# Patient Record
Sex: Male | Born: 1935 | Race: White | Hispanic: No | Marital: Married | Smoking: Former smoker
Health system: Southern US, Community
[De-identification: ages and names within clinical notes are randomized; demographics above are authoritative.]

## PROBLEM LIST (undated history)

## (undated) DIAGNOSIS — R252 Cramp and spasm: Secondary | ICD-10-CM

## (undated) DIAGNOSIS — R600 Localized edema: Secondary | ICD-10-CM

## (undated) DIAGNOSIS — R197 Diarrhea, unspecified: Secondary | ICD-10-CM

## (undated) DIAGNOSIS — N400 Enlarged prostate without lower urinary tract symptoms: Secondary | ICD-10-CM

## (undated) DIAGNOSIS — R42 Dizziness and giddiness: Secondary | ICD-10-CM

## (undated) DIAGNOSIS — Z87440 Personal history of urinary (tract) infections: Secondary | ICD-10-CM

## (undated) DIAGNOSIS — I4819 Other persistent atrial fibrillation: Secondary | ICD-10-CM

## (undated) DIAGNOSIS — M1990 Unspecified osteoarthritis, unspecified site: Secondary | ICD-10-CM

## (undated) DIAGNOSIS — I10 Essential (primary) hypertension: Secondary | ICD-10-CM

## (undated) DIAGNOSIS — D649 Anemia, unspecified: Secondary | ICD-10-CM

## (undated) DIAGNOSIS — M545 Low back pain, unspecified: Secondary | ICD-10-CM

## (undated) DIAGNOSIS — K922 Gastrointestinal hemorrhage, unspecified: Secondary | ICD-10-CM

## (undated) DIAGNOSIS — J189 Pneumonia, unspecified organism: Secondary | ICD-10-CM

## (undated) DIAGNOSIS — R3911 Hesitancy of micturition: Secondary | ICD-10-CM

## (undated) DIAGNOSIS — I499 Cardiac arrhythmia, unspecified: Secondary | ICD-10-CM

## (undated) DIAGNOSIS — C61 Malignant neoplasm of prostate: Secondary | ICD-10-CM

## (undated) DIAGNOSIS — R079 Chest pain, unspecified: Secondary | ICD-10-CM

## (undated) DIAGNOSIS — Z9289 Personal history of other medical treatment: Secondary | ICD-10-CM

## (undated) DIAGNOSIS — G629 Polyneuropathy, unspecified: Secondary | ICD-10-CM

## (undated) DIAGNOSIS — IMO0001 Reserved for inherently not codable concepts without codable children: Secondary | ICD-10-CM

## (undated) DIAGNOSIS — G4733 Obstructive sleep apnea (adult) (pediatric): Secondary | ICD-10-CM

## (undated) DIAGNOSIS — Z87442 Personal history of urinary calculi: Secondary | ICD-10-CM

## (undated) DIAGNOSIS — K219 Gastro-esophageal reflux disease without esophagitis: Secondary | ICD-10-CM

## (undated) DIAGNOSIS — I639 Cerebral infarction, unspecified: Secondary | ICD-10-CM

## (undated) HISTORY — PX: COLONOSCOPY: SHX174

## (undated) HISTORY — DX: Other persistent atrial fibrillation: I48.19

## (undated) HISTORY — DX: Benign prostatic hyperplasia without lower urinary tract symptoms: N40.0

## (undated) HISTORY — DX: Dizziness and giddiness: R42

## (undated) HISTORY — PX: VAGOTOMY AND PYLOROPLASTY: SHX831

## (undated) HISTORY — DX: Cerebral infarction, unspecified: I63.9

## (undated) HISTORY — DX: Anemia, unspecified: D64.9

## (undated) HISTORY — DX: Obstructive sleep apnea (adult) (pediatric): G47.33

## (undated) HISTORY — DX: Gastrointestinal hemorrhage, unspecified: K92.2

## (undated) HISTORY — DX: Low back pain, unspecified: M54.50

## (undated) HISTORY — DX: Chest pain, unspecified: R07.9

## (undated) HISTORY — PX: INGUINAL HERNIA REPAIR: SHX194

## (undated) HISTORY — DX: Essential (primary) hypertension: I10

## (undated) HISTORY — DX: Diarrhea, unspecified: R19.7

## (undated) HISTORY — DX: Low back pain: M54.5

## (undated) HISTORY — PX: CHOLECYSTECTOMY: SHX55

## (undated) HISTORY — PX: OTHER SURGICAL HISTORY: SHX169

---

## 1983-08-22 DIAGNOSIS — K922 Gastrointestinal hemorrhage, unspecified: Secondary | ICD-10-CM

## 1983-08-22 HISTORY — DX: Gastrointestinal hemorrhage, unspecified: K92.2

## 1989-08-21 HISTORY — PX: ROTATOR CUFF REPAIR: SHX139

## 2000-09-04 ENCOUNTER — Encounter: Admission: RE | Admit: 2000-09-04 | Discharge: 2000-09-04 | Payer: Self-pay | Admitting: Orthopaedic Surgery

## 2000-12-24 ENCOUNTER — Ambulatory Visit (HOSPITAL_COMMUNITY): Admission: RE | Admit: 2000-12-24 | Discharge: 2000-12-24 | Payer: Self-pay | Admitting: Pulmonary Disease

## 2002-02-03 ENCOUNTER — Encounter (HOSPITAL_COMMUNITY): Admission: RE | Admit: 2002-02-03 | Discharge: 2002-03-05 | Payer: Self-pay | Admitting: Pulmonary Disease

## 2002-03-07 ENCOUNTER — Ambulatory Visit (HOSPITAL_COMMUNITY): Admission: RE | Admit: 2002-03-07 | Discharge: 2002-03-07 | Payer: Self-pay | Admitting: Pulmonary Disease

## 2002-10-03 ENCOUNTER — Ambulatory Visit (HOSPITAL_COMMUNITY): Admission: RE | Admit: 2002-10-03 | Discharge: 2002-10-03 | Payer: Self-pay | Admitting: Pulmonary Disease

## 2003-02-12 ENCOUNTER — Encounter (HOSPITAL_COMMUNITY): Admission: RE | Admit: 2003-02-12 | Discharge: 2003-03-14 | Payer: Self-pay | Admitting: Pulmonary Disease

## 2003-03-06 ENCOUNTER — Ambulatory Visit (HOSPITAL_COMMUNITY): Admission: RE | Admit: 2003-03-06 | Discharge: 2003-03-06 | Payer: Self-pay | Admitting: *Deleted

## 2003-03-18 ENCOUNTER — Ambulatory Visit (HOSPITAL_COMMUNITY): Admission: RE | Admit: 2003-03-18 | Discharge: 2003-03-18 | Payer: Self-pay | Admitting: *Deleted

## 2003-07-21 ENCOUNTER — Emergency Department (HOSPITAL_COMMUNITY): Admission: AD | Admit: 2003-07-21 | Discharge: 2003-07-21 | Payer: Self-pay | Admitting: Family Medicine

## 2004-05-05 ENCOUNTER — Emergency Department (HOSPITAL_COMMUNITY): Admission: EM | Admit: 2004-05-05 | Discharge: 2004-05-05 | Payer: Self-pay | Admitting: Emergency Medicine

## 2004-09-16 ENCOUNTER — Emergency Department (HOSPITAL_COMMUNITY): Admission: EM | Admit: 2004-09-16 | Discharge: 2004-09-16 | Payer: Self-pay | Admitting: Emergency Medicine

## 2007-02-03 ENCOUNTER — Emergency Department (HOSPITAL_COMMUNITY): Admission: EM | Admit: 2007-02-03 | Discharge: 2007-02-03 | Payer: Self-pay | Admitting: Emergency Medicine

## 2007-09-01 ENCOUNTER — Emergency Department (HOSPITAL_COMMUNITY): Admission: EM | Admit: 2007-09-01 | Discharge: 2007-09-01 | Payer: Self-pay | Admitting: Emergency Medicine

## 2007-09-04 ENCOUNTER — Emergency Department (HOSPITAL_COMMUNITY): Admission: EM | Admit: 2007-09-04 | Discharge: 2007-09-04 | Payer: Self-pay | Admitting: Emergency Medicine

## 2009-05-15 ENCOUNTER — Ambulatory Visit: Payer: Self-pay | Admitting: Cardiology

## 2009-05-15 ENCOUNTER — Inpatient Hospital Stay (HOSPITAL_COMMUNITY): Admission: EM | Admit: 2009-05-15 | Discharge: 2009-05-19 | Payer: Self-pay | Admitting: Emergency Medicine

## 2009-06-10 ENCOUNTER — Ambulatory Visit: Payer: Self-pay | Admitting: Gastroenterology

## 2009-06-10 DIAGNOSIS — R197 Diarrhea, unspecified: Secondary | ICD-10-CM

## 2009-06-10 DIAGNOSIS — Z8719 Personal history of other diseases of the digestive system: Secondary | ICD-10-CM | POA: Insufficient documentation

## 2009-06-10 DIAGNOSIS — Z8711 Personal history of peptic ulcer disease: Secondary | ICD-10-CM | POA: Insufficient documentation

## 2009-06-10 DIAGNOSIS — K921 Melena: Secondary | ICD-10-CM | POA: Insufficient documentation

## 2009-06-10 DIAGNOSIS — D649 Anemia, unspecified: Secondary | ICD-10-CM | POA: Insufficient documentation

## 2009-06-10 DIAGNOSIS — I10 Essential (primary) hypertension: Secondary | ICD-10-CM

## 2009-06-10 DIAGNOSIS — Z87898 Personal history of other specified conditions: Secondary | ICD-10-CM

## 2009-06-10 DIAGNOSIS — M545 Low back pain: Secondary | ICD-10-CM

## 2009-06-11 ENCOUNTER — Encounter: Payer: Self-pay | Admitting: Gastroenterology

## 2009-06-17 ENCOUNTER — Encounter: Payer: Self-pay | Admitting: Gastroenterology

## 2009-06-17 ENCOUNTER — Telehealth: Payer: Self-pay | Admitting: Gastroenterology

## 2009-06-17 ENCOUNTER — Ambulatory Visit (HOSPITAL_COMMUNITY): Admission: RE | Admit: 2009-06-17 | Discharge: 2009-06-17 | Payer: Self-pay | Admitting: Gastroenterology

## 2009-06-17 ENCOUNTER — Ambulatory Visit: Payer: Self-pay | Admitting: Gastroenterology

## 2009-06-17 HISTORY — PX: ESOPHAGOGASTRODUODENOSCOPY: SHX1529

## 2009-06-17 HISTORY — PX: COLONOSCOPY WITH ESOPHAGOGASTRODUODENOSCOPY (EGD): SHX5779

## 2009-06-22 ENCOUNTER — Encounter: Payer: Self-pay | Admitting: Gastroenterology

## 2010-06-16 ENCOUNTER — Encounter: Payer: Self-pay | Admitting: Orthopedic Surgery

## 2010-06-16 ENCOUNTER — Ambulatory Visit (HOSPITAL_COMMUNITY)
Admission: RE | Admit: 2010-06-16 | Discharge: 2010-06-16 | Payer: Self-pay | Source: Home / Self Care | Admitting: Orthopaedic Surgery

## 2010-08-30 ENCOUNTER — Encounter: Payer: Self-pay | Admitting: Orthopedic Surgery

## 2010-08-31 ENCOUNTER — Ambulatory Visit
Admission: RE | Admit: 2010-08-31 | Discharge: 2010-08-31 | Payer: Self-pay | Source: Home / Self Care | Attending: Orthopedic Surgery | Admitting: Orthopedic Surgery

## 2010-08-31 DIAGNOSIS — M75120 Complete rotator cuff tear or rupture of unspecified shoulder, not specified as traumatic: Secondary | ICD-10-CM | POA: Insufficient documentation

## 2010-09-06 ENCOUNTER — Encounter (HOSPITAL_COMMUNITY)
Admission: RE | Admit: 2010-09-06 | Discharge: 2010-09-20 | Payer: Self-pay | Source: Home / Self Care | Attending: Orthopedic Surgery | Admitting: Orthopedic Surgery

## 2010-09-22 NOTE — Letter (Signed)
Summary: History form  History form   Imported By: Ruffin Pyo 09/07/2010 09:38:19  _____________________________________________________________________  External Attachment:    Type:   Image     Comment:   External Document

## 2010-09-22 NOTE — Assessment & Plan Note (Signed)
Summary: RT SHOULDER PAIN/S/P FALL 1 M/NEEDS XR/[facility] [employer] [facility]/CAF   Vital Signs:  Patient profile:   77 year old male Height:      72 inches Weight:      260 pounds Pulse rate:   80 / minute Resp:     18 per minute  Vitals Entered By: Arther Abbott MD (August 31, 2010 3:41 PM)  Visit Type:  new patient Referring Provider:  ap [facility] Primary Provider:  Dr. Susann Givens  CC:  right shoulder pain.  History of Present Illness: I saw Bernard Ramirez in the office today for an initial visit.  He is a 77 years old man with the complaint of:  right shoulder pain.  Fell 06/09/10.  No xrays.  Has had Rotator cuff repair on this shoulder before [facility], recovered fully. Also had left shoulder surgery.  MRI right shoulder taken [facility] 06/16/10 for review.  Meds: Vicodin 5 for pain.  He reinjured himself in October and was seen by a local orthopedist, who got an MRI of his shoulder, and it showed that he had a subscapularis tear with retraction, as well as a supraspinatus tear with supraspinatus atrophy.  He complains it is hard for him to shift his tears, although is able to drive his 18 wheeler, and it's hard for him to get as close he can deal with that.     Allergies: 1)  ! Asa 2)  ! 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 Quit 2004. 25PKYR hx Alcohol Use - no Illicit Drug Use - no 2 coffees per day 10th grade ed Patient does not get regular exercise.   Review of Systems Constitutional:  Denies weight loss, weight gain, fever, chills, and fatigue. Cardiovascular:  Denies chest pain, palpitations, fainting, and murmurs. Respiratory:  Denies short of breath, wheezing, couch, tightness, pain on inspiration, and snoring . Gastrointestinal:  Denies heartburn, nausea, vomiting, diarrhea, constipation, and blood in your stools. Genitourinary:  Denies frequency, urgency, difficulty  urinating, painful urination, flank pain, and bleeding in urine. Neurologic:  Denies numbness, tingling, unsteady gait, dizziness, tremors, and seizure. Musculoskeletal:  Denies joint pain, swelling, instability, stiffness, redness, heat, and muscle pain. Endocrine:  Denies excessive thirst, exessive urination, and heat or cold intolerance. Psychiatric:  Denies nervousness, depression, anxiety, and hallucinations. Skin:  Denies changes in the skin, poor healing, rash, itching, and redness. HEENT:  Denies blurred or double vision, eye pain, redness, and watering. Immunology:  Denies seasonal allergies, sinus problems, and allergic to bee stings. Hemoatologic:  Denies easy bleeding and brusing.  Physical Exam  Skin:  intact without lesions or rashes Cervical Nodes:  no significant adenopathy Psych:  alert and cooperative; normal mood and affect; normal attention span and concentration   Shoulder/Elbow Exam  General:    Well-developed, well-nourished, normal body habitus; no deformities, normal grooming.    Skin:    Intact, no scars, lesions, rashes, cafe au lait spots or bruising.    Inspection:    Inspection is normal.    Palpation:    his primary tenderness is over the anterior lateral deltoid and anterior shoulder joint  Vascular:    Radial, ulnar, brachial, and axillary pulses 2+ and symmetric; capillary refill less than 2 seconds; no evidence of ischemia, clubbing, or cyanosis.    Sensory:    Gross sensation intact in the upper extremities.    Motor:  he has weakness in internal rotation, as well as forward elevation on the RIGHT side  Reflexes:    Normal reflexes in the upper extremities.    Shoulder Exam:    Right:    Stability:  stable    Inspection/Palpation:  active range of motion equals flexion of 110  External rotation 45    Left:    Inspection:  Normal    Palpation:  Normal    Stability:  stable   Impression & Recommendations:  Problem # 1:   ROTATOR CUFF TEAR (ICD-727.61) Assessment New  The MRI was done at [facility] and it was reviewed with the report  he does have atrophy of the supraspinatus. I think he has had that tear for quite a while. The subscapularis tear seems to be more subacute and may have occurred in October.  However, he is functioning at this time, and I have advised him to go to therapy because the tear is now old and he does not have a good chance of getting a good functional shoulder with surgery,.  We'll try therapy 1st and if he does well we can stop at that point.  they are agreeable to this plan  Orders: Physical Therapy Referral (PT) New Patient Level III [id number]) Shoulder x-ray,  minimum 2 views [id number]) RIGHT shoulder Separate and Identifiable X-Ray report      Normal glenohumeral joint space. Large subacromial spur.  Impression large acromial spur with normal glenohumeral joint. No bone abnormalities and no acute process seen.  Patient Instructions: 1)  Subscapularous tear and RCT needs PT  2)  early transition to home program 3)  Come back in 6 weeks   Orders Added: 1)  Physical Therapy Referral [PT] 2)  New Patient Level III [id number] 3)  Shoulder x-ray,  minimum 2 views [id number]

## 2010-09-28 ENCOUNTER — Encounter: Payer: Self-pay | Admitting: Orthopedic Surgery

## 2010-10-06 NOTE — Miscellaneous (Signed)
Summary: OT clinical evaluation  OT clinical evaluation   Imported By: Ruffin Pyo 09/28/2010 15:06:47  _____________________________________________________________________  External Attachment:    Type:   Image     Comment:   External Document

## 2010-10-12 ENCOUNTER — Ambulatory Visit: Payer: Self-pay | Admitting: Orthopedic Surgery

## 2010-10-27 ENCOUNTER — Encounter: Payer: Self-pay | Admitting: Orthopedic Surgery

## 2010-11-08 NOTE — Miscellaneous (Signed)
Summary: OT now show  OT now show   Imported By: Ruffin Pyo 11/02/2010 08:00:34  _____________________________________________________________________  External Attachment:    Type:   Image     Comment:   External Document

## 2010-11-24 LAB — TSH: TSH: 2.995 u[IU]/mL (ref 0.350–4.500)

## 2010-11-24 LAB — BASIC METABOLIC PANEL
Calcium: 9 mg/dL (ref 8.4–10.5)
Creatinine, Ser: 0.84 mg/dL (ref 0.4–1.5)
GFR calc Af Amer: >60 mL/min (ref >60)
Sodium: 136 mEq/L (ref 135–145)

## 2010-11-25 LAB — CBC
HCT: 31.5 % — ABNORMAL LOW (ref 39.0–52.0)
Hemoglobin: 10.2 g/dL — ABNORMAL LOW (ref 13.0–17.0)
MCHC: 32.3 g/dL (ref 30.0–36.0)
MCV: 77 fL — ABNORMAL LOW (ref 78.0–100.0)
RBC: 4.67 MIL/uL (ref 4.22–5.81)
RDW: 16.4 % — ABNORMAL HIGH (ref 11.5–15.5)
WBC: 8.1 10*3/uL (ref 4.0–10.5)

## 2010-11-25 LAB — LIPID PANEL
Cholesterol: 179 mg/dL (ref 0–200)
LDL Cholesterol: 107 mg/dL — ABNORMAL HIGH (ref 0–99)

## 2010-11-25 LAB — COMPREHENSIVE METABOLIC PANEL
ALT: 17 U/L (ref 0–53)
AST: 23 U/L (ref 0–37)
Alkaline Phosphatase: 91 U/L (ref 39–117)
CO2: 27 mEq/L (ref 19–32)
Chloride: 104 mEq/L (ref 96–112)
GFR calc Af Amer: >60 mL/min (ref >60)
GFR calc non Af Amer: >60 mL/min (ref >60)
Potassium: 3.8 mEq/L (ref 3.5–5.1)
Sodium: 139 mEq/L (ref 135–145)
Total Bilirubin: 0.5 mg/dL (ref 0.3–1.2)

## 2010-11-25 LAB — HEPATIC FUNCTION PANEL
ALT: 14 U/L (ref 0–53)
AST: 18 U/L (ref 0–37)
Albumin: 3 g/dL — ABNORMAL LOW (ref 3.5–5.2)
Bilirubin, Direct: <0.1 mg/dL (ref 0.0–0.3)
Bilirubin, Direct: <0.1 mg/dL (ref 0.0–0.3)
Indirect Bilirubin: 0.4 mg/dL (ref 0.3–0.9)
Total Bilirubin: 0.4 mg/dL (ref 0.3–1.2)
Total Bilirubin: 0.5 mg/dL (ref 0.3–1.2)

## 2010-11-25 LAB — DIFFERENTIAL
Basophils Absolute: 0 10*3/uL (ref 0.0–0.1)
Basophils Relative: 0 % (ref 0–1)
Eosinophils Absolute: 0.1 10*3/uL (ref 0.0–0.7)
Eosinophils Relative: 2 % (ref 0–5)
Eosinophils Relative: 3 % (ref 0–5)
Lymphs Abs: 1.3 10*3/uL (ref 0.7–4.0)
Monocytes Absolute: 0.7 10*3/uL (ref 0.1–1.0)

## 2010-11-25 LAB — POCT CARDIAC MARKERS: Troponin i, poc: <0.05 ng/mL (ref 0.00–0.09)

## 2010-11-25 LAB — CARDIAC PANEL(CRET KIN+CKTOT+MB+TROPI)
CK, MB: 1.2 ng/mL (ref 0.3–4.0)
Troponin I: 0.02 ng/mL (ref 0.00–0.06)

## 2011-01-06 NOTE — Cardiovascular Report (Signed)
NAME:  Bernard Ramirez, Bernard Ramirez NO.:  [id number]   MEDICAL RECORD NO.:  [id number]                   PATIENT TYPE:  OIB   LOCATION:  2899                                 FACILITY:  [facility]   PHYSICIAN:  Scarlett Presto, M.D.                DATE OF BIRTH:  1930-11-15   DATE OF PROCEDURE:  03/18/2003  DATE OF DISCHARGE:                              CARDIAC CATHETERIZATION   PRIMARY CARE Tieisha Darden:  Jasper Loser. Luan Pulling, M.D. in [place], [place]   HISTORY OF PRESENT ILLNESS:  Mr. Salvia is a 77 year old gentleman with  significant cardiac risk factors of hypertension and hyperlipidemia, distant  tobacco history who presents to see me after having an outpatient perfusion  study which showed evidence of inferior wall scar, and he was referred for  elective heart catheterization via the radial approach.   PROCEDURE(S) PERFORMED:  1. Left heart catheterization.  2. Selective coronary angiography.  3. Left ventriculography, all via the right radial approach.   DETAILS OF PROCEDURE:  Following obtaining informed consent, the patient was  brought to the cardiac catheterization laboratory in the fasting state.  There he was prepped and draped in the usual sterile manner, and the right  wrist was anesthetized using 1% lidocaine without epinephrine.  The right  radial artery was cannulated using modified Seldinger technique with a 6-  French 10 cm sheath and left heart catheterization was performed using a 6-  French Judkins left #4, a 6-French Judkins right #4 and a pigtail catheter.  Left ventriculography was performed in the 30 degree RAO view.  At the  conclusion of the procedure, the catheters were removed, the sheath was  removed.  The Radstat device was placed on the wrist and distal perfusion  was assessed using pulse oximetry.  There were no complications.   RESULTS:  Aortic pressure:  161/93 with a mean arterial pressure of 121  mmHg.  Left ventricular pressure:   161/16 with an end-diastolic pressure of 28  mmHg.   SELECTIVE CORONARY ANGIOGRAPHY:  LEFT MAIN CORONARY ARTERY:  The left main  coronary artery is a moderate to large caliber vessel which is  angiographically normal.  CIRCUMFLEX CORONARY ARTERY:  The circumflex coronary artery is a large  dominant vessel which has five obtuse marginals, a posterior descending  coronary artery and a posterior lateral branch all of which are moderate  caliber and are angiographically free of disease.  LEFT ANTERIOR DESCENDING CORONARY ARTERY:  The left anterior descending  coronary artery is a large transapical vessel with four diagonal branches,  two of which are moderate in caliber and two of which are small.  The LAD is  completely free of disease.  RIGHT CORONARY ARTERY:  The right coronary artery is a small, nondominant  artery with a 25% stenosis in its proximal portion.  LEFT VENTRICULOGRAM:  The left ventriculogram reveals normal left  ventricular function with an estimated ejection  fraction of 60%.  No wall  motion abnormalities are noted.  No mitral regurgitation.    ASSESSMENT:  This is a false-positive perfusion study with evidence of  normal coronary arteries.  He has hypertension and this needs to be  aggressively treated.   PLAN:  Our plan is for medical therapy for his hypertension and risk factor  modification.                                               Scarlett Presto, M.D.    JH/MEDQ  D:  03/18/2003  T:  03/18/2003  Job:  [id number]

## 2011-01-06 NOTE — Procedures (Signed)
   NAME:  BOURNE, DRABIK NO.:  [id number]   MEDICAL RECORD NO.:  [id number]                   PATIENT TYPE:  PREC   LOCATION:                                       FACILITY:  [facility]   PHYSICIAN:  Edward L. Luan Pulling, M.D.             DATE OF BIRTH:  [birth year]   DATE OF PROCEDURE:  02/12/2003  DATE OF DISCHARGE:  10/03/2002                                    STRESS TEST   PROCEDURE:  Stress test.   INDICATIONS FOR PROCEDURE:  Bernard Ramirez has been having shortness of breath  and chest pain.  He is undergoing graded exercise testing to rule out  ischemic cardiac disease. There are no contraindications to graded exercise  testing.   DESCRIPTION OF PROCEDURE:  Bernard Ramirez exercised for 5 minutes and 20 seconds  on the Bruce Protocol reaching, and sustaining, 7.0 METS.  He stopped  exercise because of shortness of breath and fatigue.  He had no chest pain  during exercise.   Cardiolite was injected per protocol; and he exercised for 1 minute after  Cardiolite injection.  Blood pressure response to exercise was normal.  He  had a moderately elevated blood pressure at rest.  There were no  electrocardiographic changes diagnostic for inducible ischemia.   IMPRESSION:  1. Poor exercise tolerance.  2. Normal blood pressure response to exercise.  3. No chest pain with exercise. Exercise terminated due to dyspnea.  4. No evidence of a digital ischemia.  5. Cardiolite images are pending.                                               Edward L. Luan Pulling, M.D.    ELH/MEDQ  D:  02/12/2003  T:  02/13/2003  Job:  [id number]

## 2011-05-01 ENCOUNTER — Other Ambulatory Visit (HOSPITAL_COMMUNITY): Payer: Self-pay | Admitting: Urology

## 2011-05-01 DIAGNOSIS — N400 Enlarged prostate without lower urinary tract symptoms: Secondary | ICD-10-CM

## 2011-05-03 ENCOUNTER — Ambulatory Visit (HOSPITAL_COMMUNITY)
Admission: RE | Admit: 2011-05-03 | Discharge: 2011-05-03 | Disposition: A | Discharge status: Home / Self Care | Payer: Medicare Other | Source: Ambulatory Visit | Attending: Urology | Admitting: Urology

## 2011-05-03 DIAGNOSIS — R310 Gross hematuria: Secondary | ICD-10-CM | POA: Insufficient documentation

## 2011-05-03 DIAGNOSIS — K4090 Unilateral inguinal hernia, without obstruction or gangrene, not specified as recurrent: Secondary | ICD-10-CM | POA: Insufficient documentation

## 2011-05-03 DIAGNOSIS — N400 Enlarged prostate without lower urinary tract symptoms: Secondary | ICD-10-CM | POA: Insufficient documentation

## 2011-05-03 DIAGNOSIS — K5730 Diverticulosis of large intestine without perforation or abscess without bleeding: Secondary | ICD-10-CM | POA: Insufficient documentation

## 2011-05-03 DIAGNOSIS — N200 Calculus of kidney: Secondary | ICD-10-CM | POA: Insufficient documentation

## 2011-05-10 LAB — CULTURE, ROUTINE-ABSCESS

## 2011-06-07 LAB — URINALYSIS, ROUTINE W REFLEX MICROSCOPIC
Bilirubin Urine: NEGATIVE
Glucose, UA: NEGATIVE
Ketones, ur: NEGATIVE
Leukocytes, UA: NEGATIVE
Nitrite: NEGATIVE
Protein, ur: NEGATIVE

## 2011-08-22 DIAGNOSIS — I4819 Other persistent atrial fibrillation: Secondary | ICD-10-CM

## 2011-08-22 HISTORY — DX: Other persistent atrial fibrillation: I48.19

## 2011-12-04 ENCOUNTER — Other Ambulatory Visit (HOSPITAL_COMMUNITY): Payer: Self-pay | Admitting: Urology

## 2011-12-04 DIAGNOSIS — R310 Gross hematuria: Secondary | ICD-10-CM

## 2011-12-05 ENCOUNTER — Ambulatory Visit (HOSPITAL_COMMUNITY)
Admission: RE | Admit: 2011-12-05 | Discharge: 2011-12-05 | Disposition: A | Discharge status: Home / Self Care | Payer: Medicare Other | Source: Ambulatory Visit | Attending: Urology | Admitting: Urology

## 2011-12-05 DIAGNOSIS — N200 Calculus of kidney: Secondary | ICD-10-CM | POA: Insufficient documentation

## 2011-12-05 DIAGNOSIS — R319 Hematuria, unspecified: Secondary | ICD-10-CM | POA: Insufficient documentation

## 2011-12-05 DIAGNOSIS — N400 Enlarged prostate without lower urinary tract symptoms: Secondary | ICD-10-CM | POA: Insufficient documentation

## 2011-12-05 DIAGNOSIS — R1032 Left lower quadrant pain: Secondary | ICD-10-CM | POA: Insufficient documentation

## 2011-12-05 DIAGNOSIS — R310 Gross hematuria: Secondary | ICD-10-CM

## 2011-12-05 DIAGNOSIS — K5730 Diverticulosis of large intestine without perforation or abscess without bleeding: Secondary | ICD-10-CM | POA: Insufficient documentation

## 2012-06-24 ENCOUNTER — Emergency Department (HOSPITAL_COMMUNITY): Payer: Medicare Other

## 2012-06-24 ENCOUNTER — Emergency Department (HOSPITAL_COMMUNITY)
Admission: EM | Admit: 2012-06-24 | Discharge: 2012-06-24 | Disposition: A | Discharge status: Home / Self Care | Payer: Medicare Other | Attending: Emergency Medicine | Admitting: Emergency Medicine

## 2012-06-24 ENCOUNTER — Encounter (HOSPITAL_COMMUNITY): Payer: Self-pay | Admitting: Emergency Medicine

## 2012-06-24 DIAGNOSIS — R112 Nausea with vomiting, unspecified: Secondary | ICD-10-CM | POA: Insufficient documentation

## 2012-06-24 DIAGNOSIS — Z79899 Other long term (current) drug therapy: Secondary | ICD-10-CM | POA: Insufficient documentation

## 2012-06-24 DIAGNOSIS — R42 Dizziness and giddiness: Secondary | ICD-10-CM | POA: Insufficient documentation

## 2012-06-24 DIAGNOSIS — R5381 Other malaise: Secondary | ICD-10-CM | POA: Insufficient documentation

## 2012-06-24 LAB — CBC WITH DIFFERENTIAL/PLATELET
Basophils Absolute: 0 10*3/uL (ref 0.0–0.1)
Eosinophils Relative: 0 % (ref 0–5)
HCT: 38.9 % — ABNORMAL LOW (ref 39.0–52.0)
Hemoglobin: 12 g/dL — ABNORMAL LOW (ref 13.0–17.0)
Lymphocytes Relative: 9 % — ABNORMAL LOW (ref 12–46)
Lymphs Abs: 0.8 10*3/uL (ref 0.7–4.0)
MCV: 79.4 fL (ref 78.0–100.0)
Monocytes Absolute: 0.4 10*3/uL (ref 0.1–1.0)
Neutro Abs: 7.9 10*3/uL — ABNORMAL HIGH (ref 1.7–7.7)
RBC: 4.9 MIL/uL (ref 4.22–5.81)
WBC: 9.1 10*3/uL (ref 4.0–10.5)

## 2012-06-24 LAB — COMPREHENSIVE METABOLIC PANEL
ALT: 12 U/L (ref 0–53)
AST: 21 U/L (ref 0–37)
CO2: 27 mEq/L (ref 19–32)
Calcium: 9.9 mg/dL (ref 8.4–10.5)
Chloride: 101 mEq/L (ref 96–112)
Creatinine, Ser: 0.75 mg/dL (ref 0.50–1.35)
GFR calc Af Amer: >90 mL/min (ref >90)
GFR calc non Af Amer: 85 mL/min — ABNORMAL LOW (ref >90)
Glucose, Bld: 137 mg/dL — ABNORMAL HIGH (ref 70–99)
Total Bilirubin: 0.6 mg/dL (ref 0.3–1.2)

## 2012-06-24 MED ORDER — ONDANSETRON 4 MG PO TBDP: 4.0000 mg | ORAL_TABLET | Freq: Three times a day (TID) | ORAL | Status: DC | PRN

## 2012-06-24 MED ORDER — ONDANSETRON HCL 4 MG/2ML IJ SOLN
4.0000 mg | Freq: Once | INTRAMUSCULAR | Status: AC
  Administered 2012-06-24: 4 mg via INTRAVENOUS
  Filled 2012-06-24: qty 2

## 2012-06-24 MED ORDER — MECLIZINE HCL 12.5 MG PO TABS
25.0000 mg | ORAL_TABLET | Freq: Once | ORAL | Status: AC
  Administered 2012-06-24: 25 mg via ORAL
  Filled 2012-06-24: qty 2

## 2012-06-24 MED ORDER — MECLIZINE HCL 25 MG PO TABS: ORAL_TABLET | ORAL | Status: DC

## 2012-06-24 NOTE — ED Provider Notes (Signed)
History   This chart was scribed for Bernard Diego, MD by Marin Comment . The patient was seen in room APA07/APA07. Patient's care was started at 1227.   CSN: [id number]  Arrival date & time 06/24/12  1049   First MD Initiated Contact with Patient 06/24/12 1227      Chief Complaint  Patient presents with  . Dizziness  . Nausea  . Emesis    HPI Comments: Bernard Ramirez is a 78 y.o. male who presents to the Emergency Department complaining of spinning sensation dizziness with associated generalized weakness, nausea and vomiting. He states his vertigo is worse with positional changes and lasts a few seconds at a time. Wife reports he vomited green bile. He denies any recent illness. She states she checked his BP at home which was 170/105. She states he takes Hydrocodone for pain and lasix. He denies any h/o HTN or DM.  Patient is a 78 y.o. male presenting with weakness. The history is provided by the patient. No language interpreter was used.  Weakness The primary symptoms include dizziness, nausea and vomiting. Primary symptoms do not include headaches or seizures. The symptoms began 2 to 6 hours ago. The symptoms are worsening. The neurological symptoms are diffuse. Context: upon waking.  He describes the dizziness as a sensation of spinning. Dizziness also occurs with nausea, vomiting and weakness.  Additional symptoms include weakness. Additional symptoms do not include hallucinations.    History reviewed. No pertinent past medical history.  Past Surgical History  Procedure Date  . Shoulder surgery   . Cholecystectomy   . Stomach surgery     No family history on file.  History  Substance Use Topics  . Smoking status: Never Smoker   . Smokeless tobacco: Not on file  . Alcohol Use: Yes     Comment: rare      Review of Systems  Constitutional: Negative for fatigue.  HENT: Negative for congestion, sinus pressure and ear discharge.   Eyes: Negative for  discharge.  Respiratory: Negative for cough.   Cardiovascular: Negative for chest pain.  Gastrointestinal: Positive for nausea and vomiting. Negative for abdominal pain and diarrhea.  Genitourinary: Negative for frequency and hematuria.  Musculoskeletal: Negative for back pain.  Skin: Negative for rash.  Neurological: Positive for dizziness and weakness. Negative for seizures and headaches.  Hematological: Negative.   Psychiatric/Behavioral: Negative for hallucinations.  All other systems reviewed and are negative.    Allergies  Contrast media and Aspirin  Home Medications   Current Outpatient Rx  Name  Route  Sig  Dispense  Refill  . FUROSEMIDE 40 MG PO TABS   Oral   Take 40 mg by mouth daily as needed. Swelling.         Marland Kitchen GABAPENTIN 300 MG PO CAPS   Oral   Take 300 mg by mouth daily.         Marland Kitchen HYDROCODONE-ACETAMINOPHEN 5-325 MG PO TABS   Oral   Take 1 tablet by mouth every 6 (six) hours as needed. Pain.           BP 152/85  Pulse 62  Resp 13  Ht 6' (1.829 m)  Wt 265 lb (120.203 kg)  BMI 35.94 kg/m2  SpO2 95%  Physical Exam  Nursing note and vitals reviewed. Constitutional: He is oriented to person, place, and time. He appears well-developed.  HENT:  Head: Normocephalic and atraumatic.  Eyes: Conjunctivae normal and EOM are normal. No scleral icterus.  Neck:  Neck supple. No thyromegaly present.  Cardiovascular: Normal rate, regular rhythm and normal heart sounds.  Exam reveals no gallop and no friction rub.   No murmur heard. Pulmonary/Chest: Effort normal and breath sounds normal. No stridor. No respiratory distress. He has no wheezes. He has no rales. He exhibits no tenderness.  Abdominal: Soft. He exhibits no distension. There is no tenderness. There is no rebound.  Musculoskeletal: Normal range of motion. He exhibits edema. He exhibits no tenderness.       1+ edema in ankles bilaterally.   Lymphadenopathy:    He has no cervical adenopathy.    Neurological: He is oriented to person, place, and time. Coordination normal.  Skin: No rash noted. No erythema.  Psychiatric: He has a normal mood and affect. His behavior is normal.    ED Course  Procedures (including critical care time)  DIAGNOSTIC STUDIES: Oxygen Saturation is 92% on room air, adequate by my interpretation.    COORDINATION OF CARE:  12:40-Discussed planned course of treatment with the patient including Antivert, Zofran, chest x-ray, CT of head and blood work, who is agreeable at this time.   12:45-Medication Orders: Ondansetron (Zofran) injection 4 mg-once; Meclizine (Antivert) tablet 25 mg-once  Labs Reviewed  CBC WITH DIFFERENTIAL - Abnormal; Notable for the following:    Hemoglobin 12.0 (*)     HCT 38.9 (*)     MCH 24.5 (*)     RDW 16.5 (*)     Neutrophils Relative 86 (*)     Neutro Abs 7.9 (*)     Lymphocytes Relative 9 (*)     All other components within normal limits  COMPREHENSIVE METABOLIC PANEL - Abnormal; Notable for the following:    Glucose, Bld 137 (*)     GFR calc non Af Amer 85 (*)     All other components within normal limits  TROPONIN I   Ct Head Wo Contrast  06/24/2012  *RADIOLOGY REPORT*  Clinical Data: Dizziness.  Nausea and vomiting.  CT HEAD WITHOUT CONTRAST  Technique:  Contiguous axial images were obtained from the base of the skull through the vertex without contrast.  Comparison: None.  Findings: The brain shows mild generalized age related atrophy. There is an old right posterior frontal cortical and subcortical infarction.  There are a few old small vessel white matter infarctions.  No sign of acute infarction, mass lesion, hemorrhage, hydrocephalus or extra-axial collection.  Sinuses are clear.  No calvarial abnormality.  IMPRESSION: No identifiably acute finding.  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  Chronic small vessel change of the hemispheric white matter.   Original Report Authenticated By: Nelson Chimes, M.D.    Dg Chest  Portable 1 View  06/24/2012  *RADIOLOGY REPORT*  Clinical Data: Dizziness, nausea, vomiting.  PORTABLE CHEST - 1 VIEW  Comparison: 05/15/2009  Findings: Stable mild elevation of the right hemidiaphragm.  Mild cardiomegaly.  Bibasilar atelectasis.  No effusions.  No acute bony abnormality.  Mild vascular congestion.  IMPRESSION: Cardiomegaly, vascular congestion.  Bibasilar atelectasis.   Original Report Authenticated By: Rolm Baptise, M.D.      No diagnosis found.    MDM    The chart was scribed for me under my direct supervision.  I personally performed the history, physical, and medical decision making and all procedures in the evaluation of this patient.Bernard Diego, MD 06/24/12 [phone]

## 2012-06-24 NOTE — ED Notes (Signed)
Pt c/o waking up with dizziness/n/v

## 2012-06-24 NOTE — ED Notes (Addendum)
Patient c/o "room spinning" when he got up around 9 a.m. Today.  Nausea/vomitting associated.  Vertigo is intermittent, worse w/positional changes and lasts a few seconds at a time. Disequilibrium present at home.  Slight nystagmus L gaze.  EOM causes nausea/vomiting as well has turning head side-to-side.  Patient is in Afib w/slow response, but he does not recollect ever being told he had an arrythmia.  He does report hx of having palpitations on occasion.  No c/o CP, tightness or SOB.  Wife took BP at home which was elevated 170's/90's.  3+ edema bilat. LEs

## 2012-06-24 NOTE — ED Notes (Signed)
Patient with no complaints at this time. Respirations even and unlabored. Skin warm/dry. Discharge instructions reviewed with patient at this time. Patient given opportunity to voice concerns/ask questions. IV removed per policy and band-aid applied to site. Patient discharged at this time and left Emergency Department with steady gait.  

## 2012-06-26 ENCOUNTER — Ambulatory Visit (INDEPENDENT_AMBULATORY_CARE_PROVIDER_SITE_OTHER): Payer: Medicare Other | Admitting: Cardiology

## 2012-06-26 ENCOUNTER — Ambulatory Visit (HOSPITAL_COMMUNITY)
Admission: RE | Admit: 2012-06-26 | Discharge: 2012-06-26 | Disposition: A | Discharge status: Home / Self Care | Payer: Medicare Other | Source: Ambulatory Visit | Attending: Pulmonary Disease | Admitting: Pulmonary Disease

## 2012-06-26 ENCOUNTER — Encounter: Payer: Self-pay | Admitting: Cardiology

## 2012-06-26 VITALS — BP 130/80 | HR 61 | Ht 72.0 in | Wt 267.1 lb

## 2012-06-26 DIAGNOSIS — R197 Diarrhea, unspecified: Secondary | ICD-10-CM

## 2012-06-26 DIAGNOSIS — K921 Melena: Secondary | ICD-10-CM

## 2012-06-26 DIAGNOSIS — Z87898 Personal history of other specified conditions: Secondary | ICD-10-CM

## 2012-06-26 DIAGNOSIS — I4819 Other persistent atrial fibrillation: Secondary | ICD-10-CM | POA: Insufficient documentation

## 2012-06-26 DIAGNOSIS — I517 Cardiomegaly: Secondary | ICD-10-CM

## 2012-06-26 DIAGNOSIS — R079 Chest pain, unspecified: Secondary | ICD-10-CM | POA: Insufficient documentation

## 2012-06-26 DIAGNOSIS — I10 Essential (primary) hypertension: Secondary | ICD-10-CM | POA: Insufficient documentation

## 2012-06-26 DIAGNOSIS — Z7901 Long term (current) use of anticoagulants: Secondary | ICD-10-CM

## 2012-06-26 DIAGNOSIS — I4821 Permanent atrial fibrillation: Secondary | ICD-10-CM | POA: Insufficient documentation

## 2012-06-26 DIAGNOSIS — R42 Dizziness and giddiness: Secondary | ICD-10-CM | POA: Insufficient documentation

## 2012-06-26 DIAGNOSIS — Z8719 Personal history of other diseases of the digestive system: Secondary | ICD-10-CM

## 2012-06-26 DIAGNOSIS — I4891 Unspecified atrial fibrillation: Secondary | ICD-10-CM

## 2012-06-26 DIAGNOSIS — Z8711 Personal history of peptic ulcer disease: Secondary | ICD-10-CM

## 2012-06-26 DIAGNOSIS — D649 Anemia, unspecified: Secondary | ICD-10-CM

## 2012-06-26 MED ORDER — RIVAROXABAN 20 MG PO TABS: 20.0000 mg | ORAL_TABLET | Freq: Every day | ORAL | Status: DC

## 2012-06-26 NOTE — Progress Notes (Signed)
*  PRELIMINARY RESULTS* Echocardiogram 2D Echocardiogram has been performed.  Tera Partridge 06/26/2012, 12:01 PM

## 2012-06-26 NOTE — Progress Notes (Deleted)
Name: Bernard Ramirez    DOB: 08-15-1931  Age: 78 y.o.  MR#: [id number]       PCP:  Alonza Bogus, MD      Insurance: @PAYORNAME @   CC:    Chief Complaint  Patient presents with  . Office Visit    Afib.     VS BP 130/80  Pulse 61  Ht 6' (1.829 m)  Wt 267 lb 1.3 oz (121.147 kg)  BMI 36.22 kg/m2  SpO2 95%  Weights Current Weight  06/26/12 267 lb 1.3 oz (121.147 kg)  06/24/12 265 lb (120.203 kg)  08/31/10 260 lb (117.935 kg)    Blood Pressure  BP Readings from Last 3 Encounters:  06/26/12 130/80  06/24/12 152/85  06/10/09 118/72     Admit date:  (Not on file) Last encounter with RMR:  Visit date not found   Allergy Allergies  Allergen Reactions  . Contrast Media (Iodinated Diagnostic Agents)     Pt states he was given "dye" 20 yrs ago, anaphylaxis - was told to never have it again   . Aspirin     Current Outpatient Prescriptions  Medication Sig Dispense Refill  . furosemide (LASIX) 40 MG tablet Take 40 mg by mouth daily as needed. Swelling.      . gabapentin (NEURONTIN) 300 MG capsule Take 300 mg by mouth daily.      Marland Kitchen HYDROcodone-acetaminophen (NORCO/VICODIN) 5-325 MG per tablet Take 1 tablet by mouth every 6 (six) hours as needed. Pain.      . meclizine (ANTIVERT) 25 MG tablet Take one every 6 hours for dizziness  28 tablet  0  . ondansetron (ZOFRAN ODT) 4 MG disintegrating tablet Take 1 tablet (4 mg total) by mouth every 8 (eight) hours as needed for nausea.  20 tablet  0    Discontinued Meds:   There are no discontinued medications.  Patient Active Problem List  Diagnosis  . ANEMIA  . HYPERTENSION  . HEMOCCULT POSITIVE STOOL  . BACK PAIN, LUMBAR, CHRONIC  . DIARRHEA, CHRONIC  . PEPTIC ULCER, ACUTE, HEMORRHAGE, HX OF  . DIVERTICULITIS, HX OF  . BENIGN PROSTATIC HYPERTROPHY, HX OF  . Chest pain  . Vertigo    LABS Admission on 06/24/2012, Discharged on 06/24/2012  Component Date Value  . WBC 06/24/2012 9.1   . RBC 06/24/2012 4.90   . Hemoglobin  06/24/2012 12.0*  . HCT 06/24/2012 38.9*  . MCV 06/24/2012 79.4   . [facility] 06/24/2012 24.5*  . MCHC 06/24/2012 30.8   . RDW 06/24/2012 16.5*  . Platelets 06/24/2012 204   . Neutrophils Relative 06/24/2012 86*  . Neutro Abs 06/24/2012 7.9*  . Lymphocytes Relative 06/24/2012 9*  . Lymphs Abs 06/24/2012 0.8   . Monocytes Relative 06/24/2012 5   . Monocytes Absolute 06/24/2012 0.4   . Eosinophils Relative 06/24/2012 0   . Eosinophils Absolute 06/24/2012 0.0   . Basophils Relative 06/24/2012 0   . Basophils Absolute 06/24/2012 0.0   . Sodium 06/24/2012 138   . Potassium 06/24/2012 4.5   . Chloride 06/24/2012 101   . CO2 06/24/2012 27   . Glucose, Bld 06/24/2012 137*  . BUN 06/24/2012 11   . Creatinine, Ser 06/24/2012 0.75   . Calcium 06/24/2012 9.9   . Total Protein 06/24/2012 8.1   . Albumin 06/24/2012 3.9   . AST 06/24/2012 21   . ALT 06/24/2012 12   . Alkaline Phosphatase 06/24/2012 88   . Total Bilirubin 06/24/2012 0.6   . GFR  calc non Af Amer 06/24/2012 85*  . GFR calc Af Amer 06/24/2012 >90   . Troponin I 06/24/2012 <0.30      Results for this Opt Visit:     Results for orders placed during the hospital encounter of 06/24/12  CBC WITH DIFFERENTIAL      Component Value Range   WBC 9.1  4.0 - 10.5 K/uL   RBC 4.90  4.22 - 5.81 MIL/uL   Hemoglobin 12.0 (*) 13.0 - 17.0 g/dL   HCT 38.9 (*) 39.0 - 52.0 %   MCV 79.4  78.0 - 100.0 fL   MCH 24.5 (*) 26.0 - 34.0 pg   MCHC 30.8  30.0 - 36.0 g/dL   RDW 16.5 (*) 11.5 - 15.5 %   Platelets 204  150 - 400 K/uL   Neutrophils Relative 86 (*) 43 - 77 %   Neutro Abs 7.9 (*) 1.7 - 7.7 K/uL   Lymphocytes Relative 9 (*) 12 - 46 %   Lymphs Abs 0.8  0.7 - 4.0 K/uL   Monocytes Relative 5  3 - 12 %   Monocytes Absolute 0.4  0.1 - 1.0 K/uL   Eosinophils Relative 0  0 - 5 %   Eosinophils Absolute 0.0  0.0 - 0.7 K/uL   Basophils Relative 0  0 - 1 %   Basophils Absolute 0.0  0.0 - 0.1 K/uL  COMPREHENSIVE METABOLIC PANEL      Component  Value Range   Sodium 138  135 - 145 mEq/L   Potassium 4.5  3.5 - 5.1 mEq/L   Chloride 101  96 - 112 mEq/L   CO2 27  19 - 32 mEq/L   Glucose, Bld 137 (*) 70 - 99 mg/dL   BUN 11  6 - 23 mg/dL   Creatinine, Ser 0.75  0.50 - 1.35 mg/dL   Calcium 9.9  8.4 - 10.5 mg/dL   Total Protein 8.1  6.0 - 8.3 g/dL   Albumin 3.9  3.5 - 5.2 g/dL   AST 21  0 - 37 U/L   ALT 12  0 - 53 U/L   Alkaline Phosphatase 88  39 - 117 U/L   Total Bilirubin 0.6  0.3 - 1.2 mg/dL   GFR calc non Af Amer 85 (*) >90 mL/min   GFR calc Af Amer >90  >90 mL/min  TROPONIN I      Component Value Range   Troponin I <0.30  <0.30 ng/mL    EKG Orders placed during the hospital encounter of 06/24/12  . ED EKG  . ED EKG  . EKG 12-LEAD  . EKG 12-LEAD  . EKG     Prior Assessment and Plan Problem List as of 06/26/2012            Cardiology Problems   HYPERTENSION     Other   ANEMIA   HEMOCCULT POSITIVE STOOL   BACK PAIN, LUMBAR, CHRONIC   DIARRHEA, CHRONIC   PEPTIC ULCER, ACUTE, HEMORRHAGE, HX OF   DIVERTICULITIS, HX OF   BENIGN PROSTATIC HYPERTROPHY, HX OF   Chest pain   Vertigo       Imaging: Ct Head Wo Contrast  06/24/2012  *RADIOLOGY REPORT*  Clinical Data: Dizziness.  Nausea and vomiting.  CT HEAD WITHOUT CONTRAST  Technique:  Contiguous axial images were obtained from the base of the skull through the vertex without contrast.  Comparison: None.  Findings: The brain shows mild generalized age related atrophy. There is an old right posterior frontal  cortical and subcortical infarction.  There are a few old small vessel white matter infarctions.  No sign of acute infarction, mass lesion, hemorrhage, hydrocephalus or extra-axial collection.  Sinuses are clear.  No calvarial abnormality.  IMPRESSION: No identifiably acute finding.  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  Chronic small vessel change of the hemispheric white matter.   Original Report Authenticated By: Nelson Chimes, M.D.    Dg Chest Portable 1  View  06/24/2012  *RADIOLOGY REPORT*  Clinical Data: Dizziness, nausea, vomiting.  PORTABLE CHEST - 1 VIEW  Comparison: 05/15/2009  Findings: Stable mild elevation of the right hemidiaphragm.  Mild cardiomegaly.  Bibasilar atelectasis.  No effusions.  No acute bony abnormality.  Mild vascular congestion.  IMPRESSION: Cardiomegaly, vascular congestion.  Bibasilar atelectasis.   Original Report Authenticated By: Rolm Baptise, M.D.      [facility] Calculation: Score not calculated. Missing: Total Cholesterol

## 2012-06-26 NOTE — Progress Notes (Signed)
Patient ID: Bernard Ramirez, male   DOB: [birth year], 78 y.o.   MRN: [id number]  HPI: Pleasant gentleman referred at the kind request of Bernard Ramirez for newly diagnosed atrial fibrillation.  Bernard Ramirez recently developed vertigo for which he was evaluated in the emergency department.  CT Scan of the brain reportedly showed a small remote CVA, and EKG identified the presence of atrial fibrillation with a controlled ventricular response.  Patient has been treated with meclizine 4 times per day with improvement.  Patient only vaguely recalls an evaluation by Bernard Ramirez In 2004 for chest discomfort and dyspnea.  Stress testing revealed a persistent inferior defect, but cardiac catheterization was entirely normal.  Current Outpatient Prescriptions on File Prior to Visit  Medication Sig Dispense Refill  . furosemide (LASIX) 40 MG tablet Take 40 mg by mouth daily as needed. Swelling.      . gabapentin (NEURONTIN) 300 MG capsule Take 300 mg by mouth daily.      Marland Kitchen HYDROcodone-acetaminophen (NORCO/VICODIN) 5-325 MG per tablet Take 1 tablet by mouth every 6 (six) hours as needed. Pain.      . meclizine (ANTIVERT) 25 MG tablet Take one every 6 hours for dizziness  28 tablet  0  . ondansetron (ZOFRAN ODT) 4 MG disintegrating tablet Take 1 tablet (4 mg total) by mouth every 8 (eight) hours as needed for nausea.  20 tablet  0  . Rivaroxaban (XARELTO) 20 MG TABS Take 1 tablet (20 mg total) by mouth daily.  30 tablet  6   Allergies  Allergen Reactions  . Contrast Media (Iodinated Diagnostic Agents)     Pt states he was given "dye" 20 yrs ago, anaphylaxis - was told to never have it again   . Aspirin      Past Medical History  Diagnosis Date  . Chest pain     2004; with dyspnea, stress nuclear-normal EF + questionable inferior wall ischemia, normal coronary angiography;  2010-negative stress nuclear  . Vertigo     ED evaluation-06/2012  . Upper GI bleed     peptic duodenal ulcer  . Anemia     04/2012:  H&H-10.2/31.5, MCV-77  . Diarrhea     h/o Hemoccult-positive stool    Past Surgical History  Procedure Date  . Rotator cuff repair     Bilateral  . Cholecystectomy   . Vagotomy and pyloroplasty 1970s    Details of procedure uncertain; [id number]  . Transurethral resection of prostate   . Inguinal hernia repair     Left    History reviewed. No pertinent family history.   History   Social History  . Marital Status: Married    Spouse Name: N/A    Number of Children: N/A  . Years of Education: N/A   Occupational History  . Retired    Social History Main Topics  . Smoking status: Current Every Day Smoker -- 48 years    Types: Cigars  . Smokeless tobacco: Not on file  . Alcohol Use: Yes     Comment: rare  . Drug Use: No  . Sexually Active: Not on file   Other Topics Concern  . Not on file   Social History Narrative  . No narrative on file    ROS: Reports limited energy and easy fatigability; intermittent pedal edema; dyspnea with moderate exertion; occasional constipation; arthritic discomfort in the lower extremities; multiple pigmented skin lesions; cold intolerance; history of anemia; seasonal allergies.  Furosemide used most days to treat pedal edema.  All other systems reviewed and are negative.  PHYSICAL EXAM: BP 130/80  Pulse 61  Ht 6' (1.829 m)  Wt 121.147 kg (267 lb 1.3 oz)  BMI 36.22 kg/m2  SpO2 95%  General-Well-developed; no acute distress Body Habitus-proportionate weight and height HEENT-[place]/AT; PERRL; EOM intact; conjunctiva and lids nl; bilateral arcus Neck-No JVD; no carotid bruits Endocrine-No thyromegaly Lungs-Clear lung fields; resonant percussion; normal I-to-E ratio Cardiovascular- normal PMI; normal S1 and S2; irregular rhythm Abdomen-BS normal; soft and non-tender without masses or organomegaly Musculoskeletal-No deformities, cyanosis or clubbing Neurologic-Nl cranial nerves; symmetric strength and tone Skin- Warm, no significant  lesions Extremities-Nl distal pulses; 1-2+ ankle edema  EKG: Tracing performed 06/24/12 obtained and reviewed-atrial fibrillation with controlled ventricular response and a ventricular rate of 65 bpm; occasional PVC; low voltage; delayed R-wave progression-possible anterior MI; nonspecific T-wave abnormality.  Comparison with prior tracing performed 05/16/2009: Atrial fibrillation has replaced sinus bradycardia; R-wave progression is now markedly delayed; QRS voltage decreased.  ASSESSMENT AND PLAN:  Jacqulyn Ducking, MD 06/26/2012 3:23 PM

## 2012-06-26 NOTE — Assessment & Plan Note (Signed)
ED evaluation-06/2012; CT of head-mild generalized atrophy, prior right posterior frontal cortical and subcortical infarction; scattered small lacunar infarctions affecting white matter.  Treated with meclizine with improvement.

## 2012-06-26 NOTE — Assessment & Plan Note (Addendum)
Asymptomatic; diagnosed in 05/2012; heart rate controlled in the absence of the AV nodal blocking agents-likely sick sinus syndrome; TSH-3.0 in 2010; Echocardiogram is pending. Patient has atrial fibrillation of uncertain duration, likely the result of sick sinus syndrome.  He has no symptoms or physical findings to suggest the presence of ischemic heart disease.  If echocardiogram is normal, no additional testing will likely be necessary.  Patient apparently has had borderline hypertension and has advanced age.  Risks and benefits of anticoagulation were discussed with patient and his wife.  They elected to proceed-rivaroxaban will be utilized if cost is not prohibitive.  Prior CVA, which was not appreciated clinically, identified on CT scan.  Significance of cerebral infarction that was not identified clinically is not clear based upon clinical research that has been reported, but certainly increases concern regarding the risk of future thromboembolic events.  MRI could be performed to more accurately define the extent of and distribution of prior neurologic events, but this would not change my therapeutic approach.

## 2012-06-26 NOTE — Patient Instructions (Addendum)
Your physician recommends that you schedule a follow-up appointment in: 1 - 3 months with provider 2 - 1 week with Lattie Haw in the anticoagulation clinic for [place] [facility]  Your physician recommends that you return for lab work in: 1 week  Stool Cards x 3 and return to office asap  Your physician has recommended you make the following change in your medication:  1 - START Xarelto 20 mg daily

## 2012-06-26 NOTE — Assessment & Plan Note (Signed)
Questionable diagnosis.  Blood pressure has been normal in the absence of any significant antihypertensive therapy.

## 2012-06-27 ENCOUNTER — Other Ambulatory Visit: Payer: Self-pay | Admitting: Cardiology

## 2012-06-27 ENCOUNTER — Encounter: Payer: Self-pay | Admitting: Cardiology

## 2012-06-27 MED ORDER — RIVAROXABAN 20 MG PO TABS: 20.0000 mg | ORAL_TABLET | Freq: Every day | ORAL | Status: DC

## 2012-06-28 ENCOUNTER — Other Ambulatory Visit (HOSPITAL_COMMUNITY): Payer: Medicare Other

## 2012-07-04 ENCOUNTER — Encounter: Payer: Self-pay | Admitting: *Deleted

## 2012-07-11 ENCOUNTER — Encounter: Payer: Self-pay | Admitting: Cardiology

## 2012-07-11 DIAGNOSIS — N182 Chronic kidney disease, stage 2 (mild): Secondary | ICD-10-CM | POA: Insufficient documentation

## 2012-07-11 DIAGNOSIS — R7301 Impaired fasting glucose: Secondary | ICD-10-CM | POA: Insufficient documentation

## 2012-07-11 DIAGNOSIS — R739 Hyperglycemia, unspecified: Secondary | ICD-10-CM | POA: Insufficient documentation

## 2012-07-22 ENCOUNTER — Ambulatory Visit: Payer: Medicare Other | Admitting: Cardiology

## 2012-10-07 ENCOUNTER — Encounter (HOSPITAL_COMMUNITY): Payer: Self-pay | Admitting: *Deleted

## 2012-10-08 ENCOUNTER — Ambulatory Visit (INDEPENDENT_AMBULATORY_CARE_PROVIDER_SITE_OTHER): Payer: Self-pay | Admitting: Urgent Care

## 2012-10-08 ENCOUNTER — Encounter: Payer: Self-pay | Admitting: Urgent Care

## 2012-10-08 VITALS — BP 138/90 | Temp 97.0°F | Ht 72.0 in | Wt 270.6 lb

## 2012-10-08 DIAGNOSIS — Z8711 Personal history of peptic ulcer disease: Secondary | ICD-10-CM

## 2012-10-08 DIAGNOSIS — R197 Diarrhea, unspecified: Secondary | ICD-10-CM

## 2012-10-08 DIAGNOSIS — D649 Anemia, unspecified: Secondary | ICD-10-CM

## 2012-10-08 LAB — VITAMIN B12: Vitamin B-12: 248 pg/mL (ref 211–911)

## 2012-10-08 LAB — FERRITIN: Ferritin: 9 ng/mL — ABNORMAL LOW (ref 22–322)

## 2012-10-08 NOTE — Progress Notes (Signed)
Referring Provider: Alonza Bogus, MD Primary Care Physician:  Bernard Bogus, MD Primary Gastroenterologist:  Dr. Barney Ramirez  Chief Complaint  Patient presents with  . Anemia    HPI:  Bernard Ramirez is a 79 y.o. male here as a referral from Bernard Ramirez for anemia.  Bernard Ramirez notes he feels fine.  His anemia workup started by Bernard Ramirez back in October of 2010. At that time he had an attempted colonoscopy which was incomplete do to a tortuous colon and vagal bradycardia. He did have multiple large tubular adenomas and tubulovillous adenomas removed from his colon at that time. 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  Bernard Ramirez had sent him to [facility] for complete colonoscopy with overtube and fluoroscopy, however patient admits he never followed through with this consult.  He has had no further workup of his anemia since that time. Occasionally he has chills.   Denies heartburn, indigestion, nausea, vomiting, dysphagia, odynophagia or anorexia.   Denies constipation, diarrhea, rectal bleeding, melena or weight loss.  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  He has not had a recent B12 level .  He has been on iron for past 1 month.  He takes occasional Hydrocodone for back pain PRN.  Denies OTC NSAIDS.    08/29/12 labs: Hemoglobin 12.5, hematocrit 39.6 MCV 76.2 Iron 55, UIBC 464, TIBC 519, percent saturation 11  Past Medical History  Diagnosis Date  . Chest pain     2004; with dyspnea, stress nuclear-normal EF + questionable inferior wall ischemia, normal coronary angiography;  2010-negative stress nuclear  . Vertigo     ED evaluation-06/2012  . Upper GI bleed 1985    1985-peptic ulcer disease; initial hemigastrectomy; subsequent Billroth II  . Anemia     04/2012: H&H-10.2/31.5, MCV-77; 06/2012:12/38.9  . Diarrhea     h/o Hemoccult-positive stool   . Low back pain   . Hypertension   . Benign prostatic hypertrophy     s/p transurethral resection of the prostate  . Atrial fibrillation 2013    Asymptomatic; diagnosed in 05/2012  . CVA (cerebral infarction)     Past Surgical History  Procedure Laterality Date  . Rotator cuff repair      Bilateral  . Cholecystectomy    . Vagotomy and pyloroplasty  1970s    Details of procedure uncertain; [id number]  . Transurethral resection of prostate    . Inguinal hernia repair      Left  . Billroth ii    . Colonoscopy  Approximately 2000  . Colonoscopy with esophagogastroduodenoscopy (egd)  06-17-2009    [id number] tubular adenomas and 2 tubulovillous adenomas, incomplete, 3 clips placed  . Esophagogastroduodenoscopy  06/17/2009    SLF: normal/chronic gastritis (non-h pylori)    Current Outpatient Prescriptions  Medication Sig Dispense Refill  . furosemide (LASIX) 40 MG tablet Take 40 mg by mouth daily as needed. Swelling.      . gabapentin (NEURONTIN) 300 MG capsule Take 300 mg by mouth daily.      Marland Kitchen HYDROcodone-acetaminophen (NORCO/VICODIN) 5-325 MG per tablet Take 1 tablet by mouth every 6 (six) hours as needed. Pain.      . iron polysaccharides (NIFEREX) 150 MG capsule Take 150 mg by mouth daily.      . ondansetron (ZOFRAN ODT) 4 MG disintegrating tablet Take 1 tablet (4 mg total) by mouth every  8 (eight) hours as needed for nausea.  20 tablet  0   No current facility-administered medications for this visit.    Allergies as of 10/08/2012 - Review Complete 10/08/2012  Allergen Reaction Noted  . Contrast media (iodinated diagnostic agents)  12/05/2011  . Aspirin      Family History:There is no known family history of colorectal carcinoma , liver disease, or inflammatory bowel disease.  Problem Relation Age of Onset  . Diabetes Mellitus II Mother   . Lung disease Father     History   Social History  . Marital Status: Married    Spouse Name: N/A    Number of Children: 36  .  Years of Education: N/A   Occupational History  . Retired [employer]   .     Social History Main Topics  . Smoking status: Former Smoker -- 40 years    Types: Cigars    Quit date: 08/21/1988  . Smokeless tobacco: Not on file     Comment: Quit many years ago; few cigars per day; didn't inhale  . Alcohol Use: 0.5 oz/week    1 drink(s) per week     Comment: rare  . Drug Use: No  . Sexually Active: Not on file   Other Topics Concern  . Not on file   Social History Narrative   Lives w/ wife    Review of Systems: Gen: Denies any fever, sweats, anorexia, fatigue, weakness, malaise, weight loss, and sleep disorder CV: Denies chest pain, angina, palpitations, syncope, orthopnea, PND, peripheral edema, and claudication. Resp: Denies dyspnea at rest, dyspnea with exercise, cough, sputum, wheezing, coughing up blood, and pleurisy. GI: Denies vomiting blood, jaundice, and fecal incontinence.    GU : Denies urinary burning, blood in urine, urinary frequency, urinary hesitancy, nocturnal urination, and urinary incontinence. MS: Denies joint pain, limitation of movement, and swelling, stiffness, low back pain, extremity pain. Denies muscle weakness, cramps, atrophy.  Derm: Denies rash, itching, dry skin, hives, moles, warts, or unhealing ulcers.  Psych: Denies depression, anxiety, memory loss, suicidal ideation, hallucinations, paranoia, and confusion. Heme: Denies bruising, bleeding, and enlarged lymph nodes. Neuro:  Denies any headaches, dizziness, paresthesias. Endo:  Denies any problems with DM, thyroid, adrenal function.  Physical Exam: BP 138/90  Temp(Src) 97 F (36.1 C) (Oral)  Ht 6' (1.829 m)  Wt 270 lb 9.6 oz (122.743 kg)  BMI 36.69 kg/m2 No LMP for male patient. General:   Alert,  Well-developed,obese, pleasant and cooperative in NAD.  Accompanied by his lovely wife. Head:  Normocephalic and atraumatic. Eyes:  Sclera clear, no icterus.   Conjunctiva pink. Ears:   Normal auditory acuity. Nose:  No deformity, discharge, or lesions. Mouth:  No deformity or lesions,oropharynx pink & moist. Neck:  Supple; no masses or thyromegaly. Lungs:  Clear throughout to auscultation.   No wheezes, crackles, or rhonchi. No acute distress. Heart:  Regular rate and rhythm; no murmurs, clicks, rubs,  or gallops. Abdomen:  Protuberant. Normal bowel sounds.  No bruits.  Soft, non-tender and non-distended without masses, hepatosplenomegaly or hernias noted.  No guarding or rebound tenderness.   Rectal:  No external or internal lesions visualized or palpated. Small amount of light brown stool was obtained from the vault and was Hemoccult negative. Msk:  Symmetrical without gross deformities. Normal posture. Pulses:  Normal pulses noted. Extremities:  No edema. Neurologic:  Alert and oriented x4;  grossly normal neurologically. Skin:  Intact without significant lesions or rashes. Lymph Nodes:  No significant  cervical adenopathy. Psych:  Alert and cooperative. Normal mood and affect.

## 2012-10-08 NOTE — Patient Instructions (Addendum)
I will discuss your upcoming colonoscopy with Dr Oneida Alar Please get your labs as soon as possible.  We will call you with results.

## 2012-10-09 ENCOUNTER — Encounter: Payer: Self-pay | Admitting: Urgent Care

## 2012-10-09 NOTE — Progress Notes (Signed)
Faxed to PCP

## 2012-10-09 NOTE — Assessment & Plan Note (Addendum)
Bernard Ramirez is a pleasant 79 y.o. male with history of chronic microcytic anemia.  No gross or occult GI blood loss.  Denies any GI complaints. 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 Exam in 2010 was incomplete due to to bradycardia with vagal response and tortuous colon, however he did not followup with repeat colonoscopy at tertiary care center as directed by Dr. Oneida Alar.  I will discuss further with Dr. Oneida Alar as to whether colonoscopy would be attempted again she or her versus tertiary care center. If nothing is found on colonoscopy, would consider EGD at the same time.  Continue iron daily Check ferritin and B12 Further recommendations to follow

## 2012-10-10 LAB — CBC
%SAT: 11
HCT: 40 %
Hemoglobin: 12.5 g/dL — AB (ref 13.5–17.5)
TIBC: 519
platelet count: 209

## 2012-10-10 NOTE — Progress Notes (Signed)
LMOM for return call LVM for return call I discussed anemia work-up with Dr Oneida Alar She recommends pt be referred to [facility] for colonoscopy with fluoro & overtube. Darius Bump, please arrange Thanks

## 2012-10-10 NOTE — Progress Notes (Signed)
Referral has been faxed to [facility] And they will contact the patient with date & time

## 2012-10-11 NOTE — Progress Notes (Signed)
Results faxed to PCP 

## 2012-10-11 NOTE — Progress Notes (Signed)
Quick Note:  Results given to pt. Continue iron Advised recommendations for colonoscopy at [facility]. Agrees w/plan. Referral has been sent. [id number] L, MD ______

## 2012-10-19 HISTORY — PX: COLONOSCOPY: SHX174

## 2012-11-01 ENCOUNTER — Encounter: Payer: Self-pay | Admitting: Gastroenterology

## 2012-11-08 DIAGNOSIS — I4891 Unspecified atrial fibrillation: Secondary | ICD-10-CM | POA: Insufficient documentation

## 2012-11-08 DIAGNOSIS — Z87891 Personal history of nicotine dependence: Secondary | ICD-10-CM | POA: Insufficient documentation

## 2012-11-08 DIAGNOSIS — IMO0001 Reserved for inherently not codable concepts without codable children: Secondary | ICD-10-CM | POA: Insufficient documentation

## 2012-12-21 ENCOUNTER — Encounter (HOSPITAL_COMMUNITY): Payer: Self-pay | Admitting: *Deleted

## 2012-12-21 ENCOUNTER — Emergency Department (HOSPITAL_COMMUNITY)
Admission: EM | Admit: 2012-12-21 | Discharge: 2012-12-21 | Disposition: A | Discharge status: Home / Self Care | Payer: Medicare Other | Attending: Urology | Admitting: Urology

## 2012-12-21 DIAGNOSIS — Z9079 Acquired absence of other genital organ(s): Secondary | ICD-10-CM | POA: Insufficient documentation

## 2012-12-21 DIAGNOSIS — Z87891 Personal history of nicotine dependence: Secondary | ICD-10-CM | POA: Insufficient documentation

## 2012-12-21 DIAGNOSIS — N320 Bladder-neck obstruction: Secondary | ICD-10-CM

## 2012-12-21 DIAGNOSIS — M549 Dorsalgia, unspecified: Secondary | ICD-10-CM | POA: Insufficient documentation

## 2012-12-21 DIAGNOSIS — Z79899 Other long term (current) drug therapy: Secondary | ICD-10-CM | POA: Insufficient documentation

## 2012-12-21 DIAGNOSIS — R300 Dysuria: Secondary | ICD-10-CM | POA: Insufficient documentation

## 2012-12-21 DIAGNOSIS — R34 Anuria and oliguria: Secondary | ICD-10-CM | POA: Insufficient documentation

## 2012-12-21 DIAGNOSIS — Z87448 Personal history of other diseases of urinary system: Secondary | ICD-10-CM | POA: Insufficient documentation

## 2012-12-21 DIAGNOSIS — Z8719 Personal history of other diseases of the digestive system: Secondary | ICD-10-CM | POA: Insufficient documentation

## 2012-12-21 DIAGNOSIS — Z8673 Personal history of transient ischemic attack (TIA), and cerebral infarction without residual deficits: Secondary | ICD-10-CM | POA: Insufficient documentation

## 2012-12-21 DIAGNOSIS — R319 Hematuria, unspecified: Secondary | ICD-10-CM | POA: Insufficient documentation

## 2012-12-21 DIAGNOSIS — Z8679 Personal history of other diseases of the circulatory system: Secondary | ICD-10-CM | POA: Insufficient documentation

## 2012-12-21 DIAGNOSIS — Z862 Personal history of diseases of the blood and blood-forming organs and certain disorders involving the immune mechanism: Secondary | ICD-10-CM | POA: Insufficient documentation

## 2012-12-21 DIAGNOSIS — I10 Essential (primary) hypertension: Secondary | ICD-10-CM | POA: Insufficient documentation

## 2012-12-21 LAB — URINALYSIS, MICROSCOPIC ONLY
Bilirubin Urine: NEGATIVE
Nitrite: POSITIVE — AB
Protein, ur: 100 mg/dL — AB
Specific Gravity, Urine: 1.025 (ref 1.005–1.030)
Urobilinogen, UA: 2 mg/dL — ABNORMAL HIGH (ref 0.0–1.0)

## 2012-12-21 MED ORDER — LIDOCAINE HCL 2 % EX GEL
CUTANEOUS | Status: AC
  Administered 2012-12-21: 15:00:00
  Filled 2012-12-21: qty 10

## 2012-12-21 NOTE — ED Notes (Signed)
Patient with no complaints at this time. Respirations even and unlabored. Skin warm/dry. Discharge instructions reviewed with patient at this time. Patient given opportunity to voice concerns/ask questions. Patient discharged at this time and left Emergency Department via wheelchair. 

## 2012-12-21 NOTE — ED Notes (Signed)
Assisted MD with foley placement. Placement unsuccessful x 3 by MD. Patient tolerated attempted insertions fairly well. Patient with noted bleeding from urethra. Patient peri care provided after attempts. Gown and bed pad changed. Awaiting urology consult at this time.

## 2012-12-21 NOTE — Consult Note (Signed)
Note [id number]

## 2012-12-21 NOTE — ED Provider Notes (Signed)
History  This chart was scribed for Richarda Blade, MD by Jenne Campus, ED Scribe. This patient was seen in room [facility] and the patient's care was started at 12:37 PM.  CSN: [id number]  Arrival date & time 12/21/12  1209   First MD Initiated Contact with Patient 12/21/12 1237      Chief Complaint  Patient presents with  . Urinary Retention  . Back Pain     The history is provided by the patient. No language interpreter was used.    HPI Comments: Bernard Ramirez is a 79 y.o. male with a h/o  prostate resection in the 1990s who presents to the Emergency Department complaining of 2 to 3 weeks of intermittent hematuria described as bright red blood clots with associated decreased urine, the most recent episode occuring last night. He states that he normally urinates 2 to 3 times during the night but did not urinate at al last night and reports that his urine stream was slower than normal this morning. He reports that he has followed up with Dr. Michela Pitcher for the symptoms and was advised that he would need a torp. He was told to call his office when the symptoms start up, but he states that the symptoms mostly occur in the middle of the night and resolve by morning. He denies fever, emesis, dysuria, abdominal pain and CP as associated symptoms. He has a h/o anemia which is treated with iron, HTN and A. Fib treated with anticoagulants. Pt is an occasional alcohol user and a former smoker.  Past Medical History  Diagnosis Date  . Chest pain     2004; with dyspnea, stress nuclear-normal EF + questionable inferior wall ischemia, normal coronary angiography;  2010-negative stress nuclear  . Vertigo     ED evaluation-06/2012  . Upper GI bleed 1985    1985-peptic ulcer disease; initial hemigastrectomy; subsequent Billroth II  . Anemia     04/2012: H&H-10.2/31.5, MCV-77; 06/2012:12/38.9  . Diarrhea     h/o Hemoccult-positive stool  . Low back pain   . Hypertension   . Benign prostatic  hypertrophy     s/p transurethral resection of the prostate  . Atrial fibrillation 2013    Asymptomatic; diagnosed in 05/2012  . CVA (cerebral infarction)     Past Surgical History  Procedure Laterality Date  . Rotator cuff repair      Bilateral  . Cholecystectomy    . Vagotomy and pyloroplasty  1970s    Details of procedure uncertain; [id number]  . Transurethral resection of prostate    . Inguinal hernia repair      Left  . Billroth ii    . Colonoscopy  Approximately 2000  . Colonoscopy with esophagogastroduodenoscopy (egd)  06-17-2009    [id number] tubular adenomas and 2 tubulovillous adenomas, incomplete, 3 clips placed  . Esophagogastroduodenoscopy  06/17/2009    SLF: normal/chronic gastritis (non-h pylori)    Family History  Problem Relation Age of Onset  . Diabetes Mellitus II Mother   . Lung disease Father     History  Substance Use Topics  . Smoking status: Former Smoker -- 40 years    Types: Cigars    Quit date: 08/21/1988  . Smokeless tobacco: Not on file     Comment: Quit many years ago; few cigars per day; didn't inhale  . Alcohol Use: 0.5 oz/week    1 drink(s) per week     Comment: rare      Review of Systems  Gastrointestinal:  Negative for nausea, vomiting, abdominal pain and diarrhea.  Genitourinary: Positive for hematuria, decreased urine volume and difficulty urinating. Negative for dysuria.  All other systems reviewed and are negative.    Allergies  Contrast media and Aspirin  Home Medications   Current Outpatient Rx  Name  Route  Sig  Dispense  Refill  . apixaban (ELIQUIS) 5 MG TABS tablet   Oral   Take 5 mg by mouth 2 (two) times daily.         . furosemide (LASIX) 40 MG tablet   Oral   Take 40 mg by mouth daily as needed (for swelling ln legs).          . gabapentin (NEURONTIN) 300 MG capsule   Oral   Take 300 mg by mouth at bedtime.          Marland Kitchen HYDROcodone-acetaminophen (NORCO/VICODIN) 5-325 MG per tablet   Oral   Take 1  tablet by mouth every 6 (six) hours as needed. Pain.         Marland Kitchen oxymetazoline (AFRIN) 0.05 % nasal spray   Nasal   Place 2 sprays into the nose at bedtime.           Triage Vitals: BP 173/98  Pulse 58  Temp(Src) 97 F (36.1 C) (Oral)  Resp 20  Ht 6' (1.829 m)  Wt 265 lb (120.203 kg)  BMI 35.93 kg/m2  SpO2 97%  Physical Exam  Nursing note and vitals reviewed. Constitutional: He is oriented to person, place, and time. He appears well-developed and well-nourished. No distress.  HENT:  Head: Normocephalic and atraumatic.  Eyes: EOM are normal.  Neck: Neck supple. No tracheal deviation present.  Cardiovascular: Normal rate.   Pulmonary/Chest: Effort normal. No respiratory distress.  Musculoskeletal: Normal range of motion.  Neurological: He is alert and oriented to person, place, and time.  Skin: Skin is warm and dry.  Psychiatric: He has a normal mood and affect. His behavior is normal.    ED Course  Procedures (including critical care time)  Multiple attempts at bladder catheterization by nursing failed.  Procedure: I attempted to catheterize with a 16 and 18 French coud catheter, and a 22-gauge irrigating catheter. None of my attempts were successful.  DIAGNOSTIC STUDIES: Oxygen Saturation is 97% on room air, normal by my interpretation.    COORDINATION OF CARE: 1:01 PM-Discussed treatment plan which includes foley catheter with pt at bedside and pt agreed to plan.   Consultation- 15:30- Urology paged to catheterize patient- Discussed with Dr Michela Pitcher  Labs Reviewed  URINALYSIS, MICROSCOPIC ONLY - Abnormal; Notable for the following:    Color, Urine RED (*)    APPearance CLOUDY (*)    Hgb urine dipstick LARGE (*)    Ketones, ur TRACE (*)    Protein, ur 100 (*)    Urobilinogen, UA 2.0 (*)    Nitrite POSITIVE (*)    Leukocytes, UA TRACE (*)    Bacteria, UA FEW (*)    All other components within normal limits  URINE CULTURE   No results found.   1.  Hematuria   2. Bladder outlet obstruction       MDM  Hematuria with bladder outlet obstruction. This is likely a combination of prosthetic hypertrophy and blood causing obstruction.   Plan: Urology Evaluation in the ED    I personally performed the services described in this documentation, which was scribed in my presence. The recorded information has been reviewed and is accurate.  Richarda Blade, MD 12/21/12 [phone]

## 2012-12-21 NOTE — ED Notes (Signed)
Patient catheter drainage system changed over to leg bag. Patient instructed on how to change and manage the leg bag system. Patient also provided large drainage bag for night time use, instructions on how to change out the system and infection prevention provided.

## 2012-12-21 NOTE — ED Notes (Signed)
Attempted Foley irrigation.90 mls inserted in 30 mls increments. Approximately 5 mls returned. Patient c/o increased need to urinate. Marya Amsler, RN in to attempt further irrigation.

## 2012-12-21 NOTE — ED Notes (Signed)
States he started having problems urinating last night, states that he is having bright red blood with clots in his urine.

## 2012-12-21 NOTE — ED Notes (Signed)
Patient also complains of lower back pain, states it may or may not be related.  States he has had lower back pain for years.

## 2012-12-22 ENCOUNTER — Encounter (HOSPITAL_COMMUNITY): Payer: Self-pay

## 2012-12-22 ENCOUNTER — Emergency Department (HOSPITAL_COMMUNITY)
Admission: EM | Admit: 2012-12-22 | Discharge: 2012-12-22 | Disposition: A | Discharge status: Home / Self Care | Payer: Medicare Other | Attending: Emergency Medicine | Admitting: Emergency Medicine

## 2012-12-22 ENCOUNTER — Emergency Department (HOSPITAL_COMMUNITY)
Admission: EM | Admit: 2012-12-22 | Discharge: 2012-12-22 | Disposition: A | Discharge status: Home / Self Care | Payer: Medicare Other | Source: Home / Self Care | Attending: Emergency Medicine | Admitting: Emergency Medicine

## 2012-12-22 DIAGNOSIS — Z8673 Personal history of transient ischemic attack (TIA), and cerebral infarction without residual deficits: Secondary | ICD-10-CM | POA: Insufficient documentation

## 2012-12-22 DIAGNOSIS — I4891 Unspecified atrial fibrillation: Secondary | ICD-10-CM | POA: Insufficient documentation

## 2012-12-22 DIAGNOSIS — Z8719 Personal history of other diseases of the digestive system: Secondary | ICD-10-CM | POA: Insufficient documentation

## 2012-12-22 DIAGNOSIS — T83091A Other mechanical complication of indwelling urethral catheter, initial encounter: Secondary | ICD-10-CM | POA: Insufficient documentation

## 2012-12-22 DIAGNOSIS — Z87448 Personal history of other diseases of urinary system: Secondary | ICD-10-CM | POA: Insufficient documentation

## 2012-12-22 DIAGNOSIS — Z79899 Other long term (current) drug therapy: Secondary | ICD-10-CM | POA: Insufficient documentation

## 2012-12-22 DIAGNOSIS — I10 Essential (primary) hypertension: Secondary | ICD-10-CM | POA: Insufficient documentation

## 2012-12-22 DIAGNOSIS — T839XXD Unspecified complication of genitourinary prosthetic device, implant and graft, subsequent encounter: Secondary | ICD-10-CM

## 2012-12-22 DIAGNOSIS — Z9889 Other specified postprocedural states: Secondary | ICD-10-CM | POA: Insufficient documentation

## 2012-12-22 DIAGNOSIS — Z8679 Personal history of other diseases of the circulatory system: Secondary | ICD-10-CM | POA: Insufficient documentation

## 2012-12-22 DIAGNOSIS — R339 Retention of urine, unspecified: Secondary | ICD-10-CM

## 2012-12-22 DIAGNOSIS — Z8669 Personal history of other diseases of the nervous system and sense organs: Secondary | ICD-10-CM | POA: Insufficient documentation

## 2012-12-22 DIAGNOSIS — Z862 Personal history of diseases of the blood and blood-forming organs and certain disorders involving the immune mechanism: Secondary | ICD-10-CM | POA: Insufficient documentation

## 2012-12-22 DIAGNOSIS — Z87891 Personal history of nicotine dependence: Secondary | ICD-10-CM | POA: Insufficient documentation

## 2012-12-22 DIAGNOSIS — R141 Gas pain: Secondary | ICD-10-CM | POA: Insufficient documentation

## 2012-12-22 DIAGNOSIS — R142 Eructation: Secondary | ICD-10-CM | POA: Insufficient documentation

## 2012-12-22 DIAGNOSIS — Y838 Other surgical procedures as the cause of abnormal reaction of the patient, or of later complication, without mention of misadventure at the time of the procedure: Secondary | ICD-10-CM | POA: Insufficient documentation

## 2012-12-22 LAB — URINE CULTURE
Colony Count: NO GROWTH
Culture: NO GROWTH

## 2012-12-22 LAB — BASIC METABOLIC PANEL
BUN: 8 mg/dL (ref 6–23)
CO2: 26 mEq/L (ref 19–32)
Chloride: 98 mEq/L (ref 96–112)
GFR calc Af Amer: >90 mL/min (ref >90)
Potassium: 3.7 mEq/L (ref 3.5–5.1)

## 2012-12-22 LAB — URINALYSIS, ROUTINE W REFLEX MICROSCOPIC
Ketones, ur: NEGATIVE mg/dL
Nitrite: NEGATIVE
Protein, ur: 100 mg/dL — AB
Specific Gravity, Urine: 1.025 (ref 1.005–1.030)
Urobilinogen, UA: 1 mg/dL (ref 0.0–1.0)

## 2012-12-22 LAB — URINE MICROSCOPIC-ADD ON

## 2012-12-22 LAB — PROTIME-INR: Prothrombin Time: 13.9 seconds (ref 11.6–15.2)

## 2012-12-22 NOTE — ED Notes (Signed)
18 french foley catheter irrigated with 30 ml NS, no resistance noted upon flushing said catheter.  30 ml fluid drained freely without difficulty.  Fluid drained freely. Small amount blood noted in tubing. Pt denies discomfort at this time.

## 2012-12-22 NOTE — ED Notes (Signed)
Pt was seen in the ed yesterday and had a foley cath placed. Cath stopped draining around 5am today.

## 2012-12-22 NOTE — ED Notes (Signed)
Pt stood up and was dressing to prepare for discharge when a large amount of urine was noted leaking from around the 18 french catheter. Approximately 100 mls of blood tinged urine with clots noted in leg bag. EDP notified.

## 2012-12-22 NOTE — ED Provider Notes (Signed)
History     CSN: [id number]  Arrival date & time 12/22/12  1312   First MD Initiated Contact with Patient 12/22/12 1328      Chief Complaint  Patient presents with  . Urinary Retention    (Consider location/radiation/quality/duration/timing/severity/associated sxs/prior treatment) HPI Comments: 79 year old male history of prior prostate surgery, presented yesterday for urinary retention and had Foley catheter placed by urologist. He states that over the last 12 hours she has had difficulty with urination stating blood clots obstructing urinary flow. He has had increased lower abdominal distention since that time. No fevers, no chills, no coughing or shortness of breath and no lower back pain. Nothing seems to make this better or worse  The history is provided by the patient, the spouse and medical records.    Past Medical History  Diagnosis Date  . Chest pain     2004; with dyspnea, stress nuclear-normal EF + questionable inferior wall ischemia, normal coronary angiography;  2010-negative stress nuclear  . Vertigo     ED evaluation-06/2012  . Upper GI bleed 1985    1985-peptic ulcer disease; initial hemigastrectomy; subsequent Billroth II  . Anemia     04/2012: H&H-10.2/31.5, MCV-77; 06/2012:12/38.9  . Diarrhea     h/o Hemoccult-positive stool  . Low back pain   . Hypertension   . Benign prostatic hypertrophy     s/p transurethral resection of the prostate  . Atrial fibrillation 2013    Asymptomatic; diagnosed in 05/2012  . CVA (cerebral infarction)     Past Surgical History  Procedure Laterality Date  . Rotator cuff repair      Bilateral  . Cholecystectomy    . Vagotomy and pyloroplasty  1970s    Details of procedure uncertain; [id number]  . Transurethral resection of prostate    . Inguinal hernia repair      Left  . Billroth ii    . Colonoscopy  Approximately 2000  . Colonoscopy with esophagogastroduodenoscopy (egd)  06-17-2009    [id number] tubular adenomas and 2  tubulovillous adenomas, incomplete, 3 clips placed  . Esophagogastroduodenoscopy  06/17/2009    SLF: normal/chronic gastritis (non-h pylori)    Family History  Problem Relation Age of Onset  . Diabetes Mellitus II Mother   . Lung disease Father     History  Substance Use Topics  . Smoking status: Former Smoker -- 40 years    Types: Cigars    Quit date: 08/21/1988  . Smokeless tobacco: Not on file     Comment: Quit many years ago; few cigars per day; didn't inhale  . Alcohol Use: 0.5 oz/week    1 drink(s) per week     Comment: rare      Review of Systems  All other systems reviewed and are negative.    Allergies  Contrast media and Aspirin  Home Medications   Current Outpatient Rx  Name  Route  Sig  Dispense  Refill  . apixaban (ELIQUIS) 5 MG TABS tablet   Oral   Take 5 mg by mouth 2 (two) times daily.         . furosemide (LASIX) 40 MG tablet   Oral   Take 40 mg by mouth daily as needed (for swelling ln legs).          . gabapentin (NEURONTIN) 300 MG capsule   Oral   Take 300 mg by mouth at bedtime.          Marland Kitchen HYDROcodone-acetaminophen (NORCO/VICODIN) 5-325 MG per tablet  Oral   Take 1 tablet by mouth every 6 (six) hours as needed. Pain.         Marland Kitchen oxymetazoline (AFRIN) 0.05 % nasal spray   Nasal   Place 2 sprays into the nose at bedtime.           BP 163/91  Pulse 64  Temp(Src) 97.9 F (36.6 C) (Oral)  Resp 18  Ht 6\' 1"  (1.854 m)  Wt 265 lb (120.203 kg)  BMI 34.97 kg/m2  SpO2 96%  Physical Exam  Nursing note and vitals reviewed. Constitutional: He appears well-developed and well-nourished. No distress.  HENT:  Head: Normocephalic and atraumatic.  Mouth/Throat: Oropharynx is clear and moist. No oropharyngeal exudate.  Eyes: Conjunctivae and EOM are normal. Pupils are equal, round, and reactive to light. Right eye exhibits no discharge. Left eye exhibits no discharge. No scleral icterus.  Neck: Normal range of motion. Neck supple.  No JVD present. No thyromegaly present.  Cardiovascular: Normal rate, regular rhythm, normal heart sounds and intact distal pulses.  Exam reveals no gallop and no friction rub.   No murmur heard. Pulmonary/Chest: Effort normal and breath sounds normal. No respiratory distress. He has no wheezes. He has no rales.  Abdominal: Soft. Bowel sounds are normal. He exhibits no distension and no mass. There is no tenderness.  Genitourinary:  Normal-appearing circumcised penis, Foley catheter in place, no drainage from the Foley catheter, leg bag is empty at this time.  Musculoskeletal: Normal range of motion. He exhibits no edema and no tenderness.  Lymphadenopathy:    He has no cervical adenopathy.  Neurological: He is alert. Coordination normal.  Skin: Skin is warm and dry. No rash noted. No erythema.  Psychiatric: He has a normal mood and affect. His behavior is normal.    ED Course  Procedures (including critical care time)  Labs Reviewed - No data to display No results found.   1. Urinary retention       MDM  Overall the patient is nontoxic, his Foley catheter has been irrigated by nursing and now is flowing well. Consult note from urologist yesterday May no recommendations about antibiotic therapy. He is going to see the urologist within the next 20 hours in his office. Will hold on that at this time, Foley catheter now functional, patient appears stable for discharge.        Johnna Acosta, MD 12/22/12 1435

## 2012-12-22 NOTE — ED Notes (Signed)
Pt was just d/c from the ed, got home and is leaking urine from around foley

## 2012-12-22 NOTE — ED Notes (Signed)
Pt refused to have old catheter removed and new one placed. Pt feels the current catheter is working and would like to wait to see if it will drain before" drastic measures are taken". EDP notified.

## 2012-12-22 NOTE — ED Provider Notes (Signed)
History     CSN: [id number]  Arrival date & time 12/22/12  1557   First MD Initiated Contact with Patient 12/22/12 1651      Chief Complaint  Patient presents with  . Hematuria    (Consider location/radiation/quality/duration/timing/severity/associated sxs/prior treatment) HPI Comments: Patient presents with leaking around his Foley catheter. He was discharged 2 hours ago. This is his third ED visit in the past 2 days for urinary problems. He had a Foley catheter placed yesterday by Dr. Michela Pitcher. He was irrigated on his last visit as it was not draining. He went home and noticed drainage around his penis. Bag does not seem to be filling. No fevers, chills, chest pain, shortness of breath, nausea or vomiting. Some increasing lower abdominal distention.  The history is provided by the patient.    Past Medical History  Diagnosis Date  . Chest pain     2004; with dyspnea, stress nuclear-normal EF + questionable inferior wall ischemia, normal coronary angiography;  2010-negative stress nuclear  . Vertigo     ED evaluation-06/2012  . Upper GI bleed 1985    1985-peptic ulcer disease; initial hemigastrectomy; subsequent Billroth II  . Anemia     04/2012: H&H-10.2/31.5, MCV-77; 06/2012:12/38.9  . Diarrhea     h/o Hemoccult-positive stool  . Low back pain   . Hypertension   . Benign prostatic hypertrophy     s/p transurethral resection of the prostate  . Atrial fibrillation 2013    Asymptomatic; diagnosed in 05/2012  . CVA (cerebral infarction)     Past Surgical History  Procedure Laterality Date  . Rotator cuff repair      Bilateral  . Cholecystectomy    . Vagotomy and pyloroplasty  1970s    Details of procedure uncertain; [id number]  . Transurethral resection of prostate    . Inguinal hernia repair      Left  . Billroth ii    . Colonoscopy  Approximately 2000  . Colonoscopy with esophagogastroduodenoscopy (egd)  06-17-2009    [id number] tubular adenomas and 2 tubulovillous  adenomas, incomplete, 3 clips placed  . Esophagogastroduodenoscopy  06/17/2009    SLF: normal/chronic gastritis (non-h pylori)    Family History  Problem Relation Age of Onset  . Diabetes Mellitus II Mother   . Lung disease Father     History  Substance Use Topics  . Smoking status: Former Smoker -- 40 years    Types: Cigars    Quit date: 08/21/1988  . Smokeless tobacco: Not on file     Comment: Quit many years ago; few cigars per day; didn't inhale  . Alcohol Use: 0.5 oz/week    1 drink(s) per week     Comment: rare      Review of Systems  Gastrointestinal: Negative for nausea, vomiting and abdominal pain.  Genitourinary: Positive for hematuria and difficulty urinating.  A complete 10 system review of systems was obtained and all systems are negative except as noted in the HPI and PMH.    Allergies  Contrast media and Aspirin  Home Medications   Current Outpatient Rx  Name  Route  Sig  Dispense  Refill  . apixaban (ELIQUIS) 5 MG TABS tablet   Oral   Take 5 mg by mouth 2 (two) times daily.         . furosemide (LASIX) 40 MG tablet   Oral   Take 40 mg by mouth daily as needed (for swelling ln legs).          Marland Kitchen  gabapentin (NEURONTIN) 300 MG capsule   Oral   Take 300 mg by mouth at bedtime.          Marland Kitchen HYDROcodone-acetaminophen (NORCO/VICODIN) 5-325 MG per tablet   Oral   Take 1 tablet by mouth every 6 (six) hours as needed. Pain.         Marland Kitchen oxymetazoline (AFRIN) 0.05 % nasal spray   Nasal   Place 2 sprays into the nose at bedtime.           BP 160/90  Pulse 94  Temp(Src) 97.9 F (36.6 C) (Oral)  Resp 20  Ht 6' (1.829 m)  Wt 265 lb (120.203 kg)  BMI 35.93 kg/m2  SpO2 100%  Physical Exam  Constitutional: He is oriented to person, place, and time. He appears well-developed and well-nourished. No distress.  HENT:  Head: Normocephalic and atraumatic.  Mouth/Throat: Oropharynx is clear and moist. No oropharyngeal exudate.  Eyes: Conjunctivae  and EOM are normal. Pupils are equal, round, and reactive to light.  Neck: Normal range of motion. Neck supple.  Cardiovascular: Normal rate and normal heart sounds.   No murmur heard. irregular rhythm  Pulmonary/Chest: Effort normal and breath sounds normal.  Abdominal: Soft. There is no tenderness. There is no rebound and no guarding.  Genitourinary:  Foley catheter in place with scant bloody urine surrounding meatus. Right tinged urine in drainage bag.  Musculoskeletal: Normal range of motion. He exhibits no edema and no tenderness.  Neurological: He is alert and oriented to person, place, and time. No cranial nerve deficit. He exhibits normal muscle tone. Coordination normal.  Skin: Skin is warm.    ED Course  Procedures (including critical care time)  Labs Reviewed  URINALYSIS, ROUTINE W REFLEX MICROSCOPIC - Abnormal; Notable for the following:    APPearance CLOUDY (*)    Hgb urine dipstick LARGE (*)    Bilirubin Urine SMALL (*)    Protein, ur 100 (*)    Leukocytes, UA SMALL (*)    All other components within normal limits  URINE MICROSCOPIC-ADD ON - Abnormal; Notable for the following:    Bacteria, UA FEW (*)    All other components within normal limits  URINE CULTURE   No results found.   1. Foley catheter problem, subsequent encounter       MDM  Nontoxic-appearing patient with Foley catheter malfunction. On eliquis for atrial fibrillation.  Foley catheter was flushed by nursing staff without difficulty. No blood or clots noted, slight pink tinge.  Discussed with Dr. Michela Pitcher. He recommends replacing catheter completely with new 18 French coud catheter or 22 French catheter. Patient refuses to have catheter replaced. There was much difficulty with insertion yesterday. Urine culture from yesterday negative. He has an appointment with Dr. Michela Pitcher in 13 hours. He states he feels fine and feels no bladder pressure abdominal pain. He'll be discharged to followup with Dr.  Michela Pitcher.      Ezequiel Essex, MD 12/22/12 2111

## 2012-12-22 NOTE — ED Notes (Signed)
Irrigated foley gently and bloody urine returned.  Connected foley back to leg bag and urine draining.  Emptied 126ml from leg bag.  Urine continuing to drain.  Notified pt's primary RN and Dr. Sabra Heck.  Pt says feels much more comfortable.

## 2012-12-22 NOTE — ED Notes (Signed)
Discharge instructions given and reviewed with patient.  Patient verbalized understanding to follow up with Dr. Michela Pitcher at [place] tomorrow.  Patient discharged home in good condition.

## 2012-12-22 NOTE — ED Notes (Signed)
Pt tolerated a 20 gauge catheter placed fairly well. Bleeding noted from around catheter noted again. Blood tinged urine noted draining in leg bag. EDP notified, Urologist on call also notified.

## 2012-12-22 NOTE — Consult Note (Signed)
NAME:  Bernard Ramirez, [place] NO.:  [id number]  MEDICAL RECORD NO.:  [id number]  LOCATION:  [facility]                         FACILITY:  [facility]  PHYSICIAN:  Marissa Nestle, M.D.DATE OF BIRTH:  13-Nov-1930  DATE OF CONSULTATION: DATE OF DISCHARGE:  12/21/2012                                CONSULTATION   CHIEF COMPLAINT:  Gross total painless hematuria and urinary retention.  HISTORY OF PRESENT ILLNESS:  A 79 year old gentleman came to the emergency room with history of having gross hematuria, unable to void. They could not get a Foley catheter in and so I came in to put a Foley catheter.  The patient is well known to me.  Several years ago, he had a TUR prostate done for BPH.  Recently, I cystoscoped them and he has a regrowth of the adenoma, which I told him he will need another TUR prostate.  He was waiting to do that and then they found him that he is in atrial fibrillation, put him on blood thinner, and now he started having gross hematuria.  He has symptoms of prostatism, so I put a coude tip Foley catheter, we irrigated the bladder did not get any blood clots out.  PAST MEDICAL HISTORY: 1. History of having atrial fibrillation on blood thinner. 2. History of having BPH with regrowth of the adenoma requiring     another TUR prostate, which he is waiting.  ALLERGIES:  HE IS ALLERGIC TO ASPIRIN AND IODINE.  PERSONAL HISTORY:  Does not smoke or drink.  REVIEW OF SYSTEMS:  Unremarkable.  PHYSICAL EXAMINATION:  GENERAL:  Moderately built male, not in acute distress, fully conscious, alert, oriented. VITAL SIGNS:  Temperature is 97, pulse 65 per minute, blood pressure is 157/97. ABDOMEN:  Soft, flat.  Liver, spleen, kidneys not palpable.  No CVA tenderness. GU:  External genitalia is uncircumcised meatus adequate.  Testicles are normal. RECTAL:  Deferred. EXTREMITIES:  Normal.  IMPRESSION:  Acute urinary retention.  I put a #18 coude tipped Foley catheter  without any difficulty.  He started to drain bloody urine and about 500 mL urine came out, and then I irrigated his bladder.  I did not get any blood clots out.  PLAN:  My plan is that I am leaving the catheter in and sending him home with the instruction that if he start to have any bleeding and gets worse, and if his catheter stops draining come back to the emergency room or call me.  I am going to see him on Monday in the office at 10 o'clock or I asked him to call the office that morning to come around 10 o'clock.  He probably needed TUR prostate, but he is on blood thinner, I will have to talk to his family physician to see we can stop that and figure out how to handle that situation.  FINAL DIAGNOSES: 1. Gross hematuria. 2. Benign prostatic hyperplasia.  DISCHARGE CONDITION:  Satisfactory.  DISCHARGE MEDICATIONS:  I asked him to continue his medications what he is taking and I will see him on Monday.     Marissa Nestle, M.D.     MIJ/MEDQ  D:  12/21/2012  T:  12/22/2012  Job:  [id number]

## 2012-12-24 ENCOUNTER — Encounter (HOSPITAL_COMMUNITY)
Admission: RE | Admit: 2012-12-24 | Discharge: 2012-12-24 | Disposition: A | Discharge status: Home / Self Care | Payer: Medicare Other | Source: Ambulatory Visit | Attending: Urology | Admitting: Urology

## 2012-12-24 ENCOUNTER — Encounter (HOSPITAL_COMMUNITY): Payer: Self-pay

## 2012-12-24 ENCOUNTER — Encounter (HOSPITAL_COMMUNITY): Payer: Self-pay | Admitting: Pharmacy Technician

## 2012-12-24 LAB — URINE CULTURE

## 2012-12-24 LAB — CBC
HCT: 37.6 % — ABNORMAL LOW (ref 39.0–52.0)
Hemoglobin: 12 g/dL — ABNORMAL LOW (ref 13.0–17.0)
MCHC: 31.9 g/dL (ref 30.0–36.0)
WBC: 8.8 10*3/uL (ref 4.0–10.5)

## 2012-12-24 LAB — SURGICAL PCR SCREEN: Staphylococcus aureus: POSITIVE — AB

## 2012-12-24 NOTE — Patient Instructions (Addendum)
[name] BUREK  12/24/2012   Your procedure is scheduled on:  Friday, 12/27/12  Report to [facility] at [place] AM.  Call this number if you have problems the morning of surgery: [phone]   Remember:   Do not eat food or drink liquids after midnight.   Take these medicines the morning of surgery with A SIP OF WATER: gabapentin (neurontin)   Do not wear jewelry, make-up or nail polish.  Do not wear lotions, powders, or perfumes. You may wear deodorant.  Do not shave 48 hours prior to surgery. Men may shave face and neck.  Do not bring valuables to the hospital.  Contacts, dentures or bridgework may not be worn into surgery.  Leave suitcase in the car. After surgery it may be brought to your room.  For patients admitted to the hospital, checkout time is 11:00 AM the day of  discharge.   Patients discharged the day of surgery will not be allowed to drive  home.  Name and phone number of your driver: family  Special Instructions: Shower using CHG 2 nights before surgery and the night before surgery.  If you shower the day of surgery use CHG.  Use special wash - you have one bottle of CHG for all showers.  You should use approximately 1/3 of the bottle for each shower.   Please read over the following fact sheets that you were given: Pain Booklet, MRSA Information, Surgical Site Infection Prevention, Anesthesia Post-op Instructions and Care and Recovery After Surgery Transurethral Resection of the Prostate Care After Refer to this sheet in the next few weeks. These discharge instructions provide you with general information on caring for yourself after you leave the hospital. Your caregiver may also give you specific instructions. Your treatment has been planned according to the most current medical practices available, but unavoidable complications sometimes occur. If you have any problems or questions after discharge, please call your caregiver. HOME CARE INSTRUCTIONS  Medications  You will  receive medicine for pain management. As your level of discomfort decreases, adjustments in your pain medicines may be made.  Since it is important that you do deep breathing and coughing exercises and increase your activity, it may be helpful to take pain medicines prior to these activities. Take all medicines as directed.  You may be given a medicine (antibiotic) to kill germs following surgery. Finish all medicines. Let your caregiver know if you have any side effects or problems from the medicine. Hygiene  You can take a shower after surgery.  You should not take a bath while you still have the urethral catheter. Activity  You will be encouraged to get out of bed as much as possible and increase your activity level as tolerated.  Spend the first week in and around your home. For 10 days, avoid the following:  Straining.  Running.  Strenuous work.  Walks longer than a few blocks.  Riding for extended periods.  Sexual relations.  Do not lift heavy objects (more than 5 pounds/2.25 kilograms) for at least 1 month. When lifting, use your arms instead of your abdominal muscles.  You will be encouraged to walk as tolerated. Do not exert yourself. Increase your activity level slowly. Remember that it is important to keep moving after an operation of any type. This cuts down on the possibility of developing blood clots.  Your caregiver will tell you when you can resume driving and light housework. Discuss this at your first office visit after discharge. Diet  No special diet is ordered after a TURP. However, if you are on a special diet for another medical problem, it should be continued.  Normal fluid intake is usually recommended.  Avoid alcohol and caffeinated drinks for 2 weeks. They irritate the bladder. Decaffeinated drinks are okay.  Avoid spicy foods. Bladder Function  For the first 10 days, empty the bladder whenever you feel a definite desire. Do not try to hold the  urine for long periods of time.  Urinating once or twice a night even after you are healed is not uncommon.  You may see some recurrence of blood in the urine after discharge from the hospital. If this occurs, force fluids again as you did in the hospital and reduce your activity. Bowel Function  You may experience some constipation after surgery. This can be minimized by increasing fluids and fiber in your diet. Drink enough water and fluids to keep your urine clear or pale yellow.  A stool softener may be prescribed for use at home. Do not strain to move your bowels.  If you are requiring increased pain medicine, it is important that you take stool softeners to prevent constipation. This will help to promote proper healing by reducing the need to strain to move your bowels. Sexual Activity  Semen movement in the opposite direction and into the bladder (retrograde ejaculation) may occur. Since the semen passes into the bladder, cloudy urine can occur the first time you urinate after intercourse. Or, you may not have an ejaculation during erection. Ask your caregiver when you can resume sexual activity. Retrograde ejaculation and reduced semen discharge should not reduce one's pleasure of intercourse. Postoperative Visit  Arrange the date and time of your after surgery visit with your caregiver. Return to Work  After your recovery is complete, you will be able to return to work and resume all activities. Your caregiver will inform you when you can return to work. Foley Catheter Care A soft, flexible tube (Foley catheter) has been placed in your bladder to drain urine and fluid. Follow these instructions: Taking Care of the Catheter  Keep the area where the catheter leaves your body clean.  Attach the catheter to the leg so there is no tension on the catheter.  Keep the drainage bag below the level of the bladder, but keep it OFF the floor.  Do not take long soaking baths. Your  caregiver will give instructions about showering.  Wash your hands before touching ANYTHING related to the catheter or bag.  Using mild soap and warm water on a washcloth:  Clean the area closest to the catheter insertion site using a circular motion around the catheter.  Clean the catheter itself by wiping AWAY from the insertion site for several inches down the tube.  NEVER wipe upward as this could sweep bacteria up into the urethra (tube in your body that normally drains the bladder) and cause infection.  Place a small amount of sterile lubricant at the tip of the penis where the catheter is entering. Taking Care of the Drainage Bags  Two drainage bags will be taken home: a large overnight drainage bag, and a smaller leg bag which fits underneath clothing.  It is okay to wear the overnight bag at any time, but NEVER wear the smaller leg bag at night.  Keep the drainage bag well below the level of your bladder. This prevents backflow of urine into the bladder and allows the urine to drain freely.  Anchor the tubing  to your leg to prevent pulling or tension on the catheter. Use tape or a leg strap provided by the hospital.  Empty the drainage bag when it is 1/2 to 3/4 full. Wash your hands before and after touching the bag.  Periodically check the tubing for kinks to make sure there is no pressure on the tubing which could restrict the flow of urine. Changing the Drainage Bags  Cleanse both ends of the clean bag with alcohol before changing.  Pinch off the rubber catheter to avoid urine spillage during the disconnection.  Disconnect the dirty bag and connect the clean one.  Empty the dirty bag carefully to avoid a urine spill.  Attach the new bag to the leg with tape or a leg strap. Cleaning the Drainage Bags  Whenever a drainage bag is disconnected, it must be cleaned quickly so it is ready for the next use.  Wash the bag in warm, soapy water.  Rinse the bag thoroughly  with warm water.  Soak the bag for 30 minutes in a solution of white vinegar and water (1 cup vinegar to 1 quart warm water).  Rinse with warm water. SEEK MEDICAL CARE IF:   You have chills or night sweats.  You are leaking around your catheter or have problems with your catheter. It is not uncommon to have sporadic leakage around your catheter as a result of bladder spasms. If the leakage stops, there is not much need for concern. If you are uncertain, call your caregiver.  You develop side effects that you think are coming from your medicines. SEEK IMMEDIATE MEDICAL CARE IF:   You are suddenly unable to urinate. Check to see if there are any kinks in the drainage tubing that may cause this. If you cannot find any kinks, call your caregiver immediately. This is an emergency.  You develop shortness of breath or chest pains.  Bleeding persists or clots develop in your urine.  You have a fever.  You develop pain in your back or over your lower belly (abdomen).  You develop pain or swelling in your legs.  Any problems you are having get worse rather than better. MAKE SURE YOU:   Understand these instructions.  Will watch your condition.  Will get help right away if you are not doing well or get worse. Document Released: 08/07/2005 Document Revised: 10/30/2011 Document Reviewed: 09/15/2011 [facility] Patient Information 2013 [place]. Transurethral Resection of the Prostate Transurethral Resection of the Prostate (TURP) is often treatment for non-cancerous (benign) prostatic hyperplasia (BPH) or prostate cancer. BPH commonly begins in middle aged men and can cause symptoms at any age thereafter. The complications that stem from these problems, such as recurrent infection and bladder control and emptying problems, are often helped by this procedure. Both conditions usually cause the prostate to increase in size. TURP is a major surgery that removes part of the prostate gland.  The goal is to remove enough prostate to allow for unobstructed flow of urine. TREATMENT This surgery (TURP procedure) is done using an instrument like a narrow telescope to look into your bladder. This is then used to remove enlarged pieces of your prostate, one piece at a time. This removes the blockage and makes it easier for you to urinate. This is called a transurethral resection of the prostate. A lesser procedure is sometimes done. In this procedure, small cuts are made in the prostate. This lessens the prostates pressure on the urethra. This is called a transurethral incision (cut by  the surgeon) of the prostate (TUIP). You will probably be comfortable soon after the operation, but it will take 10 to 12 weeks, or longer, for your prostate to heal after you leave the hospital.  LET YOUR CAREGIVER KNOW ABOUT:  Allergies.  Medications taken including herbs, eye drops, over the counter medications, and creams.  Use of steroids (by mouth or creams).  Previous problems with anesthetics or Novocaine.  History of blood clots (thrombophlebitis).  History of bleeding or blood problems.  Previous surgery.  Previous prostate infections.  Other health problems. RISKS AND COMPLICATIONS  Rare injury to the bowel (intestine).  Rare injury to the bladder or adjacent blood vessels.  Intestinal/bowel obstruction.  Scarring called "stricture" that cause later problems with the flow of urine.  Bleeding and the need for blood transfusion (more common in those with prostate cancer and those who have previously received radiation therapy).  Inability to control your urine (incontinence). This is more common in those with prostate cancer and those who have received radiation therapy.  Injury to one of the ureters (tubes that drain the kidneys into the bladder) and/or urethra (the tube that drains the bladder).  Injury to the capsule that holds the prostate. This can lead to leakage of fluid and  urine into the belly (abdomen).  The operation can possibly lead to impotence. This is the inability to get an erection. Treatments are available for these types of problems.  Rare over absorption of the fluids used during the operation. This can then cause low blood levels of sodium, brain swelling, strain on the heart, and fluid accumulation in the lungs. This is more common in long procedures.  Infection.  Blood clots in the legs.  As with any major surgery, there is always the rare chance of a complicating stroke, heart attack, or other complications that will be discussed with you by the surgeon and anesthesiologist. [place] may be asked to temporarily adjust your diet. If so, your caregiver will give you specific recommendations.  If you are on blood thinners, stop taking them before the operation, or as your caregiver advises.  You should have nothing to eat or drink after midnight prior to your surgery or as suggested by your caregiver. You may have a sip of water to take medications not stopped for the procedure PROCEDURE  This operation is performed after you have been given a medication to help you sleep (anesthetic), or with a spinal block. The spinal block keeps you awake but numb from the waist down.  No cut or incision is needed. During the operation, your surgeon passes a viewing and cutting instrument (resectoscope) through the penis into the prostate gland. The instrument contains an electric cutting edge. From inside the prostate, this cutter is used to remove part of the prostate.  HOME CARE INSTRUCTIONS  For your own protection, observe the following precautions for 10 days after your operation.  You may go home with a catheter. Take care of it as directed. You will receive instruction on catheter care.  After catheter removal, empty the bladder whenever you feel a definite desire. Do not try to hold the urine for long periods of time.  For 10 days,  avoid all lifting, straining, running, strenuous work, walks longer than a couple blocks, riding in a car for extended periods, and sexual relations.  Take 2 tablespoons of heavy mineral oil or Metamucil night and morning for 3 or 4 days. After that, gradually reduce  the dose to one or two teaspoons twice daily. Stop it after the stools have been normal for a week. If you become constipated, do not strain to move your bowels. You may use an enema. Notify your caregiver about problems.  Even after complete healing, you may continue to urinate once or twice during the night.  In addition to your usual medications, you may be given an antibiotic to take for 10-14 days. Notify your caregiver if you have any side effects or problems with the medication.  Avoid alcohol and caffeinated drinks for 2 weeks, as they are irritating to the bladder. Decaffeinated drinks are fine.  Eat a regular diet, avoiding spicy foods for 2 weeks.  You may continue non-strenuous activities. It is always important to keep active after an operation. This lessens the chance of developing blood clots.  You may see some recurrence of blood in the urine after discharge from the hospital. Even a small amount of blood colors the urine very red. If this occurs, force fluids again as you did in the hospital. This is generally not a concern. SEEK MEDICAL CARE IF:   You have chills or night sweats.  You are leaking around your catheter or have problems with your catheter.  You develop side effects that you think are coming from your medications. SEEK IMMEDIATE MEDICAL CARE IF:   You are suddenly unable to urinate. This is an emergency.  You develop shortness of breath or chest pains.  Bleeding persists or clots develop.  You have a fever.  You develop pain in your back or over your lower belly (abdomen).  You develop pain or swelling in your legs.  You develop swelling in your abdomen or have a sudden weight  gain.  The problems get worse rather than better. Document Released: 08/07/2005 Document Revised: 10/30/2011 Document Reviewed: 01/08/2012 [facility] Patient Information 2013 [place].

## 2012-12-27 ENCOUNTER — Ambulatory Visit (HOSPITAL_COMMUNITY): Payer: Medicare Other | Admitting: Anesthesiology

## 2012-12-27 ENCOUNTER — Encounter (HOSPITAL_COMMUNITY): Payer: Self-pay | Admitting: Anesthesiology

## 2012-12-27 ENCOUNTER — Encounter (HOSPITAL_COMMUNITY): Payer: Self-pay | Admitting: *Deleted

## 2012-12-27 ENCOUNTER — Encounter (HOSPITAL_COMMUNITY)
Admission: RE | Disposition: A | Discharge status: Home / Self Care | Payer: Self-pay | Source: Ambulatory Visit | Attending: Urology

## 2012-12-27 ENCOUNTER — Observation Stay (HOSPITAL_COMMUNITY)
Admission: RE | Admit: 2012-12-27 | Discharge: 2012-12-28 | Disposition: A | Discharge status: Home / Self Care | Payer: Medicare Other | Source: Ambulatory Visit | Attending: Urology | Admitting: Urology

## 2012-12-27 DIAGNOSIS — R197 Diarrhea, unspecified: Secondary | ICD-10-CM

## 2012-12-27 DIAGNOSIS — N182 Chronic kidney disease, stage 2 (mild): Secondary | ICD-10-CM

## 2012-12-27 DIAGNOSIS — Z01812 Encounter for preprocedural laboratory examination: Secondary | ICD-10-CM | POA: Insufficient documentation

## 2012-12-27 DIAGNOSIS — Z8711 Personal history of peptic ulcer disease: Secondary | ICD-10-CM

## 2012-12-27 DIAGNOSIS — I4891 Unspecified atrial fibrillation: Secondary | ICD-10-CM

## 2012-12-27 DIAGNOSIS — D649 Anemia, unspecified: Secondary | ICD-10-CM

## 2012-12-27 DIAGNOSIS — R310 Gross hematuria: Secondary | ICD-10-CM | POA: Insufficient documentation

## 2012-12-27 DIAGNOSIS — I10 Essential (primary) hypertension: Secondary | ICD-10-CM | POA: Insufficient documentation

## 2012-12-27 DIAGNOSIS — R42 Dizziness and giddiness: Secondary | ICD-10-CM

## 2012-12-27 DIAGNOSIS — R079 Chest pain, unspecified: Secondary | ICD-10-CM

## 2012-12-27 DIAGNOSIS — N401 Enlarged prostate with lower urinary tract symptoms: Principal | ICD-10-CM | POA: Insufficient documentation

## 2012-12-27 DIAGNOSIS — R7301 Impaired fasting glucose: Secondary | ICD-10-CM

## 2012-12-27 DIAGNOSIS — Z8719 Personal history of other diseases of the digestive system: Secondary | ICD-10-CM

## 2012-12-27 HISTORY — PX: TRANSURETHRAL RESECTION OF PROSTATE: SHX73

## 2012-12-27 SURGERY — TURP (TRANSURETHRAL RESECTION OF PROSTATE)
Anesthesia: Spinal | Wound class: Clean Contaminated

## 2012-12-27 MED ORDER — EPHEDRINE SULFATE 50 MG/ML IJ SOLN
INTRAMUSCULAR | Status: AC
  Filled 2012-12-27: qty 1

## 2012-12-27 MED ORDER — MIDAZOLAM HCL 5 MG/5ML IJ SOLN
INTRAMUSCULAR | Status: DC | PRN
  Administered 2012-12-27: 2 mg via INTRAVENOUS

## 2012-12-27 MED ORDER — MIDAZOLAM HCL 2 MG/2ML IJ SOLN
INTRAMUSCULAR | Status: AC
  Filled 2012-12-27: qty 2

## 2012-12-27 MED ORDER — GLYCINE 1.5 % IR SOLN
Status: DC | PRN
  Administered 2012-12-27 (×4): 3000 mL

## 2012-12-27 MED ORDER — PROPOFOL 10 MG/ML IV EMUL
INTRAVENOUS | Status: AC
  Filled 2012-12-27: qty 20

## 2012-12-27 MED ORDER — STERILE WATER FOR IRRIGATION IR SOLN
Status: DC | PRN
  Administered 2012-12-27: 1000 mL

## 2012-12-27 MED ORDER — PROPOFOL INFUSION 10 MG/ML OPTIME
INTRAVENOUS | Status: DC | PRN
  Administered 2012-12-27: 45 ug/kg/min via INTRAVENOUS

## 2012-12-27 MED ORDER — CHLORHEXIDINE GLUCONATE CLOTH 2 % EX PADS: 6.0000 | MEDICATED_PAD | Freq: Every day | CUTANEOUS | Status: DC

## 2012-12-27 MED ORDER — ONDANSETRON HCL 4 MG/2ML IJ SOLN
INTRAMUSCULAR | Status: AC
  Filled 2012-12-27: qty 2

## 2012-12-27 MED ORDER — FENTANYL CITRATE 0.05 MG/ML IJ SOLN
INTRAMUSCULAR | Status: AC
  Filled 2012-12-27: qty 2

## 2012-12-27 MED ORDER — SODIUM CHLORIDE 0.9 % IR SOLN
Status: DC | PRN
  Administered 2012-12-27: 3000 mL

## 2012-12-27 MED ORDER — BUPIVACAINE IN DEXTROSE 0.75-8.25 % IT SOLN
INTRATHECAL | Status: AC
  Filled 2012-12-27: qty 2

## 2012-12-27 MED ORDER — BUPIVACAINE IN DEXTROSE 0.75-8.25 % IT SOLN
INTRATHECAL | Status: DC | PRN
  Administered 2012-12-27: 15 mg via INTRATHECAL

## 2012-12-27 MED ORDER — ONDANSETRON HCL 4 MG/2ML IJ SOLN: 4.0000 mg | Freq: Once | INTRAMUSCULAR | Status: DC | PRN

## 2012-12-27 MED ORDER — HYDROMORPHONE HCL PF 1 MG/ML IJ SOLN
0.5000 mg | INTRAMUSCULAR | Status: DC | PRN
  Administered 2012-12-27: 0.5 mg via INTRAVENOUS
  Filled 2012-12-27 (×2): qty 1

## 2012-12-27 MED ORDER — GLYCINE 1.5 % IR SOLN
Status: DC | PRN
  Administered 2012-12-27 (×2): 3000 mL

## 2012-12-27 MED ORDER — MIDAZOLAM HCL 2 MG/2ML IJ SOLN
1.0000 mg | INTRAMUSCULAR | Status: DC | PRN
Start: 2012-12-27 — End: 2012-12-27
  Administered 2012-12-27: 2 mg via INTRAVENOUS

## 2012-12-27 MED ORDER — FUROSEMIDE 40 MG PO TABS: 40.0000 mg | ORAL_TABLET | Freq: Every day | ORAL | Status: DC | PRN

## 2012-12-27 MED ORDER — CIPROFLOXACIN IN D5W 200 MG/100ML IV SOLN
INTRAVENOUS | Status: AC
  Filled 2012-12-27: qty 100

## 2012-12-27 MED ORDER — MUPIROCIN 2 % EX OINT
1.0000 "application " | TOPICAL_OINTMENT | Freq: Two times a day (BID) | CUTANEOUS | Status: DC
  Administered 2012-12-27: 1 via NASAL
  Filled 2012-12-27: qty 22

## 2012-12-27 MED ORDER — HYDROMORPHONE HCL PF 1 MG/ML IJ SOLN
0.5000 mg | INTRAMUSCULAR | Status: DC | PRN
  Administered 2012-12-27: 0.5 mg via INTRAVENOUS
  Filled 2012-12-27: qty 1

## 2012-12-27 MED ORDER — FENTANYL CITRATE 0.05 MG/ML IJ SOLN: 25.0000 ug | INTRAMUSCULAR | Status: DC | PRN

## 2012-12-27 MED ORDER — FENTANYL CITRATE 0.05 MG/ML IJ SOLN
INTRAMUSCULAR | Status: DC | PRN
  Administered 2012-12-27: 25 ug via INTRATHECAL

## 2012-12-27 MED ORDER — CIPROFLOXACIN IN D5W 400 MG/200ML IV SOLN
INTRAVENOUS | Status: DC | PRN
  Administered 2012-12-27: 200 mg via INTRAVENOUS

## 2012-12-27 MED ORDER — OXYMETAZOLINE HCL 0.05 % NA SOLN
2.0000 | Freq: Every day | NASAL | Status: DC
  Administered 2012-12-27: 2 via NASAL
  Filled 2012-12-27: qty 15

## 2012-12-27 MED ORDER — LACTATED RINGERS IV SOLN
INTRAVENOUS | Status: DC
  Administered 2012-12-27 (×2): via INTRAVENOUS

## 2012-12-27 MED ORDER — GABAPENTIN 300 MG PO CAPS
300.0000 mg | ORAL_CAPSULE | Freq: Every day | ORAL | Status: DC
  Administered 2012-12-27: 300 mg via ORAL
  Filled 2012-12-27: qty 1

## 2012-12-27 SURGICAL SUPPLY — 39 items
BAG DECANTER FOR FLEXI CONT (MISCELLANEOUS) ×2 IMPLANT
BAG DRAIN URO TABLE W/ADPT NS (DRAPE) ×2 IMPLANT
BAG DRN 8 ADPR NS SKTRN CSTL (DRAPE) ×1
BAG DRN URN TUBE DRIP CHMBR (OSTOMY) ×1
BAG URINE DRAIN TURP 4L (OSTOMY) ×2 IMPLANT
CABLE HI FREQUENCY MONOPOLAR (ELECTROSURGICAL) ×2 IMPLANT
CATH FOLEY 3WAY 30CC 22F (CATHETERS) ×2 IMPLANT
CLOTH BEACON ORANGE TIMEOUT ST (SAFETY) ×2 IMPLANT
CONNECTOR 5 IN 1 STRAIGHT STRL (MISCELLANEOUS) ×2 IMPLANT
DRAPE STERI URO 23X35 APER SZ5 (DRAPE) ×2 IMPLANT
DRAPE WARM FLUID 44X44 (DRAPE) ×2 IMPLANT
ELECT CUT LOOP C-MAX 27FR .012 (CUTTING LOOP) ×2
ELECT REM PT RETURN 9FT ADLT (ELECTROSURGICAL) ×2
ELECTRODE CUT LP CMX 27FR .012 (CUTTING LOOP) IMPLANT
ELECTRODE REM PT RTRN 9FT ADLT (ELECTROSURGICAL) ×1 IMPLANT
FLOOR PAD 36X40 (MISCELLANEOUS) ×2
FORMALIN 10 PREFIL 480ML (MISCELLANEOUS) ×2 IMPLANT
GLOVE BIO SURGEON STRL SZ7 (GLOVE) ×2 IMPLANT
GLOVE BIOGEL PI IND STRL 6.5 (GLOVE) IMPLANT
GLOVE BIOGEL PI IND STRL 7.0 (GLOVE) IMPLANT
GLOVE BIOGEL PI INDICATOR 6.5 (GLOVE) ×1
GLOVE BIOGEL PI INDICATOR 7.0 (GLOVE) ×1
GLOVE EXAM NITRILE MD LF STRL (GLOVE) ×1 IMPLANT
GLOVE SS BIOGEL STRL SZ 6.5 (GLOVE) IMPLANT
GLOVE SUPERSENSE BIOGEL SZ 6.5 (GLOVE) ×1
GLYCINE 1.5% IRRIG UROMATIC (IV SOLUTION) ×10 IMPLANT
GOWN STRL REIN XL XLG (GOWN DISPOSABLE) ×3 IMPLANT
IV NS IRRIG 3000ML ARTHROMATIC (IV SOLUTION) ×2 IMPLANT
KIT ROOM TURNOVER AP CYSTO (KITS) ×2 IMPLANT
MANIFOLD NEPTUNE II (INSTRUMENTS) ×2 IMPLANT
PACK CYSTO (CUSTOM PROCEDURE TRAY) ×2 IMPLANT
PAD ARMBOARD 7.5X6 YLW CONV (MISCELLANEOUS) ×2 IMPLANT
PAD FLOOR 36X40 (MISCELLANEOUS) ×1 IMPLANT
SET IRRIGATING DISP (SET/KITS/TRAYS/PACK) ×2 IMPLANT
SYR 30ML LL (SYRINGE) ×2 IMPLANT
TOWEL OR 17X26 4PK STRL BLUE (TOWEL DISPOSABLE) ×2 IMPLANT
WATER STERILE IRR 1000ML POUR (IV SOLUTION) ×2 IMPLANT
XPEEDA 550 SIDEFIRING FIBER (MISCELLANEOUS) IMPLANT
YANKAUER SUCT BULB TIP 10FT TU (MISCELLANEOUS) ×2 IMPLANT

## 2012-12-27 NOTE — Anesthesia Preprocedure Evaluation (Signed)
Anesthesia Evaluation  Patient identified by MRN, date of birth, ID band Patient awake    Reviewed: Allergy & Precautions, H&P , NPO status , Patient's Chart, lab work & pertinent test results  Airway Mallampati: I TM Distance: >3 FB Neck ROM: Full    Dental  (+) Upper Dentures and Lower Dentures   Pulmonary neg pulmonary ROS,    Pulmonary exam normal       Cardiovascular hypertension, Pt. on medications + dysrhythmias Atrial Fibrillation Rhythm:Irregular Rate:Normal     Neuro/Psych negative neurological ROS     GI/Hepatic Neg liver ROS, PUD,   Endo/Other  negative endocrine ROS  Renal/GU Renal InsufficiencyRenal disease     Musculoskeletal   Abdominal Normal abdominal exam  (+)   Peds  Hematology negative hematology ROS (+)   Anesthesia Other Findings   Reproductive/Obstetrics                           Anesthesia Physical Anesthesia Plan  ASA: III  Anesthesia Plan: Spinal   Post-op Pain Management:    Induction: Intravenous  Airway Management Planned: Nasal Cannula  Additional Equipment:   Intra-op Plan:   Post-operative Plan:   Informed Consent: I have reviewed the patients History and Physical, chart, labs and discussed the procedure including the risks, benefits and alternatives for the proposed anesthesia with the patient or authorized representative who has indicated his/her understanding and acceptance.     Plan Discussed with: CRNA  Anesthesia Plan Comments:         Anesthesia Quick Evaluation

## 2012-12-27 NOTE — Progress Notes (Signed)
UR chart review completed.  

## 2012-12-27 NOTE — Anesthesia Procedure Notes (Signed)
Spinal  Patient location during procedure: OR Start time: 12/27/2012 8:18 AM Staffing CRNA/Resident: Kenslee Achorn J Performed by: resident/CRNA  Preanesthetic Checklist Completed: patient identified, site marked, surgical consent, pre-op evaluation, timeout performed, IV checked, risks and benefits discussed and monitors and equipment checked Spinal Block Patient position: right lateral decubitus Prep: ChloraPrep Patient monitoring: blood pressure, continuous pulse ox, cardiac monitor and heart rate Approach: midline Location: L2-3 Injection technique: single-shot Needle Needle type: Spinocan  Needle gauge: 22 G Assessment Sensory level: T8 Additional Notes CSF slow and clear; 5 inch spinal needle used       [id number]       07/2013

## 2012-12-27 NOTE — H&P (Signed)
NAME:  Bernard Ramirez, Bernard Ramirez NO.:  [id number]  MEDICAL RECORD NO.:  [id number]  LOCATION:                                 FACILITY:  PHYSICIAN:  Marissa Nestle, M.D.DATE OF BIRTH:  05-15-1931  DATE OF ADMISSION:  12/27/2012 DATE OF DISCHARGE:  LH                             HISTORY & PHYSICAL   Bernard Ramirez is an 79 year old gentleman who is having several episodes of gross total painless hematuria last time a few months ago when he had hematuria he had a workup done.  He was found to be bleeding from the prostatic urethra, although I have done TUR prostate several years ago, but he has a regrowth of the adenoma and he was bleeding from that. Now, he had a 2nd episode of hematuria and he was seen in the emergency room by me recently.  Foley catheter was inserted.  He was sent home with a Foley catheter.  Gross hematuria has subsided.  He also told me that since last time he went to see his family physician, he was found to have atrial fibrillation, so they put him on anticoagulation which he was taking and he started to have bleeding.  I have send him to his family physician, who has discontinued his anticoagulation and I have scheduled him to have a TUR prostate and he has a regrowth of the adenoma so he will undergo TUR prostate.  He is familiar with the procedure sedimentation complications.  I have told him there is no guarantee it will work but we need to remove that regrowth of the adenoma.  I had told him last time that the need of a TUR prostate for regrowth of this adenoma but he was waiting until he had this hematuria, so I am going to go ahead and schedule him to do this tomorrow as outpatient.  He will stay overnight in the hospital.  Next day if everything okay, he will go home.  I will send him home with the catheter.  His PSA was also slightly elevated when he is 79 years old, and I will just wait for the path report when it comes back.  PERSONAL  HISTORY:  He does not smoke or drink.  REVIEW OF SYSTEMS:  Unremarkable.  I have told him when I cystoscoped him because I did not cystoscoped him at this time, I do not know if he has any other pathology in the bladder.  If he has another pathology in the bladder, then I will have to cancel the procedure and take care of the problem at a later date.  On examination, well-nourished, well- developed male, not in acute distress fully conscious, alert, oriented, agitated.  He does suffer from erectile dysfunction.  PHYSICAL EXAMINATION:  VITAL SIGNS:  His blood pressure is 140/80, temperature is normal. CENTRAL NERVOUS SYSTEM:  No gross neurological deficit. HEAD, NECK, EYE, ENT:  Negative. CHEST:  Symmetrical.  Normal breath sounds. HEART:  Regular sinus rhythm.  No murmur. ABDOMEN:  Soft, flat.  Liver, spleen, kidneys are not palpable.  No CVA tenderness. GU:  External genitalia is uncircumcised.  Meatus adequate.  Testicles are normal. RECTAL:  No rectal mass.  Prostate 2+ smooth and firm.  IMPRESSION:  Benign prostatic hypertrophy with regrowth of the adenoma. He has atrial fibrillation for which he is taking anticoagulation which have discontinued as per advice from Dr. Luan Pulling his family physician.  PLAN:  TUR prostate under anesthesia as outpatient.     Marissa Nestle, M.D.     MIJ/MEDQ  D:  12/26/2012  T:  12/27/2012  Job:  [id number]  cc:   Luan Pulling

## 2012-12-27 NOTE — Transfer of Care (Signed)
Immediate Anesthesia Transfer of Care Note  Patient: Bernard Ramirez  Procedure(s) Performed: Procedure(s): TRANSURETHRAL RESECTION OF THE PROSTATE (TURP) (N/A)  Patient Location: PACU  Anesthesia Type:Spinal  Level of Consciousness: awake and patient cooperative  Airway & Oxygen Therapy: Patient Spontanous Breathing and Patient connected to nasal cannula oxygen  Post-op Assessment: Report given to PACU RN and Post -op Vital signs reviewed and stable  Post vital signs: Reviewed and stable  Complications: No apparent anesthesia complications

## 2012-12-27 NOTE — Progress Notes (Signed)
No change in H&P on reexamination. 

## 2012-12-27 NOTE — Brief Op Note (Signed)
12/27/2012  9:15 AM  PATIENT:  Bernard Ramirez  79 y.o. male  PRE-OPERATIVE DIAGNOSIS:  Gross Hematuria, Benign prostatic hyperplasia  POST-OPERATIVE DIAGNOSIS:  Gross Hematuria, Benign prostatic hyperplasia  PROCEDURE:  Procedure(s): TRANSURETHRAL RESECTION OF THE PROSTATE (TURP) (N/A)  SURGEON:  Surgeon(s) and Role:    * Marissa Nestle, MD - Primary  PHYSICIAN ASSISTANT:   ASSISTANTS: none   ANESTHESIA:   spinal  EBL:  Total I/O In: 1000 [I.V.:1000] Out: 100 [Urine:100]  BLOOD ADMINISTERED:none  DRAINS: Urinary Catheter (Suprapubic)   LOCAL MEDICATIONS USED:  NONE  SPECIMEN:  Source of Specimen:  prostate chips  DISPOSITION OF SPECIMEN:  PATHOLOGY  COUNTS:  YES  TOURNIQUET:  * No tourniquets in log *  DICTATION: .Other Dictation: Dictation Number dictation [id number]  PLAN OF CARE: Admit for overnight observation  PATIENT DISPOSITION:  PACU - hemodynamically stable.   Delay start of Pharmacological VTE agent (>24hrs) due to surgical blood loss or risk of bleeding:

## 2012-12-27 NOTE — Anesthesia Postprocedure Evaluation (Signed)
  Anesthesia Post-op Note  Patient: Bernard Ramirez  Procedure(s) Performed: Procedure(s): TRANSURETHRAL RESECTION OF THE PROSTATE (TURP) (N/A)  Patient Location: PACU  Anesthesia Type:Spinal  Level of Consciousness: awake, alert , oriented and patient cooperative  Airway and Oxygen Therapy: Patient Spontanous Breathing  Post-op Pain: 2 /10, mild  Post-op Assessment: Post-op Vital signs reviewed, Patient's Cardiovascular Status Stable, Respiratory Function Stable, Patent Airway, No signs of Nausea or vomiting and Pain level controlled  Post-op Vital Signs: Reviewed and stable  Complications: No apparent anesthesia complications

## 2012-12-28 LAB — CBC
MCH: 25.4 pg — ABNORMAL LOW (ref 26.0–34.0)
MCV: 79 fL (ref 78.0–100.0)
Platelets: 184 10*3/uL (ref 150–400)
RDW: 15.1 % (ref 11.5–15.5)
WBC: 7.2 10*3/uL (ref 4.0–10.5)

## 2012-12-28 LAB — BASIC METABOLIC PANEL
Calcium: 9.3 mg/dL (ref 8.4–10.5)
Chloride: 97 mEq/L (ref 96–112)
Creatinine, Ser: 1.04 mg/dL (ref 0.50–1.35)
GFR calc Af Amer: 77 mL/min — ABNORMAL LOW (ref >90)
GFR calc non Af Amer: 66 mL/min — ABNORMAL LOW (ref >90)

## 2012-12-28 NOTE — Op Note (Signed)
NAME:  Bernard Ramirez, Bernard Ramirez NO.:  [id number]  MEDICAL RECORD NO.:  [id number]  LOCATION:  A227                          FACILITY:  [facility]  PHYSICIAN:  Marissa Nestle, M.D.DATE OF BIRTH:  August 10, 1931  DATE OF PROCEDURE: DATE OF DISCHARGE:                              OPERATIVE REPORT   PREOPERATIVE DIAGNOSIS:  Recurrent prostatic adenoma and recurrent gross hematuria.  POSTOPERATIVE DIAGNOSIS:  Recurrent prostatic adenoma and recurrent gross hematuria plus hemorrhaging cystitis.  PROCEDURE:  TUR prostate.  ANESTHESIA:  Spinal.  DESCRIPTION OF PROCEDURE:  Patient was given spinal anesthesia in lithotomy position.  After usual prep and drape, a #28 Iglesias resectoscope was introduced into the bladder which was inspected.  There was no evidence of any tumor, stone, foreign body, but there is hemorrhagic cystitis.  Prostatic urethra shows partial bladder neck obstruction with regrowth of the adenoma and it is showing some areas of bleeding in the prostatic urethra.  There is residual tissue in the lateral lobes, so resectoscope was pulled back at the level of the verumontanum.  Right lobe was resected first, then the left lobe and most of the obstructing tissue was removed.  There was hardly any residual tissue left there.  Verumontanum and the sphincter was intact. Chips were evacuated.  Bleeders were coagulated.  Resectoscope was removed.  The 22 three-way Foley catheter left in for drainage.  CBI started which was clear.  Patient left the operating room in satisfactory condition.     Marissa Nestle, M.D.     MIJ/MEDQ  D:  12/27/2012  T:  12/28/2012  Job:  [id number]

## 2012-12-28 NOTE — Progress Notes (Signed)
Patient discharged home with wife.  Instructed on foley care and given leg bag and overnight bag.  Patient has had catheter at home prior to admission and is knowledgeable about care.  Instructed not to take any blood thinners.  Follow up with Dr. Michela Pitcher in place for Monday at 10 in office.  Urine clear with pink tint prior to discharge - no clots noted.  Patient instructed to drink plenty of fluids and to call MD for any profuse bleeding, pain, or fever.  Patient has no questions at this time - stable to discharge home.

## 2012-12-28 NOTE — Progress Notes (Signed)
Afebrile feels fine cbi clear. Will dc home with foley catheter.will see in office Monday 10am to dc foley.may resume home meds except blood thinner.

## 2012-12-28 NOTE — Anesthesia Postprocedure Evaluation (Signed)
  Anesthesia Post-op Note  Patient: Bernard Ramirez  Procedure(s) Performed: Procedure(s): TRANSURETHRAL RESECTION OF THE PROSTATE (TURP) (N/A)  Patient Location: room 227  Anesthesia Type:Spinal  Level of Consciousness: awake, alert , oriented and patient cooperative  Airway and Oxygen Therapy: Patient Spontanous Breathing and Patient connected to nasal cannula oxygen  Post-op Pain: 2 /10, mild  Post-op Assessment: Post-op Vital signs reviewed, Patient's Cardiovascular Status Stable, Respiratory Function Stable, Patent Airway, No signs of Nausea or vomiting, Adequate PO intake and Pain level controlled  Post-op Vital Signs: Reviewed and stable  Complications: No apparent anesthesia complications

## 2012-12-30 NOTE — Addendum Note (Signed)
Addendum created 12/30/12 1332 by Vista Deck, CRNA   Modules edited: Charges VN

## 2012-12-31 ENCOUNTER — Encounter (HOSPITAL_COMMUNITY): Payer: Self-pay | Admitting: Urology

## 2013-01-10 NOTE — Discharge Summary (Signed)
Note [id number]

## 2013-01-10 NOTE — Discharge Summary (Signed)
Bernard Ramirez, Bernard Ramirez NO.:  [id number]  MEDICAL RECORD NO.:  [id number]  LOCATION:  A227                          FACILITY:  [facility]  PHYSICIAN:  Marissa Nestle, M.D.DATE OF BIRTH:  [birth year]  DATE OF ADMISSION:  12/27/2012 DATE OF DISCHARGE:  05/10/2014LH                              DISCHARGE SUMMARY   CHIEF COMPLAINT:  Regrowth of the adenoma.  This is a 78 year old gentleman who has had several episodes of gross total painless hematuria last time, which was 3 months ago.  He had similar problem and at that time cystoscopy was done.  It showed that he has recurrent adenoma causing bladder neck obstruction.  He is not my symptomatic, but keeps having bleeding, so I decided to go ahead and resect the remaining part of the adenoma.  I have told this to the patient and his wife.  They understand.  He underwent routine preadmission workup which is normal.  He was taken to the operating room, where TUR prostate was done.  It should be also mentioned recently he was diagnosed with atrial fibrillation, and he has been placed on anticoagulation, which was preoperatively discontinued.  His INR appears to be satisfactory.  So as I said, he was taken to the operating room, a TUR prostate was done.  Postoperative course was benign.  Neck day his CVA is clear.  He is up and about, eating regular diet, and his urine is clear.  So I am going to discharge him home.  We will follow him in the office.  He is going home with Foley catheter, and I have told him that he can wait and see his family physician to start his anticoagulation. He cannot start his anticoagulation until the bleeding has completely subsided.  So, I will take the catheter out in couple of days.  FINAL DISCHARGE DIAGNOSIS:  Benign prostatic hypertrophy.  His final pathology report is still pending.     Marissa Nestle, M.D.     MIJ/MEDQ  D:  01/10/2013  T:  01/10/2013  Job:  [id number]

## 2013-01-23 NOTE — Progress Notes (Signed)
REVIEWED.  

## 2013-05-12 ENCOUNTER — Encounter: Payer: Self-pay | Admitting: *Deleted

## 2013-07-04 ENCOUNTER — Other Ambulatory Visit (HOSPITAL_COMMUNITY): Payer: Self-pay | Admitting: Pulmonary Disease

## 2013-07-04 DIAGNOSIS — R52 Pain, unspecified: Secondary | ICD-10-CM

## 2013-07-09 ENCOUNTER — Ambulatory Visit (HOSPITAL_COMMUNITY)
Admission: RE | Admit: 2013-07-09 | Discharge: 2013-07-09 | Disposition: A | Discharge status: Home / Self Care | Payer: Medicare Other | Source: Ambulatory Visit | Attending: Pulmonary Disease | Admitting: Pulmonary Disease

## 2013-07-09 DIAGNOSIS — M79609 Pain in unspecified limb: Secondary | ICD-10-CM | POA: Insufficient documentation

## 2013-07-09 DIAGNOSIS — M51379 Other intervertebral disc degeneration, lumbosacral region without mention of lumbar back pain or lower extremity pain: Secondary | ICD-10-CM | POA: Insufficient documentation

## 2013-07-09 DIAGNOSIS — M5380 Other specified dorsopathies, site unspecified: Secondary | ICD-10-CM | POA: Insufficient documentation

## 2013-07-09 DIAGNOSIS — R52 Pain, unspecified: Secondary | ICD-10-CM

## 2013-07-09 DIAGNOSIS — M5137 Other intervertebral disc degeneration, lumbosacral region: Secondary | ICD-10-CM | POA: Insufficient documentation

## 2013-07-09 DIAGNOSIS — M545 Low back pain, unspecified: Secondary | ICD-10-CM | POA: Insufficient documentation

## 2013-07-09 DIAGNOSIS — M5126 Other intervertebral disc displacement, lumbar region: Secondary | ICD-10-CM | POA: Insufficient documentation

## 2014-01-05 ENCOUNTER — Ambulatory Visit (HOSPITAL_COMMUNITY)
Admission: RE | Admit: 2014-01-05 | Discharge: 2014-01-05 | Disposition: A | Discharge status: Home / Self Care | Payer: Medicare Other | Source: Ambulatory Visit | Attending: Pulmonary Disease | Admitting: Pulmonary Disease

## 2014-01-05 ENCOUNTER — Other Ambulatory Visit (HOSPITAL_COMMUNITY): Payer: Self-pay | Admitting: Pulmonary Disease

## 2014-01-05 DIAGNOSIS — M25473 Effusion, unspecified ankle: Secondary | ICD-10-CM

## 2014-01-05 DIAGNOSIS — R0989 Other specified symptoms and signs involving the circulatory and respiratory systems: Principal | ICD-10-CM | POA: Insufficient documentation

## 2014-01-05 DIAGNOSIS — I059 Rheumatic mitral valve disease, unspecified: Secondary | ICD-10-CM

## 2014-01-05 DIAGNOSIS — I4891 Unspecified atrial fibrillation: Secondary | ICD-10-CM | POA: Insufficient documentation

## 2014-01-05 DIAGNOSIS — I10 Essential (primary) hypertension: Secondary | ICD-10-CM | POA: Insufficient documentation

## 2014-01-05 DIAGNOSIS — R0609 Other forms of dyspnea: Secondary | ICD-10-CM | POA: Insufficient documentation

## 2014-01-05 NOTE — Progress Notes (Signed)
[facility]  2D Echocardiogram completed.  [place], RDCS 01/05/2014 2:45 PM

## 2014-10-12 DIAGNOSIS — R3915 Urgency of urination: Secondary | ICD-10-CM | POA: Insufficient documentation

## 2014-10-12 DIAGNOSIS — N35011 Post-traumatic bulbous urethral stricture: Secondary | ICD-10-CM | POA: Insufficient documentation

## 2014-10-12 DIAGNOSIS — R350 Frequency of micturition: Secondary | ICD-10-CM | POA: Insufficient documentation

## 2014-11-06 DIAGNOSIS — N401 Enlarged prostate with lower urinary tract symptoms: Secondary | ICD-10-CM | POA: Insufficient documentation

## 2014-11-06 DIAGNOSIS — N529 Male erectile dysfunction, unspecified: Secondary | ICD-10-CM | POA: Insufficient documentation

## 2014-11-06 DIAGNOSIS — R39198 Other difficulties with micturition: Secondary | ICD-10-CM | POA: Insufficient documentation

## 2015-01-08 ENCOUNTER — Other Ambulatory Visit: Payer: Self-pay | Admitting: Physician Assistant

## 2015-03-09 ENCOUNTER — Other Ambulatory Visit: Payer: Self-pay | Admitting: Physician Assistant

## 2015-03-18 ENCOUNTER — Other Ambulatory Visit: Payer: Self-pay | Admitting: Physician Assistant

## 2015-04-16 ENCOUNTER — Ambulatory Visit (INDEPENDENT_AMBULATORY_CARE_PROVIDER_SITE_OTHER): Payer: PPO | Admitting: Urology

## 2015-04-16 DIAGNOSIS — N401 Enlarged prostate with lower urinary tract symptoms: Secondary | ICD-10-CM | POA: Diagnosis not present

## 2015-04-16 DIAGNOSIS — N5201 Erectile dysfunction due to arterial insufficiency: Secondary | ICD-10-CM | POA: Diagnosis not present

## 2015-04-16 DIAGNOSIS — R3912 Poor urinary stream: Secondary | ICD-10-CM | POA: Diagnosis not present

## 2015-05-03 ENCOUNTER — Other Ambulatory Visit (HOSPITAL_COMMUNITY): Payer: Self-pay | Admitting: Pulmonary Disease

## 2015-05-03 ENCOUNTER — Other Ambulatory Visit (HOSPITAL_COMMUNITY)
Admission: RE | Admit: 2015-05-03 | Discharge: 2015-05-03 | Disposition: A | Discharge status: Home / Self Care | Payer: PPO | Source: Ambulatory Visit | Attending: Pulmonary Disease | Admitting: Pulmonary Disease

## 2015-05-03 DIAGNOSIS — K279 Peptic ulcer, site unspecified, unspecified as acute or chronic, without hemorrhage or perforation: Secondary | ICD-10-CM

## 2015-05-03 DIAGNOSIS — K921 Melena: Secondary | ICD-10-CM | POA: Insufficient documentation

## 2015-05-03 DIAGNOSIS — I482 Chronic atrial fibrillation: Secondary | ICD-10-CM | POA: Diagnosis present

## 2015-05-03 DIAGNOSIS — I10 Essential (primary) hypertension: Secondary | ICD-10-CM | POA: Insufficient documentation

## 2015-05-03 DIAGNOSIS — R531 Weakness: Secondary | ICD-10-CM | POA: Insufficient documentation

## 2015-05-03 LAB — CBC WITH DIFFERENTIAL/PLATELET
BASOS PCT: 0 % (ref 0–1)
Basophils Absolute: 0 10*3/uL (ref 0.0–0.1)
EOS ABS: 0.1 10*3/uL (ref 0.0–0.7)
Eosinophils Relative: 1 % (ref 0–5)
HCT: 36.2 % — ABNORMAL LOW (ref 39.0–52.0)
Hemoglobin: 11.3 g/dL — ABNORMAL LOW (ref 13.0–17.0)
LYMPHS ABS: 1.3 10*3/uL (ref 0.7–4.0)
Lymphocytes Relative: 16 % (ref 12–46)
MCH: 25.1 pg — AB (ref 26.0–34.0)
MCHC: 31.2 g/dL (ref 30.0–36.0)
MCV: 80.3 fL (ref 78.0–100.0)
MONO ABS: 0.5 10*3/uL (ref 0.1–1.0)
MONOS PCT: 6 % (ref 3–12)
Neutro Abs: 6.3 10*3/uL (ref 1.7–7.7)
Neutrophils Relative %: 77 % (ref 43–77)
Platelets: 242 10*3/uL (ref 150–400)
RBC: 4.51 MIL/uL (ref 4.22–5.81)
RDW: 15.4 % (ref 11.5–15.5)
WBC: 8.3 10*3/uL (ref 4.0–10.5)

## 2015-05-03 LAB — PROTIME-INR
INR: 1.19 (ref 0.00–1.49)
PROTHROMBIN TIME: 15.3 s — AB (ref 11.6–15.2)

## 2015-05-06 ENCOUNTER — Ambulatory Visit (HOSPITAL_COMMUNITY)
Admission: RE | Admit: 2015-05-06 | Discharge: 2015-05-06 | Disposition: A | Discharge status: Home / Self Care | Payer: PPO | Source: Ambulatory Visit | Attending: Pulmonary Disease | Admitting: Pulmonary Disease

## 2015-05-06 ENCOUNTER — Other Ambulatory Visit (HOSPITAL_COMMUNITY): Payer: Self-pay | Admitting: Pulmonary Disease

## 2015-05-06 DIAGNOSIS — K921 Melena: Secondary | ICD-10-CM | POA: Diagnosis not present

## 2015-05-06 DIAGNOSIS — K279 Peptic ulcer, site unspecified, unspecified as acute or chronic, without hemorrhage or perforation: Secondary | ICD-10-CM | POA: Insufficient documentation

## 2015-05-10 ENCOUNTER — Encounter: Payer: Self-pay | Admitting: Gastroenterology

## 2015-05-11 ENCOUNTER — Other Ambulatory Visit: Payer: Self-pay

## 2015-05-11 ENCOUNTER — Encounter: Payer: Self-pay | Admitting: Gastroenterology

## 2015-05-11 ENCOUNTER — Ambulatory Visit (HOSPITAL_COMMUNITY)
Admission: AD | Admit: 2015-05-11 | Discharge: 2015-05-11 | Disposition: A | Discharge status: Home / Self Care | Payer: PPO | Source: Ambulatory Visit | Attending: Gastroenterology | Admitting: Gastroenterology

## 2015-05-11 ENCOUNTER — Ambulatory Visit (INDEPENDENT_AMBULATORY_CARE_PROVIDER_SITE_OTHER): Payer: PPO | Admitting: Gastroenterology

## 2015-05-11 ENCOUNTER — Encounter (HOSPITAL_COMMUNITY)
Admission: AD | Disposition: A | Discharge status: Home / Self Care | Payer: Self-pay | Source: Ambulatory Visit | Attending: Gastroenterology

## 2015-05-11 VITALS — BP 135/80 | HR 75 | Temp 97.9°F | Ht 72.0 in | Wt 261.8 lb

## 2015-05-11 DIAGNOSIS — K2960 Other gastritis without bleeding: Secondary | ICD-10-CM | POA: Insufficient documentation

## 2015-05-11 DIAGNOSIS — R9389 Abnormal findings on diagnostic imaging of other specified body structures: Secondary | ICD-10-CM

## 2015-05-11 DIAGNOSIS — K2970 Gastritis, unspecified, without bleeding: Secondary | ICD-10-CM

## 2015-05-11 DIAGNOSIS — D649 Anemia, unspecified: Secondary | ICD-10-CM | POA: Diagnosis not present

## 2015-05-11 DIAGNOSIS — R935 Abnormal findings on diagnostic imaging of other abdominal regions, including retroperitoneum: Secondary | ICD-10-CM | POA: Diagnosis not present

## 2015-05-11 DIAGNOSIS — K625 Hemorrhage of anus and rectum: Secondary | ICD-10-CM | POA: Insufficient documentation

## 2015-05-11 DIAGNOSIS — Z79899 Other long term (current) drug therapy: Secondary | ICD-10-CM | POA: Insufficient documentation

## 2015-05-11 DIAGNOSIS — K6389 Other specified diseases of intestine: Secondary | ICD-10-CM | POA: Insufficient documentation

## 2015-05-11 DIAGNOSIS — Z87891 Personal history of nicotine dependence: Secondary | ICD-10-CM | POA: Diagnosis not present

## 2015-05-11 DIAGNOSIS — I10 Essential (primary) hypertension: Secondary | ICD-10-CM | POA: Diagnosis not present

## 2015-05-11 DIAGNOSIS — K921 Melena: Secondary | ICD-10-CM | POA: Insufficient documentation

## 2015-05-11 HISTORY — PX: ESOPHAGOGASTRODUODENOSCOPY: SHX5428

## 2015-05-11 LAB — CBC
HEMATOCRIT: 27.6 % — AB (ref 39.0–52.0)
HEMOGLOBIN: 8.5 g/dL — AB (ref 13.0–17.0)
MCH: 24.8 pg — AB (ref 26.0–34.0)
MCHC: 30.8 g/dL (ref 30.0–36.0)
MCV: 80.5 fL (ref 78.0–100.0)
Platelets: 222 10*3/uL (ref 150–400)
RBC: 3.43 MIL/uL — ABNORMAL LOW (ref 4.22–5.81)
RDW: 15.4 % (ref 11.5–15.5)
WBC: 5.7 10*3/uL (ref 4.0–10.5)

## 2015-05-11 LAB — FERRITIN: Ferritin: 6 ng/mL — ABNORMAL LOW (ref 24–336)

## 2015-05-11 SURGERY — EGD (ESOPHAGOGASTRODUODENOSCOPY)
Anesthesia: Moderate Sedation

## 2015-05-11 MED ORDER — STERILE WATER FOR IRRIGATION IR SOLN
Status: DC | PRN
  Administered 2015-05-11: 15:00:00

## 2015-05-11 MED ORDER — MEPERIDINE HCL 100 MG/ML IJ SOLN
INTRAMUSCULAR | Status: DC | PRN
  Administered 2015-05-11: 50 mg via INTRAVENOUS
  Administered 2015-05-11: 25 mg via INTRAVENOUS

## 2015-05-11 MED ORDER — MIDAZOLAM HCL 5 MG/5ML IJ SOLN
INTRAMUSCULAR | Status: DC | PRN
  Administered 2015-05-11 (×2): 2 mg via INTRAVENOUS

## 2015-05-11 MED ORDER — LIDOCAINE VISCOUS 2 % MT SOLN
OROMUCOSAL | Status: AC
  Filled 2015-05-11: qty 15

## 2015-05-11 MED ORDER — MIDAZOLAM HCL 5 MG/5ML IJ SOLN
INTRAMUSCULAR | Status: AC
  Filled 2015-05-11: qty 10

## 2015-05-11 MED ORDER — MEPERIDINE HCL 100 MG/ML IJ SOLN
INTRAMUSCULAR | Status: AC
  Filled 2015-05-11: qty 2

## 2015-05-11 MED ORDER — SODIUM CHLORIDE 0.9 % IV SOLN
INTRAVENOUS | Status: DC
Start: 2015-05-11 — End: 2015-05-11
  Administered 2015-05-11: 1000 mL via INTRAVENOUS

## 2015-05-11 MED ORDER — ATROPINE SULFATE 1 MG/ML IJ SOLN
INTRAMUSCULAR | Status: AC
  Filled 2015-05-11: qty 1

## 2015-05-11 MED ORDER — LIDOCAINE VISCOUS 2 % MT SOLN
OROMUCOSAL | Status: DC | PRN
  Administered 2015-05-11: 4 mL via OROMUCOSAL

## 2015-05-11 MED ORDER — ATROPINE SULFATE 1 MG/ML IJ SOLN
INTRAMUSCULAR | Status: DC | PRN
  Administered 2015-05-11: .5 mg via INTRAVENOUS

## 2015-05-11 NOTE — H&P (Signed)
Primary Care Physician:  Alonza Bogus, MD Primary Gastroenterologist:  Dr. Oneida Alar  Pre-Procedure History & Physical: HPI:  Bernard Ramirez is a 81 y.o. male here for MELENA/ABNORMAL UGI AT ANASTOMOSIS.  Past Medical History  Diagnosis Date  . Chest pain     2004; with dyspnea, stress nuclear-normal EF + questionable inferior wall ischemia, normal coronary angiography;  2010-negative stress nuclear  . Vertigo     ED evaluation-06/2012  . Upper GI bleed 1985    1985-peptic ulcer disease; initial hemigastrectomy; subsequent Billroth II  . Anemia     04/2012: H&H-10.2/31.5, MCV-77; 06/2012:12/38.9  . Diarrhea     h/o Hemoccult-positive stool  . Low back pain   . Hypertension   . Benign prostatic hypertrophy     s/p transurethral resection of the prostate  . Atrial fibrillation 2013    Asymptomatic; diagnosed in 05/2012  . CVA (cerebral infarction)   . BPH (benign prostatic hypertrophy)     Past Surgical History  Procedure Laterality Date  . Rotator cuff repair  1991    Bilateral  . Cholecystectomy    . Vagotomy and pyloroplasty  1970s    Details of procedure uncertain; [id number]  . Transurethral resection of prostate    . Inguinal hernia repair      Left  . Billroth ii    . Colonoscopy  Approximately 2000  . Colonoscopy with esophagogastroduodenoscopy (egd)  06-17-2009    [id number] tubular adenomas and 2 tubulovillous adenomas, incomplete, 3 clips placed  . Esophagogastroduodenoscopy  06/17/2009    SLF: normal/chronic gastritis (non-h pylori)  . Transurethral resection of prostate N/A 12/27/2012    Procedure: TRANSURETHRAL RESECTION OF THE PROSTATE (TURP);  Surgeon: Marissa Nestle, MD;  Location: [facility];  Service: Urology;  Laterality: N/A;  . Colonoscopy  10/2012    Colonoscopy March 2014 at [facility], difficulty exam requiring fluoroscopy and overtube. Patient developed severe bradycardia again during his procedure like he did [facility]. Multiple polyps removed  which were tubular adenomas. Previously tattooed sites at 20 and 30 cm were free of any polypoid change. Left-sided diverticulosis noted.    Prior to Admission medications   Medication Sig Start Date End Date Taking? Authorizing Provider  furosemide (LASIX) 40 MG tablet Take 40 mg by mouth daily as needed (for swelling ln legs).    Yes Historical Provider, MD  gabapentin (NEURONTIN) 300 MG capsule Take 300 mg by mouth at bedtime.    Yes Historical Provider, MD  HYDROcodone-acetaminophen (NORCO) 7.5-325 MG per tablet Take 1 tablet by mouth every 6 (six) hours as needed for moderate pain.   Yes Historical Provider, MD  lisinopril (PRINIVIL,ZESTRIL) 20 MG tablet Take 20 mg by mouth daily.   Yes Historical Provider, MD  pantoprazole (PROTONIX) 40 MG tablet Take 40 mg by mouth daily.   Yes Historical Provider, MD    Allergies as of 05/11/2015 - Review Complete 05/11/2015  Allergen Reaction Noted  . Contrast media [iodinated diagnostic agents]  12/05/2011  . Aspirin Other (See Comments)     Family History  Problem Relation Age of Onset  . Diabetes Mellitus II Mother   . Lung disease Father     Social History   Social History  . Marital Status: Married    Spouse Name: N/A  . Number of Children: 5  . Years of Education: N/A   Occupational History  . Retired [employer]   .     Social History Main Topics  . Smoking status: Former  Smoker -- 40 years    Types: Cigars    Quit date: 08/21/1988  . Smokeless tobacco: Not on file     Comment: Quit many years ago; few cigars per day; didn't inhale  . Alcohol Use: 0.5 oz/week    1 drink(s) per week     Comment: rare  . Drug Use: No  . Sexual Activity: Not on file   Other Topics Concern  . Not on file   Social History Narrative   Lives w/ wife    Review of Systems: See HPI, otherwise negative ROS   Physical Exam: BP 132/79 mmHg  Pulse 75  Temp(Src) 98.8 F (37.1 C) (Oral)  Resp 18  Ht 6' (1.829 m)  Wt 261 lb  (118.389 kg)  BMI 35.39 kg/m2  SpO2 99% General:   Alert,  pleasant and cooperative in NAD Head:  Normocephalic and atraumatic. Neck:  Supple; Lungs:  Clear throughout to auscultation.    Heart:  Regular rate and rhythm. Abdomen:  Soft, nontender and nondistended. Normal bowel sounds, without guarding, and without rebound.   Neurologic:  Alert and  oriented x4;  grossly normal neurologically.  Impression/Plan:     MELENA/ABNORMAL UGI AT ANASTOMOSIS  PLAN: 1. EGD TODAY

## 2015-05-11 NOTE — Progress Notes (Signed)
Primary Care Physician:  Alonza Bogus, MD  Primary Gastroenterologist:  Barney Drain, MD   Chief Complaint  Patient presents with  . Rectal Bleeding    HPI:  Bernard Ramirez is a 81 y.o. male here for further evaluation of GI bleeding. Patient reports that this began about 10 days ago. He was seen by Dr. Luan Pulling on 05/03/2015. Reports black stool mixed with blood and blood clots. Digital rectal exam by Dr. Luan Pulling showed dark red blood, questionable internal hemorrhoid. Hemoglobin 11.3 at that time. Complains of weakness but no chest pain or SOB.   Upper GI series on 05/06/2015 showed Billroth II partial gastrectomy with thickening of the rugal folds at the distal stomach associated with thickening and narrowing at the gastrojejunostomy anastomosis question due to anastomotic ulcer or gastritis. Mass not entirely excluded.  Patient reports at least 20 episodes of bleeding. Brought a picture in today which showed dark red blood and dark stool. Patient also notes bright red blood dripping from his rectum at times. Last episode of bleeding yesterday. Denies abdominal pain. No heartburn, dysphagia. Complains of feeling weak and just not feeling well which began right before the bleeding.  History of A. fib diagnosed in 2013, patient denies being on blood thinner at this time.  Denies NSAIDs or aspirin. Just recently started on pantoprazole last week. Denies PPI within the months preceding.    Colonoscopy March 2014 at [facility], difficulty exam requiring fluoroscopy and overtube. Patient developed severe bradycardia again during his procedure like he did [facility]. Multiple polyps removed which were tubular adenomas. Previously tattooed sites at 20 and 30 cm were free of any polypoid change. Left-sided diverticulosis noted.   Current Outpatient Prescriptions  Medication Sig Dispense Refill  . furosemide (LASIX) 40 MG tablet Take 40 mg by mouth daily as needed (for swelling ln legs).      . gabapentin (NEURONTIN) 300 MG capsule Take 300 mg by mouth at bedtime.     Marland Kitchen HYDROcodone-acetaminophen (NORCO) 7.5-325 MG per tablet Take 1 tablet by mouth every 6 (six) hours as needed for moderate pain.    Marland Kitchen lisinopril (PRINIVIL,ZESTRIL) 20 MG tablet Take 20 mg by mouth daily.    . pantoprazole (PROTONIX) 40 MG tablet Take 40 mg by mouth daily.     No current facility-administered medications for this visit.    Allergies as of 05/11/2015 - Review Complete 05/11/2015  Allergen Reaction Noted  . Contrast media [iodinated diagnostic agents]  12/05/2011  . Aspirin Other (See Comments)     Past Medical History  Diagnosis Date  . Chest pain     2004; with dyspnea, stress nuclear-normal EF + questionable inferior wall ischemia, normal coronary angiography;  2010-negative stress nuclear  . Vertigo     ED evaluation-06/2012  . Upper GI bleed 1985    1985-peptic ulcer disease; initial hemigastrectomy; subsequent Billroth II  . Anemia     04/2012: H&H-10.2/31.5, MCV-77; 06/2012:12/38.9  . Diarrhea     h/o Hemoccult-positive stool  . Low back pain   . Hypertension   . Benign prostatic hypertrophy     s/p transurethral resection of the prostate  . Atrial fibrillation 2013    Asymptomatic; diagnosed in 05/2012  . CVA (cerebral infarction)   . BPH (benign prostatic hypertrophy)     Past Surgical History  Procedure Laterality Date  . Rotator cuff repair  1991    Bilateral  . Cholecystectomy    . Vagotomy and pyloroplasty  1970s  Details of procedure uncertain; [id number]  . Transurethral resection of prostate    . Inguinal hernia repair      Left  . Billroth ii    . Colonoscopy  Approximately 2000  . Colonoscopy with esophagogastroduodenoscopy (egd)  06-17-2009    [id number] tubular adenomas and 2 tubulovillous adenomas, incomplete, 3 clips placed  . Esophagogastroduodenoscopy  06/17/2009    SLF: normal/chronic gastritis (non-h pylori)  . Transurethral resection of prostate  N/A 12/27/2012    Procedure: TRANSURETHRAL RESECTION OF THE PROSTATE (TURP);  Surgeon: Marissa Nestle, MD;  Location: [facility];  Service: Urology;  Laterality: N/A;  . Colonoscopy  10/2012    Colonoscopy March 2014 at [facility], difficulty exam requiring fluoroscopy and overtube. Patient developed severe bradycardia again during his procedure like he did [facility]. Multiple polyps removed which were tubular adenomas. Previously tattooed sites at 20 and 30 cm were free of any polypoid change. Left-sided diverticulosis noted.    Family History  Problem Relation Age of Onset  . Diabetes Mellitus II Mother   . Lung disease Father     Social History   Social History  . Marital Status: Married    Spouse Name: N/A  . Number of Children: 5  . Years of Education: N/A   Occupational History  . Retired [employer]   .     Social History Main Topics  . Smoking status: Former Smoker -- 40 years    Types: Cigars    Quit date: 08/21/1988  . Smokeless tobacco: Not on file     Comment: Quit many years ago; few cigars per day; didn't inhale  . Alcohol Use: 0.5 oz/week    1 drink(s) per week     Comment: rare  . Drug Use: No  . Sexual Activity: Not on file   Other Topics Concern  . Not on file   Social History Narrative   Lives w/ wife      ROS:  General: Negative for anorexia, weight loss, fever, chills, fatigue. + weakness. Eyes: Negative for vision changes.  ENT: Negative for hoarseness, difficulty swallowing , nasal congestion. CV: Negative for chest pain, angina, palpitations, dyspnea on exertion, peripheral edema.  Respiratory: Negative for dyspnea at rest, dyspnea on exertion, cough, sputum, wheezing.  GI: See history of present illness. GU:  Negative for dysuria, hematuria, urinary incontinence, urinary frequency, nocturnal urination.  MS: Negative for joint pain, low back pain.  Derm: Negative for rash or itching.  Neuro: Negative for weakness, abnormal sensation,  seizure, frequent headaches, memory loss, confusion.  Psych: Negative for anxiety, depression, suicidal ideation, hallucinations.  Endo: Negative for unusual weight change.  Heme: Negative for bruising or bleeding. Allergy: Negative for rash or hives.    Physical Examination:  BP 135/80 mmHg  Pulse 75  Temp(Src) 97.9 F (36.6 C) (Oral)  Ht 6' (1.829 m)  Wt 261 lb 12.8 oz (118.752 kg)  BMI 35.50 kg/m2   General: Well-nourished, well-developed in no acute distress. Accompanied by wife.  Head: Normocephalic, atraumatic.   Eyes: Conjunctiva pink, no icterus. Mouth: Oropharyngeal mucosa moist and pink , no lesions erythema or exudate. Neck: Supple without thyromegaly, masses, or lymphadenopathy.  Lungs: Clear to auscultation bilaterally.  Heart: Regular rate and rhythm, no murmurs rubs or gallops.  Abdomen: Bowel sounds are normal, nontender, nondistended, no hepatosplenomegaly or masses, no abdominal bruits or    hernia , no rebound or guarding.   Rectal: done by PCP see HPI. Extremities: No lower extremity  edema. No clubbing or deformities.  Neuro: Alert and oriented x 4 , grossly normal neurologically.  Skin: Warm and dry, no rash or jaundice.   Psych: Alert and cooperative, normal mood and affect.  Labs: Lab Results  Component Value Date   WBC 8.3 05/03/2015   HGB 11.3* 05/03/2015   HCT 36.2* 05/03/2015   MCV 80.3 05/03/2015   PLT 242 05/03/2015   Lab Results  Component Value Date   INR 1.19 05/03/2015   INR 1.08 12/22/2012     Imaging Studies: Dg Ugi  W/kub  05/06/2015   CLINICAL DATA:  Peptic ulcer disease, prior gastric surgery, recent episodes of bright red blood per rectum and black stools  EXAM: UPPER GI SERIES WITH KUB  TECHNIQUE: After obtaining a scout radiograph a routine upper GI series was performed using thin and high density barium.  FLUOROSCOPY TIME:  Radiation Exposure Index (as provided by the fluoroscopic device): Not provided  If the device does not  provide the exposure index:  Fluoroscopy Time (in minutes and seconds):  3 minutes 18 seconds  Number of Acquired Images: Single overhead image, multiple screen captures during fluoroscopy  COMPARISON:  CT abdomen and pelvis 12/05/2011  FINDINGS: Normal bowel gas pattern on scout image.  Normal esophageal distention without mass or stricture.  Diffuse esophageal dysmotility noted.  No persistent intraluminal filling defects.  Status post Billroth II partial gastrectomy.  Residual small gastric lumen distends without gross evidence of mass or hiatal hernia.  Gastrojejunostomy anastomosis patent with bowel wall thickening at the anastomosis and thickening of adjacent rugal folds which could represent anastomotic edema/inflammation or anastomotic ulcer.  No discrete contrast extravasation identified or extraluminal collection to confirm ulcer.  Reflux of contrast into afferent loop with dilatation of the proximal jejunal loop at the anastomosis.  Mucosal folds of the afferent and efferent loops appear normal.  No downstream jejunal dilatation or wall thickening.  Bones demineralized.  IMPRESSION: Post Billroth II partial gastrectomy with thickening of rugal folds at the distal stomach associated with thickening and narrowing at the gastrojejunostomy anastomosis question due to anastomotic ulcer or gastritis ; mass not entirely excluded however and endoscopic correlation recommended.  No evidence of obstruction.  Findings called to Dr. Luan Pulling on 05/06/2015 at 1150 hours.   Electronically Signed   By: Lavonia Dana M.D.   On: 05/06/2015 11:52

## 2015-05-11 NOTE — Patient Instructions (Signed)
1. Upper endoscopy today. 2. Continue pantoprazole.

## 2015-05-11 NOTE — Progress Notes (Signed)
REVIEWED. EGD TODAY.

## 2015-05-11 NOTE — Progress Notes (Signed)
CC'ED TO PCP 

## 2015-05-11 NOTE — Assessment & Plan Note (Signed)
81 year old gentleman with history of remote Billroth II partial gastrectomy for history of peptic ulcer disease who presents for urgent office visit due to reported melena and rectal bleeding. Patient's last EGD was in 2010 as outlined under past surgical history. Recent upper GI series with questionable anastomotic ulcer, mass cannot be excluded with some narrowing at the gastrojejunostomy anastomosis. Recent hemoglobin 11.3 at onset of symptoms.  Discussed with Dr. Oneida Alar. Plan for EGD this afternoon. Patient reports last solid food at 6 PM last night. Half a cup of coffee with his medication for breakfast.  I have discussed the risks, alternatives, benefits with regards to but not limited to the risk of reaction to medication, bleeding, infection, perforation and the patient is agreeable to proceed. Written consent to be obtained.  Obtain CBC at time of endoscopy.   Continue pantoprazole 40 mg daily which was recently started.

## 2015-05-11 NOTE — Discharge Instructions (Addendum)
You have mild gastritis. YOUR BLOOD COUNT DROPPED DUE TO A FRIABLE ANASTOMOSIS WHICH MEANS IT BLEEDS EASILY. I biopsied your stomach AND ANASTOMOSIS. I CHECKED YOUR IRON STORES.   AVOID TRIGGERS FOR GASTRITIS. SEE INFO BELOW.  FOLLOW A LOW FAT DIET. SEE INFO BELOW.  DR. Deshane Cotroneo WILL ARRANGE FOR IV IRON THIS WEEK.  YOUR BIOPSY RESULTS WILL BE AVAILABLE IN MY CHART AFTER SEP 22 AND MY OFFICE WILL CONTACT YOU IN 10-14 DAYS WITH YOUR RESULTS.   YOU MAY NEED A COLONOSCOPY.  FOLLOW UP IN 4 MOS.   UPPER ENDOSCOPY AFTER CARE Read the instructions outlined below and refer to this sheet in the next week. These discharge instructions provide you with general information on caring for yourself after you leave the hospital. While your treatment has been planned according to the most current medical practices available, unavoidable complications occasionally occur. If you have any problems or questions after discharge, call DR. Coron Rossano, [phone].  ACTIVITY  You may resume your regular activity, but move at a slower pace for the next 24 hours.   Take frequent rest periods for the next 24 hours.   Walking will help get rid of the air and reduce the bloated feeling in your belly (abdomen).   No driving for 24 hours (because of the medicine (anesthesia) used during the test).   You may shower.   Do not sign any important legal documents or operate any machinery for 24 hours (because of the anesthesia used during the test).    NUTRITION  Drink plenty of fluids.   You may resume your normal diet as instructed by your doctor.   Begin with a light meal and progress to your normal diet. Heavy or fried foods are harder to digest and may make you feel sick to your stomach (nauseated).   Avoid alcoholic beverages for 24 hours or as instructed.    MEDICATIONS  You may resume your normal medications.   WHAT YOU CAN EXPECT TODAY  Some feelings of bloating in the abdomen.   Passage of more  gas than usual.    IF YOU HAD A BIOPSY TAKEN DURING THE UPPER ENDOSCOPY:  Eat a soft diet IF YOU HAVE NAUSEA, BLOATING, ABDOMINAL PAIN, OR VOMITING.    FINDING OUT THE RESULTS OF YOUR TEST Not all test results are available during your visit. DR. Oneida Alar WILL CALL YOU WITHIN 14 DAYS OF YOUR PROCEDUE WITH YOUR RESULTS. Do not assume everything is normal if you have not heard from DR. Heyward Douthit, CALL HER OFFICE AT [phone].  SEEK IMMEDIATE MEDICAL ATTENTION AND CALL THE OFFICE: [phone] IF:  You have more than a spotting of blood in your stool.   Your belly is swollen (abdominal distention).   You are nauseated or vomiting.   You have a temperature over 101F.   You have abdominal pain or discomfort that is severe or gets worse throughout the day.   Gastritis  Gastritis is an inflammation (the body's way of reacting to injury and/or infection) of the stomach. It is often caused by viral or bacterial (germ) infections. It can also be caused BY ASPIRIN, BC/GOODY POWDER'S, (IBUPROFEN) MOTRIN, OR ALEVE (NAPROXEN), chemicals (including alcohol), SPICY FOODS, and medications. This illness may be associated with generalized malaise (feeling tired, not well), UPPER ABDOMINAL STOMACH cramps, and fever. One common bacterial cause of gastritis is an organism known as H. Pylori. This can be treated with antibiotics.    Low-Fat Diet  BREADS, CEREALS, PASTA, RICE, DRIED PEAS, AND  BEANS These products are high in carbohydrates and most are low in fat. Therefore, they can be increased in the diet as substitutes for fatty foods. They too, however, contain calories and should not be eaten in excess. Cereals can be eaten for snacks as well as for breakfast.  Include foods that contain fiber (fruits, vegetables, whole grains, and legumes). Research shows that fiber may lower blood cholesterol levels, especially the water-soluble fiber found in fruits, vegetables, oat products, and legumes.  FRUITS  AND VEGETABLES It is good to eat fruits and vegetables. Besides being sources of fiber, both are rich in vitamins and some minerals. They help you get the daily allowances of these nutrients. Fruits and vegetables can be used for snacks and desserts.  MEATS Limit lean meat, chicken, [place], and fish to no more than 6 ounces per day.  Beef, Pork, and Lamb Use lean cuts of beef, pork, and lamb. Lean cuts include:  Extra-lean ground beef.  Arm roast.  Sirloin tip.  Center-cut ham.  Round steak.  Loin chops.  Rump roast.  Tenderloin.  Trim all fat off the outside of meats before cooking. It is not necessary to severely decrease the intake of red meat, but lean choices should be made. Lean meat is rich in protein and contains a highly absorbable form of iron. Premenopausal women, in particular, should avoid reducing lean red meat because this could increase the risk for low red blood cells (iron-deficiency anemia).  Chicken and [place] These are good sources of protein. The fat of poultry can be reduced by removing the skin and underlying fat layers before cooking. Chicken and [place] can be substituted for lean red meat in the diet. Poultry should not be fried or covered with high-fat sauces.  Fish and Shellfish Fish is a good source of protein. Shellfish contain cholesterol, but they usually are low in saturated fatty acids. The preparation of fish is important. Like chicken and [place], they should not be fried or covered with high-fat sauces.  EGGS Egg whites contain no fat or cholesterol. They can be eaten often. Try 1 to 2 egg whites instead of whole eggs in recipes or use egg substitutes that do not contain yolk.  MILK AND DAIRY PRODUCTS Use skim or 1% milk instead of 2% or whole milk. Decrease whole milk, natural, and processed cheeses. Use nonfat or low-fat (2%) cottage cheese or low-fat cheeses made from vegetable oils. Choose nonfat or low-fat (1 to 2%) yogurt. Experiment with  evaporated skim milk in recipes that call for heavy cream. Substitute low-fat yogurt or low-fat cottage cheese for sour cream in dips and salad dressings. Have at least 2 servings of low-fat dairy products, such as 2 glasses of skim (or 1%) milk each day to help get your daily calcium intake.  FATS AND OILS Reduce the total intake of fats, especially saturated fat. Butterfat, lard, and beef fats are high in saturated fat and cholesterol. These should be avoided as much as possible. Vegetable fats do not contain cholesterol, but certain vegetable fats, such as coconut oil, palm oil, and palm kernel oil are very high in saturated fats. These should be limited. These fats are often used in bakery goods, processed foods, popcorn, oils, and nondairy creamers. Vegetable shortenings and some peanut butters contain hydrogenated oils, which are also saturated fats. Read the labels on these foods and check for saturated vegetable oils.  Unsaturated vegetable oils and fats do not raise blood cholesterol. However, they should be limited because  they are fats and are high in calories. Total fat should still be limited to 30% of your daily caloric intake. Desirable liquid vegetable oils are corn oil, cottonseed oil, olive oil, canola oil, safflower oil, soybean oil, and sunflower oil. Peanut oil is not as good, but small amounts are acceptable. Buy a heart-healthy tub margarine that has no partially hydrogenated oils in the ingredients. Mayonnaise and salad dressings often are made from unsaturated fats, but they should also be limited because of their high calorie and fat content. Seeds, nuts, peanut butter, olives, and avocados are high in fat, but the fat is mainly the unsaturated type. These foods should be limited mainly to avoid excess calories and fat.  OTHER EATING TIPS Snacks  Most sweets should be limited as snacks. They tend to be rich in calories and fats, and their caloric content outweighs their  nutritional value. Some good choices in snacks are graham crackers, melba toast, soda crackers, bagels (no egg), English muffins, fruits, and vegetables. These snacks are preferable to snack crackers, [place] fries, and chips. Popcorn should be air-popped or cooked in small amounts of liquid vegetable oil.  Desserts Eat fruit, low-fat yogurt, and fruit ices instead of pastries, cake, and cookies. Sherbet, angel food cake, gelatin dessert, frozen low-fat yogurt, or other frozen products that do not contain saturated fat (pure fruit juice bars, frozen ice pops) are also acceptable.   COOKING METHODS Choose those methods that use little or no fat. They include: Poaching.  Braising.  Steaming.  Grilling.  Baking.  Stir-frying.  Broiling.  Microwaving.  Foods can be cooked in a nonstick pan without added fat, or use a nonfat cooking spray in regular cookware. Limit fried foods and avoid frying in saturated fat. Add moisture to lean meats by using water, broth, cooking wines, and other nonfat or low-fat sauces along with the cooking methods mentioned above. Soups and stews should be chilled after cooking. The fat that forms on top after a few hours in the refrigerator should be skimmed off. When preparing meals, avoid using excess salt. Salt can contribute to raising blood pressure in some people.  EATING AWAY FROM HOME Order entres, potatoes, and vegetables without sauces or butter. When meat exceeds the size of a deck of cards (3 to 4 ounces), the rest can be taken home for another meal. Choose vegetable or fruit salads and ask for low-calorie salad dressings to be served on the side. Use dressings sparingly. Limit high-fat toppings, such as bacon, crumbled eggs, cheese, sunflower seeds, and olives. Ask for heart-healthy tub margarine instead of butter.

## 2015-05-12 NOTE — Op Note (Signed)
[facility] [address] [place], [address]   ENDOSCOPY PROCEDURE REPORT  PATIENT: Bernard Ramirez, Bernard Ramirez  MR#: [id number] BIRTHDATE: Apr 30, 1931 , 58  yrs. old GENDER: male  ENDOSCOPIST: Danie Binder, MD REFERRED [id number] Hawkins, M.D.  PROCEDURE DATE: 05/11/2015 PROCEDURE:   EGD w/ biopsy  INDICATIONS:MELENA-DROP IN Hb FROM 11.3-8.5. MEDICATIONS: Demerol 75 mg IV and Versed 4 mg IV TOPICAL ANESTHETIC:   Viscous Xylocaine ASA CLASS:  DESCRIPTION OF PROCEDURE:     Physical exam was performed.  Informed consent was obtained from the patient after explaining the benefits, risks, and alternatives to the procedure.  The patient was connected to the monitor and placed in the left lateral position.  Continuous oxygen was provided by nasal cannula and IV medicine administered through an indwelling cannula.  After administration of sedation, the patients esophagus was intubated and the [id number]  endoscope was advanced under direct visualization to the second portion of the duodenum.  The scope was removed slowly by carefully examining the color, texture, anatomy, and integrity of the mucosa on the way out.  The patient was recovered in endoscopy and discharged home in satisfactory condition.  Estimated blood loss is zero unless otherwise noted in this procedure report.    ESOPHAGUS: The mucosa of the esophagus appeared normal.   STOMACH: Moderate erosive gastritis (inflammation) was found in the gastric remnant.  Multiple biopsies were performed using cold forceps. ANASTOMOSIS FRIABLE AND BLED EASILY WHEN THE SCOPE PASSED.  COLD FORCEPS BIOPSIES OBTAINED.  NORMAL AFFERENT AND EFFERENT LOOPS.  COMPLICATIONS: There were no immediate complications.  ENDOSCOPIC IMPRESSION: 1.   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   MODERATE Erosive  gastritis  RECOMMENDATIONS: FOLLOW A LOW FAT DIET. ARRANGE FOR IV IRON THIS WEEK. AWAIT BIOPSY RESULTS. CONSIDER A COLONOSCOPY IF NO SOURCE FOR ANEMIA IDENTIFIED. SMALL BOWEL WOULD BE HARD TO EVALUATE DUE TO [place] II ANATOMY. FOLLOW UP IN 4 MOS.  REPEAT EXAM: ______________________________ eSignedDanie Binder, MD 2015-06-10 2:22 PM  CPT CODES: ICD CODES:  The ICD and CPT codes recommended by this software are interpretations from the data that the clinical staff has captured with the software.  The verification of the translation of this report to the ICD and CPT codes and modifiers is the sole responsibility of the health care institution and practicing physician where this report was generated.  [employer] will not be held responsible for the validity of the ICD and CPT codes included on this report.  AMA assumes no liability for data contained or not contained herein. CPT is a Designer, television/film set of the [employer].

## 2015-05-13 ENCOUNTER — Telehealth: Payer: Self-pay | Admitting: Gastroenterology

## 2015-05-13 DIAGNOSIS — K921 Melena: Secondary | ICD-10-CM

## 2015-05-13 MED ORDER — FERUMOXYTOL INJECTION 510 MG/17 ML: 510.0000 mg | Freq: Once | INTRAVENOUS | Status: DC

## 2015-05-13 NOTE — Telephone Encounter (Signed)
Pt is set up for 9:00 am in the morning. Orders have been faxed to short stay. He is aware

## 2015-05-13 NOTE — Telephone Encounter (Signed)
PLEASE CALL PT.  HIS IRON STORES ARE LOW. HE NEED FERAHEME 510 MG IV THIS WEEK.

## 2015-05-14 ENCOUNTER — Encounter (HOSPITAL_COMMUNITY)
Admission: RE | Admit: 2015-05-14 | Discharge: 2015-05-14 | Disposition: A | Discharge status: Home / Self Care | Payer: PPO | Source: Ambulatory Visit | Attending: Gastroenterology | Admitting: Gastroenterology

## 2015-05-14 ENCOUNTER — Encounter (HOSPITAL_COMMUNITY): Admission: RE | Admit: 2015-05-14 | Payer: PPO | Source: Ambulatory Visit

## 2015-05-14 DIAGNOSIS — D649 Anemia, unspecified: Secondary | ICD-10-CM | POA: Diagnosis present

## 2015-05-14 MED ORDER — SODIUM CHLORIDE 0.9 % IV SOLN
Freq: Once | INTRAVENOUS | Status: AC
  Administered 2015-05-14: 250 mL via INTRAVENOUS

## 2015-05-14 MED ORDER — SODIUM CHLORIDE 0.9 % IV SOLN
510.0000 mg | Freq: Once | INTRAVENOUS | Status: AC
  Administered 2015-05-14: 510 mg via INTRAVENOUS
  Filled 2015-05-14: qty 17

## 2015-05-17 ENCOUNTER — Encounter (HOSPITAL_COMMUNITY): Payer: Self-pay | Admitting: Gastroenterology

## 2015-05-19 NOTE — Telephone Encounter (Addendum)
Please call pt. His Bx shows gastritis AND INFLAMMATION/INJURY ASSOCIATED WITH HAVING SURGERY. HIS IRON STORES ARE VERY LOW. HE SHOULD HAVE A REPEAT COLONOSCOPY AT [facility] WITH OVERTUBE/FLUROSCOPY WITHIN THE NEXT MONTH DUE TO MELENA/IRON DEFICIENCY ANEMIA/PMHx-ADVANCED AND SIMPLE ADENOMAS.   FOLLOW A LOW FAT DIET.   AVOID TRIGGERS FOR GASTRITIS.   REPEAT CBC/FERRITIN IN ONE MONTH.  FOLLOW UP IN 4 MOS E30 MELENA/IRON DEFICIENCY ANEMIA/PMHx-ADVANCED AND SIMPLE ADENOMAS.

## 2015-05-20 ENCOUNTER — Other Ambulatory Visit: Payer: Self-pay

## 2015-05-20 DIAGNOSIS — D509 Iron deficiency anemia, unspecified: Secondary | ICD-10-CM

## 2015-05-20 NOTE — Telephone Encounter (Signed)
LMOM that referral would be made.

## 2015-05-20 NOTE — Telephone Encounter (Signed)
APPT MADE

## 2015-05-20 NOTE — Telephone Encounter (Signed)
Pt wife called and states that patients rectal bleeding has began again and pt is worried. Please advise. Pt wife can be called on cell at  [phone].

## 2015-05-20 NOTE — Telephone Encounter (Signed)
Also, in the message told them to go to the nearest ED if having rectal bleeding.

## 2015-05-20 NOTE — Telephone Encounter (Signed)
PLEASE CALL PT. WE WILL REFER HIM TO [facility].

## 2015-05-20 NOTE — Telephone Encounter (Signed)
PT's wife, Kathi Der, returned call and was informed of the results. She said he only had the bleeding once, the water in comode was bright red but his stool was dark. He does not want to go to the ED now, unless he has more bleeding. She said that pt is refusing to go to [facility], he said that he has had problems every since he went and especially with his legs. They want to know if he can be referred to another facility.  Please advise!

## 2015-05-20 NOTE — Telephone Encounter (Signed)
Referral sent to [facility]

## 2015-05-20 NOTE — Telephone Encounter (Signed)
LMOM to call. ( Lab orders done for 06/18/2015, will mail to pt).

## 2015-05-20 NOTE — Telephone Encounter (Signed)
PLEASE CALL PT. IF HE IS HAVING RECTAL BLEEDING HE NEED TO GO TO NEAREST ED.

## 2015-05-21 ENCOUNTER — Other Ambulatory Visit: Payer: Self-pay

## 2015-05-21 DIAGNOSIS — D509 Iron deficiency anemia, unspecified: Secondary | ICD-10-CM

## 2015-05-21 DIAGNOSIS — Z86010 Personal history of colonic polyps: Secondary | ICD-10-CM

## 2015-05-21 DIAGNOSIS — K921 Melena: Secondary | ICD-10-CM

## 2015-05-21 LAB — CBC WITH DIFFERENTIAL/PLATELET
BASOS ABS: 0 10*3/uL (ref 0.0–0.1)
BASOS PCT: 0 % (ref 0–1)
EOS ABS: 0.2 10*3/uL (ref 0.0–0.7)
EOS PCT: 3 % (ref 0–5)
HEMATOCRIT: 28.6 % — AB (ref 39.0–52.0)
Hemoglobin: 8.9 g/dL — ABNORMAL LOW (ref 13.0–17.0)
LYMPHS PCT: 27 % (ref 12–46)
Lymphs Abs: 1.6 10*3/uL (ref 0.7–4.0)
MCH: 24.9 pg — ABNORMAL LOW (ref 26.0–34.0)
MCHC: 31.1 g/dL (ref 30.0–36.0)
MCV: 80.1 fL (ref 78.0–100.0)
MPV: 9.6 fL (ref 8.6–12.4)
Monocytes Absolute: 0.5 10*3/uL (ref 0.1–1.0)
Monocytes Relative: 9 % (ref 3–12)
Neutro Abs: 3.5 10*3/uL (ref 1.7–7.7)
Neutrophils Relative %: 61 % (ref 43–77)
PLATELETS: 199 10*3/uL (ref 150–400)
RBC: 3.57 MIL/uL — AB (ref 4.22–5.81)
RDW: 18.3 % — AB (ref 11.5–15.5)
WBC: 5.8 10*3/uL (ref 4.0–10.5)

## 2015-05-21 NOTE — Telephone Encounter (Signed)
Pt and his wife came by the office. He said he does not want to go to [facility]. Asked shy Dr. Oneida Alar could not do colonoscopy if needed. I explained that he had colonoscopy at [facility] previously and she thought it was best for him to go back there for the procedure. He said he had it done in 2014 and they said he would not need another one for 5 years. I explained, that was if he did not have problems and now HE IS having problems. So he agreed to go back to [facility]. He would like for Dr. Oneida Alar to see if she can get him in [place] to have colonoscopy sooner than a month. He said he is so weak and he is not able to do anything at present. He wants to know if he could have a blood transfusion. His hemoglobin was 8.5 on 05/11/2015. He is aware that Dr. Oneida Alar is at the hospital today and I am sending her a message. He has not seen any more blood.  I told them to go to the ED if he starts bleeding.  Please advise! Contact number is home phone [phone].

## 2015-05-21 NOTE — Assessment & Plan Note (Signed)
REPEAT CBC

## 2015-05-21 NOTE — Telephone Encounter (Signed)
Called patient TO DISCUSS CONCERNS. SPOKE TO WIFE. HAD BLACK STOOL LAST NIGHT. BRBPR EARLIER THIS WEEK. WATER IN COMMODE WAS RED. BEEN WEAK FOR A LONG TIME. NOT REALLY HAPPY WITH ED AT [facility]. PT NEEDS CBC STAT.

## 2015-05-21 NOTE — Addendum Note (Signed)
Addended by: Danie Binder on: 05/21/2015 05:30 PM   Modules accepted: Orders

## 2015-05-23 NOTE — Telephone Encounter (Signed)
Called patient TO DISCUSS RESULTS OCT 1. SPOKE TO WIFE. Hb 8.9. EXPLAINED WEAKNESS PARTIALLY DUE TO LOW Hb BUT HE SHOULD BE CHECKED FOR THYROID AND UTI. WILL CALL [place] AND TRY TO MOVE TCS UP TO ASAP.

## 2015-05-24 NOTE — Telephone Encounter (Signed)
Noted! Routing to [place] for the [facility] Referral.

## 2015-05-24 NOTE — Telephone Encounter (Signed)
[facility] and they requested the pt info. Faxed referral and that pt needs TCS ASAP

## 2015-05-27 NOTE — Telephone Encounter (Signed)
Pt has an appointment on 10/17

## 2015-05-31 ENCOUNTER — Ambulatory Visit: Payer: PPO | Admitting: Gastroenterology

## 2015-06-01 ENCOUNTER — Telehealth: Payer: Self-pay

## 2015-06-01 NOTE — Telephone Encounter (Signed)
Pt's wife, Kathi Der, came by. Said pt has appt at [facility] on Monday. They went for pre-op yesterday and was told they need to bring $250.00 the day of procedure. [facility] is out of their network) She said they do not have that.  She said she was told that [facility] is in their net work. I explained that [facility] has means of checking the colon that Dr. Oneida Alar cannot check. She said they were told it was only 2% of colon that Dr. Oneida Alar could not see and that the pt said he is just not going to worry about that. I explained that Dr. Oneida Alar was concerned when he was having the bleeding. She said pt is NOT Bleeding now. She said he is supposed to check his blood next the end of the month, does he need anything sooner. She wants to know how low it has to be to have a blood transfusion. Please advise!

## 2015-06-03 NOTE — Telephone Encounter (Signed)
PLEASE CALL PT. THEY NEED TO FIND OUT WHICH MED CTR: [facility] OR [facility], IS IN NETWORK. THE TCS HE NEEDS CANNOT BE DONE IN [place].

## 2015-06-03 NOTE — Telephone Encounter (Signed)
Pt's wife is aware and said that she will check it out and call us back.

## 2015-06-11 ENCOUNTER — Ambulatory Visit (INDEPENDENT_AMBULATORY_CARE_PROVIDER_SITE_OTHER): Payer: PPO | Admitting: Urology

## 2015-06-11 DIAGNOSIS — N401 Enlarged prostate with lower urinary tract symptoms: Secondary | ICD-10-CM

## 2015-06-11 DIAGNOSIS — R3912 Poor urinary stream: Secondary | ICD-10-CM | POA: Diagnosis not present

## 2015-08-22 DIAGNOSIS — C61 Malignant neoplasm of prostate: Secondary | ICD-10-CM

## 2015-08-22 HISTORY — DX: Malignant neoplasm of prostate: C61

## 2015-09-06 DIAGNOSIS — M545 Low back pain: Secondary | ICD-10-CM | POA: Diagnosis not present

## 2015-09-06 DIAGNOSIS — I482 Chronic atrial fibrillation: Secondary | ICD-10-CM | POA: Diagnosis not present

## 2015-09-06 DIAGNOSIS — I10 Essential (primary) hypertension: Secondary | ICD-10-CM | POA: Diagnosis not present

## 2015-09-06 DIAGNOSIS — R21 Rash and other nonspecific skin eruption: Secondary | ICD-10-CM | POA: Diagnosis not present

## 2015-09-07 DIAGNOSIS — I10 Essential (primary) hypertension: Secondary | ICD-10-CM | POA: Diagnosis not present

## 2015-09-07 DIAGNOSIS — I482 Chronic atrial fibrillation: Secondary | ICD-10-CM | POA: Diagnosis not present

## 2015-09-07 DIAGNOSIS — M545 Low back pain: Secondary | ICD-10-CM | POA: Diagnosis not present

## 2015-09-10 ENCOUNTER — Ambulatory Visit (INDEPENDENT_AMBULATORY_CARE_PROVIDER_SITE_OTHER): Payer: PPO | Admitting: Allergy and Immunology

## 2015-09-10 ENCOUNTER — Encounter: Payer: Self-pay | Admitting: Allergy and Immunology

## 2015-09-10 ENCOUNTER — Encounter: Payer: Self-pay | Admitting: Gastroenterology

## 2015-09-10 ENCOUNTER — Ambulatory Visit: Payer: PPO | Admitting: Gastroenterology

## 2015-09-10 ENCOUNTER — Telehealth: Payer: Self-pay | Admitting: Gastroenterology

## 2015-09-10 VITALS — BP 126/80 | HR 78 | Temp 97.7°F | Resp 16 | Ht 72.0 in | Wt 262.8 lb

## 2015-09-10 DIAGNOSIS — J309 Allergic rhinitis, unspecified: Secondary | ICD-10-CM

## 2015-09-10 DIAGNOSIS — J3089 Other allergic rhinitis: Secondary | ICD-10-CM

## 2015-09-10 DIAGNOSIS — L299 Pruritus, unspecified: Secondary | ICD-10-CM

## 2015-09-10 NOTE — Patient Instructions (Addendum)
Take Home Sheet  Review with Dr. Luan Pulling possible alternative to hydrocodone.  1. Avoidance: Mite and Mold  And of all fragranced soaps/lotions/detergents.   Avoid rubbing alcohol to skin.  2. Antihistamine: Zyrtec 10mg  by mouth once daily for runny nose or itching.   3. Nasal Spray:  Saline 2 spray(s) each nostril once daily for stuffy nose or drainage.    4.  Moisturize skin with Vanicream or Cerave 2-4 times daily.  5.  Additional Skin regime per Dr. Tarri Glenn and Hydroxyzine at bedtime as needed per Dr. Tarri Glenn.  6.  Keep written diary of time of day/exposure/ingestion/activity or other patterns of itching.  7.  Follow up Visit: 1-2 months or sooner if needed.    Consider selected labs at [facility].  Websites that have reliable Patient information: 1. American Academy of Asthma, Allergy, & Immunology: www.aaaai.org 2. Food Allergy Network: www.foodallergy.org 3. Mothers of Asthmatics: www.aanma.org 4. [place]: DiningCalendar.de 5. American College of Allergy, Asthma, & Immunology: https://robertson.info/ or www.acaai.org

## 2015-09-10 NOTE — Telephone Encounter (Signed)
PATIENT WAS A NO SHOW AND LETTER SENT  °

## 2015-09-10 NOTE — Progress Notes (Signed)
NEW PATIENT NOTE  RE: Bernard Ramirez MRN: [id number] DOB: May 14, 1931 [facility] [place] [address] [place] [address] Date of Office Visit: 09/10/2015  Referring provider: Sinda Du, MD [place], [place] [address]  Subjective:  TORSTEN Ramirez is a 82 y.o. male who presents today for Pruritus.  Assessment:   1. Pruritus without noted rash.   2. Perennial allergic rhinitis   3.      Complex medical history--- including chronic back pain, taking hydrocodone. Plan:   Patient Instructions  Review with Dr. Luan Pulling possible alternative to hydrocodone. 1. Avoidance: Mite and Mold  And of all fragranced soaps/lotions/detergents.   Avoid rubbing alcohol to skin. 2. Antihistamine: Zyrtec 10mg  by mouth once daily for runny nose/itching. 3. Nasal Spray:  Saline 2 spray(s) each nostril once daily for stuffy nose or drainage.  4.  Moisturize skin with Vanicream or Cerave 2-4 times daily. 5.  Additional Skin regime per Dr. Tarri Glenn and Hydroxyzine at bedtime as needed per Dr. Tarri Glenn. 6.  Keep written diary of time of day/exposure/ingestion/activity or other patterns of itching. 7.  Follow up Visit: 1-2 months or sooner if needed.            Consider selected labs at [facility] after review of recent labs completed by Primary MD.  HPI: [name] presents with his wife reporting itching and redness.  He initially describes changes at his lower extremities,(after procedure in 2014 or 2015  at [facility] polypectomy) of lower extremity redness and possible swelling.  He began wearing support socks of benefit.  But now for nearly a month,  he describes redness at his arms, occasionally at back, with greater dry areas at the outer lower extremities without hives, swelling, or actual rash.  His wife has noted itching, specifically at his neck and behind the ears though she has not seen any rash.  He was evaluated by Dr. Tarri Glenn,  dermatology and started on topical agents with unclear benefit.  His wife also reports treatment for lower extremity cellulitis mid-November with antibiotics, after which a discussion of a doxycycline related rash had occurred.  He completed sulfa antibiotic 2 weeks ago without any clear correlation to itching.  Recently he is using hydroxyzine of partial benefit.  He moisturizes with Lubriderm or Murlean Hark, uses [employer] and after visit with Dr. Tarri Glenn changed from Safeguard to [employer].  He also rubs his skin down with alcohol and maybe even shower frequently with improvement.  But no clear change or worsening with meals, indoor versus outdoor exposures or any activity.  He may feel worse if body temperature rises.  They report a recent change in his blood pressure medication because of his skin concerns, from lisinopril to losartan.  He also thought management for his chronic back pain seemed less effective in the recent months. Denies ED or Urgent care visits, prednisone or antibiotic courses.  Medical History: Past Medical History  Diagnosis Date  . Chest pain     2004; with dyspnea, stress nuclear-normal EF + questionable inferior wall ischemia, normal coronary angiography;  2010-negative stress nuclear  . Vertigo     ED evaluation-06/2012  . Upper GI bleed 1985    1985-peptic ulcer disease; initial hemigastrectomy; subsequent Billroth II  . Anemia     04/2012: H&H-10.2/31.5, MCV-77; 06/2012:12/38.9  . Diarrhea     h/o Hemoccult-positive stool  . Low back pain   . Hypertension   . Benign  prostatic hypertrophy     s/p transurethral resection of the prostate  . Atrial fibrillation ([place]) 2013    Asymptomatic; diagnosed in 05/2012  . CVA (cerebral infarction)   . BPH (benign prostatic hypertrophy)    Surgical History: Past Surgical History  Procedure Laterality Date  . Rotator cuff repair  1991    Bilateral  . Cholecystectomy    . Vagotomy and pyloroplasty  1970s     Details of procedure uncertain; [id number]  . Transurethral resection of prostate    . Inguinal hernia repair      Left  . Billroth ii    . Colonoscopy  Approximately 2000  . Colonoscopy with esophagogastroduodenoscopy (egd)  06-17-2009    [id number] tubular adenomas and 2 tubulovillous adenomas, incomplete, 3 clips placed  . Esophagogastroduodenoscopy  06/17/2009    SLF: normal/chronic gastritis (non-h pylori)  . Transurethral resection of prostate N/A 12/27/2012    Procedure: TRANSURETHRAL RESECTION OF THE PROSTATE (TURP);  Surgeon: Marissa Nestle, MD;  Location: [facility];  Service: Urology;  Laterality: N/A;  . Colonoscopy  10/2012    Colonoscopy March 2014 at [facility], difficulty exam requiring fluoroscopy and overtube. Patient developed severe bradycardia again during his procedure like he did [facility]. Multiple polyps removed which were tubular adenomas. Previously tattooed sites at 20 and 30 cm were free of any polypoid change. Left-sided diverticulosis noted.  . Esophagogastroduodenoscopy N/A 05/11/2015    [id number] anemai due to postgastrectomy state/moderate erosive gastritis   Family History: Family History  Problem Relation Age of Onset  . Diabetes Mellitus II Mother   . Allergic rhinitis Mother   . Lung disease Father   . Allergic rhinitis Father   . Angioedema Neg Hx   . Asthma Neg Hx   . Atopy Neg Hx   . Eczema Neg Hx   . Immunodeficiency Neg Hx   . Urticaria Neg Hx   . Cystic fibrosis Neg Hx   . Lupus Neg Hx   . Emphysema Neg Hx   . Migraines Neg Hx    Social History: Social History   Social History  . Marital Status: Married    Spouse Name: N/A  . Number of Children: 5  . Years of Education: N/A   Occupational History  . Retired [employer]   .     Social History Main Topics  . Smoking status: Former Smoker -- 40 years    Types: Cigars    Quit date: 08/21/1988  . Smokeless tobacco: Not on file     Comment: Quit many years ago; few cigars  per day; didn't inhale  . Alcohol Use: 0.5 oz/week    1 Standard drinks or equivalent per week     Comment: rare  . Drug Use: No  . Sexual Activity: Not on file   Other Topics Concern  . Not on file   Social History Narrative   Lives w/ wife    Medications prior to this encounter:    Losartan 100mg  daily.  Valium, Nystop, Triamcinolone and Clobetsol as needed. Outpatient Prescriptions Prior to Visit  Medication Sig Dispense Refill  . ferumoxytol (FERAHEME) 510 MG/17ML SOLN injection Inject 17 mLs (510 mg total) into the vein once. 15.08 mL 0  . furosemide (LASIX) 40 MG tablet Take 40 mg by mouth daily as needed (for swelling ln legs).     . gabapentin (NEURONTIN) 300 MG capsule Take 300 mg by mouth at bedtime.     Marland Kitchen HYDROcodone-acetaminophen (NORCO) 7.5-325 MG  per tablet Take 1 tablet by mouth every 6 (six) hours as needed for moderate pain.    . pantoprazole (PROTONIX) 40 MG tablet Take 40 mg by mouth daily.          Recently off Hydroxyzine and Meclizine. No facility-administered medications prior to visit.    Drug Allergies: Allergies  Allergen Reactions  . Contrast Media [Iodinated Diagnostic Agents]     Pt states he was given "dye" 20 yrs ago, anaphylaxis - was told to never have it again   . Aspirin Other (See Comments)    Blistering   . Latex Rash  . Tape Rash    EKG leads   Environmental History: Kaylin lives in a 80 year old house for 24 years with word and carpeted floors, central air and heat without humidifier pets or smokers; non-feather pillow and comforter.  Review of Systems  Constitutional: Negative for fever, chills, weight loss, malaise/fatigue and diaphoresis.  HENT: Positive for congestion. Negative for ear pain, hearing loss, nosebleeds and sore throat.   Eyes: Negative for discharge and redness.  Respiratory: Negative for cough, sputum production, shortness of breath and wheezing.        Denies history of bronchitis and pneumonia.    Gastrointestinal: Negative for heartburn, nausea, vomiting, abdominal pain, diarrhea and constipation.  Genitourinary: Negative.   Musculoskeletal: Positive for back pain. Negative for myalgias and joint pain.  Skin: Positive for itching. Negative for rash.  Neurological: Negative.  Negative for dizziness, seizures, weakness and headaches.  Endo/Heme/Allergies: Positive for environmental allergies.       Denies sensitivity to aspirin, NSAIDs, stinging insects, foods.    Objective:   Filed Vitals:   09/10/15 1310  BP: 126/80  Pulse: 78  Temp: 97.7 F (36.5 C)  Resp: 16   Physical Exam  Constitutional: He is well-developed, well-nourished, and in no distress.  HENT:  Head: Atraumatic.  Right Ear: Tympanic membrane and ear canal normal.  Left Ear: Tympanic membrane and ear canal normal.  Nose: Mucosal edema (Minimal.) present. No rhinorrhea. No epistaxis.  Mouth/Throat: Oropharynx is clear and moist and mucous membranes are normal. No oropharyngeal exudate, posterior oropharyngeal edema or posterior oropharyngeal erythema.  Eyes: Conjunctivae are normal.  Neck: Neck supple.  Cardiovascular: Normal rate, S1 normal and S2 normal.   No murmur heard. Pulmonary/Chest: Effort normal and breath sounds normal. He has no wheezes. He has no rhonchi. He has no rales.  Abdominal: Soft. Bowel sounds are normal.  Lymphadenopathy:    He has no cervical adenopathy.  Neurological: He is alert.  Skin: Skin is warm, dry and intact. No rash noted. He is not diaphoretic. No cyanosis. Nails show no clubbing.  Generalized dryness with scattered keratoses and nevi and no dermatographism.   Diagnostics: Skin testing, mild reactivity via intradermal testing to selected mold species, cockroach and dust mite mix.    Brenin Heidelberger M. Ishmael Holter, MD   cc: Alonza Bogus, MD

## 2015-09-17 ENCOUNTER — Ambulatory Visit (INDEPENDENT_AMBULATORY_CARE_PROVIDER_SITE_OTHER): Payer: PPO | Admitting: Urology

## 2015-09-17 DIAGNOSIS — I4891 Unspecified atrial fibrillation: Secondary | ICD-10-CM

## 2015-09-17 DIAGNOSIS — R3912 Poor urinary stream: Secondary | ICD-10-CM

## 2015-09-17 DIAGNOSIS — N401 Enlarged prostate with lower urinary tract symptoms: Secondary | ICD-10-CM

## 2015-09-17 DIAGNOSIS — N5201 Erectile dysfunction due to arterial insufficiency: Secondary | ICD-10-CM | POA: Diagnosis not present

## 2015-09-21 DIAGNOSIS — I482 Chronic atrial fibrillation: Secondary | ICD-10-CM | POA: Diagnosis not present

## 2015-09-21 DIAGNOSIS — L299 Pruritus, unspecified: Secondary | ICD-10-CM | POA: Diagnosis not present

## 2015-09-21 DIAGNOSIS — M545 Low back pain: Secondary | ICD-10-CM | POA: Diagnosis not present

## 2015-09-21 DIAGNOSIS — R2689 Other abnormalities of gait and mobility: Secondary | ICD-10-CM | POA: Diagnosis not present

## 2015-09-21 DIAGNOSIS — D649 Anemia, unspecified: Secondary | ICD-10-CM | POA: Diagnosis not present

## 2015-09-21 DIAGNOSIS — I10 Essential (primary) hypertension: Secondary | ICD-10-CM | POA: Diagnosis not present

## 2015-09-21 DIAGNOSIS — R609 Edema, unspecified: Secondary | ICD-10-CM | POA: Diagnosis not present

## 2015-09-27 ENCOUNTER — Encounter: Payer: Self-pay | Admitting: Internal Medicine

## 2015-09-27 ENCOUNTER — Ambulatory Visit (INDEPENDENT_AMBULATORY_CARE_PROVIDER_SITE_OTHER): Payer: PPO | Admitting: Internal Medicine

## 2015-09-27 VITALS — BP 122/84 | HR 86 | Ht 72.0 in | Wt 267.6 lb

## 2015-09-27 DIAGNOSIS — R0602 Shortness of breath: Secondary | ICD-10-CM | POA: Diagnosis not present

## 2015-09-27 DIAGNOSIS — I481 Persistent atrial fibrillation: Secondary | ICD-10-CM | POA: Diagnosis not present

## 2015-09-27 DIAGNOSIS — Z01818 Encounter for other preprocedural examination: Secondary | ICD-10-CM

## 2015-09-27 DIAGNOSIS — I4819 Other persistent atrial fibrillation: Secondary | ICD-10-CM

## 2015-09-27 DIAGNOSIS — R079 Chest pain, unspecified: Secondary | ICD-10-CM | POA: Diagnosis not present

## 2015-09-27 DIAGNOSIS — Z01810 Encounter for preprocedural cardiovascular examination: Secondary | ICD-10-CM | POA: Insufficient documentation

## 2015-09-27 DIAGNOSIS — I10 Essential (primary) hypertension: Secondary | ICD-10-CM

## 2015-09-27 NOTE — Patient Instructions (Signed)
Medication Instructions:  Your physician recommends that you continue on your current medications as directed. Please refer to the Current Medication list given to you today.   Labwork: None ordered   Testing/Procedures: Your physician has requested that you have an echocardiogram. Echocardiography is a painless test that uses sound waves to create images of your heart. It provides your doctor with information about the size and shape of your heart and how well your heart's chambers and valves are working. This procedure takes approximately one hour. There are no restrictions for this procedure.  Your physician has requested that you have a lexiscan myoview. For further information please visit [email or web address]. Please follow instruction sheet, as given.    Follow-Up: Your physician wants you to follow-up in: 6 months with Dr Vallery Ridge will receive a reminder letter in the mail two months in advance. If you don't receive a letter, please call our office to schedule the follow-up appointment.   Any Other Special Instructions Will Be Listed Below (If Applicable).     If you need a refill on your cardiac medications before your next appointment, please call your pharmacy.

## 2015-09-27 NOTE — Progress Notes (Signed)
Electrophysiology Office Note   Date:  09/27/2015   ID:  Bernard, Ramirez [birth year], MRN [id number]  PCP:  Alonza Bogus, MD  Cardiologist:  Previously Dr Lattie Haw (last seen in 2013) Primary Electrophysiologist: Thompson Grayer, MD    Chief Complaint  Patient presents with  . Atrial Fibrillation     History of Present Illness: Bernard Ramirez is a 82 y.o. male who presents today for electrophysiology evaluation.   The patient has longstanding persistent atrial fibrillation (has been in afib since at least 2013).  He also has a h/o gastritis/ peptic ulcers and is s/p prior billroth/ gastrectomy.  He had GI bleeding in September and had EGD which revealed chronic gastritis.  He is s/p prior stroke by EPIC history (though he does not recall this).  He was treated with eliquis but had to stop this due to anemia/ GI bleeding.  He received IV iron in December. He denies recent chest pain but reports shortness of breath with moderate activity.  He does not have palpitations.  He has fatigue and weakness chronically.  He has edema for which he wears support hose.  He snores heavily and has sleep apnea but has not been able to use CPAP.   Per Dr Lattie Haw "Patient only vaguely recalls an evaluation by Dr. Wilhemina Cash In 2004 for chest discomfort and dyspnea. Stress testing revealed a persistent inferior defect, but cardiac catheterization was entirely normal.".  Pt does not recall this at all.  He reports recently have unsteadiness.  He was evaluated by Dr Luan Pulling who has recommended to have a MRI.  He has not yet had this done.    Today, he denies symptoms of palpitations, chest pain, orthopnea, PND,  claudication, dizziness, presyncope, or syncope. The patient is tolerating medications without difficulties and is otherwise without complaint today.    Past Medical History  Diagnosis Date  . Chest pain     2004; with dyspnea, stress nuclear-normal EF + questionable inferior wall ischemia,  normal coronary angiography;  2010-negative stress nuclear  . Vertigo     ED evaluation-06/2012  . Upper GI bleed 1985    1985-peptic ulcer disease; initial hemigastrectomy; subsequent Billroth II  . Anemia     04/2012: H&H-10.2/31.5, MCV-77; 06/2012:12/38.9  . Diarrhea     h/o Hemoccult-positive stool  . Low back pain   . Hypertension   . Benign prostatic hypertrophy     s/p transurethral resection of the prostate  . Persistent atrial fibrillation ([place]) 2013    Asymptomatic; diagnosed in 05/2012  . CVA (cerebral infarction)    Past Surgical History  Procedure Laterality Date  . Rotator cuff repair  1991    Bilateral  . Cholecystectomy    . Vagotomy and pyloroplasty  1970s    Details of procedure uncertain; [id number]  . Transurethral resection of prostate    . Inguinal hernia repair      Left  . Billroth ii    . Colonoscopy  Approximately 2000  . Colonoscopy with esophagogastroduodenoscopy (egd)  06-17-2009    [id number] tubular adenomas and 2 tubulovillous adenomas, incomplete, 3 clips placed  . Esophagogastroduodenoscopy  06/17/2009    SLF: normal/chronic gastritis (non-h pylori)  . Transurethral resection of prostate N/A 12/27/2012    Procedure: TRANSURETHRAL RESECTION OF THE PROSTATE (TURP);  Surgeon: Marissa Nestle, MD;  Location: [facility];  Service: Urology;  Laterality: N/A;  . Colonoscopy  10/2012    Colonoscopy March 2014 at [facility], difficulty exam requiring fluoroscopy and  overtube. Patient developed severe bradycardia again during his procedure like he did [facility]. Multiple polyps removed which were tubular adenomas. Previously tattooed sites at 20 and 30 cm were free of any polypoid change. Left-sided diverticulosis noted.  . Esophagogastroduodenoscopy N/A 05/11/2015    [id number] anemai due to postgastrectomy state/moderate erosive gastritis     Current Outpatient Prescriptions  Medication Sig Dispense Refill  . clobetasol cream (TEMOVATE) [id number] % Apply 1  application topically daily.    . diazepam (VALIUM) 5 MG tablet Take 1 tablet by mouth twice daily as needed for anxiety  2  . furosemide (LASIX) 40 MG tablet Take 40 mg by mouth daily as needed (for swelling ln legs).     Marland Kitchen HYDROcodone-acetaminophen (NORCO) 10-325 MG tablet TAKE 1 TABLET  FIVE TIMES DAILY AS NEEDED FOR PAIN  0  . losartan (COZAAR) 100 MG tablet Take 1 tablet by mouth daily  2  . meclizine (ANTIVERT) 25 MG tablet Take 25 mg by mouth 4 (four) times daily as needed for dizziness.    . nystatin (MYCOSTATIN/NYSTOP) 100000 UNIT/GM POWD Apply topically as directed.     . pantoprazole (PROTONIX) 40 MG tablet Take 40 mg by mouth daily.    Marland Kitchen triamcinolone cream (KENALOG) 0.1 % Apply 1 application topically as directed.     No current facility-administered medications for this visit.    Allergies:   Contrast media; Aspirin; Latex; and Tape   Social History:  The patient  reports that he quit smoking about 27 years ago. His smoking use included Cigars. He does not have any smokeless tobacco history on file. He reports that he drinks about 0.5 oz of alcohol per week. He reports that he does not use illicit drugs.   Family History:  The patient's  family history includes Allergic rhinitis in his father and mother; Diabetes Mellitus II in his mother; Lung disease in his father. There is no history of Angioedema, Asthma, Atopy, Eczema, Immunodeficiency, Urticaria, Cystic fibrosis, Lupus, Emphysema, or Migraines.    ROS:  Please see the history of present illness.   All other systems are reviewed and negative.    PHYSICAL EXAM: VS:  BP 122/84 mmHg  Pulse 86  Ht 6' (1.829 m)  Wt 267 lb 9.6 oz (121.383 kg)  BMI 36.29 kg/m2 , BMI Body mass index is 36.29 kg/(m^2). GEN: overweight, in no acute distress HEENT: normal Neck: no JVD, carotid bruits, or masses Cardiac: iRRR; no murmurs, rubs, or gallops,+ 2 edema  Respiratory:  clear to auscultation bilaterally, normal work of breathing GI:  soft, nontender, nondistended, + BS MS: no deformity or atrophy Skin: warm and dry  Neuro:  Strength and sensation are intact Psych: euthymic mood, full affect  EKG:  EKG is ordered today. The ekg ordered today shows afib 86 bpm, anteriolateral infarction   Recent Labs: 05/21/2015: Hemoglobin 8.9*; Platelets 199    Lipid Panel     Component Value Date/Time   CHOL  05/18/2009 0525    179        ATP III CLASSIFICATION:  <200     mg/dL   Desirable  200-239  mg/dL   Borderline High  >=240    mg/dL   High          TRIG 189* 05/18/2009 0525   HDL 34* 05/18/2009 0525   CHOLHDL 5.3 05/18/2009 0525   VLDL 38 05/18/2009 0525   LDLCALC * 05/18/2009 0525    107  Total Cholesterol/HDL:CHD Risk Coronary Heart Disease Risk Table                     Men   Women  1/2 Average Risk   3.4   3.3  Average Risk       5.0   4.4  2 X Average Risk   9.6   7.1  3 X Average Risk  23.4   11.0        Use the calculated Patient Ratio above and the CHD Risk Table to determine the patient's CHD Risk.        ATP III CLASSIFICATION (LDL):  <100     mg/dL   Optimal  100-129  mg/dL   Near or Above                    Optimal  130-159  mg/dL   Borderline  160-189  mg/dL   High  >190     mg/dL   Very High     Wt Readings from Last 3 Encounters:  09/27/15 267 lb 9.6 oz (121.383 kg)  09/10/15 262 lb 12.6 oz (119.2 kg)  05/11/15 261 lb (118.389 kg)      Other studies Reviewed: Additional studies/ records that were reviewed today include: prior epic notes including Dr Rothbart's notes   ASSESSMENT AND PLAN:  1.  Longstanding persistent atrial fibrillation Appears to have minimal symptoms.  Dr Lattie Haw previously treated with a rate control strategy Will repeat an echo at this time chads2vasc score is at least 5.  Unfortunately, he has ongoing difficulty with bleeding and anemia and is felt to be a poor candidate for long term anticoagulation.  We did discuss left atrial appendage  occlusive therapy (WATCHMAN) as an alternative.  He does not feel that he could take short term anticoagulation and also has an allergy to ASA.  I am therefore reluctant to consider this therapy for his going forward. Continue current therapy at this time  2. SOB He has chronic difficulty with SOB Will obtan an echo Repeat myoview.  If low to moderate risk, would treat with medical therapy.  If high risk, additional CV risk stratification may be required  3. Pre-op Plans for repeat TURP noted Will obtain echo and myoview.  If these are low risk, would proceed with surgery without further workup.  If results are high risk, additional risk assessment may be required.  4. HTN Stable No change required today   Follow-up:  Return to see me in 85months  Current medicines are reviewed at length with the patient today.   The patient does not have concerns regarding his medicines.  The following changes were made today:  none   Signed, Thompson Grayer, MD  09/27/2015 10:50 AM     [facility] [address] [place] [address] [phone] (office) [phone] (fax)

## 2015-10-05 ENCOUNTER — Other Ambulatory Visit (HOSPITAL_COMMUNITY): Payer: Self-pay | Admitting: Pulmonary Disease

## 2015-10-05 ENCOUNTER — Telehealth (HOSPITAL_COMMUNITY): Payer: Self-pay | Admitting: *Deleted

## 2015-10-05 DIAGNOSIS — R2689 Other abnormalities of gait and mobility: Secondary | ICD-10-CM

## 2015-10-05 NOTE — Telephone Encounter (Signed)
Patient given detailed instructions per Myocardial Perfusion Study Information Sheet for the test on 10/07/15 at 0945. Patient notified to arrive 15 minutes early and that it is imperative to arrive on time for appointment to keep from having the test rescheduled.  If you need to cancel or reschedule your appointment, please call the office within 24 hours of your appointment. Failure to do so may result in a cancellation of your appointment, and a $50 no show fee. Patient verbalized understanding.Bernard Ramirez, Ranae Palms

## 2015-10-07 ENCOUNTER — Other Ambulatory Visit: Payer: Self-pay

## 2015-10-07 ENCOUNTER — Ambulatory Visit (HOSPITAL_COMMUNITY): Payer: PPO | Attending: Internal Medicine

## 2015-10-07 ENCOUNTER — Ambulatory Visit (HOSPITAL_BASED_OUTPATIENT_CLINIC_OR_DEPARTMENT_OTHER): Payer: PPO

## 2015-10-07 DIAGNOSIS — I340 Nonrheumatic mitral (valve) insufficiency: Secondary | ICD-10-CM | POA: Insufficient documentation

## 2015-10-07 DIAGNOSIS — R079 Chest pain, unspecified: Secondary | ICD-10-CM | POA: Diagnosis not present

## 2015-10-07 DIAGNOSIS — I071 Rheumatic tricuspid insufficiency: Secondary | ICD-10-CM | POA: Insufficient documentation

## 2015-10-07 DIAGNOSIS — I119 Hypertensive heart disease without heart failure: Secondary | ICD-10-CM | POA: Insufficient documentation

## 2015-10-07 DIAGNOSIS — R0602 Shortness of breath: Secondary | ICD-10-CM | POA: Insufficient documentation

## 2015-10-07 DIAGNOSIS — R0609 Other forms of dyspnea: Secondary | ICD-10-CM | POA: Insufficient documentation

## 2015-10-07 DIAGNOSIS — Z01818 Encounter for other preprocedural examination: Secondary | ICD-10-CM | POA: Insufficient documentation

## 2015-10-07 DIAGNOSIS — I480 Paroxysmal atrial fibrillation: Secondary | ICD-10-CM | POA: Diagnosis not present

## 2015-10-07 DIAGNOSIS — R5383 Other fatigue: Secondary | ICD-10-CM | POA: Insufficient documentation

## 2015-10-07 DIAGNOSIS — R42 Dizziness and giddiness: Secondary | ICD-10-CM | POA: Insufficient documentation

## 2015-10-07 DIAGNOSIS — I272 Other secondary pulmonary hypertension: Secondary | ICD-10-CM | POA: Insufficient documentation

## 2015-10-07 DIAGNOSIS — R0600 Dyspnea, unspecified: Secondary | ICD-10-CM | POA: Diagnosis not present

## 2015-10-07 LAB — MYOCARDIAL PERFUSION IMAGING
LV sys vol: 62 mL
LVDIAVOL: 137 mL
NUC STRESS TID: 0.92
Peak HR: 87 {beats}/min
RATE: 0.25
Rest HR: 65 {beats}/min
SDS: 0
SRS: 4
SSS: 4

## 2015-10-07 MED ORDER — TECHNETIUM TC 99M SESTAMIBI GENERIC - CARDIOLITE
11.0000 | Freq: Once | INTRAVENOUS | Status: AC | PRN
  Administered 2015-10-07: 11 via INTRAVENOUS

## 2015-10-07 MED ORDER — TECHNETIUM TC 99M SESTAMIBI GENERIC - CARDIOLITE
31.6000 | Freq: Once | INTRAVENOUS | Status: AC | PRN
  Administered 2015-10-07: 32 via INTRAVENOUS

## 2015-10-07 MED ORDER — REGADENOSON 0.4 MG/5ML IV SOLN
0.4000 mg | Freq: Once | INTRAVENOUS | Status: AC
  Administered 2015-10-07: 0.4 mg via INTRAVENOUS

## 2015-10-15 ENCOUNTER — Ambulatory Visit (HOSPITAL_COMMUNITY)
Admission: RE | Admit: 2015-10-15 | Discharge: 2015-10-15 | Disposition: A | Discharge status: Home / Self Care | Payer: PPO | Source: Ambulatory Visit | Attending: Pulmonary Disease | Admitting: Pulmonary Disease

## 2015-10-15 DIAGNOSIS — I639 Cerebral infarction, unspecified: Secondary | ICD-10-CM | POA: Diagnosis not present

## 2015-10-15 DIAGNOSIS — R269 Unspecified abnormalities of gait and mobility: Secondary | ICD-10-CM | POA: Diagnosis not present

## 2015-10-15 DIAGNOSIS — I6782 Cerebral ischemia: Secondary | ICD-10-CM | POA: Diagnosis not present

## 2015-10-15 DIAGNOSIS — R2689 Other abnormalities of gait and mobility: Secondary | ICD-10-CM

## 2015-10-25 ENCOUNTER — Telehealth: Payer: Self-pay | Admitting: Internal Medicine

## 2015-10-25 DIAGNOSIS — I4819 Other persistent atrial fibrillation: Secondary | ICD-10-CM

## 2015-10-25 DIAGNOSIS — I272 Pulmonary hypertension, unspecified: Secondary | ICD-10-CM

## 2015-10-25 NOTE — Telephone Encounter (Signed)
Spoke with patient's wife and let her know I would order these test but I'm waiting for Dr [name] to let me know if he can proceed with TURP prior to having the tests or after tests are completed.  She is aware I will call her back after I hear from Dr [name].  The procedure has not been scheduled yet.

## 2015-10-25 NOTE — Telephone Encounter (Signed)
Request for surgical clearance:  1. What type of surgery is being performed? TURP  2. When is this surgery scheduled? Have not scheduled  3. Are there any medications that need to be held prior to surgery and how long? Does he need to stop any bleed thinners?-General Clearance for procedure   4. Name of physician performing surgery?  Dr Irine Seal   5. What is your office phone and fax number? [phone]- Att:Pam 6.

## 2015-10-25 NOTE — Telephone Encounter (Signed)
3. Pre-op Plans for repeat TURP noted Will obtain echo and myoview. If these are low risk, would proceed with surgery without further workup. If results are high risk, additional risk assessment may be required.  Echo: Notes Recorded by Thompson Grayer, MD on 10/08/2015 at 4:37 PM Results reviewed. Please inform pt of result. Pulmonary hypertension appears worse than previous. If he has not had a prior sleep study or recent PFTs, please order.  Myoveiw: Notes Recorded by Thompson Grayer, MD on 10/07/2015 at 3:18 PM Results reviewed. Please inform pt of low risk result.  He is not on any blood thinners and per Dr Jackalyn Lombard note if tests are low risk okay to proceed.    I have left a message for patient to call me in regards to scheduling a sleep study and PFT's if he has not had previously and I will forward to Dr [name] to see if he can proceed with TURP prior to having additional studies.  Spoke with Pam and let her know I had forwarded to MD for response.

## 2015-10-26 NOTE — Telephone Encounter (Signed)
OK to proceed with surgery if medically necessary

## 2015-11-08 ENCOUNTER — Ambulatory Visit (HOSPITAL_COMMUNITY)
Admission: RE | Admit: 2015-11-08 | Discharge: 2015-11-08 | Disposition: A | Discharge status: Home / Self Care | Payer: PPO | Source: Ambulatory Visit | Attending: Internal Medicine | Admitting: Internal Medicine

## 2015-11-08 DIAGNOSIS — I272 Other secondary pulmonary hypertension: Secondary | ICD-10-CM | POA: Diagnosis not present

## 2015-11-08 DIAGNOSIS — I481 Persistent atrial fibrillation: Secondary | ICD-10-CM | POA: Diagnosis not present

## 2015-11-08 DIAGNOSIS — I4819 Other persistent atrial fibrillation: Secondary | ICD-10-CM

## 2015-11-08 LAB — PULMONARY FUNCTION TEST
DL/VA % pred: 70 %
DL/VA: 3.23 ml/min/mmHg/L
DLCO UNC % PRED: 44 %
DLCO UNC: 14.45 ml/min/mmHg
FEF 25-75 PRE: 2.63 L/s
FEF 25-75 Post: 1.56 L/sec
FEF2575-%Change-Post: -40 %
FEF2575-%Pred-Post: 84 %
FEF2575-%Pred-Pre: 142 %
FEV1-%CHANGE-POST: -7 %
FEV1-%PRED-POST: 73 %
FEV1-%PRED-PRE: 79 %
FEV1-POST: 2.02 L
FEV1-Pre: 2.19 L
FEV1FVC-%Change-Post: 3 %
FEV1FVC-%Pred-Pre: 115 %
FEV6-%Change-Post: -11 %
FEV6-%PRED-POST: 65 %
FEV6-%PRED-PRE: 73 %
FEV6-POST: 2.38 L
FEV6-PRE: 2.68 L
FEV6FVC-%PRED-POST: 107 %
FEV6FVC-%PRED-PRE: 107 %
FVC-%CHANGE-POST: -11 %
FVC-%PRED-POST: 60 %
FVC-%PRED-PRE: 68 %
FVC-POST: 2.38 L
FVC-PRE: 2.68 L
PRE FEV6/FVC RATIO: 100 %
Post FEV1/FVC ratio: 85 %
Post FEV6/FVC ratio: 100 %
Pre FEV1/FVC ratio: 82 %
RV % PRED: 80 %
RV: 2.18 L
TLC % pred: 72 %
TLC: 5.09 L

## 2015-11-08 MED ORDER — ALBUTEROL SULFATE (2.5 MG/3ML) 0.083% IN NEBU
2.5000 mg | INHALATION_SOLUTION | Freq: Once | RESPIRATORY_TRACT | Status: AC
  Administered 2015-11-08: 2.5 mg via RESPIRATORY_TRACT

## 2015-11-26 ENCOUNTER — Ambulatory Visit (INDEPENDENT_AMBULATORY_CARE_PROVIDER_SITE_OTHER): Payer: PPO | Admitting: Urology

## 2015-11-26 DIAGNOSIS — R3912 Poor urinary stream: Secondary | ICD-10-CM

## 2015-11-26 DIAGNOSIS — N401 Enlarged prostate with lower urinary tract symptoms: Secondary | ICD-10-CM | POA: Diagnosis not present

## 2015-12-01 DIAGNOSIS — M25562 Pain in left knee: Secondary | ICD-10-CM | POA: Diagnosis not present

## 2015-12-01 DIAGNOSIS — I10 Essential (primary) hypertension: Secondary | ICD-10-CM | POA: Diagnosis not present

## 2015-12-01 DIAGNOSIS — R609 Edema, unspecified: Secondary | ICD-10-CM | POA: Diagnosis not present

## 2015-12-01 DIAGNOSIS — L299 Pruritus, unspecified: Secondary | ICD-10-CM | POA: Diagnosis not present

## 2015-12-01 DIAGNOSIS — N401 Enlarged prostate with lower urinary tract symptoms: Secondary | ICD-10-CM | POA: Diagnosis not present

## 2015-12-02 ENCOUNTER — Other Ambulatory Visit (HOSPITAL_COMMUNITY): Payer: Self-pay | Admitting: Respiratory Therapy

## 2015-12-02 DIAGNOSIS — G4730 Sleep apnea, unspecified: Secondary | ICD-10-CM

## 2015-12-16 DIAGNOSIS — M25562 Pain in left knee: Secondary | ICD-10-CM | POA: Diagnosis not present

## 2015-12-16 DIAGNOSIS — M25561 Pain in right knee: Secondary | ICD-10-CM | POA: Diagnosis not present

## 2015-12-16 DIAGNOSIS — M170 Bilateral primary osteoarthritis of knee: Secondary | ICD-10-CM | POA: Diagnosis not present

## 2015-12-22 ENCOUNTER — Other Ambulatory Visit (HOSPITAL_COMMUNITY): Payer: Self-pay | Admitting: Respiratory Therapy

## 2015-12-22 DIAGNOSIS — G4730 Sleep apnea, unspecified: Secondary | ICD-10-CM

## 2015-12-22 DIAGNOSIS — Z79891 Long term (current) use of opiate analgesic: Secondary | ICD-10-CM | POA: Diagnosis not present

## 2015-12-23 DIAGNOSIS — R262 Difficulty in walking, not elsewhere classified: Secondary | ICD-10-CM | POA: Diagnosis not present

## 2015-12-23 DIAGNOSIS — M25561 Pain in right knee: Secondary | ICD-10-CM | POA: Diagnosis not present

## 2015-12-23 DIAGNOSIS — M25562 Pain in left knee: Secondary | ICD-10-CM | POA: Diagnosis not present

## 2015-12-23 DIAGNOSIS — M170 Bilateral primary osteoarthritis of knee: Secondary | ICD-10-CM | POA: Diagnosis not present

## 2015-12-28 DIAGNOSIS — M1712 Unilateral primary osteoarthritis, left knee: Secondary | ICD-10-CM | POA: Diagnosis not present

## 2015-12-28 DIAGNOSIS — M25562 Pain in left knee: Secondary | ICD-10-CM | POA: Diagnosis not present

## 2015-12-30 DIAGNOSIS — M1711 Unilateral primary osteoarthritis, right knee: Secondary | ICD-10-CM | POA: Diagnosis not present

## 2015-12-30 DIAGNOSIS — M25561 Pain in right knee: Secondary | ICD-10-CM | POA: Diagnosis not present

## 2016-01-03 DIAGNOSIS — M25562 Pain in left knee: Secondary | ICD-10-CM | POA: Diagnosis not present

## 2016-01-03 DIAGNOSIS — M1712 Unilateral primary osteoarthritis, left knee: Secondary | ICD-10-CM | POA: Diagnosis not present

## 2016-01-05 DIAGNOSIS — M25561 Pain in right knee: Secondary | ICD-10-CM | POA: Diagnosis not present

## 2016-01-05 DIAGNOSIS — M1711 Unilateral primary osteoarthritis, right knee: Secondary | ICD-10-CM | POA: Diagnosis not present

## 2016-01-10 DIAGNOSIS — M25562 Pain in left knee: Secondary | ICD-10-CM | POA: Diagnosis not present

## 2016-01-10 DIAGNOSIS — M1712 Unilateral primary osteoarthritis, left knee: Secondary | ICD-10-CM | POA: Diagnosis not present

## 2016-01-12 ENCOUNTER — Ambulatory Visit: Payer: PPO | Attending: Pulmonary Disease | Admitting: Neurology

## 2016-01-12 DIAGNOSIS — I4891 Unspecified atrial fibrillation: Secondary | ICD-10-CM | POA: Diagnosis not present

## 2016-01-12 DIAGNOSIS — M1711 Unilateral primary osteoarthritis, right knee: Secondary | ICD-10-CM | POA: Diagnosis not present

## 2016-01-12 DIAGNOSIS — G4730 Sleep apnea, unspecified: Secondary | ICD-10-CM

## 2016-01-12 DIAGNOSIS — M25561 Pain in right knee: Secondary | ICD-10-CM | POA: Diagnosis not present

## 2016-01-12 DIAGNOSIS — G4733 Obstructive sleep apnea (adult) (pediatric): Secondary | ICD-10-CM | POA: Insufficient documentation

## 2016-01-18 DIAGNOSIS — M25561 Pain in right knee: Secondary | ICD-10-CM | POA: Diagnosis not present

## 2016-01-18 DIAGNOSIS — M25562 Pain in left knee: Secondary | ICD-10-CM | POA: Diagnosis not present

## 2016-01-18 DIAGNOSIS — R05 Cough: Secondary | ICD-10-CM | POA: Diagnosis not present

## 2016-01-18 DIAGNOSIS — M170 Bilateral primary osteoarthritis of knee: Secondary | ICD-10-CM | POA: Diagnosis not present

## 2016-01-22 NOTE — Procedures (Signed)
[place] A. Merlene Laughter, MD     [email or web address]             NOCTURNAL POLYSOMNOGRAPHY   LOCATION: [facility]   Patient Name: Bernard Ramirez, Bernard Ramirez Date: 01/12/2016 Gender: Not Specified D.O.B: [birth year] Age (years): 67 Referring Provider: Not Available Height (inches): 72 Interpreting Physician: Phillips Odor MD, ABSM Weight (lbs): 262 RPSGT: Peak, Robert BMI: 36 MRN: [id number] Neck Size: 17.50 CLINICAL INFORMATION The patient is referred for a split night study with BPAP. MEDICATIONS Medications taken by the patient : N/A  Medications administered by patient during sleep study : Sleep medicine administered - NORCO at 02:46:10 AM  Current outpatient prescriptions:  .  clobetasol cream (TEMOVATE) 4.09 %, Apply 1 application topically daily., Disp: , Rfl:  .  diazepam (VALIUM) 5 MG tablet, Take 1 tablet by mouth twice daily as needed for anxiety, Disp: , Rfl: 2 .  furosemide (LASIX) 40 MG tablet, Take 40 mg by mouth daily as needed (for swelling ln legs). , Disp: , Rfl:  .  HYDROcodone-acetaminophen (NORCO) 10-325 MG tablet, TAKE 1 TABLET  FIVE TIMES DAILY AS NEEDED FOR PAIN, Disp: , Rfl: 0 .  losartan (COZAAR) 100 MG tablet, Take 1 tablet by mouth daily, Disp: , Rfl: 2 .  meclizine (ANTIVERT) 25 MG tablet, Take 25 mg by mouth 4 (four) times daily as needed for dizziness., Disp: , Rfl:  .  nystatin (MYCOSTATIN/NYSTOP) 100000 UNIT/GM POWD, Apply topically as directed. , Disp: , Rfl:  .  pantoprazole (PROTONIX) 40 MG tablet, Take 40 mg by mouth daily., Disp: , Rfl:  .  triamcinolone cream (KENALOG) 0.1 %, Apply 1 application topically as directed., Disp: , Rfl:   SLEEP STUDY TECHNIQUE As per the AASM Manual for the Scoring of Sleep and Associated Events v2.3 (April 2016) with a hypopnea requiring 4% desaturations. The channels recorded and monitored were frontal, central and occipital EEG, electrooculogram (EOG), submentalis EMG (chin), nasal and oral  airflow, thoracic and abdominal wall motion, anterior tibialis EMG, snore microphone, electrocardiogram, and pulse oximetry. Bi-level positive airway pressure (BiPAP) was initiated when the patient met split night criteria and was titrated according to treat sleep-disordered breathing. RESPIRATORY PARAMETERS Diagnostic Total AHI (/hr): 75.4 RDI (/hr): 83.3 OA Index (/hr): 8.9 CA Index (/hr): 6.9 REM AHI (/hr): N/A NREM AHI (/hr): 75.4 Supine AHI (/hr): 93.3 Non-supine AHI (/hr): 72.00 Min O2 Sat (%): 84.00 Mean O2 (%): 91.35 Time below 88% (min): 20.2     Titration: Conventional CPAP 5- 9 resulted in the emergency of severe central sleep apnea. BiPAP 84/ was attempted but was also not successful.  Optimal IPAP Pressure (cm):   Optimal EPAP Pressure (cm):   AHI at Optimal Pressure (/hr): N/A Min O2 at Optimal Pressure (%): 81.00 Sleep % at Optimal (%): N/A Supine % at Optimal (%): N/A         SLEEP ARCHITECTURE The study was initiated at 9:17:34 PM and terminated at 5:04:45 AM. The total recorded time was 467.2 minutes. EEG confirmed total sleep time was 322.8 minutes yielding a sleep efficiency of 69.1%. Sleep onset after lights out was 3.9 minutes with a REM latency of 224.5 minutes. The patient spent 41.82% of the night in stage N1 sleep, 54.15% in stage N2 sleep, 0.00% in stage N3 and 4.03% in REM. Wake after sleep onset (WASO) was 140.5 minutes. The Arousal Index was 66.9/hour. LEG MOVEMENT DATA The total Periodic Limb Movements of Sleep (PLMS) were 0. The PLMS index was 0.00 .  CARDIAC DATA The 2 lead EKG demonstrated atrial fibrillation. The mean heart rate was 70.66 beats per minute. Other EKG findings include: PVCs.   IMPRESSIONS - Severe complex with combination obstructive and central sleep apnea occurred during the diagnostic portion of the study (AHI = 75.4 /hour). An optimal BPAP pressure could not be selected for this patient based on the available study data. I suggest AutoPAP 5- 10.  Also, consider reduction of opioids and benzodiazepines which makes apneic events worse. - Atrial fibrillation.    Delano Metz, MD Diplomate, American Board of Sleep Medicine.

## 2016-03-08 ENCOUNTER — Encounter: Payer: Self-pay | Admitting: Internal Medicine

## 2016-03-08 ENCOUNTER — Telehealth: Payer: Self-pay | Admitting: Internal Medicine

## 2016-03-08 DIAGNOSIS — R3912 Poor urinary stream: Secondary | ICD-10-CM | POA: Diagnosis not present

## 2016-03-08 DIAGNOSIS — N401 Enlarged prostate with lower urinary tract symptoms: Secondary | ICD-10-CM | POA: Diagnosis not present

## 2016-03-08 NOTE — Telephone Encounter (Signed)
New message      Request for surgical clearance:  What type of surgery is being performed?  TURP 1. When is this surgery scheduled?  Pending clearance  Are there any medications that need to be held prior to surgery and how long? Medical clearance Name of physician performing surgery?  Dr Jeffie Pollock 2. What is your office phone and fax number?  Fax [phone]

## 2016-03-13 NOTE — Telephone Encounter (Signed)
Dr [name] reviewed and is okay to proceed with surgery.  Low risk.  Will fax all documents

## 2016-03-14 ENCOUNTER — Other Ambulatory Visit: Payer: Self-pay | Admitting: Urology

## 2016-03-28 ENCOUNTER — Encounter: Payer: Self-pay | Admitting: Internal Medicine

## 2016-04-03 DIAGNOSIS — I10 Essential (primary) hypertension: Secondary | ICD-10-CM | POA: Diagnosis not present

## 2016-04-03 DIAGNOSIS — N401 Enlarged prostate with lower urinary tract symptoms: Secondary | ICD-10-CM | POA: Diagnosis not present

## 2016-04-03 DIAGNOSIS — R609 Edema, unspecified: Secondary | ICD-10-CM | POA: Diagnosis not present

## 2016-04-03 DIAGNOSIS — R0602 Shortness of breath: Secondary | ICD-10-CM | POA: Diagnosis not present

## 2016-04-07 ENCOUNTER — Encounter (HOSPITAL_COMMUNITY): Payer: Self-pay

## 2016-04-07 ENCOUNTER — Encounter (HOSPITAL_COMMUNITY)
Admission: RE | Admit: 2016-04-07 | Discharge: 2016-04-07 | Disposition: A | Discharge status: Home / Self Care | Payer: PPO | Source: Ambulatory Visit | Attending: Urology | Admitting: Urology

## 2016-04-07 DIAGNOSIS — Z01812 Encounter for preprocedural laboratory examination: Secondary | ICD-10-CM | POA: Diagnosis not present

## 2016-04-07 HISTORY — DX: Personal history of urinary calculi: Z87.442

## 2016-04-07 HISTORY — DX: Unspecified osteoarthritis, unspecified site: M19.90

## 2016-04-07 HISTORY — DX: Polyneuropathy, unspecified: G62.9

## 2016-04-07 HISTORY — DX: Pneumonia, unspecified organism: J18.9

## 2016-04-07 HISTORY — DX: Hesitancy of micturition: R39.11

## 2016-04-07 HISTORY — DX: Cramp and spasm: R25.2

## 2016-04-07 HISTORY — DX: Personal history of urinary (tract) infections: Z87.440

## 2016-04-07 HISTORY — DX: Cardiac arrhythmia, unspecified: I49.9

## 2016-04-07 HISTORY — DX: Reserved for inherently not codable concepts without codable children: IMO0001

## 2016-04-07 HISTORY — DX: Gastro-esophageal reflux disease without esophagitis: K21.9

## 2016-04-07 HISTORY — DX: Localized edema: R60.0

## 2016-04-07 HISTORY — DX: Personal history of other medical treatment: Z92.89

## 2016-04-07 LAB — BASIC METABOLIC PANEL
Anion gap: 5 (ref 5–15)
BUN: 16 mg/dL (ref 6–20)
CALCIUM: 9.2 mg/dL (ref 8.9–10.3)
CHLORIDE: 108 mmol/L (ref 101–111)
CO2: 28 mmol/L (ref 22–32)
CREATININE: 1 mg/dL (ref 0.61–1.24)
Glucose, Bld: 119 mg/dL — ABNORMAL HIGH (ref 65–99)
POTASSIUM: 5.1 mmol/L (ref 3.5–5.1)
SODIUM: 141 mmol/L (ref 135–145)

## 2016-04-07 LAB — CBC
HCT: 31.4 % — ABNORMAL LOW (ref 39.0–52.0)
Hemoglobin: 9.1 g/dL — ABNORMAL LOW (ref 13.0–17.0)
MCH: 22.2 pg — ABNORMAL LOW (ref 26.0–34.0)
MCHC: 29 g/dL — ABNORMAL LOW (ref 30.0–36.0)
MCV: 76.8 fL — AB (ref 78.0–100.0)
PLATELETS: 188 10*3/uL (ref 150–400)
RBC: 4.09 MIL/uL — ABNORMAL LOW (ref 4.22–5.81)
RDW: 18.6 % — AB (ref 11.5–15.5)
WBC: 5.9 10*3/uL (ref 4.0–10.5)

## 2016-04-07 NOTE — Progress Notes (Signed)
CBC results/epic/PAT visit 04/07/2016 sent to Dr Jeffie Pollock

## 2016-04-07 NOTE — Patient Instructions (Signed)
PHINEHAS LEESMAN  04/07/2016   Your procedure is scheduled on: April 11, 2016  Report to [facility]  Entrance take [facility]  elevators to 3rd floor to  [place] at 9:00 AM.  Call this number if you have problems the morning of surgery [phone]   Remember: ONLY 1 PERSON MAY GO WITH YOU TO SHORT STAY TO GET  READY MORNING OF [place].  Do not eat food or drink liquids :After Midnight.     Take these medicines the morning of surgery with A SIP OF WATER: Diazepam (Valium) if needed; Hydrocodone-Acetaminophen if needed; Pantoprazole (Protonix)                               You may not have any metal on your body including hair pins and              piercings  Do not wear jewelry, lotions, powders or colognes, deodorant                           Men may shave face and neck.   Do not bring valuables to the hospital. [place].  Contacts, dentures or bridgework may not be worn into surgery.  Leave suitcase in the car. After surgery it may be brought to your room.    Special Instructions: NO SMOKING 24 HOURS PRIOR TO SURGICAL PROCEDURE DATE    _____________________________________________________________________             [facility] - Preparing for Surgery Before surgery, you can play an important role.  Because skin is not sterile, your skin needs to be as free of germs as possible.  You can reduce the number of germs on your skin by washing with CHG (chlorahexidine gluconate) soap before surgery.  CHG is an antiseptic cleaner which kills germs and bonds with the skin to continue killing germs even after washing. Please DO NOT use if you have an allergy to CHG or antibacterial soaps.  If your skin becomes reddened/irritated stop using the CHG and inform your nurse when you arrive at Short Stay. Do not shave (including legs and underarms) for at least 48 hours prior to the first CHG shower.  You  may shave your face/neck. Please follow these instructions carefully:  1.  Shower with CHG Soap the night before surgery and the  morning of Surgery.  2.  If you choose to wash your hair, wash your hair first as usual with your  normal  shampoo.  3.  After you shampoo, rinse your hair and body thoroughly to remove the  shampoo.                           4.  Use CHG as you would any other liquid soap.  You can apply chg directly  to the skin and wash                       Gently with a scrungie or clean washcloth.  5.  Apply the CHG Soap to your body ONLY FROM THE NECK DOWN.   Do not use on face/ open  Wound or open sores. Avoid contact with eyes, ears mouth and genitals (private parts).                       Wash face,  Genitals (private parts) with your normal soap.             6.  Wash thoroughly, paying special attention to the area where your surgery  will be performed.  7.  Thoroughly rinse your body with warm water from the neck down.  8.  DO NOT shower/wash with your normal soap after using and rinsing off  the CHG Soap.                9.  Pat yourself dry with a clean towel.            10.  Wear clean pajamas.            11.  Place clean sheets on your bed the night of your first shower and do not  sleep with pets. Day of Surgery : Do not apply any lotions/deodorants the morning of surgery.  Please wear clean clothes to the hospital/surgery center.  FAILURE TO FOLLOW THESE INSTRUCTIONS MAY RESULT IN THE CANCELLATION OF YOUR SURGERY PATIENT SIGNATURE_________________________________  NURSE SIGNATURE__________________________________  ________________________________________________________________________

## 2016-04-10 ENCOUNTER — Encounter: Payer: Self-pay | Admitting: Internal Medicine

## 2016-04-10 ENCOUNTER — Ambulatory Visit (INDEPENDENT_AMBULATORY_CARE_PROVIDER_SITE_OTHER): Payer: PPO | Admitting: Internal Medicine

## 2016-04-10 ENCOUNTER — Encounter (INDEPENDENT_AMBULATORY_CARE_PROVIDER_SITE_OTHER): Payer: Self-pay

## 2016-04-10 VITALS — BP 142/84 | HR 87 | Ht 72.0 in | Wt 273.0 lb

## 2016-04-10 DIAGNOSIS — M7989 Other specified soft tissue disorders: Secondary | ICD-10-CM

## 2016-04-10 DIAGNOSIS — I4819 Other persistent atrial fibrillation: Secondary | ICD-10-CM

## 2016-04-10 DIAGNOSIS — Z01810 Encounter for preprocedural cardiovascular examination: Secondary | ICD-10-CM

## 2016-04-10 DIAGNOSIS — I481 Persistent atrial fibrillation: Secondary | ICD-10-CM | POA: Diagnosis not present

## 2016-04-10 DIAGNOSIS — R0602 Shortness of breath: Secondary | ICD-10-CM | POA: Diagnosis not present

## 2016-04-10 DIAGNOSIS — G4733 Obstructive sleep apnea (adult) (pediatric): Secondary | ICD-10-CM

## 2016-04-10 LAB — BASIC METABOLIC PANEL
BUN: 14 mg/dL (ref 7–25)
CO2: 27 mmol/L (ref 20–31)
Calcium: 9.1 mg/dL (ref 8.6–10.3)
Chloride: 106 mmol/L (ref 98–110)
Creat: 1.15 mg/dL — ABNORMAL HIGH (ref 0.70–1.11)
GLUCOSE: 104 mg/dL — AB (ref 65–99)
POTASSIUM: 4.6 mmol/L (ref 3.5–5.3)
SODIUM: 141 mmol/L (ref 135–146)

## 2016-04-10 MED ORDER — FUROSEMIDE 40 MG PO TABS: ORAL_TABLET | ORAL | 11 refills | Status: DC

## 2016-04-10 MED ORDER — DEXTROSE 5 % IV SOLN
3.0000 g | INTRAVENOUS | Status: AC
  Administered 2016-04-11: 3 g via INTRAVENOUS
  Filled 2016-04-10: qty 3

## 2016-04-10 NOTE — Progress Notes (Signed)
Electrophysiology Office Note   Date:  04/10/2016   ID:  Bernard, Ramirez Mar 06, 1931, MRN [id number]  PCP:  Alonza Bogus, MD  Cardiologist:  Previously Dr Lattie Haw (last seen in 2013) Primary Electrophysiologist: Thompson Grayer, MD    Chief Complaint  Patient presents with  . Atrial Fibrillation     History of Present Illness: Bernard Ramirez is a 82 y.o. male who presents today for electrophysiology follow-up.   The patient has longstanding persistent atrial fibrillation (has been in afib since at least 2013).  He also has a h/o gastritis/ peptic ulcers and is s/p prior billroth/ gastrectomy.  He had GI bleeding in September and had EGD which revealed chronic gastritis.  He is s/p prior stroke by EPIC history (though he does not recall this).  He was treated with eliquis but had to stop this due to anemia/ GI bleeding.  He received IV iron in December. He has had worsening edema.  He has not been wearing his support hose.  He has also not been taking his lasix due to urinary frequency.  He has TURP planned for tomorrow.  He snores heavily and has sleep apnea but has not been able to use CPAP.  Today, he denies symptoms of palpitations, chest pain, orthopnea, PND,  claudication, dizziness, presyncope, or syncope. The patient is tolerating medications without difficulties and is otherwise without complaint today.    Past Medical History:  Diagnosis Date  . Anemia    04/2012: H&H-10.2/31.5, MCV-77; 06/2012:12/38.9  . Arthritis   . Benign prostatic hypertrophy    s/p transurethral resection of the prostate  . Chest pain    2004; with dyspnea, stress nuclear-normal EF + questionable inferior wall ischemia, normal coronary angiography;  2010-negative stress nuclear  . CVA (cerebral infarction)    asymptomatic (seen on CT)  . Diarrhea    h/o Hemoccult-positive stool  . Dysrhythmia   . Edema of both legs   . GERD (gastroesophageal reflux disease)   . History of blood  transfusion   . History of kidney stones   . History of urinary tract infection   . Hypertension   . Jerking movements of extremities    left arm   . Low back pain   . Obstructive sleep apnea    pt states can not use the CPAP  . Peripheral neuropathy (HCC)    lower legs and feet bilat   . Persistent atrial fibrillation ([place]) 2013   Asymptomatic; diagnosed in 05/2012  . Pneumonia   . Shortness of breath dyspnea    pt states only develops SOB if tries to do something too quickly  . Upper GI bleed 1985   1985-peptic ulcer disease; initial hemigastrectomy; subsequent Billroth II  . Urinary hesitancy   . Vertigo    ED evaluation-06/2012   Past Surgical History:  Procedure Laterality Date  . Billroth II    . CHOLECYSTECTOMY    . COLONOSCOPY  Approximately 2000  . COLONOSCOPY  10/2012   Colonoscopy March 2014 at [facility], difficulty exam requiring fluoroscopy and overtube. Patient developed severe bradycardia again during his procedure like he did [facility]. Multiple polyps removed which were tubular adenomas. Previously tattooed sites at 20 and 30 cm were free of any polypoid change. Left-sided diverticulosis noted.  . COLONOSCOPY WITH ESOPHAGOGASTRODUODENOSCOPY (EGD)  06-17-2009   [id number] tubular adenomas and 2 tubulovillous adenomas, incomplete, 3 clips placed  . ESOPHAGOGASTRODUODENOSCOPY  06/17/2009   SLF: normal/chronic gastritis (non-h pylori)  . ESOPHAGOGASTRODUODENOSCOPY  N/A 05/11/2015   [id number] anemai due to postgastrectomy state/moderate erosive gastritis  . INGUINAL HERNIA REPAIR     Left  . ROTATOR CUFF REPAIR  1991   Bilateral  . TRANSURETHRAL RESECTION OF PROSTATE    . TRANSURETHRAL RESECTION OF PROSTATE N/A 12/27/2012   Procedure: TRANSURETHRAL RESECTION OF THE PROSTATE (TURP);  Surgeon: Marissa Nestle, MD;  Location: [facility];  Service: Urology;  Laterality: N/A;  . VAGOTOMY AND PYLOROPLASTY  1970s   Details of procedure uncertain; [id number]     Current  Outpatient Prescriptions  Medication Sig Dispense Refill  . clobetasol cream (TEMOVATE) [id number] % Apply 1 application topically daily.    . diazepam (VALIUM) 5 MG tablet Take 1 tablet by mouth twice daily as needed for anxiety  2  . furosemide (LASIX) 40 MG tablet Take 40 mg by mouth daily AS DIRECTED 45 tablet 11  . HYDROcodone-acetaminophen (NORCO) 10-325 MG tablet TAKE 1 TABLET  FIVE TIMES DAILY AS NEEDED FOR PAIN  0  . losartan (COZAAR) 100 MG tablet Take 1 tablet by mouth daily  2  . meclizine (ANTIVERT) 25 MG tablet Take 25 mg by mouth 4 (four) times daily as needed for dizziness.    . nystatin (MYCOSTATIN/NYSTOP) 100000 UNIT/GM POWD Apply 1 g topically daily as needed (rash).     . pantoprazole (PROTONIX) 40 MG tablet Take 40 mg by mouth daily.    . tamsulosin (FLOMAX) 0.4 MG CAPS capsule Take 0.4 mg by mouth daily.    Marland Kitchen triamcinolone cream (KENALOG) 0.1 % Apply 1 application topically daily.      No current facility-administered medications for this visit.    Facility-Administered Medications Ordered in Other Visits  Medication Dose Route Frequency Provider Last Rate Last Dose  . ceFAZolin (ANCEF) 3 g in dextrose 5 % 50 mL IVPB  3 g Intravenous 30 min Pre-Op Irine Seal, MD        Allergies:   Contrast media [iodinated diagnostic agents]; Gabapentin; Aspirin; Latex; and Tape   Social History:  The patient  reports that he quit smoking about 12 years ago. His smoking use included Cigars. He quit after 12.00 years of use. He has never used smokeless tobacco. He reports that he does not drink alcohol or use drugs.   Family History:  The patient's  family history includes Allergic rhinitis in his father and mother; Diabetes Mellitus II in his mother; Lung disease in his father.    ROS:  Please see the history of present illness.   All other systems are reviewed and negative.    PHYSICAL EXAM: VS:  BP (!) 142/84   Pulse 87   Ht 6' (1.829 m)   Wt 273 lb (123.8 kg)   BMI 37.03 kg/m   , BMI Body mass index is 37.03 kg/m. GEN: overweight, in no acute distress  HEENT: normal  Neck: no JVD, carotid bruits, or masses Cardiac: iRRR; no murmurs, rubs, or gallops,+ 2 edema  Respiratory:  clear to auscultation bilaterally, normal work of breathing GI: soft, nontender, nondistended, + BS MS: no deformity or atrophy  Skin: warm and dry  Neuro:  Strength and sensation are intact Psych: euthymic mood, full affect  EKG:  EKG is ordered today. The ekg ordered today shows rate controlled atrial fibrillation   Recent Labs: 04/07/2016: BUN 16; Creatinine, Ser 1.00; Hemoglobin 9.1; Platelets 188; Potassium 5.1; Sodium 141    Lipid Panel     Component Value Date/Time   CHOL  05/18/2009 0525  179        ATP III CLASSIFICATION:  <200     mg/dL   Desirable  200-239  mg/dL   Borderline High  >=240    mg/dL   High          TRIG 189 (H) 05/18/2009 0525   HDL 34 (L) 05/18/2009 0525   CHOLHDL 5.3 05/18/2009 0525   VLDL 38 05/18/2009 0525   LDLCALC (H) 05/18/2009 0525    107        Total Cholesterol/HDL:CHD Risk Coronary Heart Disease Risk Table                     Men   Women  1/2 Average Risk   3.4   3.3  Average Risk       5.0   4.4  2 X Average Risk   9.6   7.1  3 X Average Risk  23.4   11.0        Use the calculated Patient Ratio above and the CHD Risk Table to determine the patient's CHD Risk.        ATP III CLASSIFICATION (LDL):  <100     mg/dL   Optimal  100-129  mg/dL   Near or Above                    Optimal  130-159  mg/dL   Borderline  160-189  mg/dL   High  >190     mg/dL   Very High     Wt Readings from Last 3 Encounters:  04/10/16 273 lb (123.8 kg)  04/07/16 275 lb 2 oz (124.8 kg)  01/12/16 262 lb (118.8 kg)      ASSESSMENT AND PLAN:  1.  Longstanding persistent atrial fibrillation Continue rate control long term. chads2vasc score is at least 5.  Unfortunately, he has ongoing difficulty with bleeding and anemia and is felt to be a poor  candidate for long term anticoagulation.  We did discuss left atrial appendage occlusive therapy (WATCHMAN) as an alternative.  He does not feel that he could take short term anticoagulation and also has an allergy to ASA.  I am therefore reluctant to consider this therapy for his going forward. No changes today  2. SOB Previous echo and myoview were low risk PFTs revealed mild interestitial process, possibly age related No further workup planned.  3. Pre-op Plans for repeat TURP noted Ok to proceed if medically indicated without further CV testing.  4. HTN Stable No change required today  5. OSA He is not compliant with therapy  6. Edema Likely multifactoral He is noncompliant with lasix and support hose. I have encouraged compliance with these.  After TURP maybe he will be more compliant with lasix.  I have advised lasix 40mg  BID x 3 days then 40mg  daily. bmet today 2 gram sodium diet  Return for follow-up on edema with Truitt Merle in 2 weeks I will see in 2 months   Signed, Thompson Grayer, MD  04/10/2016 9:56 PM     [place] [address] [place] [address] [phone] (office) [phone] (fax)

## 2016-04-10 NOTE — H&P (Signed)
CC: BPH  HPI: Bernard Ramirez is a 82 year-old male established patient who is here for follow up regarding further evaluation of BPH and lower urinary tract symptoms.  The patient complains of lower urinary tract symptom(s) that include straining.   Mr. Buggy returns today with worsening LUTs. He almost had to go to the ER over the weekend for impending retention but he was able to avoid it. He has had difficulty foley placement in the past. The stream is very slow and he has to strain. He has intermittency and doesn't feel empty. He has nocturia q1.5hr with small voids. He has had no hematuria or dysuria. He has mild left groin pain today. He has had a prior TURP but has a prostatic stricture with obstruction. He remains on finasteride and tamsulosin. He has been recommended to have a TURP in the past and is now interested.      ALLERGIES: Aspirin TABS Contrast Dye    MEDICATIONS: Clobetasol Propionate GEL External  DiazePAM TABS Oral  Finasteride 5 MG Oral Tablet 0 Oral Daily  Furosemide 40 MG Oral Tablet Oral  Hydrocodone-Acetaminophen 10-325 MG Oral Tablet Oral  Losartan Potassium TABS Oral  Meclizine HCl - 25 MG Oral Tablet Oral  Pantoprazole Sodium 40 MG Oral Tablet Delayed Release Oral  Triamterene-HCTZ TABS Oral     GU PSH: Biopsy Kidney - 04/16/2015 Hernia Repair - 04/16/2015      PSH Notes: Dilation Of Male Urethral Stricture, Biopsy Renal, Suture Of Gastric Ulcer, Prostate Surgery, Shoulder Surgery, Cholecystectomy, Hernia Repair   NON-GU PSH: Cholecystectomy - 04/16/2015    GU PMH: BPH w/LUTS, Benign prostatic hyperplasia with urinary obstruction - 11/30/2015 Poor urinary stream, Weak urine stream - 11/30/2015 ED, arterial insufficiency, Erectile dysfunction due to arterial insufficiency - 09/17/2015 Dorsalgia, Unspec, Chronic back pain - 04/16/2015 Prostate Disorder, Unspec, Prostate disorder - 04/13/2015    NON-GU PMH: Unspecified atrial fibrillation, Atrial  fibrillation - 09/17/2015 Personal history of other diseases of the circulatory system, History of hypertension - 04/16/2015 Personal history of other diseases of the musculoskeletal system and connective tissue, History of arthritis - 04/16/2015 Personal history of other endocrine, nutritional and metabolic disease, History of diabetic neuropathy - 04/16/2015 Prediabetes, Borderline diabetes - 04/16/2015 Encounter for general adult medical examination without abnormal findings, Encounter for preventive health examination    FAMILY HISTORY: Deceased - Runs In Family   SOCIAL HISTORY: Marital Status: Married Current Smoking Status: Patient does not smoke anymore. Has not smoked since 02/24/2016.  Drinks 3 caffeinated drinks per day. Patient's occupation is/was retired.     Notes: Married, No alcohol use, Retired, Number of children, Former tobacco use   REVIEW OF SYSTEMS:    GU Review Male:   Patient reports stream starts and stops, trouble starting your stream, have to strain to urinate , and erection problems. Patient denies frequent urination, hard to postpone urination, burning/ pain with urination, get up at night to urinate, leakage of urine, and penile pain.  Gastrointestinal (Upper):   Patient denies nausea, vomiting, and indigestion/ heartburn.  Gastrointestinal (Lower):   Patient denies diarrhea and constipation.  Constitutional:   Patient denies fever, night sweats, weight loss, and fatigue.  Skin:   Patient denies skin rash/ lesion and itching.  Eyes:   Patient denies blurred vision and double vision.  Ears/ Nose/ Throat:   Patient denies sore throat and sinus problems.  Hematologic/Lymphatic:   Patient denies easy bruising and swollen glands.  Cardiovascular:   Patient reports leg swelling. Patient  denies chest pains.  Respiratory:   Patient reports shortness of breath. Patient denies cough.  Endocrine:   Patient denies excessive thirst.  Musculoskeletal:   Patient denies back pain  and joint pain.  Neurological:   Patient denies headaches and dizziness.  Psychologic:   Patient denies depression and anxiety.   VITAL SIGNS:      03/08/2016 11:17 AM  Weight 260 lb / 117.93 kg  BP 156/90 mmHg  Pulse 64 /min  Temperature 97.8 F / 37 C   MULTI-SYSTEM PHYSICAL EXAMINATION:    Constitutional: Well-nourished. No physical deformities. Normally developed. Good grooming.  Respiratory: No labored breathing, no use of accessory muscles.   Cardiovascular: RRR without murmur. He has bilateral edema to the calves.   Gastrointestinal: Abdominal tenderness mild in mid abdomen., obese. No mass, no rigidity.      PAST DATA REVIEWED:  Source Of History:  Patient  Urine Test Review:   Urinalysis   PROCEDURES:          Urinalysis - 81003 Dipstick Dipstick Cont'd  Specimen: Voided Bilirubin: Neg  Color: Yellow Ketones: Neg  Appearance: Clear Blood: Neg  Specific Gravity: 1.010 Protein: Neg  pH: 7.0 Urobilinogen: 0.2  Glucose: Neg Nitrites: Neg    Leukocyte Esterase: Neg    ASSESSMENT:      ICD-10 Details  1 GU:   BPH w/LUTS - N40.1 Worsening - He had near retention over the weekend. he did take some robitussin which may have aggravated the problem.   2   Poor urinary stream - R39.12 Worsening - He stream has declined.    PLAN:           Schedule Return Visit: Next Available Appointment - Schedule Surgery  Return Notes: He has been having worsening voiding complaints.   Procedure: Unspecified Date - Cystoscopy TURP [id number] Notes: Next available          Document Letter(s):  Created for Patient: Clinical Summary         Notes:   He would like to go ahead with a TURP for his progessive LUTs.   I have reviewed the risks of the procedure including bleeding, infection, injury to adjacent structures, incontinence, recurrent stricture, fluid overload, thrombotic events and anesthetic complications.   I will get him cleared by Dr. [name].   CC: Dr. Velvet Bathe and Dr.  Thompson Grayer.

## 2016-04-10 NOTE — Patient Instructions (Addendum)
Medication Instructions:  Your physician has recommended you make the following change in your medication:  1) Increase Furosemide to 40 mg twice daily for 3 days then reduce to 40 mg daily    Labwork: Your physician recommends that you return for lab work today:BMP    Testing/Procedures: None ordered   Follow-Up:  Your physician recommends that you schedule a follow-up appointment in: 2 weeks with Truitt Merle, NP and 6 weeks with Dr [name]    Low-Sodium Eating Plan Sodium raises blood pressure and causes water to be held in the body. Getting less sodium from food will help lower your blood pressure, reduce any swelling, and protect your heart, liver, and kidneys. We get sodium by adding salt (sodium chloride) to food. Most of our sodium comes from canned, boxed, and frozen foods. Restaurant foods, fast foods, and pizza are also very high in sodium. Even if you take medicine to lower your blood pressure or to reduce fluid in your body, getting less sodium from your food is important. WHAT IS MY PLAN? Most people should limit their sodium intake to 2,300 mg a day. Your health care provider recommends that you limit your sodium intake to 2 grams a day.  WHAT DO I NEED TO KNOW ABOUT THIS EATING PLAN? For the low-sodium eating plan, you will follow these general guidelines:  Choose foods with a % Daily Value for sodium of less than 5% (as listed on the food label).   Use salt-free seasonings or herbs instead of table salt or sea salt.   Check with your health care provider or pharmacist before using salt substitutes.   Eat fresh foods.  Eat more vegetables and fruits.  Limit canned vegetables. If you do use them, rinse them well to decrease the sodium.   Limit cheese to 1 oz (28 g) per day.   Eat lower-sodium products, often labeled as "lower sodium" or "no salt added."  Avoid foods that contain monosodium glutamate (MSG). MSG is sometimes added to [place] food and  some canned foods.  Check food labels (Nutrition Facts labels) on foods to learn how much sodium is in one serving.  Eat more home-cooked food and less restaurant, buffet, and fast food.  When eating at a restaurant, ask that your food be prepared with less salt, or no salt if possible.  HOW DO I READ FOOD LABELS FOR SODIUM INFORMATION? The Nutrition Facts label lists the amount of sodium in one serving of the food. If you eat more than one serving, you must multiply the listed amount of sodium by the number of servings. Food labels may also identify foods as:  Sodium free--Less than 5 mg in a serving.  Very low sodium--35 mg or less in a serving.  Low sodium--140 mg or less in a serving.  Light in sodium--50% less sodium in a serving. For example, if a food that usually has 300 mg of sodium is changed to become light in sodium, it will have 150 mg of sodium.  Reduced sodium--25% less sodium in a serving. For example, if a food that usually has 400 mg of sodium is changed to reduced sodium, it will have 300 mg of sodium. WHAT FOODS CAN I EAT? Grains Low-sodium cereals, including oats, puffed wheat and rice, and shredded wheat cereals. Low-sodium crackers. Unsalted rice and pasta. Lower-sodium bread.  Vegetables Frozen or fresh vegetables. Low-sodium or reduced-sodium canned vegetables. Low-sodium or reduced-sodium tomato sauce and paste. Low-sodium or reduced-sodium tomato and vegetable juices.  Fruits Fresh, frozen, and canned fruit. Fruit juice.  Meat and Other Protein Products Low-sodium canned tuna and salmon. Fresh or frozen meat, poultry, seafood, and fish. Lamb. Unsalted nuts. Dried beans, peas, and lentils without added salt. Unsalted canned beans. Homemade soups without salt. Eggs.  Dairy Milk. Soy milk. Ricotta cheese. Low-sodium or reduced-sodium cheeses. Yogurt.  Condiments Fresh and dried herbs and spices. Salt-free seasonings. Onion and garlic powders.  Low-sodium varieties of mustard and ketchup. Fresh or refrigerated horseradish. Lemon juice.  Fats and Oils Reduced-sodium salad dressings. Unsalted butter.  Other Unsalted popcorn and pretzels.  The items listed above may not be a complete list of recommended foods or beverages. Contact your dietitian for more options. WHAT FOODS ARE NOT RECOMMENDED? Grains Instant hot cereals. Bread stuffing, pancake, and biscuit mixes. Croutons. Seasoned rice or pasta mixes. Noodle soup cups. Boxed or frozen macaroni and cheese. Self-rising flour. Regular salted crackers. Vegetables Regular canned vegetables. Regular canned tomato sauce and paste. Regular tomato and vegetable juices. Frozen vegetables in sauces. Salted [place] fries. Olives. Angie Fava. Relishes. Sauerkraut. Salsa. Meat and Other Protein Products Salted, canned, smoked, spiced, or pickled meats, seafood, or fish. Bacon, ham, sausage, hot dogs, corned beef, chipped beef, and packaged luncheon meats. Salt pork. Jerky. Pickled herring. Anchovies, regular canned tuna, and sardines. Salted nuts. Dairy Processed cheese and cheese spreads. Cheese curds. Blue cheese and cottage cheese. Buttermilk.  Condiments Onion and garlic salt, seasoned salt, table salt, and sea salt. Canned and packaged gravies. Worcestershire sauce. Tartar sauce. Barbecue sauce. Teriyaki sauce. Soy sauce, including reduced sodium. Steak sauce. Fish sauce. Oyster sauce. Cocktail sauce. Horseradish that you find on the shelf. Regular ketchup and mustard. Meat flavorings and tenderizers. Bouillon cubes. Hot sauce. Tabasco sauce. Marinades. Taco seasonings. Relishes. Fats and Oils Regular salad dressings. Salted butter. Margarine. Ghee. Bacon fat.  Other Potato and tortilla chips. Corn chips and puffs. Salted popcorn and pretzels. Canned or dried soups. Pizza. Frozen entrees and pot pies.  The items listed above may not be a complete list of foods and beverages to avoid.  Contact your dietitian for more information.   This information is not intended to replace advice given to you by your health care provider. Make sure you discuss any questions you have with your health care provider.   Document Released: 01/27/2002 Document Revised: 08/28/2014 Document Reviewed: 06/11/2013 Elsevier Interactive Patient Education [employer].

## 2016-04-11 ENCOUNTER — Ambulatory Visit (HOSPITAL_COMMUNITY): Payer: PPO | Admitting: Anesthesiology

## 2016-04-11 ENCOUNTER — Encounter (HOSPITAL_COMMUNITY)
Admission: RE | Disposition: A | Discharge status: Home / Self Care | Payer: Self-pay | Source: Ambulatory Visit | Attending: Urology

## 2016-04-11 ENCOUNTER — Encounter (HOSPITAL_COMMUNITY): Payer: Self-pay | Admitting: *Deleted

## 2016-04-11 ENCOUNTER — Observation Stay (HOSPITAL_COMMUNITY)
Admission: RE | Admit: 2016-04-11 | Discharge: 2016-04-13 | Disposition: A | Discharge status: Home / Self Care | Payer: PPO | Source: Ambulatory Visit | Attending: Urology | Admitting: Urology

## 2016-04-11 DIAGNOSIS — Z87891 Personal history of nicotine dependence: Secondary | ICD-10-CM | POA: Diagnosis not present

## 2016-04-11 DIAGNOSIS — R3912 Poor urinary stream: Secondary | ICD-10-CM | POA: Diagnosis not present

## 2016-04-11 DIAGNOSIS — C61 Malignant neoplasm of prostate: Principal | ICD-10-CM | POA: Insufficient documentation

## 2016-04-11 DIAGNOSIS — Z79899 Other long term (current) drug therapy: Secondary | ICD-10-CM | POA: Insufficient documentation

## 2016-04-11 DIAGNOSIS — I481 Persistent atrial fibrillation: Secondary | ICD-10-CM | POA: Diagnosis not present

## 2016-04-11 DIAGNOSIS — I10 Essential (primary) hypertension: Secondary | ICD-10-CM | POA: Diagnosis not present

## 2016-04-11 DIAGNOSIS — G4733 Obstructive sleep apnea (adult) (pediatric): Secondary | ICD-10-CM | POA: Diagnosis not present

## 2016-04-11 DIAGNOSIS — N138 Other obstructive and reflux uropathy: Secondary | ICD-10-CM | POA: Diagnosis present

## 2016-04-11 DIAGNOSIS — Z8673 Personal history of transient ischemic attack (TIA), and cerebral infarction without residual deficits: Secondary | ICD-10-CM | POA: Diagnosis not present

## 2016-04-11 DIAGNOSIS — N990 Postprocedural (acute) (chronic) kidney failure: Secondary | ICD-10-CM | POA: Diagnosis not present

## 2016-04-11 DIAGNOSIS — E1140 Type 2 diabetes mellitus with diabetic neuropathy, unspecified: Secondary | ICD-10-CM | POA: Diagnosis not present

## 2016-04-11 DIAGNOSIS — K219 Gastro-esophageal reflux disease without esophagitis: Secondary | ICD-10-CM | POA: Diagnosis not present

## 2016-04-11 DIAGNOSIS — N401 Enlarged prostate with lower urinary tract symptoms: Secondary | ICD-10-CM | POA: Diagnosis not present

## 2016-04-11 DIAGNOSIS — N179 Acute kidney failure, unspecified: Secondary | ICD-10-CM | POA: Insufficient documentation

## 2016-04-11 DIAGNOSIS — N289 Disorder of kidney and ureter, unspecified: Secondary | ICD-10-CM | POA: Diagnosis not present

## 2016-04-11 DIAGNOSIS — N9989 Other postprocedural complications and disorders of genitourinary system: Secondary | ICD-10-CM

## 2016-04-11 HISTORY — PX: TRANSURETHRAL RESECTION OF PROSTATE: SHX73

## 2016-04-11 LAB — BASIC METABOLIC PANEL
Anion gap: 6 (ref 5–15)
BUN: 17 mg/dL (ref 6–20)
CALCIUM: 8.9 mg/dL (ref 8.9–10.3)
CO2: 28 mmol/L (ref 22–32)
CREATININE: 1.11 mg/dL (ref 0.61–1.24)
Chloride: 106 mmol/L (ref 101–111)
GFR calc Af Amer: >60 mL/min (ref >60)
GFR calc non Af Amer: 59 mL/min — ABNORMAL LOW (ref >60)
GLUCOSE: 120 mg/dL — AB (ref 65–99)
Potassium: 4.4 mmol/L (ref 3.5–5.1)
Sodium: 140 mmol/L (ref 135–145)

## 2016-04-11 SURGERY — TURP (TRANSURETHRAL RESECTION OF PROSTATE)
Anesthesia: General

## 2016-04-11 MED ORDER — SENNOSIDES-DOCUSATE SODIUM 8.6-50 MG PO TABS: 1.0000 | ORAL_TABLET | Freq: Every evening | ORAL | Status: DC | PRN

## 2016-04-11 MED ORDER — METOCLOPRAMIDE HCL 5 MG/ML IJ SOLN: 10.0000 mg | Freq: Once | INTRAMUSCULAR | Status: DC | PRN

## 2016-04-11 MED ORDER — LOSARTAN POTASSIUM 50 MG PO TABS
100.0000 mg | ORAL_TABLET | Freq: Every day | ORAL | Status: DC
  Administered 2016-04-11 – 2016-04-12 (×2): 100 mg via ORAL
  Filled 2016-04-11 (×2): qty 2

## 2016-04-11 MED ORDER — LIDOCAINE HCL (CARDIAC) 20 MG/ML IV SOLN
INTRAVENOUS | Status: DC | PRN
  Administered 2016-04-11: 50 mg via INTRAVENOUS

## 2016-04-11 MED ORDER — TRIAMCINOLONE ACETONIDE 0.1 % EX CREA
1.0000 "application " | TOPICAL_CREAM | Freq: Every day | CUTANEOUS | Status: DC
  Administered 2016-04-12: 1 via TOPICAL
  Filled 2016-04-11: qty 15

## 2016-04-11 MED ORDER — HYOSCYAMINE SULFATE 0.125 MG SL SUBL
0.1250 mg | SUBLINGUAL_TABLET | SUBLINGUAL | Status: DC | PRN
  Filled 2016-04-11 (×2): qty 1

## 2016-04-11 MED ORDER — FENTANYL CITRATE (PF) 100 MCG/2ML IJ SOLN
INTRAMUSCULAR | Status: AC
  Filled 2016-04-11: qty 2

## 2016-04-11 MED ORDER — FLEET ENEMA 7-19 GM/118ML RE ENEM: 1.0000 | ENEMA | Freq: Once | RECTAL | Status: DC | PRN

## 2016-04-11 MED ORDER — PROPOFOL 10 MG/ML IV BOLUS
INTRAVENOUS | Status: AC
  Filled 2016-04-11: qty 20

## 2016-04-11 MED ORDER — HYDROCODONE-ACETAMINOPHEN 10-325 MG PO TABS
1.0000 | ORAL_TABLET | Freq: Four times a day (QID) | ORAL | Status: DC | PRN
  Administered 2016-04-11 – 2016-04-13 (×4): 1 via ORAL
  Filled 2016-04-11 (×4): qty 1

## 2016-04-11 MED ORDER — MECLIZINE HCL 25 MG PO TABS: 25.0000 mg | ORAL_TABLET | Freq: Four times a day (QID) | ORAL | Status: DC | PRN

## 2016-04-11 MED ORDER — DOCUSATE SODIUM 100 MG PO CAPS
100.0000 mg | ORAL_CAPSULE | Freq: Two times a day (BID) | ORAL | Status: DC
  Administered 2016-04-11 – 2016-04-12 (×3): 100 mg via ORAL
  Filled 2016-04-11 (×4): qty 1

## 2016-04-11 MED ORDER — DIPHENHYDRAMINE HCL 50 MG/ML IJ SOLN
12.5000 mg | Freq: Four times a day (QID) | INTRAMUSCULAR | Status: DC | PRN
  Administered 2016-04-12: 12.5 mg via INTRAVENOUS
  Filled 2016-04-11: qty 1

## 2016-04-11 MED ORDER — FUROSEMIDE 10 MG/ML IJ SOLN
INTRAMUSCULAR | Status: AC
  Filled 2016-04-11: qty 2

## 2016-04-11 MED ORDER — PHENYLEPHRINE HCL 10 MG/ML IJ SOLN
INTRAMUSCULAR | Status: DC | PRN
  Administered 2016-04-11 (×4): 80 ug via INTRAVENOUS

## 2016-04-11 MED ORDER — ONDANSETRON HCL 4 MG/2ML IJ SOLN: 4.0000 mg | INTRAMUSCULAR | Status: DC | PRN

## 2016-04-11 MED ORDER — NYSTATIN 100000 UNIT/GM EX POWD
1.0000 g | Freq: Every day | CUTANEOUS | Status: DC | PRN
  Filled 2016-04-11: qty 15

## 2016-04-11 MED ORDER — MEPERIDINE HCL 50 MG/ML IJ SOLN: 6.2500 mg | INTRAMUSCULAR | Status: DC | PRN

## 2016-04-11 MED ORDER — KCL IN DEXTROSE-NACL 20-5-0.45 MEQ/L-%-% IV SOLN
INTRAVENOUS | Status: DC
  Administered 2016-04-11 – 2016-04-12 (×2): via INTRAVENOUS
  Filled 2016-04-11 (×3): qty 1000

## 2016-04-11 MED ORDER — DIPHENHYDRAMINE HCL 12.5 MG/5ML PO ELIX
12.5000 mg | ORAL_SOLUTION | Freq: Four times a day (QID) | ORAL | Status: DC | PRN
  Administered 2016-04-12: 12.5 mg via ORAL
  Filled 2016-04-11: qty 5

## 2016-04-11 MED ORDER — CLOBETASOL PROPIONATE 0.05 % EX CREA
1.0000 "application " | TOPICAL_CREAM | Freq: Every day | CUTANEOUS | Status: DC
  Administered 2016-04-12: 1 via TOPICAL
  Filled 2016-04-11: qty 15

## 2016-04-11 MED ORDER — EPHEDRINE SULFATE 50 MG/ML IJ SOLN
INTRAMUSCULAR | Status: DC | PRN
  Administered 2016-04-11 (×4): 5 mg via INTRAVENOUS

## 2016-04-11 MED ORDER — BISACODYL 10 MG RE SUPP: 10.0000 mg | Freq: Every day | RECTAL | Status: DC | PRN

## 2016-04-11 MED ORDER — LIDOCAINE HCL (CARDIAC) 20 MG/ML IV SOLN
INTRAVENOUS | Status: AC
  Filled 2016-04-11: qty 5

## 2016-04-11 MED ORDER — DIAZEPAM 5 MG PO TABS: 5.0000 mg | ORAL_TABLET | Freq: Two times a day (BID) | ORAL | Status: DC | PRN

## 2016-04-11 MED ORDER — LACTATED RINGERS IV SOLN
INTRAVENOUS | Status: DC
  Administered 2016-04-11: 1000 mL via INTRAVENOUS

## 2016-04-11 MED ORDER — FENTANYL CITRATE (PF) 100 MCG/2ML IJ SOLN
INTRAMUSCULAR | Status: DC | PRN
  Administered 2016-04-11: 12.5 ug via INTRAVENOUS
  Administered 2016-04-11: 50 ug via INTRAVENOUS
  Administered 2016-04-11: 12.5 ug via INTRAVENOUS

## 2016-04-11 MED ORDER — FUROSEMIDE 40 MG PO TABS
40.0000 mg | ORAL_TABLET | Freq: Every day | ORAL | Status: DC
  Administered 2016-04-11 – 2016-04-12 (×2): 40 mg via ORAL
  Filled 2016-04-11 (×2): qty 1

## 2016-04-11 MED ORDER — PANTOPRAZOLE SODIUM 40 MG PO TBEC
40.0000 mg | DELAYED_RELEASE_TABLET | Freq: Every day | ORAL | Status: DC
  Administered 2016-04-11 – 2016-04-12 (×2): 40 mg via ORAL
  Filled 2016-04-11 (×2): qty 1

## 2016-04-11 MED ORDER — ZOLPIDEM TARTRATE 5 MG PO TABS: 5.0000 mg | ORAL_TABLET | Freq: Every evening | ORAL | Status: DC | PRN

## 2016-04-11 MED ORDER — PROPOFOL 10 MG/ML IV BOLUS
INTRAVENOUS | Status: DC | PRN
  Administered 2016-04-11: 150 mg via INTRAVENOUS

## 2016-04-11 MED ORDER — SODIUM CHLORIDE 0.9 % IR SOLN
Status: DC | PRN
  Administered 2016-04-11: 10000 mL

## 2016-04-11 MED ORDER — ONDANSETRON HCL 4 MG/2ML IJ SOLN
INTRAMUSCULAR | Status: AC
  Filled 2016-04-11: qty 2

## 2016-04-11 MED ORDER — FENTANYL CITRATE (PF) 100 MCG/2ML IJ SOLN: 25.0000 ug | INTRAMUSCULAR | Status: DC | PRN

## 2016-04-11 MED ORDER — PHENYLEPHRINE 40 MCG/ML (10ML) SYRINGE FOR IV PUSH (FOR BLOOD PRESSURE SUPPORT)
PREFILLED_SYRINGE | INTRAVENOUS | Status: AC
  Filled 2016-04-11: qty 10

## 2016-04-11 MED ORDER — ONDANSETRON HCL 4 MG/2ML IJ SOLN
INTRAMUSCULAR | Status: DC | PRN
  Administered 2016-04-11: 4 mg via INTRAVENOUS

## 2016-04-11 MED ORDER — ACETAMINOPHEN 325 MG PO TABS: 650.0000 mg | ORAL_TABLET | ORAL | Status: DC | PRN

## 2016-04-11 MED ORDER — HYDROMORPHONE HCL 1 MG/ML IJ SOLN
0.5000 mg | INTRAMUSCULAR | Status: DC | PRN
  Administered 2016-04-12 (×3): 1 mg via INTRAVENOUS
  Filled 2016-04-11 (×3): qty 1

## 2016-04-11 SURGICAL SUPPLY — 17 items
BAG URINE DRAINAGE (UROLOGICAL SUPPLIES) ×2 IMPLANT
BAG URO CATCHER STRL LF (MISCELLANEOUS) ×3 IMPLANT
CATH FOLEY 3WAY 30CC 22FR (CATHETERS) IMPLANT
CATH SILICONE 22FR 30CC 3WAY (CATHETERS) ×2 IMPLANT
ELECT REM PT RETURN 9FT ADLT (ELECTROSURGICAL)
ELECTRODE REM PT RTRN 9FT ADLT (ELECTROSURGICAL) ×1 IMPLANT
GLOVE SURG SS PI 8.0 STRL IVOR (GLOVE) IMPLANT
GOWN STRL REUS W/TWL XL LVL3 (GOWN DISPOSABLE) ×3 IMPLANT
HOLDER FOLEY CATH W/STRAP (MISCELLANEOUS) ×2 IMPLANT
LOOP CUT BIPOLAR 24F LRG (ELECTROSURGICAL) ×2 IMPLANT
MANIFOLD NEPTUNE II (INSTRUMENTS) ×3 IMPLANT
PACK CYSTO (CUSTOM PROCEDURE TRAY) ×3 IMPLANT
SET ASPIRATION TUBING (TUBING) ×1 IMPLANT
SYR 30ML LL (SYRINGE) ×2 IMPLANT
SYRINGE IRR TOOMEY STRL 70CC (SYRINGE) IMPLANT
TUBING CONNECTING 10 (TUBING) ×2 IMPLANT
TUBING CONNECTING 10' (TUBING) ×1

## 2016-04-11 NOTE — Op Note (Signed)
NAMEDAESHON, KEAN NO.:  [id number]  MEDICAL RECORD NO.:  [id number]  LOCATION:  [facility]                         FACILITY:  [facility]  PHYSICIAN:  Marshall Cork. Jeffie Pollock, M.D.    DATE OF BIRTH:  1931-02-21  DATE OF PROCEDURE:  04/11/2016 DATE OF DISCHARGE:                              OPERATIVE REPORT   INDICATIONS:  Mr. Grudzinski is an 82 year old white male with history of BPH and bladder outlet obstruction with prior TURP, but obstructing regrowth and persistent symptoms on finasteride and alpha-blockers.  He was elected to undergo repeat TURP.  FINDINGS AND PROCEDURE:  He was taken to the operating room where general anesthetic was induced.  He was placed in lithotomy position and was fitted with PAS hose.  He was given Ancef.  His urethra was calibrated to 30-French with Leander Rams sounds and a 28-French continuous flow resectoscope sheath was placed with the aid of a visual obturator.  Once in position, the visual obturator was replaced with the [facility] handle with the bipolar loop and 30-degree lens.  Inspection revealed a normal urethra.  The external sphincter was intact.  There was apical regrowth, right greater than left in the prostate with evidence of the TUR defect proximally.  The bladder had mild-to-moderate trabeculation.  No tumors, stones or inflammation were noted, the ureteral orifices were in their normal anatomic position well away from the bladder neck.  After initial inspection, resection of the recurrent tissue was initiated, I resected some on the right floor out to the veru and then the right lateral lobe from bladder neck to apex.  During this resection, I entered into a cavity that was filled with frondular tissue, which was very suspicious for either high-grade prostatic neoplasm or possibly urothelial carcinoma.  I resected this substantially; however, was not able to resect all the way to the base of the tumorous growth.  During that  portion of the resection, I did get to the prostatic capsule and a small capsular perforation was noted.  The left lobe of the prostate was then resected with the bulk of the tissue being resected from the apical regrowth and also some anteriorly.  Once the very adequate channel had been created, the bladder was evacuated free of chips, actually earlier.  A separate specimen was obtained from the right prostatic wall when I initially encountered the papillary fronds.  Once I had evacuated the chips and inspected the bladder, the ureteral orifices were noted to be intact.  I observed the area of the perforation and the apparent tumor.  There was no significant bleeding.  At this point, with confirmation that there was no active bleeding and no retained chips, the scope was removed, pressure on the bladder produced a good stream.  A 22-French 3-way latex-free Foley catheter was placed.  The balloon was filled with 30 mL of sterile fluid.  The catheter was irrigated with clear return and placed to continuous irrigation and straight drainage.  The remainder of the specimen was collected to send to Pathology.  There were no complications during the procedure.     Marshall Cork. Jeffie Pollock, M.D.     JJW/MEDQ  D:  04/11/2016  T:  04/11/2016  Job:  [id number]

## 2016-04-11 NOTE — Anesthesia Postprocedure Evaluation (Signed)
Anesthesia Post Note  Patient: Chetan C Piehl  Procedure(s) Performed: Procedure(s) (LRB): TRANSURETHRAL RESECTION OF THE PROSTATE (TURP) (N/A)  Patient location during evaluation: PACU Anesthesia Type: General Level of consciousness: awake and alert Pain management: pain level controlled Vital Signs Assessment: post-procedure vital signs reviewed and stable Respiratory status: spontaneous breathing, nonlabored ventilation, respiratory function stable and patient connected to nasal cannula oxygen Cardiovascular status: blood pressure returned to baseline and stable Postop Assessment: no signs of nausea or vomiting Anesthetic complications: no    Last Vitals:  Vitals:   04/11/16 1245 04/11/16 1305  BP: (!) 145/77 (!) 142/64  Pulse: 77 75  Resp: 19 16  Temp: 36.5 C 36.4 C    Last Pain:  Vitals:   04/11/16 1345  TempSrc:   PainSc: 4                  Montez Hageman

## 2016-04-11 NOTE — Anesthesia Preprocedure Evaluation (Signed)
Anesthesia Evaluation  Patient identified by MRN, date of birth, ID band Patient awake    Reviewed: Allergy & Precautions, NPO status , Patient's Chart, lab work & pertinent test results  Airway Mallampati: II  TM Distance: >3 FB Neck ROM: Full    Dental no notable dental hx. (+) Edentulous Upper, Edentulous Lower   Pulmonary sleep apnea , former smoker,    Pulmonary exam normal breath sounds clear to auscultation       Cardiovascular hypertension, Pt. on medications Normal cardiovascular exam+ dysrhythmias Atrial Fibrillation  Rhythm:Regular Rate:Normal     Neuro/Psych CVA, No Residual Symptoms negative psych ROS   GI/Hepatic negative GI ROS, Neg liver ROS,   Endo/Other  negative endocrine ROS  Renal/GU negative Renal ROS  negative genitourinary   Musculoskeletal negative musculoskeletal ROS (+)   Abdominal   Peds negative pediatric ROS (+)  Hematology negative hematology ROS (+)   Anesthesia Other Findings   Reproductive/Obstetrics negative OB ROS                             Anesthesia Physical Anesthesia Plan  ASA: II  Anesthesia Plan: General   Post-op Pain Management:    Induction: Intravenous  Airway Management Planned: LMA  Additional Equipment:   Intra-op Plan:   Post-operative Plan: Extubation in OR  Informed Consent: I have reviewed the patients History and Physical, chart, labs and discussed the procedure including the risks, benefits and alternatives for the proposed anesthesia with the patient or authorized representative who has indicated his/her understanding and acceptance.   Dental advisory given  Plan Discussed with: CRNA  Anesthesia Plan Comments:         Anesthesia Quick Evaluation

## 2016-04-11 NOTE — Transfer of Care (Signed)
Immediate Anesthesia Transfer of Care Note  Patient: Bernard Ramirez  Procedure(s) Performed: Procedure(s): TRANSURETHRAL RESECTION OF THE PROSTATE (TURP) (N/A)  Patient Location: PACU  Anesthesia Type:General  Level of Consciousness: Patient easily awoken, sedated, comfortable, cooperative, following commands, responds to stimulation.   Airway & Oxygen Therapy: Patient spontaneously breathing, ventilating well, oxygen via simple oxygen mask.  Post-op Assessment: Report given to PACU RN, vital signs reviewed and stable, moving all extremities.   Post vital signs: Reviewed and stable.  Complications: No apparent anesthesia complications

## 2016-04-11 NOTE — Brief Op Note (Signed)
04/11/2016  11:54 AM  PATIENT:  Bernard Ramirez  82 y.o. male  PRE-OPERATIVE DIAGNOSIS:  BPH WITH BLADDER OUTLET OBSTRUCTION  POST-OPERATIVE DIAGNOSIS:  BPH WITH BLADDER OUTLET OBSTRUCTION  PROCEDURE:  Procedure(s): TRANSURETHRAL RESECTION OF THE PROSTATE (TURP) (N/A) FOR REGROWTH   SURGEON:  Surgeon(s) and Role:    * Irine Seal, MD - Primary  PHYSICIAN ASSISTANT:   ASSISTANTS: none   ANESTHESIA:   general  EBL:  Total I/O In: -  Out: 5 [Blood:5]  BLOOD ADMINISTERED:none  DRAINS: Urinary Catheter (Foley)   LOCAL MEDICATIONS USED:  NONE  SPECIMEN:  Source of Specimen:  prostate tissue  DISPOSITION OF SPECIMEN:  PATHOLOGY  COUNTS:  YES  TOURNIQUET:  * No tourniquets in log *  DICTATION: .Other Dictation: Dictation Number [id number]  PLAN OF CARE: Admit for overnight observation  PATIENT DISPOSITION:  PACU - hemodynamically stable.   Delay start of Pharmacological VTE agent (>24hrs) due to surgical blood loss or risk of bleeding: yes

## 2016-04-11 NOTE — Anesthesia Procedure Notes (Signed)
Procedure Name: LMA Insertion Date/Time: 04/11/2016 11:17 AM Performed by: Deliah Boston Pre-anesthesia Checklist: Patient identified, Emergency Drugs available, Suction available and Patient being monitored Patient Re-evaluated:Patient Re-evaluated prior to inductionOxygen Delivery Method: Circle system utilized Preoxygenation: Pre-oxygenation with 100% oxygen Intubation Type: IV induction Ventilation: Mask ventilation without difficulty LMA: LMA inserted LMA Size: 5.0 Number of attempts: 1 Placement Confirmation: positive ETCO2 and breath sounds checked- equal and bilateral Tube secured with: Tape Dental Injury: Teeth and Oropharynx as per pre-operative assessment

## 2016-04-11 NOTE — Interval H&P Note (Signed)
History and Physical Interval Note:  04/11/2016 10:47 AM  Bernard Ramirez  has presented today for surgery, with the diagnosis of BPH WITH BLADDER OUTLET OBSTRUCTION  The various methods of treatment have been discussed with the patient and family. After consideration of risks, benefits and other options for treatment, the patient has consented to  Procedure(s): TRANSURETHRAL RESECTION OF THE PROSTATE (TURP) (N/A) as a surgical intervention .  The patient's history has been reviewed, patient examined, no change in status, stable for surgery.  I have reviewed the patient's chart and labs.  Questions were answered to the patient's satisfaction.     Aritha Huckeba J

## 2016-04-12 ENCOUNTER — Encounter (HOSPITAL_COMMUNITY): Payer: Self-pay | Admitting: Urology

## 2016-04-12 DIAGNOSIS — C61 Malignant neoplasm of prostate: Secondary | ICD-10-CM | POA: Diagnosis not present

## 2016-04-12 LAB — BASIC METABOLIC PANEL
ANION GAP: 5 (ref 5–15)
BUN: 20 mg/dL (ref 6–20)
CALCIUM: 8.8 mg/dL — AB (ref 8.9–10.3)
CHLORIDE: 103 mmol/L (ref 101–111)
CO2: 28 mmol/L (ref 22–32)
Creatinine, Ser: 1.37 mg/dL — ABNORMAL HIGH (ref 0.61–1.24)
GFR calc Af Amer: 53 mL/min — ABNORMAL LOW (ref >60)
GFR calc non Af Amer: 46 mL/min — ABNORMAL LOW (ref >60)
GLUCOSE: 136 mg/dL — AB (ref 65–99)
Potassium: 4.8 mmol/L (ref 3.5–5.1)
Sodium: 136 mmol/L (ref 135–145)

## 2016-04-12 NOTE — Progress Notes (Signed)
Foley removed at 0920. Pt voided 5 times 7a-7p shift without difficulty. Urine yellow and cloudy. No foul odor. See I&O record. On-call Urology MD made aware via telephone.

## 2016-04-12 NOTE — Progress Notes (Signed)
Patient ID: Bernard Ramirez, male   DOB: 10/08/1930, 82 y.o.   MRN: [id number] 1 Day Post-Op  Subjective: Bernard Ramirez is doing well post TURP with no pain and clear urine.  He had a small right capsular perforation and probable prostate cancer noted at surgery.   His Cr is up to 1.37.   ROS:  Review of Systems  Cardiovascular: Positive for leg swelling.  Neurological:       He has burning in the ankles which is chronic    Anti-infectives: Anti-infectives    Start     Dose/Rate Route Frequency Ordered Stop   04/10/16 1342  ceFAZolin (ANCEF) 3 g in dextrose 5 % 50 mL IVPB     3 g 130 mL/hr over 30 Minutes Intravenous 30 min pre-op 04/10/16 1342 04/11/16 1118      Current Facility-Administered Medications  Medication Dose Route Frequency Provider Last Rate Last Dose  . acetaminophen (TYLENOL) tablet 650 mg  650 mg Oral Q4H PRN Irine Seal, MD      . bisacodyl (DULCOLAX) suppository 10 mg  10 mg Rectal Daily PRN Irine Seal, MD      . clobetasol cream (TEMOVATE) [id number] % 1 application  1 application Topical Daily Irine Seal, MD      . dextrose 5 % and 0.45 % NaCl with KCl 20 mEq/L infusion   Intravenous Continuous Irine Seal, MD 75 mL/hr at 04/12/16 0245    . diazepam (VALIUM) tablet 5 mg  5 mg Oral Q12H PRN Irine Seal, MD      . diphenhydrAMINE (BENADRYL) injection 12.5 mg  12.5 mg Intravenous Q6H PRN Irine Seal, MD   12.5 mg at 04/12/16 0029   Or  . diphenhydrAMINE (BENADRYL) 12.5 MG/5ML elixir 12.5 mg  12.5 mg Oral Q6H PRN Irine Seal, MD      . docusate sodium (COLACE) capsule 100 mg  100 mg Oral BID Irine Seal, MD   100 mg at 04/11/16 2129  . furosemide (LASIX) tablet 40 mg  40 mg Oral Daily Irine Seal, MD   40 mg at 04/11/16 1346  . HYDROcodone-acetaminophen (NORCO) 10-325 MG per tablet 1 tablet  1 tablet Oral Q6H PRN Irine Seal, MD   1 tablet at 04/11/16 2129  . HYDROmorphone (DILAUDID) injection 0.5-1 mg  0.5-1 mg Intravenous Q2H PRN Irine Seal, MD   1 mg at 04/12/16 0348  .  hyoscyamine (LEVSIN SL) SL tablet 0.125 mg  0.125 mg Sublingual Q4H PRN Irine Seal, MD      . losartan (COZAAR) tablet 100 mg  100 mg Oral Daily Irine Seal, MD   100 mg at 04/11/16 1345  . meclizine (ANTIVERT) tablet 25 mg  25 mg Oral QID PRN Irine Seal, MD      . nystatin (MYCOSTATIN/NYSTOP) topical powder 1 g  1 g Topical Daily PRN Irine Seal, MD      . ondansetron [facility]) injection 4 mg  4 mg Intravenous Q4H PRN Irine Seal, MD      . pantoprazole (PROTONIX) EC tablet 40 mg  40 mg Oral Daily Irine Seal, MD   40 mg at 04/11/16 1345  . senna-docusate (Senokot-S) tablet 1 tablet  1 tablet Oral QHS PRN Irine Seal, MD      . sodium phosphate (FLEET) 7-19 GM/118ML enema 1 enema  1 enema Rectal Once PRN Irine Seal, MD      . triamcinolone cream (KENALOG) 0.1 % 1 application  1 application Topical Daily Irine Seal, MD      .  zolpidem (AMBIEN) tablet 5 mg  5 mg Oral QHS PRN Irine Seal, MD         Objective: Vital signs in last 24 hours: Temp:  [97.6 F (36.4 C)-98.7 F (37.1 C)] 98.7 F (37.1 C) (08/23 0455) Pulse Rate:  [68-86] 78 (08/23 0455) Resp:  [16-22] 18 (08/23 0455) BP: (109-162)/(64-89) 109/79 (08/23 0455) SpO2:  [95 %-100 %] 96 % (08/23 0455) Weight:  [123.8 kg (273 lb)] 123.8 kg (273 lb) (08/22 0900)  Intake/Output from previous day: 08/22 0701 - 08/23 0700 In: 9875 [P.O.:720; I.V.:1355] Out: [id number]; Blood:5] Intake/Output this shift: Total I/O In: [id number] [P.O.:480; I.V.:600; Other:3000] Out: 5500 [Urine:5500]   Physical Exam  Genitourinary:  Genitourinary Comments: Urine clear in foley tube  Musculoskeletal: He exhibits edema (with erythema that is chronic.).    Lab Results:  No results for input(s): WBC, HGB, HCT, PLT in the last 72 hours. BMET  Recent Labs  04/11/16 1332 04/12/16 0514  NA 140 136  K 4.4 4.8  CL 106 103  CO2 28 28  GLUCOSE 120* 136*  BUN 17 20  CREATININE 1.11 1.37*  CALCIUM 8.9 8.8*   PT/INR No results for input(s):  LABPROT, INR in the last 72 hours. ABG No results for input(s): PHART, HCO3 in the last 72 hours.  Invalid input(s): PCO2, PO2  Studies/Results: No results found.   Assessment and Plan: Doing well post TURP with clear urine. Probable prostate cancer but path pending. ARI this am.  Plan to d/c foley in am and discontinue home. BMP in am.  Further evaluation and management will be based on the path. D/C CBI and heplock IV this am.       LOS: 0 days    Malka So 04/12/2016 [phone]

## 2016-04-13 ENCOUNTER — Other Ambulatory Visit: Payer: Self-pay | Admitting: Urology

## 2016-04-13 DIAGNOSIS — N9989 Other postprocedural complications and disorders of genitourinary system: Secondary | ICD-10-CM

## 2016-04-13 DIAGNOSIS — C61 Malignant neoplasm of prostate: Secondary | ICD-10-CM

## 2016-04-13 DIAGNOSIS — N289 Disorder of kidney and ureter, unspecified: Secondary | ICD-10-CM | POA: Diagnosis not present

## 2016-04-13 LAB — BASIC METABOLIC PANEL
Anion gap: 7 (ref 5–15)
BUN: 16 mg/dL (ref 6–20)
CALCIUM: 8.9 mg/dL (ref 8.9–10.3)
CO2: 27 mmol/L (ref 22–32)
Chloride: 103 mmol/L (ref 101–111)
Creatinine, Ser: 1.01 mg/dL (ref 0.61–1.24)
GFR calc Af Amer: >60 mL/min (ref >60)
GLUCOSE: 125 mg/dL — AB (ref 65–99)
Potassium: 3.9 mmol/L (ref 3.5–5.1)
SODIUM: 137 mmol/L (ref 135–145)

## 2016-04-13 NOTE — Discharge Instructions (Signed)
Transurethral Resection of the Prostate, Care After °Refer to this sheet in the next few weeks. These instructions provide you with information on caring for yourself after your procedure. Your caregiver also may give you specific instructions. Your treatment has been planned according to current medical practices, but complications sometimes occur. Call your caregiver if you have any problems or questions after your procedure. °HOME CARE INSTRUCTIONS  °Recovery can take 4-6 weeks. Avoid alcohol, caffeinated drinks, and spicy foods for 2 weeks after your procedure. Drink enough fluids to keep your urine clear or pale yellow. Urinate as soon as you feel the urge to do so. Do not try to hold your urine for long periods of time. °During recovery you may experience pain caused by bladder spasms, which result in a very intense urge to urinate. Take all medicines as directed by your caregiver, including medicines for pain. Try to limit the amount of pain medicines you take because it can cause constipation. If you do become constipated, do not strain to move your bowels. Straining can increase bleeding. Constipation can be minimized by increasing the amount fluids and fiber in your diet. Your caregiver also may prescribe a stool softener. °Do not lift heavy objects (more than 5 lb [2.25 kg]) or perform exercises that cause you to strain for at least 1 month after your procedure. When sitting, you may want to sit in a soft chair or use a cushion. For the first 10 days after your procedure, avoid the following activities: °· Running. °· Strenuous work. °· Long walks. °· Riding in a car for extended periods. °· Sex. °SEEK MEDICAL CARE IF: °· You have difficulty urinating. °· You have blood in your urine that does not go away after you rest or increase your fluid intake. °· You have swelling in your penis or scrotum. °SEEK IMMEDIATE MEDICAL CARE IF:  °· You are suddenly unable to urinate. °· You notice blood clots in your  urine. °· You have chills. °· You have a fever. °· You have pain in your back or lower abdomen. °· You have pain or swelling in your legs. °MAKE SURE YOU:  °· Understand these instructions. °· Will watch your condition. °· Will get help right away if you are not doing well or get worse. °  °This information is not intended to replace advice given to you by your health care provider. Make sure you discuss any questions you have with your health care provider. °  °Document Released: 08/07/2005 Document Revised: 08/28/2014 Document Reviewed: 09/15/2011 °Elsevier Interactive Patient Education ©2016 [employer] ° °

## 2016-04-13 NOTE — Discharge Summary (Signed)
Physician Discharge Summary  Patient ID: Bernard Ramirez MRN: [id number] DOB/AGE: November 20, 1930 82 y.o.  Admit date: 04/11/2016 Discharge date: 04/13/2016  Admission Diagnoses:  BPH (benign prostatic hypertrophy) with urinary obstruction  Discharge Diagnoses:  Principal Problem:   BPH (benign prostatic hypertrophy) with urinary obstruction Active Problems:   Prostate cancer ([place])   Acute renal insufficiency due to procedure [facility])   Past Medical History:  Diagnosis Date  . Anemia    04/2012: H&H-10.2/31.5, MCV-77; 06/2012:12/38.9  . Arthritis   . Benign prostatic hypertrophy    s/p transurethral resection of the prostate  . Chest pain    2004; with dyspnea, stress nuclear-normal EF + questionable inferior wall ischemia, normal coronary angiography;  2010-negative stress nuclear  . CVA (cerebral infarction)    asymptomatic (seen on CT)  . Diarrhea    h/o Hemoccult-positive stool  . Dysrhythmia   . Edema of both legs   . GERD (gastroesophageal reflux disease)   . History of blood transfusion   . History of kidney stones   . History of urinary tract infection   . Hypertension   . Jerking movements of extremities    left arm   . Low back pain   . Obstructive sleep apnea    pt states can not use the CPAP  . Peripheral neuropathy (HCC)    lower legs and feet bilat   . Persistent atrial fibrillation ([place]) 2013   Asymptomatic; diagnosed in 05/2012  . Pneumonia   . Shortness of breath dyspnea    pt states only develops SOB if tries to do something too quickly  . Upper GI bleed 1985   1985-peptic ulcer disease; initial hemigastrectomy; subsequent Billroth II  . Urinary hesitancy   . Vertigo    ED evaluation-06/2012    Surgeries: Procedure(s): TRANSURETHRAL RESECTION OF THE PROSTATE (TURP) on 04/11/2016   Consultants (if any):   Discharged Condition: Improved  Hospital Course: Bernard Ramirez is an 82 y.o. male who was admitted 04/11/2016 with a diagnosis of BPH (benign  prostatic hypertrophy) with urinary obstruction and went to the operating room on 04/11/2016 and underwent the above named procedures.  He was found to have a tumor focus in the right lateral prostate about 2cm in size and high grade in appearance.  This proved to be Gleason 9 prostate cancer.   He had a small capsular perforation on the right and the foley was left 2 days as a result.  He had a transient rise in his Cr to 1.37 on POD 1 but that normalized on POD 2.  His foley was removed this morning and he is voiding well with clear urine.     He was given perioperative antibiotics:  Anti-infectives    Start     Dose/Rate Route Frequency Ordered Stop   04/10/16 1342  ceFAZolin (ANCEF) 3 g in dextrose 5 % 50 mL IVPB     3 g 130 mL/hr over 30 Minutes Intravenous 30 min pre-op 04/10/16 1342 04/11/16 1118    .  He was given sequential compression devices for DVT prophylaxis.  He benefited maximally from the hospital stay and there were no complications.    Recent vital signs:  Vitals:   04/12/16 2156 04/13/16 0524  BP: (!) 155/96 123/69  Pulse: 87 73  Resp: 20 20  Temp: 98.4 F (36.9 C) 99.2 F (37.3 C)    Recent laboratory studies:  Lab Results  Component Value Date   HGB 9.1 (L) 04/07/2016  HGB 8.9 (L) 05/21/2015   HGB 8.5 (L) 05/11/2015   Lab Results  Component Value Date   WBC 5.9 04/07/2016   PLT 188 04/07/2016   Lab Results  Component Value Date   INR 1.19 05/03/2015   Lab Results  Component Value Date   NA 137 04/13/2016   K 3.9 04/13/2016   CL 103 04/13/2016   CO2 27 04/13/2016   BUN 16 04/13/2016   CREATININE 1.01 04/13/2016   GLUCOSE 125 (H) 04/13/2016    Discharge Medications:     Medication List    TAKE these medications   clobetasol cream 0.05 % Commonly known as:  TEMOVATE Apply 1 application topically daily.   diazepam 5 MG tablet Commonly known as:  VALIUM Take 1 tablet by mouth twice daily as needed for anxiety   furosemide 40 MG  tablet Commonly known as:  LASIX Take 40 mg by mouth daily AS DIRECTED   HYDROcodone-acetaminophen 10-325 MG tablet Commonly known as:  NORCO TAKE 1 TABLET  FIVE TIMES DAILY AS NEEDED FOR PAIN   losartan 100 MG tablet Commonly known as:  COZAAR Take 1 tablet by mouth daily   meclizine 25 MG tablet Commonly known as:  ANTIVERT Take 25 mg by mouth 4 (four) times daily as needed for dizziness.   nystatin powder Generic drug:  nystatin Apply 1 g topically daily as needed (rash).   pantoprazole 40 MG tablet Commonly known as:  PROTONIX Take 40 mg by mouth daily.   triamcinolone cream 0.1 % Commonly known as:  KENALOG Apply 1 application topically daily.       Diagnostic Studies: No results found.  Disposition: 01-Home or Self Care  Discharge Instructions    Discontinue IV    Complete by:  As directed      Follow-up Information    Malka So, MD Follow up on 04/26/2016.   Specialty:  Urology Why:  2:30 Contact information: [place] [id number] [phone]          He will have a [place] and CT [facility] along with a PSA obtained prior to f/u.   SignedMalka So 04/13/2016, 7:12 AM

## 2016-04-19 ENCOUNTER — Encounter (HOSPITAL_COMMUNITY)
Admission: RE | Admit: 2016-04-19 | Discharge: 2016-04-19 | Disposition: A | Discharge status: Home / Self Care | Payer: PPO | Source: Ambulatory Visit | Attending: Urology | Admitting: Urology

## 2016-04-19 DIAGNOSIS — C61 Malignant neoplasm of prostate: Secondary | ICD-10-CM | POA: Diagnosis not present

## 2016-04-19 MED ORDER — TECHNETIUM TC 99M MEDRONATE IV KIT
20.0000 | PACK | Freq: Once | INTRAVENOUS | Status: AC | PRN
  Administered 2016-04-19: 20 via INTRAVENOUS

## 2016-04-25 ENCOUNTER — Ambulatory Visit (INDEPENDENT_AMBULATORY_CARE_PROVIDER_SITE_OTHER): Payer: PPO | Admitting: Nurse Practitioner

## 2016-04-25 ENCOUNTER — Encounter (INDEPENDENT_AMBULATORY_CARE_PROVIDER_SITE_OTHER): Payer: Self-pay

## 2016-04-25 ENCOUNTER — Encounter: Payer: Self-pay | Admitting: Nurse Practitioner

## 2016-04-25 VITALS — BP 158/90 | HR 69 | Ht 72.0 in | Wt 269.0 lb

## 2016-04-25 DIAGNOSIS — M7989 Other specified soft tissue disorders: Secondary | ICD-10-CM | POA: Diagnosis not present

## 2016-04-25 DIAGNOSIS — I481 Persistent atrial fibrillation: Secondary | ICD-10-CM | POA: Diagnosis not present

## 2016-04-25 DIAGNOSIS — I4819 Other persistent atrial fibrillation: Secondary | ICD-10-CM

## 2016-04-25 DIAGNOSIS — R0602 Shortness of breath: Secondary | ICD-10-CM

## 2016-04-25 LAB — CBC
HCT: 31.6 % — ABNORMAL LOW (ref 38.5–50.0)
Hemoglobin: 9.3 g/dL — ABNORMAL LOW (ref 13.2–17.1)
MCH: 21.9 pg — ABNORMAL LOW (ref 27.0–33.0)
MCHC: 29.4 g/dL — ABNORMAL LOW (ref 32.0–36.0)
MCV: 74.4 fL — ABNORMAL LOW (ref 80.0–100.0)
MPV: 10.4 fL (ref 7.5–12.5)
Platelets: 244 10*3/uL (ref 140–400)
RBC: 4.25 MIL/uL (ref 4.20–5.80)
RDW: 18 % — ABNORMAL HIGH (ref 11.0–15.0)
WBC: 7 10*3/uL (ref 3.8–10.8)

## 2016-04-25 LAB — BASIC METABOLIC PANEL
BUN: 16 mg/dL (ref 7–25)
CO2: 24 mmol/L (ref 20–31)
Calcium: 9.3 mg/dL (ref 8.6–10.3)
Chloride: 102 mmol/L (ref 98–110)
Creat: 1.01 mg/dL (ref 0.70–1.11)
Glucose, Bld: 119 mg/dL — ABNORMAL HIGH (ref 65–99)
Potassium: 5.1 mmol/L (ref 3.5–5.3)
Sodium: 140 mmol/L (ref 135–146)

## 2016-04-25 NOTE — Progress Notes (Signed)
CARDIOLOGY OFFICE NOTE  Date:  04/25/2016    Bernard Ramirez Date of Birth: 1930/09/02 Medical Record [id number]  PCP:  Alonza Bogus, MD  Cardiologist:  Allred  Chief Complaint  Patient presents with  . Edema  . Shortness of Breath  . Atrial Fibrillation    2 week check - seen for Dr. [name]    History of Present Illness: Bernard Ramirez is a 82 y.o. male who presents today for a 2 week check. Seen for Dr. [name].   He has a history of longstanding persistent atrial fibrillation (has been in afib since at least 2013).  He also has a h/o gastritis/ peptic ulcers and is s/p prior billroth/ gastrectomy.  He had GI bleeding in September and had EGD which revealed chronic gastritis.  He is s/p prior stroke by EPIC history (though he does not recall this).  He was treated with eliquis but had to stop this due to anemia/ GI bleeding.  He has received IV iron in the past.   Seen here about 2 weeks ago by Dr. [name] - he complained of worsening edema.  He had not been wearing his support hose.  He had also not been taking his lasix due to urinary frequency.  He was getting ready to have TURP the following day after his visit here.  He snores heavily and has sleep apnea but has not been able to use CPAP.  Comes in today. Here with his wife. Hard to say if he is any better. Weight is down 4 pounds. Awaiting test results from a bone scan and CT - has stage 4 prostate cancer apparently - that will happen tomorrow. Still with some hematuria. Wife is able to get his support stockings on him. He likes salt - not really wanting to cut back. Breathing is stable as long as he paces himself.   Past Medical History:  Diagnosis Date  . Anemia    04/2012: H&H-10.2/31.5, MCV-77; 06/2012:12/38.9  . Arthritis   . Benign prostatic hypertrophy    s/p transurethral resection of the prostate  . Chest pain    2004; with dyspnea, stress nuclear-normal EF + questionable inferior wall ischemia, normal  coronary angiography;  2010-negative stress nuclear  . CVA (cerebral infarction)    asymptomatic (seen on CT)  . Diarrhea    h/o Hemoccult-positive stool  . Dysrhythmia   . Edema of both legs   . GERD (gastroesophageal reflux disease)   . History of blood transfusion   . History of kidney stones   . History of urinary tract infection   . Hypertension   . Jerking movements of extremities    left arm   . Low back pain   . Obstructive sleep apnea    pt states can not use the CPAP  . Peripheral neuropathy (HCC)    lower legs and feet bilat   . Persistent atrial fibrillation ([place]) 2013   Asymptomatic; diagnosed in 05/2012  . Pneumonia   . Shortness of breath dyspnea    pt states only develops SOB if tries to do something too quickly  . Upper GI bleed 1985   1985-peptic ulcer disease; initial hemigastrectomy; subsequent Billroth II  . Urinary hesitancy   . Vertigo    ED evaluation-06/2012    Past Surgical History:  Procedure Laterality Date  . Billroth II    . CHOLECYSTECTOMY    . COLONOSCOPY  Approximately 2000  . COLONOSCOPY  10/2012   Colonoscopy March  2014 at [facility], difficulty exam requiring fluoroscopy and overtube. Patient developed severe bradycardia again during his procedure like he did [facility]. Multiple polyps removed which were tubular adenomas. Previously tattooed sites at 20 and 30 cm were free of any polypoid change. Left-sided diverticulosis noted.  . COLONOSCOPY WITH ESOPHAGOGASTRODUODENOSCOPY (EGD)  06-17-2009   [id number] tubular adenomas and 2 tubulovillous adenomas, incomplete, 3 clips placed  . ESOPHAGOGASTRODUODENOSCOPY  06/17/2009   SLF: normal/chronic gastritis (non-h pylori)  . ESOPHAGOGASTRODUODENOSCOPY N/A 05/11/2015   [id number] anemai due to postgastrectomy state/moderate erosive gastritis  . INGUINAL HERNIA REPAIR     Left  . ROTATOR CUFF REPAIR  1991   Bilateral  . TRANSURETHRAL RESECTION OF PROSTATE    . TRANSURETHRAL RESECTION OF  PROSTATE N/A 12/27/2012   Procedure: TRANSURETHRAL RESECTION OF THE PROSTATE (TURP);  Surgeon: Marissa Nestle, MD;  Location: [facility];  Service: Urology;  Laterality: N/A;  . TRANSURETHRAL RESECTION OF PROSTATE N/A 04/11/2016   Procedure: TRANSURETHRAL RESECTION OF THE PROSTATE (TURP);  Surgeon: Irine Seal, MD;  Location: [facility];  Service: Urology;  Laterality: N/A;  . VAGOTOMY AND PYLOROPLASTY  1970s   Details of procedure uncertain; [id number]     Medications: Current Outpatient Prescriptions  Medication Sig Dispense Refill  . clobetasol cream (TEMOVATE) [id number] % Apply 1 application topically daily.    . diazepam (VALIUM) 5 MG tablet Take 1 tablet by mouth twice daily as needed for anxiety  2  . furosemide (LASIX) 40 MG tablet Take 40 mg by mouth daily AS DIRECTED 45 tablet 11  . HYDROcodone-acetaminophen (NORCO) 10-325 MG tablet TAKE 1 TABLET  FIVE TIMES DAILY AS NEEDED FOR PAIN  0  . losartan (COZAAR) 100 MG tablet Take 1 tablet by mouth daily  2  . meclizine (ANTIVERT) 25 MG tablet Take 25 mg by mouth 4 (four) times daily as needed for dizziness.    . nystatin (MYCOSTATIN/NYSTOP) 100000 UNIT/GM POWD Apply 1 g topically daily as needed (rash).     . pantoprazole (PROTONIX) 40 MG tablet Take 40 mg by mouth daily.    Marland Kitchen triamcinolone cream (KENALOG) 0.1 % Apply 1 application topically daily.      No current facility-administered medications for this visit.     Allergies: Allergies  Allergen Reactions  . Contrast Media [Iodinated Diagnostic Agents]     Pt states he was given "dye" 20 yrs ago, anaphylaxis & syncope - was told to never have it again   . Gabapentin Itching  . Aspirin Other (See Comments)    Blistering   . Latex Rash  . Tape Rash    EKG leads    Social History: The patient  reports that he quit smoking about 12 years ago. His smoking use included Cigars. He quit after 12.00 years of use. He has never used smokeless tobacco. He reports that he does not drink alcohol or use  drugs.   Family History: The patient's family history includes Allergic rhinitis in his father and mother; Diabetes Mellitus II in his mother; Lung disease in his father.   Review of Systems: Please see the history of present illness.   Otherwise, the review of systems is positive for none.   All other systems are reviewed and negative.   Physical Exam: VS:  BP (!) 158/90   Pulse 69   Ht 6' (1.829 m)   Wt 269 lb (122 kg)   SpO2 94% Comment: at rest  BMI 36.48 kg/m  .  BMI Body mass  index is 36.48 kg/m.  Wt Readings from Last 3 Encounters:  04/25/16 269 lb (122 kg)  04/11/16 273 lb (123.8 kg)  04/10/16 273 lb (123.8 kg)   Recheck  BP is 140/90 by me.   General: Pleasant. Elderly male. He is alert and in no acute distress. His weight is down 4 pounds.    HEENT: Normal.  Neck: Supple, no JVD, carotid bruits, or masses noted.  Cardiac: Irregular irregular rhythm. Rate is ok. He has 2+ bilateral edema.  Respiratory:  Lungs are fairly clear but with decreased breath sounds. GI: Soft and nontender.  MS: No deformity or atrophy. Gait and ROM intact.  Skin: Warm and dry. Color is normal.  Neuro:  Strength and sensation are intact and no gross focal deficits noted.  Psych: Alert, appropriate and with normal affect.   LABORATORY DATA:  EKG:  EKG is not ordered today.  Lab Results  Component Value Date   WBC 5.9 04/07/2016   HGB 9.1 (L) 04/07/2016   HCT 31.4 (L) 04/07/2016   PLT 188 04/07/2016   GLUCOSE 125 (H) 04/13/2016   CHOL  05/18/2009    179        ATP III CLASSIFICATION:  <200     mg/dL   Desirable  200-239  mg/dL   Borderline High  >=240    mg/dL   High          TRIG 189 (H) 05/18/2009   HDL 34 (L) 05/18/2009   LDLCALC (H) 05/18/2009    107        Total Cholesterol/HDL:CHD Risk Coronary Heart Disease Risk Table                     Men   Women  1/2 Average Risk   3.4   3.3  Average Risk       5.0   4.4  2 X Average Risk   9.6   7.1  3 X Average Risk  23.4    11.0        Use the calculated Patient Ratio above and the CHD Risk Table to determine the patient's CHD Risk.        ATP III CLASSIFICATION (LDL):  <100     mg/dL   Optimal  100-129  mg/dL   Near or Above                    Optimal  130-159  mg/dL   Borderline  160-189  mg/dL   High  >190     mg/dL   Very High   ALT 12 06/24/2012   AST 21 06/24/2012   NA 137 04/13/2016   K 3.9 04/13/2016   CL 103 04/13/2016   CREATININE 1.01 04/13/2016   BUN 16 04/13/2016   CO2 27 04/13/2016   TSH 2.995 Test methodology is 3rd generation TSH 06/17/2009   INR 1.19 05/03/2015    BNP (last 3 results) No results for input(s): BNP in the last 8760 hours.  ProBNP (last 3 results) No results for input(s): PROBNP in the last 8760 hours.   Other Studies Reviewed Today: Myoview Study Highlights from 09/2015    Nuclear stress EF: 55%.  There was no ST segment deviation noted during stress.  There is a small defect of mild severity present in the basal inferior and basal inferolateral location. The defect is non-reversible and consistent with diaphragmatic attenuation and extra cardiac activity. No ischemia noted.  This is a  low risk study.  The left ventricular ejection fraction is normal (55-65%).   Echo Study Conclusions from 09/2015  - Left ventricle: The cavity size was normal. Wall thickness was   increased in a pattern of moderate LVH. Systolic function was   normal. The estimated ejection fraction was in the range of 60%   to 65%. Wall motion was normal; there were no regional wall   motion abnormalities. The study is not technically sufficient to   allow evaluation of LV diastolic function. - Mitral valve: Mildly thickened leaflets . There was mild to   moderate regurgitation. - Right ventricle: The cavity size was moderately dilated. Reduced   systolic function. - Right atrium: Moderately dilated at 24 cm2. - Tricuspid valve: There was moderate regurgitation. - Pulmonary  arteries: Poorly visualized. PA peak pressure: 83 mm Hg   (S). - Inferior vena cava: The vessel was dilated. The respirophasic   diameter changes were blunted (< 50%), consistent with elevated   central venous pressure.  Impressions:  - Compared to the prior study in 2015, the EF has improved to   60-65%. There is now severe pulmonary hypertension with RVSP of   83 mmHg (up from 51 mmHg). The RV is moderately dilated with   reduced systolic function, there is moderate TR and moderate RAE,   a-fib was present, there is mild to moderate MR, the LA is   moderately dilated.  Assessment/Plan: 1.  Longstanding persistent atrial fibrillation - he is managed with rate control.  CHADSVASC score is at least 5.  Unfortunately, he has had ongoing difficulty with bleeding and anemia and continues to be a poor candidate for long term anticoagulation. Left atrial appendage occlusive therapy (WATCHMAN) was discussed with him by Dr. [name] at last visit - however the patient does not feel that he could take short term anticoagulation and also has an allergy to ASA.  Dr. [name] is reluctant to consider this therapy for his going forward.  2. SOB Previous echo and myoview were low risk from [place] PFTs revealed mild interestitial process, possibly age related No further workup planned.  3. HTN - BP just fair.   4. OSA - He is not compliant with therapy  5.  Edema - felt to be most likely multifactoral - He is noncompliant with lasix and support hose.  Does not really see the need to restrict his salt.   6. Prostate cancer - bone scan with some abnormality - this was NOT discussed at our visit today.   Current medicines are reviewed with the patient today.  The patient does not have concerns regarding medicines other than what has been noted above.  The following changes have been made:  See above.  Labs/ tests ordered today include:    Orders Placed This Encounter  Procedures  . Basic  metabolic panel  . CBC     Disposition:   FU with Dr. [name] as planned.    Patient is agreeable to this plan and will call if any problems develop in the interim.   Signed: Burtis Junes, RN, ANP-C 04/25/2016 3:23 PM  [facility] [address] [place], [place]  [address] Phone: [phone] Fax: [phone]

## 2016-04-25 NOTE — Patient Instructions (Addendum)
We will be checking the following labs today - BMET and CBC   Medication Instructions:    Continue with your current medicines.     Testing/Procedures To Be Arranged:  N/A  Follow-Up:   See Dr. [name] as planned.    Other Special Instructions:   Try to limit your salt  Keep wearing your support stockings    If you need a refill on your cardiac medications before your next appointment, please call your pharmacy.   Call the [place] office at [phone] if you have any questions, problems or concerns.

## 2016-04-26 DIAGNOSIS — C61 Malignant neoplasm of prostate: Secondary | ICD-10-CM | POA: Diagnosis not present

## 2016-04-26 DIAGNOSIS — E291 Testicular hypofunction: Secondary | ICD-10-CM | POA: Diagnosis not present

## 2016-04-26 DIAGNOSIS — R310 Gross hematuria: Secondary | ICD-10-CM | POA: Diagnosis not present

## 2016-05-04 ENCOUNTER — Telehealth: Payer: Self-pay | Admitting: Internal Medicine

## 2016-05-04 NOTE — Telephone Encounter (Signed)
Has prostate cancer and Dr. Roni Bread wants to give him an injection she will find out the name and bring with them to his appointment on 05/22/16.  They are worried as they were told the injection can damage his heart.

## 2016-05-04 NOTE — Telephone Encounter (Signed)
New Message  Pts wife voiced pt has cancer and wants to know if it's alright for pt to see MD scheduled for tomorrow.  Please f/u with pt/pts wife.

## 2016-05-22 ENCOUNTER — Encounter (INDEPENDENT_AMBULATORY_CARE_PROVIDER_SITE_OTHER): Payer: Self-pay

## 2016-05-22 ENCOUNTER — Ambulatory Visit (INDEPENDENT_AMBULATORY_CARE_PROVIDER_SITE_OTHER): Payer: PPO | Admitting: Internal Medicine

## 2016-05-22 ENCOUNTER — Encounter: Payer: Self-pay | Admitting: Internal Medicine

## 2016-05-22 VITALS — BP 122/60 | HR 58 | Ht 72.0 in | Wt 250.2 lb

## 2016-05-22 DIAGNOSIS — I481 Persistent atrial fibrillation: Secondary | ICD-10-CM

## 2016-05-22 DIAGNOSIS — I4819 Other persistent atrial fibrillation: Secondary | ICD-10-CM

## 2016-05-22 DIAGNOSIS — R0602 Shortness of breath: Secondary | ICD-10-CM | POA: Diagnosis not present

## 2016-05-22 DIAGNOSIS — G4733 Obstructive sleep apnea (adult) (pediatric): Secondary | ICD-10-CM

## 2016-05-22 DIAGNOSIS — M7989 Other specified soft tissue disorders: Secondary | ICD-10-CM

## 2016-05-22 DIAGNOSIS — I10 Essential (primary) hypertension: Secondary | ICD-10-CM

## 2016-05-22 NOTE — Progress Notes (Signed)
Electrophysiology Office Note   Date:  05/22/2016   ID:  [name], DOB 04-17-1931, MRN [id number]  PCP:  Alonza Bogus, MD  Cardiologist:  Previously Dr Lattie Haw (last seen in 2013) Primary Electrophysiologist: Thompson Grayer, MD    Chief Complaint  Patient presents with  . Atrial Fibrillation     History of Present Illness: Bernard Ramirez is a 82 y.o. male who presents today for electrophysiology follow-up.   The patient has longstanding persistent atrial fibrillation (has been in afib since at least 2013).   He has recovered from TURP.  His edema is slightly improved.  Today, he denies symptoms of palpitations, chest pain, orthopnea, PND,  claudication, dizziness, presyncope, or syncope. The patient is tolerating medications without difficulties and is otherwise without complaint today.    Past Medical History:  Diagnosis Date  . Anemia    04/2012: H&H-10.2/31.5, MCV-77; 06/2012:12/38.9  . Arthritis   . Benign prostatic hypertrophy    s/p transurethral resection of the prostate  . Chest pain    2004; with dyspnea, stress nuclear-normal EF + questionable inferior wall ischemia, normal coronary angiography;  2010-negative stress nuclear  . CVA (cerebral infarction)    asymptomatic (seen on CT)  . Diarrhea    h/o Hemoccult-positive stool  . Dysrhythmia   . Edema of both legs   . GERD (gastroesophageal reflux disease)   . History of blood transfusion   . History of kidney stones   . History of urinary tract infection   . Hypertension   . Jerking movements of extremities    left arm   . Low back pain   . Obstructive sleep apnea    pt states can not use the CPAP  . Peripheral neuropathy (HCC)    lower legs and feet bilat   . Persistent atrial fibrillation ([place]) 2013   Asymptomatic; diagnosed in 05/2012  . Pneumonia   . Shortness of breath dyspnea    pt states only develops SOB if tries to do something too quickly  . Upper GI bleed 1985   1985-peptic ulcer  disease; initial hemigastrectomy; subsequent Billroth II  . Urinary hesitancy   . Vertigo    ED evaluation-06/2012   Past Surgical History:  Procedure Laterality Date  . Billroth II    . CHOLECYSTECTOMY    . COLONOSCOPY  Approximately 2000  . COLONOSCOPY  10/2012   Colonoscopy March 2014 at [facility], difficulty exam requiring fluoroscopy and overtube. Patient developed severe bradycardia again during his procedure like he did [facility]. Multiple polyps removed which were tubular adenomas. Previously tattooed sites at 20 and 30 cm were free of any polypoid change. Left-sided diverticulosis noted.  . COLONOSCOPY WITH ESOPHAGOGASTRODUODENOSCOPY (EGD)  06-17-2009   NOB:SJGGEZMO tubular adenomas and 2 tubulovillous adenomas, incomplete, 3 clips placed  . ESOPHAGOGASTRODUODENOSCOPY  06/17/2009   SLF: normal/chronic gastritis (non-h pylori)  . ESOPHAGOGASTRODUODENOSCOPY N/A 05/11/2015   QHU:TMLYYTK anemai due to postgastrectomy state/moderate erosive gastritis  . INGUINAL HERNIA REPAIR     Left  . ROTATOR CUFF REPAIR  1991   Bilateral  . TRANSURETHRAL RESECTION OF PROSTATE    . TRANSURETHRAL RESECTION OF PROSTATE N/A 12/27/2012   Procedure: TRANSURETHRAL RESECTION OF THE PROSTATE (TURP);  Surgeon: Marissa Nestle, MD;  Location: [facility];  Service: Urology;  Laterality: N/A;  . TRANSURETHRAL RESECTION OF PROSTATE N/A 04/11/2016   Procedure: TRANSURETHRAL RESECTION OF THE PROSTATE (TURP);  Surgeon: Irine Seal, MD;  Location: [facility];  Service: Urology;  Laterality:  N/A;  . VAGOTOMY AND PYLOROPLASTY  1970s   Details of procedure uncertain; 2505L     Current Outpatient Prescriptions  Medication Sig Dispense Refill  . clobetasol cream (TEMOVATE) 9.76 % Apply 1 application topically daily.    . diazepam (VALIUM) 5 MG tablet Take 1 tablet by mouth twice daily as needed for anxiety  2  . furosemide (LASIX) 40 MG tablet Take 40 mg by mouth daily AS DIRECTED 45 tablet 11  .  HYDROcodone-acetaminophen (NORCO) 10-325 MG tablet TAKE 1 TABLET  FIVE TIMES DAILY AS NEEDED FOR PAIN  0  . losartan (COZAAR) 100 MG tablet Take 1 tablet by mouth daily  2  . meclizine (ANTIVERT) 25 MG tablet Take 25 mg by mouth 4 (four) times daily as needed for dizziness.    . nystatin (MYCOSTATIN/NYSTOP) 100000 UNIT/GM POWD Apply 1 g topically daily as needed (rash).     . pantoprazole (PROTONIX) 40 MG tablet Take 40 mg by mouth daily.    Marland Kitchen triamcinolone cream (KENALOG) 0.1 % Apply 1 application topically daily.      No current facility-administered medications for this visit.     Allergies:   Contrast media [iodinated diagnostic agents]; Gabapentin; Aspirin; Latex; and Tape   Social History:  The patient  reports that he quit smoking about 12 years ago. His smoking use included Cigars. He quit after 12.00 years of use. He has never used smokeless tobacco. He reports that he does not drink alcohol or use drugs.   Family History:  The patient's  family history includes Allergic rhinitis in his father and mother; Diabetes Mellitus II in his mother; Lung disease in his father.    ROS:  Please see the history of present illness.   All other systems are reviewed and negative.    PHYSICAL EXAM: VS:  BP 122/60   Pulse (!) 58   Ht 6' (1.829 m)   Wt 250 lb 3.2 oz (113.5 kg)   BMI 33.93 kg/m  , BMI Body mass index is 33.93 kg/m. GEN: overweight, in no acute distress  HEENT: normal  Neck: no JVD, carotid bruits, or masses Cardiac: iRRR; no murmurs, rubs, or gallops,+ 2 edema  Respiratory:  clear to auscultation bilaterally, normal work of breathing GI: soft, nontender, nondistended, + BS MS: no deformity or atrophy  Skin: warm and dry  Neuro:  Strength and sensation are intact Psych: euthymic mood, full affect  Recent Labs: 04/25/2016: BUN 16; Creat 1.01; Hemoglobin 9.3; Platelets 244; Potassium 5.1; Sodium 140    Lipid Panel     Component Value Date/Time   CHOL  05/18/2009 0525      179        ATP III CLASSIFICATION:  <200     mg/dL   Desirable  200-239  mg/dL   Borderline High  >=240    mg/dL   High          TRIG 189 (H) 05/18/2009 0525   HDL 34 (L) 05/18/2009 0525   CHOLHDL 5.3 05/18/2009 0525   VLDL 38 05/18/2009 0525   LDLCALC (H) 05/18/2009 0525    107        Total Cholesterol/HDL:CHD Risk Coronary Heart Disease Risk Table                     Men   Women  1/2 Average Risk   3.4   3.3  Average Risk       5.0   4.4  2  X Average Risk   9.6   7.1  3 X Average Risk  23.4   11.0        Use the calculated Patient Ratio above and the CHD Risk Table to determine the patient's CHD Risk.        ATP III CLASSIFICATION (LDL):  <100     mg/dL   Optimal  100-129  mg/dL   Near or Above                    Optimal  130-159  mg/dL   Borderline  160-189  mg/dL   High  >190     mg/dL   Very High     Wt Readings from Last 3 Encounters:  05/22/16 250 lb 3.2 oz (113.5 kg)  04/25/16 269 lb (122 kg)  04/11/16 273 lb (123.8 kg)      ASSESSMENT AND PLAN:  1.  Longstanding persistent atrial fibrillation Continue rate control long term. chads2vasc score is at least 5.  Unfortunately, he has ongoing difficulty with bleeding and anemia and is felt to be a poor candidate for long term anticoagulation.  We did discuss left atrial appendage occlusive therapy (WATCHMAN) as an alternative.  He does not feel that he could take short term anticoagulation and also has an allergy to ASA.  I am therefore reluctant to consider this therapy for his going forward. No changes today  2. SOB Previous echo and myoview were low risk PFTs revealed mild interestitial process, possibly age related No further workup planned.  3.  HTN Stable No change required today  4. OSA He is not compliant with therapy  5. Edema Likely multifactoral He is noncompliant with lasix and support hose. I have encouraged compliance with these.  2 gram sodium diet  Return to see Cecille Rubin in 3  months I will see in 6 months  Signed, Thompson Grayer, MD  05/22/2016 9:15 PM     [address] [place] [address] [phone] (office) [phone] (fax)

## 2016-05-22 NOTE — Patient Instructions (Signed)
Medication Instructions:  Your physician recommends that you continue on your current medications as directed. Please refer to the Current Medication list given to you today.   Labwork: None ordered   Testing/Procedures: None ordered   Follow-Up: Your physician recommends that you schedule a follow-up appointment in: 3 months with Truitt Merle, NP and 6 months with Dr [name]   Any Other Special Instructions Will Be Listed Below (If Applicable).    Low-Sodium Eating Plan Sodium raises blood pressure and causes water to be held in the body. Getting less sodium from food will help lower your blood pressure, reduce any swelling, and protect your heart, liver, and kidneys. We get sodium by adding salt (sodium chloride) to food. Most of our sodium comes from canned, boxed, and frozen foods. Restaurant foods, fast foods, and pizza are also very high in sodium. Even if you take medicine to lower your blood pressure or to reduce fluid in your body, getting less sodium from your food is important. WHAT IS MY PLAN? Most people should limit their sodium intake to 2,300 mg a day. Your health care provider recommends that you limit your sodium intake to 2 grams  a day.  WHAT DO I NEED TO KNOW ABOUT THIS EATING PLAN? For the low-sodium eating plan, you will follow these general guidelines:  Choose foods with a % Daily Value for sodium of less than 5% (as listed on the food label).   Use salt-free seasonings or herbs instead of table salt or sea salt.   Check with your health care provider or pharmacist before using salt substitutes.   Eat fresh foods.  Eat more vegetables and fruits.  Limit canned vegetables. If you do use them, rinse them well to decrease the sodium.   Limit cheese to 1 oz (28 g) per day.   Eat lower-sodium products, often labeled as "lower sodium" or "no salt added."  Avoid foods that contain monosodium glutamate (MSG). MSG is sometimes added to [place] food and  some canned foods.  Check food labels (Nutrition Facts labels) on foods to learn how much sodium is in one serving.  Eat more home-cooked food and less restaurant, buffet, and fast food.  When eating at a restaurant, ask that your food be prepared with less salt, or no salt if possible.  HOW DO I READ FOOD LABELS FOR SODIUM INFORMATION? The Nutrition Facts label lists the amount of sodium in one serving of the food. If you eat more than one serving, you must multiply the listed amount of sodium by the number of servings. Food labels may also identify foods as:  Sodium free--Less than 5 mg in a serving.  Very low sodium--35 mg or less in a serving.  Low sodium--140 mg or less in a serving.  Light in sodium--50% less sodium in a serving. For example, if a food that usually has 300 mg of sodium is changed to become light in sodium, it will have 150 mg of sodium.  Reduced sodium--25% less sodium in a serving. For example, if a food that usually has 400 mg of sodium is changed to reduced sodium, it will have 300 mg of sodium. WHAT FOODS CAN I EAT? Grains Low-sodium cereals, including oats, puffed wheat and rice, and shredded wheat cereals. Low-sodium crackers. Unsalted rice and pasta. Lower-sodium bread.  Vegetables Frozen or fresh vegetables. Low-sodium or reduced-sodium canned vegetables. Low-sodium or reduced-sodium tomato sauce and paste. Low-sodium or reduced-sodium tomato and vegetable juices.  Fruits Fresh, frozen, and canned  fruit. Fruit juice.  Meat and Other Protein Products Low-sodium canned tuna and salmon. Fresh or frozen meat, poultry, seafood, and fish. Lamb. Unsalted nuts. Dried beans, peas, and lentils without added salt. Unsalted canned beans. Homemade soups without salt. Eggs.  Dairy Milk. Soy milk. Ricotta cheese. Low-sodium or reduced-sodium cheeses. Yogurt.  Condiments Fresh and dried herbs and spices. Salt-free seasonings. Onion and garlic powders.  Low-sodium varieties of mustard and ketchup. Fresh or refrigerated horseradish. Lemon juice.  Fats and Oils Reduced-sodium salad dressings. Unsalted butter.  Other Unsalted popcorn and pretzels.  The items listed above may not be a complete list of recommended foods or beverages. Contact your dietitian for more options. WHAT FOODS ARE NOT RECOMMENDED? Grains Instant hot cereals. Bread stuffing, pancake, and biscuit mixes. Croutons. Seasoned rice or pasta mixes. Noodle soup cups. Boxed or frozen macaroni and cheese. Self-rising flour. Regular salted crackers. Vegetables Regular canned vegetables. Regular canned tomato sauce and paste. Regular tomato and vegetable juices. Frozen vegetables in sauces. Salted [place] fries. Olives. Angie Fava. Relishes. Sauerkraut. Salsa. Meat and Other Protein Products Salted, canned, smoked, spiced, or pickled meats, seafood, or fish. Bacon, ham, sausage, hot dogs, corned beef, chipped beef, and packaged luncheon meats. Salt pork. Jerky. Pickled herring. Anchovies, regular canned tuna, and sardines. Salted nuts. Dairy Processed cheese and cheese spreads. Cheese curds. Blue cheese and cottage cheese. Buttermilk.  Condiments Onion and garlic salt, seasoned salt, table salt, and sea salt. Canned and packaged gravies. Worcestershire sauce. Tartar sauce. Barbecue sauce. Teriyaki sauce. Soy sauce, including reduced sodium. Steak sauce. Fish sauce. Oyster sauce. Cocktail sauce. Horseradish that you find on the shelf. Regular ketchup and mustard. Meat flavorings and tenderizers. Bouillon cubes. Hot sauce. Tabasco sauce. Marinades. Taco seasonings. Relishes. Fats and Oils Regular salad dressings. Salted butter. Margarine. Ghee. Bacon fat.  Other Potato and tortilla chips. Corn chips and puffs. Salted popcorn and pretzels. Canned or dried soups. Pizza. Frozen entrees and pot pies.  The items listed above may not be a complete list of foods and beverages to avoid.  Contact your dietitian for more information.   This information is not intended to replace advice given to you by your health care provider. Make sure you discuss any questions you have with your health care provider.   Document Released: 01/27/2002 Document Revised: 08/28/2014 Document Reviewed: 06/11/2013 Elsevier Interactive Patient Education [employer].

## 2016-05-23 DIAGNOSIS — Z23 Encounter for immunization: Secondary | ICD-10-CM | POA: Diagnosis not present

## 2016-05-30 ENCOUNTER — Ambulatory Visit (INDEPENDENT_AMBULATORY_CARE_PROVIDER_SITE_OTHER): Payer: PPO | Admitting: Urology

## 2016-05-30 DIAGNOSIS — C61 Malignant neoplasm of prostate: Secondary | ICD-10-CM | POA: Diagnosis not present

## 2016-06-06 DIAGNOSIS — L259 Unspecified contact dermatitis, unspecified cause: Secondary | ICD-10-CM | POA: Diagnosis not present

## 2016-06-06 DIAGNOSIS — L570 Actinic keratosis: Secondary | ICD-10-CM | POA: Diagnosis not present

## 2016-06-21 HISTORY — PX: TRANSURETHRAL RESECTION OF PROSTATE: SHX73

## 2016-07-04 DIAGNOSIS — N401 Enlarged prostate with lower urinary tract symptoms: Secondary | ICD-10-CM | POA: Diagnosis not present

## 2016-07-04 DIAGNOSIS — I10 Essential (primary) hypertension: Secondary | ICD-10-CM | POA: Diagnosis not present

## 2016-07-04 DIAGNOSIS — M545 Low back pain: Secondary | ICD-10-CM | POA: Diagnosis not present

## 2016-07-04 DIAGNOSIS — Z23 Encounter for immunization: Secondary | ICD-10-CM | POA: Diagnosis not present

## 2016-07-04 DIAGNOSIS — I482 Chronic atrial fibrillation: Secondary | ICD-10-CM | POA: Diagnosis not present

## 2016-07-07 ENCOUNTER — Ambulatory Visit (INDEPENDENT_AMBULATORY_CARE_PROVIDER_SITE_OTHER): Payer: PPO | Admitting: Urology

## 2016-07-07 DIAGNOSIS — C61 Malignant neoplasm of prostate: Secondary | ICD-10-CM | POA: Diagnosis not present

## 2016-07-07 DIAGNOSIS — N5201 Erectile dysfunction due to arterial insufficiency: Secondary | ICD-10-CM | POA: Diagnosis not present

## 2016-07-07 DIAGNOSIS — E291 Testicular hypofunction: Secondary | ICD-10-CM

## 2016-08-22 ENCOUNTER — Ambulatory Visit (INDEPENDENT_AMBULATORY_CARE_PROVIDER_SITE_OTHER): Payer: PPO | Admitting: Nurse Practitioner

## 2016-08-22 ENCOUNTER — Encounter: Payer: Self-pay | Admitting: Nurse Practitioner

## 2016-08-22 ENCOUNTER — Encounter (INDEPENDENT_AMBULATORY_CARE_PROVIDER_SITE_OTHER): Payer: Self-pay

## 2016-08-22 VITALS — BP 140/88 | HR 66 | Ht 72.0 in | Wt 262.1 lb

## 2016-08-22 DIAGNOSIS — G4733 Obstructive sleep apnea (adult) (pediatric): Secondary | ICD-10-CM | POA: Diagnosis not present

## 2016-08-22 DIAGNOSIS — R0602 Shortness of breath: Secondary | ICD-10-CM

## 2016-08-22 DIAGNOSIS — I4819 Other persistent atrial fibrillation: Secondary | ICD-10-CM

## 2016-08-22 DIAGNOSIS — I481 Persistent atrial fibrillation: Secondary | ICD-10-CM | POA: Diagnosis not present

## 2016-08-22 DIAGNOSIS — I10 Essential (primary) hypertension: Secondary | ICD-10-CM

## 2016-08-22 NOTE — Progress Notes (Signed)
CARDIOLOGY OFFICE NOTE  Date:  08/22/2016    Bernard Ramirez Date of Birth: Dec 09, 1930 Medical Record #[id number]  PCP:  Alonza Bogus, MD  Cardiologist:  Allred  Chief Complaint  Patient presents with  . Atrial Fibrillation    Follow up visit - seen for Dr. [name]    History of Present Illness: Bernard Ramirez is a 83 y.o. male who presents today for a follow up visit.  Seen for Dr. [name].   He has a history of longstanding persistent atrial fibrillation (has been in afib since at least 2013). He also has a h/o gastritis/ peptic ulcers and is s/p prior billroth/ gastrectomy. He had GI bleeding in September and had EGD which revealed chronic gastritis. He is s/p prior stroke by EPIC history (though he does not recall this). He was treated with eliquis but had to stop this due to anemia/ GI bleeding. He has received IV iron in the past. He has had prior strokes noted on MR of the brain back in February of 2017.   Seen back in August of 2017 by Dr. [name] - he complained of worsening edema. He had not been wearing his support hose. He had also not been taking his lasix due to urinary frequency. He was getting ready to have TURP the following day after his visit here. He snores heavily and has sleep apnea but has not been able to use CPAP.  I then saw him in follow up. Noted to have been diagnosed with stage 4 prostate cancer. Cardiac status seemed ok.   Last seen here back in October by Dr. [name] - felt to be a poor candidate for any type of anticoagulation as well as Watchman. Dr. [name] did not feel inclined to change his regimen. Last echo from 09/2015 with progressive pulmonary HTN. Low risk Myoview noted.   Comes in today. Here with his wife today. He says he does not really know why he is here.  I have received records today from [facility] - notes that androgen deprivation therapy [facility]ler) has been recommended by [facility] but he has not wished to proceed.   He has chronic  itching - got put on Lipitor since the stroke - they feel it has made the itching worse. They have stopped the Lipitor and seems better. They will talk to Dr. Luan Pulling to see what to do next in this regards. He is still not sure about taking any therapy for his prostate cancer. Sounds like he has lots of GU/GI side effects from his prior surgery. He says he is taking his fluid pills. Sounds like he still gets too much salt. He admits that he likes his salt. Says his swelling is better. Pretty sedentary. Labs are followed by PCP. He seems like he is basically at his baseline and content with how he is doing.   Past Medical History:  Diagnosis Date  . Anemia    04/2012: H&H-10.2/31.5, MCV-77; 06/2012:12/38.9  . Arthritis   . Benign prostatic hypertrophy    s/p transurethral resection of the prostate  . Chest pain    2004; with dyspnea, stress nuclear-normal EF + questionable inferior wall ischemia, normal coronary angiography;  2010-negative stress nuclear  . CVA (cerebral infarction)    asymptomatic (seen on CT)  . Diarrhea    h/o Hemoccult-positive stool  . Dysrhythmia   . Edema of both legs   . GERD (gastroesophageal reflux disease)   . History of blood transfusion   .  History of kidney stones   . History of urinary tract infection   . Hypertension   . Jerking movements of extremities    left arm   . Low back pain   . Obstructive sleep apnea    pt states can not use the CPAP  . Peripheral neuropathy (HCC)    lower legs and feet bilat   . Persistent atrial fibrillation ([facility]) 2013   Asymptomatic; diagnosed in 05/2012  . Pneumonia   . Shortness of breath dyspnea    pt states only develops SOB if tries to do something too quickly  . Upper GI bleed 1985   1985-peptic ulcer disease; initial hemigastrectomy; subsequent Billroth II  . Urinary hesitancy   . Vertigo    ED evaluation-06/2012    Past Surgical History:  Procedure Laterality Date  . Billroth II    . CHOLECYSTECTOMY    .  COLONOSCOPY  Approximately 2000  . COLONOSCOPY  10/2012   Colonoscopy March 2014 at [facility], difficulty exam requiring fluoroscopy and overtube. Patient developed severe bradycardia again during his procedure like he did [facility]. Multiple polyps removed which were tubular adenomas. Previously tattooed sites at 20 and 30 cm were free of any polypoid change. Left-sided diverticulosis noted.  . COLONOSCOPY WITH ESOPHAGOGASTRODUODENOSCOPY (EGD)  06-17-2009   GGY:IRSWNIOE tubular adenomas and 2 tubulovillous adenomas, incomplete, 3 clips placed  . ESOPHAGOGASTRODUODENOSCOPY  06/17/2009   SLF: normal/chronic gastritis (non-h pylori)  . ESOPHAGOGASTRODUODENOSCOPY N/A 05/11/2015   VOJ:JKKXFGH anemai due to postgastrectomy state/moderate erosive gastritis  . INGUINAL HERNIA REPAIR     Left  . ROTATOR CUFF REPAIR  1991   Bilateral  . TRANSURETHRAL RESECTION OF PROSTATE    . TRANSURETHRAL RESECTION OF PROSTATE N/A 12/27/2012   Procedure: TRANSURETHRAL RESECTION OF THE PROSTATE (TURP);  Surgeon: Marissa Nestle, MD;  Location: [facility];  Service: Urology;  Laterality: N/A;  . TRANSURETHRAL RESECTION OF PROSTATE N/A 04/11/2016   Procedure: TRANSURETHRAL RESECTION OF THE PROSTATE (TURP);  Surgeon: Irine Seal, MD;  Location: [facility];  Service: Urology;  Laterality: N/A;  . VAGOTOMY AND PYLOROPLASTY  1970s   Details of procedure uncertain; 8299B     Medications: Current Outpatient Prescriptions  Medication Sig Dispense Refill  . atorvastatin (LIPITOR) 80 MG tablet Take 80 mg by mouth daily.    . clobetasol cream (TEMOVATE) 7.16 % Apply 1 application topically daily.    . diazepam (VALIUM) 5 MG tablet Take 1 tablet by mouth twice daily as needed for anxiety  2  . furosemide (LASIX) 40 MG tablet Take 40 mg by mouth daily AS DIRECTED 45 tablet 11  . HYDROcodone-acetaminophen (NORCO) 10-325 MG tablet TAKE 1 TABLET  FIVE TIMES DAILY AS NEEDED FOR PAIN  0  . losartan (COZAAR) 100 MG tablet Take 1 tablet  by mouth daily  2  . meclizine (ANTIVERT) 25 MG tablet Take 25 mg by mouth 4 (four) times daily as needed for dizziness.    . nystatin (MYCOSTATIN/NYSTOP) 100000 UNIT/GM POWD Apply 1 g topically daily as needed (rash).     . pantoprazole (PROTONIX) 40 MG tablet Take 40 mg by mouth daily.    Marland Kitchen triamcinolone cream (KENALOG) 0.1 % Apply 1 application topically daily.      No current facility-administered medications for this visit.     Allergies: Allergies  Allergen Reactions  . Contrast Media [Iodinated Diagnostic Agents]     Pt states he was given "dye" 20 yrs ago, anaphylaxis & syncope - was told  to never have it again   . Gabapentin Itching  . Aspirin Other (See Comments)    Blistering   . Latex Rash  . Tape Rash    EKG leads    Social History: The patient  reports that he quit smoking about 13 years ago. His smoking use included Cigars. He quit after 12.00 years of use. He has never used smokeless tobacco. He reports that he does not drink alcohol or use drugs.   Family History: The patient's family history includes Allergic rhinitis in his father and mother; Diabetes Mellitus II in his mother; Lung disease in his father.   Review of Systems: Please see the history of present illness.   Otherwise, the review of systems is positive for none.   All other systems are reviewed and negative.   Physical Exam: VS:  BP 140/88   Pulse 66   Ht 6' (1.829 m)   Wt 262 lb 1.9 oz (118.9 kg)   BMI 35.55 kg/m  .  BMI Body mass index is 35.55 kg/m.  Wt Readings from Last 3 Encounters:  08/22/16 262 lb 1.9 oz (118.9 kg)  05/22/16 250 lb 3.2 oz (113.5 kg)  04/25/16 269 lb (122 kg)    General: Pleasant. Obese. He is alert and in no acute distress.   HEENT: Normal.  Neck: Supple, no JVD, carotid bruits, or masses noted.  Cardiac: Irregular irregular rhythm. Rate is ok. No murmurs, rubs, or gallops. Trace edema.  Respiratory:  Lungs are clear to auscultation bilaterally with normal work  of breathing.  GI: Soft and nontender.  MS: No deformity or atrophy. Gait and ROM intact.  Skin: Warm and dry. Color is normal.  Neuro:  Strength and sensation are intact and no gross focal deficits noted.  Psych: Alert, appropriate and with normal affect.   LABORATORY DATA:  EKG:  EKG is not ordered today.  Lab Results  Component Value Date   WBC 7.0 04/25/2016   HGB 9.3 (L) 04/25/2016   HCT 31.6 (L) 04/25/2016   PLT 244 04/25/2016   GLUCOSE 119 (H) 04/25/2016   CHOL  05/18/2009    179        ATP III CLASSIFICATION:  <200     mg/dL   Desirable  200-239  mg/dL   Borderline High  >=240    mg/dL   High          TRIG 189 (H) 05/18/2009   HDL 34 (L) 05/18/2009   LDLCALC (H) 05/18/2009    107        Total Cholesterol/HDL:CHD Risk Coronary Heart Disease Risk Table                     Men   Women  1/2 Average Risk   3.4   3.3  Average Risk       5.0   4.4  2 X Average Risk   9.6   7.1  3 X Average Risk  23.4   11.0        Use the calculated Patient Ratio above and the CHD Risk Table to determine the patient's CHD Risk.        ATP III CLASSIFICATION (LDL):  <100     mg/dL   Optimal  100-129  mg/dL   Near or Above                    Optimal  130-159  mg/dL   Borderline  160-189  mg/dL   High  >190     mg/dL   Very High   ALT 12 06/24/2012   AST 21 06/24/2012   NA 140 04/25/2016   K 5.1 04/25/2016   CL 102 04/25/2016   CREATININE 1.01 04/25/2016   BUN 16 04/25/2016   CO2 24 04/25/2016   TSH 2.995 Test methodology is 3rd generation TSH 06/17/2009   INR 1.19 05/03/2015    BNP (last 3 results) No results for input(s): BNP in the last 8760 hours.  ProBNP (last 3 results) No results for input(s): PROBNP in the last 8760 hours.   Other Studies Reviewed Today:  Myoview Study Highlights from 09/2015   Nuclear stress EF: 55%.  There was no ST segment deviation noted during stress.  There is a small defect of mild severity present in the basal inferior and  basal inferolateral location. The defect is non-reversible and consistent with diaphragmatic attenuation and extra cardiac activity. No ischemia noted.  This is a low risk study.  The left ventricular ejection fraction is normal (55-65%).     Echo Study Conclusions from 09/2015  - Left ventricle: The cavity size was normal. Wall thickness was   increased in a pattern of moderate LVH. Systolic function was   normal. The estimated ejection fraction was in the range of 60%   to 65%. Wall motion was normal; there were no regional wall   motion abnormalities. The study is not technically sufficient to   allow evaluation of LV diastolic function. - Mitral valve: Mildly thickened leaflets . There was mild to   moderate regurgitation. - Right ventricle: The cavity size was moderately dilated. Reduced   systolic function. - Right atrium: Moderately dilated at 24 cm2. - Tricuspid valve: There was moderate regurgitation. - Pulmonary arteries: Poorly visualized. PA peak pressure: 83 mm Hg   (S). - Inferior vena cava: The vessel was dilated. The respirophasic   diameter changes were blunted (< 50%), consistent with elevated   central venous pressure.  Impressions:  - Compared to the prior study in 2015, the EF has improved to   60-65%. There is now severe pulmonary hypertension with RVSP of   83 mmHg (up from 51 mmHg). The RV is moderately dilated with   reduced systolic function, there is moderate TR and moderate RAE,   a-fib was present, there is mild to moderate MR, the LA is   moderately dilated.  Assessment/Plan: 1.  Longstanding persistent atrial fibrillation - managed with rate control. No longer a candidate for anticoagulation. CHADSVASC at least 5. Poor Watchman candidate as well.   2. SOB - most likely multifactorial. Progressive pulmonary HTN noted on echo.   3. HTN - BP fair - he does not wish to make changes.   4. Prostate cancer - does not wish to take further therapy.  Seems like he has opted for more of a conservative approach.   5. OSA He is not compliant with therapy  5. Edema - Likely multifactorial but now using Lasix - salt still an issue - I don't really see this changing.   Overall prognosis tenuous at best. Would favor more supportive therapy.   Current medicines are reviewed with the patient today.  The patient does not have concerns regarding medicines other than what has been noted above.  The following changes have been made:  See above.  Labs/ tests ordered today include:   No orders of the defined types were placed in this encounter.  Disposition:   FU with Dr. [name] as planned in April.    Patient is agreeable to this plan and will call if any problems develop in the interim.   Signed: Burtis Junes, RN, ANP-C 08/22/2016 1:59 PM  [facility] [address] [place], [place]  [address] Phone: [phone] Fax: [phone]

## 2016-08-22 NOTE — Patient Instructions (Addendum)
We will be checking the following labs today - NONE   Medication Instructions:    Continue with your current medicines.   Ok to stay off the Atorvastatin - just let Dr. Luan Pulling know - he may wish to change or lower the dose.     Testing/Procedures To Be Arranged:  N/A  Follow-Up:   See Dr. [name] in April as planned.      Other Special Instructions:   N/A    If you need a refill on your cardiac medications before your next appointment, please call your pharmacy.   Call the [place] office at [phone] if you have any questions, problems or concerns.

## 2016-09-05 DIAGNOSIS — I10 Essential (primary) hypertension: Secondary | ICD-10-CM | POA: Diagnosis not present

## 2016-09-05 DIAGNOSIS — L299 Pruritus, unspecified: Secondary | ICD-10-CM | POA: Diagnosis not present

## 2016-09-05 DIAGNOSIS — R609 Edema, unspecified: Secondary | ICD-10-CM | POA: Diagnosis not present

## 2016-09-05 DIAGNOSIS — N401 Enlarged prostate with lower urinary tract symptoms: Secondary | ICD-10-CM | POA: Diagnosis not present

## 2016-09-20 ENCOUNTER — Encounter: Payer: Self-pay | Admitting: Internal Medicine

## 2016-09-25 ENCOUNTER — Other Ambulatory Visit: Payer: Self-pay | Admitting: Vascular Surgery

## 2016-09-25 DIAGNOSIS — R600 Localized edema: Secondary | ICD-10-CM

## 2016-10-04 DIAGNOSIS — I482 Chronic atrial fibrillation: Secondary | ICD-10-CM | POA: Diagnosis not present

## 2016-10-04 DIAGNOSIS — M545 Low back pain: Secondary | ICD-10-CM | POA: Diagnosis not present

## 2016-10-04 DIAGNOSIS — I10 Essential (primary) hypertension: Secondary | ICD-10-CM | POA: Diagnosis not present

## 2016-10-04 DIAGNOSIS — I872 Venous insufficiency (chronic) (peripheral): Secondary | ICD-10-CM | POA: Diagnosis not present

## 2016-10-06 ENCOUNTER — Ambulatory Visit (INDEPENDENT_AMBULATORY_CARE_PROVIDER_SITE_OTHER)
Admission: RE | Admit: 2016-10-06 | Discharge: 2016-10-06 | Disposition: A | Discharge status: Home / Self Care | Payer: PPO | Source: Ambulatory Visit | Attending: Vascular Surgery | Admitting: Vascular Surgery

## 2016-10-06 ENCOUNTER — Ambulatory Visit: Payer: PPO | Admitting: Urology

## 2016-10-06 DIAGNOSIS — Z8673 Personal history of transient ischemic attack (TIA), and cerebral infarction without residual deficits: Secondary | ICD-10-CM | POA: Diagnosis not present

## 2016-10-06 DIAGNOSIS — G4733 Obstructive sleep apnea (adult) (pediatric): Secondary | ICD-10-CM | POA: Diagnosis not present

## 2016-10-06 DIAGNOSIS — L299 Pruritus, unspecified: Secondary | ICD-10-CM | POA: Diagnosis not present

## 2016-10-06 DIAGNOSIS — M7989 Other specified soft tissue disorders: Secondary | ICD-10-CM | POA: Diagnosis not present

## 2016-10-06 DIAGNOSIS — D509 Iron deficiency anemia, unspecified: Secondary | ICD-10-CM | POA: Diagnosis not present

## 2016-10-06 DIAGNOSIS — R609 Edema, unspecified: Secondary | ICD-10-CM | POA: Diagnosis not present

## 2016-10-06 DIAGNOSIS — Z79899 Other long term (current) drug therapy: Secondary | ICD-10-CM | POA: Diagnosis not present

## 2016-10-06 DIAGNOSIS — I5031 Acute diastolic (congestive) heart failure: Secondary | ICD-10-CM | POA: Diagnosis not present

## 2016-10-06 DIAGNOSIS — Z8546 Personal history of malignant neoplasm of prostate: Secondary | ICD-10-CM | POA: Diagnosis not present

## 2016-10-06 DIAGNOSIS — L03116 Cellulitis of left lower limb: Secondary | ICD-10-CM | POA: Diagnosis not present

## 2016-10-06 DIAGNOSIS — I5033 Acute on chronic diastolic (congestive) heart failure: Secondary | ICD-10-CM | POA: Diagnosis not present

## 2016-10-06 DIAGNOSIS — N182 Chronic kidney disease, stage 2 (mild): Secondary | ICD-10-CM | POA: Diagnosis not present

## 2016-10-06 DIAGNOSIS — I481 Persistent atrial fibrillation: Secondary | ICD-10-CM | POA: Diagnosis not present

## 2016-10-06 DIAGNOSIS — L03119 Cellulitis of unspecified part of limb: Secondary | ICD-10-CM | POA: Diagnosis not present

## 2016-10-06 DIAGNOSIS — N179 Acute kidney failure, unspecified: Secondary | ICD-10-CM | POA: Diagnosis not present

## 2016-10-06 DIAGNOSIS — I10 Essential (primary) hypertension: Secondary | ICD-10-CM | POA: Diagnosis not present

## 2016-10-06 DIAGNOSIS — L03115 Cellulitis of right lower limb: Secondary | ICD-10-CM | POA: Diagnosis not present

## 2016-10-06 DIAGNOSIS — I517 Cardiomegaly: Secondary | ICD-10-CM | POA: Diagnosis not present

## 2016-10-06 DIAGNOSIS — R600 Localized edema: Secondary | ICD-10-CM | POA: Diagnosis not present

## 2016-10-06 DIAGNOSIS — I130 Hypertensive heart and chronic kidney disease with heart failure and stage 1 through stage 4 chronic kidney disease, or unspecified chronic kidney disease: Secondary | ICD-10-CM | POA: Diagnosis not present

## 2016-10-06 DIAGNOSIS — I509 Heart failure, unspecified: Secondary | ICD-10-CM | POA: Diagnosis not present

## 2016-10-06 DIAGNOSIS — I482 Chronic atrial fibrillation: Secondary | ICD-10-CM | POA: Diagnosis not present

## 2016-10-06 DIAGNOSIS — L0390 Cellulitis, unspecified: Secondary | ICD-10-CM | POA: Diagnosis not present

## 2016-10-06 DIAGNOSIS — Z7902 Long term (current) use of antithrombotics/antiplatelets: Secondary | ICD-10-CM | POA: Diagnosis not present

## 2016-10-06 DIAGNOSIS — R5381 Other malaise: Secondary | ICD-10-CM | POA: Diagnosis not present

## 2016-10-06 DIAGNOSIS — K219 Gastro-esophageal reflux disease without esophagitis: Secondary | ICD-10-CM | POA: Diagnosis not present

## 2016-10-06 DIAGNOSIS — Z87891 Personal history of nicotine dependence: Secondary | ICD-10-CM | POA: Diagnosis not present

## 2016-10-06 DIAGNOSIS — Z9111 Patient's noncompliance with dietary regimen: Secondary | ICD-10-CM | POA: Diagnosis not present

## 2016-10-06 DIAGNOSIS — D649 Anemia, unspecified: Secondary | ICD-10-CM | POA: Diagnosis not present

## 2016-10-07 ENCOUNTER — Emergency Department (HOSPITAL_COMMUNITY): Payer: PPO

## 2016-10-07 ENCOUNTER — Encounter (HOSPITAL_COMMUNITY): Payer: Self-pay | Admitting: Emergency Medicine

## 2016-10-07 ENCOUNTER — Inpatient Hospital Stay (HOSPITAL_COMMUNITY)
Admission: EM | Admit: 2016-10-07 | Discharge: 2016-10-12 | DRG: 291 | Disposition: A | Discharge status: Home / Self Care | Payer: PPO | Attending: Pulmonary Disease | Admitting: Pulmonary Disease

## 2016-10-07 DIAGNOSIS — L03115 Cellulitis of right lower limb: Secondary | ICD-10-CM | POA: Diagnosis present

## 2016-10-07 DIAGNOSIS — I509 Heart failure, unspecified: Secondary | ICD-10-CM

## 2016-10-07 DIAGNOSIS — I4821 Permanent atrial fibrillation: Secondary | ICD-10-CM | POA: Diagnosis present

## 2016-10-07 DIAGNOSIS — I481 Persistent atrial fibrillation: Secondary | ICD-10-CM | POA: Diagnosis present

## 2016-10-07 DIAGNOSIS — Z79899 Other long term (current) drug therapy: Secondary | ICD-10-CM

## 2016-10-07 DIAGNOSIS — D649 Anemia, unspecified: Secondary | ICD-10-CM | POA: Diagnosis present

## 2016-10-07 DIAGNOSIS — L03116 Cellulitis of left lower limb: Secondary | ICD-10-CM | POA: Diagnosis present

## 2016-10-07 DIAGNOSIS — L03119 Cellulitis of unspecified part of limb: Secondary | ICD-10-CM | POA: Diagnosis present

## 2016-10-07 DIAGNOSIS — L0390 Cellulitis, unspecified: Secondary | ICD-10-CM | POA: Diagnosis present

## 2016-10-07 DIAGNOSIS — R609 Edema, unspecified: Secondary | ICD-10-CM

## 2016-10-07 DIAGNOSIS — Z7902 Long term (current) use of antithrombotics/antiplatelets: Secondary | ICD-10-CM

## 2016-10-07 DIAGNOSIS — R5381 Other malaise: Secondary | ICD-10-CM | POA: Diagnosis present

## 2016-10-07 DIAGNOSIS — Z8673 Personal history of transient ischemic attack (TIA), and cerebral infarction without residual deficits: Secondary | ICD-10-CM

## 2016-10-07 DIAGNOSIS — N179 Acute kidney failure, unspecified: Secondary | ICD-10-CM | POA: Diagnosis present

## 2016-10-07 DIAGNOSIS — I4819 Other persistent atrial fibrillation: Secondary | ICD-10-CM | POA: Diagnosis present

## 2016-10-07 DIAGNOSIS — M7989 Other specified soft tissue disorders: Secondary | ICD-10-CM | POA: Diagnosis not present

## 2016-10-07 DIAGNOSIS — Z9111 Patient's noncompliance with dietary regimen: Secondary | ICD-10-CM

## 2016-10-07 DIAGNOSIS — Z8546 Personal history of malignant neoplasm of prostate: Secondary | ICD-10-CM

## 2016-10-07 DIAGNOSIS — I10 Essential (primary) hypertension: Secondary | ICD-10-CM

## 2016-10-07 DIAGNOSIS — D509 Iron deficiency anemia, unspecified: Secondary | ICD-10-CM | POA: Diagnosis present

## 2016-10-07 DIAGNOSIS — L299 Pruritus, unspecified: Secondary | ICD-10-CM | POA: Diagnosis present

## 2016-10-07 DIAGNOSIS — G4733 Obstructive sleep apnea (adult) (pediatric): Secondary | ICD-10-CM | POA: Diagnosis present

## 2016-10-07 DIAGNOSIS — N182 Chronic kidney disease, stage 2 (mild): Secondary | ICD-10-CM | POA: Diagnosis present

## 2016-10-07 DIAGNOSIS — K219 Gastro-esophageal reflux disease without esophagitis: Secondary | ICD-10-CM | POA: Diagnosis present

## 2016-10-07 DIAGNOSIS — I517 Cardiomegaly: Secondary | ICD-10-CM | POA: Diagnosis not present

## 2016-10-07 DIAGNOSIS — I130 Hypertensive heart and chronic kidney disease with heart failure and stage 1 through stage 4 chronic kidney disease, or unspecified chronic kidney disease: Principal | ICD-10-CM | POA: Diagnosis present

## 2016-10-07 DIAGNOSIS — I4891 Unspecified atrial fibrillation: Secondary | ICD-10-CM | POA: Diagnosis present

## 2016-10-07 DIAGNOSIS — I482 Chronic atrial fibrillation, unspecified: Secondary | ICD-10-CM

## 2016-10-07 DIAGNOSIS — Z87891 Personal history of nicotine dependence: Secondary | ICD-10-CM

## 2016-10-07 DIAGNOSIS — I5033 Acute on chronic diastolic (congestive) heart failure: Secondary | ICD-10-CM | POA: Diagnosis present

## 2016-10-07 HISTORY — DX: Malignant neoplasm of prostate: C61

## 2016-10-07 NOTE — ED Provider Notes (Signed)
[facility] DEPT Provider Note   CSN: [id number] Arrival date & time: 10/07/16  2029 By signing my name below, I, Georgette Shell, attest that this documentation has been prepared under the direction and in the presence of Ezequiel Essex, MD. Electronically Signed: Georgette Shell, ED Scribe. 10/07/16. 11:09 PM.  History   Chief Complaint Chief Complaint  Patient presents with  . Peripheral Edema    HPI The history is provided by the patient and the spouse. No language interpreter was used.   HPI Comments: Bernard Ramirez is a 83 y.o. male who presents to the Emergency Department complaining of redness, pain, and swelling to BLEs beginning two weeks ago. Pt describes the pain as a burning sensation; he also notes the area is intermittently pruritic. He has had symptoms similar to this prior but notes this is significantly worse. Pt was seen by his PCP 3 days ago and was given five 100mg  Torsemide. He has since taken three of them with no relief. Wife at bedside reports that pt was originally on Lasix but was placed off it when prescribed the Torsemide. Pt further states [facility], where they did a doppler of his BLEs but the results have not come back yet. Pt denies fever, chills, chest pain, or any other associated symptoms.   Past Medical History:  Diagnosis Date  . Anemia    04/2012: H&H-10.2/31.5, MCV-77; 06/2012:12/38.9  . Arthritis   . Benign prostatic hypertrophy    s/p transurethral resection of the prostate  . Chest pain    2004; with dyspnea, stress nuclear-normal EF + questionable inferior wall ischemia, normal coronary angiography;  2010-negative stress nuclear  . CVA (cerebral infarction)    asymptomatic (seen on CT)  . Diarrhea    h/o Hemoccult-positive stool  . Dysrhythmia   . Edema of both legs   . GERD (gastroesophageal reflux disease)   . History of blood transfusion   . History of kidney stones   . History of urinary tract infection    . Hypertension   . Jerking movements of extremities    left arm   . Low back pain   . Obstructive sleep apnea    pt states can not use the CPAP  . Peripheral neuropathy (HCC)    lower legs and feet bilat   . Persistent atrial fibrillation ([place]) 2013   Asymptomatic; diagnosed in 05/2012  . Pneumonia   . Shortness of breath dyspnea    pt states only develops SOB if tries to do something too quickly  . Upper GI bleed 1985   1985-peptic ulcer disease; initial hemigastrectomy; subsequent Billroth II  . Urinary hesitancy   . Vertigo    ED evaluation-06/2012    Patient Active Problem List   Diagnosis Date Noted  . Prostate cancer ([place]) 04/13/2016  . Acute renal insufficiency due to procedure ([place]) 04/13/2016  . BPH (benign prostatic hypertrophy) with urinary obstruction 04/11/2016  . Shortness of breath 09/27/2015  . Preop cardiovascular exam 09/27/2015  . Melena 05/11/2015  . Rectal bleeding 05/11/2015  . Abnormal x-ray of abdomen 05/11/2015  . Absolute anemia   . Chronic kidney disease, stage II (mild) 07/11/2012  . Fasting hyperglycemia 07/11/2012  . Chest pain   . Vertigo   . Atrial fibrillation ([place])   . Anemia 06/10/2009  . Essential hypertension 06/10/2009  . DIARRHEA, CHRONIC 06/10/2009  . PEPTIC ULCER, ACUTE, HEMORRHAGE, HX OF 06/10/2009  . DIVERTICULITIS, HX OF 06/10/2009  Past Surgical History:  Procedure Laterality Date  . Billroth II    . CHOLECYSTECTOMY    . COLONOSCOPY  Approximately 2000  . COLONOSCOPY  10/2012   Colonoscopy March 2014 at [facility], difficulty exam requiring fluoroscopy and overtube. Patient developed severe bradycardia again during his procedure like he did [facility]. Multiple polyps removed which were tubular adenomas. Previously tattooed sites at 20 and 30 cm were free of any polypoid change. Left-sided diverticulosis noted.  . COLONOSCOPY WITH ESOPHAGOGASTRODUODENOSCOPY (EGD)  06-17-2009   YOV:ZCHYIFOY tubular adenomas and 2  tubulovillous adenomas, incomplete, 3 clips placed  . ESOPHAGOGASTRODUODENOSCOPY  06/17/2009   SLF: normal/chronic gastritis (non-h pylori)  . ESOPHAGOGASTRODUODENOSCOPY N/A 05/11/2015   DXA:JOINOMV anemai due to postgastrectomy state/moderate erosive gastritis  . INGUINAL HERNIA REPAIR     Left  . ROTATOR CUFF REPAIR  1991   Bilateral  . TRANSURETHRAL RESECTION OF PROSTATE    . TRANSURETHRAL RESECTION OF PROSTATE N/A 12/27/2012   Procedure: TRANSURETHRAL RESECTION OF THE PROSTATE (TURP);  Surgeon: Marissa Nestle, MD;  Location: [facility];  Service: Urology;  Laterality: N/A;  . TRANSURETHRAL RESECTION OF PROSTATE N/A 04/11/2016   Procedure: TRANSURETHRAL RESECTION OF THE PROSTATE (TURP);  Surgeon: Irine Seal, MD;  Location: [facility];  Service: Urology;  Laterality: N/A;  . VAGOTOMY AND PYLOROPLASTY  1970s   Details of procedure uncertain; [address]       Home Medications    Prior to Admission medications   Medication Sig Start Date End Date Taking? Authorizing Provider  atorvastatin (LIPITOR) 80 MG tablet Take 80 mg by mouth daily.    Historical Provider, MD  clobetasol cream (TEMOVATE) 4.70 % Apply 1 application topically daily.    Historical Provider, MD  clopidogrel (PLAVIX) 75 MG tablet Take 75 mg by mouth daily. 10/06/16   Historical Provider, MD  diazepam (VALIUM) 5 MG tablet Take 1 tablet by mouth twice daily as needed for anxiety 08/18/15   Historical Provider, MD  furosemide (LASIX) 40 MG tablet Take 40 mg by mouth daily AS DIRECTED 04/10/16   Thompson Grayer, MD  HYDROcodone-acetaminophen (NORCO) 10-325 MG tablet TAKE 1 TABLET  FIVE TIMES DAILY AS NEEDED FOR PAIN 08/27/15   Historical Provider, MD  losartan (COZAAR) 100 MG tablet Take 1 tablet by mouth daily 09/06/15   Historical Provider, MD  meclizine (ANTIVERT) 25 MG tablet Take 25 mg by mouth 4 (four) times daily as needed for dizziness.    Historical Provider, MD  nystatin (MYCOSTATIN/NYSTOP) 100000 UNIT/GM POWD Apply 1 g topically  daily as needed (rash).  10/01/14   Historical Provider, MD  pantoprazole (PROTONIX) 40 MG tablet Take 40 mg by mouth daily.    Historical Provider, MD  torsemide (DEMADEX) 100 MG tablet Take 100 mg by mouth daily. 5 day course starting on 10/04/2016 10/04/16   Historical Provider, MD  triamcinolone cream (KENALOG) 0.1 % Apply 1 application topically daily.     Historical Provider, MD    Family History Family History  Problem Relation Age of Onset  . Diabetes Mellitus II Mother   . Allergic rhinitis Mother   . Lung disease Father   . Allergic rhinitis Father   . Angioedema Neg Hx   . Asthma Neg Hx   . Atopy Neg Hx   . Eczema Neg Hx   . Immunodeficiency Neg Hx   . Urticaria Neg Hx   . Cystic fibrosis Neg Hx   . Lupus Neg Hx   . Emphysema Neg Hx   .  Migraines Neg Hx     Social History Social History  Substance Use Topics  . Smoking status: Former Smoker    Years: 12.00    Types: Cigars    Quit date: 08/22/2003  . Smokeless tobacco: Never Used     Comment: Quit many years ago; few cigars per day; didn't inhale  . Alcohol use No     Allergies   Contrast media [iodinated diagnostic agents]; Gabapentin; Aspirin; Latex; and Tape   Review of Systems Review of Systems 10 Systems reviewed and all are negative for acute change except as noted in the HPI. Physical Exam Updated Vital Signs BP (!) 106/44 (BP Location: Left Arm)   Pulse 98   Temp 98 F (36.7 C) (Oral)   Resp 16   Ht 6' (1.829 m)   Wt 263 lb (119.3 kg)   SpO2 93%   BMI 35.67 kg/m   Physical Exam  Constitutional: He is oriented to person, place, and time. He appears well-developed and well-nourished. No distress.  HENT:  Head: Normocephalic and atraumatic.  Mouth/Throat: Oropharynx is clear and moist. No oropharyngeal exudate.  Eyes: Conjunctivae and EOM are normal. Pupils are equal, round, and reactive to light.  Neck: Normal range of motion. Neck supple.  No meningismus.  Cardiovascular: Normal rate,  regular rhythm, normal heart sounds and intact distal pulses.   No murmur heard. Pulmonary/Chest: Effort normal. No respiratory distress. He has decreased breath sounds.  Decreased breath sounds at the bases.  Abdominal: Soft. There is no tenderness. There is no rebound and no guarding.  Musculoskeletal: Normal range of motion. He exhibits edema. He exhibits no tenderness.  Erythema and edema of lower extremities and dorsal feet to knee bilaterally. Positive warmth, compartments are soft.  Neurological: He is alert and oriented to person, place, and time. No cranial nerve deficit. He exhibits normal muscle tone. Coordination normal.   5/5 strength throughout. CN 2-12 intact.Equal grip strength.   Skin: Skin is warm. There is erythema.  Psychiatric: He has a normal mood and affect. His behavior is normal.  Nursing note and vitals reviewed.    ED Treatments / Results  DIAGNOSTIC STUDIES: Oxygen Saturation is 93% on RA, poor by my interpretation.    COORDINATION OF CARE: 11:08 PM Discussed treatment plan with pt at bedside which includes blood work and CXR and pt agreed to plan.  Labs (all labs ordered are listed, but only abnormal results are displayed) Labs Reviewed  CBC WITH DIFFERENTIAL/PLATELET - Abnormal; Notable for the following:       Result Value   Hemoglobin 10.6 (*)    HCT 35.1 (*)    MCH 23.7 (*)    RDW 17.3 (*)    All other components within normal limits  BASIC METABOLIC PANEL - Abnormal; Notable for the following:    Chloride 98 (*)    CO2 33 (*)    Glucose, Bld 104 (*)    BUN 24 (*)    Creatinine, Ser 1.35 (*)    GFR calc non Af Amer 47 (*)    GFR calc Af Amer 54 (*)    All other components within normal limits  BRAIN NATRIURETIC PEPTIDE  TROPONIN I  I-STAT CG4 LACTIC ACID, ED    EKG  EKG Interpretation None       Radiology Dg Chest 2 View  Result Date: 10/07/2016 CLINICAL DATA:  Peripheral edema of both lower extremities. EXAM: CHEST  2 VIEW  COMPARISON:  06/24/2012 CXR Heart is top-normal  in size. There is mild elevation of the right hemidiaphragm, chronic in appearance. There is aortic atherosclerosis without aneurysm. Mild interstitial edema is noted with minimal atelectasis in the left mid lung and left lung base. No pleural effusion. Degenerative change along the dorsal spine without acute osseous abnormalities. Surgical clips are seen in the right upper quadrant of the abdomen likely from prior cholecystectomy. IMPRESSION: Borderline cardiomegaly with aortic atherosclerosis. Mild interstitial edema. Electronically Signed   By: Ashley Royalty M.D.   On: 10/07/2016 23:59    Procedures Procedures (including critical care time)  Medications Ordered in ED Medications - No data to display   Initial Impression / Assessment and Plan / ED Course  I have reviewed the triage vital signs and the nursing notes.  Pertinent labs & imaging results that were available during my care of the patient were reviewed by me and considered in my medical decision making (see chart for details).     Patient with progressively worsening lower extremity edema and erythema. No fever or vomiting. Had Dopplers done yesterday without results being known. Denies chest pain or shortness of breath.  Patient with worsening pain and difficulty with ambulation. He has extensive swelling and erythema bilaterally. The legs are warm and erythematous. X-ray shows mild interstitial edema will give IV Lasix. Afebrile. Normal white count.  Treat for CHF exacerbation. Possible component of cellulitis.  Has failed outpatient diuresis.  OBservation admission d/w Dr. Lorin Mercy.  Final Clinical Impressions(s) / ED Diagnoses   Final diagnoses:  Cellulitis of lower extremity, unspecified laterality  Leg swelling    New Prescriptions New Prescriptions   No medications on file   I personally performed the services described in this documentation, which was scribed in my  presence. The recorded information has been reviewed and is accurate.    Ezequiel Essex, MD 10/08/16 [id number]

## 2016-10-07 NOTE — ED Triage Notes (Signed)
Pt here for peripheral edema to bilateral lower extremities. Pt's lower legs edematous and red. Pt states [facility] on [facility] where they did a doppler of lower extemities but no results as of yet. Pt here because of the pain, burning, and itching he is experiencing.

## 2016-10-08 ENCOUNTER — Encounter (HOSPITAL_COMMUNITY): Payer: Self-pay | Admitting: Internal Medicine

## 2016-10-08 ENCOUNTER — Inpatient Hospital Stay (HOSPITAL_COMMUNITY): Payer: PPO

## 2016-10-08 DIAGNOSIS — I5031 Acute diastolic (congestive) heart failure: Secondary | ICD-10-CM | POA: Diagnosis not present

## 2016-10-08 DIAGNOSIS — I509 Heart failure, unspecified: Secondary | ICD-10-CM | POA: Diagnosis not present

## 2016-10-08 DIAGNOSIS — I5033 Acute on chronic diastolic (congestive) heart failure: Secondary | ICD-10-CM | POA: Diagnosis not present

## 2016-10-08 DIAGNOSIS — Z79899 Other long term (current) drug therapy: Secondary | ICD-10-CM | POA: Diagnosis not present

## 2016-10-08 DIAGNOSIS — R5381 Other malaise: Secondary | ICD-10-CM | POA: Diagnosis not present

## 2016-10-08 DIAGNOSIS — Z9111 Patient's noncompliance with dietary regimen: Secondary | ICD-10-CM | POA: Diagnosis not present

## 2016-10-08 DIAGNOSIS — L03119 Cellulitis of unspecified part of limb: Secondary | ICD-10-CM | POA: Diagnosis not present

## 2016-10-08 DIAGNOSIS — I482 Chronic atrial fibrillation: Secondary | ICD-10-CM | POA: Diagnosis not present

## 2016-10-08 DIAGNOSIS — N182 Chronic kidney disease, stage 2 (mild): Secondary | ICD-10-CM | POA: Diagnosis not present

## 2016-10-08 DIAGNOSIS — D509 Iron deficiency anemia, unspecified: Secondary | ICD-10-CM | POA: Diagnosis not present

## 2016-10-08 DIAGNOSIS — R609 Edema, unspecified: Secondary | ICD-10-CM | POA: Diagnosis not present

## 2016-10-08 DIAGNOSIS — Z8546 Personal history of malignant neoplasm of prostate: Secondary | ICD-10-CM | POA: Diagnosis not present

## 2016-10-08 DIAGNOSIS — D649 Anemia, unspecified: Secondary | ICD-10-CM | POA: Diagnosis not present

## 2016-10-08 DIAGNOSIS — R600 Localized edema: Secondary | ICD-10-CM | POA: Diagnosis not present

## 2016-10-08 DIAGNOSIS — I130 Hypertensive heart and chronic kidney disease with heart failure and stage 1 through stage 4 chronic kidney disease, or unspecified chronic kidney disease: Secondary | ICD-10-CM | POA: Diagnosis not present

## 2016-10-08 DIAGNOSIS — Z8673 Personal history of transient ischemic attack (TIA), and cerebral infarction without residual deficits: Secondary | ICD-10-CM | POA: Diagnosis not present

## 2016-10-08 DIAGNOSIS — N179 Acute kidney failure, unspecified: Secondary | ICD-10-CM

## 2016-10-08 DIAGNOSIS — I481 Persistent atrial fibrillation: Secondary | ICD-10-CM | POA: Diagnosis not present

## 2016-10-08 DIAGNOSIS — L03116 Cellulitis of left lower limb: Secondary | ICD-10-CM | POA: Diagnosis not present

## 2016-10-08 DIAGNOSIS — L0390 Cellulitis, unspecified: Secondary | ICD-10-CM | POA: Diagnosis present

## 2016-10-08 DIAGNOSIS — Z87891 Personal history of nicotine dependence: Secondary | ICD-10-CM | POA: Diagnosis not present

## 2016-10-08 DIAGNOSIS — L03115 Cellulitis of right lower limb: Secondary | ICD-10-CM | POA: Diagnosis not present

## 2016-10-08 DIAGNOSIS — L299 Pruritus, unspecified: Secondary | ICD-10-CM | POA: Diagnosis not present

## 2016-10-08 DIAGNOSIS — I10 Essential (primary) hypertension: Secondary | ICD-10-CM | POA: Diagnosis not present

## 2016-10-08 DIAGNOSIS — Z7902 Long term (current) use of antithrombotics/antiplatelets: Secondary | ICD-10-CM | POA: Diagnosis not present

## 2016-10-08 DIAGNOSIS — K219 Gastro-esophageal reflux disease without esophagitis: Secondary | ICD-10-CM | POA: Diagnosis not present

## 2016-10-08 DIAGNOSIS — G4733 Obstructive sleep apnea (adult) (pediatric): Secondary | ICD-10-CM | POA: Diagnosis not present

## 2016-10-08 HISTORY — DX: Acute kidney failure, unspecified: N17.9

## 2016-10-08 LAB — BASIC METABOLIC PANEL
ANION GAP: 9 (ref 5–15)
BUN: 24 mg/dL — ABNORMAL HIGH (ref 6–20)
CALCIUM: 9.2 mg/dL (ref 8.9–10.3)
CO2: 33 mmol/L — ABNORMAL HIGH (ref 22–32)
CREATININE: 1.35 mg/dL — AB (ref 0.61–1.24)
Chloride: 98 mmol/L — ABNORMAL LOW (ref 101–111)
GFR calc Af Amer: 54 mL/min — ABNORMAL LOW (ref >60)
GFR, EST NON AFRICAN AMERICAN: 47 mL/min — AB (ref >60)
GLUCOSE: 104 mg/dL — AB (ref 65–99)
Potassium: 3.9 mmol/L (ref 3.5–5.1)
Sodium: 140 mmol/L (ref 135–145)

## 2016-10-08 LAB — ECHOCARDIOGRAM COMPLETE
Height: 72 in
Weight: 4257.52 oz

## 2016-10-08 LAB — CBC WITH DIFFERENTIAL/PLATELET
BASOS ABS: 0 10*3/uL (ref 0.0–0.1)
BASOS PCT: 0 %
EOS PCT: 5 %
Eosinophils Absolute: 0.4 10*3/uL (ref 0.0–0.7)
HCT: 35.1 % — ABNORMAL LOW (ref 39.0–52.0)
Hemoglobin: 10.6 g/dL — ABNORMAL LOW (ref 13.0–17.0)
LYMPHS PCT: 14 %
Lymphs Abs: 1 10*3/uL (ref 0.7–4.0)
MCH: 23.7 pg — ABNORMAL LOW (ref 26.0–34.0)
MCHC: 30.2 g/dL (ref 30.0–36.0)
MCV: 78.3 fL (ref 78.0–100.0)
MONO ABS: 0.7 10*3/uL (ref 0.1–1.0)
MONOS PCT: 9 %
Neutro Abs: 5.3 10*3/uL (ref 1.7–7.7)
Neutrophils Relative %: 72 %
PLATELETS: 243 10*3/uL (ref 150–400)
RBC: 4.48 MIL/uL (ref 4.22–5.81)
RDW: 17.3 % — AB (ref 11.5–15.5)
WBC: 7.3 10*3/uL (ref 4.0–10.5)

## 2016-10-08 LAB — TROPONIN I

## 2016-10-08 LAB — I-STAT CG4 LACTIC ACID, ED: Lactic Acid, Venous: 1.03 mmol/L (ref 0.5–1.9)

## 2016-10-08 LAB — BRAIN NATRIURETIC PEPTIDE: B Natriuretic Peptide: 69 pg/mL (ref 0.0–100.0)

## 2016-10-08 MED ORDER — ACETAMINOPHEN 325 MG PO TABS: 650.0000 mg | ORAL_TABLET | ORAL | Status: DC | PRN

## 2016-10-08 MED ORDER — VANCOMYCIN HCL IN DEXTROSE 1-5 GM/200ML-% IV SOLN
1000.0000 mg | INTRAVENOUS | Status: AC
  Administered 2016-10-08 (×2): 1000 mg via INTRAVENOUS
  Filled 2016-10-08: qty 200

## 2016-10-08 MED ORDER — POTASSIUM CHLORIDE CRYS ER 20 MEQ PO TBCR
20.0000 meq | EXTENDED_RELEASE_TABLET | Freq: Two times a day (BID) | ORAL | Status: DC
  Administered 2016-10-08 – 2016-10-12 (×9): 20 meq via ORAL
  Filled 2016-10-08 (×9): qty 1

## 2016-10-08 MED ORDER — ONDANSETRON HCL 4 MG/2ML IJ SOLN: 4.0000 mg | Freq: Four times a day (QID) | INTRAMUSCULAR | Status: DC | PRN

## 2016-10-08 MED ORDER — METOLAZONE 5 MG PO TABS
2.5000 mg | ORAL_TABLET | Freq: Two times a day (BID) | ORAL | Status: DC
  Administered 2016-10-08 – 2016-10-10 (×5): 2.5 mg via ORAL
  Filled 2016-10-08 (×5): qty 1

## 2016-10-08 MED ORDER — MECLIZINE HCL 12.5 MG PO TABS
25.0000 mg | ORAL_TABLET | Freq: Every day | ORAL | Status: DC
  Administered 2016-10-08 – 2016-10-11 (×4): 25 mg via ORAL
  Filled 2016-10-08 (×4): qty 2

## 2016-10-08 MED ORDER — FUROSEMIDE 10 MG/ML IJ SOLN
40.0000 mg | Freq: Once | INTRAMUSCULAR | Status: AC
  Administered 2016-10-08: 40 mg via INTRAVENOUS
  Filled 2016-10-08: qty 4

## 2016-10-08 MED ORDER — FUROSEMIDE 10 MG/ML IJ SOLN
60.0000 mg | Freq: Two times a day (BID) | INTRAMUSCULAR | Status: DC
Start: 2016-10-08 — End: 2016-10-12
  Administered 2016-10-08 – 2016-10-12 (×8): 60 mg via INTRAVENOUS
  Filled 2016-10-08 (×8): qty 6

## 2016-10-08 MED ORDER — CLOPIDOGREL BISULFATE 75 MG PO TABS
75.0000 mg | ORAL_TABLET | Freq: Every day | ORAL | Status: DC
  Administered 2016-10-08 – 2016-10-12 (×5): 75 mg via ORAL
  Filled 2016-10-08 (×5): qty 1

## 2016-10-08 MED ORDER — DIAZEPAM 5 MG PO TABS
5.0000 mg | ORAL_TABLET | Freq: Two times a day (BID) | ORAL | Status: DC | PRN
  Administered 2016-10-08 – 2016-10-10 (×2): 5 mg via ORAL
  Filled 2016-10-08 (×2): qty 1

## 2016-10-08 MED ORDER — SODIUM CHLORIDE 0.9% FLUSH: 3.0000 mL | INTRAVENOUS | Status: DC | PRN

## 2016-10-08 MED ORDER — PERFLUTREN LIPID MICROSPHERE
1.0000 mL | INTRAVENOUS | Status: AC | PRN
  Administered 2016-10-08: 1 mL via INTRAVENOUS
  Administered 2016-10-08: 2 mL via INTRAVENOUS
  Filled 2016-10-08: qty 10

## 2016-10-08 MED ORDER — SODIUM CHLORIDE 0.9% FLUSH
3.0000 mL | Freq: Two times a day (BID) | INTRAVENOUS | Status: DC
  Administered 2016-10-08 – 2016-10-12 (×9): 3 mL via INTRAVENOUS

## 2016-10-08 MED ORDER — LOSARTAN POTASSIUM 50 MG PO TABS
100.0000 mg | ORAL_TABLET | Freq: Every day | ORAL | Status: DC
  Administered 2016-10-08: 100 mg via ORAL
  Filled 2016-10-08: qty 2

## 2016-10-08 MED ORDER — PANTOPRAZOLE SODIUM 40 MG PO TBEC
40.0000 mg | DELAYED_RELEASE_TABLET | Freq: Every day | ORAL | Status: DC
  Administered 2016-10-08 – 2016-10-12 (×5): 40 mg via ORAL
  Filled 2016-10-08 (×5): qty 1

## 2016-10-08 MED ORDER — ENOXAPARIN SODIUM 40 MG/0.4ML ~~LOC~~ SOLN
40.0000 mg | SUBCUTANEOUS | Status: DC
  Administered 2016-10-08 – 2016-10-12 (×5): 40 mg via SUBCUTANEOUS
  Filled 2016-10-08 (×5): qty 0.4

## 2016-10-08 MED ORDER — SODIUM CHLORIDE 0.9 % IV SOLN: 250.0000 mL | INTRAVENOUS | Status: DC | PRN

## 2016-10-08 MED ORDER — ATORVASTATIN CALCIUM 40 MG PO TABS
80.0000 mg | ORAL_TABLET | Freq: Every day | ORAL | Status: DC
  Administered 2016-10-08 – 2016-10-12 (×5): 80 mg via ORAL
  Filled 2016-10-08 (×5): qty 2

## 2016-10-08 MED ORDER — HYDROCODONE-ACETAMINOPHEN 10-325 MG PO TABS
1.0000 | ORAL_TABLET | Freq: Every day | ORAL | Status: DC | PRN
  Administered 2016-10-08 (×3): 1 via ORAL
  Filled 2016-10-08 (×3): qty 1

## 2016-10-08 MED ORDER — FUROSEMIDE 10 MG/ML IJ SOLN
20.0000 mg | INTRAMUSCULAR | Status: DC
  Administered 2016-10-08 (×2): 20 mg via INTRAVENOUS
  Filled 2016-10-08 (×2): qty 2

## 2016-10-08 MED ORDER — VANCOMYCIN HCL IN DEXTROSE 1-5 GM/200ML-% IV SOLN
INTRAVENOUS | Status: AC
  Filled 2016-10-08: qty 200

## 2016-10-08 NOTE — ED Notes (Signed)
Orders for foley cath were [place]. Pt did not get a foleym he got a condom cath.

## 2016-10-08 NOTE — Progress Notes (Signed)
*  PRELIMINARY RESULTS* Echocardiogram 2D Echocardiogram has been performed with [facility].  Samuel Germany 10/08/2016, 10:08 AM

## 2016-10-08 NOTE — Progress Notes (Signed)
ANTIBIOTIC CONSULT NOTE-Preliminary  Pharmacy Consult for vancomycin Indication: cellulitis  Allergies  Allergen Reactions  . Contrast Media [Iodinated Diagnostic Agents]     Pt states he was given "dye" 20 yrs ago, anaphylaxis & syncope - was told to never have it again   . Gabapentin Itching  . Aspirin Other (See Comments)    Blistering   . Latex Rash  . Tape Rash    EKG leads    Patient Measurements: Height: 6' (182.9 cm) Weight: 263 lb (119.3 kg) IBW/kg (Calculated) : 77.6 Adjusted Body Weight:   Vital Signs: Temp: 98 F (36.7 C) (02/17 2042) Temp Source: Oral (02/17 2042) BP: 124/67 (02/18 0143) Pulse Rate: 95 (02/18 0143)  Labs:  Recent Labs  10/08/16 0018  WBC 7.3  HGB 10.6*  PLT 243  CREATININE 1.35*    Estimated Creatinine Clearance: 55.3 mL/min (by C-G formula based on SCr of 1.35 mg/dL (H)).  No results for input(s): VANCOTROUGH, VANCOPEAK, VANCORANDOM, GENTTROUGH, GENTPEAK, GENTRANDOM, TOBRATROUGH, TOBRAPEAK, TOBRARND, AMIKACINPEAK, AMIKACINTROU, AMIKACIN in the last 72 hours.   Microbiology: No results found for this or any previous visit (from the past 720 hour(s)).  Medical History: Past Medical History:  Diagnosis Date  . Anemia    04/2012: H&H-10.2/31.5, MCV-77; 06/2012:12/38.9  . Arthritis   . Benign prostatic hypertrophy    s/p transurethral resection of the prostate  . Chest pain    2004; with dyspnea, stress nuclear-normal EF + questionable inferior wall ischemia, normal coronary angiography;  2010-negative stress nuclear  . CVA (cerebral infarction)    asymptomatic (seen on CT)  . Diarrhea    h/o Hemoccult-positive stool  . Dysrhythmia   . Edema of both legs   . GERD (gastroesophageal reflux disease)   . History of blood transfusion   . History of kidney stones   . History of urinary tract infection   . Hypertension   . Jerking movements of extremities    left arm   . Low back pain   . Obstructive sleep apnea    pt states  can not use the CPAP  . Peripheral neuropathy (HCC)    lower legs and feet bilat   . Persistent atrial fibrillation ([place]) 2013   Asymptomatic; diagnosed in 05/2012  . Pneumonia   . Shortness of breath dyspnea    pt states only develops SOB if tries to do something too quickly  . Upper GI bleed 1985   1985-peptic ulcer disease; initial hemigastrectomy; subsequent Billroth II  . Urinary hesitancy   . Vertigo    ED evaluation-06/2012    Medications:   (Not in a hospital admission) Scheduled:   Infusions:  . vancomycin     PRN:  Anti-infectives    Start     Dose/Rate Route Frequency Ordered Stop   10/08/16 0200  vancomycin (VANCOCIN) IVPB 1000 mg/200 mL premix     1,000 mg 200 mL/hr over 60 Minutes Intravenous Every 1 hr x 2 10/08/16 0155 10/08/16 0359      Assessment: Pt in ED with bilateral lower extremity edema. Starting vancomycin for cellulitis.   Goal of Therapy:  Vancomycin trough level 10-15 mcg/ml  Plan:  Preliminary review of pertinent patient information completed.  Protocol will be initiated with a one-time dose(s) of vancomycin 1 gram Q1 hr x 2 doses for 2 gram loading dose.  Bernard Ramirez clinical pharmacist will complete review during morning rounds to assess patient and finalize treatment regimen.  Bernard Ramirez, RPH 10/08/2016,1:56 AM

## 2016-10-08 NOTE — H&P (Signed)
History and Physical    Bernard Ramirez CXK:[id number] DOB: Jul 14, 1931 DOA: 10/07/2016  PCP: Alonza Bogus, MD Consultants:  Ples Specter, Ledell Noss; Urology - Jeffie Pollock; [place]; cardiology Patient coming from: home - lives with wife; NOK: wife, [phone]; [phone]  Chief Complaint: lower extremity edema  HPI: Bernard Ramirez is a 83 y.o. male with medical history significant of stage 4 prostate cancer diagnosed in 11/17; PAF not on anticoagulation; peripheral neuropathy; OSA not on CPAP; chronic pain; HTN; and chronic LE edema presenting with "my legs swoll up".  His wife reports that they have been hurting, itching burning, swollen, looks like a bad sunburn.  Has been ongoing for about 2-3 weeks.  Had testing at the vascular surgery office Friday, has appt this coming Friday to see the physician.  Saw Dr. Luan Pulling Wednesday - had been taking Lasix 40 mg, took him off that and changed him to Demadex 50 mg BID for 5 days.  No fevers.  Better when he rubs them with warm water.  Nothing makes them worse.  They really have not changed since they saw Dr. Luan Pulling last week and he just couldn't stand it anymore so they came to the ER.  +chronic SOB, has not noticed a change in that.  In the ER, there was concern for cellulitis and so he was given Vancomycin x 1 as well as Lasix 40 mg IV.  Review of Systems: As per HPI; otherwise 10 point review of systems reviewed and negative.   Ambulatory Status:  Ambulates with a cane  Past Medical History:  Diagnosis Date  . Anemia    04/2012: H&H-10.2/31.5, MCV-77; 06/2012:12/38.9  . Arthritis   . Benign prostatic hypertrophy    s/p transurethral resection of the prostate  . Chest pain    2004; with dyspnea, stress nuclear-normal EF + questionable inferior wall ischemia, normal coronary angiography;  2010-negative stress nuclear  . CVA (cerebral infarction)    asymptomatic (seen on CT)  . Diarrhea    h/o Hemoccult-positive stool  . Dysrhythmia   .  Edema of both legs   . GERD (gastroesophageal reflux disease)   . History of blood transfusion   . History of kidney stones   . History of urinary tract infection   . Hypertension   . Jerking movements of extremities    left arm   . Low back pain   . Obstructive sleep apnea    pt states can not use the CPAP  . Peripheral neuropathy (HCC)    lower legs and feet bilat   . Persistent atrial fibrillation ([place]) 2013   Asymptomatic; diagnosed in 05/2012  . Pneumonia   . Prostate cancer ([place]) 2017   stage 4  . Shortness of breath dyspnea    pt states only develops SOB if tries to do something too quickly  . Upper GI bleed 1985   1985-peptic ulcer disease; initial hemigastrectomy; subsequent Billroth II  . Urinary hesitancy   . Vertigo    ED evaluation-06/2012    Past Surgical History:  Procedure Laterality Date  . Billroth II    . CHOLECYSTECTOMY    . COLONOSCOPY  Approximately 2000  . COLONOSCOPY  10/2012   Colonoscopy March 2014 at [facility], difficulty exam requiring fluoroscopy and overtube. Patient developed severe bradycardia again during his procedure like he did [facility]. Multiple polyps removed which were tubular adenomas. Previously tattooed sites at 20 and 30 cm were free of any polypoid change. Left-sided diverticulosis noted.  Marland Kitchen  COLONOSCOPY WITH ESOPHAGOGASTRODUODENOSCOPY (EGD)  06-17-2009   [id number] tubular adenomas and 2 tubulovillous adenomas, incomplete, 3 clips placed  . ESOPHAGOGASTRODUODENOSCOPY  06/17/2009   SLF: normal/chronic gastritis (non-h pylori)  . ESOPHAGOGASTRODUODENOSCOPY N/A 05/11/2015   [id number] anemai due to postgastrectomy state/moderate erosive gastritis  . INGUINAL HERNIA REPAIR     Left  . ROTATOR CUFF REPAIR  1991   Bilateral  . TRANSURETHRAL RESECTION OF PROSTATE  06/2016  . TRANSURETHRAL RESECTION OF PROSTATE N/A 12/27/2012   Procedure: TRANSURETHRAL RESECTION OF THE PROSTATE (TURP);  Surgeon: Marissa Nestle, MD;  Location: [facility];  Service: Urology;  Laterality: N/A;  . TRANSURETHRAL RESECTION OF PROSTATE N/A 04/11/2016   Procedure: TRANSURETHRAL RESECTION OF THE PROSTATE (TURP);  Surgeon: Irine Seal, MD;  Location: [facility] ORS;  Service: Urology;  Laterality: N/A;  . VAGOTOMY AND PYLOROPLASTY  1970s   Details of procedure uncertain; 9629B    Social History   Social History  . Marital status: Married    Spouse name: N/A  . Number of children: 5  . Years of education: N/A   Occupational History  . Retired [employer]   .  Retired   Social History Main Topics  . Smoking status: Former Smoker    Years: 12.00    Types: Cigars    Quit date: 08/22/2003  . Smokeless tobacco: Never Used     Comment: Quit many years ago; few cigars per day; didn't inhale  . Alcohol use No  . Drug use: No  . Sexual activity: Not on file   Other Topics Concern  . Not on file   Social History Narrative   Lives w/ wife in [place]   Retired truck driver       Allergies  Allergen Reactions  . Contrast Media [Iodinated Diagnostic Agents]     Pt states he was given "dye" 20 yrs ago, anaphylaxis & syncope - was told to never have it again   . Gabapentin Itching  . Aspirin Other (See Comments)    Blistering   . Latex Rash  . Tape Rash    EKG leads    Family History  Problem Relation Age of Onset  . Diabetes Mellitus II Mother   . Allergic rhinitis Mother   . Lung disease Father   . Allergic rhinitis Father   . Angioedema Neg Hx   . Asthma Neg Hx   . Atopy Neg Hx   . Eczema Neg Hx   . Immunodeficiency Neg Hx   . Urticaria Neg Hx   . Cystic fibrosis Neg Hx   . Lupus Neg Hx   . Emphysema Neg Hx   . Migraines Neg Hx     Prior to Admission medications   Medication Sig Start Date End Date Taking? Authorizing Provider  atorvastatin (LIPITOR) 80 MG tablet Take 80 mg by mouth daily.   Yes Historical Provider, MD  clobetasol cream (TEMOVATE) 2.84 % Apply 1 application topically daily as needed.    Yes  Historical Provider, MD  diazepam (VALIUM) 5 MG tablet Take 1 tablet by mouth twice daily as needed for anxiety 08/18/15  Yes Historical Provider, MD  HYDROcodone-acetaminophen (NORCO) 10-325 MG tablet TAKE 1 TABLET  FIVE TIMES DAILY AS NEEDED FOR PAIN 08/27/15  Yes Historical Provider, MD  losartan (COZAAR) 100 MG tablet Take 1 tablet by mouth daily 09/06/15  Yes Historical Provider, MD  meclizine (ANTIVERT) 25 MG tablet Take 25 mg by mouth at bedtime.    Yes Historical Provider,  MD  nystatin (MYCOSTATIN/NYSTOP) 100000 UNIT/GM POWD Apply 1 g topically daily as needed (rash).  10/01/14  Yes Historical Provider, MD  pantoprazole (PROTONIX) 40 MG tablet Take 40 mg by mouth daily.   Yes Historical Provider, MD  torsemide (DEMADEX) 100 MG tablet Take 50 mg by mouth 2 (two) times daily. 5 day course starting on 10/04/2016 10/04/16  Yes Historical Provider, MD  triamcinolone cream (KENALOG) 0.1 % Apply 1 application topically daily as needed (for irritation).    Yes Historical Provider, MD  clopidogrel (PLAVIX) 75 MG tablet Take 75 mg by mouth daily. 10/06/16   Historical Provider, MD  furosemide (LASIX) 40 MG tablet Take 40 mg by mouth daily AS DIRECTED 04/10/16   Thompson Grayer, MD    Physical Exam: Vitals:   10/08/16 0130 10/08/16 0143 10/08/16 0200 10/08/16 0230  BP: 124/67 124/67 154/59 (!) 121/50  Pulse: 89 95 96 94  Resp:  18    Temp:      TempSrc:      SpO2: 95% 93% (!) 81% 91%  Weight:      Height:         General:  Appears calm but uncomfortable and is NAD Eyes:  PERRL, EOMI, normal lids, iris ENT:  grossly normal hearing, lips & tongue, mmm Neck:  no LAD, masses or thyromegaly Cardiovascular:  Irregularly irregular rate/rhythm, no m/r/g.  Respiratory:  Bibasilar crackles, no w/r/r. Normal respiratory effort. Abdomen:  soft, ntnd, NABS Skin:  Marked erythema extending up the bilateral lower legs to the knees.  It is confluent and looks angry with a scant amount of blistering.  Quite  warm. Musculoskeletal:  grossly normal tone BUE/BLE, good ROM, no bony abnormality.  Marked LE edema from the toes to the knees.  Pitting, painful to touch. Psychiatric:  grossly normal mood and affect, speech fluent and appropriate, AOx3 Neurologic:  CN 2-12 grossly intact, moves all extremities in coordinated fashion, sensation intact  Labs on Admission: I have personally reviewed following labs and imaging studies  CBC:  Recent Labs Lab 10/08/16 0018  WBC 7.3  NEUTROABS 5.3  HGB 10.6*  HCT 35.1*  MCV 78.3  PLT 413   Basic Metabolic Panel:  Recent Labs Lab 10/08/16 0018  NA 140  K 3.9  CL 98*  CO2 33*  GLUCOSE 104*  BUN 24*  CREATININE 1.35*  CALCIUM 9.2   GFR: Estimated Creatinine Clearance: 55.3 mL/min (by C-G formula based on SCr of 1.35 mg/dL (H)). Liver Function Tests: No results for input(s): AST, ALT, ALKPHOS, BILITOT, PROT, ALBUMIN in the last 168 hours. No results for input(s): LIPASE, AMYLASE in the last 168 hours. No results for input(s): AMMONIA in the last 168 hours. Coagulation Profile: No results for input(s): INR, PROTIME in the last 168 hours. Cardiac Enzymes: No results for input(s): CKTOTAL, CKMB, CKMBINDEX, TROPONINI in the last 168 hours. BNP (last 3 results) No results for input(s): PROBNP in the last 8760 hours. HbA1C: No results for input(s): HGBA1C in the last 72 hours. CBG: No results for input(s): GLUCAP in the last 168 hours. Lipid Profile: No results for input(s): CHOL, HDL, LDLCALC, TRIG, CHOLHDL, LDLDIRECT in the last 72 hours. Thyroid Function Tests: No results for input(s): TSH, T4TOTAL, FREET4, T3FREE, THYROIDAB in the last 72 hours. Anemia Panel: No results for input(s): VITAMINB12, FOLATE, FERRITIN, TIBC, IRON, RETICCTPCT in the last 72 hours. Urine analysis:    Component Value Date/Time   COLORURINE YELLOW 12/22/2012 1742   APPEARANCEUR CLOUDY (A) 12/22/2012 1742  LABSPEC 1.025 12/22/2012 1742   PHURINE 6.0  12/22/2012 1742   GLUCOSEU NEGATIVE 12/22/2012 1742   HGBUR LARGE (A) 12/22/2012 1742   BILIRUBINUR SMALL (A) 12/22/2012 1742   KETONESUR NEGATIVE 12/22/2012 1742   PROTEINUR 100 (A) 12/22/2012 1742   UROBILINOGEN 1.0 12/22/2012 1742   NITRITE NEGATIVE 12/22/2012 1742   LEUKOCYTESUR SMALL (A) 12/22/2012 1742    Creatinine Clearance: Estimated Creatinine Clearance: 55.3 mL/min (by C-G formula based on SCr of 1.35 mg/dL (H)).  Sepsis Labs: @LABRCNTIP (procalcitonin:4,lacticidven:4) )No results found for this or any previous visit (from the past 240 hour(s)).   Radiological Exams on Admission: Dg Chest 2 View  Result Date: 10/07/2016 CLINICAL DATA:  Peripheral edema of both lower extremities. EXAM: CHEST  2 VIEW COMPARISON:  06/24/2012 CXR Heart is top-normal in size. There is mild elevation of the right hemidiaphragm, chronic in appearance. There is aortic atherosclerosis without aneurysm. Mild interstitial edema is noted with minimal atelectasis in the left mid lung and left lung base. No pleural effusion. Degenerative change along the dorsal spine without acute osseous abnormalities. Surgical clips are seen in the right upper quadrant of the abdomen likely from prior cholecystectomy. IMPRESSION: Borderline cardiomegaly with aortic atherosclerosis. Mild interstitial edema. Electronically Signed   By: Ashley Royalty M.D.   On: 10/07/2016 23:59    EKG: Not done  Assessment/Plan Principal Problem:   Congestive heart failure (CHF) (HCC) Active Problems:   Anemia   Essential hypertension   Atrial fibrillation (HCC)   Cellulitis   Acute kidney injury ([place])   CHF -Patient presenting with chronic, persistent LE edema with marked hyperemia -Normal WBC count, no fever; while cellulitis is a consideration, the patient states that it has not changed over time and this would be unusual in the setting of cellulitis.  He was given Vancomycin x 1 in the ER; will not give additional antibiotics at  this time. -Normal BNP, but he does have crackles on exam as well as mild interstitial edema on CXR. -Will order EKG and troponin now. -Rate controlled with afib on presentation -Echo 2/17 with preserved EF, unable to gauge diastolic function-Will place in observation status with telemetry -Will request echocardiogram on Monday -Will start Plavix - unable to take ASA and started on Plavix as outpatient but he not yet started taking it -Will continue Cozaar -Consider beta blocker both for rate control and for CHF benefits once not in acute heart failure -CHF order set utilized -Was given Lasix 40 mg x 1 in ER and will start 20 mg q4h as well as Zaroxolyn 2.5 mg BID -If this dosing schedule is insufficient to pull fluids off, would consider insulin gtt -Will place foley (prior difficult in placing, so will abort this plan if nursing staff is unsuccessful in 2 attempts) -Continue [place] O2 for now -Mild AKI (Creatinine 1.35, baseline 1.01) at this time, likely from volume overload; will follow -Repeat EKG in AM  Anemia -Hgb 10.6, prior 9.3 -Will follow  Afib -Rate controlled without medication, but would consider adding metoprolol -CHA2DS2-VASc score is 6 (assuming new heart failure and prior stroke), with a stroke rate of 9.8%/year -He likely needs anticoagulation  DVT prophylaxis: Lovenox  Code Status: Full - confirmed with patient/family Family Communication: Wife and son present throughout evaluation Disposition Plan:  Home once clinically improved Consults called: None  Admission status: Admit - It is my clinical opinion that admission to INPATIENT is reasonable and necessary because this patient will require at least 2 midnights in the  hospital to treat this condition based on the medical complexity of the problems presented.  Given the aforementioned information, the predictability of an adverse outcome is felt to be significant.    Karmen Bongo MD [facility]  If  7PM-7AM, please contact night-coverage [email or web address] Password [facility]  10/08/2016, 3:27 AM

## 2016-10-08 NOTE — Progress Notes (Signed)
This is an assumption of care note. He was admitted with presumed heart failure. He has had echocardiogram which is pending. He has what looks like some cellulitic changes in his legs. In the past his problem is felt to be more related to venous insufficiency than to heart failure. I'm going to go ahead and treat him for heart failure for now. Request cardiology consultation in the morning review echocardiogram when available and give him IV Lasix.

## 2016-10-09 DIAGNOSIS — I10 Essential (primary) hypertension: Secondary | ICD-10-CM

## 2016-10-09 DIAGNOSIS — N179 Acute kidney failure, unspecified: Secondary | ICD-10-CM

## 2016-10-09 DIAGNOSIS — L03119 Cellulitis of unspecified part of limb: Secondary | ICD-10-CM

## 2016-10-09 DIAGNOSIS — I5031 Acute diastolic (congestive) heart failure: Secondary | ICD-10-CM

## 2016-10-09 DIAGNOSIS — I481 Persistent atrial fibrillation: Secondary | ICD-10-CM

## 2016-10-09 LAB — BASIC METABOLIC PANEL
ANION GAP: 9 (ref 5–15)
BUN: 26 mg/dL — ABNORMAL HIGH (ref 6–20)
CALCIUM: 9.1 mg/dL (ref 8.9–10.3)
CO2: 33 mmol/L — ABNORMAL HIGH (ref 22–32)
Chloride: 95 mmol/L — ABNORMAL LOW (ref 101–111)
Creatinine, Ser: 1.47 mg/dL — ABNORMAL HIGH (ref 0.61–1.24)
GFR, EST AFRICAN AMERICAN: 49 mL/min — AB (ref >60)
GFR, EST NON AFRICAN AMERICAN: 42 mL/min — AB (ref >60)
Glucose, Bld: 134 mg/dL — ABNORMAL HIGH (ref 65–99)
Potassium: 3.9 mmol/L (ref 3.5–5.1)
Sodium: 137 mmol/L (ref 135–145)

## 2016-10-09 LAB — ALBUMIN: Albumin: 3.8 g/dL (ref 3.5–5.0)

## 2016-10-09 LAB — CBC WITH DIFFERENTIAL/PLATELET
BASOS ABS: 0 10*3/uL (ref 0.0–0.1)
BASOS PCT: 0 %
EOS ABS: 0.4 10*3/uL (ref 0.0–0.7)
Eosinophils Relative: 6 %
HEMATOCRIT: 35.8 % — AB (ref 39.0–52.0)
Hemoglobin: 11.1 g/dL — ABNORMAL LOW (ref 13.0–17.0)
Lymphocytes Relative: 19 %
Lymphs Abs: 1.3 10*3/uL (ref 0.7–4.0)
MCH: 24 pg — ABNORMAL LOW (ref 26.0–34.0)
MCHC: 31 g/dL (ref 30.0–36.0)
MCV: 77.5 fL — ABNORMAL LOW (ref 78.0–100.0)
Monocytes Absolute: 0.8 10*3/uL (ref 0.1–1.0)
Monocytes Relative: 11 %
NEUTROS ABS: 4.5 10*3/uL (ref 1.7–7.7)
NEUTROS PCT: 64 %
Platelets: 251 10*3/uL (ref 150–400)
RBC: 4.62 MIL/uL (ref 4.22–5.81)
RDW: 17.2 % — ABNORMAL HIGH (ref 11.5–15.5)
WBC: 7 10*3/uL (ref 4.0–10.5)

## 2016-10-09 LAB — RETICULOCYTES
RBC.: 5.03 MIL/uL (ref 4.22–5.81)
RETIC CT PCT: 1.1 % (ref 0.4–3.1)
Retic Count, Absolute: 55.3 10*3/uL (ref 19.0–186.0)

## 2016-10-09 LAB — VITAMIN B12: VITAMIN B 12: 111 pg/mL — AB (ref 180–914)

## 2016-10-09 LAB — IRON AND TIBC
IRON: 38 ug/dL — AB (ref 45–182)
Saturation Ratios: 7 % — ABNORMAL LOW (ref 17.9–39.5)
TIBC: 559 ug/dL — ABNORMAL HIGH (ref 250–450)
UIBC: 521 ug/dL

## 2016-10-09 LAB — FOLATE: Folate: 21.2 ng/mL (ref >5.9)

## 2016-10-09 LAB — FERRITIN: Ferritin: 12 ng/mL — ABNORMAL LOW (ref 24–336)

## 2016-10-09 MED ORDER — HYDROCERIN EX CREA
TOPICAL_CREAM | Freq: Two times a day (BID) | CUTANEOUS | Status: DC | PRN
  Administered 2016-10-09: 1 via TOPICAL
  Administered 2016-10-09: 18:00:00 via TOPICAL
  Filled 2016-10-09: qty 113

## 2016-10-09 NOTE — Consult Note (Addendum)
CARDIOLOGY CONSULT NOTE   Patient ID: Bernard Ramirez MRN: [id number] DOB/AGE: 1931-01-02 83 y.o.  Admit Date: 10/07/2016 Referring Physician: Sinda Du MD Primary Physician: Alonza Bogus, MD Consulting Cardiologist: Kate Sable MD Primary Cardiologist: Thompson Grayer MD Reason for Consultation: CHF with LEE.   Clinical Summary Mr. Voeltz is a 83 y.o.male with known history of persistent atrial fibrillation, unable to tolerate anticoagulation therapy in the setting of GI bleed, and anemia, other history includes frequent CVA. followed by Dr. Thompson Grayer in [place], and was not found to be a candidate for anticoagulation as well as watchman device. Other history includes hypertension. The patient apparently does not like to take Lasix regularly as this causes urinary frequency, however he has chronic lower extremity edema which is of concern to him. He also has not been wearing his support hose as directed. He also has not restrict his salt intake. He was last seen by cardiology on 08/22/2016, heart rate was well-controlled, but continued to have chronic lower extremity edema.  He presented to the emergency room with complaints of peripheral edema.His wife reports that he has had worsening lower extremity edema with pain cramping and numbness for approximately 3 weeks. She states that he got to the point where he was unable to walk due to the amount of edema. He also complains of chronic lower back pain and bilateral hip pain. Has remained fairly sedentary. The patient does admit to not restricting salt, states that he cannot eat food without it, and often eats foods that are already pre-salted. The patient's wife reports that they were seen by the primary care physician and was taken off of Lasix, and started on torsemide 40 mg daily. They found no improvement in fluid retention, and therefore chose to be seen in ER.  On arrival in the emergency room patient's blood pressure  was 106/44, heart rate 58 bpm, O2 sat 93%, he was afebrile. Troponin has been negative X1. The patient was found to be anemic with a hemoglobin of 10.6, hematocrit 35.1, platelets 243. (This is actually improved since most recent CBC drawn on 04/25/2016 with a hemoglobin of 9.3, hematocrit of 36.1). BNP 69.0. Creatinine 1.35. Chest x-ray revealed borderline cardiomegaly, with mild interstitial edema. EkG was not completed. We are asked for assistance in fluid management. The patient is currently on Lasix 60 mg IV every 12 hours. He is diuresed 1.7 L since admission with a -1.460 I&O. He continues to have significant lower extremity edema bilaterally with erythema.   Allergies  Allergen Reactions  . Contrast Media [Iodinated Diagnostic Agents]     Pt states he was given "dye" 20 yrs ago, anaphylaxis & syncope - was told to never have it again   . Gabapentin Itching  . Aspirin Other (See Comments)    Blistering   . Latex Rash  . Tape Rash    EKG leads    Medications Scheduled Medications: . atorvastatin  80 mg Oral Daily  . clopidogrel  75 mg Oral Daily  . enoxaparin (LOVENOX) injection  40 mg Subcutaneous Q24H  . furosemide  60 mg Intravenous Q12H  . losartan  100 mg Oral Daily  . meclizine  25 mg Oral QHS  . metolazone  2.5 mg Oral BID  . pantoprazole  40 mg Oral Daily  . potassium chloride  20 mEq Oral BID  . sodium chloride flush  3 mL Intravenous Q12H     Infusions:   PRN Medications:  sodium chloride, acetaminophen, diazepam,  HYDROcodone-acetaminophen, ondansetron (ZOFRAN) IV, sodium chloride flush   Past Medical History:  Diagnosis Date  . Anemia    04/2012: H&H-10.2/31.5, MCV-77; 06/2012:12/38.9  . Arthritis   . Benign prostatic hypertrophy    s/p transurethral resection of the prostate  . Chest pain    2004; with dyspnea, stress nuclear-normal EF + questionable inferior wall ischemia, normal coronary angiography;  2010-negative stress nuclear  . CVA (cerebral  infarction)    asymptomatic (seen on CT)  . Diarrhea    h/o Hemoccult-positive stool  . Dysrhythmia   . Edema of both legs   . GERD (gastroesophageal reflux disease)   . History of blood transfusion   . History of kidney stones   . History of urinary tract infection   . Hypertension   . Jerking movements of extremities    left arm   . Low back pain   . Obstructive sleep apnea    pt states can not use the CPAP  . Peripheral neuropathy (HCC)    lower legs and feet bilat   . Persistent atrial fibrillation ([place]) 2013   Asymptomatic; diagnosed in 05/2012  . Pneumonia   . Prostate cancer ([place]) 2017   stage 4  . Shortness of breath dyspnea    pt states only develops SOB if tries to do something too quickly  . Upper GI bleed 1985   1985-peptic ulcer disease; initial hemigastrectomy; subsequent Billroth II  . Urinary hesitancy   . Vertigo    ED evaluation-06/2012    Past Surgical History:  Procedure Laterality Date  . Billroth II    . CHOLECYSTECTOMY    . COLONOSCOPY  Approximately 2000  . COLONOSCOPY  10/2012   Colonoscopy March 2014 at [facility], difficulty exam requiring fluoroscopy and overtube. Patient developed severe bradycardia again during his procedure like he did [facility]. Multiple polyps removed which were tubular adenomas. Previously tattooed sites at 20 and 30 cm were free of any polypoid change. Left-sided diverticulosis noted.  . COLONOSCOPY WITH ESOPHAGOGASTRODUODENOSCOPY (EGD)  06-17-2009   ACZ:YSAYTKZS tubular adenomas and 2 tubulovillous adenomas, incomplete, 3 clips placed  . ESOPHAGOGASTRODUODENOSCOPY  06/17/2009   SLF: normal/chronic gastritis (non-h pylori)  . ESOPHAGOGASTRODUODENOSCOPY N/A 05/11/2015   WFU:XNATFTD anemai due to postgastrectomy state/moderate erosive gastritis  . INGUINAL HERNIA REPAIR     Left  . ROTATOR CUFF REPAIR  1991   Bilateral  . TRANSURETHRAL RESECTION OF PROSTATE  06/2016  . TRANSURETHRAL RESECTION OF PROSTATE N/A 12/27/2012    Procedure: TRANSURETHRAL RESECTION OF THE PROSTATE (TURP);  Surgeon: Marissa Nestle, MD;  Location: [facility];  Service: Urology;  Laterality: N/A;  . TRANSURETHRAL RESECTION OF PROSTATE N/A 04/11/2016   Procedure: TRANSURETHRAL RESECTION OF THE PROSTATE (TURP);  Surgeon: Irine Seal, MD;  Location: [facility];  Service: Urology;  Laterality: N/A;  . VAGOTOMY AND PYLOROPLASTY  1970s   Details of procedure uncertain; 3220U    Family History  Problem Relation Age of Onset  . Diabetes Mellitus II Mother   . Allergic rhinitis Mother   . Lung disease Father   . Allergic rhinitis Father   . Angioedema Neg Hx   . Asthma Neg Hx   . Atopy Neg Hx   . Eczema Neg Hx   . Immunodeficiency Neg Hx   . Urticaria Neg Hx   . Cystic fibrosis Neg Hx   . Lupus Neg Hx   . Emphysema Neg Hx   . Migraines Neg Hx      Social History  Mr. Witucki reports that he quit smoking about 13 years ago. His smoking use included Cigars. He quit after 12.00 years of use. He has never used smokeless tobacco. Mr. Decarlo reports that he does not drink alcohol.  Review of Systems Complete review of systems are found to be negative unless outlined in H&P above.  Physical Examination Blood pressure (!) 88/47, pulse 93, temperature 98.7 F (37.1 C), temperature source Oral, resp. rate 18, height 6' (1.829 m), weight 266 lb 1.5 oz (120.7 kg), SpO2 93 %.  Intake/Output Summary (Last 24 hours) at 10/09/16 0919 Last data filed at 10/09/16 4782  Gross per 24 hour  Intake              240 ml  Output             1500 ml  Net            -1260 ml    Telemetry:  GEN: Stoic, but responsive. Complaining of cramping in his hands.  HEENT: Conjunctiva and lids normal, oropharynx clear with moist mucosa. Neck: Supple, no elevated JVP or carotid bruits, no thyromegaly. Lungs: Clear to auscultation, nonlabored breathing at rest. Cardiac: Irregular rate and rhythm, no S3 or significant systolic murmur, no pericardial rub. Abdomen:  Soft, non tender non-distended, no hepatomegaly, bowel sounds present, no guarding or rebound. Extremities: Bilateral 2+ pitting edema pretibial to the knees, with significant erythema. Pulses are diminished to palpation. Skin: Warm and dry. Musculoskeletal: No kyphosis. Neuropsychiatric: Alert and oriented x3, affect flat, stoic  Prior Cardiac Testing/Procedures Myoview Study Highlights from 09/2015   Nuclear stress EF: 55%.  There was no ST segment deviation noted during stress.  There is a small defect of mild severity present in the basal inferior and basal inferolateral location. The defect is non-reversible and consistent with diaphragmatic attenuation and extra cardiac activity. No ischemia noted.  This is a low risk study.  The left ventricular ejection fraction is normal (55-65%).    Echocardiogram 10/08/2016 Left ventricle: The cavity size was normal. Wall thickness was   increased in a pattern of mild LVH. Systolic function was normal.   The estimated ejection fraction was in the range of 60% to 65%.   Images were inadequate for LV wall motion assessment. The study   was not technically sufficient to allow evaluation of LV   diastolic dysfunction due to atrial fibrillation. - Aortic valve: Moderately calcified annulus. Moderately thickened,   moderately calcified leaflets. Morphologically, there appears to   be at least mild aortic stenosis. - Mitral valve: Calcified annulus. Mildly thickened leaflets . - Left atrium: The atrium was moderately dilated. - Right ventricle: The cavity size was moderately dilated. Systolic   function was mildly to moderately reduced. - Right atrium: The atrium was moderately dilated. - Tricuspid valve: There was mild regurgitation. - Pulmonary arteries: PA peak pressure: 46 mm Hg (S). - Systemic veins: Dilated IVC with normal respiratory variation.   Estimated CVP 8 mmHg.  Lab Results  Basic Metabolic Panel:  Recent Labs Lab  10/08/16 0018 10/09/16 0527  NA 140 137  K 3.9 3.9  CL 98* 95*  CO2 33* 33*  GLUCOSE 104* 134*  BUN 24* 26*  CREATININE 1.35* 1.47*  CALCIUM 9.2 9.1     CBC:  Recent Labs Lab 10/08/16 0018 10/09/16 0527  WBC 7.3 7.0  NEUTROABS 5.3 4.5  HGB 10.6* 11.1*  HCT 35.1* 35.8*  MCV 78.3 77.5*  PLT 243 251    Cardiac  Enzymes:  Recent Labs Lab 10/08/16 0357  TROPONINI <0.03    Radiology: Dg Chest 2 View  Result Date: 10/07/2016 CLINICAL DATA:  Peripheral edema of both lower extremities. EXAM: CHEST  2 VIEW COMPARISON:  06/24/2012 CXR Heart is top-normal in size. There is mild elevation of the right hemidiaphragm, chronic in appearance. There is aortic atherosclerosis without aneurysm. Mild interstitial edema is noted with minimal atelectasis in the left mid lung and left lung base. No pleural effusion. Degenerative change along the dorsal spine without acute osseous abnormalities. Surgical clips are seen in the right upper quadrant of the abdomen likely from prior cholecystectomy. IMPRESSION: Borderline cardiomegaly with aortic atherosclerosis. Mild interstitial edema. Electronically Signed   By: Ashley Royalty M.D.   On: 10/07/2016 23:59   US Venous Img Lower Bilateral  Result Date: 10/08/2016 CLINICAL DATA:  Patient with bilateral lower extremity edema and pain. EXAM: BILATERAL LOWER EXTREMITY VENOUS DOPPLER ULTRASOUND TECHNIQUE: Gray-scale sonography with graded compression, as well as color Doppler and duplex ultrasound were performed to evaluate the lower extremity deep venous systems from the level of the common femoral vein and including the common femoral, femoral, profunda femoral, popliteal and calf veins including the posterior tibial, peroneal and gastrocnemius veins when visible. The superficial great saphenous vein was also interrogated. Spectral Doppler was utilized to evaluate flow at rest and with distal augmentation maneuvers in the common femoral, femoral and popliteal  veins. COMPARISON:  None. FINDINGS: RIGHT LOWER EXTREMITY Common Femoral Vein: No evidence of thrombus. Normal compressibility, respiratory phasicity and response to augmentation. Saphenofemoral Junction: No evidence of thrombus. Normal compressibility and flow on color Doppler imaging. Profunda Femoral Vein: No evidence of thrombus. Normal compressibility and flow on color Doppler imaging. Femoral Vein: No evidence of thrombus. Normal compressibility, respiratory phasicity and response to augmentation. Popliteal Vein: No evidence of thrombus. Normal compressibility, respiratory phasicity and response to augmentation. Calf Veins: No evidence of thrombus. Normal compressibility and flow on color Doppler imaging. Superficial Great Saphenous Vein: No evidence of thrombus. Normal compressibility and flow on color Doppler imaging. Venous Reflux:  None. Other Findings:  Subcutaneous edema. LEFT LOWER EXTREMITY Common Femoral Vein: No evidence of thrombus. Normal compressibility, respiratory phasicity and response to augmentation. Saphenofemoral Junction: No evidence of thrombus. Normal compressibility and flow on color Doppler imaging. Profunda Femoral Vein: No evidence of thrombus. Normal compressibility and flow on color Doppler imaging. Femoral Vein: No evidence of thrombus. Normal compressibility, respiratory phasicity and response to augmentation. Popliteal Vein: No evidence of thrombus. Normal compressibility, respiratory phasicity and response to augmentation. Calf Veins: No evidence of thrombus. Normal compressibility and flow on color Doppler imaging. Superficial Great Saphenous Vein: No evidence of thrombus. Normal compressibility and flow on color Doppler imaging. Venous Reflux:  None. Other Findings:  Subcutaneous edema. IMPRESSION: No evidence of deep venous thrombosis. Electronically Signed   By: Lovey Newcomer M.D.   On: 10/08/2016 14:23     ECG: Pending.   Impression and Recommendations  1.Acute on  Chronic Diastolic CHF: Echo demonstrated normal LV fx but inadequate images for diastolic function in the setting of atrial fib. He is now on IV lasix with good response so far. Creatinine 1.4. Now started on metolazone 2.5 mg BID. Marland Kitchen Anemia may be contributing. No evidence of DVT on ultrasound of LE.  Does not restrict salt and is sometimes inconsistent in use of diuretics at home. Continue current diuretic regimen. Will check albumin. Refuses TED hose as they do not fit well. Needs to be  measured. Consider differential diagnosis of lymphedema. Uncertain of dry wt.   2. Atrial fibrillation: Not a candidate for Watchman or anticoagulation due to GI bleeding and iron deficiency anemia. Not on any rate control medications at this time on review of IP/OP labs. Heart rate is controlled.   3. Hx of CVA: On Plavix at home.  Very sedentary.   4. Hypertension:  Continues on losartan. Watching creatinine. May need to adjust dose. Currently BP is soft. Will stop for now.     Signed: Phill Myron. Lawrence NP [place]  10/09/2016, 9:19 AM Co-Sign MD  The patient was seen and examined, and I agree with the history, physical exam, assessment and plan as documented above, with modifications as noted below. Pt with aforementioned history (persistent atrial fibrillation deemed not a suitable anticoagulation candidate) admitted with bilateral leg edema progressive in nature for the past month. Upon record review, he is not always compliant with diuretic regimen nor use of support hose. Leg edema likely multifactorial in etiology. I suspect venous hypertension is playing a role along with some component of diastolic heart failure perhaps. Lymphedema is also in the differential diagnosis. LV systolic function is normal and diastolic parameters could not be assessed as the patient is in atrial fibrillation. Now on metolazone in addition to IV Lasix with reasonable response. BUN and creatinine have climbed a bit so this  will need further monitoring. HR is controlled without AV nodal blocking agents, indicative of conduction system disease. Continue current regimen for now. I do feel he may benefit from a vascular surgery evaluation, although treatment is still likely conservative (diuretics, support hose, and dietary compliance). I would probably start antibiotics for his cellulitis.  I had an extensive discussion with the patient's wife and other family members. It appears he is quite noncompliant with sodium intake, in spite of his wife's efforts.   Kate Sable, MD, [facility]  10/09/2016 3:33 PM

## 2016-10-09 NOTE — Progress Notes (Signed)
Subjective: He says he feels about the same. His legs are a little bit better. Echocardiogram showed good systolic heart function. He has bleeding in his scrotum. No new complaints.  Objective: Vital signs in last 24 hours: Temp:  [97.5 F (36.4 C)-98.7 F (37.1 C)] 98.7 F (37.1 C) (02/19 0626) Pulse Rate:  [64-93] 93 (02/19 0626) Resp:  [18] 18 (02/19 0626) BP: (88-91)/(43-49) 88/47 (02/19 0626) SpO2:  [93 %-95 %] 93 % (02/19 0626) Weight change:     Intake/Output from previous day: 02/18 0701 - 02/19 0700 In: 240 [P.O.:240] Out: 1700 [Urine:1700]  PHYSICAL EXAM General appearance: alert, cooperative, mild distress and morbidly obese Resp: He has some crackles in the bases Cardio: Irregular with no gallop GI: soft, non-tender; bowel sounds normal; no masses,  no organomegaly Extremities: Legs are still swollen but better still has erythema. He has a skin tear on his scrotum that is bleeding. Skin otherwise warm and dry except in the lower extremities.  Lab Results:  Results for orders placed or performed during the hospital encounter of 10/07/16 (from the past 48 hour(s))  CBC with Differential/Platelet     Status: Abnormal   Collection Time: 10/08/16 12:18 AM  Result Value Ref Range   WBC 7.3 4.0 - 10.5 K/uL   RBC 4.48 4.22 - 5.81 MIL/uL   Hemoglobin 10.6 (L) 13.0 - 17.0 g/dL   HCT 35.1 (L) 39.0 - 52.0 %   MCV 78.3 78.0 - 100.0 fL   MCH 23.7 (L) 26.0 - 34.0 pg   MCHC 30.2 30.0 - 36.0 g/dL   RDW 17.3 (H) 11.5 - 15.5 %   Platelets 243 150 - 400 K/uL   Neutrophils Relative % 72 %   Neutro Abs 5.3 1.7 - 7.7 K/uL   Lymphocytes Relative 14 %   Lymphs Abs 1.0 0.7 - 4.0 K/uL   Monocytes Relative 9 %   Monocytes Absolute 0.7 0.1 - 1.0 K/uL   Eosinophils Relative 5 %   Eosinophils Absolute 0.4 0.0 - 0.7 K/uL   Basophils Relative 0 %   Basophils Absolute 0.0 0.0 - 0.1 K/uL  Basic metabolic panel     Status: Abnormal   Collection Time: 10/08/16 12:18 AM  Result Value  Ref Range   Sodium 140 135 - 145 mmol/L   Potassium 3.9 3.5 - 5.1 mmol/L   Chloride 98 (L) 101 - 111 mmol/L   CO2 33 (H) 22 - 32 mmol/L   Glucose, Bld 104 (H) 65 - 99 mg/dL   BUN 24 (H) 6 - 20 mg/dL   Creatinine, Ser 1.35 (H) 0.61 - 1.24 mg/dL   Calcium 9.2 8.9 - 10.3 mg/dL   GFR calc non Af Amer 47 (L) >60 mL/min   GFR calc Af Amer 54 (L) >60 mL/min    Comment: (NOTE) The eGFR has been calculated using the CKD EPI equation. This calculation has not been validated in all clinical situations. eGFR's persistently <60 mL/min signify possible Chronic Kidney Disease.    Anion gap 9 5 - 15  Brain natriuretic peptide     Status: None   Collection Time: 10/08/16 12:18 AM  Result Value Ref Range   B Natriuretic Peptide 69.0 0.0 - 100.0 pg/mL  I-Stat CG4 Lactic Acid, ED     Status: None   Collection Time: 10/08/16 12:40 AM  Result Value Ref Range   Lactic Acid, Venous 1.03 0.5 - 1.9 mmol/L  Troponin I (q 6hr x 3)     Status: None  Collection Time: 10/08/16  3:57 AM  Result Value Ref Range   Troponin I <0.03 <0.03 ng/mL  Basic metabolic panel     Status: Abnormal   Collection Time: 10/09/16  5:27 AM  Result Value Ref Range   Sodium 137 135 - 145 mmol/L   Potassium 3.9 3.5 - 5.1 mmol/L   Chloride 95 (L) 101 - 111 mmol/L   CO2 33 (H) 22 - 32 mmol/L   Glucose, Bld 134 (H) 65 - 99 mg/dL   BUN 26 (H) 6 - 20 mg/dL   Creatinine, Ser 1.47 (H) 0.61 - 1.24 mg/dL   Calcium 9.1 8.9 - 10.3 mg/dL   GFR calc non Af Amer 42 (L) >60 mL/min   GFR calc Af Amer 49 (L) >60 mL/min    Comment: (NOTE) The eGFR has been calculated using the CKD EPI equation. This calculation has not been validated in all clinical situations. eGFR's persistently <60 mL/min signify possible Chronic Kidney Disease.    Anion gap 9 5 - 15  CBC with Differential/Platelet     Status: Abnormal   Collection Time: 10/09/16  5:27 AM  Result Value Ref Range   WBC 7.0 4.0 - 10.5 K/uL   RBC 4.62 4.22 - 5.81 MIL/uL   Hemoglobin  11.1 (L) 13.0 - 17.0 g/dL   HCT 35.8 (L) 39.0 - 52.0 %   MCV 77.5 (L) 78.0 - 100.0 fL   MCH 24.0 (L) 26.0 - 34.0 pg   MCHC 31.0 30.0 - 36.0 g/dL   RDW 17.2 (H) 11.5 - 15.5 %   Platelets 251 150 - 400 K/uL   Neutrophils Relative % 64 %   Neutro Abs 4.5 1.7 - 7.7 K/uL   Lymphocytes Relative 19 %   Lymphs Abs 1.3 0.7 - 4.0 K/uL   Monocytes Relative 11 %   Monocytes Absolute 0.8 0.1 - 1.0 K/uL   Eosinophils Relative 6 %   Eosinophils Absolute 0.4 0.0 - 0.7 K/uL   Basophils Relative 0 %   Basophils Absolute 0.0 0.0 - 0.1 K/uL    ABGS No results for input(s): PHART, PO2ART, TCO2, HCO3 in the last 72 hours.  Invalid input(s): PCO2 CULTURES No results found for this or any previous visit (from the past 240 hour(s)). Studies/Results: Dg Chest 2 View  Result Date: 10/07/2016 CLINICAL DATA:  Peripheral edema of both lower extremities. EXAM: CHEST  2 VIEW COMPARISON:  06/24/2012 CXR Heart is top-normal in size. There is mild elevation of the right hemidiaphragm, chronic in appearance. There is aortic atherosclerosis without aneurysm. Mild interstitial edema is noted with minimal atelectasis in the left mid lung and left lung base. No pleural effusion. Degenerative change along the dorsal spine without acute osseous abnormalities. Surgical clips are seen in the right upper quadrant of the abdomen likely from prior cholecystectomy. IMPRESSION: Borderline cardiomegaly with aortic atherosclerosis. Mild interstitial edema. Electronically Signed   By: Ashley Royalty M.D.   On: 10/07/2016 23:59   US Venous Img Lower Bilateral  Result Date: 10/08/2016 CLINICAL DATA:  Patient with bilateral lower extremity edema and pain. EXAM: BILATERAL LOWER EXTREMITY VENOUS DOPPLER ULTRASOUND TECHNIQUE: Gray-scale sonography with graded compression, as well as color Doppler and duplex ultrasound were performed to evaluate the lower extremity deep venous systems from the level of the common femoral vein and including the  common femoral, femoral, profunda femoral, popliteal and calf veins including the posterior tibial, peroneal and gastrocnemius veins when visible. The superficial great saphenous vein was also interrogated. Spectral  Doppler was utilized to evaluate flow at rest and with distal augmentation maneuvers in the common femoral, femoral and popliteal veins. COMPARISON:  None. FINDINGS: RIGHT LOWER EXTREMITY Common Femoral Vein: No evidence of thrombus. Normal compressibility, respiratory phasicity and response to augmentation. Saphenofemoral Junction: No evidence of thrombus. Normal compressibility and flow on color Doppler imaging. Profunda Femoral Vein: No evidence of thrombus. Normal compressibility and flow on color Doppler imaging. Femoral Vein: No evidence of thrombus. Normal compressibility, respiratory phasicity and response to augmentation. Popliteal Vein: No evidence of thrombus. Normal compressibility, respiratory phasicity and response to augmentation. Calf Veins: No evidence of thrombus. Normal compressibility and flow on color Doppler imaging. Superficial Great Saphenous Vein: No evidence of thrombus. Normal compressibility and flow on color Doppler imaging. Venous Reflux:  None. Other Findings:  Subcutaneous edema. LEFT LOWER EXTREMITY Common Femoral Vein: No evidence of thrombus. Normal compressibility, respiratory phasicity and response to augmentation. Saphenofemoral Junction: No evidence of thrombus. Normal compressibility and flow on color Doppler imaging. Profunda Femoral Vein: No evidence of thrombus. Normal compressibility and flow on color Doppler imaging. Femoral Vein: No evidence of thrombus. Normal compressibility, respiratory phasicity and response to augmentation. Popliteal Vein: No evidence of thrombus. Normal compressibility, respiratory phasicity and response to augmentation. Calf Veins: No evidence of thrombus. Normal compressibility and flow on color Doppler imaging. Superficial Great  Saphenous Vein: No evidence of thrombus. Normal compressibility and flow on color Doppler imaging. Venous Reflux:  None. Other Findings:  Subcutaneous edema. IMPRESSION: No evidence of deep venous thrombosis. Electronically Signed   By: Lovey Newcomer M.D.   On: 10/08/2016 14:23    Medications:  Prior to Admission:  Prescriptions Prior to Admission  Medication Sig Dispense Refill Last Dose  . atorvastatin (LIPITOR) 80 MG tablet Take 80 mg by mouth daily.   10/07/2016 at Unknown time  . clobetasol cream (TEMOVATE) 1.93 % Apply 1 application topically daily as needed.    Past Week at Unknown time  . diazepam (VALIUM) 5 MG tablet Take 1 tablet by mouth twice daily as needed for anxiety  2 10/06/2016 at Unknown time  . HYDROcodone-acetaminophen (NORCO) 10-325 MG tablet TAKE 1 TABLET  FIVE TIMES DAILY AS NEEDED FOR PAIN  0 10/07/2016 at Unknown time  . losartan (COZAAR) 100 MG tablet Take 1 tablet by mouth daily  2 10/07/2016 at Unknown time  . meclizine (ANTIVERT) 25 MG tablet Take 25 mg by mouth at bedtime.    10/06/2016 at Unknown time  . nystatin (MYCOSTATIN/NYSTOP) 100000 UNIT/GM POWD Apply 1 g topically daily as needed (rash).    unknown  . pantoprazole (PROTONIX) 40 MG tablet Take 40 mg by mouth daily.   10/07/2016 at Unknown time  . torsemide (DEMADEX) 100 MG tablet Take 50 mg by mouth 2 (two) times daily. 5 day course starting on 10/04/2016  0 10/07/2016 at Unknown time  . triamcinolone cream (KENALOG) 0.1 % Apply 1 application topically daily as needed (for irritation).    unknown  . clopidogrel (PLAVIX) 75 MG tablet Take 75 mg by mouth daily.  0   . furosemide (LASIX) 40 MG tablet Take 40 mg by mouth daily AS DIRECTED 45 tablet 11 Taking   Scheduled: . atorvastatin  80 mg Oral Daily  . clopidogrel  75 mg Oral Daily  . enoxaparin (LOVENOX) injection  40 mg Subcutaneous Q24H  . furosemide  60 mg Intravenous Q12H  . losartan  100 mg Oral Daily  . meclizine  25 mg Oral QHS  . metolazone  2.5 mg  Oral BID  . pantoprazole  40 mg Oral Daily  . potassium chloride  20 mEq Oral BID  . sodium chloride flush  3 mL Intravenous Q12H   Continuous:  QTT:CNGFRE chloride, acetaminophen, diazepam, HYDROcodone-acetaminophen, ondansetron (ZOFRAN) IV, sodium chloride flush  Assesment: He was admitted with swelling of his legs. He has good systolic heart function and diastolic function could not be assessed. He has been on diuretics which have made little difference. He has been thought to have some venous insufficiency and he is being scheduled to see the vascular surgeons to see if there's anything that can be done about that. Vein study here did not show any clots. He has atrial fib he had been started on anticoagulation and had some bleeding and has not wanted to take anticoagulation since Principal Problem:   Congestive heart failure (CHF) (HCC) Active Problems:   Anemia   Essential hypertension   Atrial fibrillation ([place])   Cellulitis   Acute kidney injury ([place])    Plan: Continue current treatments cardiology consultation underway    LOS: 1 day   Mehgan Santmyer L 10/09/2016, 8:31 AM

## 2016-10-09 NOTE — Progress Notes (Addendum)
OT Cancellation Note  Patient Details Name: Bernard Ramirez MRN: [id number] DOB: Sep 12, 1930   Cancelled Treatment:    Reason Eval/Treat Not Completed: OT screened, no needs identified, will sign off. Pt and wife both present in the room.  Pt just returning from the bathroom.  Pt and wife state that pt is at his baseline level of mobility at this time.  Wife reports that he shuffles his feet at baseline, and wife assists him with ADL's at home.   Ailene Ravel, OTR/L,CBIS  [phone]  10/09/2016, 10:26 AM

## 2016-10-09 NOTE — Progress Notes (Signed)
PT Cancellation Note  Patient Details Name: Bernard Ramirez MRN: [id number] DOB: August 12, 1931   Cancelled Treatment:    Reason Eval/Treat Not Completed: PT screened, no needs identified, will sign off (Pt and wife both present in the room.  Pt just returning from the bathroom.  Pt and wife state that pt is at his baseline level of mobility at this time.  Wife reports that he shuffles his feet at baseline, and wife assists him with ADL's at home.  Therefore, no skilled PT needs at this time, PT will sign off.  Encouraged pt to increase ambualtion in the hallway to maintain strength.  Both pt and wife agreeable and understanding.)   Beth Milad Bublitz, PT, DPT X: [phone]

## 2016-10-10 ENCOUNTER — Encounter: Payer: Self-pay | Admitting: Vascular Surgery

## 2016-10-10 DIAGNOSIS — I5033 Acute on chronic diastolic (congestive) heart failure: Secondary | ICD-10-CM

## 2016-10-10 DIAGNOSIS — I482 Chronic atrial fibrillation: Secondary | ICD-10-CM

## 2016-10-10 LAB — BASIC METABOLIC PANEL
Anion gap: 12 (ref 5–15)
BUN: 34 mg/dL — AB (ref 6–20)
CO2: 32 mmol/L (ref 22–32)
CREATININE: 1.57 mg/dL — AB (ref 0.61–1.24)
Calcium: 9.3 mg/dL (ref 8.9–10.3)
Chloride: 93 mmol/L — ABNORMAL LOW (ref 101–111)
GFR calc Af Amer: 45 mL/min — ABNORMAL LOW (ref >60)
GFR calc non Af Amer: 39 mL/min — ABNORMAL LOW (ref >60)
Glucose, Bld: 132 mg/dL — ABNORMAL HIGH (ref 65–99)
Potassium: 3.9 mmol/L (ref 3.5–5.1)
Sodium: 137 mmol/L (ref 135–145)

## 2016-10-10 MED ORDER — CEFAZOLIN SODIUM-DEXTROSE 2-4 GM/100ML-% IV SOLN
2.0000 g | Freq: Three times a day (TID) | INTRAVENOUS | Status: DC
  Administered 2016-10-10 – 2016-10-12 (×7): 2 g via INTRAVENOUS
  Filled 2016-10-10 (×11): qty 100

## 2016-10-10 MED ORDER — METOLAZONE 5 MG PO TABS
2.5000 mg | ORAL_TABLET | Freq: Every day | ORAL | Status: DC
  Administered 2016-10-11 – 2016-10-12 (×2): 2.5 mg via ORAL
  Filled 2016-10-10 (×2): qty 1

## 2016-10-10 NOTE — Plan of Care (Signed)
Problem: Food- and Nutrition-Related Knowledge Deficit (NB-1.1) Goal: Nutrition education Formal process to instruct or train a patient/client in a skill or to impart knowledge to help patients/clients voluntarily manage or modify food choices and eating behavior to maintain or improve health. Outcome: Not Applicable Date Met: 53/96/72 Nutrition Education Note  From patient report, pt is easily consuming 3000-5000 mg/sodium. He liberally uses the salt shaker on just about all foods including apples, beer and ice cream. Dietary habits are no doubt playing major role in his CHF complications. Does verbalize to change, however given overall dependence on sodium, ability to comply with low sodium diet long term is questionable.   RD consulted for nutrition education regarding new onset CHF.  RD provided "Low Sodium Nutrition Therapy" handout from the [employer]. Reviewed patient's dietary recall. Provided examples on ways to decrease sodium intake in diet. Discouraged intake of processed foods and use of salt shaker.  RD discussed why it is important for patient to adhere to diet recommendations, and emphasized the role of fluids, foods to avoid, and importance of weighing self daily. Teach back method used.  Expect fair compliance. Pt's wife at bedside and states she does most of the cooking, states she is willing to make changes in the food she prepares. Pt and Pt's wife expressed interest in topic of low sodium foods, asked appropriate questions, and pt voiced wanting to change. However, pt puts salt on every food (including on apples and beer) also taking into consideration his age, 83 y.o., it is difficult to make changes.   Summary of Recommendations 1. Make homemade made soup instead of canned soup 2. Read labels and take into account portion size. Goal of <2000 mg/day 3. Use/try salt substitute in place of salt shaker 4. Weigh self daily.  5. Stop salting apples, beer  and placing salts snacks on ice cream.    Body mass index is 34.92 kg/m. Pt meets criteria for obesity based on current BMI.  Current diet order is Heart Healthy, patient is consuming approximately 50-75% of meals at this time. Labs and medications reviewed. No further nutrition interventions warranted at this time. RD contact information provided. If additional nutrition issues arise, please re-consult RD.   Burtis Junes RD, LDN, CNSC Clinical Nutrition Pager: [phone] 10/10/2016 11:12 AM

## 2016-10-10 NOTE — Progress Notes (Signed)
Progress Note  Patient Name: Bernard Ramirez Date of Encounter: 10/10/2016  Primary Cardiologist: Thompson Grayer   Subjective   Complaints of skin itching. No pain or dyspnea   Inpatient Medications    Scheduled Meds: . atorvastatin  80 mg Oral Daily  . clopidogrel  75 mg Oral Daily  . enoxaparin (LOVENOX) injection  40 mg Subcutaneous Q24H  . furosemide  60 mg Intravenous Q12H  . meclizine  25 mg Oral QHS  . metolazone  2.5 mg Oral BID  . pantoprazole  40 mg Oral Daily  . potassium chloride  20 mEq Oral BID  . sodium chloride flush  3 mL Intravenous Q12H   Continuous Infusions:  PRN Meds: sodium chloride, acetaminophen, diazepam, hydrocerin, HYDROcodone-acetaminophen, ondansetron (ZOFRAN) IV, sodium chloride flush   Vital Signs    Vitals:   10/09/16 0626 10/09/16 1306 10/09/16 2045 10/10/16 0708  BP: (!) 88/47 (!) 112/52 108/68 121/67  Pulse: 93 80 80 89  Resp: 18 20 18 18   Temp: 98.7 F (37.1 C) 97.9 F (36.6 C) 97.6 F (36.4 C) 100.1 F (37.8 C)  TempSrc: Oral Oral Axillary Oral  SpO2: 93% 95% 96% 95%  Weight:    257 lb 8 oz (116.8 kg)  Height:        Intake/Output Summary (Last 24 hours) at 10/10/16 0735 Last data filed at 10/10/16 0552  Gross per 24 hour  Intake              728 ml  Output             1725 ml  Net             -997 ml   Filed Weights   10/07/16 2044 10/08/16 0545 10/10/16 0708  Weight: 263 lb (119.3 kg) 266 lb 1.5 oz (120.7 kg) 257 lb 8 oz (116.8 kg)    Telemetry    Atrial fib - Personally Reviewed  ECG     Personally Reviewed  Physical Exam   GEN: No acute distress.   Neck: No JVD Cardiac: IRRR, no murmurs, rubs, or gallops.  Respiratory: Clear to auscultation bilaterally. GI: Soft, nontender, non-distended  MS: Bilateral LEE, 2+, R>L with bilateral erythema and warmth,  Psych: Flat affect   Labs    Chemistry Recent Labs Lab 10/08/16 0018 10/09/16 0527 10/09/16 1204 10/10/16 0404  NA 140 137  --  137  K  3.9 3.9  --  3.9  CL 98* 95*  --  93*  CO2 33* 33*  --  32  GLUCOSE 104* 134*  --  132*  BUN 24* 26*  --  34*  CREATININE 1.35* 1.47*  --  1.57*  CALCIUM 9.2 9.1  --  9.3  ALBUMIN  --   --  3.8  --   GFRNONAA 47* 42*  --  39*  GFRAA 54* 49*  --  45*  ANIONGAP 9 9  --  12     Hematology Recent Labs Lab 10/08/16 0018 10/09/16 0527 10/09/16 1204  WBC 7.3 7.0  --   RBC 4.48 4.62 5.03  HGB 10.6* 11.1*  --   HCT 35.1* 35.8*  --   MCV 78.3 77.5*  --   MCH 23.7* 24.0*  --   MCHC 30.2 31.0  --   RDW 17.3* 17.2*  --   PLT 243 251  --     Cardiac Enzymes Recent Labs Lab 10/08/16 0357  TROPONINI <0.03   No results for input(s):  TROPIPOC in the last 168 hours.   BNP Recent Labs Lab 10/08/16 0018  BNP 69.0     Dimer No results for input(s): DDIMER in the last 168 hours.   Radiology    US Venous Img Lower Bilateral  Result Date: 10/08/2016 CLINICAL DATA:  Patient with bilateral lower extremity edema and pain. EXAM: BILATERAL LOWER EXTREMITY VENOUS DOPPLER ULTRASOUND TECHNIQUE: Gray-scale sonography with graded compression, as well as color Doppler and duplex ultrasound were performed to evaluate the lower extremity deep venous systems from the level of the common femoral vein and including the common femoral, femoral, profunda femoral, popliteal and calf veins including the posterior tibial, peroneal and gastrocnemius veins when visible. The superficial great saphenous vein was also interrogated. Spectral Doppler was utilized to evaluate flow at rest and with distal augmentation maneuvers in the common femoral, femoral and popliteal veins. COMPARISON:  None. FINDINGS: RIGHT LOWER EXTREMITY Common Femoral Vein: No evidence of thrombus. Normal compressibility, respiratory phasicity and response to augmentation. Saphenofemoral Junction: No evidence of thrombus. Normal compressibility and flow on color Doppler imaging. Profunda Femoral Vein: No evidence of thrombus. Normal  compressibility and flow on color Doppler imaging. Femoral Vein: No evidence of thrombus. Normal compressibility, respiratory phasicity and response to augmentation. Popliteal Vein: No evidence of thrombus. Normal compressibility, respiratory phasicity and response to augmentation. Calf Veins: No evidence of thrombus. Normal compressibility and flow on color Doppler imaging. Superficial Great Saphenous Vein: No evidence of thrombus. Normal compressibility and flow on color Doppler imaging. Venous Reflux:  None. Other Findings:  Subcutaneous edema. LEFT LOWER EXTREMITY Common Femoral Vein: No evidence of thrombus. Normal compressibility, respiratory phasicity and response to augmentation. Saphenofemoral Junction: No evidence of thrombus. Normal compressibility and flow on color Doppler imaging. Profunda Femoral Vein: No evidence of thrombus. Normal compressibility and flow on color Doppler imaging. Femoral Vein: No evidence of thrombus. Normal compressibility, respiratory phasicity and response to augmentation. Popliteal Vein: No evidence of thrombus. Normal compressibility, respiratory phasicity and response to augmentation. Calf Veins: No evidence of thrombus. Normal compressibility and flow on color Doppler imaging. Superficial Great Saphenous Vein: No evidence of thrombus. Normal compressibility and flow on color Doppler imaging. Venous Reflux:  None. Other Findings:  Subcutaneous edema. IMPRESSION: No evidence of deep venous thrombosis. Electronically Signed   By: Lovey Newcomer M.D.   On: 10/08/2016 14:23    Cardiac Studies   Echocardiogram 10/08/2016 Left ventricle: The cavity size was normal. Wall thickness was   increased in a pattern of mild LVH. Systolic function was normal.   The estimated ejection fraction was in the range of 60% to 65%.   Images were inadequate for LV wall motion assessment. The study   was not technically sufficient to allow evaluation of LV   diastolic dysfunction due to atrial  fibrillation. - Aortic valve: Moderately calcified annulus. Moderately thickened,   moderately calcified leaflets. Morphologically, there appears to   be at least mild aortic stenosis. - Mitral valve: Calcified annulus. Mildly thickened leaflets . - Left atrium: The atrium was moderately dilated. - Right ventricle: The cavity size was moderately dilated. Systolic   function was mildly to moderately reduced. - Right atrium: The atrium was moderately dilated. - Tricuspid valve: There was mild regurgitation. - Pulmonary arteries: PA peak pressure: 46 mm Hg (S). - Systemic veins: Dilated IVC with normal respiratory variation.   Estimated CVP 8 mmHg.  Patient Profile     83 y.o. male admitted with Acute on Chronic CHF, chronic LEE, with  hx of atrial fib, hypertension, CVA.   Assessment & Plan    1. Acute on chronic diastolic heart failure: Presumed diastolic but could not be adequately assess with echo in the setting of atrial fib. He has diuresed  1.2 liters since admission. Wt is down from 263 lbs to 257 lbs. Continues on IV lasix 60 mg BID, and metolazone 2.5 mg BID. Creatinine increasing to 1.57 from 1.47 yesterday. CO2 32. Consider decreasing metolazone to daily from BID. This may need to be adjusted back once he is discharged in the setting of dietary noncompliance.   2. Atrial fib: Heart rate is well controlled currently without rate control medications. Not a candidate for anticoagulation due to GI bleeding and anemia.   3. Cellulitis of the lower extremities: On Ancef IV. Management per Dr. Luan Pulling.   4. Iron deficiency anemia: Anemia profile reveals low iron saturation and iron levels. Recommend iron replacement.   5. Deconditioning: Consider PT evaluation  Signed, Jory Sims, NP   The patient was seen and examined, and I agree with the history, physical exam, assessment and plan as documented above, with modifications as noted below. Adequate diuresis with creatinine  trending up. Balance of diuretic dosage and kidney function explained to wife yesterday and today. Will reduce metolazone to 2.5 mg daily.  Kate Sable, MD, [facility]  10/10/2016 10:17 AM   10/10/2016, 7:35 AM

## 2016-10-10 NOTE — Progress Notes (Signed)
Subjective: He says he feels a little better. His legs are a little bit less swollen. He had requested a cream to put on his legs but he said that made him worse. He needs instruction on low salt diet.  Objective: Vital signs in last 24 hours: Temp:  [97.6 F (36.4 C)-100.1 F (37.8 C)] 100.1 F (37.8 C) (02/20 0708) Pulse Rate:  [80-89] 89 (02/20 0708) Resp:  [18-20] 18 (02/20 0708) BP: (108-121)/(52-68) 121/67 (02/20 0708) SpO2:  [95 %-96 %] 95 % (02/20 0708) Weight:  [116.8 kg (257 lb 8 oz)] 116.8 kg (257 lb 8 oz) (02/20 0708) Weight change:  Last BM Date: 10/09/16  Intake/Output from previous day: 02/19 0701 - 02/20 0700 In: 728 [P.O.:720; I.V.:8] Out: 1725 [Urine:1725]  PHYSICAL EXAM General appearance: alert, cooperative and no distress Resp: clear to auscultation bilaterally Cardio: irregularly irregular rhythm GI: He still has swelling of his legs and erythema but it's better. Extremities: He still has swelling and erythema of his legs but it's better. His abdomen is soft with no masses Pupils react. Mucous membranes are moist  Lab Results:  Results for orders placed or performed during the hospital encounter of 10/07/16 (from the past 48 hour(s))  Basic metabolic panel     Status: Abnormal   Collection Time: 10/09/16  5:27 AM  Result Value Ref Range   Sodium 137 135 - 145 mmol/L   Potassium 3.9 3.5 - 5.1 mmol/L   Chloride 95 (L) 101 - 111 mmol/L   CO2 33 (H) 22 - 32 mmol/L   Glucose, Bld 134 (H) 65 - 99 mg/dL   BUN 26 (H) 6 - 20 mg/dL   Creatinine, Ser 1.47 (H) 0.61 - 1.24 mg/dL   Calcium 9.1 8.9 - 10.3 mg/dL   GFR calc non Af Amer 42 (L) >60 mL/min   GFR calc Af Amer 49 (L) >60 mL/min    Comment: (NOTE) The eGFR has been calculated using the CKD EPI equation. This calculation has not been validated in all clinical situations. eGFR's persistently <60 mL/min signify possible Chronic Kidney Disease.    Anion gap 9 5 - 15  CBC with Differential/Platelet      Status: Abnormal   Collection Time: 10/09/16  5:27 AM  Result Value Ref Range   WBC 7.0 4.0 - 10.5 K/uL   RBC 4.62 4.22 - 5.81 MIL/uL   Hemoglobin 11.1 (L) 13.0 - 17.0 g/dL   HCT 35.8 (L) 39.0 - 52.0 %   MCV 77.5 (L) 78.0 - 100.0 fL   MCH 24.0 (L) 26.0 - 34.0 pg   MCHC 31.0 30.0 - 36.0 g/dL   RDW 17.2 (H) 11.5 - 15.5 %   Platelets 251 150 - 400 K/uL   Neutrophils Relative % 64 %   Neutro Abs 4.5 1.7 - 7.7 K/uL   Lymphocytes Relative 19 %   Lymphs Abs 1.3 0.7 - 4.0 K/uL   Monocytes Relative 11 %   Monocytes Absolute 0.8 0.1 - 1.0 K/uL   Eosinophils Relative 6 %   Eosinophils Absolute 0.4 0.0 - 0.7 K/uL   Basophils Relative 0 %   Basophils Absolute 0.0 0.0 - 0.1 K/uL  Albumin     Status: None   Collection Time: 10/09/16 12:04 PM  Result Value Ref Range   Albumin 3.8 3.5 - 5.0 g/dL  Vitamin B12     Status: Abnormal   Collection Time: 10/09/16 12:04 PM  Result Value Ref Range   Vitamin B-12 111 (L) 180 -  914 pg/mL    Comment: (NOTE) This assay is not validated for testing neonatal or myeloproliferative syndrome specimens for Vitamin B12 levels. Performed at [facility], [place] [address] [place], [place] [address]   Folate     Status: None   Collection Time: 10/09/16 12:04 PM  Result Value Ref Range   Folate 21.2 >5.9 ng/mL    Comment: Performed at [facility], [place] [address], [place], [place] [address]  Iron and TIBC     Status: Abnormal   Collection Time: 10/09/16 12:04 PM  Result Value Ref Range   Iron 38 (L) 45 - 182 ug/dL   TIBC 559 (H) 250 - 450 ug/dL   Saturation Ratios 7 (L) 17.9 - 39.5 %   UIBC 521 ug/dL    Comment: Performed at [facility], [place] [address], [place], [place] [address]  Ferritin     Status: Abnormal   Collection Time: 10/09/16 12:04 PM  Result Value Ref Range   Ferritin 12 (L) 24 - 336 ng/mL    Comment: Performed at [facility], [place] [address] [place], [place] [address]  Reticulocytes     Status: None   Collection  Time: 10/09/16 12:04 PM  Result Value Ref Range   Retic Ct Pct 1.1 0.4 - 3.1 %   RBC. 5.03 4.22 - 5.81 MIL/uL   Retic Count, Manual 55.3 19.0 - 186.0 K/uL  Basic metabolic panel     Status: Abnormal   Collection Time: 10/10/16  4:04 AM  Result Value Ref Range   Sodium 137 135 - 145 mmol/L   Potassium 3.9 3.5 - 5.1 mmol/L   Chloride 93 (L) 101 - 111 mmol/L   CO2 32 22 - 32 mmol/L   Glucose, Bld 132 (H) 65 - 99 mg/dL   BUN 34 (H) 6 - 20 mg/dL   Creatinine, Ser 1.57 (H) 0.61 - 1.24 mg/dL   Calcium 9.3 8.9 - 10.3 mg/dL   GFR calc non Af Amer 39 (L) >60 mL/min   GFR calc Af Amer 45 (L) >60 mL/min    Comment: (NOTE) The eGFR has been calculated using the CKD EPI equation. This calculation has not been validated in all clinical situations. eGFR's persistently <60 mL/min signify possible Chronic Kidney Disease.    Anion gap 12 5 - 15    ABGS No results for input(s): PHART, PO2ART, TCO2, HCO3 in the last 72 hours.  Invalid input(s): PCO2 CULTURES No results found for this or any previous visit (from the past 240 hour(s)). Studies/Results: US Venous Img Lower Bilateral  Result Date: 10/08/2016 CLINICAL DATA:  Patient with bilateral lower extremity edema and pain. EXAM: BILATERAL LOWER EXTREMITY VENOUS DOPPLER ULTRASOUND TECHNIQUE: Gray-scale sonography with graded compression, as well as color Doppler and duplex ultrasound were performed to evaluate the lower extremity deep venous systems from the level of the common femoral vein and including the common femoral, femoral, profunda femoral, popliteal and calf veins including the posterior tibial, peroneal and gastrocnemius veins when visible. The superficial great saphenous vein was also interrogated. Spectral Doppler was utilized to evaluate flow at rest and with distal augmentation maneuvers in the common femoral, femoral and popliteal veins. COMPARISON:  None. FINDINGS: RIGHT LOWER EXTREMITY Common Femoral Vein: No evidence of thrombus.  Normal compressibility, respiratory phasicity and response to augmentation. Saphenofemoral Junction: No evidence of thrombus. Normal compressibility and flow on color Doppler imaging. Profunda Femoral Vein: No evidence of thrombus. Normal compressibility and flow  on color Doppler imaging. Femoral Vein: No evidence of thrombus. Normal compressibility, respiratory phasicity and response to augmentation. Popliteal Vein: No evidence of thrombus. Normal compressibility, respiratory phasicity and response to augmentation. Calf Veins: No evidence of thrombus. Normal compressibility and flow on color Doppler imaging. Superficial Great Saphenous Vein: No evidence of thrombus. Normal compressibility and flow on color Doppler imaging. Venous Reflux:  None. Other Findings:  Subcutaneous edema. LEFT LOWER EXTREMITY Common Femoral Vein: No evidence of thrombus. Normal compressibility, respiratory phasicity and response to augmentation. Saphenofemoral Junction: No evidence of thrombus. Normal compressibility and flow on color Doppler imaging. Profunda Femoral Vein: No evidence of thrombus. Normal compressibility and flow on color Doppler imaging. Femoral Vein: No evidence of thrombus. Normal compressibility, respiratory phasicity and response to augmentation. Popliteal Vein: No evidence of thrombus. Normal compressibility, respiratory phasicity and response to augmentation. Calf Veins: No evidence of thrombus. Normal compressibility and flow on color Doppler imaging. Superficial Great Saphenous Vein: No evidence of thrombus. Normal compressibility and flow on color Doppler imaging. Venous Reflux:  None. Other Findings:  Subcutaneous edema. IMPRESSION: No evidence of deep venous thrombosis. Electronically Signed   By: Lovey Newcomer M.D.   On: 10/08/2016 14:23    Medications:  Prior to Admission:  Prescriptions Prior to Admission  Medication Sig Dispense Refill Last Dose  . atorvastatin (LIPITOR) 80 MG tablet Take 80 mg by  mouth daily.   10/07/2016 at Unknown time  . clobetasol cream (TEMOVATE) 1.24 % Apply 1 application topically daily as needed.    Past Week at Unknown time  . diazepam (VALIUM) 5 MG tablet Take 1 tablet by mouth twice daily as needed for anxiety  2 10/06/2016 at Unknown time  . HYDROcodone-acetaminophen (NORCO) 10-325 MG tablet TAKE 1 TABLET  FIVE TIMES DAILY AS NEEDED FOR PAIN  0 10/07/2016 at Unknown time  . losartan (COZAAR) 100 MG tablet Take 1 tablet by mouth daily  2 10/07/2016 at Unknown time  . meclizine (ANTIVERT) 25 MG tablet Take 25 mg by mouth at bedtime.    10/06/2016 at Unknown time  . nystatin (MYCOSTATIN/NYSTOP) 100000 UNIT/GM POWD Apply 1 g topically daily as needed (rash).    unknown  . pantoprazole (PROTONIX) 40 MG tablet Take 40 mg by mouth daily.   10/07/2016 at Unknown time  . torsemide (DEMADEX) 100 MG tablet Take 50 mg by mouth 2 (two) times daily. 5 day course starting on 10/04/2016  0 10/07/2016 at Unknown time  . triamcinolone cream (KENALOG) 0.1 % Apply 1 application topically daily as needed (for irritation).    unknown  . clopidogrel (PLAVIX) 75 MG tablet Take 75 mg by mouth daily.  0   . furosemide (LASIX) 40 MG tablet Take 40 mg by mouth daily AS DIRECTED 45 tablet 11 Taking   Scheduled: . atorvastatin  80 mg Oral Daily  .  ceFAZolin (ANCEF) IV  2 g Intravenous Q8H  . clopidogrel  75 mg Oral Daily  . enoxaparin (LOVENOX) injection  40 mg Subcutaneous Q24H  . furosemide  60 mg Intravenous Q12H  . meclizine  25 mg Oral QHS  . metolazone  2.5 mg Oral BID  . pantoprazole  40 mg Oral Daily  . potassium chloride  20 mEq Oral BID  . sodium chloride flush  3 mL Intravenous Q12H   Continuous:  PYK:DXIPJA chloride, acetaminophen, diazepam, hydrocerin, HYDROcodone-acetaminophen, ondansetron (ZOFRAN) IV, sodium chloride flush  Assesment: He was admitted with congestive heart failure. He has cellulitis of his legs. He was  on vancomycin but that has been discontinued so he  will start Ancef. He has what appears to be iron deficiency anemia that I think can be worked up as an outpatient. His kidney function is not quite as good but he still has significant edema. Part of the problems he is not following a diet. Principal Problem:   Congestive heart failure (CHF) (HCC) Active Problems:   Anemia   Essential hypertension   Atrial fibrillation (HCC)   Cellulitis   Acute kidney injury ([place])    Plan: Start Ancef. Continue current treatments. As for dietitian consultation. Outpatient GI evaluation. Reduce his diuretics slightly    LOS: 2 days   Santana Gosdin L 10/10/2016, 8:43 AM

## 2016-10-11 DIAGNOSIS — L0390 Cellulitis, unspecified: Secondary | ICD-10-CM

## 2016-10-11 LAB — BASIC METABOLIC PANEL
ANION GAP: 13 (ref 5–15)
BUN: 37 mg/dL — AB (ref 6–20)
CO2: 32 mmol/L (ref 22–32)
Calcium: 9.4 mg/dL (ref 8.9–10.3)
Chloride: 90 mmol/L — ABNORMAL LOW (ref 101–111)
Creatinine, Ser: 1.56 mg/dL — ABNORMAL HIGH (ref 0.61–1.24)
GFR calc Af Amer: 46 mL/min — ABNORMAL LOW (ref >60)
GFR, EST NON AFRICAN AMERICAN: 39 mL/min — AB (ref >60)
Glucose, Bld: 138 mg/dL — ABNORMAL HIGH (ref 65–99)
POTASSIUM: 3.7 mmol/L (ref 3.5–5.1)
SODIUM: 135 mmol/L (ref 135–145)

## 2016-10-11 MED ORDER — FAMOTIDINE 20 MG PO TABS
20.0000 mg | ORAL_TABLET | Freq: Two times a day (BID) | ORAL | Status: DC
  Administered 2016-10-11 – 2016-10-12 (×3): 20 mg via ORAL
  Filled 2016-10-11 (×3): qty 1

## 2016-10-11 MED ORDER — HYDROXYZINE HCL 25 MG PO TABS
50.0000 mg | ORAL_TABLET | Freq: Four times a day (QID) | ORAL | Status: DC | PRN
  Administered 2016-10-11: 50 mg via ORAL
  Filled 2016-10-11: qty 2

## 2016-10-11 MED ORDER — CYANOCOBALAMIN 1000 MCG/ML IJ SOLN
1000.0000 ug | Freq: Once | INTRAMUSCULAR | Status: AC
  Administered 2016-10-11: 1000 ug via INTRAMUSCULAR
  Filled 2016-10-11: qty 1

## 2016-10-11 MED ORDER — CYANOCOBALAMIN 1000 MCG/ML IJ SOLN
1000.0000 ug | Freq: Once | INTRAMUSCULAR | Status: AC
  Administered 2016-10-12: 1000 ug via INTRAMUSCULAR
  Filled 2016-10-11: qty 1

## 2016-10-11 NOTE — Progress Notes (Signed)
Progress Note  Patient Name: Bernard Ramirez Date of Encounter: 10/11/2016  Primary Cardiologist: Dr. [name]  Subjective   Complains of itching all over. Thinks it's from Lasix  Inpatient Medications    Scheduled Meds: . atorvastatin  80 mg Oral Daily  .  ceFAZolin (ANCEF) IV  2 g Intravenous Q8H  . clopidogrel  75 mg Oral Daily  . enoxaparin (LOVENOX) injection  40 mg Subcutaneous Q24H  . furosemide  60 mg Intravenous Q12H  . meclizine  25 mg Oral QHS  . metolazone  2.5 mg Oral Daily  . pantoprazole  40 mg Oral Daily  . potassium chloride  20 mEq Oral BID  . sodium chloride flush  3 mL Intravenous Q12H   Continuous Infusions:  PRN Meds: sodium chloride, acetaminophen, diazepam, hydrocerin, HYDROcodone-acetaminophen, ondansetron (ZOFRAN) IV, sodium chloride flush   Vital Signs    Vitals:   10/10/16 2004 10/10/16 2312 10/11/16 0500 10/11/16 0526  BP:  (!) 98/59  (!) 109/51  Pulse:  89  90  Resp:  20  18  Temp:  98.6 F (37 C)  98.9 F (37.2 C)  TempSrc:  Oral  Oral  SpO2: 93% 94%  96%  Weight:   255 lb 8.2 oz (115.9 kg)   Height:        Intake/Output Summary (Last 24 hours) at 10/11/16 0840 Last data filed at 10/11/16 0540  Gross per 24 hour  Intake              923 ml  Output             1600 ml  Net             -677 ml   Filed Weights   10/08/16 0545 10/10/16 0708 10/11/16 0500  Weight: 266 lb 1.5 oz (120.7 kg) 257 lb 8 oz (116.8 kg) 255 lb 8.2 oz (115.9 kg)    Telemetry    Afib with controlled rate- Personally Reviewed  ECG      GEN: No acute distress.   Neck: slight JVD Cardiac: irreg irreg, no murmurs, rubs, or gallops.  Respiratory: decreased breath sounds with basilar crackles. GI: Soft, nontender, non-distended  MS: decreasing edema Neuro:  Nonfocal  Psych: Normal affect   Labs    Chemistry Recent Labs Lab 10/09/16 0527 10/09/16 1204 10/10/16 0404 10/11/16 0411  NA 137  --  137 135  K 3.9  --  3.9 3.7  CL 95*  --  93*  90*  CO2 33*  --  32 32  GLUCOSE 134*  --  132* 138*  BUN 26*  --  34* 37*  CREATININE 1.47*  --  1.57* 1.56*  CALCIUM 9.1  --  9.3 9.4  ALBUMIN  --  3.8  --   --   GFRNONAA 42*  --  39* 39*  GFRAA 49*  --  45* 46*  ANIONGAP 9  --  12 13     Hematology Recent Labs Lab 10/08/16 0018 10/09/16 0527 10/09/16 1204  WBC 7.3 7.0  --   RBC 4.48 4.62 5.03  HGB 10.6* 11.1*  --   HCT 35.1* 35.8*  --   MCV 78.3 77.5*  --   MCH 23.7* 24.0*  --   MCHC 30.2 31.0  --   RDW 17.3* 17.2*  --   PLT 243 251  --     Cardiac Enzymes Recent Labs Lab 10/08/16 0357  TROPONINI <0.03   No results for input(s):  TROPIPOC in the last 168 hours.   BNP Recent Labs Lab 10/08/16 0018  BNP 69.0     DDimer No results for input(s): DDIMER in the last 168 hours.   Radiology    No results found.  Cardiac Studies   Echocardiogram 10/08/2016 Left ventricle: The cavity size was normal. Wall thickness was   increased in a pattern of mild LVH. Systolic function was normal.   The estimated ejection fraction was in the range of 60% to 65%.   Images were inadequate for LV wall motion assessment. The study   was not technically sufficient to allow evaluation of LV   diastolic dysfunction due to atrial fibrillation. - Aortic valve: Moderately calcified annulus. Moderately thickened,   moderately calcified leaflets. Morphologically, there appears to   be at least mild aortic stenosis. - Mitral valve: Calcified annulus. Mildly thickened leaflets . - Left atrium: The atrium was moderately dilated. - Right ventricle: The cavity size was moderately dilated. Systolic   function was mildly to moderately reduced. - Right atrium: The atrium was moderately dilated. - Tricuspid valve: There was mild regurgitation. - Pulmonary arteries: PA peak pressure: 46 mm Hg (S). - Systemic veins: Dilated IVC with normal respiratory variation.   Estimated CVP 8 mmHg.     Patient Profile       83 y.o. male admitted  with Acute on Chronic CHF, chronic LEE, with hx of atrial fib, hypertension, CVA.   Assessment & Plan     1. Acute on chronic diastolic heart failure: Presumed diastolic but could not be adequately assess with echo in the setting of atrial fib. He has diuresed  1.9 liters since admission. Wt is down from 263 lbs to 255 lbs. Continues on IV lasix 60 mg BID, and metolazone 2.5 mg daily. Continue IV diuresis.? Itching secondary to IV lasix   2. Atrial fib: Heart rate controlled currently without rate control medications. Not a candidate for anticoagulation due to GI bleeding and anemia.    3. Cellulitis of the lower extremities: On Ancef IV. Management per Dr. Luan Pulling.    4. Iron deficiency anemia:     Signed, Ermalinda Barrios, PA-C  10/11/2016, 8:40 AM    The patient was seen and examined, and I agree with the history, physical exam, assessment and plan as documented above, with modifications as noted below. Leg swelling has improved as has cellulitis. I spoke to the patient and his wife at length. It appears urticaria preceded the use of Lasix so doubt sulfa allergy. However, one could consider switching to spironolactone as outpatient but I am not certain it is a sulfa allergy based on wife's comments. Continue IV Lasix bid and metolazone 2.5 mg daily for now. Should be ready for discharge tomorrow.  Kate Sable, MD, [facility]  10/11/2016 9:55 AM

## 2016-10-11 NOTE — Care Management Note (Signed)
Case Management Note  Patient Details  Name: Bernard Ramirez MRN: [id number] Date of Birth: 09-Apr-1931  Subjective/Objective:                  Pt admitted with CHF. Pt lives with his wife. He is ind with ADL's, uses cane with ambulation. He has PCP, transportation to appointments and no difficulty affording or managing medications. Pt has scale but does not use it daily. He is non-compliant with diet and dietician has been consulted to provide education. Pt and wife uninterested in [facility] services at this time but RN for disease management could be helpful.   Action/Plan: CM will ask MD to encourage [facility] and CM will make referral for [facility].  Anticipate DC home in next 24-48hrs. CM will cont to follow.    Expected Discharge Date:      10/12/2016            Expected Discharge Plan:  Home/Self Care  In-House Referral:  NA  Discharge planning Services  CM Consult  Post Acute Care Choice:  Home Health Choice offered to:  Patient  Status of Service:  In process, will continue to follow Sherald Barge, RN 10/11/2016, 9:14 AM

## 2016-10-11 NOTE — Consult Note (Signed)
   [facility] CM Inpatient Consult   10/11/2016  LATREL SZYMCZAK 03-16-1931 [id number]   [facility] Care Management referral received from inpatient [facility] for disease management for CHF. Chart reviewed and noted patient and wife are not interested in home health services. Telephone call into patient's room. Permission to speak with wife Deuntae Kocsis) as Mr. Appenzeller was bathing. Discussed [facility] Care Management program for CHF disease and symptom management as a benefit of Mr. [facility] insurance. Mrs. Crissman gives verbal consent for [place] [facility] follow up.   Mrs. Schiff states they usually do not answer the phone because they have so many telemarketers and solicitors that call the house. She states if a message is left on the answering machine, she will pick up. Confirmed best contact number as [phone]. Confirmed Primary Care MD is Dr. Luan Pulling. Denies having issues with transportation or medications. However, there is noted non-compliance with daily weights and dietary management.   Since home health may be declined, will request for [facility] be assigned for disease and symptom management for CHF. Left voicemail for inpatient RNCM to make aware that [facility] will follow up post discharge.   Marthenia Rolling, MSN-Ed, RN,BSN [facility] Liaison [phone]

## 2016-10-11 NOTE — Progress Notes (Signed)
Subjective: He says he feels better. His legs are better. He still has some erythema. He had consultation with dietitian and they feel better about their ability to follow a low-salt diet.  Objective: Vital signs in last 24 hours: Temp:  [98.1 F (36.7 C)-98.9 F (37.2 C)] 98.9 F (37.2 C) (02/21 0526) Pulse Rate:  [83-90] 90 (02/21 0526) Resp:  [18-20] 18 (02/21 0526) BP: (98-109)/(51-59) 109/51 (02/21 0526) SpO2:  [93 %-96 %] 96 % (02/21 0526) Weight:  [115.9 kg (255 lb 8.2 oz)] 115.9 kg (255 lb 8.2 oz) (02/21 0500) Weight change:  Last BM Date: 10/10/16  Intake/Output from previous day: 02/20 0701 - 02/21 0700 In: 923 [P.O.:720; I.V.:3; IV Piggyback:200] Out: 1600 [Urine:1600]  PHYSICAL EXAM General appearance: alert, cooperative and no distress Resp: clear to auscultation bilaterally Cardio: irregularly irregular rhythm GI: soft, non-tender; bowel sounds normal; no masses,  no organomegaly Extremities: Much less erythema and edema. Skin warm and dry  Lab Results:  Results for orders placed or performed during the hospital encounter of 10/07/16 (from the past 48 hour(s))  Albumin     Status: None   Collection Time: 10/09/16 12:04 PM  Result Value Ref Range   Albumin 3.8 3.5 - 5.0 g/dL  Vitamin B12     Status: Abnormal   Collection Time: 10/09/16 12:04 PM  Result Value Ref Range   Vitamin B-12 111 (L) 180 - 914 pg/mL    Comment: (NOTE) This assay is not validated for testing neonatal or myeloproliferative syndrome specimens for Vitamin B12 levels. Performed at [facility], [place] [address] [place], [place] [address]   Folate     Status: None   Collection Time: 10/09/16 12:04 PM  Result Value Ref Range   Folate 21.2 >5.9 ng/mL    Comment: Performed at [facility], [place] [address], [place], [place] [address]  Iron and TIBC     Status: Abnormal   Collection Time: 10/09/16 12:04 PM  Result Value Ref Range   Iron 38 (L) 45 - 182 ug/dL   TIBC 559 (H)  250 - 450 ug/dL   Saturation Ratios 7 (L) 17.9 - 39.5 %   UIBC 521 ug/dL    Comment: Performed at [facility], [place] [address], [place], [place] [address]  Ferritin     Status: Abnormal   Collection Time: 10/09/16 12:04 PM  Result Value Ref Range   Ferritin 12 (L) 24 - 336 ng/mL    Comment: Performed at [facility], [place] [address] [place], [place] [address]  Reticulocytes     Status: None   Collection Time: 10/09/16 12:04 PM  Result Value Ref Range   Retic Ct Pct 1.1 0.4 - 3.1 %   RBC. 5.03 4.22 - 5.81 MIL/uL   Retic Count, Manual 55.3 19.0 - 186.0 K/uL  Basic metabolic panel     Status: Abnormal   Collection Time: 10/10/16  4:04 AM  Result Value Ref Range   Sodium 137 135 - 145 mmol/L   Potassium 3.9 3.5 - 5.1 mmol/L   Chloride 93 (L) 101 - 111 mmol/L   CO2 32 22 - 32 mmol/L   Glucose, Bld 132 (H) 65 - 99 mg/dL   BUN 34 (H) 6 - 20 mg/dL   Creatinine, Ser 1.57 (H) 0.61 - 1.24 mg/dL   Calcium 9.3 8.9 - 10.3 mg/dL   GFR calc non Af Amer 39 (L) >60 mL/min   GFR calc Af Amer 45 (  L) >60 mL/min    Comment: (NOTE) The eGFR has been calculated using the CKD EPI equation. This calculation has not been validated in all clinical situations. eGFR's persistently <60 mL/min signify possible Chronic Kidney Disease.    Anion gap 12 5 - 15  Basic metabolic panel     Status: Abnormal   Collection Time: 10/11/16  4:11 AM  Result Value Ref Range   Sodium 135 135 - 145 mmol/L   Potassium 3.7 3.5 - 5.1 mmol/L   Chloride 90 (L) 101 - 111 mmol/L   CO2 32 22 - 32 mmol/L   Glucose, Bld 138 (H) 65 - 99 mg/dL   BUN 37 (H) 6 - 20 mg/dL   Creatinine, Ser 1.56 (H) 0.61 - 1.24 mg/dL   Calcium 9.4 8.9 - 10.3 mg/dL   GFR calc non Af Amer 39 (L) >60 mL/min   GFR calc Af Amer 46 (L) >60 mL/min    Comment: (NOTE) The eGFR has been calculated using the CKD EPI equation. This calculation has not been validated in all clinical situations. eGFR's persistently <60 mL/min signify possible  Chronic Kidney Disease.    Anion gap 13 5 - 15    ABGS No results for input(s): PHART, PO2ART, TCO2, HCO3 in the last 72 hours.  Invalid input(s): PCO2 CULTURES No results found for this or any previous visit (from the past 240 hour(s)). Studies/Results: No results found.  Medications:  Prior to Admission:  Prescriptions Prior to Admission  Medication Sig Dispense Refill Last Dose  . atorvastatin (LIPITOR) 80 MG tablet Take 80 mg by mouth daily.   10/07/2016 at Unknown time  . clobetasol cream (TEMOVATE) 7.67 % Apply 1 application topically daily as needed.    Past Week at Unknown time  . diazepam (VALIUM) 5 MG tablet Take 1 tablet by mouth twice daily as needed for anxiety  2 10/06/2016 at Unknown time  . HYDROcodone-acetaminophen (NORCO) 10-325 MG tablet TAKE 1 TABLET  FIVE TIMES DAILY AS NEEDED FOR PAIN  0 10/07/2016 at Unknown time  . losartan (COZAAR) 100 MG tablet Take 1 tablet by mouth daily  2 10/07/2016 at Unknown time  . meclizine (ANTIVERT) 25 MG tablet Take 25 mg by mouth at bedtime.    10/06/2016 at Unknown time  . nystatin (MYCOSTATIN/NYSTOP) 100000 UNIT/GM POWD Apply 1 g topically daily as needed (rash).    unknown  . pantoprazole (PROTONIX) 40 MG tablet Take 40 mg by mouth daily.   10/07/2016 at Unknown time  . torsemide (DEMADEX) 100 MG tablet Take 50 mg by mouth 2 (two) times daily. 5 day course starting on 10/04/2016  0 10/07/2016 at Unknown time  . triamcinolone cream (KENALOG) 0.1 % Apply 1 application topically daily as needed (for irritation).    unknown  . clopidogrel (PLAVIX) 75 MG tablet Take 75 mg by mouth daily.  0   . furosemide (LASIX) 40 MG tablet Take 40 mg by mouth daily AS DIRECTED 45 tablet 11 Taking   Scheduled: . atorvastatin  80 mg Oral Daily  .  ceFAZolin (ANCEF) IV  2 g Intravenous Q8H  . clopidogrel  75 mg Oral Daily  . enoxaparin (LOVENOX) injection  40 mg Subcutaneous Q24H  . furosemide  60 mg Intravenous Q12H  . meclizine  25 mg Oral QHS  .  metolazone  2.5 mg Oral Daily  . pantoprazole  40 mg Oral Daily  . potassium chloride  20 mEq Oral BID  . sodium chloride flush  3  mL Intravenous Q12H   Continuous:  IDP:OEUMPN chloride, acetaminophen, diazepam, hydrocerin, HYDROcodone-acetaminophen, ondansetron (ZOFRAN) IV, sodium chloride flush  Assesment:He was admitted with congestive heart failure significant edema chronic atrial fib cellulitis of his legs. He has some evidence of chronic renal failure and that has stabilized. He has hypertension at baseline. He complains of chronic itching and we've not found a cause for that. He is markedly improved. Principal Problem:   Congestive heart failure (CHF) (HCC) Active Problems:   Anemia   Essential hypertension   Atrial fibrillation (HCC)   Cellulitis   Acute kidney injury ([place])    Plan: Continue diuresis today. Possible discharge tomorrow. He can switch to oral antibiotics I think when he goes home and try low-salt diet I'm going to see if I can give him anything for the itching that will be effective as that's been a long-term problem. He has already been to a dermatologist and allergist with no definite cause being found. He also has iron deficiency anemia that will need to be worked up as an outpatient his vitamin B12 level is low and that could cause some trouble with itching so I'm going to start vitamin B12 IM for now    LOS: 3 days   Bernard Ramirez L 10/11/2016, 8:47 AM

## 2016-10-12 MED ORDER — HYDROXYZINE HCL 50 MG PO TABS: 50.0000 mg | ORAL_TABLET | Freq: Four times a day (QID) | ORAL | 0 refills | Status: DC | PRN

## 2016-10-12 MED ORDER — FAMOTIDINE 20 MG PO TABS: 20.0000 mg | ORAL_TABLET | Freq: Two times a day (BID) | ORAL | 5 refills | Status: DC

## 2016-10-12 MED ORDER — CEFUROXIME AXETIL 500 MG PO TABS: 500.0000 mg | ORAL_TABLET | Freq: Two times a day (BID) | ORAL | 0 refills | Status: DC

## 2016-10-12 MED ORDER — POTASSIUM CHLORIDE CRYS ER 20 MEQ PO TBCR: 20.0000 meq | EXTENDED_RELEASE_TABLET | Freq: Every day | ORAL | 12 refills | Status: DC

## 2016-10-12 MED ORDER — FUROSEMIDE 40 MG PO TABS: ORAL_TABLET | ORAL | 11 refills | Status: DC

## 2016-10-12 NOTE — Progress Notes (Signed)
He says he feels much better. No complaints today. His legs are better. He is ready for discharge.

## 2016-10-12 NOTE — Progress Notes (Signed)
Pt IV removed, tolerated well.  Reviewed discharge instructions with pt and family.  Answered all questions at this time.

## 2016-10-12 NOTE — Care Management Note (Signed)
Case Management Note  Patient Details  Name: LADARRIUS BOGDANSKI MRN: [id number] Date of Birth: Mar 13, 1931   Expected Discharge Date:  10/12/16               Expected Discharge Plan:  Home/Self Care  In-House Referral:  NA  Discharge planning Services  CM Consult  Post Acute Care Choice:  Home Health Choice offered to:  Patient Status of Service:  Completed, signed off  If discussed at [place] Length of Stay Meetings, dates discussed:  10/12/2016    Additional Comments: Pt discharging home today. Pt agreeable and enrolled in [facility] disease management. Sherald Barge, RN 10/12/2016, 9:12 AM

## 2016-10-12 NOTE — Discharge Summary (Signed)
Physician Discharge Summary  Patient ID: Bernard Ramirez MRN: [id number] DOB/AGE: 04/26/1931 83 y.o. Primary Care Physician:Roniesha Hollingshead L, MD Admit date: 10/07/2016 Discharge date: 10/12/2016    Discharge Diagnoses:   Principal Problem:   Congestive heart failure (CHF) ([place]) Active Problems:   Anemia   Essential hypertension   Atrial fibrillation (HCC)   Cellulitis   Acute kidney injury ([place])   Allergies as of 10/12/2016      Reactions   Contrast Media [iodinated Diagnostic Agents]    Pt states he was given "dye" 20 yrs ago, anaphylaxis & syncope - was told to never have it again    Gabapentin Itching   Aspirin Other (See Comments)   Blistering   Latex Rash   Lipitor [atorvastatin] Itching   Tape Rash   EKG leads      Medication List    STOP taking these medications   torsemide 100 MG tablet Commonly known as:  DEMADEX     TAKE these medications   atorvastatin 80 MG tablet Commonly known as:  LIPITOR Take 80 mg by mouth daily.   cefUROXime 500 MG tablet Commonly known as:  CEFTIN Take 1 tablet (500 mg total) by mouth 2 (two) times daily with a meal.   clobetasol cream 0.05 % Commonly known as:  TEMOVATE Apply 1 application topically daily as needed.   clopidogrel 75 MG tablet Commonly known as:  PLAVIX Take 75 mg by mouth daily.   diazepam 5 MG tablet Commonly known as:  VALIUM Take 1 tablet by mouth twice daily as needed for anxiety   famotidine 20 MG tablet Commonly known as:  PEPCID Take 1 tablet (20 mg total) by mouth 2 (two) times daily.   furosemide 40 MG tablet Commonly known as:  LASIX Take 40 mg by mouth twice a day What changed:  additional instructions   HYDROcodone-acetaminophen 10-325 MG tablet Commonly known as:  NORCO TAKE 1 TABLET  FIVE TIMES DAILY AS NEEDED FOR PAIN   hydrOXYzine 50 MG tablet Commonly known as:  ATARAX/VISTARIL Take 1 tablet (50 mg total) by mouth every 6 (six) hours as needed for itching.   losartan 100  MG tablet Commonly known as:  COZAAR Take 1 tablet by mouth daily   meclizine 25 MG tablet Commonly known as:  ANTIVERT Take 25 mg by mouth at bedtime.   nystatin powder Generic drug:  nystatin Apply 1 g topically daily as needed (rash).   pantoprazole 40 MG tablet Commonly known as:  PROTONIX Take 40 mg by mouth daily.   potassium chloride SA 20 MEQ tablet Commonly known as:  K-DUR,KLOR-CON Take 1 tablet (20 mEq total) by mouth daily.   triamcinolone cream 0.1 % Commonly known as:  KENALOG Apply 1 application topically daily as needed (for irritation).       Discharged Condition:Improved    Consults: Cardiology  Significant Diagnostic Studies: Dg Chest 2 View  Result Date: 10/07/2016 CLINICAL DATA:  Peripheral edema of both lower extremities. EXAM: CHEST  2 VIEW COMPARISON:  06/24/2012 CXR Heart is top-normal in size. There is mild elevation of the right hemidiaphragm, chronic in appearance. There is aortic atherosclerosis without aneurysm. Mild interstitial edema is noted with minimal atelectasis in the left mid lung and left lung base. No pleural effusion. Degenerative change along the dorsal spine without acute osseous abnormalities. Surgical clips are seen in the right upper quadrant of the abdomen likely from prior cholecystectomy. IMPRESSION: Borderline cardiomegaly with aortic atherosclerosis. Mild interstitial edema. Electronically Signed  By: Ashley Royalty M.D.   On: 10/07/2016 23:59   US Venous Img Lower Bilateral  Result Date: 10/08/2016 CLINICAL DATA:  Patient with bilateral lower extremity edema and pain. EXAM: BILATERAL LOWER EXTREMITY VENOUS DOPPLER ULTRASOUND TECHNIQUE: Gray-scale sonography with graded compression, as well as color Doppler and duplex ultrasound were performed to evaluate the lower extremity deep venous systems from the level of the common femoral vein and including the common femoral, femoral, profunda femoral, popliteal and calf veins  including the posterior tibial, peroneal and gastrocnemius veins when visible. The superficial great saphenous vein was also interrogated. Spectral Doppler was utilized to evaluate flow at rest and with distal augmentation maneuvers in the common femoral, femoral and popliteal veins. COMPARISON:  None. FINDINGS: RIGHT LOWER EXTREMITY Common Femoral Vein: No evidence of thrombus. Normal compressibility, respiratory phasicity and response to augmentation. Saphenofemoral Junction: No evidence of thrombus. Normal compressibility and flow on color Doppler imaging. Profunda Femoral Vein: No evidence of thrombus. Normal compressibility and flow on color Doppler imaging. Femoral Vein: No evidence of thrombus. Normal compressibility, respiratory phasicity and response to augmentation. Popliteal Vein: No evidence of thrombus. Normal compressibility, respiratory phasicity and response to augmentation. Calf Veins: No evidence of thrombus. Normal compressibility and flow on color Doppler imaging. Superficial Great Saphenous Vein: No evidence of thrombus. Normal compressibility and flow on color Doppler imaging. Venous Reflux:  None. Other Findings:  Subcutaneous edema. LEFT LOWER EXTREMITY Common Femoral Vein: No evidence of thrombus. Normal compressibility, respiratory phasicity and response to augmentation. Saphenofemoral Junction: No evidence of thrombus. Normal compressibility and flow on color Doppler imaging. Profunda Femoral Vein: No evidence of thrombus. Normal compressibility and flow on color Doppler imaging. Femoral Vein: No evidence of thrombus. Normal compressibility, respiratory phasicity and response to augmentation. Popliteal Vein: No evidence of thrombus. Normal compressibility, respiratory phasicity and response to augmentation. Calf Veins: No evidence of thrombus. Normal compressibility and flow on color Doppler imaging. Superficial Great Saphenous Vein: No evidence of thrombus. Normal compressibility and flow  on color Doppler imaging. Venous Reflux:  None. Other Findings:  Subcutaneous edema. IMPRESSION: No evidence of deep venous thrombosis. Electronically Signed   By: Lovey Newcomer M.D.   On: 10/08/2016 14:23    Lab Results: Basic Metabolic Panel:  Recent Labs  10/10/16 0404 10/11/16 0411  NA 137 135  K 3.9 3.7  CL 93* 90*  CO2 32 32  GLUCOSE 132* 138*  BUN 34* 37*  CREATININE 1.57* 1.56*  CALCIUM 9.3 9.4   Liver Function Tests:  Recent Labs  10/09/16 1204  ALBUMIN 3.8     CBC: No results for input(s): WBC, NEUTROABS, HGB, HCT, MCV, PLT in the last 72 hours.  No results found for this or any previous visit (from the past 240 hour(s)).   Hospital Course: This is an 83 year old who's had problems with cellulitis and swelling of his legs for several months. He had been in my office and I adjusted his diuretics but he continued to have problems and presented to the emergency room. He was found to have both cellulitis and edema and was treated for congestive heart failure. He was started on IV diuretics. He was started on IV antibiotics. Cardiology consultation was obtained. Echocardiogram showed good left ventricular function. After interviewing the patient it appeared that a lot of the problem was that he was consuming large amounts of salt daily. He was given instructions on a low-salt diet was placed on low-salt diet here in the hospital and he improved markedly.  By the time of discharge his legs had only minimal edema and he had minimal erythema. His lungs were clear. He has chronic atrial fib  Discharge Exam: Blood pressure 112/65, pulse 78, temperature 98 F (36.7 C), temperature source Axillary, resp. rate 16, height 6' (1.829 m), weight 115.9 kg (255 lb 8.2 oz), SpO2 100 %. He's awake and alert. No distress. Legs are much better. Lungs are clear.  Disposition: Home he does not want home health services but he does agree to [facility] care management. He will follow with cardiology. He  needs outpatient follow-up with gastroenterology because of iron deficiency anemia. He has seen gastroenterology in the past  Discharge Instructions    AMB Referral to [facility] Management    Complete by:  As directed    Please assign to [facility] for CHF disease and symptom management and compliance.Currently at [facility]. Please see liaison notes. Verbal consent obtained. Wife- Jaquez Farrington primary contact. Please call with questions. THanks. Marthenia Rolling, [place], RN,BSN-THN [facility] [phone]   Reason for consult:  Please assign to [facility] RNCM   Diagnoses of:  Heart Failure   Expected date of contact:  1-3 days (reserved for hospital discharges)   Call MD for:  persistant dizziness or light-headedness    Complete by:  As directed    Call MD for:  redness, tenderness, or signs of infection (pain, swelling, redness, odor or green/yellow discharge around incision site)    Complete by:  As directed    Diet - low sodium heart healthy    Complete by:  As directed    Increase activity slowly    Complete by:  As directed       Follow-up Information    Thompson Grayer, MD Follow up on 11/15/2016.   Specialty:  Cardiology Why:  9:45 am in [facility] information: [place] [address] [place] [address] [phone]           Signed: Alonza Bogus   10/12/2016, 8:53 AM

## 2016-10-12 NOTE — Care Management Important Message (Signed)
Important Message  Patient Details  Name: Bernard Ramirez MRN: [id number] Date of Birth: [birth year]   Medicare Important Message Given:  Yes    Sherald Barge, RN 10/12/2016, 9:12 AM

## 2016-10-13 ENCOUNTER — Ambulatory Visit (INDEPENDENT_AMBULATORY_CARE_PROVIDER_SITE_OTHER): Payer: PPO | Admitting: Vascular Surgery

## 2016-10-13 ENCOUNTER — Other Ambulatory Visit: Payer: Self-pay | Admitting: *Deleted

## 2016-10-13 ENCOUNTER — Encounter: Payer: Self-pay | Admitting: Vascular Surgery

## 2016-10-13 VITALS — BP 72/51 | HR 85 | Temp 97.0°F | Resp 20 | Ht 72.0 in | Wt 255.0 lb

## 2016-10-13 DIAGNOSIS — I872 Venous insufficiency (chronic) (peripheral): Secondary | ICD-10-CM

## 2016-10-13 NOTE — Progress Notes (Signed)
Patient ID: Bernard Ramirez, male   DOB: 1931/07/18, 83 y.o.   MRN: [id number]  Reason for Consult: New Evaluation (eval chronic venous induff - ref by Dr. Sinda Du)   Referred by Sinda Du, MD  Subjective:     HPI:  Bernard Ramirez is a 83 y.o. male with history of CHF and was referred here for bilateral lower extremity swelling and itching. There is consideration of venous insufficiency although he also has heart failure. He was recently hospitalized and discharged in the past few days with extreme swelling of his bilateral lower extremities with weeping of the skin. That time he is put on a salt restricted diet given increased doses of Lasix his legs were elevated and his legs are now much improved at this time. Per the wife he has very minimal swelling compared to his usual. This time his pain is well-controlled. He is not wearing compression stockings at this time.  Past Medical History:  Diagnosis Date  . Anemia    04/2012: H&H-10.2/31.5, MCV-77; 06/2012:12/38.9  . Arthritis   . Benign prostatic hypertrophy    s/p transurethral resection of the prostate  . Chest pain    2004; with dyspnea, stress nuclear-normal EF + questionable inferior wall ischemia, normal coronary angiography;  2010-negative stress nuclear  . CVA (cerebral infarction)    asymptomatic (seen on CT)  . Diarrhea    h/o Hemoccult-positive stool  . Dysrhythmia   . Edema of both legs   . GERD (gastroesophageal reflux disease)   . History of blood transfusion   . History of kidney stones   . History of urinary tract infection   . Hypertension   . Jerking movements of extremities    left arm   . Low back pain   . Obstructive sleep apnea    pt states can not use the CPAP  . Peripheral neuropathy (HCC)    lower legs and feet bilat   . Persistent atrial fibrillation ([place]) 2013   Asymptomatic; diagnosed in 05/2012  . Pneumonia   . Prostate cancer ([place]) 2017   stage 4  . Shortness of breath  dyspnea    pt states only develops SOB if tries to do something too quickly  . Upper GI bleed 1985   1985-peptic ulcer disease; initial hemigastrectomy; subsequent Billroth II  . Urinary hesitancy   . Vertigo    ED evaluation-06/2012   Family History  Problem Relation Age of Onset  . Diabetes Mellitus II Mother   . Allergic rhinitis Mother   . Lung disease Father   . Allergic rhinitis Father   . Angioedema Neg Hx   . Asthma Neg Hx   . Atopy Neg Hx   . Eczema Neg Hx   . Immunodeficiency Neg Hx   . Urticaria Neg Hx   . Cystic fibrosis Neg Hx   . Lupus Neg Hx   . Emphysema Neg Hx   . Migraines Neg Hx    Past Surgical History:  Procedure Laterality Date  . Billroth II    . CHOLECYSTECTOMY    . COLONOSCOPY  Approximately 2000  . COLONOSCOPY  10/2012   Colonoscopy March 2014 at [facility], difficulty exam requiring fluoroscopy and overtube. Patient developed severe bradycardia again during his procedure like he did [facility]. Multiple polyps removed which were tubular adenomas. Previously tattooed sites at 20 and 30 cm were free of any polypoid change. Left-sided diverticulosis noted.  . COLONOSCOPY WITH ESOPHAGOGASTRODUODENOSCOPY (EGD)  06-17-2009  QQI:WLNLGXQJ tubular adenomas and 2 tubulovillous adenomas, incomplete, 3 clips placed  . ESOPHAGOGASTRODUODENOSCOPY  06/17/2009   SLF: normal/chronic gastritis (non-h pylori)  . ESOPHAGOGASTRODUODENOSCOPY N/A 05/11/2015   JHE:RDEYCXK anemai due to postgastrectomy state/moderate erosive gastritis  . INGUINAL HERNIA REPAIR     Left  . ROTATOR CUFF REPAIR  1991   Bilateral  . TRANSURETHRAL RESECTION OF PROSTATE  06/2016  . TRANSURETHRAL RESECTION OF PROSTATE N/A 12/27/2012   Procedure: TRANSURETHRAL RESECTION OF THE PROSTATE (TURP);  Surgeon: Marissa Nestle, MD;  Location: [facility];  Service: Urology;  Laterality: N/A;  . TRANSURETHRAL RESECTION OF PROSTATE N/A 04/11/2016   Procedure: TRANSURETHRAL RESECTION OF THE PROSTATE (TURP);   Surgeon: Irine Seal, MD;  Location: [facility];  Service: Urology;  Laterality: N/A;  . VAGOTOMY AND PYLOROPLASTY  1970s   Details of procedure uncertain; 4818H    Short Social History:  Social History  Substance Use Topics  . Smoking status: Former Smoker    Years: 12.00    Types: Cigars    Quit date: 08/22/2003  . Smokeless tobacco: Never Used     Comment: Quit many years ago; few cigars per day; didn't inhale  . Alcohol use No    Allergies  Allergen Reactions  . Contrast Media [Iodinated Diagnostic Agents]     Pt states he was given "dye" 20 yrs ago, anaphylaxis & syncope - was told to never have it again   . Gabapentin Itching  . Aspirin Other (See Comments)    Blistering   . Latex Rash  . Lipitor [Atorvastatin] Itching  . Tape Rash    EKG leads    Current Outpatient Prescriptions  Medication Sig Dispense Refill  . atorvastatin (LIPITOR) 80 MG tablet Take 80 mg by mouth daily.    . cefUROXime (CEFTIN) 500 MG tablet Take 1 tablet (500 mg total) by mouth 2 (two) times daily with a meal. 20 tablet 0  . clobetasol cream (TEMOVATE) 6.31 % Apply 1 application topically daily as needed.     . clopidogrel (PLAVIX) 75 MG tablet Take 75 mg by mouth daily.  0  . diazepam (VALIUM) 5 MG tablet Take 1 tablet by mouth twice daily as needed for anxiety  2  . famotidine (PEPCID) 20 MG tablet Take 1 tablet (20 mg total) by mouth 2 (two) times daily. 60 tablet 5  . furosemide (LASIX) 40 MG tablet Take 40 mg by mouth twice a day 60 tablet 11  . HYDROcodone-acetaminophen (NORCO) 10-325 MG tablet TAKE 1 TABLET  FIVE TIMES DAILY AS NEEDED FOR PAIN  0  . hydrOXYzine (ATARAX/VISTARIL) 50 MG tablet Take 1 tablet (50 mg total) by mouth every 6 (six) hours as needed for itching. 30 tablet 0  . losartan (COZAAR) 100 MG tablet Take 1 tablet by mouth daily  2  . meclizine (ANTIVERT) 25 MG tablet Take 25 mg by mouth at bedtime.     Marland Kitchen nystatin (MYCOSTATIN/NYSTOP) 100000 UNIT/GM POWD Apply 1 g topically  daily as needed (rash).     . pantoprazole (PROTONIX) 40 MG tablet Take 40 mg by mouth daily.    . potassium chloride SA (K-DUR,KLOR-CON) 20 MEQ tablet Take 1 tablet (20 mEq total) by mouth daily. 30 tablet 12  . triamcinolone cream (KENALOG) 0.1 % Apply 1 application topically daily as needed (for irritation).      No current facility-administered medications for this visit.     Review of Systems  Constitutional:  Constitutional negative. HENT: HENT negative.  Eyes:  Eyes negative.  Respiratory: Positive for shortness of breath.  Cardiovascular: Positive for irregular heartbeat and leg swelling.  GI: Gastrointestinal negative.  Musculoskeletal: Musculoskeletal negative.  Skin: Positive for rash.  Neurological: Neurological negative. Hematologic: Hematologic/lymphatic negative.  Psychiatric: Psychiatric negative.        Objective:  Objective   Vitals:   10/13/16 1457  BP: (!) 72/51  Pulse: 85  Resp: 20  Temp: 97 F (36.1 C)  TempSrc: Oral  SpO2: 93%  Weight: 255 lb (115.7 kg)  Height: 6' (1.829 m)   Body mass index is 34.58 kg/m.  Physical Exam  Constitutional: He is oriented to person, place, and time. He appears well-developed.  HENT:  Head: Normocephalic.  Eyes: Pupils are equal, round, and reactive to light.  Neck: Normal range of motion.  Cardiovascular: Normal rate.   Pulmonary/Chest: Effort normal.  Abdominal: Soft. He exhibits no mass.  Musculoskeletal: He exhibits no deformity.  Bilateral c4 venous disease No pitting edema at this time  Neurological: He is alert and oriented to person, place, and time.  Skin: Skin is warm and dry.  Psychiatric: He has a normal mood and affect. His behavior is normal. Judgment and thought content normal.    Data: Venous incompetence noted in the bilateral saphenofemoral junctions and common femoral veins with right GSV 0.56 at saphenofemoral junction and left 0.5 to at the proximal thigh greatest diameter.       Assessment/Plan:     A 39-year-old male sent for evaluation of bilateral lower extremity swelling with concern for venous insufficiency. He does have evidence of C4 venous disease with reflux of the saphenofemoral junctions and common femoral veins bilaterally. He does not have reflux throughout the saphenous veins however. He was recently hospitalized and leg swelling is much improved on today's exam from previous following diuresis while inpatient. He is now on Lasix and salt restricted diet and we will prescribe 20-30 mmHg compression stockings today. Nothing intervention on his venous disease would be of benefit at this time he can follow-up on a when necessary basis.     Waynetta Sandy MD [facility]

## 2016-10-13 NOTE — Patient Outreach (Signed)
[facility] [place] [facility]) Care Management  10/13/2016  CAESAR MANNELLA [birth year] [id number]  Mr. AVIER JECH is an 83 year old gentleman living in [place] with history of CHF, anemia, essential hypertension, afib, recurrent lower extremity cellulitis, and acute kidney injury.   Mr. Taul was admitted to the hospital on 10/07/16 and discharged on 10/12/16 after presenting to the ED because of problems with cellulitis and swelling of his legs for several months. He had been by Dr. Luan Pulling in the office and had diuretics djusted but he continued to have problems. He was found to have both cellulitis and edema and was treated for congestive heart failure. He was started on IV diuretic and IV antibiotics.   Mr. Gustafson was evaluated by cardiology and after interview it appears that his sodium heavy diet is a major contributor to his edema and CHF. He was provided with extensive education. Mr. Meaders declined home health services but agreed to [facility] involvement.   I reached out to Mr. Skoda by phone today but was unable to speak with him. I did leave a HIPPA compliant voice message, requesting a return call.    Plan: I will reach out to Mr. Corpening again on Monday.    [facility]  [phone]

## 2016-10-16 ENCOUNTER — Other Ambulatory Visit: Payer: Self-pay | Admitting: *Deleted

## 2016-10-16 NOTE — Patient Outreach (Signed)
[facility]) Care Management  10/16/2016  Bernard Ramirez 05-Sep-1930 [id number]  Bernard Ramirez is an 83 year old gentleman living in [place] with history of CHF, anemia, essential hypertension, afib, recurrent lower extremity cellulitis, and acute kidney injury.   Bernard Ramirez was admitted to the hospital on 10/07/16 and discharged on 10/12/16 after presenting to the ED because of problems with cellulitis and swelling of his legs for several months. He had been by Dr. Luan Pulling in the office and had diuretics djusted but he continued to have problems. He was found to have both cellulitis and edema and was treated for congestive heart failure. He was started on IV diuretic and IV antibiotics.   Bernard Ramirez was evaluated by cardiology and after interview it appears that his sodium heavy diet is a major contributor to his edema and CHF. He was provided with extensive education. Bernard Ramirez declined home health services but agreed to [place] [facility] involvement.   I spoke with Bernard Ramirez wife today. She reported that he was napping but said she would have him return a call to me this afternoon. I did not hear from him by 5:30pm.   Plan: I will return a call to Bernard Ramirez tomorrow.    [facility]  [phone]

## 2016-10-16 NOTE — Discharge Summary (Signed)
NAME:  ISAIR, INABINET NO.:  MEDICAL RECORD NO.:  [id number]  LOCATION:                                 FACILITY:  PHYSICIAN:  Adalyna Godbee L. Luan Pulling, M.D.DATE OF BIRTH:  Jun 06, 1931  DATE OF ADMISSION: DATE OF DISCHARGE:  LH                              DISCHARGE SUMMARY   ADDENDUM:  Acute on chronic diastolic heart failure.     Myisha Pickerel L. Luan Pulling, M.D.     ELH/MEDQ  D:  10/15/2016  T:  10/15/2016  Job:  [id number]

## 2016-10-17 ENCOUNTER — Other Ambulatory Visit: Payer: Self-pay | Admitting: *Deleted

## 2016-10-17 NOTE — Patient Outreach (Signed)
[facility]) Care Management  10/17/2016  Bernard Ramirez 22-Nov-1930 [id number]  Mr. Bernard Ramirez is an 83 year old gentleman living in [place] with history of CHF, anemia, essential hypertension, afib, recurrent lower extremity cellulitis, and acute kidney injury.   [name] was admitted to the hospital on 10/07/16 and discharged on 10/12/16 after presenting to the ED because of problems with cellulitis and swelling of his legs for several months. He had been by Dr. Luan Pulling in the office and had diuretics djusted but he continued to have problems. He was found to have both cellulitis and edema and was treated for congestive heart failure. He was started on IV diuretic and IV antibiotics.   I reached out to [name] by phone yesterday and spoke with his wife who reported that he was sleeping "a lot" and asked her if she would have him return a call to me. He did not and I reached out to [name] again today. I did not get an answer but left a HIPPA compliant voice message requesting a return call.   Plan: If I do not hear from [name] by Friday, I will make another call on Friday.    [place] [facility]  [phone]

## 2016-10-20 ENCOUNTER — Other Ambulatory Visit: Payer: Self-pay | Admitting: *Deleted

## 2016-10-20 ENCOUNTER — Ambulatory Visit: Payer: Self-pay | Admitting: *Deleted

## 2016-10-20 ENCOUNTER — Telehealth: Payer: Self-pay | Admitting: *Deleted

## 2016-10-20 NOTE — Patient Outreach (Signed)
[facility]) Care Management  10/20/2016  Bernard Ramirez 12-21-1930 [id number]   Bernard Ramirez is an 83 year old gentleman living in [place] with history of CHF, anemia, essential hypertension, afib, recurrent lower extremity cellulitis, and acute kidney injury.   Bernard Ramirez was admitted to the hospital on 10/07/16 and discharged on 10/12/16 after presenting to the ED because of problems with cellulitis and swelling of his legs for several months.  Dr. Luan Pulling office had adjusted his diuretics but he continued to have problems. He was diagnosed and treated for cellulitis, edema congestive heart failure with IV diuretic andIV antibiotics.   Contact attempts to reach Bernard Ramirez has been made twice with messages left but no return call from him.    Janalyn Shy, [facility] CM and I attempted again to reach out to pt and was unsuccessful  Plan: If pt does not return a call, pt will be sent a letter offering services next week

## 2016-10-20 NOTE — Patient Outreach (Signed)
[facility]) Care Management  10/20/2016  [name] [birth year] [id number]  Bernard Ramirez was referred to [facility] after his recent hospitalization for CHF and Cellulitis. I spoke with his wife on 10/16/16 but have been unable reach Bernard Ramirez or his wife since then. I called today and left a voice message requesting a return call.   Plan: If I do not hear from Bernard Ramirez by Monday, I'll send a letter to his home offering to assist with his health care concerns.    [place] [facility]  [phone]

## 2016-10-23 ENCOUNTER — Other Ambulatory Visit: Payer: Self-pay | Admitting: *Deleted

## 2016-10-23 ENCOUNTER — Encounter: Payer: Self-pay | Admitting: *Deleted

## 2016-10-23 NOTE — Patient Outreach (Signed)
[place] [facility]) Care Management  10/23/2016  KAIS MONJE Apr 14, 1931 [id number]  Mr. KEVAN PROUTY is an 83 year old gentleman living in [place] with history of CHF, anemia, essential hypertension, afib, recurrent lower extremity cellulitis, and acute kidney injury.   Mr. Hubbert was admitted to the hospital on 10/07/16 and discharged on 10/12/16 after presenting to the ED because of problems with cellulitis and swelling of his legs for several months. He had been by Dr. Luan Pulling in the office and had diuretics djusted but he continued to have problems. He was found to have both cellulitis and edema and was treated for congestive heart failure. He was started on IV diuretic andIV antibiotics.   I spoke once with his wife just after his hospital discharge. She expressed an interest in [place] [facility] services and said she would give her husband the information and ask him to return a call to me. I've called 2 more times trying to reach Mr. Boomhower and have been unsuccessful.   Plan: I will send a letter to the home of Albert Lea offering our assistance with his health care needs should he be interested.    [facility]  [phone]

## 2016-10-24 DIAGNOSIS — L03115 Cellulitis of right lower limb: Secondary | ICD-10-CM | POA: Diagnosis not present

## 2016-11-01 ENCOUNTER — Encounter (HOSPITAL_COMMUNITY): Payer: PPO

## 2016-11-01 ENCOUNTER — Encounter: Payer: PPO | Admitting: Vascular Surgery

## 2016-11-01 DIAGNOSIS — M545 Low back pain: Secondary | ICD-10-CM | POA: Diagnosis not present

## 2016-11-01 DIAGNOSIS — I872 Venous insufficiency (chronic) (peripheral): Secondary | ICD-10-CM | POA: Diagnosis not present

## 2016-11-02 ENCOUNTER — Other Ambulatory Visit: Payer: Self-pay | Admitting: *Deleted

## 2016-11-02 ENCOUNTER — Encounter: Payer: Self-pay | Admitting: *Deleted

## 2016-11-02 NOTE — Patient Outreach (Signed)
[facility]) Care Management  11/02/2016  Bernard Ramirez [birth year] [id number]  Bernard Ramirez is an 83 year old gentleman living in [place] with history of CHF, anemia, essential hypertension, afib, recurrent lower extremity cellulitis, and acute kidney injury.   Bernard Ramirez was admitted to the hospital on 10/07/16 and discharged on 10/12/16 after presenting to the ED because of problems with cellulitis and swelling of his legs for several months. He had been by Dr. Luan Pulling in the office and had diuretics djusted but he continued to have problems. He was found to have both cellulitis and edema and was treated for congestive heart failure. He was started on IV diuretic andIV antibiotics.   I spoke once with his wife just after his hospital discharge. She expressed an interest in [place] [facility] services and said she would give her husband the information and ask him to return a call to me. I've called 3 more times trying to reach Bernard Ramirez and have been unsuccessful. I sent a letter to the home of Bernard Ramirez offering our assistance and have not had a response.  Plan: I will notify Bernard Ramirez primary care provider of inability to make contact with Bernard Ramirez and will close his case.    [facility]  [phone]

## 2016-11-03 ENCOUNTER — Encounter (HOSPITAL_COMMUNITY): Payer: PPO

## 2016-11-15 ENCOUNTER — Encounter: Payer: Self-pay | Admitting: Internal Medicine

## 2016-11-15 ENCOUNTER — Ambulatory Visit (INDEPENDENT_AMBULATORY_CARE_PROVIDER_SITE_OTHER): Payer: PPO | Admitting: Internal Medicine

## 2016-11-15 VITALS — BP 118/72 | HR 57 | Ht 72.0 in | Wt 263.2 lb

## 2016-11-15 DIAGNOSIS — I481 Persistent atrial fibrillation: Secondary | ICD-10-CM

## 2016-11-15 DIAGNOSIS — G4733 Obstructive sleep apnea (adult) (pediatric): Secondary | ICD-10-CM

## 2016-11-15 DIAGNOSIS — R0602 Shortness of breath: Secondary | ICD-10-CM | POA: Diagnosis not present

## 2016-11-15 DIAGNOSIS — R600 Localized edema: Secondary | ICD-10-CM

## 2016-11-15 DIAGNOSIS — I10 Essential (primary) hypertension: Secondary | ICD-10-CM | POA: Diagnosis not present

## 2016-11-15 DIAGNOSIS — I4819 Other persistent atrial fibrillation: Secondary | ICD-10-CM

## 2016-11-15 NOTE — Progress Notes (Signed)
Electrophysiology Office Note  Date:  11/15/2016   ID:  [name], [name] [birth year], MRN [id number]  PCP:  Bernard Bogus, MD  Cardiologist:  Previously [name] (last seen in 2013) Primary Electrophysiologist: Bernard Grayer, MD    CC: edema   History of Present Illness: Bernard Ramirez is a 83 y.o. male who presents today for electrophysiology follow-up.   The patient has longstanding persistent atrial fibrillation (has been in afib since at least 2013).   He has recently been hospitalized at [facility] for edema, cellulitis, and diastolic dysfunction.  He saw [name] while in the hospital.  He feels that he is back to baseline but unfortunately is not compliant with lifestyle modification. Today, he denies symptoms of palpitations, chest pain, orthopnea, PND,  claudication, dizziness, presyncope, or syncope. The patient is tolerating medications without difficulties and is otherwise without complaint today.    Past Medical History:  Diagnosis Date  . Anemia    04/2012: H&H-10.2/31.5, MCV-77; 06/2012:12/38.9  . Arthritis   . Benign prostatic hypertrophy    s/p transurethral resection of the prostate  . Chest pain    2004; with dyspnea, stress nuclear-normal EF + questionable inferior wall ischemia, normal coronary angiography;  2010-negative stress nuclear  . CVA (cerebral infarction)    asymptomatic (seen on CT)  . Diarrhea    h/o Hemoccult-positive stool  . Dysrhythmia   . Edema of both legs   . GERD (gastroesophageal reflux disease)   . History of blood transfusion   . History of kidney stones   . History of urinary tract infection   . Hypertension   . Jerking movements of extremities    left arm   . Low back pain   . Obstructive sleep apnea    pt states can not use the CPAP  . Peripheral neuropathy (HCC)    lower legs and feet bilat   . Persistent atrial fibrillation ([place]) 2013   Asymptomatic; diagnosed in 05/2012  . Pneumonia   . Prostate cancer  ([place]) 2017   stage 4  . Shortness of breath dyspnea    pt states only develops SOB if tries to do something too quickly  . Upper GI bleed 1985   1985-peptic ulcer disease; initial hemigastrectomy; subsequent Billroth II  . Urinary hesitancy   . Vertigo    ED evaluation-06/2012   Past Surgical History:  Procedure Laterality Date  . Billroth II    . CHOLECYSTECTOMY    . COLONOSCOPY  Approximately 2000  . COLONOSCOPY  10/2012   Colonoscopy March 2014 at [facility], difficulty exam requiring fluoroscopy and overtube. Patient developed severe bradycardia again during his procedure like he did [facility]. Multiple polyps removed which were tubular adenomas. Previously tattooed sites at 20 and 30 cm were free of any polypoid change. Left-sided diverticulosis noted.  . COLONOSCOPY WITH ESOPHAGOGASTRODUODENOSCOPY (EGD)  06-17-2009   WCB:[id number] tubular adenomas and 2 tubulovillous adenomas, incomplete, 3 clips placed  . ESOPHAGOGASTRODUODENOSCOPY  06/17/2009   SLF: normal/chronic gastritis (non-h pylori)  . ESOPHAGOGASTRODUODENOSCOPY N/A 05/11/2015   VOH:[id number] anemai due to postgastrectomy state/moderate erosive gastritis  . INGUINAL HERNIA REPAIR     Left  . ROTATOR CUFF REPAIR  1991   Bilateral  . TRANSURETHRAL RESECTION OF PROSTATE  06/2016  . TRANSURETHRAL RESECTION OF PROSTATE N/A 12/27/2012   Procedure: TRANSURETHRAL RESECTION OF THE PROSTATE (TURP);  Surgeon: Marissa Nestle, MD;  Location: [facility];  Service: Urology;  Laterality: N/A;  . TRANSURETHRAL RESECTION  OF PROSTATE N/A 04/11/2016   Procedure: TRANSURETHRAL RESECTION OF THE PROSTATE (TURP);  Surgeon: Irine Seal, MD;  Location: [facility];  Service: Urology;  Laterality: N/A;  . VAGOTOMY AND PYLOROPLASTY  1970s   Details of procedure uncertain; 1962I     Current Outpatient Prescriptions  Medication Sig Dispense Refill  . clobetasol cream (TEMOVATE) 2.97 % Apply 1 application topically daily as needed.     . clopidogrel  (PLAVIX) 75 MG tablet Take 75 mg by mouth daily.  0  . diazepam (VALIUM) 5 MG tablet Take 1 tablet by mouth twice daily as needed for anxiety  2  . furosemide (LASIX) 40 MG tablet Take 40 mg by mouth twice a day 60 tablet 11  . HYDROcodone-acetaminophen (NORCO) 10-325 MG tablet TAKE 1 TABLET  FIVE TIMES DAILY AS NEEDED FOR PAIN  0  . hydrOXYzine (ATARAX/VISTARIL) 50 MG tablet Take 1 tablet (50 mg total) by mouth every 6 (six) hours as needed for itching. 30 tablet 0  . losartan (COZAAR) 100 MG tablet Take 1 tablet by mouth daily  2  . meclizine (ANTIVERT) 25 MG tablet Take 25 mg by mouth at bedtime.     Marland Kitchen nystatin (MYCOSTATIN/NYSTOP) 100000 UNIT/GM POWD Apply 1 g topically daily as needed (rash).     . pantoprazole (PROTONIX) 40 MG tablet Take 40 mg by mouth daily.    . potassium chloride SA (K-DUR,KLOR-CON) 20 MEQ tablet Take 1 tablet (20 mEq total) by mouth daily. 30 tablet 12  . triamcinolone cream (KENALOG) 0.1 % Apply 1 application topically daily as needed (for irritation).      No current facility-administered medications for this visit.     Allergies:   Contrast media [iodinated diagnostic agents]; Gabapentin; Aspirin; Latex; Lipitor [atorvastatin]; and Tape   Social History:  The patient  reports that he quit smoking about 13 years ago. His smoking use included Cigars. He quit after 12.00 years of use. He has never used smokeless tobacco. He reports that he does not drink alcohol or use drugs.   Family History:  The patient's  family history includes Allergic rhinitis in his father and mother; Diabetes Mellitus II in his mother; Lung disease in his father.    ROS:  Please see the history of present illness.   All other systems are reviewed and negative.    PHYSICAL EXAM: VS:  BP 118/72   Pulse (!) 57   Ht 6' (1.829 m)   Wt 263 lb 4 oz (119.4 kg)   SpO2 96%   BMI 35.70 kg/m  , BMI Body mass index is 35.7 kg/m. GEN: overweight, in no acute distress  HEENT: normal  Neck: no  JVD, carotid bruits, or masses Cardiac: iRRR; no murmurs, rubs, or gallops,+ 2 edema R>L  Respiratory:  clear to auscultation bilaterally, normal work of breathing GI: soft, nontender, nondistended, + BS MS: no deformity or atrophy  Skin: warm and dry  Neuro:  Strength and sensation are intact Psych: euthymic mood, full affect  Recent Labs: 10/08/2016: B Natriuretic Peptide 69.0 10/09/2016: Hemoglobin 11.1; Platelets 251 10/11/2016: BUN 37; Creatinine, Ser 1.56; Potassium 3.7; Sodium 135    Lipid Panel     Component Value Date/Time   CHOL  05/18/2009 0525    179        ATP III CLASSIFICATION:  <200     mg/dL   Desirable  200-239  mg/dL   Borderline High  >=240    mg/dL   High  TRIG 189 (H) 05/18/2009 0525   HDL 34 (L) 05/18/2009 0525   CHOLHDL 5.3 05/18/2009 0525   VLDL 38 05/18/2009 0525   LDLCALC (H) 05/18/2009 0525    107        Total Cholesterol/HDL:CHD Risk Coronary Heart Disease Risk Table                     Men   Women  1/2 Average Risk   3.4   3.3  Average Risk       5.0   4.4  2 X Average Risk   9.6   7.1  3 X Average Risk  23.4   11.0        Use the calculated Patient Ratio above and the CHD Risk Table to determine the patient's CHD Risk.        ATP III CLASSIFICATION (LDL):  <100     mg/dL   Optimal  100-129  mg/dL   Near or Above                    Optimal  130-159  mg/dL   Borderline  160-189  mg/dL   High  >190     mg/dL   Very High     Wt Readings from Last 3 Encounters:  11/15/16 263 lb 4 oz (119.4 kg)  10/13/16 255 lb (115.7 kg)  10/11/16 255 lb 8.2 oz (115.9 kg)      ASSESSMENT AND PLAN:  1.  Longstanding persistent atrial fibrillation Continue rate control long term. chads2vasc score is at least 5.  Unfortunately, he has ongoing difficulty with bleeding and anemia and is felt to be a poor candidate for long term anticoagulation.  Also not a candidate for watchman due to asa intolerance.  2. SOB Previous echo and myoview were  low risk PFTs revealed mild interestitial process, possibly age related No further workup planned.  3.  HTN Stable No change required today  4. OSA He is not compliant with therapy  5. Edema Likely multifactoral He is noncompliant with support hose or sodium restriction. I have encouraged compliance with these again today.  2 gram sodium diet  Return to see me 3 months  Signed, Bernard Grayer, MD  11/15/2016 10:33 AM     [facility] [address] [place] [address] [phone] (office) [phone] (fax)

## 2016-11-15 NOTE — Patient Instructions (Addendum)
Medication Instructions:  Your physician recommends that you continue on your current medications as directed. Please refer to the Current Medication list given to you today.   Labwork: Your physician recommends that you return for lab work today: BNP/BMP/CBC   Testing/Procedures: None ordered   Follow-Up: Your physician recommends that you schedule a follow-up appointment in: 3 months with Dr [name]   Any Other Special Instructions Will Be Listed Below (If Applicable).  Support hose---[employer]mes Walker [address] [place]    Low-Sodium Eating Plan Sodium, which is an element that makes up salt, helps you maintain a healthy balance of fluids in your body. Too much sodium can increase your blood pressure and cause fluid and waste to be held in your body. Your health care provider or dietitian may recommend following this plan if you have high blood pressure (hypertension), kidney disease, liver disease, or heart failure. Eating less sodium can help lower your blood pressure, reduce swelling, and protect your heart, liver, and kidneys. What are tips for following this plan? General guidelines   Most people on this plan should limit their sodium intake to 1,500-2,000 mg (milligrams) of sodium each day. Reading food labels   The Nutrition Facts label lists the amount of sodium in one serving of the food. If you eat more than one serving, you must multiply the listed amount of sodium by the number of servings.  Choose foods with less than 140 mg of sodium per serving.  Avoid foods with 300 mg of sodium or more per serving. Shopping   Look for lower-sodium products, often labeled as "low-sodium" or "no salt added."  Always check the sodium content even if foods are labeled as "unsalted" or "no salt added".  Buy fresh foods.  Avoid canned foods and premade or frozen meals.  Avoid canned, cured, or processed meats  Buy breads that have less than 80 mg of sodium per  slice. Cooking   Eat more home-cooked food and less restaurant, buffet, and fast food.  Avoid adding salt when cooking. Use salt-free seasonings or herbs instead of table salt or sea salt. Check with your health care provider or pharmacist before using salt substitutes.  Cook with plant-based oils, such as canola, sunflower, or olive oil. Meal planning   When eating at a restaurant, ask that your food be prepared with less salt or no salt, if possible.  Avoid foods that contain MSG (monosodium glutamate). MSG is sometimes added to [place] food, bouillon, and some canned foods. What foods are recommended? The items listed may not be a complete list. Talk with your dietitian about what dietary choices are best for you. Grains  Low-sodium cereals, including oats, puffed wheat and rice, and shredded wheat. Low-sodium crackers. Unsalted rice. Unsalted pasta. Low-sodium bread. Whole-grain breads and whole-grain pasta. Vegetables  Fresh or frozen vegetables. "No salt added" canned vegetables. "No salt added" tomato sauce and paste. Low-sodium or reduced-sodium tomato and vegetable juice. Fruits  Fresh, frozen, or canned fruit. Fruit juice. Meats and other protein foods  Fresh or frozen (no salt added) meat, poultry, seafood, and fish. Low-sodium canned tuna and salmon. Unsalted nuts. Dried peas, beans, and lentils without added salt. Unsalted canned beans. Eggs. Unsalted nut butters. Dairy  Milk. Soy milk. Cheese that is naturally low in sodium, such as ricotta cheese, fresh mozzarella, or Swiss cheese Low-sodium or reduced-sodium cheese. Cream cheese. Yogurt. Fats and oils  Unsalted butter. Unsalted margarine with no trans fat. Vegetable oils such as canola  or olive oils. Seasonings and other foods  Fresh and dried herbs and spices. Salt-free seasonings. Low-sodium mustard and ketchup. Sodium-free salad dressing. Sodium-free light mayonnaise. Fresh or refrigerated horseradish. Lemon juice.  Vinegar. Homemade, reduced-sodium, or low-sodium soups. Unsalted popcorn and pretzels. Low-salt or salt-free chips. What foods are not recommended? The items listed may not be a complete list. Talk with your dietitian about what dietary choices are best for you. Grains  Instant hot cereals. Bread stuffing, pancake, and biscuit mixes. Croutons. Seasoned rice or pasta mixes. Noodle soup cups. Boxed or frozen macaroni and cheese. Regular salted crackers. Self-rising flour. Vegetables  Sauerkraut, pickled vegetables, and relishes. Olives. [place] fries. Onion rings. Regular canned vegetables (not low-sodium or reduced-sodium). Regular canned tomato sauce and paste (not low-sodium or reduced-sodium). Regular tomato and vegetable juice (not low-sodium or reduced-sodium). Frozen vegetables in sauces. Meats and other protein foods  Meat or fish that is salted, canned, smoked, spiced, or pickled. Bacon, ham, sausage, hotdogs, corned beef, chipped beef, packaged lunch meats, salt pork, jerky, pickled herring, anchovies, regular canned tuna, sardines, salted nuts. Dairy  Processed cheese and cheese spreads. Cheese curds. Blue cheese. Feta cheese. String cheese. Regular cottage cheese. Buttermilk. Canned milk. Fats and oils  Salted butter. Regular margarine. Ghee. Bacon fat. Seasonings and other foods  Onion salt, garlic salt, seasoned salt, table salt, and sea salt. Canned and packaged gravies. Worcestershire sauce. Tartar sauce. Barbecue sauce. Teriyaki sauce. Soy sauce, including reduced-sodium. Steak sauce. Fish sauce. Oyster sauce. Cocktail sauce. Horseradish that you find on the shelf. Regular ketchup and mustard. Meat flavorings and tenderizers. Bouillon cubes. Hot sauce and Tabasco sauce. Premade or packaged marinades. Premade or packaged taco seasonings. Relishes. Regular salad dressings. Salsa. Potato and tortilla chips. Corn chips and puffs. Salted popcorn and pretzels. Canned or dried soups. Pizza.  Frozen entrees and pot pies. Summary  Eating less sodium can help lower your blood pressure, reduce swelling, and protect your heart, liver, and kidneys.  Most people on this plan should limit their sodium intake to 1,500-2,000 mg (milligrams) of sodium each day.  Canned, boxed, and frozen foods are high in sodium. Restaurant foods, fast foods, and pizza are also very high in sodium. You also get sodium by adding salt to food.  Try to cook at home, eat more fresh fruits and vegetables, and eat less fast food, canned, processed, or prepared foods. This information is not intended to replace advice given to you by your health care provider. Make sure you discuss any questions you have with your health care provider. Document Released: 01/27/2002 Document Revised: 07/31/2016 Document Reviewed: 07/31/2016 Elsevier Interactive Patient Education  2017 [employer].

## 2016-11-16 LAB — CBC WITH DIFFERENTIAL/PLATELET
BASOS ABS: 0 10*3/uL (ref 0.0–0.2)
Basos: 0 %
EOS (ABSOLUTE): 0.3 10*3/uL (ref 0.0–0.4)
EOS: 4 %
HEMOGLOBIN: 11 g/dL — AB (ref 13.0–17.7)
Hematocrit: 36.4 % — ABNORMAL LOW (ref 37.5–51.0)
IMMATURE GRANULOCYTES: 1 %
Immature Grans (Abs): 0 10*3/uL (ref 0.0–0.1)
LYMPHS ABS: 1.7 10*3/uL (ref 0.7–3.1)
Lymphs: 26 %
MCH: 23.4 pg — ABNORMAL LOW (ref 26.6–33.0)
MCHC: 30.2 g/dL — AB (ref 31.5–35.7)
MCV: 77 fL — ABNORMAL LOW (ref 79–97)
MONOCYTES: 12 %
Monocytes Absolute: 0.8 10*3/uL (ref 0.1–0.9)
NEUTROS PCT: 57 %
Neutrophils Absolute: 3.8 10*3/uL (ref 1.4–7.0)
Platelets: 243 10*3/uL (ref 150–379)
RBC: 4.7 x10E6/uL (ref 4.14–5.80)
RDW: 17.3 % — AB (ref 12.3–15.4)
WBC: 6.6 10*3/uL (ref 3.4–10.8)

## 2016-11-16 LAB — BASIC METABOLIC PANEL
BUN / CREAT RATIO: 19 (ref 10–24)
BUN: 22 mg/dL (ref 8–27)
CHLORIDE: 98 mmol/L (ref 96–106)
CO2: 23 mmol/L (ref 18–29)
CREATININE: 1.17 mg/dL (ref 0.76–1.27)
Calcium: 9.6 mg/dL (ref 8.6–10.2)
GFR calc Af Amer: 66 mL/min/{1.73_m2} (ref >59)
GFR calc non Af Amer: 57 mL/min/{1.73_m2} — ABNORMAL LOW (ref >59)
GLUCOSE: 106 mg/dL — AB (ref 65–99)
Potassium: 4.9 mmol/L (ref 3.5–5.2)
Sodium: 139 mmol/L (ref 134–144)

## 2016-11-16 LAB — PRO B NATRIURETIC PEPTIDE: NT-PRO BNP: 329 pg/mL (ref 0–486)

## 2016-11-20 ENCOUNTER — Ambulatory Visit: Payer: PPO | Admitting: Nurse Practitioner

## 2016-11-20 ENCOUNTER — Ambulatory Visit: Payer: PPO | Admitting: Internal Medicine

## 2016-12-02 DIAGNOSIS — M545 Low back pain: Secondary | ICD-10-CM | POA: Diagnosis not present

## 2016-12-02 DIAGNOSIS — I872 Venous insufficiency (chronic) (peripheral): Secondary | ICD-10-CM | POA: Diagnosis not present

## 2016-12-04 DIAGNOSIS — I482 Chronic atrial fibrillation: Secondary | ICD-10-CM | POA: Diagnosis not present

## 2016-12-04 DIAGNOSIS — I872 Venous insufficiency (chronic) (peripheral): Secondary | ICD-10-CM | POA: Diagnosis not present

## 2016-12-04 DIAGNOSIS — M545 Low back pain: Secondary | ICD-10-CM | POA: Diagnosis not present

## 2016-12-04 DIAGNOSIS — I10 Essential (primary) hypertension: Secondary | ICD-10-CM | POA: Diagnosis not present

## 2017-01-01 DIAGNOSIS — M545 Low back pain: Secondary | ICD-10-CM | POA: Diagnosis not present

## 2017-01-01 DIAGNOSIS — I872 Venous insufficiency (chronic) (peripheral): Secondary | ICD-10-CM | POA: Diagnosis not present

## 2017-01-29 ENCOUNTER — Encounter: Payer: Self-pay | Admitting: Internal Medicine

## 2017-02-01 DIAGNOSIS — M545 Low back pain: Secondary | ICD-10-CM | POA: Diagnosis not present

## 2017-02-01 DIAGNOSIS — I872 Venous insufficiency (chronic) (peripheral): Secondary | ICD-10-CM | POA: Diagnosis not present

## 2017-02-14 ENCOUNTER — Ambulatory Visit (INDEPENDENT_AMBULATORY_CARE_PROVIDER_SITE_OTHER): Payer: PPO | Admitting: Internal Medicine

## 2017-02-14 ENCOUNTER — Encounter: Payer: Self-pay | Admitting: Internal Medicine

## 2017-02-14 VITALS — BP 140/84 | HR 61 | Ht 72.0 in | Wt 261.6 lb

## 2017-02-14 DIAGNOSIS — I10 Essential (primary) hypertension: Secondary | ICD-10-CM

## 2017-02-14 DIAGNOSIS — I481 Persistent atrial fibrillation: Secondary | ICD-10-CM

## 2017-02-14 DIAGNOSIS — I5032 Chronic diastolic (congestive) heart failure: Secondary | ICD-10-CM

## 2017-02-14 DIAGNOSIS — I4819 Other persistent atrial fibrillation: Secondary | ICD-10-CM

## 2017-02-14 NOTE — Progress Notes (Signed)
PCP: Sinda Du, MD  Bernard Ramirez is a 83 y.o. male who presents today for routine electrophysiology followup.  Since last being seen in our clinic, the patient reports doing very well.  Swelling is well controlled.  He had a mechanical fall 2 weeks ago.  Today, he denies symptoms of palpitations, chest pain, shortness of breath, dizziness, presyncope, or syncope.  The patient is otherwise without complaint today.   Past Medical History:  Diagnosis Date  . Anemia    04/2012: H&H-10.2/31.5, MCV-77; 06/2012:12/38.9  . Arthritis   . Benign prostatic hypertrophy    s/p transurethral resection of the prostate  . Chest pain    2004; with dyspnea, stress nuclear-normal EF + questionable inferior wall ischemia, normal coronary angiography;  2010-negative stress nuclear  . CVA (cerebral infarction)    asymptomatic (seen on CT)  . Diarrhea    h/o Hemoccult-positive stool  . Dysrhythmia   . Edema of both legs   . GERD (gastroesophageal reflux disease)   . History of blood transfusion   . History of kidney stones   . History of urinary tract infection   . Hypertension   . Jerking movements of extremities    left arm   . Low back pain   . Obstructive sleep apnea    pt states can not use the CPAP  . Peripheral neuropathy    lower legs and feet bilat   . Persistent atrial fibrillation ([place]) 2013   Asymptomatic; diagnosed in 05/2012  . Pneumonia   . Prostate cancer ([place]) 2017   stage 4  . Shortness of breath dyspnea    pt states only develops SOB if tries to do something too quickly  . Upper GI bleed 1985   1985-peptic ulcer disease; initial hemigastrectomy; subsequent Billroth II  . Urinary hesitancy   . Vertigo    ED evaluation-06/2012   Past Surgical History:  Procedure Laterality Date  . Billroth II    . CHOLECYSTECTOMY    . COLONOSCOPY  Approximately 2000  . COLONOSCOPY  10/2012   Colonoscopy March 2014 at [facility], difficulty exam requiring fluoroscopy and overtube.  Patient developed severe bradycardia again during his procedure like he did [facility]. Multiple polyps removed which were tubular adenomas. Previously tattooed sites at 20 and 30 cm were free of any polypoid change. Left-sided diverticulosis noted.  . COLONOSCOPY WITH ESOPHAGOGASTRODUODENOSCOPY (EGD)  06-17-2009   HQI:[id number] tubular adenomas and 2 tubulovillous adenomas, incomplete, 3 clips placed  . ESOPHAGOGASTRODUODENOSCOPY  06/17/2009   SLF: normal/chronic gastritis (non-h pylori)  . ESOPHAGOGASTRODUODENOSCOPY N/A 05/11/2015   UXL:KGMWNUU anemai due to postgastrectomy state/moderate erosive gastritis  . INGUINAL HERNIA REPAIR     Left  . ROTATOR CUFF REPAIR  1991   Bilateral  . TRANSURETHRAL RESECTION OF PROSTATE  06/2016  . TRANSURETHRAL RESECTION OF PROSTATE N/A 12/27/2012   Procedure: TRANSURETHRAL RESECTION OF THE PROSTATE (TURP);  Surgeon: Marissa Nestle, MD;  Location: [facility];  Service: Urology;  Laterality: N/A;  . TRANSURETHRAL RESECTION OF PROSTATE N/A 04/11/2016   Procedure: TRANSURETHRAL RESECTION OF THE PROSTATE (TURP);  Surgeon: Irine Seal, MD;  Location: [facility];  Service: Urology;  Laterality: N/A;  . VAGOTOMY AND PYLOROPLASTY  1970s   Details of procedure uncertain; 7253G    ROS- all systems are reviewed and negatives except as per HPI above  Current Outpatient Prescriptions  Medication Sig Dispense Refill  . clobetasol cream (TEMOVATE) 6.44 % Apply 1 application topically daily as needed (use as  directed for skin).     Marland Kitchen clopidogrel (PLAVIX) 75 MG tablet Take 75 mg by mouth daily.  0  . diazepam (VALIUM) 5 MG tablet Take 1 tablet by mouth twice daily as needed for anxiety  2  . furosemide (LASIX) 40 MG tablet Take 40 mg by mouth daily.    Marland Kitchen HYDROcodone-acetaminophen (NORCO) 10-325 MG tablet TAKE 1 TABLET  FIVE TIMES DAILY AS NEEDED FOR PAIN  0  . hydrOXYzine (ATARAX/VISTARIL) 50 MG tablet Take 1 tablet (50 mg total) by mouth every 6 (six) hours as needed  for itching. 30 tablet 0  . losartan (COZAAR) 100 MG tablet Take 1 tablet by mouth daily  2  . meclizine (ANTIVERT) 25 MG tablet Take 25 mg by mouth at bedtime.     Marland Kitchen nystatin (MYCOSTATIN/NYSTOP) 100000 UNIT/GM POWD Apply 1 g topically daily as needed (rash).     . pantoprazole (PROTONIX) 40 MG tablet Take 40 mg by mouth daily.    . potassium chloride SA (K-DUR,KLOR-CON) 20 MEQ tablet Take 1 tablet (20 mEq total) by mouth daily. 30 tablet 12  . triamcinolone cream (KENALOG) 0.1 % Apply 1 application topically daily as needed (for irritation).      No current facility-administered medications for this visit.     Physical Exam: Vitals:   02/14/17 1150  BP: (!) 150/94  Pulse: 61  SpO2: 94%  Weight: 261 lb 9.6 oz (118.7 kg)  Height: 6' (1.829 m)    GEN- The patient is well appearing, alert and oriented x 3 today.   Head- normocephalic, atraumatic Eyes-  Sclera clear, conjunctiva pink Ears- hearing intact Oropharynx- clear Lungs- Clear to ausculation bilaterally, normal work of breathing Heart- Regular rate and rhythm, no murmurs, rubs or gallops, PMI not laterally displaced GI- soft, NT, ND, + BS Extremities- no clubbing, cyanosis, +1 edema (improved)  EKG tracing ordered today is personally reviewed and shows rate controlled afib  Assessment and Plan:  1. Long standing persistent afib Rate controlled Not a candidate for anticoagulation or watchman  2. Chronic diastolic dysfunciton euvolemic today bmet 2 gram sodium restriction  3. HTN Stable No change required today  Return to see me in 3 months  Thompson Grayer MD, [facility] 02/14/2017 12:04 PM

## 2017-02-14 NOTE — Patient Instructions (Signed)
Medication Instructions:  Your physician recommends that you continue on your current medications as directed. Please refer to the Current Medication list given to you today.   Labwork: Your physician recommends that you have labs today: BMP   Testing/Procedures: None ordered   Follow-Up: Your physician recommends that you schedule a follow-up appointment in: 3 months with Dr [name]   Any Other Special Instructions Will Be Listed Below (If Applicable).     If you need a refill on your cardiac medications before your next appointment, please call your pharmacy.

## 2017-02-15 LAB — BASIC METABOLIC PANEL
BUN/Creatinine Ratio: 14 (ref 10–24)
BUN: 15 mg/dL (ref 8–27)
CALCIUM: 9.4 mg/dL (ref 8.6–10.2)
CO2: 24 mmol/L (ref 20–29)
CREATININE: 1.07 mg/dL (ref 0.76–1.27)
Chloride: 101 mmol/L (ref 96–106)
GFR calc Af Amer: 73 mL/min/{1.73_m2} (ref >59)
GFR, EST NON AFRICAN AMERICAN: 63 mL/min/{1.73_m2} (ref >59)
GLUCOSE: 98 mg/dL (ref 65–99)
Potassium: 5.1 mmol/L (ref 3.5–5.2)
Sodium: 142 mmol/L (ref 134–144)

## 2017-03-03 DIAGNOSIS — M545 Low back pain: Secondary | ICD-10-CM | POA: Diagnosis not present

## 2017-03-03 DIAGNOSIS — I872 Venous insufficiency (chronic) (peripheral): Secondary | ICD-10-CM | POA: Diagnosis not present

## 2017-03-05 DIAGNOSIS — R609 Edema, unspecified: Secondary | ICD-10-CM | POA: Diagnosis not present

## 2017-03-05 DIAGNOSIS — I482 Chronic atrial fibrillation: Secondary | ICD-10-CM | POA: Diagnosis not present

## 2017-03-05 DIAGNOSIS — M545 Low back pain: Secondary | ICD-10-CM | POA: Diagnosis not present

## 2017-03-05 DIAGNOSIS — I10 Essential (primary) hypertension: Secondary | ICD-10-CM | POA: Diagnosis not present

## 2017-04-03 DIAGNOSIS — I872 Venous insufficiency (chronic) (peripheral): Secondary | ICD-10-CM | POA: Diagnosis not present

## 2017-04-03 DIAGNOSIS — M545 Low back pain: Secondary | ICD-10-CM | POA: Diagnosis not present

## 2017-05-04 DIAGNOSIS — M545 Low back pain: Secondary | ICD-10-CM | POA: Diagnosis not present

## 2017-05-04 DIAGNOSIS — I872 Venous insufficiency (chronic) (peripheral): Secondary | ICD-10-CM | POA: Diagnosis not present

## 2017-05-14 ENCOUNTER — Encounter: Payer: Self-pay | Admitting: Internal Medicine

## 2017-05-14 ENCOUNTER — Ambulatory Visit (INDEPENDENT_AMBULATORY_CARE_PROVIDER_SITE_OTHER): Payer: PPO | Admitting: Internal Medicine

## 2017-05-14 VITALS — BP 142/68 | HR 55 | Ht 72.0 in | Wt 269.0 lb

## 2017-05-14 DIAGNOSIS — I5033 Acute on chronic diastolic (congestive) heart failure: Secondary | ICD-10-CM

## 2017-05-14 DIAGNOSIS — I110 Hypertensive heart disease with heart failure: Secondary | ICD-10-CM | POA: Diagnosis not present

## 2017-05-14 DIAGNOSIS — I4819 Other persistent atrial fibrillation: Secondary | ICD-10-CM

## 2017-05-14 DIAGNOSIS — I481 Persistent atrial fibrillation: Secondary | ICD-10-CM

## 2017-05-14 MED ORDER — FUROSEMIDE 40 MG PO TABS: ORAL_TABLET | ORAL | 11 refills | Status: DC

## 2017-05-14 NOTE — Patient Instructions (Addendum)
Medication Instructions:  Your physician has recommended you make the following change in your medication:  Increase Lasix to 40mg  twice daily for 4 days then decrease to 40 mg daily.   Labwork: None ordered  Testing/Procedures: None ordered   Follow-Up: Your physician recommends that you schedule a follow-up appointment in: 3 months with Tommye Standard, PA.   Any Other Special Instructions Will Be Listed Below (If Applicable).   Low-Sodium Eating Plan Sodium, which is an element that makes up salt, helps you maintain a healthy balance of fluids in your body. Too much sodium can increase your blood pressure and cause fluid and waste to be held in your body. Your health care provider or dietitian may recommend following this plan if you have high blood pressure (hypertension), kidney disease, liver disease, or heart failure. Eating less sodium can help lower your blood pressure, reduce swelling, and protect your heart, liver, and kidneys. What are tips for following this plan? General guidelines  Most people on this plan should limit their sodium intake to 1,500-2,000 mg (milligrams) of sodium each day.   Dr. [name] wants you to stay under at 2,000 mg daily. Reading food labels  The Nutrition Facts label lists the amount of sodium in one serving of the food. If you eat more than one serving, you must multiply the listed amount of sodium by the number of servings.  Choose foods with less than 140 mg of sodium per serving.  Avoid foods with 300 mg of sodium or more per serving. Shopping  Look for lower-sodium products, often labeled as "low-sodium" or "no salt added."  Always check the sodium content even if foods are labeled as "unsalted" or "no salt added".  Buy fresh foods. ? Avoid canned foods and premade or frozen meals. ? Avoid canned, cured, or processed meats  Buy breads that have less than 80 mg of sodium per slice. Cooking  Eat more home-cooked food and less  restaurant, buffet, and fast food.  Avoid adding salt when cooking. Use salt-free seasonings or herbs instead of table salt or sea salt. Check with your health care provider or pharmacist before using salt substitutes.  Cook with plant-based oils, such as canola, sunflower, or olive oil. Meal planning  When eating at a restaurant, ask that your food be prepared with less salt or no salt, if possible.  Avoid foods that contain MSG (monosodium glutamate). MSG is sometimes added to [place] food, bouillon, and some canned foods. What foods are recommended? The items listed may not be a complete list. Talk with your dietitian about what dietary choices are best for you. Grains Low-sodium cereals, including oats, puffed wheat and rice, and shredded wheat. Low-sodium crackers. Unsalted rice. Unsalted pasta. Low-sodium bread. Whole-grain breads and whole-grain pasta. Vegetables Fresh or frozen vegetables. "No salt added" canned vegetables. "No salt added" tomato sauce and paste. Low-sodium or reduced-sodium tomato and vegetable juice. Fruits Fresh, frozen, or canned fruit. Fruit juice. Meats and other protein foods Fresh or frozen (no salt added) meat, poultry, seafood, and fish. Low-sodium canned tuna and salmon. Unsalted nuts. Dried peas, beans, and lentils without added salt. Unsalted canned beans. Eggs. Unsalted nut butters. Dairy Milk. Soy milk. Cheese that is naturally low in sodium, such as ricotta cheese, fresh mozzarella, or Swiss cheese Low-sodium or reduced-sodium cheese. Cream cheese. Yogurt. Fats and oils Unsalted butter. Unsalted margarine with no trans fat. Vegetable oils such as canola or olive oils. Seasonings and other foods Fresh and dried herbs and spices.  Salt-free seasonings. Low-sodium mustard and ketchup. Sodium-free salad dressing. Sodium-free light mayonnaise. Fresh or refrigerated horseradish. Lemon juice. Vinegar. Homemade, reduced-sodium, or low-sodium soups. Unsalted  popcorn and pretzels. Low-salt or salt-free chips. What foods are not recommended? The items listed may not be a complete list. Talk with your dietitian about what dietary choices are best for you. Grains Instant hot cereals. Bread stuffing, pancake, and biscuit mixes. Croutons. Seasoned rice or pasta mixes. Noodle soup cups. Boxed or frozen macaroni and cheese. Regular salted crackers. Self-rising flour. Vegetables Sauerkraut, pickled vegetables, and relishes. Olives. [place] fries. Onion rings. Regular canned vegetables (not low-sodium or reduced-sodium). Regular canned tomato sauce and paste (not low-sodium or reduced-sodium). Regular tomato and vegetable juice (not low-sodium or reduced-sodium). Frozen vegetables in sauces. Meats and other protein foods Meat or fish that is salted, canned, smoked, spiced, or pickled. Bacon, ham, sausage, hotdogs, corned beef, chipped beef, packaged lunch meats, salt pork, jerky, pickled herring, anchovies, regular canned tuna, sardines, salted nuts. Dairy Processed cheese and cheese spreads. Cheese curds. Blue cheese. Feta cheese. String cheese. Regular cottage cheese. Buttermilk. Canned milk. Fats and oils Salted butter. Regular margarine. Ghee. Bacon fat. Seasonings and other foods Onion salt, garlic salt, seasoned salt, table salt, and sea salt. Canned and packaged gravies. Worcestershire sauce. Tartar sauce. Barbecue sauce. Teriyaki sauce. Soy sauce, including reduced-sodium. Steak sauce. Fish sauce. Oyster sauce. Cocktail sauce. Horseradish that you find on the shelf. Regular ketchup and mustard. Meat flavorings and tenderizers. Bouillon cubes. Hot sauce and Tabasco sauce. Premade or packaged marinades. Premade or packaged taco seasonings. Relishes. Regular salad dressings. Salsa. Potato and tortilla chips. Corn chips and puffs. Salted popcorn and pretzels. Canned or dried soups. Pizza. Frozen entrees and pot pies. Summary  Eating less sodium can help lower  your blood pressure, reduce swelling, and protect your heart, liver, and kidneys.  Most people on this plan should limit their sodium intake to 1,500-2,000 mg (milligrams) of sodium each day.  Canned, boxed, and frozen foods are high in sodium. Restaurant foods, fast foods, and pizza are also very high in sodium. You also get sodium by adding salt to food.  Try to cook at home, eat more fresh fruits and vegetables, and eat less fast food, canned, processed, or prepared foods. This information is not intended to replace advice given to you by your health care provider. Make sure you discuss any questions you have with your health care provider. Document Released: 01/27/2002 Document Revised: 07/31/2016 Document Reviewed: 07/31/2016 Elsevier Interactive Patient Education  2017 [employer].      If you need a refill on your cardiac medications before your next appointment, please call your pharmacy.

## 2017-05-14 NOTE — Progress Notes (Signed)
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  Since last being seen in our clinic, the patient reports doing reasonably well.  Recently visited his son and grandchildren in [place]. He has retained fluid on the trip.  + edema but SOB not much worse than baseline.  Today, he denies symptoms of palpitations, chest pain, dizziness, presyncope, or syncope.  The patient is otherwise without complaint today.   Past Medical History:  Diagnosis Date  . Anemia    04/2012: H&H-10.2/31.5, MCV-77; 06/2012:12/38.9  . Arthritis   . Benign prostatic hypertrophy    s/p transurethral resection of the prostate  . Chest pain    2004; with dyspnea, stress nuclear-normal EF + questionable inferior wall ischemia, normal coronary angiography;  2010-negative stress nuclear  . CVA (cerebral infarction)    asymptomatic (seen on CT)  . Diarrhea    h/o Hemoccult-positive stool  . Dysrhythmia   . Edema of both legs   . GERD (gastroesophageal reflux disease)   . History of blood transfusion   . History of kidney stones   . History of urinary tract infection   . Hypertension   . Jerking movements of extremities    left arm   . Low back pain   . Obstructive sleep apnea    pt states can not use the CPAP  . Peripheral neuropathy    lower legs and feet bilat   . Persistent atrial fibrillation ([place]) 2013   Asymptomatic; diagnosed in 05/2012  . Pneumonia   . Prostate cancer ([place]) 2017   stage 4  . Shortness of breath dyspnea    pt states only develops SOB if tries to do something too quickly  . Upper GI bleed 1985   1985-peptic ulcer disease; initial hemigastrectomy; subsequent Billroth II  . Urinary hesitancy   . Vertigo    ED evaluation-06/2012   Past Surgical History:  Procedure Laterality Date  . Billroth II    . CHOLECYSTECTOMY    . COLONOSCOPY  Approximately 2000  . COLONOSCOPY  10/2012   Colonoscopy March 2014 at [facility], difficulty exam requiring fluoroscopy and overtube. Patient developed severe bradycardia again during his procedure like he did [facility]. Multiple polyps removed which were tubular adenomas. Previously tattooed sites at 20 and 30 cm were free of any polypoid change. Left-sided diverticulosis noted.  . COLONOSCOPY WITH ESOPHAGOGASTRODUODENOSCOPY (EGD)  06-17-2009   YHC:[id number] tubular adenomas and 2 tubulovillous adenomas, incomplete, 3 clips placed  . ESOPHAGOGASTRODUODENOSCOPY  06/17/2009   SLF: normal/chronic gastritis (non-h pylori)  . ESOPHAGOGASTRODUODENOSCOPY N/A 05/11/2015   TDV:VOHYWVP anemai due to postgastrectomy state/moderate erosive gastritis  . INGUINAL HERNIA REPAIR     Left  . ROTATOR CUFF REPAIR  1991   Bilateral  . TRANSURETHRAL RESECTION OF PROSTATE  06/2016  . TRANSURETHRAL RESECTION OF PROSTATE N/A 12/27/2012   Procedure: TRANSURETHRAL RESECTION OF THE PROSTATE (TURP);  Surgeon: Marissa Nestle, MD;  Location: [facility];  Service: Urology;  Laterality: N/A;  . TRANSURETHRAL RESECTION OF PROSTATE N/A 04/11/2016   Procedure: TRANSURETHRAL RESECTION OF THE PROSTATE (TURP);  Surgeon: Irine Seal, MD;  Location: [facility];  Service: Urology;  Laterality: N/A;  . VAGOTOMY AND PYLOROPLASTY  1970s   Details of procedure uncertain; 7106Y    ROS- all systems are reviewed and negatives except as per HPI above  Current Outpatient Prescriptions  Medication Sig Dispense Refill  . clobetasol  cream (TEMOVATE) 0.76 % Apply 1 application topically daily as needed (use as directed for skin).     Marland Kitchen clopidogrel (PLAVIX) 75 MG tablet Take 75 mg by mouth daily.  0  . diazepam (VALIUM) 5 MG tablet Take 1 tablet by mouth twice daily as needed for anxiety  2  . furosemide (LASIX) 40 MG tablet Take 40 mg by mouth daily.    Marland Kitchen HYDROcodone-acetaminophen (NORCO) 10-325 MG tablet TAKE 1 TABLET  FIVE TIMES DAILY AS NEEDED FOR PAIN  0  . hydrOXYzine (ATARAX/VISTARIL)  50 MG tablet Take 1 tablet (50 mg total) by mouth every 6 (six) hours as needed for itching. 30 tablet 0  . losartan (COZAAR) 100 MG tablet Take 1 tablet by mouth daily  2  . meclizine (ANTIVERT) 25 MG tablet Take 25 mg by mouth at bedtime.     Marland Kitchen nystatin (MYCOSTATIN/NYSTOP) 100000 UNIT/GM POWD Apply 1 g topically daily as needed (rash).     . pantoprazole (PROTONIX) 40 MG tablet Take 40 mg by mouth daily.    . potassium chloride SA (K-DUR,KLOR-CON) 20 MEQ tablet Take 1 tablet (20 mEq total) by mouth daily. 30 tablet 12  . triamcinolone cream (KENALOG) 0.1 % Apply 1 application topically daily as needed (for irritation).      No current facility-administered medications for this visit.     Physical Exam: Vitals:   05/14/17 1405  BP: (!) 142/68  Pulse: (!) 55  SpO2: 94%  Weight: 269 lb (122 kg)  Height: 6' (1.829 m)    GEN- The patient is overweight appearing, alert and oriented x 3 today.   Head- normocephalic, atraumatic Eyes-  Sclera clear, conjunctiva pink Ears- hearing intact Oropharynx- clear Neck- + JVD Lungs- few bibasilar rales, normal work of breathing Heart- irregular rate and rhythm, no murmurs, rubs or gallops, PMI not laterally displaced GI- soft, NT, ND, + BS Extremities- no clubbing, cyanosis, +2 chronic edema  EKG tracing ordered today is personally reviewed and shows afib, V rate 53 bpm, inferior scar, anterior infarct also, QTc 410 msec  Assessment and Plan:  1. Longstanding persistent afib Rate controlled Not a candidate for anticoagulation  2. Acute on chronic diastolic dysfunction Lifestyle modification encouraged 2 gram sodium diet Will increase lasix to 40mg  BID x 4 days, then return to 40mg  daily bmet upon return  3. Hypertensive cardiovascular disease Avoid salt  bmet upon return (02/14/17 labs reviewed)  Return to see EP PA-C in 3 months  Thompson Grayer MD, [facility] 05/14/2017 2:29 PM

## 2017-06-03 DIAGNOSIS — I872 Venous insufficiency (chronic) (peripheral): Secondary | ICD-10-CM | POA: Diagnosis not present

## 2017-06-03 DIAGNOSIS — M545 Low back pain: Secondary | ICD-10-CM | POA: Diagnosis not present

## 2017-06-06 ENCOUNTER — Other Ambulatory Visit (HOSPITAL_COMMUNITY): Payer: Self-pay | Admitting: Pulmonary Disease

## 2017-06-06 ENCOUNTER — Ambulatory Visit (HOSPITAL_COMMUNITY)
Admission: RE | Admit: 2017-06-06 | Discharge: 2017-06-06 | Disposition: A | Discharge status: Home / Self Care | Payer: PPO | Source: Ambulatory Visit | Attending: Pulmonary Disease | Admitting: Pulmonary Disease

## 2017-06-06 DIAGNOSIS — R05 Cough: Secondary | ICD-10-CM

## 2017-06-06 DIAGNOSIS — Z85828 Personal history of other malignant neoplasm of skin: Secondary | ICD-10-CM | POA: Diagnosis not present

## 2017-06-06 DIAGNOSIS — I872 Venous insufficiency (chronic) (peripheral): Secondary | ICD-10-CM | POA: Diagnosis not present

## 2017-06-06 DIAGNOSIS — D485 Neoplasm of uncertain behavior of skin: Secondary | ICD-10-CM | POA: Diagnosis not present

## 2017-06-06 DIAGNOSIS — I10 Essential (primary) hypertension: Secondary | ICD-10-CM | POA: Diagnosis not present

## 2017-06-06 DIAGNOSIS — C44619 Basal cell carcinoma of skin of left upper limb, including shoulder: Secondary | ICD-10-CM | POA: Diagnosis not present

## 2017-06-06 DIAGNOSIS — L820 Inflamed seborrheic keratosis: Secondary | ICD-10-CM | POA: Diagnosis not present

## 2017-06-06 DIAGNOSIS — R918 Other nonspecific abnormal finding of lung field: Secondary | ICD-10-CM | POA: Diagnosis not present

## 2017-06-06 DIAGNOSIS — R0602 Shortness of breath: Secondary | ICD-10-CM | POA: Diagnosis not present

## 2017-06-06 DIAGNOSIS — R059 Cough, unspecified: Secondary | ICD-10-CM

## 2017-06-06 DIAGNOSIS — Z23 Encounter for immunization: Secondary | ICD-10-CM | POA: Diagnosis not present

## 2017-06-06 DIAGNOSIS — I482 Chronic atrial fibrillation: Secondary | ICD-10-CM | POA: Diagnosis not present

## 2017-06-06 DIAGNOSIS — L570 Actinic keratosis: Secondary | ICD-10-CM | POA: Diagnosis not present

## 2017-06-06 DIAGNOSIS — L280 Lichen simplex chronicus: Secondary | ICD-10-CM | POA: Diagnosis not present

## 2017-06-13 DIAGNOSIS — I482 Chronic atrial fibrillation: Secondary | ICD-10-CM | POA: Diagnosis not present

## 2017-06-13 DIAGNOSIS — J189 Pneumonia, unspecified organism: Secondary | ICD-10-CM | POA: Diagnosis not present

## 2017-06-13 DIAGNOSIS — J449 Chronic obstructive pulmonary disease, unspecified: Secondary | ICD-10-CM | POA: Diagnosis not present

## 2017-06-13 DIAGNOSIS — I10 Essential (primary) hypertension: Secondary | ICD-10-CM | POA: Diagnosis not present

## 2017-06-28 DIAGNOSIS — C44619 Basal cell carcinoma of skin of left upper limb, including shoulder: Secondary | ICD-10-CM | POA: Diagnosis not present

## 2017-07-04 DIAGNOSIS — M545 Low back pain: Secondary | ICD-10-CM | POA: Diagnosis not present

## 2017-07-04 DIAGNOSIS — I872 Venous insufficiency (chronic) (peripheral): Secondary | ICD-10-CM | POA: Diagnosis not present

## 2017-07-04 DIAGNOSIS — L0100 Impetigo, unspecified: Secondary | ICD-10-CM | POA: Diagnosis not present

## 2017-07-10 DIAGNOSIS — B9562 Methicillin resistant Staphylococcus aureus infection as the cause of diseases classified elsewhere: Secondary | ICD-10-CM | POA: Diagnosis not present

## 2017-08-03 DIAGNOSIS — I872 Venous insufficiency (chronic) (peripheral): Secondary | ICD-10-CM | POA: Diagnosis not present

## 2017-08-03 DIAGNOSIS — M545 Low back pain: Secondary | ICD-10-CM | POA: Diagnosis not present

## 2017-08-19 NOTE — Progress Notes (Signed)
Cardiology Office Note Date:  08/20/2017  Patient ID:  Bernard Ramirez, Bernard Ramirez 09-Mar-1931, MRN [id number] PCP:  Sinda Du, MD  Elecytrophysiologist:  Dr. [name]    Chief Complaint: planned f/u  History of Present Illness: Bernard Ramirez is a 83 y.o. male with history of persistent AFib, not on a/c 2/2 GIB/anemia, CVA, OSA intolerant CPAP,  Prostate cancer, HTN, chronic CHF (diastolic).  He comes today to be seen for Dr. [name], last seen by him in Sept, at that visit is described to have some degree of chronic LEE, though felt to be fluid OL, and his diuretics increased for 4 days and planned to f/u in a few months.  He comes today accompanied by his wife.  He has significant and chronic back pain that has him with a very sedentary life style, does not get up and around much, though reports no DOE with his usual ADLs that he says he can do without difficulty inside the house, walks with the aid of a cane.  Denies any kind of CP or palpitations, no dizziness, near syncope or syncope.  He has been out of town for a few days over Christmas, went without his lasix, and reports likely a few days a week routinely skips it when he knows he will be out of the house, like Sunday's particularly, or days when they have appointments like today.  He will on occasion not trouble breathing when lying down, though not regularly.   Past Medical History:  Diagnosis Date  . Anemia    04/2012: H&H-10.2/31.5, MCV-77; 06/2012:12/38.9  . Arthritis   . Benign prostatic hypertrophy    s/p transurethral resection of the prostate  . Chest pain    2004; with dyspnea, stress nuclear-normal EF + questionable inferior wall ischemia, normal coronary angiography;  2010-negative stress nuclear  . CVA (cerebral infarction)    asymptomatic (seen on CT)  . Diarrhea    h/o Hemoccult-positive stool  . Dysrhythmia   . Edema of both legs   . GERD (gastroesophageal reflux disease)   . History of blood transfusion     . History of kidney stones   . History of urinary tract infection   . Hypertension   . Jerking movements of extremities    left arm   . Low back pain   . Obstructive sleep apnea    pt states can not use the CPAP  . Peripheral neuropathy    lower legs and feet bilat   . Persistent atrial fibrillation ([place]) 2013   Asymptomatic; diagnosed in 05/2012  . Pneumonia   . Prostate cancer ([place]) 2017   stage 4  . Shortness of breath dyspnea    pt states only develops SOB if tries to do something too quickly  . Upper GI bleed 1985   1985-peptic ulcer disease; initial hemigastrectomy; subsequent Billroth II  . Urinary hesitancy   . Vertigo    ED evaluation-06/2012    Past Surgical History:  Procedure Laterality Date  . Billroth II    . CHOLECYSTECTOMY    . COLONOSCOPY  Approximately 2000  . COLONOSCOPY  10/2012   Colonoscopy March 2014 at [facility], difficulty exam requiring fluoroscopy and overtube. Patient developed severe bradycardia again during his procedure like he did [facility]. Multiple polyps removed which were tubular adenomas. Previously tattooed sites at 20 and 30 cm were free of any polypoid change. Left-sided diverticulosis noted.  . COLONOSCOPY WITH ESOPHAGOGASTRODUODENOSCOPY (EGD)  06-17-2009   WER:[id number] tubular adenomas  and 2 tubulovillous adenomas, incomplete, 3 clips placed  . ESOPHAGOGASTRODUODENOSCOPY  06/17/2009   SLF: normal/chronic gastritis (non-h pylori)  . ESOPHAGOGASTRODUODENOSCOPY N/A 05/11/2015   KXF:GHWEXHB anemai due to postgastrectomy state/moderate erosive gastritis  . INGUINAL HERNIA REPAIR     Left  . ROTATOR CUFF REPAIR  1991   Bilateral  . TRANSURETHRAL RESECTION OF PROSTATE  06/2016  . TRANSURETHRAL RESECTION OF PROSTATE N/A 12/27/2012   Procedure: TRANSURETHRAL RESECTION OF THE PROSTATE (TURP);  Surgeon: Marissa Nestle, MD;  Location: [facility];  Service: Urology;  Laterality: N/A;  . TRANSURETHRAL RESECTION OF PROSTATE N/A 04/11/2016    Procedure: TRANSURETHRAL RESECTION OF THE PROSTATE (TURP);  Surgeon: Irine Seal, MD;  Location: [facility];  Service: Urology;  Laterality: N/A;  . VAGOTOMY AND PYLOROPLASTY  1970s   Details of procedure uncertain; 7169C    Current Outpatient Medications  Medication Sig Dispense Refill  . clobetasol cream (TEMOVATE) 7.89 % Apply 1 application topically daily as needed (use as directed for skin).     Marland Kitchen clopidogrel (PLAVIX) 75 MG tablet Take 75 mg by mouth daily.  0  . diazepam (VALIUM) 5 MG tablet Take 1 tablet by mouth twice daily as needed for anxiety  2  . furosemide (LASIX) 40 MG tablet Take 40mg  by mouth daily and as needed for fluid overload. 45 tablet 11  . HYDROcodone-acetaminophen (NORCO) 10-325 MG tablet TAKE 1 TABLET  FIVE TIMES DAILY AS NEEDED FOR PAIN  0  . hydrOXYzine (ATARAX/VISTARIL) 50 MG tablet Take 1 tablet (50 mg total) by mouth every 6 (six) hours as needed for itching. 30 tablet 0  . losartan (COZAAR) 100 MG tablet Take 1 tablet by mouth daily  2  . meclizine (ANTIVERT) 25 MG tablet Take 25 mg by mouth at bedtime.     Marland Kitchen nystatin (MYCOSTATIN/NYSTOP) 100000 UNIT/GM POWD Apply 1 g topically daily as needed (rash).     . pantoprazole (PROTONIX) 40 MG tablet Take 40 mg by mouth daily.    . potassium chloride SA (K-DUR,KLOR-CON) 20 MEQ tablet Take 1 tablet (20 mEq total) by mouth daily. 30 tablet 12  . triamcinolone cream (KENALOG) 0.1 % Apply 1 application topically daily as needed (for irritation).      No current facility-administered medications for this visit.     Allergies:   Contrast media [iodinated diagnostic agents]; Gabapentin; Aspirin; Latex; Lipitor [atorvastatin]; and Tape   Social History:  The patient  reports that he quit smoking about 14 years ago. His smoking use included cigars. He quit after 12.00 years of use. he has never used smokeless tobacco. He reports that he does not drink alcohol or use drugs.   Family History:  The patient's family history includes  Allergic rhinitis in his father and mother; Diabetes Mellitus II in his mother; Lung disease in his father.  ROS:  Please see the history of present illness.   All other systems are reviewed and otherwise negative.   PHYSICAL EXAM:  VS:  BP (!) 154/80   Pulse 85   Ht 6' (1.829 m)   Wt 273 lb (123.8 kg)   BMI 37.03 kg/m  BMI: Body mass index is 37.03 kg/m. Well nourished, well developed, in no acute distress  HEENT: normocephalic, atraumatic  Neck: no JVD, carotid bruits or masses Cardiac:  iRRR; soft SM, no rubs, or gallops Lungs:  CTA b/l, no wheezing, rhonchi or rales  Abd: soft, non-tender, obese MS: no deformity, age appropriate atrophy Ext: 2+ edema to mid  shin, slight erythema b/l Skin: warm and dry, no rash Neuro:  No gross deficits appreciated Psych: euthymic mood, full affect   EKG:  Not done today  10/08/16: TTE Study Conclusions - Left ventricle: The cavity size was normal. Wall thickness was   increased in a pattern of mild LVH. Systolic function was normal.   The estimated ejection fraction was in the range of 60% to 65%.   Images were inadequate for LV wall motion assessment. The study   was not technically sufficient to allow evaluation of LV   diastolic dysfunction due to atrial fibrillation. - Aortic valve: Moderately calcified annulus. Moderately thickened,   moderately calcified leaflets. Morphologically, there appears to   be at least mild aortic stenosis. - Mitral valve: Calcified annulus. Mildly thickened leaflets . - Left atrium: The atrium was moderately dilated. - Right ventricle: The cavity size was moderately dilated. Systolic   function was mildly to moderately reduced. - Right atrium: The atrium was moderately dilated. - Tricuspid valve: There was mild regurgitation. - Pulmonary arteries: PA peak pressure: 46 mm Hg (S). - Systemic veins: Dilated IVC with normal respiratory variation.   Estimated CVP 8 mmHg.  10/08/16: LE venous  US IMPRESSION: No evidence of deep venous thrombosis.  10/07/15: stress myovoiew  Nuclear stress EF: 55%.  There was no ST segment deviation noted during stress.  There is a small defect of mild severity present in the basal inferior and basal inferolateral location. The defect is non-reversible and consistent with diaphragmatic attenuation and extra cardiac activity. No ischemia noted.  This is a low risk study.  The left ventricular ejection fraction is normal (55-65%).    Recent Labs: 10/08/2016: B Natriuretic Peptide 69.0 11/15/2016: Hemoglobin 11.0; NT-Pro BNP 329; Platelets 243 02/14/2017: BUN 15; Creatinine, Ser 1.07; Potassium 5.1; Sodium 142  No results found for requested labs within last 8760 hours.   CrCl cannot be calculated (Patient's most recent lab result is older than the maximum 21 days allowed.).   Wt Readings from Last 3 Encounters:  08/20/17 273 lb (123.8 kg)  05/14/17 269 lb (122 kg)  02/14/17 261 lb 9.6 oz (118.7 kg)     Other studies reviewed: Additional studies/records reviewed today include: summarized above  ASSESSMENT AND PLAN:  1. Longstanding persistent AFib     CHA2DS2Vasc is 3, though not felt to be a/c candidate 2/2 GIB     rate controlled  2. Hypertensive heart disease     HFpEF     edematous today, we discussed at length importance of his lasix, also discussed minimizing sodium which sounds like he is pretty liberal with  Take his lasix BID for 4 days, then daily, BMET today  3. HTN     A little high today, no changes other then his diuretics as above   Disposition: Will keep an eye on him, see him back in 2 months, sooner if needed  Current medicines are reviewed at length with the patient today.  The patient did not have any concerns regarding medicines.  Venetia Night, PA-C 08/20/2017 12:17 PM     [address] [address] [phone] (office)  [phone]  (fax)

## 2017-08-20 ENCOUNTER — Ambulatory Visit: Payer: PPO | Admitting: Physician Assistant

## 2017-08-20 VITALS — BP 154/80 | HR 85 | Ht 72.0 in | Wt 273.0 lb

## 2017-08-20 DIAGNOSIS — I481 Persistent atrial fibrillation: Secondary | ICD-10-CM

## 2017-08-20 DIAGNOSIS — Z79899 Other long term (current) drug therapy: Secondary | ICD-10-CM

## 2017-08-20 DIAGNOSIS — I4819 Other persistent atrial fibrillation: Secondary | ICD-10-CM

## 2017-08-20 DIAGNOSIS — I110 Hypertensive heart disease with heart failure: Secondary | ICD-10-CM | POA: Diagnosis not present

## 2017-08-20 DIAGNOSIS — I5032 Chronic diastolic (congestive) heart failure: Secondary | ICD-10-CM | POA: Diagnosis not present

## 2017-08-20 NOTE — Patient Instructions (Addendum)
Medication Instructions:   TAKE LASIX TWICE A DAY FOR 4 DAYS THEN RESUME TAKING ONCE A DAY   If you need a refill on your cardiac medications before your next appointment, please call your pharmacy.  Labwork:BMET TODAY    Testing/Procedures:  NONE ORDERED  TODAY    Follow-Up:  IN 2 MONTHS WITH URSUY   FEBRUARY  12 11:45 AM    Any Other Special Instructions Will Be Listed Below (If Applicable).

## 2017-08-21 LAB — BASIC METABOLIC PANEL
BUN/Creatinine Ratio: 13 (ref 10–24)
BUN: 13 mg/dL (ref 8–27)
CALCIUM: 9.4 mg/dL (ref 8.6–10.2)
CHLORIDE: 104 mmol/L (ref 96–106)
CO2: 24 mmol/L (ref 20–29)
Creatinine, Ser: 1.01 mg/dL (ref 0.76–1.27)
GFR calc non Af Amer: 68 mL/min/{1.73_m2} (ref >59)
GFR, EST AFRICAN AMERICAN: 79 mL/min/{1.73_m2} (ref >59)
Glucose: 120 mg/dL — ABNORMAL HIGH (ref 65–99)
Potassium: 4.6 mmol/L (ref 3.5–5.2)
Sodium: 144 mmol/L (ref 134–144)

## 2017-08-27 ENCOUNTER — Telehealth: Payer: Self-pay | Admitting: *Deleted

## 2017-08-27 NOTE — Telephone Encounter (Signed)
LMOVM OF STABLE RESULTS AND TO CONTACT CLINIC IF HAVE ANY QUESTIONS OR CONCERNS.

## 2017-08-27 NOTE — Telephone Encounter (Signed)
-----   Message from [facility], [place] sent at 08/21/2017  1:28 PM EST ----- Lab looks OK, to continue lasix as we discussed at his visit.    Thanks renee

## 2017-09-03 DIAGNOSIS — I872 Venous insufficiency (chronic) (peripheral): Secondary | ICD-10-CM | POA: Diagnosis not present

## 2017-09-03 DIAGNOSIS — M545 Low back pain: Secondary | ICD-10-CM | POA: Diagnosis not present

## 2017-09-11 DIAGNOSIS — I10 Essential (primary) hypertension: Secondary | ICD-10-CM | POA: Diagnosis not present

## 2017-09-11 DIAGNOSIS — I482 Chronic atrial fibrillation: Secondary | ICD-10-CM | POA: Diagnosis not present

## 2017-09-11 DIAGNOSIS — I5032 Chronic diastolic (congestive) heart failure: Secondary | ICD-10-CM | POA: Diagnosis not present

## 2017-09-11 DIAGNOSIS — Z79891 Long term (current) use of opiate analgesic: Secondary | ICD-10-CM | POA: Diagnosis not present

## 2017-09-11 DIAGNOSIS — M25562 Pain in left knee: Secondary | ICD-10-CM | POA: Diagnosis not present

## 2017-09-30 NOTE — Progress Notes (Signed)
Cardiology Office Note Date:  10/02/2017  Patient ID:  Bernard Ramirez, Bernard Ramirez 05-15-1931, MRN [id number] PCP:  Sinda Du, MD  Elecytrophysiologist:  Dr. [name]   Chief Complaint: planned f/u  History of Present Illness: Bernard Ramirez is a 84 y.o. male with history of persistent AFib, not on a/c 2/2 GIB/anemia, CVA, OSA intolerant CPAP,  Prostate cancer, HTN, chronic CHF (diastolic).  He comes today to be seen for Dr. [name], last seen by him in Sept, at that visit is described to have some degree of chronic LEE, though felt to be fluid OL, and his diuretics increased for 4 days and planned to f/u in a few months.  I saw him 08/20/17, he was accompanied by his wife.  He has significant and chronic back pain that has him with a very sedentary life style, does not get up and around much, though reported no DOE with his usual ADLs that he says he can do without difficulty inside the house, walks with the aid of a cane.  Denied any kind of CP or palpitations, no dizziness, near syncope or syncope.  He had been out of town for a few days over Christmas, went without his lasix, and reported likely a few days a week routinely skips it when he knows he will be out of the house, like Sunday's particularly, or days when they have appointments like today.  He mentioned on occasion not trouble breathing when lying down, though not regularly.  He was instructed to take extra lasix for 4 days, BMET was ordered (and OK for additional diuresis) and planned for  2 mo f/u.  He is doing about the same.  No rest SOB, no symptoms of PND or orthopnea.  He reports when he took extra lasix he didi feel like his urin out put increased and started to see a little decrease in swelling, but comes right back when resumed usual dose.  He is taking his lasix daily and about 2 days a week an extra dose, reports skipping his dose rarely now.  He sounds like he still has room to improve on sodium in his diet.  He reports  daily weights at home have been stable without significant or persistant gain.  (he is 4 pounds up from last visit)  He has DOE, mentions that his back and knee pain are issues in his ability to get around as well. When asked about duration of his DOE he says years, and about the same.  His wife mentions he spends most of his days laying in bed, when asked why he says that is where he is most comfortable, preferring supine to seated.  He is encouraged to move more to his ability, both exertional and from an orthopedic standpoint.  Past Medical History:  Diagnosis Date  . Anemia    04/2012: H&H-10.2/31.5, MCV-77; 06/2012:12/38.9  . Arthritis   . Benign prostatic hypertrophy    s/p transurethral resection of the prostate  . Chest pain    2004; with dyspnea, stress nuclear-normal EF + questionable inferior wall ischemia, normal coronary angiography;  2010-negative stress nuclear  . CVA (cerebral infarction)    asymptomatic (seen on CT)  . Diarrhea    h/o Hemoccult-positive stool  . Dysrhythmia   . Edema of both legs   . GERD (gastroesophageal reflux disease)   . History of blood transfusion   . History of kidney stones   . History of urinary tract infection   . Hypertension   .  Jerking movements of extremities    left arm   . Low back pain   . Obstructive sleep apnea    pt states can not use the CPAP  . Peripheral neuropathy    lower legs and feet bilat   . Persistent atrial fibrillation ([place]) 2013   Asymptomatic; diagnosed in 05/2012  . Pneumonia   . Prostate cancer ([place]) 2017   stage 4  . Shortness of breath dyspnea    pt states only develops SOB if tries to do something too quickly  . Upper GI bleed 1985   1985-peptic ulcer disease; initial hemigastrectomy; subsequent Billroth II  . Urinary hesitancy   . Vertigo    ED evaluation-06/2012    Past Surgical History:  Procedure Laterality Date  . Billroth II    . CHOLECYSTECTOMY    . COLONOSCOPY  Approximately 2000  .  COLONOSCOPY  10/2012   Colonoscopy March 2014 at [facility], difficulty exam requiring fluoroscopy and overtube. Patient developed severe bradycardia again during his procedure like he did [facility]. Multiple polyps removed which were tubular adenomas. Previously tattooed sites at 20 and 30 cm were free of any polypoid change. Left-sided diverticulosis noted.  . COLONOSCOPY WITH ESOPHAGOGASTRODUODENOSCOPY (EGD)  06-17-2009   VVO:[id number] tubular adenomas and 2 tubulovillous adenomas, incomplete, 3 clips placed  . ESOPHAGOGASTRODUODENOSCOPY  06/17/2009   SLF: normal/chronic gastritis (non-h pylori)  . ESOPHAGOGASTRODUODENOSCOPY N/A 05/11/2015   GYI:RSWNIOE anemai due to postgastrectomy state/moderate erosive gastritis  . INGUINAL HERNIA REPAIR     Left  . ROTATOR CUFF REPAIR  1991   Bilateral  . TRANSURETHRAL RESECTION OF PROSTATE  06/2016  . TRANSURETHRAL RESECTION OF PROSTATE N/A 12/27/2012   Procedure: TRANSURETHRAL RESECTION OF THE PROSTATE (TURP);  Surgeon: Marissa Nestle, MD;  Location: [facility];  Service: Urology;  Laterality: N/A;  . TRANSURETHRAL RESECTION OF PROSTATE N/A 04/11/2016   Procedure: TRANSURETHRAL RESECTION OF THE PROSTATE (TURP);  Surgeon: Irine Seal, MD;  Location: [facility];  Service: Urology;  Laterality: N/A;  . VAGOTOMY AND PYLOROPLASTY  1970s   Details of procedure uncertain; 7035K    Current Outpatient Medications  Medication Sig Dispense Refill  . clobetasol cream (TEMOVATE) 0.93 % Apply 1 application topically daily as needed (use as directed for skin).     Marland Kitchen clopidogrel (PLAVIX) 75 MG tablet Take 75 mg by mouth daily.  0  . diazepam (VALIUM) 5 MG tablet Take 1 tablet by mouth twice daily as needed for anxiety  2  . furosemide (LASIX) 40 MG tablet Take 40mg  by mouth daily and as needed for fluid overload. (Patient taking differently: Take 40mg  by mouth daily) 45 tablet 11  . HYDROcodone-acetaminophen (NORCO) 10-325 MG tablet TAKE 1 TABLET  FIVE TIMES DAILY AS  NEEDED FOR PAIN  0  . hydrOXYzine (ATARAX/VISTARIL) 50 MG tablet Take 1 tablet (50 mg total) by mouth every 6 (six) hours as needed for itching. 30 tablet 0  . losartan (COZAAR) 100 MG tablet Take 1 tablet by mouth daily  2  . meclizine (ANTIVERT) 25 MG tablet Take 25 mg by mouth at bedtime.     Marland Kitchen nystatin (MYCOSTATIN/NYSTOP) 100000 UNIT/GM POWD Apply 1 g topically daily as needed (rash).     . pantoprazole (PROTONIX) 40 MG tablet Take 40 mg by mouth daily.    . potassium chloride SA (K-DUR,KLOR-CON) 20 MEQ tablet Take 1 tablet (20 mEq total) by mouth daily. 30 tablet 12  . triamcinolone cream (KENALOG) 0.1 % Apply 1 application  topically daily as needed (for irritation).      No current facility-administered medications for this visit.     Allergies:   Contrast media [iodinated diagnostic agents]; Gabapentin; Aspirin; Latex; Lipitor [atorvastatin]; and Tape   Social History:  The patient  reports that he quit smoking about 14 years ago. His smoking use included cigars. He quit after 12.00 years of use. he has never used smokeless tobacco. He reports that he does not drink alcohol or use drugs.   Family History:  The patient's family history includes Allergic rhinitis in his father and mother; Diabetes Mellitus II in his mother; Lung disease in his father.  ROS:  Please see the history of present illness.   All other systems are reviewed and otherwise negative.   PHYSICAL EXAM:  VS:  BP 136/78   Pulse 61   Ht 6' (1.829 m)   Wt 277 lb 12 oz (126 kg)   SpO2 90%   BMI 37.67 kg/m  BMI: Body mass index is 37.67 kg/m. Well nourished, well developed, in no acute distress  HEENT: normocephalic, atraumatic  Neck: no JVD, carotid bruits or masses Cardiac:  iRRR; soft SM, no rubs, or gallops Lungs:  CTA b/l, no wheezing, rhonchi or rales  Abd: soft, non-tender, obese MS: no deformity, age appropriate atrophy Ext: 2+ edema to mid shin, slight erythema b/l, no wounds Skin: warm and dry, no  rash Neuro:  No gross deficits appreciated Psych: euthymic mood, full affect   EKG:  Not done today  10/08/16: TTE Study Conclusions - Left ventricle: The cavity size was normal. Wall thickness was   increased in a pattern of mild LVH. Systolic function was normal.   The estimated ejection fraction was in the range of 60% to 65%.   Images were inadequate for LV wall motion assessment. The study   was not technically sufficient to allow evaluation of LV   diastolic dysfunction due to atrial fibrillation. - Aortic valve: Moderately calcified annulus. Moderately thickened,   moderately calcified leaflets. Morphologically, there appears to   be at least mild aortic stenosis. - Mitral valve: Calcified annulus. Mildly thickened leaflets . - Left atrium: The atrium was moderately dilated. - Right ventricle: The cavity size was moderately dilated. Systolic   function was mildly to moderately reduced. - Right atrium: The atrium was moderately dilated. - Tricuspid valve: There was mild regurgitation. - Pulmonary arteries: PA peak pressure: 46 mm Hg (S). - Systemic veins: Dilated IVC with normal respiratory variation.   Estimated CVP 8 mmHg.  10/08/16: LE venous US IMPRESSION: No evidence of deep venous thrombosis.  10/07/15: stress myovoiew  Nuclear stress EF: 55%.  There was no ST segment deviation noted during stress.  There is a small defect of mild severity present in the basal inferior and basal inferolateral location. The defect is non-reversible and consistent with diaphragmatic attenuation and extra cardiac activity. No ischemia noted.  This is a low risk study.  The left ventricular ejection fraction is normal (55-65%).    Recent Labs: 10/08/2016: B Natriuretic Peptide 69.0 11/15/2016: Hemoglobin 11.0; NT-Pro BNP 329; Platelets 243 08/20/2017: BUN 13; Creatinine, Ser 1.01; Potassium 4.6; Sodium 144  No results found for requested labs within last 8760 hours.   CrCl  cannot be calculated (Patient's most recent lab result is older than the maximum 21 days allowed.).   Wt Readings from Last 3 Encounters:  10/02/17 277 lb 12 oz (126 kg)  08/20/17 273 lb (123.8 kg)  05/14/17 269  lb (122 kg)     Other studies reviewed: Additional studies/records reviewed today include: summarized above  ASSESSMENT AND PLAN:  1. Longstanding persistent AFib     CHA2DS2Vasc is 3, though not felt to be a/c candidate 2/2 GIB     rate controlled  2. Hypertensive heart disease     HFpEF     Edema persists  DOE sounds like it has been a slow progression "over the years", no PND or orthopnea, no rest SOB Very sedentary life  Increase his lasix to 40mg  BID for a week, then go to 40AM, 20PM (60mg  total), increase his K+ to 44meq BID for the week then reduce to 20AM, 10PM. BMET in 6-7days Discussed d/recommended support stockings though e resists, says they are too hard to get on  F/u in 2-3 weeks  We had a lengthy discussion about sodium restriction, he is weighting daily in the AM rports stable weights.  Continue to monitor  3. HTN     Looks OK   Disposition: as above  Current medicines are reviewed at length with the patient today.  The patient did not have any concerns regarding medicines.  Venetia Night, PA-C 10/02/2017 12:49 PM     [place] [address] [phone] (office)  [phone] (fax)

## 2017-10-02 ENCOUNTER — Encounter: Payer: Self-pay | Admitting: Physician Assistant

## 2017-10-02 ENCOUNTER — Ambulatory Visit: Payer: PPO | Admitting: Physician Assistant

## 2017-10-02 VITALS — BP 136/78 | HR 61 | Ht 72.0 in | Wt 277.8 lb

## 2017-10-02 DIAGNOSIS — Z79899 Other long term (current) drug therapy: Secondary | ICD-10-CM | POA: Diagnosis not present

## 2017-10-02 DIAGNOSIS — I10 Essential (primary) hypertension: Secondary | ICD-10-CM

## 2017-10-02 DIAGNOSIS — I5030 Unspecified diastolic (congestive) heart failure: Secondary | ICD-10-CM

## 2017-10-02 DIAGNOSIS — I4819 Other persistent atrial fibrillation: Secondary | ICD-10-CM

## 2017-10-02 DIAGNOSIS — I481 Persistent atrial fibrillation: Secondary | ICD-10-CM | POA: Diagnosis not present

## 2017-10-02 MED ORDER — FUROSEMIDE 40 MG PO TABS: 60.0000 mg | ORAL_TABLET | Freq: Every day | ORAL | 6 refills | Status: DC

## 2017-10-02 MED ORDER — POTASSIUM CHLORIDE CRYS ER 20 MEQ PO TBCR: 30.0000 meq | EXTENDED_RELEASE_TABLET | Freq: Every day | ORAL | 6 refills | Status: DC

## 2017-10-02 NOTE — Patient Instructions (Addendum)
Medication Instructions:   FOR ONE WEEK  ONLY :  TAKE  LASIX 40 MG TWICE A DAY    TAKE  POTASSIUM  20 MEQ TWICE DAY   AFTER ONE WEEK :  TAKE  LASIX 60 MG  ONCE  A DAY :  ONE TABLET IN THE AM 1/2 TABLET IN THE PM   TAKE  POTASSIUM  30 MEQ  ONCE A  DAY  :  ONE TABLET  IN THE AM  1/2 TABLET IN THE PM     If you need a refill on your cardiac medications before your next appointment, please call your pharmacy.  Labwork: RETURN FOR A BMET IN A WEEK     Testing/Procedures: NONE ORDERED  TODAY    Follow-Up: WITH URUSY IN 2 TO 3 WEEKS    Any Other Special Instructions Will Be Listed Below (If Applicable).

## 2017-10-09 ENCOUNTER — Encounter (INDEPENDENT_AMBULATORY_CARE_PROVIDER_SITE_OTHER): Payer: Self-pay

## 2017-10-09 ENCOUNTER — Other Ambulatory Visit: Payer: PPO

## 2017-10-09 DIAGNOSIS — Z79899 Other long term (current) drug therapy: Secondary | ICD-10-CM

## 2017-10-10 LAB — BASIC METABOLIC PANEL
BUN/Creatinine Ratio: 13 (ref 10–24)
BUN: 16 mg/dL (ref 8–27)
CALCIUM: 9.4 mg/dL (ref 8.6–10.2)
CO2: 25 mmol/L (ref 20–29)
CREATININE: 1.2 mg/dL (ref 0.76–1.27)
Chloride: 103 mmol/L (ref 96–106)
GFR, EST AFRICAN AMERICAN: 64 mL/min/{1.73_m2} (ref >59)
GFR, EST NON AFRICAN AMERICAN: 55 mL/min/{1.73_m2} — AB (ref >59)
Glucose: 125 mg/dL — ABNORMAL HIGH (ref 65–99)
Potassium: 5.1 mmol/L (ref 3.5–5.2)
Sodium: 142 mmol/L (ref 134–144)

## 2017-10-16 ENCOUNTER — Telehealth: Payer: Self-pay | Admitting: *Deleted

## 2017-10-16 NOTE — Telephone Encounter (Signed)
LMVOM TO CALL BACK  FOR RESULTS AND MEDICATION RECOMMENDATIONS

## 2017-10-16 NOTE — Telephone Encounter (Signed)
-----   Message from [place], [place] sent at 10/10/2017  6:27 PM EST ----- Lab looks OK.  His potassium level is OK, higher end of normal, he likely does not need the additional 7meq potassium dose at night.  Continue with only the 20meq tab daily of potassium.  Thanks renee

## 2017-10-17 ENCOUNTER — Telehealth: Payer: Self-pay | Admitting: *Deleted

## 2017-10-17 NOTE — Telephone Encounter (Signed)
-----   Message from [place], [place] sent at 10/10/2017  6:27 PM EST ----- Lab looks OK.  His potassium level is OK, higher end of normal, he likely does not need the additional 43meq potassium dose at night.  Continue with only the 59meq tab daily of potassium.  Thanks renee

## 2017-10-17 NOTE — Telephone Encounter (Signed)
ERROR

## 2017-10-17 NOTE — Progress Notes (Signed)
Cardiology Office Note Date:  10/18/2017  Patient ID:  Bernard Ramirez, Bernard Ramirez 26-Jan-1931, MRN [id number] PCP:  Sinda Du, MD  Elecytrophysiologist:  Dr. [name]   Chief Complaint: planned f/u  History of Present Illness: Bernard Ramirez is a 84 y.o. male with history of persistent AFib, not on a/c 2/2 GIB/anemia, CVA, OSA intolerant CPAP,  Prostate cancer, HTN, chronic CHF (diastolic).  He comes today to be seen for Dr. [name], last seen by him in Sept, at that visit is described to have some degree of chronic LEE, though felt to be fluid OL, and his diuretics increased for 4 days and planned to f/u in a few months.  I saw him 08/20/17, he was accompanied by his wife.  He has significant and chronic back pain that has him with a very sedentary life style, does not get up and around much, though reported no DOE with his usual ADLs that he says he can do without difficulty inside the house, walks with the aid of a cane.  Denied any kind of CP or palpitations, no dizziness, near syncope or syncope.  He had been out of town for a few days over Christmas, went without his lasix, and reported likely a few days a week routinely skips it when he knows he will be out of the house, like Sunday's particularly, or days when they have appointments like today.  He mentioned on occasion not trouble breathing when lying down, though not regularly.  He was instructed to take extra lasix for 4 days, BMET was ordered (and OK for additional diuresis) and planned for  2 mo f/u.  I saw him in f/u a couple weeks ago, he was doing about the same.  No rest SOB, no symptoms of PND or orthopnea.  He reported when he took extra lasix he did feel like his urine out put increased and started to see a little decrease in swelling, but comes right back when resumed usual dose.  He was taking his lasix daily and about 2 days a week an extra dose, reported skipping his dose rarely now.  He sounded like he still had room to  improve on sodium in his diet.  He reported daily weights at home have been stable without significant or persistant gain.  (he was 4 pounds up from last visit)  He had DOE, mentioned that his back and knee pain are issues in his ability to get around as well. When asked about duration of his DOE he says years, and about the same.  His wife mentioned he spends most of his days laying in bed, when asked why he says that is where he is most comfortable, preferring supine to seated.  He was encouraged to move more to his ability, both exertional and from an orthopedic standpoint.  We increased his lasix to BID for a week and then 40AM, 20PM.  His weight is down 10lbs, edema is improved, though remains edematous.  No rest SOB, his DOE is at what he says is his baseline,  No symptoms of PND or orthopnea.  No CP, palpitations, no dizziness, near syncope or syncope.  Past Medical History:  Diagnosis Date  . Anemia    04/2012: H&H-10.2/31.5, MCV-77; 06/2012:12/38.9  . Arthritis   . Benign prostatic hypertrophy    s/p transurethral resection of the prostate  . Chest pain    2004; with dyspnea, stress nuclear-normal EF + questionable inferior wall ischemia, normal coronary angiography;  2010-negative stress  nuclear  . CVA (cerebral infarction)    asymptomatic (seen on CT)  . Diarrhea    h/o Hemoccult-positive stool  . Dysrhythmia   . Edema of both legs   . GERD (gastroesophageal reflux disease)   . History of blood transfusion   . History of kidney stones   . History of urinary tract infection   . Hypertension   . Jerking movements of extremities    left arm   . Low back pain   . Obstructive sleep apnea    pt states can not use the CPAP  . Peripheral neuropathy    lower legs and feet bilat   . Persistent atrial fibrillation ([place]) 2013   Asymptomatic; diagnosed in 05/2012  . Pneumonia   . Prostate cancer ([place]) 2017   stage 4  . Shortness of breath dyspnea    pt states only develops SOB if  tries to do something too quickly  . Upper GI bleed 1985   1985-peptic ulcer disease; initial hemigastrectomy; subsequent Billroth II  . Urinary hesitancy   . Vertigo    ED evaluation-06/2012    Past Surgical History:  Procedure Laterality Date  . Billroth II    . CHOLECYSTECTOMY    . COLONOSCOPY  Approximately 2000  . COLONOSCOPY  10/2012   Colonoscopy March 2014 at [facility], difficulty exam requiring fluoroscopy and overtube. Patient developed severe bradycardia again during his procedure like he did [facility]. Multiple polyps removed which were tubular adenomas. Previously tattooed sites at 20 and 30 cm were free of any polypoid change. Left-sided diverticulosis noted.  . COLONOSCOPY WITH ESOPHAGOGASTRODUODENOSCOPY (EGD)  06-17-2009   GUR:[id number] tubular adenomas and 2 tubulovillous adenomas, incomplete, 3 clips placed  . ESOPHAGOGASTRODUODENOSCOPY  06/17/2009   SLF: normal/chronic gastritis (non-h pylori)  . ESOPHAGOGASTRODUODENOSCOPY N/A 05/11/2015   SEG:BTDVVOH anemai due to postgastrectomy state/moderate erosive gastritis  . INGUINAL HERNIA REPAIR     Left  . ROTATOR CUFF REPAIR  1991   Bilateral  . TRANSURETHRAL RESECTION OF PROSTATE  06/2016  . TRANSURETHRAL RESECTION OF PROSTATE N/A 12/27/2012   Procedure: TRANSURETHRAL RESECTION OF THE PROSTATE (TURP);  Surgeon: Marissa Nestle, MD;  Location: [facility];  Service: Urology;  Laterality: N/A;  . TRANSURETHRAL RESECTION OF PROSTATE N/A 04/11/2016   Procedure: TRANSURETHRAL RESECTION OF THE PROSTATE (TURP);  Surgeon: Irine Seal, MD;  Location: [facility];  Service: Urology;  Laterality: N/A;  . VAGOTOMY AND PYLOROPLASTY  1970s   Details of procedure uncertain; 6073X    Current Outpatient Medications  Medication Sig Dispense Refill  . clobetasol cream (TEMOVATE) 1.06 % Apply 1 application topically daily as needed (use as directed for skin).     Marland Kitchen clopidogrel (PLAVIX) 75 MG tablet Take 75 mg by mouth daily.  0  . diazepam  (VALIUM) 5 MG tablet Take 1 tablet by mouth twice daily as needed for anxiety  2  . furosemide (LASIX) 40 MG tablet Take 1.5 tablets (60 mg total) by mouth daily. TAKE TABLET IN THE AM  1/2 TABLET IN THE PM 45 tablet 6  . HYDROcodone-acetaminophen (NORCO) 10-325 MG tablet TAKE 1 TABLET  FIVE TIMES DAILY AS NEEDED FOR PAIN  0  . hydrOXYzine (ATARAX/VISTARIL) 50 MG tablet Take 1 tablet (50 mg total) by mouth every 6 (six) hours as needed for itching. 30 tablet 0  . losartan (COZAAR) 100 MG tablet Take 1 tablet by mouth daily  2  . meclizine (ANTIVERT) 25 MG tablet Take 25 mg by mouth  at bedtime.     Marland Kitchen nystatin (MYCOSTATIN/NYSTOP) 100000 UNIT/GM POWD Apply 1 g topically daily as needed (rash).     . pantoprazole (PROTONIX) 40 MG tablet Take 40 mg by mouth daily.    . potassium chloride SA (K-DUR,KLOR-CON) 20 MEQ tablet Take 1.5 tablets (30 mEq total) by mouth daily. TAKE A TABLET IN THE AM TAKE 1/2 TABLET IN THE PM 45 tablet 6  . triamcinolone cream (KENALOG) 0.1 % Apply 1 application topically daily as needed (for irritation).      No current facility-administered medications for this visit.     Allergies:   Contrast media [iodinated diagnostic agents]; Gabapentin; Aspirin; Latex; Lipitor [atorvastatin]; and Tape   Social History:  The patient  reports that he quit smoking about 14 years ago. His smoking use included cigars. He quit after 12.00 years of use. he has never used smokeless tobacco. He reports that he does not drink alcohol or use drugs.   Family History:  The patient's family history includes Allergic rhinitis in his father and mother; Diabetes Mellitus II in his mother; Lung disease in his father.  ROS:  Please see the history of present illness.   All other systems are reviewed and otherwise negative.   PHYSICAL EXAM:  VS:  BP 128/68   Pulse 78   Ht 6' (1.829 m)   Wt 267 lb (121.1 kg)   SpO2 93%   BMI 36.21 kg/m  BMI: Body mass index is 36.21 kg/m. Well nourished, well  developed, in no acute distress  HEENT: normocephalic, atraumatic  Neck: no JVD, carotid bruits or masses Cardiac:  iRRR; soft SM, no rubs, or gallops Lungs:  CTA b/l, no wheezing, rhonchi or rales  Abd: soft, non-tender, obese MS: no deformity, age appropriate atrophy Ext: 1-+ edema to mid shin, not as tight as prior visit, mild erythema no wounds Skin: warm and dry, no rash Neuro:  No gross deficits appreciated Psych: euthymic mood, full affect   EKG:  Not done today  10/08/16: TTE Study Conclusions - Left ventricle: The cavity size was normal. Wall thickness was   increased in a pattern of mild LVH. Systolic function was normal.   The estimated ejection fraction was in the range of 60% to 65%.   Images were inadequate for LV wall motion assessment. The study   was not technically sufficient to allow evaluation of LV   diastolic dysfunction due to atrial fibrillation. - Aortic valve: Moderately calcified annulus. Moderately thickened,   moderately calcified leaflets. Morphologically, there appears to   be at least mild aortic stenosis. - Mitral valve: Calcified annulus. Mildly thickened leaflets . - Left atrium: The atrium was moderately dilated. - Right ventricle: The cavity size was moderately dilated. Systolic   function was mildly to moderately reduced. - Right atrium: The atrium was moderately dilated. - Tricuspid valve: There was mild regurgitation. - Pulmonary arteries: PA peak pressure: 46 mm Hg (S). - Systemic veins: Dilated IVC with normal respiratory variation.   Estimated CVP 8 mmHg.  10/08/16: LE venous US IMPRESSION: No evidence of deep venous thrombosis.  10/07/15: stress myovoiew  Nuclear stress EF: 55%.  There was no ST segment deviation noted during stress.  There is a small defect of mild severity present in the basal inferior and basal inferolateral location. The defect is non-reversible and consistent with diaphragmatic attenuation and extra cardiac  activity. No ischemia noted.  This is a low risk study.  The left ventricular ejection fraction is normal (  55-65%).    Recent Labs: 11/15/2016: Hemoglobin 11.0; NT-Pro BNP 329; Platelets 243 10/09/2017: BUN 16; Creatinine, Ser 1.20; Potassium 5.1; Sodium 142  No results found for requested labs within last 8760 hours.   Estimated Creatinine Clearance: 61.6 mL/min (by C-G formula based on SCr of 1.2 mg/dL).   Wt Readings from Last 3 Encounters:  10/18/17 267 lb (121.1 kg)  10/02/17 277 lb 12 oz (126 kg)  08/20/17 273 lb (123.8 kg)     Other studies reviewed: Additional studies/records reviewed today include: summarized above  ASSESSMENT AND PLAN:  1. Longstanding persistent AFib     CHA2DS2Vasc is 3, though not felt to be a/c candidate 2/2 GIB     rate controlled  2. Hypertensive heart disease     HFpEF     Weight is down 10lbs, less but persistent LE edema     Looks like the patient did not get his K+ instructions from his last labs, BMET today.  Reduce Kdur to 83meq once daily, further diuretic/K+ instructions pending BMET, likely will repeat a week of BID lasix dosing.  Very sedentary life We had a lengthy discussion about sodium restriction, he reports home weight is at 260lbs.   Continue to monitor   3. HTN     Looks OK, no changes   Disposition: return in 3 months, sooner if needed.  Current medicines are reviewed at length with the patient today.  The patient did not have any concerns regarding medicines.  Venetia Night, PA-C 10/18/2017 1:54 PM     [place] [address] [place] [address] [phone] (office)  [phone] (fax)

## 2017-10-17 NOTE — Telephone Encounter (Signed)
LMOVM  TO CALL CLINIC BACK ABOUT RESULTS

## 2017-10-17 NOTE — Telephone Encounter (Signed)
-----   Message from [place], [place] sent at 10/10/2017  6:27 PM EST ----- Lab looks OK.  His potassium level is OK, higher end of normal, he likely does not need the additional 29meq potassium dose at night.  Continue with only the 39meq tab daily of potassium.  Thanks renee

## 2017-10-18 ENCOUNTER — Encounter: Payer: Self-pay | Admitting: Physician Assistant

## 2017-10-18 ENCOUNTER — Ambulatory Visit: Payer: PPO | Admitting: Physician Assistant

## 2017-10-18 VITALS — BP 128/68 | HR 78 | Ht 72.0 in | Wt 267.0 lb

## 2017-10-18 DIAGNOSIS — I10 Essential (primary) hypertension: Secondary | ICD-10-CM | POA: Diagnosis not present

## 2017-10-18 DIAGNOSIS — I5030 Unspecified diastolic (congestive) heart failure: Secondary | ICD-10-CM

## 2017-10-18 DIAGNOSIS — Z79899 Other long term (current) drug therapy: Secondary | ICD-10-CM

## 2017-10-18 DIAGNOSIS — I480 Paroxysmal atrial fibrillation: Secondary | ICD-10-CM

## 2017-10-18 MED ORDER — POTASSIUM CHLORIDE CRYS ER 20 MEQ PO TBCR: 20.0000 meq | EXTENDED_RELEASE_TABLET | Freq: Every day | ORAL | 6 refills | Status: DC

## 2017-10-18 NOTE — Patient Instructions (Addendum)
Medication Instructions:   START  TAKING POTASSIUM  20 MEQ  ONCE A DAY   STOP TAKING THE PM  DOSE   If you need a refill on your cardiac medications before your next appointment, please call your pharmacy.  Labwork: BMET TODAY    Testing/Procedures: NONE ORDERED  TODAY    Follow-Up: IN 3 MONTHS WTH URSUY   Any Other Special Instructions Will Be Listed Below (If Applicable).

## 2017-10-19 LAB — BASIC METABOLIC PANEL
BUN / CREAT RATIO: 16 (ref 10–24)
BUN: 21 mg/dL (ref 8–27)
CO2: 24 mmol/L (ref 20–29)
CREATININE: 1.35 mg/dL — AB (ref 0.76–1.27)
Calcium: 9.7 mg/dL (ref 8.6–10.2)
Chloride: 100 mmol/L (ref 96–106)
GFR calc non Af Amer: 48 mL/min/{1.73_m2} — ABNORMAL LOW (ref >59)
GFR, EST AFRICAN AMERICAN: 55 mL/min/{1.73_m2} — AB (ref >59)
GLUCOSE: 105 mg/dL — AB (ref 65–99)
Potassium: 4.7 mmol/L (ref 3.5–5.2)
Sodium: 140 mmol/L (ref 134–144)

## 2017-10-23 ENCOUNTER — Telehealth: Payer: Self-pay | Admitting: *Deleted

## 2017-10-23 DIAGNOSIS — Z79899 Other long term (current) drug therapy: Secondary | ICD-10-CM

## 2017-10-23 NOTE — Telephone Encounter (Signed)
-----   Message from [place], [place] sent at 10/19/2017  6:42 PM EST ----- Kidney function is a little lower then last labs,  will nee to repeat in a week after the week of extra lasix dosing follow kidney function.  Thanks renee

## 2017-10-23 NOTE — Telephone Encounter (Signed)
SPOKE TO WIFE ABOUT RESULTS AND VERBALIZED UNDERSTANDING.  PT WIFE AGREED TO BRING PT IN THURS A WEEK FROM LAST OV FOR REPEAT BMET

## 2017-10-25 ENCOUNTER — Other Ambulatory Visit: Payer: PPO | Admitting: *Deleted

## 2017-10-25 DIAGNOSIS — Z79899 Other long term (current) drug therapy: Secondary | ICD-10-CM

## 2017-10-26 LAB — BASIC METABOLIC PANEL WITH GFR
BUN/Creatinine Ratio: 15 (ref 10–24)
BUN: 18 mg/dL (ref 8–27)
CO2: 28 mmol/L (ref 20–29)
Calcium: 9.6 mg/dL (ref 8.6–10.2)
Chloride: 101 mmol/L (ref 96–106)
Creatinine, Ser: 1.17 mg/dL (ref 0.76–1.27)
GFR calc Af Amer: 66 mL/min/1.73
GFR calc non Af Amer: 57 mL/min/1.73 — ABNORMAL LOW
Glucose: 130 mg/dL — ABNORMAL HIGH (ref 65–99)
Potassium: 4.4 mmol/L (ref 3.5–5.2)
Sodium: 143 mmol/L (ref 134–144)

## 2017-10-29 DIAGNOSIS — R062 Wheezing: Secondary | ICD-10-CM | POA: Diagnosis not present

## 2017-11-15 DIAGNOSIS — I10 Essential (primary) hypertension: Secondary | ICD-10-CM | POA: Diagnosis not present

## 2017-11-15 DIAGNOSIS — M25562 Pain in left knee: Secondary | ICD-10-CM | POA: Diagnosis not present

## 2017-11-15 DIAGNOSIS — I482 Chronic atrial fibrillation: Secondary | ICD-10-CM | POA: Diagnosis not present

## 2017-11-19 DIAGNOSIS — M545 Low back pain: Secondary | ICD-10-CM | POA: Diagnosis not present

## 2017-11-19 DIAGNOSIS — I10 Essential (primary) hypertension: Secondary | ICD-10-CM | POA: Diagnosis not present

## 2017-11-19 DIAGNOSIS — M25562 Pain in left knee: Secondary | ICD-10-CM | POA: Diagnosis not present

## 2017-12-03 ENCOUNTER — Telehealth: Payer: Self-pay

## 2017-12-03 DIAGNOSIS — I10 Essential (primary) hypertension: Secondary | ICD-10-CM | POA: Diagnosis not present

## 2017-12-03 DIAGNOSIS — E669 Obesity, unspecified: Secondary | ICD-10-CM | POA: Diagnosis not present

## 2017-12-03 DIAGNOSIS — I482 Chronic atrial fibrillation: Secondary | ICD-10-CM | POA: Diagnosis not present

## 2017-12-03 DIAGNOSIS — M545 Low back pain: Secondary | ICD-10-CM | POA: Diagnosis not present

## 2017-12-03 MED ORDER — FUROSEMIDE 40 MG PO TABS: 60.0000 mg | ORAL_TABLET | Freq: Two times a day (BID) | ORAL | 3 refills | Status: DC

## 2017-12-03 NOTE — Telephone Encounter (Signed)
Pt arrived with wife for her f/u with Dr. [name].  Pt states to Dr. [name] that he has continued to be short of breath after recent appt with Tommye Standard. Weight today 277.   Cr 1.17 10/25/2017. Notified Pt to increase lasix to 60 mg 1.5 tablets by mouth twice day for 3 days. Pt needs f/u within next week to 2 weeks.  Will f/u with appt.

## 2017-12-04 ENCOUNTER — Other Ambulatory Visit (HOSPITAL_COMMUNITY): Payer: Self-pay | Admitting: Respiratory Therapy

## 2017-12-04 DIAGNOSIS — R0602 Shortness of breath: Secondary | ICD-10-CM

## 2017-12-10 ENCOUNTER — Encounter: Payer: Self-pay | Admitting: Cardiology

## 2017-12-10 ENCOUNTER — Encounter (INDEPENDENT_AMBULATORY_CARE_PROVIDER_SITE_OTHER): Payer: Self-pay

## 2017-12-10 ENCOUNTER — Ambulatory Visit (INDEPENDENT_AMBULATORY_CARE_PROVIDER_SITE_OTHER): Payer: PPO | Admitting: Cardiology

## 2017-12-10 VITALS — BP 120/70 | HR 70 | Ht 72.0 in | Wt 269.2 lb

## 2017-12-10 DIAGNOSIS — I481 Persistent atrial fibrillation: Secondary | ICD-10-CM | POA: Diagnosis not present

## 2017-12-10 DIAGNOSIS — I4819 Other persistent atrial fibrillation: Secondary | ICD-10-CM

## 2017-12-10 DIAGNOSIS — I10 Essential (primary) hypertension: Secondary | ICD-10-CM

## 2017-12-10 DIAGNOSIS — R0602 Shortness of breath: Secondary | ICD-10-CM

## 2017-12-10 DIAGNOSIS — I5030 Unspecified diastolic (congestive) heart failure: Secondary | ICD-10-CM

## 2017-12-10 MED ORDER — FUROSEMIDE 40 MG PO TABS: ORAL_TABLET | ORAL | 3 refills | Status: DC

## 2017-12-10 NOTE — Progress Notes (Signed)
Cardiology Office Note   Date:  12/10/2017   ID:  Samnang, Shugars 1930/10/18, MRN [id number]  PCP:  Sinda Du, MD  Cardiologist:  Dr. [name]    Chief Complaint  Patient presents with  . Leg Swelling      History of Present Illness: Bernard Ramirez is a 84 y.o. male who presents for edema and SOB. No chest pain.   He has a history of persistent AFib, not on a/c 2/2 GIB/anemia, CVA, OSA intolerant CPAP,  Prostate cancer, HTN, chronic CHF (diastolic).  Echo 09/2016 with EF 60-65%, mild AS, pk PA pressure of 46 mmHg.  Estimated CVP of 8.   In Sept visit  described to have some degree of chronic LEE, though felt to be fluid OL, and his diuretics increased for 4 days and planned to f/u in a few months.  08/20/17, He has significant and chronic back pain that has him with a very sedentary life style, does not get up and around much, though reported no DOE with his usual ADLs that he says he can do without difficulty inside the house, walks with the aid of a cane.  Denied any kind of CP or palpitations, no dizziness, near syncope or syncope.  At times with SOB and extra lasix ordered.    Recently pt had increased DOE and lasix increased to 60 mg BID for 3 days then follow up in 2 weeks.    Today the Lasix  60 mg BID did help with edema and SOB improved.  He has seen pulmonologist as well and PFTs are planned.  No chest pain.  He does add salt, not as much as he once did but still likes salt.  He has put potato chips in his ice cream.  We discussed decreasing salt at length.  His SOB is mostly with exertion.    Past Medical History:  Diagnosis Date  . Anemia    04/2012: H&H-10.2/31.5, MCV-77; 06/2012:12/38.9  . Arthritis   . Benign prostatic hypertrophy    s/p transurethral resection of the prostate  . Chest pain    2004; with dyspnea, stress nuclear-normal EF + questionable inferior wall ischemia, normal coronary angiography;  2010-negative stress nuclear  . CVA  (cerebral infarction)    asymptomatic (seen on CT)  . Diarrhea    h/o Hemoccult-positive stool  . Dysrhythmia   . Edema of both legs   . GERD (gastroesophageal reflux disease)   . History of blood transfusion   . History of kidney stones   . History of urinary tract infection   . Hypertension   . Jerking movements of extremities    left arm   . Low back pain   . Obstructive sleep apnea    pt states can not use the CPAP  . Peripheral neuropathy    lower legs and feet bilat   . Persistent atrial fibrillation ([place]) 2013   Asymptomatic; diagnosed in 05/2012  . Pneumonia   . Prostate cancer ([place]) 2017   stage 4  . Shortness of breath dyspnea    pt states only develops SOB if tries to do something too quickly  . Upper GI bleed 1985   1985-peptic ulcer disease; initial hemigastrectomy; subsequent Billroth II  . Urinary hesitancy   . Vertigo    ED evaluation-06/2012    Past Surgical History:  Procedure Laterality Date  . Billroth II    . CHOLECYSTECTOMY    . COLONOSCOPY  Approximately 2000  .  COLONOSCOPY  10/2012   Colonoscopy March 2014 at [facility], difficulty exam requiring fluoroscopy and overtube. Patient developed severe bradycardia again during his procedure like he did [facility]. Multiple polyps removed which were tubular adenomas. Previously tattooed sites at 20 and 30 cm were free of any polypoid change. Left-sided diverticulosis noted.  . COLONOSCOPY WITH ESOPHAGOGASTRODUODENOSCOPY (EGD)  06-17-2009   GEX:BMWUXLKG tubular adenomas and 2 tubulovillous adenomas, incomplete, 3 clips placed  . ESOPHAGOGASTRODUODENOSCOPY  06/17/2009   SLF: normal/chronic gastritis (non-h pylori)  . ESOPHAGOGASTRODUODENOSCOPY N/A 05/11/2015   MWN:UUVOZDG anemai due to postgastrectomy state/moderate erosive gastritis  . INGUINAL HERNIA REPAIR     Left  . ROTATOR CUFF REPAIR  1991   Bilateral  . TRANSURETHRAL RESECTION OF PROSTATE  06/2016  . TRANSURETHRAL RESECTION OF PROSTATE N/A  12/27/2012   Procedure: TRANSURETHRAL RESECTION OF THE PROSTATE (TURP);  Surgeon: Marissa Nestle, MD;  Location: [facility];  Service: Urology;  Laterality: N/A;  . TRANSURETHRAL RESECTION OF PROSTATE N/A 04/11/2016   Procedure: TRANSURETHRAL RESECTION OF THE PROSTATE (TURP);  Surgeon: Irine Seal, MD;  Location: [facility];  Service: Urology;  Laterality: N/A;  . VAGOTOMY AND PYLOROPLASTY  1970s   Details of procedure uncertain; 6440H     Current Outpatient Medications  Medication Sig Dispense Refill  . clobetasol cream (TEMOVATE) 4.74 % Apply 1 application topically daily as needed (use as directed for skin).     Marland Kitchen clopidogrel (PLAVIX) 75 MG tablet Take 75 mg by mouth daily.  0  . diazepam (VALIUM) 5 MG tablet Take 1 tablet by mouth twice daily as needed for anxiety  2  . furosemide (LASIX) 40 MG tablet Take 1.5 tablets (60 mg total) by mouth 2 (two) times daily. 270 tablet 3  . HYDROcodone-acetaminophen (NORCO) 10-325 MG tablet TAKE 1 TABLET  FIVE TIMES DAILY AS NEEDED FOR PAIN  0  . hydrOXYzine (ATARAX/VISTARIL) 50 MG tablet Take 1 tablet (50 mg total) by mouth every 6 (six) hours as needed for itching. 30 tablet 0  . losartan (COZAAR) 100 MG tablet Take 1 tablet by mouth daily  2  . meclizine (ANTIVERT) 25 MG tablet Take 25 mg by mouth at bedtime.     Marland Kitchen nystatin (MYCOSTATIN/NYSTOP) 100000 UNIT/GM POWD Apply 1 g topically daily as needed (rash).     . pantoprazole (PROTONIX) 40 MG tablet Take 40 mg by mouth daily.    . potassium chloride SA (K-DUR,KLOR-CON) 20 MEQ tablet Take 1 tablet (20 mEq total) by mouth daily. 45 tablet 6  . triamcinolone cream (KENALOG) 0.1 % Apply 1 application topically daily as needed (for irritation).      No current facility-administered medications for this visit.     Allergies:   Contrast media [iodinated diagnostic agents]; Gabapentin; Aspirin; Latex; Lipitor [atorvastatin]; and Tape    Social History:  The patient  reports that he quit smoking about 14 years  ago. His smoking use included cigars. He quit after 12.00 years of use. He has never used smokeless tobacco. He reports that he does not drink alcohol or use drugs.   Family History:  The patient's family history includes Allergic rhinitis in his father and mother; Diabetes Mellitus II in his mother; Lung disease in his father.    ROS:  General:no colds or fevers,  weight decreased but now heading back up. Skin:no rashes or ulcers HEENT:no blurred vision, no congestion CV:see HPI PUL:see HPI GI:no diarrhea constipation or melena, no indigestion GU:no hematuria, no dysuria MS:no joint pain,  no claudication Neuro:no syncope, no lightheadedness Endo:no diabetes, no thyroid disease  Wt Readings from Last 3 Encounters:  12/10/17 269 lb 3.2 oz (122.1 kg)  10/18/17 267 lb (121.1 kg)  10/02/17 277 lb 12 oz (126 kg)     PHYSICAL EXAM: VS:  BP 120/70   Pulse 70   Ht 6' (1.829 m)   Wt 269 lb 3.2 oz (122.1 kg)   SpO2 97%   BMI 36.51 kg/m  , BMI Body mass index is 36.51 kg/m. General:Pleasant affect, NAD Skin:Warm and dry, brisk capillary refill HEENT:normocephalic, sclera clear, mucus membranes moist Neck:supple, no JVD, no bruits  Heart:S1S2 RRR without murmur, gallup, rub or click Lungs:clear without rales, rhonchi, or wheezes YWV:PXTG, non tender, + BS, do not palpate liver spleen or masses Ext:no lower ext edema, 2+ pedal pulses, 2+ radial pulses Neuro:alert and oriented X 3, [name], follows commands, + facial symmetry    EKG:  EKG is ordered today. The ekg ordered today demonstrates a fib rate of 70 and no ST changes.  Rightward axis.     Recent Labs: 10/25/2017: BUN 18; Creatinine, Ser 1.17; Potassium 4.4; Sodium 143    Lipid Panel    Component Value Date/Time   CHOL  05/18/2009 0525    179        ATP III CLASSIFICATION:  <200     mg/dL   Desirable  200-239  mg/dL   Borderline High  >=240    mg/dL   High          TRIG 189 (H) 05/18/2009 0525   HDL 34 (L) 05/18/2009  0525   CHOLHDL 5.3 05/18/2009 0525   VLDL 38 05/18/2009 0525   LDLCALC (H) 05/18/2009 0525    107        Total Cholesterol/HDL:CHD Risk Coronary Heart Disease Risk Table                     Men   Women  1/2 Average Risk   3.4   3.3  Average Risk       5.0   4.4  2 X Average Risk   9.6   7.1  3 X Average Risk  23.4   11.0        Use the calculated Patient Ratio above and the CHD Risk Table to determine the patient's CHD Risk.        ATP III CLASSIFICATION (LDL):  <100     mg/dL   Optimal  100-129  mg/dL   Near or Above                    Optimal  130-159  mg/dL   Borderline  160-189  mg/dL   High  >190     mg/dL   Very High       Other studies Reviewed: Additional studies/ records that were reviewed today include: . Echo 10/08/16  Study Conclusions  - Left ventricle: The cavity size was normal. Wall thickness was   increased in a pattern of mild LVH. Systolic function was normal.   The estimated ejection fraction was in the range of 60% to 65%.   Images were inadequate for LV wall motion assessment. The study   was not technically sufficient to allow evaluation of LV   diastolic dysfunction due to atrial fibrillation. - Aortic valve: Moderately calcified annulus. Moderately thickened,   moderately calcified leaflets. Morphologically, there appears to   be at least mild aortic stenosis. -  Mitral valve: Calcified annulus. Mildly thickened leaflets . - Left atrium: The atrium was moderately dilated. - Right ventricle: The cavity size was moderately dilated. Systolic   function was mildly to moderately reduced. - Right atrium: The atrium was moderately dilated. - Tricuspid valve: There was mild regurgitation. - Pulmonary arteries: PA peak pressure: 46 mm Hg (S). - Systemic veins: Dilated IVC with normal respiratory variation.   Estimated CVP 8 mmHg.  Nuc study 10/07/15 Study Highlights    Nuclear stress EF: 55%.  There was no ST segment deviation noted during  stress.  There is a small defect of mild severity present in the basal inferior and basal inferolateral location. The defect is non-reversible and consistent with diaphragmatic attenuation and extra cardiac activity. No ischemia noted.  This is a low risk study.  The left ventricular ejection fraction is normal (55-65%).       ASSESSMENT AND PLAN:  1.  Lower ext edema and DOE.  Will check BMP and BNP today for SOB and increase Lasix to 60 in AM and 40 mg in pm.  Decrease salt and follow up in May with Renaye, PA  Will also have him repeat Echo and will call results.  He will follow up with Dr. Luan Pulling as well.  2.  Persistent atrial fib rate controlled. No anticoagulation due GI bleed.  3.  HTN controlled.  Continue losartan       Current medicines are reviewed with the patient today.  The patient Has no concerns regarding medicines.  The following changes have been made:  See above Labs/ tests ordered today include:see above  Disposition:   FU:  see above  Signed, Cecilie Kicks, NP  12/10/2017 3:59 PM    [facility] [employer], [address] [place], [place] Phone: [phone]; Fax: [phone]

## 2017-12-10 NOTE — Patient Instructions (Addendum)
Medication Instructions:  Your physician has recommended you make the following change in your medication:  1.  INCREASE the Lasix to 40 mg taking  1 1/2 tablet in the a.m and 1 tablet in the afternoon  Labwork: TODAY:  BMET & PRO BNP  Testing/Procedures: Your physician has requested that you have an echocardiogram. Echocardiography is a painless test that uses sound waves to create images of your heart. It provides your doctor with information about the size and shape of your heart and how well your heart's chambers and valves are working. This procedure takes approximately one hour. There are no restrictions for this procedure.    Follow-Up: Your physician recommends that you schedule a follow-up appointment in: [place]    Any Other Special Instructions Will Be Listed Below (If Applicable). Echocardiogram An echocardiogram, or echocardiography, uses sound waves (ultrasound) to produce an image of your heart. The echocardiogram is simple, painless, obtained within a short period of time, and offers valuable information to your health care provider. The images from an echocardiogram can provide information such as:  Evidence of coronary artery disease (CAD).  Heart size.  Heart muscle function.  Heart valve function.  Aneurysm detection.  Evidence of a past heart attack.  Fluid buildup around the heart.  Heart muscle thickening.  Assess heart valve function.  Tell a health care provider about:  Any allergies you have.  All medicines you are taking, including vitamins, herbs, eye drops, creams, and over-the-counter medicines.  Any problems you or family members have had with anesthetic medicines.  Any blood disorders you have.  Any surgeries you have had.  Any medical conditions you have.  Whether you are pregnant or may be pregnant. What happens before the procedure? No special preparation is needed. Eat and drink normally. What happens  during the procedure?  In order to produce an image of your heart, gel will be applied to your chest and a wand-like tool (transducer) will be moved over your chest. The gel will help transmit the sound waves from the transducer. The sound waves will harmlessly bounce off your heart to allow the heart images to be captured in real-time motion. These images will then be recorded.  You may need an IV to receive a medicine that improves the quality of the pictures. What happens after the procedure? You may return to your normal schedule including diet, activities, and medicines, unless your health care provider tells you otherwise. This information is not intended to replace advice given to you by your health care provider. Make sure you discuss any questions you have with your health care provider. Document Released: 08/04/2000 Document Revised: 03/25/2016 Document Reviewed: 04/14/2013 Elsevier Interactive Patient Education  2017 [employer].     If you need a refill on your cardiac medications before your next appointment, please call your pharmacy.

## 2017-12-11 LAB — BASIC METABOLIC PANEL
BUN / CREAT RATIO: 15 (ref 10–24)
BUN: 16 mg/dL (ref 8–27)
CO2: 25 mmol/L (ref 20–29)
CREATININE: 1.1 mg/dL (ref 0.76–1.27)
Calcium: 9.3 mg/dL (ref 8.6–10.2)
Chloride: 98 mmol/L (ref 96–106)
GFR calc Af Amer: 71 mL/min/{1.73_m2} (ref >59)
GFR calc non Af Amer: 61 mL/min/{1.73_m2} (ref >59)
GLUCOSE: 132 mg/dL — AB (ref 65–99)
Potassium: 4.8 mmol/L (ref 3.5–5.2)
SODIUM: 139 mmol/L (ref 134–144)

## 2017-12-11 LAB — PRO B NATRIURETIC PEPTIDE: NT-PRO BNP: 300 pg/mL (ref 0–486)

## 2017-12-13 ENCOUNTER — Encounter (HOSPITAL_COMMUNITY): Payer: PPO

## 2017-12-13 ENCOUNTER — Ambulatory Visit (HOSPITAL_COMMUNITY)
Admission: RE | Admit: 2017-12-13 | Discharge: 2017-12-13 | Disposition: A | Discharge status: Home / Self Care | Payer: PPO | Source: Ambulatory Visit | Attending: Pulmonary Disease | Admitting: Pulmonary Disease

## 2017-12-13 DIAGNOSIS — R0602 Shortness of breath: Secondary | ICD-10-CM | POA: Diagnosis not present

## 2017-12-13 LAB — PULMONARY FUNCTION TEST
DL/VA % pred: 74 %
DL/VA: 3.45 ml/min/mmHg/L
DLCO UNC: 15.2 ml/min/mmHg
DLCO unc % pred: 45 %
FEF 25-75 POST: 2.51 L/s
FEF 25-75 Pre: 2.21 L/sec
FEF2575-%Change-Post: 13 %
FEF2575-%PRED-PRE: 121 %
FEF2575-%Pred-Post: 138 %
FEV1-%Change-Post: 0 %
FEV1-%PRED-POST: 74 %
FEV1-%PRED-PRE: 74 %
FEV1-PRE: 2.07 L
FEV1-Post: 2.08 L
FEV1FVC-%Change-Post: 1 %
FEV1FVC-%PRED-PRE: 116 %
FEV6-%Change-Post: -4 %
FEV6-%PRED-POST: 64 %
FEV6-%PRED-PRE: 67 %
FEV6-Post: 2.39 L
FEV6-Pre: 2.51 L
FEV6FVC-%CHANGE-POST: 0 %
FEV6FVC-%PRED-POST: 107 %
FEV6FVC-%Pred-Pre: 107 %
FVC-%Change-Post: -1 %
FVC-%PRED-PRE: 62 %
FVC-%Pred-Post: 62 %
FVC-POST: 2.48 L
FVC-Pre: 2.51 L
POST FEV6/FVC RATIO: 100 %
PRE FEV1/FVC RATIO: 83 %
Post FEV1/FVC ratio: 84 %
Pre FEV6/FVC Ratio: 100 %
RV % pred: 103 %
RV: 2.91 L
TLC % pred: 74 %
TLC: 5.45 L

## 2017-12-13 MED ORDER — ALBUTEROL SULFATE (2.5 MG/3ML) 0.083% IN NEBU
2.5000 mg | INHALATION_SOLUTION | Freq: Once | RESPIRATORY_TRACT | Status: AC
  Administered 2017-12-13: 2.5 mg via RESPIRATORY_TRACT

## 2017-12-14 ENCOUNTER — Telehealth: Payer: Self-pay

## 2017-12-14 DIAGNOSIS — I482 Chronic atrial fibrillation: Secondary | ICD-10-CM | POA: Diagnosis not present

## 2017-12-14 DIAGNOSIS — R0602 Shortness of breath: Secondary | ICD-10-CM | POA: Diagnosis not present

## 2017-12-14 DIAGNOSIS — L299 Pruritus, unspecified: Secondary | ICD-10-CM | POA: Diagnosis not present

## 2017-12-14 DIAGNOSIS — Z79899 Other long term (current) drug therapy: Secondary | ICD-10-CM

## 2017-12-14 DIAGNOSIS — I10 Essential (primary) hypertension: Secondary | ICD-10-CM | POA: Diagnosis not present

## 2017-12-14 NOTE — Telephone Encounter (Signed)
-----   Message from Isaiah Serge, NP sent at 12/11/2017  7:56 AM EDT ----- Labs are good  Continue meds as planned repeat BMP in 2 weeks.

## 2017-12-17 ENCOUNTER — Ambulatory Visit (HOSPITAL_COMMUNITY): Payer: PPO | Attending: Cardiology

## 2017-12-17 ENCOUNTER — Other Ambulatory Visit: Payer: Self-pay

## 2017-12-17 DIAGNOSIS — I4891 Unspecified atrial fibrillation: Secondary | ICD-10-CM | POA: Insufficient documentation

## 2017-12-17 DIAGNOSIS — G8929 Other chronic pain: Secondary | ICD-10-CM | POA: Diagnosis not present

## 2017-12-17 DIAGNOSIS — Z8673 Personal history of transient ischemic attack (TIA), and cerebral infarction without residual deficits: Secondary | ICD-10-CM | POA: Diagnosis not present

## 2017-12-17 DIAGNOSIS — M549 Dorsalgia, unspecified: Secondary | ICD-10-CM | POA: Diagnosis not present

## 2017-12-17 DIAGNOSIS — I340 Nonrheumatic mitral (valve) insufficiency: Secondary | ICD-10-CM | POA: Diagnosis not present

## 2017-12-17 DIAGNOSIS — R0602 Shortness of breath: Secondary | ICD-10-CM | POA: Insufficient documentation

## 2017-12-17 DIAGNOSIS — I110 Hypertensive heart disease with heart failure: Secondary | ICD-10-CM | POA: Diagnosis not present

## 2017-12-17 DIAGNOSIS — I509 Heart failure, unspecified: Secondary | ICD-10-CM | POA: Diagnosis not present

## 2017-12-17 MED ORDER — PERFLUTREN LIPID MICROSPHERE
1.0000 mL | INTRAVENOUS | Status: AC | PRN
  Administered 2017-12-17: 3 mL via INTRAVENOUS

## 2017-12-20 ENCOUNTER — Telehealth: Payer: Self-pay | Admitting: Cardiology

## 2017-12-20 NOTE — Telephone Encounter (Signed)
New message   Patient's  spouse is calling, states patient was given an IV on 5/1 prior to echo. The patient now has a rash on arms. Requesting nurse call

## 2017-12-20 NOTE — Telephone Encounter (Signed)
I spoke with the patient's wife who is concerned about husband's itching and rash on bilateral arms and back area.  She believes it was a chemical  reaction from the Echo study.    Lorriane Shire Soil scientist) called the patient's wife and explained the study protocol and the observation that the patient is under during the study so as to notice a reaction.    The wife had mixed time frames of the itching/rash development. After a somewhat lengthy explanation, the wife became frustrated and said that they will contact the PCP to further discuss and hung up the telephone.

## 2017-12-21 NOTE — Telephone Encounter (Signed)
Yes she should contact PCP, from what echo tech noted he had no issues with the test.  He should see PCP.

## 2017-12-24 ENCOUNTER — Other Ambulatory Visit: Payer: PPO | Admitting: *Deleted

## 2017-12-24 DIAGNOSIS — Z79899 Other long term (current) drug therapy: Secondary | ICD-10-CM

## 2017-12-25 LAB — BASIC METABOLIC PANEL
BUN / CREAT RATIO: 16 (ref 10–24)
BUN: 18 mg/dL (ref 8–27)
CO2: 28 mmol/L (ref 20–29)
CREATININE: 1.16 mg/dL (ref 0.76–1.27)
Calcium: 9.4 mg/dL (ref 8.6–10.2)
Chloride: 97 mmol/L (ref 96–106)
GFR calc Af Amer: 66 mL/min/{1.73_m2} (ref >59)
GFR, EST NON AFRICAN AMERICAN: 58 mL/min/{1.73_m2} — AB (ref >59)
GLUCOSE: 130 mg/dL — AB (ref 65–99)
Potassium: 4.4 mmol/L (ref 3.5–5.2)
SODIUM: 140 mmol/L (ref 134–144)

## 2017-12-31 ENCOUNTER — Ambulatory Visit: Payer: PPO | Admitting: Internal Medicine

## 2018-01-11 ENCOUNTER — Ambulatory Visit: Payer: PPO | Admitting: Physician Assistant

## 2018-03-04 DIAGNOSIS — M545 Low back pain: Secondary | ICD-10-CM | POA: Diagnosis not present

## 2018-03-04 DIAGNOSIS — I10 Essential (primary) hypertension: Secondary | ICD-10-CM | POA: Diagnosis not present

## 2018-03-04 DIAGNOSIS — I872 Venous insufficiency (chronic) (peripheral): Secondary | ICD-10-CM | POA: Diagnosis not present

## 2018-03-04 DIAGNOSIS — Z79891 Long term (current) use of opiate analgesic: Secondary | ICD-10-CM | POA: Diagnosis not present

## 2018-03-04 DIAGNOSIS — I482 Chronic atrial fibrillation: Secondary | ICD-10-CM | POA: Diagnosis not present

## 2018-06-24 DIAGNOSIS — L570 Actinic keratosis: Secondary | ICD-10-CM | POA: Diagnosis not present

## 2018-06-24 DIAGNOSIS — L259 Unspecified contact dermatitis, unspecified cause: Secondary | ICD-10-CM | POA: Diagnosis not present

## 2018-06-24 DIAGNOSIS — D485 Neoplasm of uncertain behavior of skin: Secondary | ICD-10-CM | POA: Diagnosis not present

## 2018-06-24 DIAGNOSIS — C44329 Squamous cell carcinoma of skin of other parts of face: Secondary | ICD-10-CM | POA: Diagnosis not present

## 2018-06-24 DIAGNOSIS — Z85828 Personal history of other malignant neoplasm of skin: Secondary | ICD-10-CM | POA: Diagnosis not present

## 2018-07-23 DIAGNOSIS — Z0000 Encounter for general adult medical examination without abnormal findings: Secondary | ICD-10-CM | POA: Diagnosis not present

## 2018-07-25 DIAGNOSIS — R609 Edema, unspecified: Secondary | ICD-10-CM | POA: Diagnosis not present

## 2018-07-25 DIAGNOSIS — I10 Essential (primary) hypertension: Secondary | ICD-10-CM | POA: Diagnosis not present

## 2018-07-25 DIAGNOSIS — L988 Other specified disorders of the skin and subcutaneous tissue: Secondary | ICD-10-CM | POA: Diagnosis not present

## 2018-07-25 DIAGNOSIS — I482 Chronic atrial fibrillation, unspecified: Secondary | ICD-10-CM | POA: Diagnosis not present

## 2018-07-25 DIAGNOSIS — D649 Anemia, unspecified: Secondary | ICD-10-CM | POA: Diagnosis not present

## 2018-07-25 DIAGNOSIS — C44329 Squamous cell carcinoma of skin of other parts of face: Secondary | ICD-10-CM | POA: Diagnosis not present

## 2018-09-11 DIAGNOSIS — I10 Essential (primary) hypertension: Secondary | ICD-10-CM | POA: Diagnosis not present

## 2018-09-11 DIAGNOSIS — M545 Low back pain: Secondary | ICD-10-CM | POA: Diagnosis not present

## 2018-09-11 DIAGNOSIS — I482 Chronic atrial fibrillation, unspecified: Secondary | ICD-10-CM | POA: Diagnosis not present

## 2018-09-11 DIAGNOSIS — M25562 Pain in left knee: Secondary | ICD-10-CM | POA: Diagnosis not present

## 2018-10-22 DIAGNOSIS — M545 Low back pain: Secondary | ICD-10-CM | POA: Diagnosis not present

## 2018-10-22 DIAGNOSIS — I482 Chronic atrial fibrillation, unspecified: Secondary | ICD-10-CM | POA: Diagnosis not present

## 2018-10-22 DIAGNOSIS — I10 Essential (primary) hypertension: Secondary | ICD-10-CM | POA: Diagnosis not present

## 2018-10-22 DIAGNOSIS — N401 Enlarged prostate with lower urinary tract symptoms: Secondary | ICD-10-CM | POA: Diagnosis not present

## 2018-12-02 ENCOUNTER — Other Ambulatory Visit: Payer: Self-pay | Admitting: Cardiology

## 2019-01-22 DIAGNOSIS — M545 Low back pain: Secondary | ICD-10-CM | POA: Diagnosis not present

## 2019-01-22 DIAGNOSIS — M25562 Pain in left knee: Secondary | ICD-10-CM | POA: Diagnosis not present

## 2019-01-22 DIAGNOSIS — N401 Enlarged prostate with lower urinary tract symptoms: Secondary | ICD-10-CM | POA: Diagnosis not present

## 2019-01-22 DIAGNOSIS — I482 Chronic atrial fibrillation, unspecified: Secondary | ICD-10-CM | POA: Diagnosis not present

## 2019-01-22 DIAGNOSIS — I10 Essential (primary) hypertension: Secondary | ICD-10-CM | POA: Diagnosis not present

## 2019-01-30 DIAGNOSIS — M1712 Unilateral primary osteoarthritis, left knee: Secondary | ICD-10-CM | POA: Diagnosis not present

## 2019-02-25 ENCOUNTER — Other Ambulatory Visit: Payer: Self-pay | Admitting: Cardiology

## 2019-03-11 DIAGNOSIS — M25562 Pain in left knee: Secondary | ICD-10-CM | POA: Diagnosis not present

## 2019-03-11 DIAGNOSIS — I10 Essential (primary) hypertension: Secondary | ICD-10-CM | POA: Diagnosis not present

## 2019-03-20 ENCOUNTER — Other Ambulatory Visit: Payer: Self-pay

## 2019-05-13 DIAGNOSIS — Z23 Encounter for immunization: Secondary | ICD-10-CM | POA: Diagnosis not present

## 2019-05-13 DIAGNOSIS — M545 Low back pain: Secondary | ICD-10-CM | POA: Diagnosis not present

## 2019-05-13 DIAGNOSIS — M25561 Pain in right knee: Secondary | ICD-10-CM | POA: Diagnosis not present

## 2019-05-13 DIAGNOSIS — N401 Enlarged prostate with lower urinary tract symptoms: Secondary | ICD-10-CM | POA: Diagnosis not present

## 2019-05-13 DIAGNOSIS — I10 Essential (primary) hypertension: Secondary | ICD-10-CM | POA: Diagnosis not present

## 2019-05-19 ENCOUNTER — Ambulatory Visit (INDEPENDENT_AMBULATORY_CARE_PROVIDER_SITE_OTHER): Payer: PPO | Admitting: Cardiology

## 2019-05-19 ENCOUNTER — Other Ambulatory Visit: Payer: Self-pay

## 2019-05-19 ENCOUNTER — Encounter: Payer: Self-pay | Admitting: Cardiology

## 2019-05-19 VITALS — BP 114/60 | HR 67 | Ht 73.0 in | Wt 276.8 lb

## 2019-05-19 DIAGNOSIS — I5032 Chronic diastolic (congestive) heart failure: Secondary | ICD-10-CM | POA: Diagnosis not present

## 2019-05-19 DIAGNOSIS — R0789 Other chest pain: Secondary | ICD-10-CM

## 2019-05-19 DIAGNOSIS — I10 Essential (primary) hypertension: Secondary | ICD-10-CM | POA: Diagnosis not present

## 2019-05-19 DIAGNOSIS — R0602 Shortness of breath: Secondary | ICD-10-CM | POA: Diagnosis not present

## 2019-05-19 DIAGNOSIS — Z79899 Other long term (current) drug therapy: Secondary | ICD-10-CM

## 2019-05-19 DIAGNOSIS — I4821 Permanent atrial fibrillation: Secondary | ICD-10-CM

## 2019-05-19 DIAGNOSIS — R079 Chest pain, unspecified: Secondary | ICD-10-CM

## 2019-05-19 MED ORDER — FUROSEMIDE 40 MG PO TABS
ORAL_TABLET | ORAL | 1 refills | Status: DC
Start: 2019-05-19 — End: 2019-11-26

## 2019-05-19 MED ORDER — METOLAZONE 2.5 MG PO TABS: 2.5000 mg | ORAL_TABLET | ORAL | 0 refills | Status: DC

## 2019-05-19 NOTE — Patient Instructions (Addendum)
Medication Instructions:   Your physician has recommended you make the following change in your medication:   1) Increase your Lasix to 2 tablets(80MG ) by mouth in the morning for 4 days, then go back to 1.5 tablets in the morning and 1 tablet in the evening. 2) Start Metolazone 2.5MG , 1 tablet by mouth every other day before you take your Lasix for 4 days then stop. 3) Decrease your Cozaar to 1/2 tablet by mouth once a day for 1 week, then go back to your regular dose.  If you need a refill on your cardiac medications before your next appointment, please call your pharmacy.   Lab work:  You will have labs drawn today: BMET, BNP, and CBC  If you have labs (blood work) drawn today and your tests are completely normal, you will receive your results only by: Marland Kitchen MyChart Message (if you have MyChart) OR . A paper copy in the mail If you have any lab test that is abnormal or we need to change your treatment, we will call you to review the results.  Testing/Procedures:  A chest x-ray takes a picture of the organs and structures inside the chest, including the heart, lungs, and blood vessels. This test can show several things, including, whether the heart is enlarges; whether fluid is building up in the lungs; and whether pacemaker / defibrillator leads are still in place.   Follow-Up:  With Dr. Thompson Grayer, MD our scheduler will call you to set up an appointment.

## 2019-05-19 NOTE — Progress Notes (Signed)
Cardiology Office Note   Date:  05/21/2019   ID:  Shaunak, Kreis 03-Jan-1931, MRN [id number]  PCP:  Sinda Du, MD  Cardiologist:  Dr. [name]     Chief Complaint  Patient presents with  . Shortness of Breath      History of Present Illness: Bernard Ramirez is a 85 y.o. male who presents for DOE and edema, also + chest pain.  He has a history of persistent AFib, not on a/c 2/2 GIB/anemia, CVA, OSA intolerant CPAP, Prostate cancer, HTN, chronic CHF (diastolic).  Echo 09/2016 with EF 60-65%, mild AS, pk PA pressure of 46 mmHg.  Estimated CVP of 8.   In Sept visit  described to have some degree of chronic LEE, though felt to be fluid OL, and his diuretics increased for 4 days and planned to f/u in a few months.  08/20/17,He has significant and chronic back pain that has him with a very sedentary life style, does not get up and around much, though reported no DOE with his usual ADLs that he says he can do without difficulty inside the house, walks with the aid of a cane. Denied any kind of CP or palpitations, no dizziness, near syncope or syncope.  At times with SOB and extra lasix ordered.    Recently pt had increased DOE and lasix increased to 60 mg BID for 3 days then follow up in 2 weeks.    Last visit the Lasix  60 mg BID did help with edema and SOB improved.  He has seen pulmonologist as well and PFTs are planned.  No chest pain.  He does add salt, not as much as he once did but still likes salt.  He has put potato chips in his ice cream.  We discussed decreasing salt at length.  His SOB is mostly with exertion.   echo last year with EF 50-55% mild MR, LA mildly disalted.  RV systolic functions was moderatley reduced.  Pk PA pressure 65 mmHg    Today here due to increased DOE, edema and chest pain.  More freq episodes and his wt is up 9 lbs.  Last nuc was neg for ischemia.in 2017.  He does use salt in his diet.  No lightheadedness no syncope.  He is followed by  Dr. Luan Pulling for pulmonary issues.   Past Medical History:  Diagnosis Date  . Anemia    04/2012: H&H-10.2/31.5, MCV-77; 06/2012:12/38.9  . Arthritis   . Benign prostatic hypertrophy    s/p transurethral resection of the prostate  . Chest pain    2004; with dyspnea, stress nuclear-normal EF + questionable inferior wall ischemia, normal coronary angiography;  2010-negative stress nuclear  . CVA (cerebral infarction)    asymptomatic (seen on CT)  . Diarrhea    h/o Hemoccult-positive stool  . Dysrhythmia   . Edema of both legs   . GERD (gastroesophageal reflux disease)   . History of blood transfusion   . History of kidney stones   . History of urinary tract infection   . Hypertension   . Jerking movements of extremities    left arm   . Low back pain   . Obstructive sleep apnea    pt states can not use the CPAP  . Peripheral neuropathy    lower legs and feet bilat   . Persistent atrial fibrillation 2013   Asymptomatic; diagnosed in 05/2012  . Pneumonia   . Prostate cancer ([place]) 2017   stage 4  .  Shortness of breath dyspnea    pt states only develops SOB if tries to do something too quickly  . Upper GI bleed 1985   1985-peptic ulcer disease; initial hemigastrectomy; subsequent Billroth II  . Urinary hesitancy   . Vertigo    ED evaluation-06/2012    Past Surgical History:  Procedure Laterality Date  . Billroth II    . CHOLECYSTECTOMY    . COLONOSCOPY  Approximately 2000  . COLONOSCOPY  10/2012   Colonoscopy March 2014 at [facility], difficulty exam requiring fluoroscopy and overtube. Patient developed severe bradycardia again during his procedure like he did [facility]. Multiple polyps removed which were tubular adenomas. Previously tattooed sites at 20 and 30 cm were free of any polypoid change. Left-sided diverticulosis noted.  . COLONOSCOPY WITH ESOPHAGOGASTRODUODENOSCOPY (EGD)  06-17-2009   GLO:VFIEPPIR tubular adenomas and 2 tubulovillous adenomas, incomplete, 3 clips  placed  . ESOPHAGOGASTRODUODENOSCOPY  06/17/2009   SLF: normal/chronic gastritis (non-h pylori)  . ESOPHAGOGASTRODUODENOSCOPY N/A 05/11/2015   JJO:ACZYSAY anemai due to postgastrectomy state/moderate erosive gastritis  . INGUINAL HERNIA REPAIR     Left  . ROTATOR CUFF REPAIR  1991   Bilateral  . TRANSURETHRAL RESECTION OF PROSTATE  06/2016  . TRANSURETHRAL RESECTION OF PROSTATE N/A 12/27/2012   Procedure: TRANSURETHRAL RESECTION OF THE PROSTATE (TURP);  Surgeon: Marissa Nestle, MD;  Location: [facility];  Service: Urology;  Laterality: N/A;  . TRANSURETHRAL RESECTION OF PROSTATE N/A 04/11/2016   Procedure: TRANSURETHRAL RESECTION OF THE PROSTATE (TURP);  Surgeon: Irine Seal, MD;  Location: [facility];  Service: Urology;  Laterality: N/A;  . VAGOTOMY AND PYLOROPLASTY  1970s   Details of procedure uncertain; 3016W     Current Outpatient Medications  Medication Sig Dispense Refill  . clobetasol cream (TEMOVATE) 1.09 % Apply 1 application topically daily as needed (use as directed for skin).     Marland Kitchen clopidogrel (PLAVIX) 75 MG tablet Take 75 mg by mouth daily.  0  . furosemide (LASIX) 40 MG tablet TAKE 1.5 TABLET BY MOUTH EVERY MORNING THEN 1 TABLET BY MOUTH EVERY AFTERNOON 225 tablet 1  . HYDROcodone-acetaminophen (NORCO) 10-325 MG tablet TAKE 1 TABLET  FIVE TIMES DAILY AS NEEDED FOR PAIN  0  . hydrOXYzine (ATARAX/VISTARIL) 50 MG tablet Take 1 tablet (50 mg total) by mouth every 6 (six) hours as needed for itching. 30 tablet 0  . losartan (COZAAR) 100 MG tablet Take 1 tablet by mouth daily  2  . meclizine (ANTIVERT) 25 MG tablet Take 25 mg by mouth at bedtime.     Marland Kitchen nystatin (MYCOSTATIN/NYSTOP) 100000 UNIT/GM POWD Apply 1 g topically daily as needed (rash).     . pantoprazole (PROTONIX) 40 MG tablet Take 40 mg by mouth daily.    . potassium chloride SA (K-DUR,KLOR-CON) 20 MEQ tablet Take 1 tablet (20 mEq total) by mouth daily. 45 tablet 6  . triamcinolone cream (KENALOG) 0.1 % Apply 1 application  topically daily as needed (for irritation).     . metolazone (ZAROXOLYN) 2.5 MG tablet Take 1 tablet (2.5 mg total) by mouth every other day. 2 tablet 0   No current facility-administered medications for this visit.     Allergies:   Contrast media [iodinated diagnostic agents], Gabapentin, Aspirin, Latex, Lipitor [atorvastatin], and Tape    Social History:  The patient  reports that he quit smoking about 15 years ago. His smoking use included cigars. He quit after 12.00 years of use. He has never used smokeless tobacco. He reports that  he does not drink alcohol or use drugs.   Family History:  The patient's family history includes Allergic rhinitis in his father and mother; Diabetes Mellitus II in his mother; Lung disease in his father.    ROS:  General:no colds or fevers, no weight changes Skin:no rashes or ulcers HEENT:no blurred vision, no congestion CV:see HPI PUL:see HPI GI:no diarrhea constipation or melena, no indigestion GU:no hematuria, no dysuria MS:no joint pain, no claudication Neuro:no syncope, no lightheadedness Endo:no diabetes, no thyroid disease  Wt Readings from Last 3 Encounters:  05/19/19 276 lb 12.8 oz (125.6 kg)  12/10/17 269 lb 3.2 oz (122.1 kg)  10/18/17 267 lb (121.1 kg)     PHYSICAL EXAM: VS:  BP 114/60   Pulse 67   Ht 6\' 1"  (1.854 m)   Wt 276 lb 12.8 oz (125.6 kg)   SpO2 94%   BMI 36.52 kg/m  , BMI Body mass index is 36.52 kg/m. General:Pleasant affect, NAD Skin:Warm and dry, brisk capillary refill HEENT:normocephalic, sclera clear, mucus membranes moist Neck:supple, no JVD, no bruits  Heart:irreg irreg without murmur, gallup, rub or click Lungs:clear without rales, rhonchi, or wheezes WUJ:WJXB, non tender, + BS, do not palpate liver spleen or masses Ext:2+ lower ext edema to knees, 2+ radial pulses Neuro:alert and oriented X 3, [name], follows commands, + facial symmetry    EKG:  EKG is ordered today. The ekg ordered today demonstrates  atrial fib with premature beat.  Old inf MI and old Ant MI with Q waves. No new changes   Recent Labs: 05/19/2019: BUN 11; Creatinine, Ser 1.15; Hemoglobin 12.0; NT-Pro BNP 340; Platelets 218; Potassium 3.9; Sodium 142    Lipid Panel    Component Value Date/Time   CHOL  05/18/2009 0525    179        ATP III CLASSIFICATION:  <200     mg/dL   Desirable  200-239  mg/dL   Borderline High  >=240    mg/dL   High          TRIG 189 (H) 05/18/2009 0525   HDL 34 (L) 05/18/2009 0525   CHOLHDL 5.3 05/18/2009 0525   VLDL 38 05/18/2009 0525   LDLCALC (H) 05/18/2009 0525    107        Total Cholesterol/HDL:CHD Risk Coronary Heart Disease Risk Table                     Men   Women  1/2 Average Risk   3.4   3.3  Average Risk       5.0   4.4  2 X Average Risk   9.6   7.1  3 X Average Risk  23.4   11.0        Use the calculated Patient Ratio above and the CHD Risk Table to determine the patient's CHD Risk.        ATP III CLASSIFICATION (LDL):  <100     mg/dL   Optimal  100-129  mg/dL   Near or Above                    Optimal  130-159  mg/dL   Borderline  160-189  mg/dL   High  >190     mg/dL   Very High       Other studies Reviewed: Additional studies/ records that were reviewed today include: . Echo 2019 Study Conclusions  - Left ventricle: The cavity size was normal. Wall  thickness was   increased in a pattern of mild LVH. Systolic function was normal.   The estimated ejection fraction was in the range of 50% to 55%. - Mitral valve: There was mild regurgitation. - Left atrium: The atrium was mildly dilated. - Right ventricle: The cavity size was mildly dilated. Systolic   function was moderately reduced. - Right atrium: The atrium was mildly dilated. - Pulmonary arteries: PA peak pressure: 65 mm Hg (S).  Impressions:  - Poor acoustic windows limit study even with Definity use. Echo 10/08/16  Study Conclusions  - Left ventricle: The cavity size was normal. Wall  thickness was increased in a pattern of mild LVH. Systolic function was normal. The estimated ejection fraction was in the range of 60% to 65%. Images were inadequate for LV wall motion assessment. The study was not technically sufficient to allow evaluation of LV diastolic dysfunction due to atrial fibrillation. - Aortic valve: Moderately calcified annulus. Moderately thickened, moderately calcified leaflets. Morphologically, there appears to be at least mild aortic stenosis. - Mitral valve: Calcified annulus. Mildly thickened leaflets . - Left atrium: The atrium was moderately dilated. - Right ventricle: The cavity size was moderately dilated. Systolic function was mildly to moderately reduced. - Right atrium: The atrium was moderately dilated. - Tricuspid valve: There was mild regurgitation. - Pulmonary arteries: PA peak pressure: 46 mm Hg (S). - Systemic veins: Dilated IVC with normal respiratory variation. Estimated CVP 8 mmHg.  Nuc study 10/07/15 Study Highlights    Nuclear stress EF: 55%.  There was no ST segment deviation noted during stress.  There is a small defect of mild severity present in the basal inferior and basal inferolateral location. The defect is non-reversible and consistent with diaphragmatic attenuation and extra cardiac activity. No ischemia noted.  This is a low risk study.  The left ventricular ejection fraction is normal (55-65%).      ASSESSMENT AND PLAN:  1.  DOE and edema diastolic HF he can lie back SOB with exertion.  he will take lasix 80 mg every AM for 4 days then back to 60 in AM and 40 in pm and metolazone 2.5 mg every other day for 4 days then stop.  Follow up in 1 week.with Dr. [name] Will check BMP, BNP, CBC and 2 V CXR 2.  Chest pain once fluid improved will do nuc study.  If abnormal would do Rt and lt cardiac cath.  This may be due to his pulmonary hypertension.  3.  Pulmonary hypertension.BP controlled 4.   Permanent a fib rate controlled  No anticoagulation due to GI bleed 5.  HTN controlled continue current meds.       Current medicines are reviewed with the patient today.  The patient Has no concerns regarding medicines.  The following changes have been made:  See above Labs/ tests ordered today include:see above  Disposition:   FU:  see above  Signed, Cecilie Kicks, NP  05/21/2019 8:34 AM    [address] [place] [address], [place], [address] [place], [place] Phone: [phone]; Fax: [phone]

## 2019-05-20 LAB — BASIC METABOLIC PANEL
BUN/Creatinine Ratio: 10 (ref 10–24)
BUN: 11 mg/dL (ref 8–27)
CO2: 27 mmol/L (ref 20–29)
Calcium: 9.4 mg/dL (ref 8.6–10.2)
Chloride: 100 mmol/L (ref 96–106)
Creatinine, Ser: 1.15 mg/dL (ref 0.76–1.27)
GFR calc Af Amer: 66 mL/min/{1.73_m2} (ref >59)
GFR calc non Af Amer: 57 mL/min/{1.73_m2} — ABNORMAL LOW (ref >59)
Glucose: 127 mg/dL — ABNORMAL HIGH (ref 65–99)
Potassium: 3.9 mmol/L (ref 3.5–5.2)
Sodium: 142 mmol/L (ref 134–144)

## 2019-05-20 LAB — CBC
Hematocrit: 37.7 % (ref 37.5–51.0)
Hemoglobin: 12 g/dL — ABNORMAL LOW (ref 13.0–17.7)
MCH: 27.1 pg (ref 26.6–33.0)
MCHC: 31.8 g/dL (ref 31.5–35.7)
MCV: 85 fL (ref 79–97)
Platelets: 218 10*3/uL (ref 150–450)
RBC: 4.43 x10E6/uL (ref 4.14–5.80)
RDW: 14.5 % (ref 11.6–15.4)
WBC: 7.1 10*3/uL (ref 3.4–10.8)

## 2019-05-20 LAB — PRO B NATRIURETIC PEPTIDE: NT-Pro BNP: 340 pg/mL (ref 0–486)

## 2019-05-21 ENCOUNTER — Ambulatory Visit
Admission: RE | Admit: 2019-05-21 | Discharge: 2019-05-21 | Disposition: A | Discharge status: Home / Self Care | Payer: PPO | Source: Ambulatory Visit | Attending: Cardiology | Admitting: Cardiology

## 2019-05-21 ENCOUNTER — Encounter: Payer: Self-pay | Admitting: Cardiology

## 2019-05-21 ENCOUNTER — Telehealth: Payer: Self-pay | Admitting: *Deleted

## 2019-05-21 DIAGNOSIS — R0602 Shortness of breath: Secondary | ICD-10-CM

## 2019-05-21 NOTE — Telephone Encounter (Signed)
All placed to pt re: message below about ordering [place] for pt. Left a message for pt to call back.

## 2019-05-21 NOTE — Telephone Encounter (Signed)
-----   Message from Bernard Serge, NP sent at 05/20/2019 11:01 PM EDT ----- Can we schedule for lexiscan myoview next week?  For chest pain. Thanks, just let them know his edema should be better and we can get to bottom of chest pain and shortness of breath with exertion.

## 2019-05-21 NOTE — Telephone Encounter (Signed)
Pt called back. He is aware that the stress test will be ordered and verbal instructions have been went over with him/ and wife.

## 2019-05-26 ENCOUNTER — Telehealth (HOSPITAL_COMMUNITY): Payer: Self-pay | Admitting: *Deleted

## 2019-05-26 NOTE — Telephone Encounter (Signed)
Patient's wife given detailed instructions per Myocardial Perfusion Study Information Sheet for the test on 05/29/19 at 10:30. Patient notified to arrive 15 minutes early and that it is imperative to arrive on time for appointment to keep from having the test rescheduled.  If you need to cancel or reschedule your appointment, please call the office within 24 hours of your appointment. . Patient verbalized understanding.Veronia Beets

## 2019-05-28 ENCOUNTER — Ambulatory Visit (HOSPITAL_COMMUNITY): Payer: PPO

## 2019-05-29 ENCOUNTER — Other Ambulatory Visit: Payer: Self-pay

## 2019-05-29 ENCOUNTER — Ambulatory Visit (HOSPITAL_COMMUNITY): Payer: PPO

## 2019-05-29 ENCOUNTER — Ambulatory Visit (HOSPITAL_COMMUNITY): Payer: PPO | Attending: Cardiovascular Disease

## 2019-05-29 DIAGNOSIS — R0602 Shortness of breath: Secondary | ICD-10-CM | POA: Diagnosis not present

## 2019-05-29 LAB — MYOCARDIAL PERFUSION IMAGING
LV dias vol: 109 mL (ref 62–150)
LV sys vol: 45 mL
Peak HR: 86 {beats}/min
Rest HR: 61 {beats}/min
SDS: 1
SRS: 0
SSS: 1
TID: 1.09

## 2019-05-29 MED ORDER — TECHNETIUM TC 99M TETROFOSMIN IV KIT
32.8000 | PACK | Freq: Once | INTRAVENOUS | Status: AC | PRN
  Administered 2019-05-29: 32.8 via INTRAVENOUS
  Filled 2019-05-29: qty 33

## 2019-05-29 MED ORDER — TECHNETIUM TC 99M TETROFOSMIN IV KIT
10.1000 | PACK | Freq: Once | INTRAVENOUS | Status: AC | PRN
  Administered 2019-05-29: 10.1 via INTRAVENOUS
  Filled 2019-05-29: qty 11

## 2019-05-29 MED ORDER — REGADENOSON 0.4 MG/5ML IV SOLN
0.4000 mg | Freq: Once | INTRAVENOUS | Status: AC
  Administered 2019-05-29: 0.4 mg via INTRAVENOUS

## 2019-06-02 ENCOUNTER — Encounter: Payer: Self-pay | Admitting: Internal Medicine

## 2019-06-02 ENCOUNTER — Ambulatory Visit: Payer: PPO | Admitting: Internal Medicine

## 2019-06-02 ENCOUNTER — Other Ambulatory Visit: Payer: Self-pay

## 2019-06-02 VITALS — BP 116/60 | HR 68 | Ht 73.0 in | Wt 270.0 lb

## 2019-06-02 DIAGNOSIS — I5033 Acute on chronic diastolic (congestive) heart failure: Secondary | ICD-10-CM

## 2019-06-02 DIAGNOSIS — I10 Essential (primary) hypertension: Secondary | ICD-10-CM

## 2019-06-02 DIAGNOSIS — I4821 Permanent atrial fibrillation: Secondary | ICD-10-CM

## 2019-06-02 NOTE — Patient Instructions (Addendum)
Medication Instructions:  Your physician recommends that you continue on your current medications as directed. Please refer to the Current Medication list given to you today.  Labwork: None ordered.  Testing/Procedures: None ordered.  Follow-Up: Your physician wants you to follow-up in: 2 months with Truitt Merle.      Any Other Special Instructions Will Be Listed Below (If Applicable).  If you need a refill on your cardiac medications before your next appointment, please call your pharmacy.     DASH Eating Plan DASH stands for "Dietary Approaches to Stop Hypertension." The DASH eating plan is a healthy eating plan that has been shown to reduce high blood pressure (hypertension). It may also reduce your risk for type 2 diabetes, heart disease, and stroke. The DASH eating plan may also help with weight loss. What are tips for following this plan?  General guidelines  Avoid eating more than 2,300 mg (milligrams) of salt (sodium) a day. If you have hypertension, you may need to reduce your sodium intake to 1,500 mg a day.  Limit alcohol intake to no more than 1 drink a day for nonpregnant women and 2 drinks a day for men. One drink equals 12 oz of beer, 5 oz of wine, or 1 oz of hard liquor.  Work with your health care provider to maintain a healthy body weight or to lose weight. Ask what an ideal weight is for you.  Get at least 30 minutes of exercise that causes your heart to beat faster (aerobic exercise) most days of the week. Activities may include walking, swimming, or biking.  Work with your health care provider or diet and nutrition specialist (dietitian) to adjust your eating plan to your individual calorie needs. Reading food labels   Check food labels for the amount of sodium per serving. Choose foods with less than 5 percent of the Daily Value of sodium. Generally, foods with less than 300 mg of sodium per serving fit into this eating plan.  To find whole grains, look  for the word "whole" as the first word in the ingredient list. Shopping  Buy products labeled as "low-sodium" or "no salt added."  Buy fresh foods. Avoid canned foods and premade or frozen meals. Cooking  Avoid adding salt when cooking. Use salt-free seasonings or herbs instead of table salt or sea salt. Check with your health care provider or pharmacist before using salt substitutes.  Do not fry foods. Cook foods using healthy methods such as baking, boiling, grilling, and broiling instead.  Cook with heart-healthy oils, such as olive, canola, soybean, or sunflower oil. Meal planning  Eat a balanced diet that includes: ? 5 or more servings of fruits and vegetables each day. At each meal, try to fill half of your plate with fruits and vegetables. ? Up to 6-8 servings of whole grains each day. ? Less than 6 oz of lean meat, poultry, or fish each day. A 3-oz serving of meat is about the same size as a deck of cards. One egg equals 1 oz. ? 2 servings of low-fat dairy each day. ? A serving of nuts, seeds, or beans 5 times each week. ? Heart-healthy fats. Healthy fats called Omega-3 fatty acids are found in foods such as flaxseeds and coldwater fish, like sardines, salmon, and mackerel.  Limit how much you eat of the following: ? Canned or prepackaged foods. ? Food that is high in trans fat, such as fried foods. ? Food that is high in saturated fat, such as  fatty meat. ? Sweets, desserts, sugary drinks, and other foods with added sugar. ? Full-fat dairy products.  Do not salt foods before eating.  Try to eat at least 2 vegetarian meals each week.  Eat more home-cooked food and less restaurant, buffet, and fast food.  When eating at a restaurant, ask that your food be prepared with less salt or no salt, if possible. What foods are recommended? The items listed may not be a complete list. Talk with your dietitian about what dietary choices are best for you. Grains Whole-grain or  whole-wheat bread. Whole-grain or whole-wheat pasta. Brown rice. [place] Morrow. Bulgur. Whole-grain and low-sodium cereals. Pita bread. Low-fat, low-sodium crackers. Whole-wheat flour tortillas. Vegetables Fresh or frozen vegetables (raw, steamed, roasted, or grilled). Low-sodium or reduced-sodium tomato and vegetable juice. Low-sodium or reduced-sodium tomato sauce and tomato paste. Low-sodium or reduced-sodium canned vegetables. Fruits All fresh, dried, or frozen fruit. Canned fruit in natural juice (without added sugar). Meat and other protein foods Skinless chicken or [place]. Ground chicken or [place]. Pork with fat trimmed off. Fish and seafood. Egg whites. Dried beans, peas, or lentils. Unsalted nuts, nut butters, and seeds. Unsalted canned beans. Lean cuts of beef with fat trimmed off. Low-sodium, lean deli meat. Dairy Low-fat (1%) or fat-free (skim) milk. Fat-free, low-fat, or reduced-fat cheeses. Nonfat, low-sodium ricotta or cottage cheese. Low-fat or nonfat yogurt. Low-fat, low-sodium cheese. Fats and oils Soft margarine without trans fats. Vegetable oil. Low-fat, reduced-fat, or light mayonnaise and salad dressings (reduced-sodium). Canola, safflower, olive, soybean, and sunflower oils. Avocado. Seasoning and other foods Herbs. Spices. Seasoning mixes without salt. Unsalted popcorn and pretzels. Fat-free sweets. What foods are not recommended? The items listed may not be a complete list. Talk with your dietitian about what dietary choices are best for you. Grains Baked goods made with fat, such as croissants, muffins, or some breads. Dry pasta or rice meal packs. Vegetables Creamed or fried vegetables. Vegetables in a cheese sauce. Regular canned vegetables (not low-sodium or reduced-sodium). Regular canned tomato sauce and paste (not low-sodium or reduced-sodium). Regular tomato and vegetable juice (not low-sodium or reduced-sodium). Angie Fava. Olives. Fruits Canned fruit in a light  or heavy syrup. Fried fruit. Fruit in cream or butter sauce. Meat and other protein foods Fatty cuts of meat. Ribs. Fried meat. Berniece Salines. Sausage. Bologna and other processed lunch meats. Salami. Fatback. Hotdogs. Bratwurst. Salted nuts and seeds. Canned beans with added salt. Canned or smoked fish. Whole eggs or egg yolks. Chicken or [place] with skin. Dairy Whole or 2% milk, cream, and half-and-half. Whole or full-fat cream cheese. Whole-fat or sweetened yogurt. Full-fat cheese. Nondairy creamers. Whipped toppings. Processed cheese and cheese spreads. Fats and oils Butter. Stick margarine. Lard. Shortening. Ghee. Bacon fat. Tropical oils, such as coconut, palm kernel, or palm oil. Seasoning and other foods Salted popcorn and pretzels. Onion salt, garlic salt, seasoned salt, table salt, and sea salt. Worcestershire sauce. Tartar sauce. Barbecue sauce. Teriyaki sauce. Soy sauce, including reduced-sodium. Steak sauce. Canned and packaged gravies. Fish sauce. Oyster sauce. Cocktail sauce. Horseradish that you find on the shelf. Ketchup. Mustard. Meat flavorings and tenderizers. Bouillon cubes. Hot sauce and Tabasco sauce. Premade or packaged marinades. Premade or packaged taco seasonings. Relishes. Regular salad dressings. Where to find more information:  National Heart, Lung, and [place]: https://wilson-eaton.com/  American Heart Association: www.heart.org Summary  The DASH eating plan is a healthy eating plan that has been shown to reduce high blood pressure (hypertension). It may also reduce your risk for  type 2 diabetes, heart disease, and stroke.  With the DASH eating plan, you should limit salt (sodium) intake to 2,300 mg a day. If you have hypertension, you may need to reduce your sodium intake to 1,500 mg a day.  When on the DASH eating plan, aim to eat more fresh fruits and vegetables, whole grains, lean proteins, low-fat dairy, and heart-healthy fats.  Work with your health care provider or  diet and nutrition specialist (dietitian) to adjust your eating plan to your individual calorie needs. This information is not intended to replace advice given to you by your health care provider. Make sure you discuss any questions you have with your health care provider. Document Released: 07/27/2011 Document Revised: 07/20/2017 Document Reviewed: 07/31/2016 Elsevier Patient Education  2020 [employer].

## 2019-06-02 NOTE — Progress Notes (Signed)
PCP: [name], MD   Primary EP: Dr York Cerise is a 85 y.o. male who presents today for routine electrophysiology followup.  Since last being seen in our clinic, the patient reports doing reasonably well.  He is not very active.  Has had ongoing issues with SOB and edema for months.  He eats a ham biscuit each day.  Occasional atypical chest pain.  Today, he denies symptoms of palpitations, dizziness, presyncope, or syncope.  The patient is otherwise without complaint today.   Past Medical History:  Diagnosis Date  . Anemia    04/2012: H&H-10.2/31.5, MCV-77; 06/2012:12/38.9  . Arthritis   . Benign prostatic hypertrophy    s/p transurethral resection of the prostate  . Chest pain    2004; with dyspnea, stress nuclear-normal EF + questionable inferior wall ischemia, normal coronary angiography;  2010-negative stress nuclear  . CVA (cerebral infarction)    asymptomatic (seen on CT)  . Diarrhea    h/o Hemoccult-positive stool  . Dysrhythmia   . Edema of both legs   . GERD (gastroesophageal reflux disease)   . History of blood transfusion   . History of kidney stones   . History of urinary tract infection   . Hypertension   . Jerking movements of extremities    left arm   . Low back pain   . Obstructive sleep apnea    pt states can not use the CPAP  . Peripheral neuropathy    lower legs and feet bilat   . Persistent atrial fibrillation ([place]) 2013   Asymptomatic; diagnosed in 05/2012  . Pneumonia   . Prostate cancer ([place]) 2017   stage 4  . Shortness of breath dyspnea    pt states only develops SOB if tries to do something too quickly  . Upper GI bleed 1985   1985-peptic ulcer disease; initial hemigastrectomy; subsequent Billroth II  . Urinary hesitancy   . Vertigo    ED evaluation-06/2012   Past Surgical History:  Procedure Laterality Date  . Billroth II    . CHOLECYSTECTOMY    . COLONOSCOPY  Approximately 2000  . COLONOSCOPY  10/2012   Colonoscopy March 2014 at [facility], difficulty exam requiring fluoroscopy and overtube. Patient developed severe bradycardia again during his procedure like he did [facility]. Multiple polyps removed which were tubular adenomas. Previously tattooed sites at 20 and 30 cm were free of any polypoid change. Left-sided diverticulosis noted.  . COLONOSCOPY WITH ESOPHAGOGASTRODUODENOSCOPY (EGD)  06-17-2009   KYH:CWCBJSEG tubular adenomas and 2 tubulovillous adenomas, incomplete, 3 clips placed  . ESOPHAGOGASTRODUODENOSCOPY  06/17/2009   SLF: normal/chronic gastritis (non-h pylori)  . ESOPHAGOGASTRODUODENOSCOPY N/A 05/11/2015   BTD:VVOHYWV anemai due to postgastrectomy state/moderate erosive gastritis  . INGUINAL HERNIA REPAIR     Left  . ROTATOR CUFF REPAIR  1991   Bilateral  . TRANSURETHRAL RESECTION OF PROSTATE  06/2016  . TRANSURETHRAL RESECTION OF PROSTATE N/A 12/27/2012   Procedure: TRANSURETHRAL RESECTION OF THE PROSTATE (TURP);  Surgeon: Marissa Nestle, MD;  Location: [facility];  Service: Urology;  Laterality: N/A;  . TRANSURETHRAL RESECTION OF PROSTATE N/A 04/11/2016   Procedure: TRANSURETHRAL RESECTION OF THE PROSTATE (TURP);  Surgeon: Irine Seal, MD;  Location: [facility];  Service: Urology;  Laterality: N/A;  . VAGOTOMY AND PYLOROPLASTY  1970s   Details of procedure uncertain; 3710G    ROS- all systems are reviewed and negatives except as per HPI above  Current Outpatient Medications  Medication Sig Dispense  Refill  . clobetasol cream (TEMOVATE) 3.41 % Apply 1 application topically daily as needed (use as directed for skin).     Marland Kitchen clopidogrel (PLAVIX) 75 MG tablet Take 75 mg by mouth daily.  0  . furosemide (LASIX) 40 MG tablet TAKE 1.5 TABLET BY MOUTH EVERY MORNING THEN 1 TABLET BY MOUTH EVERY AFTERNOON 225 tablet 1  . HYDROcodone-acetaminophen (NORCO) 10-325 MG tablet TAKE 1 TABLET  FIVE TIMES DAILY AS NEEDED FOR PAIN  0  . hydrOXYzine (ATARAX/VISTARIL) 50 MG tablet Take 1 tablet (50 mg  total) by mouth every 6 (six) hours as needed for itching. 30 tablet 0  . losartan (COZAAR) 100 MG tablet Take 1 tablet by mouth daily  2  . meclizine (ANTIVERT) 25 MG tablet Take 25 mg by mouth at bedtime.     . metolazone (ZAROXOLYN) 2.5 MG tablet Take 1 tablet (2.5 mg total) by mouth every other day. 2 tablet 0  . nystatin (MYCOSTATIN/NYSTOP) 100000 UNIT/GM POWD Apply 1 g topically daily as needed (rash).     . pantoprazole (PROTONIX) 40 MG tablet Take 40 mg by mouth daily.    . potassium chloride SA (K-DUR,KLOR-CON) 20 MEQ tablet Take 1 tablet (20 mEq total) by mouth daily. 45 tablet 6  . triamcinolone cream (KENALOG) 0.1 % Apply 1 application topically daily as needed (for irritation).      No current facility-administered medications for this visit.     Physical Exam: Vitals:   06/02/19 1527  BP: 116/60  Pulse: 68  SpO2: 91%  Weight: 270 lb (122.5 kg)  Height: 6\' 1"  (1.854 m)    GEN- The patient is chronically ill appearing, alert and oriented x 3 today.  Overweight,  In a wheelchair today Head- normocephalic, atraumatic Eyes-  Sclera clear, conjunctiva pink Ears- hearing intact Oropharynx- clear Lungs- decreased BS at the bases, normal work of breathing Heart- irregular rate and rhythm  GI- soft, NT, ND, + BS Extremities- no clubbing, cyanosis, +2 dependant edema  Wt Readings from Last 3 Encounters:  06/02/19 270 lb (122.5 kg)  05/19/19 276 lb 12.8 oz (125.6 kg)  12/10/17 269 lb 3.2 oz (122.1 kg)    EKG tracing ordered today is personally reviewed and shows afib, V rates controlled  Recent myoview was reviewed and normal  Assessment and Plan:  1. Longstanding persistent afib Rate controlled Not a candidate for [facility] therapy  2. Acute on chronic diastolic dysfunction This is a chronic issue and likely exacerbated by obesity, sedentary lifestyle and dietary noncompliance I worry that we have little to offer Sodium restriction is advised Continue lasix  3.  Hypertensive cardiovascular disease Stable No change required today  4. Obesity Body mass index is 35.62 kg/m. Lifestyle modification encouraged  5. OSA intolerant of CPAP   Return to see Truitt Merle in 2 months  Thompson Grayer MD, [facility] 06/02/2019 3:44 PM

## 2019-06-09 ENCOUNTER — Encounter: Payer: Self-pay | Admitting: Family Medicine

## 2019-06-09 ENCOUNTER — Ambulatory Visit (INDEPENDENT_AMBULATORY_CARE_PROVIDER_SITE_OTHER): Payer: PPO | Admitting: Family Medicine

## 2019-06-09 ENCOUNTER — Other Ambulatory Visit: Payer: Self-pay

## 2019-06-09 VITALS — BP 110/76 | HR 70 | Temp 97.7°F | Resp 18 | Ht 73.0 in | Wt 272.0 lb

## 2019-06-09 DIAGNOSIS — Z8719 Personal history of other diseases of the digestive system: Secondary | ICD-10-CM

## 2019-06-09 DIAGNOSIS — I4819 Other persistent atrial fibrillation: Secondary | ICD-10-CM

## 2019-06-09 DIAGNOSIS — C61 Malignant neoplasm of prostate: Secondary | ICD-10-CM | POA: Diagnosis not present

## 2019-06-09 DIAGNOSIS — G8929 Other chronic pain: Secondary | ICD-10-CM

## 2019-06-09 DIAGNOSIS — M1712 Unilateral primary osteoarthritis, left knee: Secondary | ICD-10-CM | POA: Diagnosis not present

## 2019-06-09 DIAGNOSIS — Z7689 Persons encountering health services in other specified circumstances: Secondary | ICD-10-CM

## 2019-06-09 DIAGNOSIS — M549 Dorsalgia, unspecified: Secondary | ICD-10-CM | POA: Diagnosis not present

## 2019-06-09 DIAGNOSIS — Z8673 Personal history of transient ischemic attack (TIA), and cerebral infarction without residual deficits: Secondary | ICD-10-CM | POA: Diagnosis not present

## 2019-06-09 MED ORDER — HYDROCODONE-ACETAMINOPHEN 10-325 MG PO TABS: ORAL_TABLET | ORAL | 0 refills | Status: DC

## 2019-06-10 NOTE — Progress Notes (Signed)
Subjective:    Patient ID: Bernard Ramirez, male    DOB: 05-27-1931, 85 y.o.   MRN: [id number]  HPI Patient is a very pleasant 85 year old male here today to establish care.  Patient was previously under the care of Dr. Sinda Du who has retired.  He is transferring his care to this office.  Patient's past medical history is significant for persistent atrial fibrillation.  However he is on no oral anticoagulant due to a history of a GI bleed.  This is well documented in his chart.  He is on Plavix however given history of a stroke in 2017.  MRI at that time revealed an acute left occipital infarct.  Patient also had a prostate biopsy performed in 2017 which revealed prostate adenocarcinoma with a Gleason score of 9.  Bone scan at that time revealed a possible increased uptake in the left sixth rib as well as in the right sternum.  However patient declined treatment and has not seen urology since that time.  Patient is also on hydrocodone/acetaminophen 10/325.  He takes 5 tablets a day.  He has been getting 150 tablets every month under the care of his primary care physician.  I reviewed the drug database and it is consistent that he has been receiving 150 tablets monthly from Dr. Sinda Du.  He is not receiving any other pain medication from any other doctor.  The patient is unable to tolerate NSAIDs for his chronic arthritic pain in his knees and in his lower back due to his history of GI bleeding.  Patient is asking if I will continue this for him.  I believe this is appropriate given his age and his frailty.  He does complain of severe lower back pain.  We discussed this today.  Pain is located roughly at the level of L5 in the center of the back.  I am concerned that the patient may have prostate metastasis to his lower back.  We discussed possibly repeating a bone scan to determine this however at the present time patient still declines treatment and does not want to proceed with a bone scan.   He is asking if I will inject his left knee with cortisone.  His last cortisone injection was in June per his report.  He has been getting these occasionally from his PCP for arthritis in the left knee.  This provides some symptomatic relief.  Otherwise he is doing well.  He does have some mild memory loss and his wife accompanies him on today's exam to help provide additional history. Past Medical History:  Diagnosis Date   Anemia    04/2012: H&H-10.2/31.5, MCV-77; 06/2012:12/38.9   Arthritis    Benign prostatic hypertrophy    s/p transurethral resection of the prostate   Chest pain    2004; with dyspnea, stress nuclear-normal EF + questionable inferior wall ischemia, normal coronary angiography;  2010-negative stress nuclear   CVA (cerebral infarction)    asymptomatic (seen on CT)   Diarrhea    h/o Hemoccult-positive stool   Dysrhythmia    Edema of both legs    GERD (gastroesophageal reflux disease)    History of blood transfusion    History of kidney stones    History of urinary tract infection    Hypertension    Jerking movements of extremities    left arm    Low back pain    Obstructive sleep apnea    pt states can not use the CPAP   Peripheral  neuropathy    lower legs and feet bilat    Persistent atrial fibrillation (Bernard Ramirez) 2013   Asymptomatic; diagnosed in 05/2012   Pneumonia    Prostate cancer (Bernard Ramirez) 2017   stage 4   Shortness of breath dyspnea    pt states only develops SOB if tries to do something too quickly   Upper GI bleed 1985   1985-peptic ulcer disease; initial hemigastrectomy; subsequent Billroth II   Urinary hesitancy    Vertigo    ED evaluation-06/2012   Past Surgical History:  Procedure Laterality Date   Billroth II     CHOLECYSTECTOMY     COLONOSCOPY  Approximately 2000   COLONOSCOPY  10/2012   Colonoscopy March 2014 at [facility], difficulty exam requiring fluoroscopy and overtube. Patient developed severe bradycardia again during  his procedure like he did [facility]. Multiple polyps removed which were tubular adenomas. Previously tattooed sites at 20 and 30 cm were free of any polypoid change. Left-sided diverticulosis noted.   COLONOSCOPY WITH ESOPHAGOGASTRODUODENOSCOPY (EGD)  06-17-2009   SEG:BTDVVOHY tubular adenomas and 2 tubulovillous adenomas, incomplete, 3 clips placed   ESOPHAGOGASTRODUODENOSCOPY  06/17/2009   SLF: normal/chronic gastritis (non-h pylori)   ESOPHAGOGASTRODUODENOSCOPY N/A 05/11/2015   WVP:XTGGYIR anemai due to postgastrectomy state/moderate erosive gastritis   INGUINAL HERNIA REPAIR     Left   ROTATOR CUFF REPAIR  1991   Bilateral   TRANSURETHRAL RESECTION OF PROSTATE  06/2016   TRANSURETHRAL RESECTION OF PROSTATE N/A 12/27/2012   Procedure: TRANSURETHRAL RESECTION OF THE PROSTATE (TURP);  Surgeon: Marissa Nestle, MD;  Location: [facility];  Service: Urology;  Laterality: N/A;   TRANSURETHRAL RESECTION OF PROSTATE N/A 04/11/2016   Procedure: TRANSURETHRAL RESECTION OF THE PROSTATE (TURP);  Surgeon: Irine Seal, MD;  Location: [facility];  Service: Urology;  Laterality: N/A;   VAGOTOMY AND PYLOROPLASTY  1970s   Details of procedure uncertain; 4854O   Current Outpatient Medications on File Prior to Visit  Medication Sig Dispense Refill   clobetasol cream (TEMOVATE) 2.70 % Apply 1 application topically daily as needed (use as directed for skin).      clopidogrel (PLAVIX) 75 MG tablet Take 75 mg by mouth daily.  0   furosemide (LASIX) 40 MG tablet TAKE 1.5 TABLET BY MOUTH EVERY MORNING THEN 1 TABLET BY MOUTH EVERY AFTERNOON 225 tablet 1   hydrOXYzine (ATARAX/VISTARIL) 50 MG tablet Take 1 tablet (50 mg total) by mouth every 6 (six) hours as needed for itching. 30 tablet 0   losartan (COZAAR) 100 MG tablet Take 1 tablet by mouth daily  2   meclizine (ANTIVERT) 25 MG tablet Take 25 mg by mouth at bedtime.      nystatin (MYCOSTATIN/NYSTOP) 100000 UNIT/GM POWD Apply 1 g topically daily  as needed (rash).      pantoprazole (PROTONIX) 40 MG tablet Take 40 mg by mouth daily.     potassium chloride SA (K-DUR,KLOR-CON) 20 MEQ tablet Take 1 tablet (20 mEq total) by mouth daily. 45 tablet 6   triamcinolone cream (KENALOG) 0.1 % Apply 1 application topically daily as needed (for irritation).      No current facility-administered medications on file prior to visit.    Allergies  Allergen Reactions   Contrast Media [Iodinated Diagnostic Agents]     Pt states he was given "dye" 20 yrs ago, anaphylaxis & syncope - was told to never have it again    Gabapentin Itching   Aspirin Other (See Comments)    Blistering  Latex Rash   Lipitor [Atorvastatin] Itching   Tape Rash    EKG leads   Social History   Socioeconomic History   Marital status: Married    Spouse name: Not on file   Number of children: 5   Years of education: Not on file   Highest education level: Not on file  Occupational History   Occupation: Retired Pensions consultant: [employer] resource strain: Not on file   Food insecurity    Worry: Not on file    Inability: Not on file   Transportation needs    Medical: Not on file    Non-medical: Not on file  Tobacco Use   Smoking status: Former Smoker    Years: 12.00    Types: Cigars    Quit date: 08/22/2003    Years since quitting: 15.8   Smokeless tobacco: Never Used   Tobacco comment: Quit many years ago; few cigars per day; didn't inhale  Substance and Sexual Activity   Alcohol use: No    Alcohol/week: 1.0 standard drinks    Types: 1 Standard drinks or equivalent per week   Drug use: No   Sexual activity: Not on file  Lifestyle   Physical activity    Days per week: Not on file    Minutes per session: Not on file   Stress: Not on file  Relationships   Social connections    Talks on phone: Not on file    Gets together: Not on file    Attends religious service: Not on file    Active  member of club or organization: Not on file    Attends meetings of clubs or organizations: Not on file    Relationship status: Not on file   Intimate partner violence    Fear of current or ex partner: Not on file    Emotionally abused: Not on file    Physically abused: Not on file    Forced sexual activity: Not on file  Other Topics Concern   Not on file  Social History Narrative   Lives w/ wife in [place]   Retired truck driver      Review of Systems  All other systems reviewed and are negative.      Objective:   Physical Exam Vitals signs reviewed.  Constitutional:      General: He is not in acute distress.    Appearance: Normal appearance. He is obese. He is not ill-appearing or toxic-appearing.  Cardiovascular:     Rate and Rhythm: Normal rate. Rhythm irregular.     Heart sounds: Normal heart sounds. No murmur. No friction rub. No gallop.   Pulmonary:     Effort: Pulmonary effort is normal. No respiratory distress.     Breath sounds: Normal breath sounds. No stridor. No wheezing, rhonchi or rales.  Abdominal:     General: Abdomen is flat. Bowel sounds are normal. There is no distension.     Palpations: Abdomen is soft. There is no mass.     Tenderness: There is no abdominal tenderness. There is no guarding.  Musculoskeletal:     Left knee: He exhibits decreased range of motion and swelling. Tenderness found. Medial joint line and lateral joint line tenderness noted.     Right lower leg: No edema.     Left lower leg: No edema.  Neurological:     General: No focal deficit present.     Mental Status: He is  alert and oriented to person, place, and time.     Cranial Nerves: No cranial nerve deficit.     Sensory: No sensory deficit.     Motor: No weakness.     Coordination: Coordination normal.     Gait: Gait abnormal (Shuffling gait.  Requires a cane to ambulate).     Deep Tendon Reflexes: Reflexes normal.  Psychiatric:        Mood and Affect: Mood normal.          Thought Content: Thought content normal.        Judgment: Judgment normal.           Assessment & Plan:  Establishing care with new doctor, encounter for  Prostate cancer (Bernard Ramirez)  Persistent atrial fibrillation (Bernard Ramirez)  History of CVA (cerebrovascular accident)  History of GI bleed  Chronic midline back pain, unspecified back location  Primary osteoarthritis of left knee  Spent 30 minutes today with the patient.  I reviewed the national narcotic drug registry.  His report is consistent with the amount of pain medication he has been receiving.  Therefore I will continue to refill the patient's hydrocodone/acetaminophen 10/325 1 tablet every 4-6 hours as needed.  I will give him 150 tablets/month equating to 5 tablets/day.  Patient does have atrial fibrillation however he has been deemed inappropriate for oral anticoagulation due to a GI bleed.  Therefore he will continue Plavix.  He last had lab work drawn in September 28 and there was no evidence of anemia and he.denies any melena or hematochezia so I will recheck blood counts in 3 months with a CBC.  He does have a history of a CVA for which she is taking Plavix for secondary prevention of stroke.  I have recommended rechecking his cholesterol in 3 months.  His goal LDL cholesterol would be less than 70.  For his osteoarthritis in his left knee, using sterile technique, I injected the left knee with 2 cc lidocaine, 2 cc of Marcaine, and 2 cc of 40 mg/mL Kenalog.  Patient tolerated the procedure well without complication.  Regarding his prostate cancer I explained the results of his biopsy in 2017.  Also explained the results of his bone scan.  I have recommended repeating his bone scan given his persistent low back pain in his left knee pain to evaluate for any other evidence of metastasis which may change therapy.  Patient is still of the opinion not to treat his prostate cancer and denies the bone scan today which I believe is reasonable.   Follow-up in 3 months.

## 2019-06-25 DIAGNOSIS — L570 Actinic keratosis: Secondary | ICD-10-CM | POA: Diagnosis not present

## 2019-07-14 ENCOUNTER — Telehealth: Payer: Self-pay | Admitting: Family Medicine

## 2019-07-14 ENCOUNTER — Other Ambulatory Visit: Payer: Self-pay

## 2019-07-14 MED ORDER — HYDROCODONE-ACETAMINOPHEN 10-325 MG PO TABS: ORAL_TABLET | ORAL | 0 refills | Status: DC

## 2019-07-14 NOTE — Telephone Encounter (Signed)
Patient calling to get refill on hydrocodone  [facility] [address]

## 2019-07-14 NOTE — Telephone Encounter (Signed)
Requested Prescriptions   Pending Prescriptions Disp Refills  . HYDROcodone-acetaminophen (NORCO) 10-325 MG tablet 150 tablet 0    Sig: TAKE 1 TABLET  FIVE TIMES DAILY AS NEEDED FOR PAIN     Last OV 06/09/2019   Last written 06/09/2019

## 2019-07-15 NOTE — Progress Notes (Signed)
CARDIOLOGY OFFICE NOTE  Date:  07/23/2019    Bernard Ramirez Date of Birth: [birth year] Medical Record #[id number]  PCP:  Bernard Frizzle, MD  Cardiologist:  Allred  Chief Complaint  Patient presents with  . Follow-up    Seen for Dr. Rayann Ramirez    History of Present Illness: Bernard Ramirez is a 85 y.o. male who presents today for a 2 month check. Seen for Dr. Rayann Ramirez.   He has a history of persistent AFib, not on anticoagulation due to prior GI bleed/anemia. Other issues include prior CVA, OSA intolerant to CPAP, prostate cancer, HTN, chronic diastolic HF and mild AS. He has had issues with chronic back pain and is quite sedentary. He has had intermittent issues with swelling/DOE and required additional diuretics. Noted he likes salt.   Last seen in October by Dr. Rayann Ramirez. Having more DOE, edema and chest pain. Weight was up 9 pounds. He noted that there was not much left for [place] to do for him - he has considerable dietary indiscretion, sedentary lifestyle, etc. Myoview was updated and was reassuring.    The patient does not have symptoms concerning for COVID-19 infection (fever, chills, cough, or new shortness of breath).   Comes in today. Here with his wife - Bernard Ramirez. He is in a wheelchair. He says he has stopped his "ham biscuits every morning". He has been able to lose weight - he is not sure if he can keep on - notes "my wife is a good cook". She notes she would have "put a chain and lock on the fridge to keep him out". He remains pretty inactive. BP is ok. Breathing remains short with activity. No chest pain.    Past Medical History:  Diagnosis Date  . Anemia    04/2012: H&H-10.2/31.5, MCV-77; 06/2012:12/38.9  . Arthritis   . Benign prostatic hypertrophy    s/p transurethral resection of the prostate  . Chest pain    2004; with dyspnea, stress nuclear-normal EF + questionable inferior wall ischemia, normal coronary angiography;  2010-negative stress nuclear  . CVA (cerebral  infarction)    asymptomatic (seen on CT)  . Diarrhea    h/o Hemoccult-positive stool  . Dysrhythmia   . Edema of both legs   . GERD (gastroesophageal reflux disease)   . History of blood transfusion   . History of kidney stones   . History of urinary tract infection   . Hypertension   . Jerking movements of extremities    left arm   . Low back pain   . Obstructive sleep apnea    pt states can not use the CPAP  . Peripheral neuropathy    lower legs and feet bilat   . Persistent atrial fibrillation ([place]) 2013   Asymptomatic; diagnosed in 05/2012  . Pneumonia   . Prostate cancer ([place]) 2017   stage 4  . Shortness of breath dyspnea    pt states only develops SOB if tries to do something too quickly  . Upper GI bleed 1985   1985-peptic ulcer disease; initial hemigastrectomy; subsequent Billroth II  . Urinary hesitancy   . Vertigo    ED evaluation-06/2012    Past Surgical History:  Procedure Laterality Date  . Billroth II    . CHOLECYSTECTOMY    . COLONOSCOPY  Approximately 2000  . COLONOSCOPY  10/2012   Colonoscopy March 2014 at [facility], difficulty exam requiring fluoroscopy and overtube. Patient developed severe bradycardia again during his procedure like he  did [facility]. Multiple polyps removed which were tubular adenomas. Previously tattooed sites at 20 and 30 cm were free of any polypoid change. Left-sided diverticulosis noted.  . COLONOSCOPY WITH ESOPHAGOGASTRODUODENOSCOPY (EGD)  06-17-2009   WUX:LKGMWNUU tubular adenomas and 2 tubulovillous adenomas, incomplete, 3 clips placed  . ESOPHAGOGASTRODUODENOSCOPY  06/17/2009   SLF: normal/chronic gastritis (non-h pylori)  . ESOPHAGOGASTRODUODENOSCOPY N/A 05/11/2015   VOZ:DGUYQIH anemai due to postgastrectomy state/moderate erosive gastritis  . INGUINAL HERNIA REPAIR     Left  . ROTATOR CUFF REPAIR  1991   Bilateral  . TRANSURETHRAL RESECTION OF PROSTATE  06/2016  . TRANSURETHRAL RESECTION OF PROSTATE N/A 12/27/2012    Procedure: TRANSURETHRAL RESECTION OF THE PROSTATE (TURP);  Surgeon: Marissa Nestle, MD;  Location: [facility];  Service: Urology;  Laterality: N/A;  . TRANSURETHRAL RESECTION OF PROSTATE N/A 04/11/2016   Procedure: TRANSURETHRAL RESECTION OF THE PROSTATE (TURP);  Surgeon: Irine Seal, MD;  Location: [facility];  Service: Urology;  Laterality: N/A;  . VAGOTOMY AND PYLOROPLASTY  1970s   Details of procedure uncertain; 4742V     Medications: Current Meds  Medication Sig  . clobetasol cream (TEMOVATE) 9.56 % Apply 1 application topically daily as needed (use as directed for skin).   Marland Kitchen clopidogrel (PLAVIX) 75 MG tablet Take 75 mg by mouth daily.  . furosemide (LASIX) 40 MG tablet TAKE 1.5 TABLET BY MOUTH EVERY MORNING THEN 1 TABLET BY MOUTH EVERY AFTERNOON  . HYDROcodone-acetaminophen (NORCO) 10-325 MG tablet TAKE 1 TABLET  FIVE TIMES DAILY AS NEEDED FOR PAIN  . hydrOXYzine (ATARAX/VISTARIL) 50 MG tablet Take 1 tablet (50 mg total) by mouth every 6 (six) hours as needed for itching.  . losartan (COZAAR) 100 MG tablet Take 1 tablet by mouth daily  . meclizine (ANTIVERT) 25 MG tablet Take 25 mg by mouth at bedtime.   Marland Kitchen nystatin (MYCOSTATIN/NYSTOP) 100000 UNIT/GM POWD Apply 1 g topically daily as needed (rash).   . pantoprazole (PROTONIX) 40 MG tablet Take 40 mg by mouth daily.  . potassium chloride SA (K-DUR,KLOR-CON) 20 MEQ tablet Take 1 tablet (20 mEq total) by mouth daily.  Marland Kitchen triamcinolone cream (KENALOG) 0.1 % Apply 1 application topically daily as needed (for irritation).      Allergies: Allergies  Allergen Reactions  . Contrast Media [Iodinated Diagnostic Agents]     Pt states he was given "dye" 20 yrs ago, anaphylaxis & syncope - was told to never have it again   . Gabapentin Itching  . Aspirin Other (See Comments)    Blistering   . Latex Rash  . Lipitor [Atorvastatin] Itching  . Tape Rash    EKG leads    Social History: The patient  reports that he quit smoking about 15 years ago.  His smoking use included cigars. He quit after 12.00 years of use. He has never used smokeless tobacco. He reports that he does not drink alcohol or use drugs.   Family History: The patient's family history includes Allergic rhinitis in his father and mother; Diabetes Mellitus II in his mother; Lung disease in his father.   Review of Systems: Please see the history of present illness.   All other systems are reviewed and negative.   Physical Exam: VS:  BP 118/78   Pulse 97   Ht 6' (1.829 m)   Wt 261 lb 12.8 oz (118.8 kg)   SpO2 95%   BMI 35.51 kg/m  .  BMI Body mass index is 35.51 kg/m.  Wt Readings from  Last 3 Encounters:  07/23/19 261 lb 12.8 oz (118.8 kg)  06/09/19 272 lb (123.4 kg)  06/02/19 270 lb (122.5 kg)    General: Alert. Elderly. He is in no acute distress. He has lost 11 pounds.   HEENT: Normal.  Neck: Supple, no JVD, carotid bruits, or masses noted.  Cardiac: Irregular irregular rhythm. Rate is ok. Heart tones are distant. Less edema.  Respiratory:  Lungs are clear to auscultation bilaterally with normal work of breathing.  GI: Soft and nontender.  MS: No deformity or atrophy. Gait not tested. He is in a wheelchair.  Skin: Warm and dry. Color is normal.  Neuro:  Strength and sensation are intact and no gross focal deficits noted.  Psych: Alert, appropriate and with normal affect.   LABORATORY DATA:  EKG:  EKG is not ordered today.  Lab Results  Component Value Date   WBC 7.1 05/19/2019   HGB 12.0 (L) 05/19/2019   HCT 37.7 05/19/2019   PLT 218 05/19/2019   GLUCOSE 127 (H) 05/19/2019   CHOL  05/18/2009    179        ATP III CLASSIFICATION:  <200     mg/dL   Desirable  200-239  mg/dL   Borderline High  >=240    mg/dL   High          TRIG 189 (H) 05/18/2009   HDL 34 (L) 05/18/2009   LDLCALC (H) 05/18/2009    107        Total Cholesterol/HDL:CHD Risk Coronary Heart Disease Risk Table                     Men   Women  1/2 Average Risk   3.4   3.3   Average Risk       5.0   4.4  2 X Average Risk   9.6   7.1  3 X Average Risk  23.4   11.0        Use the calculated Patient Ratio above and the CHD Risk Table to determine the patient's CHD Risk.        ATP III CLASSIFICATION (LDL):  <100     mg/dL   Optimal  100-129  mg/dL   Near or Above                    Optimal  130-159  mg/dL   Borderline  160-189  mg/dL   High  >190     mg/dL   Very High   ALT 12 06/24/2012   AST 21 06/24/2012   NA 142 05/19/2019   K 3.9 05/19/2019   CL 100 05/19/2019   CREATININE 1.15 05/19/2019   BUN 11 05/19/2019   CO2 27 05/19/2019   TSH 2.995 Test methodology is 3rd generation TSH 06/17/2009   INR 1.19 05/03/2015       BNP (last 3 results) No results for input(s): BNP in the last 8760 hours.  ProBNP (last 3 results) Recent Labs    05/19/19 1618  PROBNP 340     Other Studies Reviewed Today:  Myoview Study Highlights 05/2019    Nuclear stress EF: 58%.  The study is normal.  This is a low risk study.  The left ventricular ejection fraction is normal (55-65%).   Normal resting and stress perfusion. No ischemia or infarction EF 58%    Echo Study Conclusions 11/2017  - Left ventricle: The cavity size was normal. Wall thickness was  increased in a pattern of mild LVH. Systolic function was normal.   The estimated ejection fraction was in the range of 50% to 55%. - Mitral valve: There was mild regurgitation. - Left atrium: The atrium was mildly dilated. - Right ventricle: The cavity size was mildly dilated. Systolic   function was moderately reduced. - Right atrium: The atrium was mildly dilated. - Pulmonary arteries: PA peak pressure: 65 mm Hg (S).  Impressions:  - Poor acoustic windows limit study even with Definity use.   Assessment and Plan:  1. Chronic diastolic HF - exacerbated by obesity, sedentary lifestyle and dietary non compliance - he has actually done better with salt restriction and weight is down -  he is not sure he will be able to "stay on track" - encouragement given. BMET today. Overall prognosis pretty tenuous.   2. Longstanding persistent AF - not a candidate for [place] therapy - HR is ok.   3. HTN - BP is ok - no changes made today.   4. Obesity - will be an ongoing struggle for him.   5. OSA   6. COVID-19 Education: The signs and symptoms of COVID-19 were discussed with the patient and how to seek care for testing (follow up with PCP or arrange E-visit).  The importance of social distancing, staying at home, hand hygiene and wearing a mask when out in public were discussed today.  Current medicines are reviewed with the patient today.  The patient does not have concerns regarding medicines other than what has been noted above.  The following changes have been made:  See above.  Labs/ tests ordered today include:    Orders Placed This Encounter  Procedures  . Basic metabolic panel     Disposition:   FU with me in about 4 months.   Patient is agreeable to this plan and will call if any problems develop in the interim.   SignedTruitt Merle, NP  07/23/2019 3:19 PM  [facility] [address] [place], [place]  [address] Phone: [phone] Fax: [phone]

## 2019-07-16 ENCOUNTER — Other Ambulatory Visit: Payer: Self-pay

## 2019-07-23 ENCOUNTER — Encounter: Payer: Self-pay | Admitting: Nurse Practitioner

## 2019-07-23 ENCOUNTER — Ambulatory Visit (INDEPENDENT_AMBULATORY_CARE_PROVIDER_SITE_OTHER): Payer: PPO | Admitting: Nurse Practitioner

## 2019-07-23 ENCOUNTER — Other Ambulatory Visit: Payer: Self-pay

## 2019-07-23 VITALS — BP 118/78 | HR 97 | Ht 72.0 in | Wt 261.8 lb

## 2019-07-23 DIAGNOSIS — Z79899 Other long term (current) drug therapy: Secondary | ICD-10-CM | POA: Diagnosis not present

## 2019-07-23 DIAGNOSIS — I10 Essential (primary) hypertension: Secondary | ICD-10-CM

## 2019-07-23 DIAGNOSIS — I4821 Permanent atrial fibrillation: Secondary | ICD-10-CM | POA: Diagnosis not present

## 2019-07-23 DIAGNOSIS — I5032 Chronic diastolic (congestive) heart failure: Secondary | ICD-10-CM

## 2019-07-23 DIAGNOSIS — R0602 Shortness of breath: Secondary | ICD-10-CM | POA: Diagnosis not present

## 2019-07-23 NOTE — Patient Instructions (Addendum)
After Visit Summary:  We will be checking the following labs today - BMET   Medication Instructions:    Continue with your current medicines.    If you need a refill on your cardiac medications before your next appointment, please call your pharmacy.     Testing/Procedures To Be Arranged:  N/A  Follow-Up:   See me in about 4 months.     At [facility], you and your health needs are our priority.  As part of our continuing mission to provide you with exceptional heart care, we have created designated Provider Care Teams.  These Care Teams include your primary Cardiologist (physician) and Advanced Practice Providers (APPs -  Physician Assistants and Nurse Practitioners) who all work together to provide you with the care you need, when you need it.  Special Instructions:  . Stay safe, stay home, wash your hands for at least 20 seconds and wear a mask when out in public.  . It was good to talk with you today.     Call the [facility] office at [phone] if you have any questions, problems or concerns.       

## 2019-07-24 LAB — BASIC METABOLIC PANEL
BUN/Creatinine Ratio: 14 (ref 10–24)
BUN: 16 mg/dL (ref 8–27)
CO2: 26 mmol/L (ref 20–29)
Calcium: 9.6 mg/dL (ref 8.6–10.2)
Chloride: 100 mmol/L (ref 96–106)
Creatinine, Ser: 1.17 mg/dL (ref 0.76–1.27)
GFR calc Af Amer: 65 mL/min/{1.73_m2} (ref >59)
GFR calc non Af Amer: 56 mL/min/{1.73_m2} — ABNORMAL LOW (ref >59)
Glucose: 144 mg/dL — ABNORMAL HIGH (ref 65–99)
Potassium: 4 mmol/L (ref 3.5–5.2)
Sodium: 139 mmol/L (ref 134–144)

## 2019-08-11 ENCOUNTER — Other Ambulatory Visit: Payer: Self-pay | Admitting: Family Medicine

## 2019-08-11 MED ORDER — HYDROCODONE-ACETAMINOPHEN 10-325 MG PO TABS
ORAL_TABLET | ORAL | 0 refills | Status: DC
Start: 2019-08-11 — End: 2019-09-09

## 2019-08-11 NOTE — Telephone Encounter (Signed)
Wife came by requesting refill on his hydrocodone called into [place]. They are leaving early Wednesday morning to go to [place].   CB# [phone]

## 2019-08-11 NOTE — Telephone Encounter (Signed)
Patient is requesting a refill on Hydrocodone   LOV: 06/09/2019  LRF:   07/14/19

## 2019-09-09 ENCOUNTER — Ambulatory Visit (INDEPENDENT_AMBULATORY_CARE_PROVIDER_SITE_OTHER): Payer: PPO | Admitting: Family Medicine

## 2019-09-09 ENCOUNTER — Encounter: Payer: Self-pay | Admitting: Family Medicine

## 2019-09-09 ENCOUNTER — Other Ambulatory Visit: Payer: Self-pay

## 2019-09-09 VITALS — BP 130/70 | HR 70 | Temp 97.6°F | Resp 16 | Ht 73.0 in | Wt 263.0 lb

## 2019-09-09 DIAGNOSIS — I4819 Other persistent atrial fibrillation: Secondary | ICD-10-CM

## 2019-09-09 DIAGNOSIS — Z8719 Personal history of other diseases of the digestive system: Secondary | ICD-10-CM | POA: Diagnosis not present

## 2019-09-09 DIAGNOSIS — G8929 Other chronic pain: Secondary | ICD-10-CM

## 2019-09-09 DIAGNOSIS — M549 Dorsalgia, unspecified: Secondary | ICD-10-CM | POA: Diagnosis not present

## 2019-09-09 DIAGNOSIS — M1712 Unilateral primary osteoarthritis, left knee: Secondary | ICD-10-CM | POA: Diagnosis not present

## 2019-09-09 DIAGNOSIS — Z8673 Personal history of transient ischemic attack (TIA), and cerebral infarction without residual deficits: Secondary | ICD-10-CM

## 2019-09-09 MED ORDER — HYDROCODONE-ACETAMINOPHEN 10-325 MG PO TABS: ORAL_TABLET | ORAL | 0 refills | Status: DC

## 2019-09-09 NOTE — Progress Notes (Signed)
Subjective:    Patient ID: Bernard Ramirez, male    DOB: [birth year], 86 y.o.   MRN: [id number]  HPI  10/20 Patient is a very pleasant 86 year old male here today to establish care.  Patient was previously under the care of Dr. Sinda Du who has retired.  He is transferring his care to this office.  Patient's past medical history is significant for persistent atrial fibrillation.  However he is on no oral anticoagulant due to a history of a GI bleed.  This is well documented in his chart.  He is on Plavix however given history of a stroke in 2017.  MRI at that time revealed an acute left occipital infarct.  Patient also had a prostate biopsy performed in 2017 which revealed prostate adenocarcinoma with a Gleason score of 9.  Bone scan at that time revealed a possible increased uptake in the left sixth rib as well as in the right sternum.  However patient declined treatment and has not seen urology since that time.  Patient is also on hydrocodone/acetaminophen 10/325.  He takes 5 tablets a day.  He has been getting 150 tablets every month under the care of his primary care physician.  I reviewed the drug database and it is consistent that he has been receiving 150 tablets monthly from Dr. Sinda Du.  He is not receiving any other pain medication from any other doctor.  The patient is unable to tolerate NSAIDs for his chronic arthritic pain in his knees and in his lower back due to his history of GI bleeding.  Patient is asking if I will continue this for him.  I believe this is appropriate given his age and his frailty.  He does complain of severe lower back pain.  We discussed this today.  Pain is located roughly at the level of L5 in the center of the back.  I am concerned that the patient may have prostate metastasis to his lower back.  We discussed possibly repeating a bone scan to determine this however at the present time patient still declines treatment and does not want to proceed with a bone  scan.  He is asking if I will inject his left knee with cortisone.  His last cortisone injection was in June per his report.  He has been getting these occasionally from his PCP for arthritis in the left knee.  This provides some symptomatic relief.  Otherwise he is doing well.  He does have some mild memory loss and his wife accompanies him on today's exam to help provide additional history.  At that time, my plan was: Spent 30 minutes today with the patient.  I reviewed the national narcotic drug registry.  His report is consistent with the amount of pain medication he has been receiving.  Therefore I will continue to refill the patient's hydrocodone/acetaminophen 10/325 1 tablet every 4-6 hours as needed.  I will give him 150 tablets/month equating to 5 tablets/day.  Patient does have atrial fibrillation however he has been deemed inappropriate for oral anticoagulation due to a GI bleed.  Therefore he will continue Plavix.  He last had lab work drawn in September 28 and there was no evidence of anemia and he.denies any melena or hematochezia so I will recheck blood counts in 3 months with a CBC.  He does have a history of a CVA for which she is taking Plavix for secondary prevention of stroke.  I have recommended rechecking his cholesterol in 3 months.  His goal LDL cholesterol would be less than 70.  For his osteoarthritis in his left knee, using sterile technique, I injected the left knee with 2 cc lidocaine, 2 cc of Marcaine, and 2 cc of 40 mg/mL Kenalog.  Patient tolerated the procedure well without complication.  Regarding his prostate cancer I explained the results of his biopsy in 2017.  Also explained the results of his bone scan.  I have recommended repeating his bone scan given his persistent low back pain in his left knee pain to evaluate for any other evidence of metastasis which may change therapy.  Patient is still of the opinion not to treat his prostate cancer and denies the bone scan today which  I believe is reasonable.  Follow-up in 3 months.  09/09/19 Patient presents complaining of pain all over his body primarily in his right shoulder now and also in his left knee.  He is requesting a repeat cortisone injection in his left knee.  He has pain with ambulation.  He has pain with standing.  He has pain such as aching and throbbing that keeps him awake at night.  The cortisone shot provides some temporary relief.  His blood pressure today is normal at 130/70.  He denies any chest pain, shortness of breath, dyspnea on exertion however he is extremely sedentary.  He denies any melena or hematochezia.  He continues to take Plavix given his history of a CVA.  He has not witnessed evidence of bleeding on the Plavix.  He is not on anticoagulation despite being in atrial fibrillation due to his history of a GI bleed.  He is taking Protonix for gastric protection.  He denies any abdominal discomfort or dysphagia or odynophagia or heartburn. Past Medical History:  Diagnosis Date  . Anemia    04/2012: H&H-10.2/31.5, MCV-77; 06/2012:12/38.9  . Arthritis   . Benign prostatic hypertrophy    s/p transurethral resection of the prostate  . Chest pain    2004; with dyspnea, stress nuclear-normal EF + questionable inferior wall ischemia, normal coronary angiography;  2010-negative stress nuclear  . CVA (cerebral infarction)    asymptomatic (seen on CT)  . Diarrhea    h/o Hemoccult-positive stool  . Dysrhythmia   . Edema of both legs   . GERD (gastroesophageal reflux disease)   . History of blood transfusion   . History of kidney stones   . History of urinary tract infection   . Hypertension   . Jerking movements of extremities    left arm   . Low back pain   . Obstructive sleep apnea    pt states can not use the CPAP  . Peripheral neuropathy    lower legs and feet bilat   . Persistent atrial fibrillation ([place]) 2013   Asymptomatic; diagnosed in 05/2012  . Pneumonia   . Prostate cancer ([place]) 2017    stage 4  . Shortness of breath dyspnea    pt states only develops SOB if tries to do something too quickly  . Upper GI bleed 1985   1985-peptic ulcer disease; initial hemigastrectomy; subsequent Billroth II  . Urinary hesitancy   . Vertigo    ED evaluation-06/2012   Past Surgical History:  Procedure Laterality Date  . Billroth II    . CHOLECYSTECTOMY    . COLONOSCOPY  Approximately 2000  . COLONOSCOPY  10/2012   Colonoscopy March 2014 at [facility], difficulty exam requiring fluoroscopy and overtube. Patient developed severe bradycardia again during his procedure like he did  [facility] hospital. Multiple polyps removed which were tubular adenomas. Previously tattooed sites at 20 and 30 cm were free of any polypoid change. Left-sided diverticulosis noted.  . COLONOSCOPY WITH ESOPHAGOGASTRODUODENOSCOPY (EGD)  06-17-2009   CHY:IFOYDXAJ tubular adenomas and 2 tubulovillous adenomas, incomplete, 3 clips placed  . ESOPHAGOGASTRODUODENOSCOPY  06/17/2009   SLF: normal/chronic gastritis (non-h pylori)  . ESOPHAGOGASTRODUODENOSCOPY N/A 05/11/2015   OIN:OMVEHMC anemai due to postgastrectomy state/moderate erosive gastritis  . INGUINAL HERNIA REPAIR     Left  . ROTATOR CUFF REPAIR  1991   Bilateral  . TRANSURETHRAL RESECTION OF PROSTATE  06/2016  . TRANSURETHRAL RESECTION OF PROSTATE N/A 12/27/2012   Procedure: TRANSURETHRAL RESECTION OF THE PROSTATE (TURP);  Surgeon: Marissa Nestle, MD;  Location: [facility];  Service: Urology;  Laterality: N/A;  . TRANSURETHRAL RESECTION OF PROSTATE N/A 04/11/2016   Procedure: TRANSURETHRAL RESECTION OF THE PROSTATE (TURP);  Surgeon: Irine Seal, MD;  Location: [facility];  Service: Urology;  Laterality: N/A;  . VAGOTOMY AND PYLOROPLASTY  1970s   Details of procedure uncertain; 9470J   Current Outpatient Medications on File Prior to Visit  Medication Sig Dispense Refill  . clobetasol cream (TEMOVATE) 6.28 % Apply 1 application topically daily as needed (use as directed  for skin).     Marland Kitchen clopidogrel (PLAVIX) 75 MG tablet Take 75 mg by mouth daily.  0  . furosemide (LASIX) 40 MG tablet TAKE 1.5 TABLET BY MOUTH EVERY MORNING THEN 1 TABLET BY MOUTH EVERY AFTERNOON 225 tablet 1  . HYDROcodone-acetaminophen (NORCO) 10-325 MG tablet TAKE 1 TABLET  FIVE TIMES DAILY AS NEEDED FOR PAIN 150 tablet 0  . hydrOXYzine (ATARAX/VISTARIL) 50 MG tablet Take 1 tablet (50 mg total) by mouth every 6 (six) hours as needed for itching. 30 tablet 0  . losartan (COZAAR) 100 MG tablet Take 1 tablet by mouth daily  2  . meclizine (ANTIVERT) 25 MG tablet Take 25 mg by mouth at bedtime.     Marland Kitchen nystatin (MYCOSTATIN/NYSTOP) 100000 UNIT/GM POWD Apply 1 g topically daily as needed (rash).     . pantoprazole (PROTONIX) 40 MG tablet Take 40 mg by mouth daily.    . potassium chloride SA (K-DUR,KLOR-CON) 20 MEQ tablet Take 1 tablet (20 mEq total) by mouth daily. 45 tablet 6  . triamcinolone cream (KENALOG) 0.1 % Apply 1 application topically daily as needed (for irritation).      No current facility-administered medications on file prior to visit.   Allergies  Allergen Reactions  . Contrast Media [Iodinated Diagnostic Agents]     Pt states he was given "dye" 20 yrs ago, anaphylaxis & syncope - was told to never have it again   . Gabapentin Itching  . Aspirin Other (See Comments)    Blistering   . Latex Rash  . Lipitor [Atorvastatin] Itching  . Tape Rash    EKG leads   Social History   Socioeconomic History  . Marital status: Married    Spouse name: Not on file  . Number of children: 5  . Years of education: Not on file  . Highest education level: Not on file  Occupational History  . Occupation: Retired Pensions consultant: RETIRED  Tobacco Use  . Smoking status: Former Smoker    Years: 12.00    Types: Cigars    Quit date: 08/22/2003    Years since quitting: 16.0  . Smokeless tobacco: Never Used  . Tobacco comment: Quit many years ago; few cigars per day;  didn't inhale   Substance and Sexual Activity  . Alcohol use: No    Alcohol/week: 1.0 standard drinks    Types: 1 Standard drinks or equivalent per week  . Drug use: No  . Sexual activity: Not on file  Other Topics Concern  . Not on file  Social History Narrative   Lives w/ wife in [place]   Retired truck Geophysicist/field seismologist   Social Determinants of Radio broadcast assistant Strain:   . Difficulty of Paying Living Expenses: Not on file  Food Insecurity:   . Worried About Charity fundraiser in the Last Year: Not on file  . Ran Out of Food in the Last Year: Not on file  Transportation Needs:   . Lack of Transportation (Medical): Not on file  . Lack of Transportation (Non-Medical): Not on file  Physical Activity:   . Days of Exercise per Week: Not on file  . Minutes of Exercise per Session: Not on file  Stress:   . Feeling of Stress : Not on file  Social Connections:   . Frequency of Communication with Friends and Family: Not on file  . Frequency of Social Gatherings with Friends and Family: Not on file  . Attends Religious Services: Not on file  . Active Member of Clubs or Organizations: Not on file  . Attends Archivist Meetings: Not on file  . Marital Status: Not on file  Intimate Partner Violence:   . Fear of Current or Ex-Partner: Not on file  . Emotionally Abused: Not on file  . Physically Abused: Not on file  . Sexually Abused: Not on file      Review of Systems  All other systems reviewed and are negative.      Objective:   Physical Exam Vitals reviewed.  Constitutional:      General: He is not in acute distress.    Appearance: Normal appearance. He is obese. He is not ill-appearing or toxic-appearing.  Cardiovascular:     Rate and Rhythm: Normal rate. Rhythm irregular.     Heart sounds: Normal heart sounds. No murmur. No friction rub. No gallop.   Pulmonary:     Effort: Pulmonary effort is normal. No respiratory distress.     Breath sounds: Normal breath sounds.  No stridor. No wheezing, rhonchi or rales.  Abdominal:     General: Abdomen is flat. Bowel sounds are normal. There is no distension.     Palpations: Abdomen is soft. There is no mass.     Tenderness: There is no abdominal tenderness. There is no guarding.  Musculoskeletal:     Left knee: Swelling present. Decreased range of motion. Tenderness present over the medial joint line and lateral joint line.     Right lower leg: No edema.     Left lower leg: No edema.  Neurological:     General: No focal deficit present.     Mental Status: He is alert and oriented to person, place, and time.     Cranial Nerves: No cranial nerve deficit.     Sensory: No sensory deficit.     Motor: No weakness.     Coordination: Coordination normal.     Gait: Gait abnormal (Shuffling gait.  Requires a cane to ambulate).     Deep Tendon Reflexes: Reflexes normal.  Psychiatric:        Mood and Affect: Mood normal.        Thought Content: Thought content normal.  Judgment: Judgment normal.           Assessment & Plan:  Persistent atrial fibrillation ([place]) - Plan: CBC with Differential/Platelet, COMPLETE METABOLIC PANEL WITH GFR, Lipid panel  History of CVA (cerebrovascular accident) - Plan: CBC with Differential/Platelet, COMPLETE METABOLIC PANEL WITH GFR, Lipid panel  History of GI bleed - Plan: CBC with Differential/Platelet, COMPLETE METABOLIC PANEL WITH GFR, Lipid panel  Chronic midline back pain, unspecified back location  Primary osteoarthritis of left knee  Patient is currently in atrial fibrillation.  He is not anticoagulated due to history of GI bleeds however he is taking Plavix given history of stroke.  At the present time he denies any symptoms of gastritis or peptic ulcer disease.  He denies any melena.  He seems to be tolerating the Plavix well.  I will check a CBC to monitor for any anemia.  Also given his history of a CVA I will check a fasting lipid panel.  Ideally I like his LDL  cholesterol to be below 70.  I will refill the patient's pain medication that he takes 5 times a day.  However, per the patient's wishes, I injected his left knee with 2 cc lidocaine, 2 cc of Marcaine, and 2 cc of 40 mg/mL Kenalog using sterile technique.  The patient tolerated the procedure well without complication.

## 2019-09-10 ENCOUNTER — Other Ambulatory Visit: Payer: PPO

## 2019-09-10 DIAGNOSIS — Z8673 Personal history of transient ischemic attack (TIA), and cerebral infarction without residual deficits: Secondary | ICD-10-CM | POA: Diagnosis not present

## 2019-09-10 DIAGNOSIS — Z8719 Personal history of other diseases of the digestive system: Secondary | ICD-10-CM | POA: Diagnosis not present

## 2019-09-10 DIAGNOSIS — I4819 Other persistent atrial fibrillation: Secondary | ICD-10-CM | POA: Diagnosis not present

## 2019-09-11 ENCOUNTER — Other Ambulatory Visit: Payer: Self-pay

## 2019-09-11 LAB — CBC WITH DIFFERENTIAL/PLATELET
Absolute Monocytes: 563 cells/uL (ref 200–950)
Basophils Absolute: 18 cells/uL (ref 0–200)
Basophils Relative: 0.2 %
Eosinophils Absolute: 26 cells/uL (ref 15–500)
Eosinophils Relative: 0.3 %
HCT: 39.8 % (ref 38.5–50.0)
Hemoglobin: 12.8 g/dL — ABNORMAL LOW (ref 13.2–17.1)
Lymphs Abs: 924 cells/uL (ref 850–3900)
MCH: 27.9 pg (ref 27.0–33.0)
MCHC: 32.2 g/dL (ref 32.0–36.0)
MCV: 86.7 fL (ref 80.0–100.0)
MPV: 10.9 fL (ref 7.5–12.5)
Monocytes Relative: 6.4 %
Neutro Abs: 7269 cells/uL (ref 1500–7800)
Neutrophils Relative %: 82.6 %
Platelets: 217 10*3/uL (ref 140–400)
RBC: 4.59 10*6/uL (ref 4.20–5.80)
RDW: 14.1 % (ref 11.0–15.0)
Total Lymphocyte: 10.5 %
WBC: 8.8 10*3/uL (ref 3.8–10.8)

## 2019-09-11 LAB — LIPID PANEL
Cholesterol: 168 mg/dL (ref <200)
HDL: 46 mg/dL (ref >=40)
LDL Cholesterol (Calc): 101 mg/dL (calc) — ABNORMAL HIGH
Non-HDL Cholesterol (Calc): 122 mg/dL (calc) (ref <130)
Total CHOL/HDL Ratio: 3.7 (calc) (ref <5.0)
Triglycerides: 114 mg/dL (ref <150)

## 2019-09-11 LAB — COMPLETE METABOLIC PANEL WITH GFR
AG Ratio: 1.8 (calc) (ref 1.0–2.5)
ALT: 7 U/L — ABNORMAL LOW (ref 9–46)
AST: 14 U/L (ref 10–35)
Albumin: 4.2 g/dL (ref 3.6–5.1)
Alkaline phosphatase (APISO): 78 U/L (ref 35–144)
BUN: 18 mg/dL (ref 7–25)
CO2: 26 mmol/L (ref 20–32)
Calcium: 9.6 mg/dL (ref 8.6–10.3)
Chloride: 102 mmol/L (ref 98–110)
Creat: 1.06 mg/dL (ref 0.70–1.11)
GFR, Est African American: 73 mL/min/{1.73_m2} (ref >=60)
GFR, Est Non African American: 63 mL/min/{1.73_m2} (ref >=60)
Globulin: 2.4 g/dL (calc) (ref 1.9–3.7)
Glucose, Bld: 143 mg/dL — ABNORMAL HIGH (ref 65–99)
Potassium: 5 mmol/L (ref 3.5–5.3)
Sodium: 140 mmol/L (ref 135–146)
Total Bilirubin: 0.9 mg/dL (ref 0.2–1.2)
Total Protein: 6.6 g/dL (ref 6.1–8.1)

## 2019-09-11 MED ORDER — PRAVASTATIN SODIUM 20 MG PO TABS: 20.0000 mg | ORAL_TABLET | Freq: Every day | ORAL | 0 refills | Status: DC

## 2019-09-23 ENCOUNTER — Telehealth: Payer: Self-pay | Admitting: Family Medicine

## 2019-09-23 NOTE — Telephone Encounter (Signed)
Wife stopped by states that when she made an appt to get the vaccines she was told that her husband would need a letter stating it was okay for him to get the shot.  CB# [phone]

## 2019-09-25 NOTE — Telephone Encounter (Signed)
Pt aware, letter written and signed. Pt's wife picked up letter

## 2019-10-10 ENCOUNTER — Other Ambulatory Visit: Payer: Self-pay

## 2019-10-10 MED ORDER — HYDROCODONE-ACETAMINOPHEN 10-325 MG PO TABS: ORAL_TABLET | ORAL | 0 refills | Status: DC

## 2019-10-10 NOTE — Telephone Encounter (Signed)
Requested Prescriptions   Pending Prescriptions Disp Refills  . HYDROcodone-acetaminophen (NORCO) 10-325 MG tablet 150 tablet 0    Sig: TAKE 1 TABLET  FIVE TIMES DAILY AS NEEDED FOR PAIN     Last OV 09/09/2019   Last written 09/09/2019

## 2019-10-24 NOTE — Progress Notes (Signed)
CARDIOLOGY OFFICE NOTE  Date:  10/29/2019    [name] Date of Birth: Nov 14, 1930 Medical Record #[id number]  PCP:  [name], MD  Cardiologist:  [name] Allred  Chief Complaint  Patient presents with  . Follow-up    Seen for Dr. [name]    History of Present Illness: Bernard Ramirez is a 86 y.o. male who presents today for a 3 month check. Seen for Dr. [name].   He has a history of persistent AFib, not on anticoagulation due to prior GI bleed/anemia. Other issues include prior CVA, OSA intolerant to CPAP, prostate cancer, HTN, chronic diastolic HF and mild AS. He has had issues with chronic back pain and is quite sedentary. He has had intermittent issues with swelling/DOE and required additional diuretics. Noted he likes salt.   Last seen in October by Dr. [name]. Having more DOE, edema and chest pain. Weight was up 9 pounds. Dr. [name] noted that there was not much left for [place] to do for him - he has considerable dietary indiscretion, sedentary lifestyle, etc. Myoview was updated and was reassuring.  I then saw him back in early December - he was actually losing weight, restricting salt but still pretty sedentary.   The patient does not have symptoms concerning for COVID-19 infection (fever, chills, cough, or new shortness of breath).   Comes in today. Here with his wife. He says he is doing ok. May feel a bit better. Still pretty sedentary from chronic back issues. Has had both COVID vaccines. No chest pain. Breathing is stable. Swelling is stable. Weight is down here and at home. Trying to restrict his salt as best he can. He has changed primary care and will be seeing him next month.   Past Medical History:  Diagnosis Date  . Anemia    04/2012: H&H-10.2/31.5, MCV-77; 06/2012:12/38.9  . Arthritis   . Benign prostatic hypertrophy    s/p transurethral resection of the prostate  . Chest pain    2004; with dyspnea, stress nuclear-normal EF + questionable  inferior wall ischemia, normal coronary angiography;  2010-negative stress nuclear  . CVA (cerebral infarction)    asymptomatic (seen on CT)  . Diarrhea    h/o Hemoccult-positive stool  . Dysrhythmia   . Edema of both legs   . GERD (gastroesophageal reflux disease)   . History of blood transfusion   . History of kidney stones   . History of urinary tract infection   . Hypertension   . Jerking movements of extremities    left arm   . Low back pain   . Obstructive sleep apnea    pt states can not use the CPAP  . Peripheral neuropathy    lower legs and feet bilat   . Persistent atrial fibrillation ([place]) 2013   Asymptomatic; diagnosed in 05/2012  . Pneumonia   . Prostate cancer ([place]) 2017   stage 4  . Shortness of breath dyspnea    pt states only develops SOB if tries to do something too quickly  . Upper GI bleed 1985   1985-peptic ulcer disease; initial hemigastrectomy; subsequent Billroth II  . Urinary hesitancy   . Vertigo    ED evaluation-06/2012    Past Surgical History:  Procedure Laterality Date  . Billroth II    . CHOLECYSTECTOMY    . COLONOSCOPY  Approximately 2000  . COLONOSCOPY  10/2012   Colonoscopy March 2014 at [facility], difficulty exam requiring fluoroscopy and overtube. Patient developed  severe bradycardia again during his procedure like he did [facility]. Multiple polyps removed which were tubular adenomas. Previously tattooed sites at 20 and 30 cm were free of any polypoid change. Left-sided diverticulosis noted.  . COLONOSCOPY WITH ESOPHAGOGASTRODUODENOSCOPY (EGD)  06-17-2009   PTW:SFKCLEXN tubular adenomas and 2 tubulovillous adenomas, incomplete, 3 clips placed  . ESOPHAGOGASTRODUODENOSCOPY  06/17/2009   SLF: normal/chronic gastritis (non-h pylori)  . ESOPHAGOGASTRODUODENOSCOPY N/A 05/11/2015   TZG:YFVCBSW anemai due to postgastrectomy state/moderate erosive gastritis  . INGUINAL HERNIA REPAIR     Left  . ROTATOR CUFF REPAIR  1991   Bilateral  .  TRANSURETHRAL RESECTION OF PROSTATE  06/2016  . TRANSURETHRAL RESECTION OF PROSTATE N/A 12/27/2012   Procedure: TRANSURETHRAL RESECTION OF THE PROSTATE (TURP);  Surgeon: Marissa Nestle, MD;  Location: [facility];  Service: Urology;  Laterality: N/A;  . TRANSURETHRAL RESECTION OF PROSTATE N/A 04/11/2016   Procedure: TRANSURETHRAL RESECTION OF THE PROSTATE (TURP);  Surgeon: Irine Seal, MD;  Location: [facility];  Service: Urology;  Laterality: N/A;  . VAGOTOMY AND PYLOROPLASTY  1970s   Details of procedure uncertain; 9675F     Medications: Current Meds  Medication Sig  . clobetasol cream (TEMOVATE) 1.63 % Apply 1 application topically daily as needed (use as directed for skin).   Marland Kitchen clopidogrel (PLAVIX) 75 MG tablet Take 75 mg by mouth daily.  . furosemide (LASIX) 40 MG tablet TAKE 1.5 TABLET BY MOUTH EVERY MORNING THEN 1 TABLET BY MOUTH EVERY AFTERNOON  . HYDROcodone-acetaminophen (NORCO) 10-325 MG tablet TAKE 1 TABLET  FIVE TIMES DAILY AS NEEDED FOR PAIN  . hydrOXYzine (ATARAX/VISTARIL) 50 MG tablet Take 1 tablet (50 mg total) by mouth every 6 (six) hours as needed for itching.  . losartan (COZAAR) 100 MG tablet Take 1 tablet by mouth daily  . meclizine (ANTIVERT) 25 MG tablet Take 25 mg by mouth at bedtime.   Marland Kitchen nystatin (MYCOSTATIN/NYSTOP) 100000 UNIT/GM POWD Apply 1 g topically daily as needed (rash).   . pantoprazole (PROTONIX) 40 MG tablet Take 40 mg by mouth daily.  . potassium chloride SA (K-DUR,KLOR-CON) 20 MEQ tablet Take 1 tablet (20 mEq total) by mouth daily.  . pravastatin (PRAVACHOL) 20 MG tablet Take 1 tablet (20 mg total) by mouth daily.  Marland Kitchen triamcinolone cream (KENALOG) 0.1 % Apply 1 application topically daily as needed (for irritation).      Allergies: Allergies  Allergen Reactions  . Contrast Media [Iodinated Diagnostic Agents]     Pt states he was given "dye" 20 yrs ago, anaphylaxis & syncope - was told to never have it again   . Gabapentin Itching  . Aspirin Other (See  Comments)    Blistering   . Latex Rash  . Lipitor [Atorvastatin] Itching  . Tape Rash    EKG leads    Social History: The patient  reports that he quit smoking about 16 years ago. His smoking use included cigars. He quit after 12.00 years of use. He has never used smokeless tobacco. He reports that he does not drink alcohol or use drugs.   Family History: The patient's family history includes Allergic rhinitis in his father and mother; Diabetes Mellitus II in his mother; Lung disease in his father.   Review of Systems: Please see the history of present illness.   All other systems are reviewed and negative.   Physical Exam: VS:  BP 108/64   Pulse 81   Ht 6' (1.829 m)   Wt 259 lb (117.5 kg)  SpO2 96%   BMI 35.13 kg/m  .  BMI Body mass index is 35.13 kg/m.  Wt Readings from Last 3 Encounters:  10/29/19 259 lb (117.5 kg)  09/09/19 263 lb (119.3 kg)  07/23/19 261 lb 12.8 oz (118.8 kg)    General: Pleasant. Elderly. Alert and in no acute distress.   Cardiac: Irregular irregular rhythm. Rate is ok. +1 bilateral edema.   Respiratory:  Lungs are fairly clear to auscultation bilaterally with normal work of breathing.  GI: Soft and nontender.  MS: No deformity or atrophy. Gait not tested - he is in a wheelchair.  Skin: Warm and dry. Color is normal.  Neuro:  Strength and sensation are intact and no gross focal deficits noted.  Psych: Alert, appropriate and with normal affect.   LABORATORY DATA:  EKG:  EKG is not ordered today.   Lab Results  Component Value Date   WBC 8.8 09/10/2019   HGB 12.8 (L) 09/10/2019   HCT 39.8 09/10/2019   PLT 217 09/10/2019   GLUCOSE 143 (H) 09/10/2019   CHOL 168 09/10/2019   TRIG 114 09/10/2019   HDL 46 09/10/2019   LDLCALC 101 (H) 09/10/2019   ALT 7 (L) 09/10/2019   AST 14 09/10/2019   NA 140 09/10/2019   K 5.0 09/10/2019   CL 102 09/10/2019   CREATININE 1.06 09/10/2019   BUN 18 09/10/2019   CO2 26 09/10/2019   TSH 2.995 Test  methodology is 3rd generation TSH 06/17/2009   INR 1.19 05/03/2015     BNP (last 3 results) No results for input(s): BNP in the last 8760 hours.  ProBNP (last 3 results) Recent Labs    05/19/19 1618  PROBNP 340     Other Studies Reviewed Today:  Myoview Study Highlights 05/2019    Nuclear stress EF: 58%.  The study is normal.  This is a low risk study.  The left ventricular ejection fraction is normal (55-65%).  Normal resting and stress perfusion. No ischemia or infarction EF 58%    Echo Study Conclusions 11/2017  - Left ventricle: The cavity size was normal. Wall thickness was increased in a pattern of mild LVH. Systolic function was normal. The estimated ejection fraction was in the range of 50% to 55%. - Mitral valve: There was mild regurgitation. - Left atrium: The atrium was mildly dilated. - Right ventricle: The cavity size was mildly dilated. Systolic function was moderately reduced. - Right atrium: The atrium was mildly dilated. - Pulmonary arteries: PA peak pressure: 65 mm Hg (S).  Impressions:  - Poor acoustic windows limit study even with Definity use.   Assessment and Plan:  1. Chronic diastolic HF - exacerbated by obesity, sedentary lifestyle and dietary issues - he is doing better - weight continues to go down a bit. Symptoms seemed to have improved some. Still overall prognosis tenuous to me but seems to be holding his own for now.   2. Persistent AF - longstanding - rate is controlled - he is not a candidate for [facility] (has had prior GI bleed) but is on Plavix for prior CVA. He reports one fall since he was last here - no significant injury.   3. HTN - BP is ok today - no changes made.   4. Obesity - will be an ongoing struggle  5. OSA  6.  COVID-19 Education: The signs and symptoms of COVID-19 were discussed with the patient and how to seek care for testing (follow up with PCP or arrange  E-visit).  The importance of social  distancing, staying at home, hand hygiene and wearing a mask when out in public were discussed today. He has had both vaccines.   Current medicines are reviewed with the patient today.  The patient does not have concerns regarding medicines other than what has been noted above.  The following changes have been made:  See above.  Labs/ tests ordered today include:   No orders of the defined types were placed in this encounter.    Disposition:   FU with [place] in about 6 months.   Patient is agreeable to this plan and will call if any problems develop in the interim.   SignedTruitt Merle, NP  10/29/2019 3:03 PM  [facility] [address] [place], [place]  [address] Phone: [phone] Fax: [phone]

## 2019-10-29 ENCOUNTER — Other Ambulatory Visit: Payer: Self-pay

## 2019-10-29 ENCOUNTER — Encounter: Payer: Self-pay | Admitting: Nurse Practitioner

## 2019-10-29 ENCOUNTER — Ambulatory Visit (INDEPENDENT_AMBULATORY_CARE_PROVIDER_SITE_OTHER): Payer: PPO | Admitting: Nurse Practitioner

## 2019-10-29 VITALS — BP 108/64 | HR 81 | Ht 72.0 in | Wt 259.0 lb

## 2019-10-29 DIAGNOSIS — R0602 Shortness of breath: Secondary | ICD-10-CM

## 2019-10-29 DIAGNOSIS — Z7189 Other specified counseling: Secondary | ICD-10-CM

## 2019-10-29 DIAGNOSIS — I10 Essential (primary) hypertension: Secondary | ICD-10-CM

## 2019-10-29 DIAGNOSIS — Z79899 Other long term (current) drug therapy: Secondary | ICD-10-CM | POA: Diagnosis not present

## 2019-10-29 DIAGNOSIS — I4821 Permanent atrial fibrillation: Secondary | ICD-10-CM | POA: Diagnosis not present

## 2019-10-29 DIAGNOSIS — I5032 Chronic diastolic (congestive) heart failure: Secondary | ICD-10-CM | POA: Diagnosis not present

## 2019-10-29 NOTE — Patient Instructions (Addendum)
After Visit Summary:  We will be checking the following labs today - NONE   Medication Instructions:    Continue with your current medicines.    If you need a refill on your cardiac medications before your next appointment, please call your pharmacy.     Testing/Procedures To Be Arranged:  N/A  Follow-Up:   See me in 6 months: APPOINTMENT ON 05/10/20 AT 2:45 PM     At [facility], you and your health needs are our priority.  As part of our continuing mission to provide you with exceptional heart care, we have created designated Provider Care Teams.  These Care Teams include your primary Cardiologist (physician) and Advanced Practice Providers (APPs -  Physician Assistants and Nurse Practitioners) who all work together to provide you with the care you need, when you need it.  Special Instructions:  . Stay safe, stay home, wash your hands for at least 20 seconds and wear a mask when out in public.  . It was good to talk with you today.    Call the [place] office at [phone] if you have any questions, problems or concerns.

## 2019-11-07 ENCOUNTER — Other Ambulatory Visit: Payer: Self-pay | Admitting: Family Medicine

## 2019-11-07 MED ORDER — HYDROCODONE-ACETAMINOPHEN 10-325 MG PO TABS: ORAL_TABLET | ORAL | 0 refills | Status: DC

## 2019-11-07 NOTE — Telephone Encounter (Signed)
Last refilled: 10/10/2019 Last office visit: 09/09/2019

## 2019-11-07 NOTE — Telephone Encounter (Addendum)
[facility] [place]  Patient needs refill on hydrocodone and clopidogrel

## 2019-11-10 ENCOUNTER — Other Ambulatory Visit: Payer: Self-pay | Admitting: Family Medicine

## 2019-11-10 MED ORDER — PANTOPRAZOLE SODIUM 40 MG PO TBEC: 40.0000 mg | DELAYED_RELEASE_TABLET | Freq: Every day | ORAL | 2 refills | Status: DC

## 2019-11-11 ENCOUNTER — Other Ambulatory Visit: Payer: Self-pay | Admitting: Family Medicine

## 2019-11-11 MED ORDER — CLOPIDOGREL BISULFATE 75 MG PO TABS: 75.0000 mg | ORAL_TABLET | Freq: Every day | ORAL | 3 refills | Status: DC

## 2019-11-16 ENCOUNTER — Other Ambulatory Visit: Payer: Self-pay | Admitting: Cardiology

## 2019-11-17 ENCOUNTER — Other Ambulatory Visit: Payer: Self-pay | Admitting: *Deleted

## 2019-11-17 MED ORDER — LOSARTAN POTASSIUM 100 MG PO TABS: 100.0000 mg | ORAL_TABLET | Freq: Every day | ORAL | 2 refills | Status: DC

## 2019-11-17 NOTE — Telephone Encounter (Signed)
Fine to refill but just saw Marcy Salvo, NP I did not see that she stopped.

## 2019-11-22 ENCOUNTER — Other Ambulatory Visit: Payer: Self-pay | Admitting: Family Medicine

## 2019-11-26 ENCOUNTER — Other Ambulatory Visit: Payer: Self-pay | Admitting: Family Medicine

## 2019-11-26 MED ORDER — FUROSEMIDE 40 MG PO TABS: ORAL_TABLET | ORAL | 1 refills | Status: DC

## 2019-12-09 ENCOUNTER — Ambulatory Visit (INDEPENDENT_AMBULATORY_CARE_PROVIDER_SITE_OTHER): Payer: PPO | Admitting: Family Medicine

## 2019-12-09 ENCOUNTER — Other Ambulatory Visit: Payer: Self-pay

## 2019-12-09 VITALS — BP 98/60 | HR 74 | Temp 96.8°F | Resp 18 | Ht 73.0 in | Wt 260.0 lb

## 2019-12-09 DIAGNOSIS — I5020 Unspecified systolic (congestive) heart failure: Secondary | ICD-10-CM

## 2019-12-09 DIAGNOSIS — Z8673 Personal history of transient ischemic attack (TIA), and cerebral infarction without residual deficits: Secondary | ICD-10-CM | POA: Diagnosis not present

## 2019-12-09 DIAGNOSIS — I4819 Other persistent atrial fibrillation: Secondary | ICD-10-CM | POA: Diagnosis not present

## 2019-12-09 MED ORDER — HYDROCODONE-ACETAMINOPHEN 10-325 MG PO TABS: ORAL_TABLET | ORAL | 0 refills | Status: DC

## 2019-12-09 NOTE — Progress Notes (Signed)
Subjective:    Patient ID: Bernard Ramirez, male    DOB: Jan 13, 1931, 86 y.o.   MRN: [id number]  HPI  10/20 Patient is a very pleasant 86 year old male here today to establish care.  Patient was previously under the care of Dr. Sinda Du who has retired.  He is transferring his care to this office.  Patient's past medical history is significant for persistent atrial fibrillation.  However he is on no oral anticoagulant due to a history of a GI bleed.  This is well documented in his chart.  He is on Plavix however given history of a stroke in 2017.  MRI at that time revealed an acute left occipital infarct.  Patient also had a prostate biopsy performed in 2017 which revealed prostate adenocarcinoma with a Gleason score of 9.  Bone scan at that time revealed a possible increased uptake in the left sixth rib as well as in the right sternum.  However patient declined treatment and has not seen urology since that time.  Patient is also on hydrocodone/acetaminophen 10/325.  He takes 5 tablets a day.  He has been getting 150 tablets every month under the care of his primary care physician.  I reviewed the drug database and it is consistent that he has been receiving 150 tablets monthly from Dr. Sinda Du.  He is not receiving any other pain medication from any other doctor.  The patient is unable to tolerate NSAIDs for his chronic arthritic pain in his knees and in his lower back due to his history of GI bleeding.  Patient is asking if I will continue this for him.  I believe this is appropriate given his age and his frailty.  He does complain of severe lower back pain.  We discussed this today.  Pain is located roughly at the level of L5 in the center of the back.  I am concerned that the patient may have prostate metastasis to his lower back.  We discussed possibly repeating a bone scan to determine this however at the present time patient still declines treatment and does not want to proceed with a bone  scan.  He is asking if I will inject his left knee with cortisone.  His last cortisone injection was in June per his report.  He has been getting these occasionally from his PCP for arthritis in the left knee.  This provides some symptomatic relief.  Otherwise he is doing well.  He does have some mild memory loss and his wife accompanies him on today's exam to help provide additional history.  At that time, my plan was: Spent 30 minutes today with the patient.  I reviewed the national narcotic drug registry.  His report is consistent with the amount of pain medication he has been receiving.  Therefore I will continue to refill the patient's hydrocodone/acetaminophen 10/325 1 tablet every 4-6 hours as needed.  I will give him 150 tablets/month equating to 5 tablets/day.  Patient does have atrial fibrillation however he has been deemed inappropriate for oral anticoagulation due to a GI bleed.  Therefore he will continue Plavix.  He last had lab work drawn in September 28 and there was no evidence of anemia and he.denies any melena or hematochezia so I will recheck blood counts in 3 months with a CBC.  He does have a history of a CVA for which she is taking Plavix for secondary prevention of stroke.  I have recommended rechecking his cholesterol in 3 months.  His goal LDL cholesterol would be less than 70.  For his osteoarthritis in his left knee, using sterile technique, I injected the left knee with 2 cc lidocaine, 2 cc of Marcaine, and 2 cc of 40 mg/mL Kenalog.  Patient tolerated the procedure well without complication.  Regarding his prostate cancer I explained the results of his biopsy in 2017.  Also explained the results of his bone scan.  I have recommended repeating his bone scan given his persistent low back pain in his left knee pain to evaluate for any other evidence of metastasis which may change therapy.  Patient is still of the opinion not to treat his prostate cancer and denies the bone scan today which  I believe is reasonable.  Follow-up in 3 months.  09/09/19 Patient presents complaining of pain all over his body primarily in his right shoulder now and also in his left knee.  He is requesting a repeat cortisone injection in his left knee.  He has pain with ambulation.  He has pain with standing.  He has pain such as aching and throbbing that keeps him awake at night.  The cortisone shot provides some temporary relief.  His blood pressure today is normal at 130/70.  He denies any chest pain, shortness of breath, dyspnea on exertion however he is extremely sedentary.  He denies any melena or hematochezia.  He continues to take Plavix given his history of a CVA.  He has not witnessed evidence of bleeding on the Plavix.  He is not on anticoagulation despite being in atrial fibrillation due to his history of a GI bleed.  He is taking Protonix for gastric protection.  He denies any abdominal discomfort or dysphagia or odynophagia or heartburn.  12/09/19 Patient has +1 pitting edema in both legs up to his knees.  This is markedly increased since last time I seen him.  He is taking Lasix 1/2 tablets in the morning and 1 tablet in the evening.  He hesitates to take anymore due to polyuria which keeps him from being able to leave his home due to having to go to the bathroom.  He denies any GI bleed although he does have a skin tear over the right lateral malleolus from a recent fall.  The skin tear is approximately 2.5 cm in diameter.  There is bruising around it.  I covered this with a nonadherent gauze as well as Covan and discussed dressing changes.  He does report dyspnea on exertion.  His wife states that he breathes very heavy with minimal activity.  He denies any dyspnea at rest.  He denies any orthopnea.  He denies any syncope or near syncope or palpitations despite being in A. fib.  He is currently only on Plavix due to his history of GI bleed.  He does have numerous senile purpura on the dorsums of both  forearms.  Currently he is taking 150 tablets of hydrocodone per month.  This is helping to manage his chronic pain.  There is no evidence of abuse or diversion. Past Medical History:  Diagnosis Date  . Anemia    04/2012: H&H-10.2/31.5, MCV-77; 06/2012:12/38.9  . Arthritis   . Benign prostatic hypertrophy    s/p transurethral resection of the prostate  . Chest pain    2004; with dyspnea, stress nuclear-normal EF + questionable inferior wall ischemia, normal coronary angiography;  2010-negative stress nuclear  . CVA (cerebral infarction)    asymptomatic (seen on CT)  . Diarrhea    h/o  Hemoccult-positive stool  . Dysrhythmia   . Edema of both legs   . GERD (gastroesophageal reflux disease)   . History of blood transfusion   . History of kidney stones   . History of urinary tract infection   . Hypertension   . Jerking movements of extremities    left arm   . Low back pain   . Obstructive sleep apnea    pt states can not use the CPAP  . Peripheral neuropathy    lower legs and feet bilat   . Persistent atrial fibrillation ([place]) 2013   Asymptomatic; diagnosed in 05/2012  . Pneumonia   . Prostate cancer ([place]) 2017   stage 4  . Shortness of breath dyspnea    pt states only develops SOB if tries to do something too quickly  . Upper GI bleed 1985   1985-peptic ulcer disease; initial hemigastrectomy; subsequent Billroth II  . Urinary hesitancy   . Vertigo    ED evaluation-06/2012   Past Surgical History:  Procedure Laterality Date  . Billroth II    . CHOLECYSTECTOMY    . COLONOSCOPY  Approximately 2000  . COLONOSCOPY  10/2012   Colonoscopy March 2014 at [facility], difficulty exam requiring fluoroscopy and overtube. Patient developed severe bradycardia again during his procedure like he did [facility]. Multiple polyps removed which were tubular adenomas. Previously tattooed sites at 20 and 30 cm were free of any polypoid change. Left-sided diverticulosis noted.  . COLONOSCOPY  WITH ESOPHAGOGASTRODUODENOSCOPY (EGD)  06-17-2009   ALP:[id number] tubular adenomas and 2 tubulovillous adenomas, incomplete, 3 clips placed  . ESOPHAGOGASTRODUODENOSCOPY  06/17/2009   SLF: normal/chronic gastritis (non-h pylori)  . ESOPHAGOGASTRODUODENOSCOPY N/A 05/11/2015   BDZ:[id number] anemai due to postgastrectomy state/moderate erosive gastritis  . INGUINAL HERNIA REPAIR     Left  . ROTATOR CUFF REPAIR  1991   Bilateral  . TRANSURETHRAL RESECTION OF PROSTATE  06/2016  . TRANSURETHRAL RESECTION OF PROSTATE N/A 12/27/2012   Procedure: TRANSURETHRAL RESECTION OF THE PROSTATE (TURP);  Surgeon: Marissa Nestle, MD;  Location: [facility];  Service: Urology;  Laterality: N/A;  . TRANSURETHRAL RESECTION OF PROSTATE N/A 04/11/2016   Procedure: TRANSURETHRAL RESECTION OF THE PROSTATE (TURP);  Surgeon: Irine Seal, MD;  Location: [facility];  Service: Urology;  Laterality: N/A;  . VAGOTOMY AND PYLOROPLASTY  1970s   Details of procedure uncertain; 6834H   Current Outpatient Medications on File Prior to Visit  Medication Sig Dispense Refill  . clobetasol cream (TEMOVATE) 9.62 % Apply 1 application topically daily as needed (use as directed for skin).     Marland Kitchen clopidogrel (PLAVIX) 75 MG tablet Take 1 tablet (75 mg total) by mouth daily. 90 tablet 3  . furosemide (LASIX) 40 MG tablet TAKE 1.5 TABLET BY MOUTH EVERY MORNING THEN 1 TABLET BY MOUTH EVERY AFTERNOON 225 tablet 1  . hydrOXYzine (ATARAX/VISTARIL) 50 MG tablet Take 1 tablet (50 mg total) by mouth every 6 (six) hours as needed for itching. 30 tablet 0  . losartan (COZAAR) 100 MG tablet Take 1 tablet (100 mg total) by mouth daily. 90 tablet 2  . meclizine (ANTIVERT) 25 MG tablet Take 25 mg by mouth at bedtime.     Marland Kitchen nystatin (MYCOSTATIN/NYSTOP) 100000 UNIT/GM POWD Apply 1 g topically daily as needed (rash).     . pantoprazole (PROTONIX) 40 MG tablet Take 1 tablet (40 mg total) by mouth daily. 90 tablet 2  . potassium chloride SA (K-DUR,KLOR-CON) 20 MEQ tablet  Take 1 tablet (20  mEq total) by mouth daily. 45 tablet 6  . pravastatin (PRAVACHOL) 20 MG tablet TAKE 1 TABLET(20 MG) BY MOUTH DAILY 90 tablet 2  . triamcinolone cream (KENALOG) 0.1 % Apply 1 application topically daily as needed (for irritation).      No current facility-administered medications on file prior to visit.   Allergies  Allergen Reactions  . Contrast Media [Iodinated Diagnostic Agents]     Pt states he was given "dye" 20 yrs ago, anaphylaxis & syncope - was told to never have it again   . Gabapentin Itching  . Aspirin Other (See Comments)    Blistering   . Latex Rash  . Lipitor [Atorvastatin] Itching  . Tape Rash    EKG leads   Social History   Socioeconomic History  . Marital status: Married    Spouse name: Not on file  . Number of children: 5  . Years of education: Not on file  . Highest education level: Not on file  Occupational History  . Occupation: Retired Pensions consultant: RETIRED  Tobacco Use  . Smoking status: Former Smoker    Years: 12.00    Types: Cigars    Quit date: 08/22/2003    Years since quitting: 16.3  . Smokeless tobacco: Never Used  . Tobacco comment: Quit many years ago; few cigars per day; didn't inhale  Substance and Sexual Activity  . Alcohol use: No    Alcohol/week: 1.0 standard drinks    Types: 1 Standard drinks or equivalent per week  . Drug use: No  . Sexual activity: Not on file  Other Topics Concern  . Not on file  Social History Narrative   Lives w/ wife in [place]   Retired truck Geophysicist/field seismologist   Social Determinants of Radio broadcast assistant Strain:   . Difficulty of Paying Living Expenses:   Food Insecurity:   . Worried About Charity fundraiser in the Last Year:   . Arboriculturist in the Last Year:   Transportation Needs:   . Film/video editor (Medical):   Marland Kitchen Lack of Transportation (Non-Medical):   Physical Activity:   . Days of Exercise per Week:   . Minutes of Exercise per Session:     Stress:   . Feeling of Stress :   Social Connections:   . Frequency of Communication with Friends and Family:   . Frequency of Social Gatherings with Friends and Family:   . Attends Religious Services:   . Active Member of Clubs or Organizations:   . Attends Archivist Meetings:   Marland Kitchen Marital Status:   Intimate Partner Violence:   . Fear of Current or Ex-Partner:   . Emotionally Abused:   Marland Kitchen Physically Abused:   . Sexually Abused:       Review of Systems  All other systems reviewed and are negative.      Objective:   Physical Exam Vitals reviewed.  Constitutional:      General: He is not in acute distress.    Appearance: Normal appearance. He is obese. He is not ill-appearing or toxic-appearing.  Cardiovascular:     Rate and Rhythm: Normal rate. Rhythm irregular.     Heart sounds: Normal heart sounds. No murmur. No friction rub. No gallop.   Pulmonary:     Effort: Pulmonary effort is normal. No respiratory distress.     Breath sounds: Normal breath sounds. No stridor. No wheezing, rhonchi or rales.  Abdominal:  General: Abdomen is flat. Bowel sounds are normal. There is no distension.     Palpations: Abdomen is soft. There is no mass.     Tenderness: There is no abdominal tenderness. There is no guarding.  Musculoskeletal:     Left knee: Swelling present. Decreased range of motion. Tenderness present over the medial joint line and lateral joint line.     Right lower leg: No edema.     Left lower leg: No edema.  Neurological:     General: No focal deficit present.     Mental Status: He is alert and oriented to person, place, and time.     Cranial Nerves: No cranial nerve deficit.     Sensory: No sensory deficit.     Motor: No weakness.     Coordination: Coordination normal.     Gait: Gait abnormal (Shuffling gait.  Requires a cane to ambulate).     Deep Tendon Reflexes: Reflexes normal.  Psychiatric:        Mood and Affect: Mood normal.        Thought  Content: Thought content normal.        Judgment: Judgment normal.           Assessment & Plan:  Persistent atrial fibrillation ([place]) - Plan: CBC with Differential/Platelet, COMPLETE METABOLIC PANEL WITH GFR  History of CVA (cerebrovascular accident) - Plan: CBC with Differential/Platelet, COMPLETE METABOLIC PANEL WITH GFR  Systolic congestive heart failure, unspecified HF chronicity ([place])  Patient is currently in atrial fibrillation.  He is not anticoagulated due to history of GI bleeds however he is taking Plavix given history of stroke.  At the present time he denies any symptoms of gastritis or peptic ulcer disease.  He denies any melena.  He seems to be tolerating the Plavix well.  I will check a CBC to monitor for any anemia.  Patient had a cookie prior to his appointment today and therefore cannot check his fasting lipid panel.  Given his history of congestive heart failure and his edema on exam, I have recommended increasing his Lasix to 2 tablets in the morning and 1 tablet in the evening.  Ideally I like him on 2 tablets twice a day however he is afraid that he is unable to tolerate that.  He will call me back Friday with an update to let me know how the increased dose of diuretic is working.

## 2019-12-16 ENCOUNTER — Other Ambulatory Visit: Payer: Self-pay

## 2019-12-16 ENCOUNTER — Encounter: Payer: Self-pay | Admitting: Family Medicine

## 2019-12-16 ENCOUNTER — Ambulatory Visit (HOSPITAL_COMMUNITY)
Admission: RE | Admit: 2019-12-16 | Discharge: 2019-12-16 | Disposition: A | Discharge status: Home / Self Care | Payer: PPO | Source: Ambulatory Visit | Attending: Family Medicine | Admitting: Family Medicine

## 2019-12-16 ENCOUNTER — Ambulatory Visit (INDEPENDENT_AMBULATORY_CARE_PROVIDER_SITE_OTHER): Payer: PPO | Admitting: Family Medicine

## 2019-12-16 VITALS — BP 96/60 | HR 80 | Temp 96.5°F | Resp 16 | Ht 73.0 in | Wt 256.0 lb

## 2019-12-16 DIAGNOSIS — M79671 Pain in right foot: Secondary | ICD-10-CM | POA: Diagnosis not present

## 2019-12-16 DIAGNOSIS — L03115 Cellulitis of right lower limb: Secondary | ICD-10-CM | POA: Diagnosis not present

## 2019-12-16 DIAGNOSIS — M7989 Other specified soft tissue disorders: Secondary | ICD-10-CM | POA: Diagnosis not present

## 2019-12-16 MED ORDER — CEPHALEXIN 500 MG PO CAPS: 500.0000 mg | ORAL_CAPSULE | Freq: Three times a day (TID) | ORAL | 0 refills | Status: DC

## 2019-12-16 NOTE — Progress Notes (Signed)
Subjective:    Patient ID: Bernard Ramirez, male    DOB: September 10, 1930, 86 y.o.   MRN: [id number]  Medication Refill    10/20 Patient is a very pleasant 86 year old male here today to establish care.  Patient was previously under the care of Dr. Sinda Du who has retired.  He is transferring his care to this office.  Patient's past medical history is significant for persistent atrial fibrillation.  However he is on no oral anticoagulant due to a history of a GI bleed.  This is well documented in his chart.  He is on Plavix however given history of a stroke in 2017.  MRI at that time revealed an acute left occipital infarct.  Patient also had a prostate biopsy performed in 2017 which revealed prostate adenocarcinoma with a Gleason score of 9.  Bone scan at that time revealed a possible increased uptake in the left sixth rib as well as in the right sternum.  However patient declined treatment and has not seen urology since that time.  Patient is also on hydrocodone/acetaminophen 10/325.  He takes 5 tablets a day.  He has been getting 150 tablets every month under the care of his primary care physician.  I reviewed the drug database and it is consistent that he has been receiving 150 tablets monthly from Dr. Sinda Du.  He is not receiving any other pain medication from any other doctor.  The patient is unable to tolerate NSAIDs for his chronic arthritic pain in his knees and in his lower back due to his history of GI bleeding.  Patient is asking if I will continue this for him.  I believe this is appropriate given his age and his frailty.  He does complain of severe lower back pain.  We discussed this today.  Pain is located roughly at the level of L5 in the center of the back.  I am concerned that the patient may have prostate metastasis to his lower back.  We discussed possibly repeating a bone scan to determine this however at the present time patient still declines treatment and does not want to  proceed with a bone scan.  He is asking if I will inject his left knee with cortisone.  His last cortisone injection was in June per his report.  He has been getting these occasionally from his PCP for arthritis in the left knee.  This provides some symptomatic relief.  Otherwise he is doing well.  He does have some mild memory loss and his wife accompanies him on today's exam to help provide additional history.  At that time, my plan was: Spent 30 minutes today with the patient.  I reviewed the national narcotic drug registry.  His report is consistent with the amount of pain medication he has been receiving.  Therefore I will continue to refill the patient's hydrocodone/acetaminophen 10/325 1 tablet every 4-6 hours as needed.  I will give him 150 tablets/month equating to 5 tablets/day.  Patient does have atrial fibrillation however he has been deemed inappropriate for oral anticoagulation due to a GI bleed.  Therefore he will continue Plavix.  He last had lab work drawn in September 28 and there was no evidence of anemia and he.denies any melena or hematochezia so I will recheck blood counts in 3 months with a CBC.  He does have a history of a CVA for which she is taking Plavix for secondary prevention of stroke.  I have recommended rechecking his cholesterol in  3 months.  His goal LDL cholesterol would be less than 70.  For his osteoarthritis in his left knee, using sterile technique, I injected the left knee with 2 cc lidocaine, 2 cc of Marcaine, and 2 cc of 40 mg/mL Kenalog.  Patient tolerated the procedure well without complication.  Regarding his prostate cancer I explained the results of his biopsy in 2017.  Also explained the results of his bone scan.  I have recommended repeating his bone scan given his persistent low back pain in his left knee pain to evaluate for any other evidence of metastasis which may change therapy.  Patient is still of the opinion not to treat his prostate cancer and denies the  bone scan today which I believe is reasonable.  Follow-up in 3 months.  09/09/19 Patient presents complaining of pain all over his body primarily in his right shoulder now and also in his left knee.  He is requesting a repeat cortisone injection in his left knee.  He has pain with ambulation.  He has pain with standing.  He has pain such as aching and throbbing that keeps him awake at night.  The cortisone shot provides some temporary relief.  His blood pressure today is normal at 130/70.  He denies any chest pain, shortness of breath, dyspnea on exertion however he is extremely sedentary.  He denies any melena or hematochezia.  He continues to take Plavix given his history of a CVA.  He has not witnessed evidence of bleeding on the Plavix.  He is not on anticoagulation despite being in atrial fibrillation due to his history of a GI bleed.  He is taking Protonix for gastric protection.  He denies any abdominal discomfort or dysphagia or odynophagia or heartburn.  12/09/19 Patient has +1 pitting edema in both legs up to his knees.  This is markedly increased since last time I seen him.  He is taking Lasix 1/2 tablets in the morning and 1 tablet in the evening.  He hesitates to take anymore due to polyuria which keeps him from being able to leave his home due to having to go to the bathroom.  He denies any GI bleed although he does have a skin tear over the right lateral epicondyle  from a recent fall.  The skin tear is approximately 2.5 cm in diameter.  There is bruising around it.  I covered this with a nonadherent gauze as well as Covan and discussed dressing changes.  He does report dyspnea on exertion.  His wife states that he breathes very heavy with minimal activity.  He denies any dyspnea at rest.  He denies any orthopnea.  He denies any syncope or near syncope or palpitations despite being in A. fib.  He is currently only on Plavix due to his history of GI bleed.  He does have numerous senile purpura on  the dorsums of both forearms.  Currently he is taking 150 tablets of hydrocodone per month.  This is helping to manage his chronic pain.  There is no evidence of abuse or diversion.  At that time, my plan was: Patient is currently in atrial fibrillation.  He is not anticoagulated due to history of GI bleeds however he is taking Plavix given history of stroke.  At the present time he denies any symptoms of gastritis or peptic ulcer disease.  He denies any melena.  He seems to be tolerating the Plavix well.  I will check a CBC to monitor for any anemia.  Patient had a cookie prior to his appointment today and therefore cannot check his fasting lipid panel.  Given his history of congestive heart failure and his edema on exam, I have recommended increasing his Lasix to 2 tablets in the morning and 1 tablet in the evening.  Ideally I like him on 2 tablets twice a day however he is afraid that he is unable to tolerate that.  He will call me back Friday with an update to let me know how the increased dose of diuretic is working.  12/16/19 Patient fell 1 week ago and injured his right foot.  He now has bruising and swelling over the third and fourth metatarsals and bruising in his third and fourth toes.  He has pain over the third and fourth metatarsals as well as the accompanying toes.  There is significant erythema spreading over the dorsum of his foot which is tender and painful and hot to the touch.    Past Medical History:  Diagnosis Date  . Anemia    04/2012: H&H-10.2/31.5, MCV-77; 06/2012:12/38.9  . Arthritis   . Benign prostatic hypertrophy    s/p transurethral resection of the prostate  . Chest pain    2004; with dyspnea, stress nuclear-normal EF + questionable inferior wall ischemia, normal coronary angiography;  2010-negative stress nuclear  . CVA (cerebral infarction)    asymptomatic (seen on CT)  . Diarrhea    h/o Hemoccult-positive stool  . Dysrhythmia   . Edema of both legs   . GERD  (gastroesophageal reflux disease)   . History of blood transfusion   . History of kidney stones   . History of urinary tract infection   . Hypertension   . Jerking movements of extremities    left arm   . Low back pain   . Obstructive sleep apnea    pt states can not use the CPAP  . Peripheral neuropathy    lower legs and feet bilat   . Persistent atrial fibrillation ([place]) 2013   Asymptomatic; diagnosed in 05/2012  . Pneumonia   . Prostate cancer ([place]) 2017   stage 4  . Shortness of breath dyspnea    pt states only develops SOB if tries to do something too quickly  . Upper GI bleed 1985   1985-peptic ulcer disease; initial hemigastrectomy; subsequent Billroth II  . Urinary hesitancy   . Vertigo    ED evaluation-06/2012   Past Surgical History:  Procedure Laterality Date  . Billroth II    . CHOLECYSTECTOMY    . COLONOSCOPY  Approximately 2000  . COLONOSCOPY  10/2012   Colonoscopy March 2014 at [facility], difficulty exam requiring fluoroscopy and overtube. Patient developed severe bradycardia again during his procedure like he did [facility]. Multiple polyps removed which were tubular adenomas. Previously tattooed sites at 20 and 30 cm were free of any polypoid change. Left-sided diverticulosis noted.  . COLONOSCOPY WITH ESOPHAGOGASTRODUODENOSCOPY (EGD)  06-17-2009   OXB:DZHGDJME tubular adenomas and 2 tubulovillous adenomas, incomplete, 3 clips placed  . ESOPHAGOGASTRODUODENOSCOPY  06/17/2009   SLF: normal/chronic gastritis (non-h pylori)  . ESOPHAGOGASTRODUODENOSCOPY N/A 05/11/2015   QAS:TMHDQQI anemai due to postgastrectomy state/moderate erosive gastritis  . INGUINAL HERNIA REPAIR     Left  . ROTATOR CUFF REPAIR  1991   Bilateral  . TRANSURETHRAL RESECTION OF PROSTATE  06/2016  . TRANSURETHRAL RESECTION OF PROSTATE N/A 12/27/2012   Procedure: TRANSURETHRAL RESECTION OF THE PROSTATE (TURP);  Surgeon: Marissa Nestle, MD;  Location: [facility];  Service: Urology;  Laterality:  N/A;  . TRANSURETHRAL RESECTION OF PROSTATE N/A 04/11/2016   Procedure: TRANSURETHRAL RESECTION OF THE PROSTATE (TURP);  Surgeon: Irine Seal, MD;  Location: [facility];  Service: Urology;  Laterality: N/A;  . VAGOTOMY AND PYLOROPLASTY  1970s   Details of procedure uncertain; 9528U   Current Outpatient Medications on File Prior to Visit  Medication Sig Dispense Refill  . clobetasol cream (TEMOVATE) 1.32 % Apply 1 application topically daily as needed (use as directed for skin).     Marland Kitchen clopidogrel (PLAVIX) 75 MG tablet Take 1 tablet (75 mg total) by mouth daily. 90 tablet 3  . furosemide (LASIX) 40 MG tablet TAKE 1.5 TABLET BY MOUTH EVERY MORNING THEN 1 TABLET BY MOUTH EVERY AFTERNOON (Patient taking differently: TAKE 2 TABLET BY MOUTH EVERY MORNING THEN 1 TABLET BY MOUTH EVERY AFTERNOON) 225 tablet 1  . HYDROcodone-acetaminophen (NORCO) 10-325 MG tablet TAKE 1 TABLET  FIVE TIMES DAILY AS NEEDED FOR PAIN 150 tablet 0  . hydrOXYzine (ATARAX/VISTARIL) 50 MG tablet Take 1 tablet (50 mg total) by mouth every 6 (six) hours as needed for itching. 30 tablet 0  . losartan (COZAAR) 100 MG tablet Take 1 tablet (100 mg total) by mouth daily. 90 tablet 2  . meclizine (ANTIVERT) 25 MG tablet Take 25 mg by mouth at bedtime.     Marland Kitchen nystatin (MYCOSTATIN/NYSTOP) 100000 UNIT/GM POWD Apply 1 g topically daily as needed (rash).     . pantoprazole (PROTONIX) 40 MG tablet Take 1 tablet (40 mg total) by mouth daily. 90 tablet 2  . potassium chloride SA (K-DUR,KLOR-CON) 20 MEQ tablet Take 1 tablet (20 mEq total) by mouth daily. 45 tablet 6  . pravastatin (PRAVACHOL) 20 MG tablet TAKE 1 TABLET(20 MG) BY MOUTH DAILY 90 tablet 2  . triamcinolone cream (KENALOG) 0.1 % Apply 1 application topically daily as needed (for irritation).      No current facility-administered medications on file prior to visit.   Allergies  Allergen Reactions  . Contrast Media [Iodinated Diagnostic Agents]     Pt states he was given "dye" 20 yrs ago,  anaphylaxis & syncope - was told to never have it again   . Gabapentin Itching  . Aspirin Other (See Comments)    Blistering   . Latex Rash  . Lipitor [Atorvastatin] Itching  . Tape Rash    EKG leads   Social History   Socioeconomic History  . Marital status: Married    Spouse name: Not on file  . Number of children: 5  . Years of education: Not on file  . Highest education level: Not on file  Occupational History  . Occupation: Retired Pensions consultant: RETIRED  Tobacco Use  . Smoking status: Former Smoker    Years: 12.00    Types: Cigars    Quit date: 08/22/2003    Years since quitting: 16.3  . Smokeless tobacco: Never Used  . Tobacco comment: Quit many years ago; few cigars per day; didn't inhale  Substance and Sexual Activity  . Alcohol use: No    Alcohol/week: 1.0 standard drinks    Types: 1 Standard drinks or equivalent per week  . Drug use: No  . Sexual activity: Not on file  Other Topics Concern  . Not on file  Social History Narrative   Lives w/ wife in [place]   Retired truck Geophysicist/field seismologist   Social Determinants of Radio broadcast assistant Strain:   . Difficulty of Paying  Living Expenses:   Food Insecurity:   . Worried About Charity fundraiser in the Last Year:   . Arboriculturist in the Last Year:   Transportation Needs:   . Film/video editor (Medical):   Marland Kitchen Lack of Transportation (Non-Medical):   Physical Activity:   . Days of Exercise per Week:   . Minutes of Exercise per Session:   Stress:   . Feeling of Stress :   Social Connections:   . Frequency of Communication with Friends and Family:   . Frequency of Social Gatherings with Friends and Family:   . Attends Religious Services:   . Active Member of Clubs or Organizations:   . Attends Archivist Meetings:   Marland Kitchen Marital Status:   Intimate Partner Violence:   . Fear of Current or Ex-Partner:   . Emotionally Abused:   Marland Kitchen Physically Abused:   . Sexually Abused:        Review of Systems  All other systems reviewed and are negative.      Objective:   Physical Exam Vitals reviewed.  Constitutional:      General: He is not in acute distress.    Appearance: Normal appearance. He is obese. He is not ill-appearing or toxic-appearing.  Cardiovascular:     Rate and Rhythm: Normal rate. Rhythm irregular.     Heart sounds: Normal heart sounds. No murmur. No friction rub. No gallop.   Pulmonary:     Effort: Pulmonary effort is normal. No respiratory distress.     Breath sounds: Normal breath sounds. No stridor. No wheezing, rhonchi or rales.  Abdominal:     General: Abdomen is flat. Bowel sounds are normal. There is no distension.     Palpations: Abdomen is soft. There is no mass.     Tenderness: There is no abdominal tenderness. There is no guarding.  Musculoskeletal:     Right lower leg: Edema present.     Left lower leg: Edema present.     Right foot: Decreased range of motion. Swelling and deformity present.  Neurological:     General: No focal deficit present.     Mental Status: He is alert and oriented to person, place, and time.     Cranial Nerves: No cranial nerve deficit.     Sensory: No sensory deficit.     Motor: No weakness.     Coordination: Coordination normal.     Gait: Gait abnormal (Shuffling gait.  Requires a cane to ambulate).     Deep Tendon Reflexes: Reflexes normal.  Psychiatric:        Mood and Affect: Mood normal.        Thought Content: Thought content normal.        Judgment: Judgment normal.           Assessment & Plan:  Right foot pain - Plan: DG Foot Complete Right  Cellulitis of right foot  Obtain x-ray of the right foot to rule out fracture in the third or fourth metatarsals.  Begin Keflex 500 mg p.o. 3 times daily for 7 days for possible cellulitis of the foot.

## 2019-12-17 ENCOUNTER — Telehealth: Payer: Self-pay | Admitting: Family Medicine

## 2019-12-17 NOTE — Telephone Encounter (Signed)
Calling about xray results from yesterday  [phone]

## 2019-12-17 NOTE — Telephone Encounter (Signed)
Pt aware of results 

## 2019-12-28 DIAGNOSIS — Z91041 Radiographic dye allergy status: Secondary | ICD-10-CM | POA: Diagnosis not present

## 2019-12-28 DIAGNOSIS — Z886 Allergy status to analgesic agent status: Secondary | ICD-10-CM | POA: Diagnosis not present

## 2019-12-28 DIAGNOSIS — Z23 Encounter for immunization: Secondary | ICD-10-CM | POA: Diagnosis not present

## 2019-12-28 DIAGNOSIS — S81812A Laceration without foreign body, left lower leg, initial encounter: Secondary | ICD-10-CM | POA: Diagnosis not present

## 2019-12-29 ENCOUNTER — Other Ambulatory Visit: Payer: Self-pay | Admitting: Family Medicine

## 2019-12-29 DIAGNOSIS — Z8673 Personal history of transient ischemic attack (TIA), and cerebral infarction without residual deficits: Secondary | ICD-10-CM

## 2019-12-29 DIAGNOSIS — N182 Chronic kidney disease, stage 2 (mild): Secondary | ICD-10-CM

## 2019-12-29 DIAGNOSIS — I4819 Other persistent atrial fibrillation: Secondary | ICD-10-CM

## 2019-12-29 DIAGNOSIS — I5020 Unspecified systolic (congestive) heart failure: Secondary | ICD-10-CM

## 2019-12-30 NOTE — Chronic Care Management (AMB) (Addendum)
Chronic Care Management Pharmacy  Name: Bernard Ramirez  MRN: [id number] DOB: Jul 24, 1931   Chief Complaint/ HPI  Bernard Ramirez,  86 y.o. , male presents for their Initial CCM visit with the clinical pharmacist In office.  PCP : [name], MD  Their chronic conditions include: hypertension, afib, CHF, CKD, hyperlipidemia.  Office Visits:  12/16/2019 ([facility]) - bruising and swelling of right foot, obtained x-ray and started Keflex 500mg  tid for one week for possible cellulitis  12/09/2019 - ([facility]) not anticoagulated for afib besides plavix due to GI bleed history, tolerating Plavix well, increased Lasix to 2 tablets in am and one in pm for increased edema on exam.  09/09/2019 ([facility]) - Complains of knee pain that will not subside, injected with lidocaine/marcaine/Kenalog.   Consult Visit:  12/28/2019 [facility], ED) - leg could he cannot get to stop bleeding from bumping it while in wheelchair, given stiches to repair and updated Tdap   Medications: Outpatient Encounter Medications as of 12/31/2019  Medication Sig   cephALEXin (KEFLEX) 500 MG capsule Take 1 capsule (500 mg total) by mouth 3 (three) times daily.   clobetasol cream (TEMOVATE) 2.53 % Apply 1 application topically daily as needed (use as directed for skin).    clopidogrel (PLAVIX) 75 MG tablet Take 1 tablet (75 mg total) by mouth daily.   furosemide (LASIX) 40 MG tablet TAKE 1.5 TABLET BY MOUTH EVERY MORNING THEN 1 TABLET BY MOUTH EVERY AFTERNOON (Patient taking differently: TAKE 2 TABLET BY MOUTH EVERY MORNING THEN 1 TABLET BY MOUTH EVERY AFTERNOON)   HYDROcodone-acetaminophen (NORCO) 10-325 MG tablet TAKE 1 TABLET  FIVE TIMES DAILY AS NEEDED FOR PAIN   hydrOXYzine (ATARAX/VISTARIL) 50 MG tablet Take 1 tablet (50 mg total) by mouth every 6 (six) hours as needed for itching.   losartan (COZAAR) 100 MG tablet Take 1 tablet (100 mg total) by mouth daily.   meclizine (ANTIVERT) 25 MG tablet Take 25 mg by  mouth at bedtime.    nystatin (MYCOSTATIN/NYSTOP) 100000 UNIT/GM POWD Apply 1 g topically daily as needed (rash).    pantoprazole (PROTONIX) 40 MG tablet Take 1 tablet (40 mg total) by mouth daily.   pravastatin (PRAVACHOL) 20 MG tablet TAKE 1 TABLET(20 MG) BY MOUTH DAILY   triamcinolone cream (KENALOG) 0.1 % Apply 1 application topically daily as needed (for irritation).    potassium chloride SA (K-DUR,KLOR-CON) 20 MEQ tablet Take 1 tablet (20 mEq total) by mouth daily. (Patient not taking: Reported on 12/31/2019)   No facility-administered encounter medications on file as of 12/31/2019.     Current Diagnosis/Assessment:    Merchant navy officer: Low Risk    Difficulty of Paying Living Expenses: Not hard at all    Goals Addressed   None    Heart Failure   Last ejection fraction: 58%  Patient has failed these meds in past: none noted Patient is currently controlled on the following medications: furosemide 40mg   Patient is not taking furosemide 40mg  two tablets in the morning and 1 tablet in evening.  No issues with having to urinate frequently.  Patient scared to take these if he has to go somewhere as he has no control over his bladder.    Encouraged patient to take these as directed to prevent fluid.  He verbalized understanding.  Plan  Continue current medications   ,  Hypertension    Office blood pressures are  BP Readings from Last 3 Encounters:  12/16/19 96/60  12/09/19 98/60  10/29/19 108/64  Patient has failed these meds in the past: none noted  Patient checks BP at home daily  Patient home BP readings are ranging: 90s/60s  Patient is currently controlled on the following medications: losartan 100mg  daily   Reports short spell of dizziness when he lays down at night.  Currently taking meclizine.  Discussed trying to lay down slowly as it may be due to sudden quick movement.  Says they go away after about 30 seconds.  Plan  Continue current  medications.  Eat low salt diet to lower BP and to prevent fluid.     Hyperlipidemia   Lipid Panel     Component Value Date/Time   CHOL 168 09/10/2019 1056   TRIG 114 09/10/2019 1056   HDL 46 09/10/2019 1056   CHOLHDL 3.7 09/10/2019 1056   VLDL 38 05/18/2009 0525   LDLCALC 101 (H) 09/10/2019 1056    Patient has failed these meds in past: none noted Patient is currently controlled on the following medications: pravastatin 20mg  daily  Tolerating statin well. Educated on importance of controlling cholesterol with his cardiovascular history.  Plan  Continue current medications  Cellulitis    Patient is currently controlled on the following medications: Keflex 500mg   Short course of ABX for possible cellulitis in foot.  Improved redness and swelling when he took his show off today.  He wondered if he should be on another round of antibiotics.   Plan  Told him to make appointment with Dr. Dennard Schaumann for follow up if no further improvement of it gets worse in the next few days.  Vaccines   Reviewed and discussed patient's vaccination history.     Medication Management   Miscellaneous medications: clopidogrel Wife took him off of this because he was bleeding really bad when he cut his leg.  Scared he would "bleed to death." Discussed importance of this medication for his cardiovascular health and history.  Understands importance of continuing to take. OTC's: potassium Patient currently uses [employer].   Patient reports using pill box method to organize medications and promote adherence. Denies missed doses of medication.   Beverly Milch, PharmD Clinical Pharmacist [place] [phone]   I have collaborated with the care management provider regarding care management and care coordination activities outlined in this encounter and have reviewed this encounter including documentation in the note and care plan. I am certifying that I agree  with the content of this note and encounter as supervising physician.

## 2019-12-31 ENCOUNTER — Ambulatory Visit: Payer: PPO | Admitting: Pharmacist

## 2019-12-31 DIAGNOSIS — I10 Essential (primary) hypertension: Secondary | ICD-10-CM

## 2019-12-31 DIAGNOSIS — E785 Hyperlipidemia, unspecified: Secondary | ICD-10-CM

## 2019-12-31 DIAGNOSIS — I5020 Unspecified systolic (congestive) heart failure: Secondary | ICD-10-CM

## 2019-12-31 NOTE — Patient Instructions (Addendum)
Thank you for meeting with me today!  I look forward to working with you to help you meet all of your healthcare goals and answer any questions you may have.  Feel free to contact me anytime!   Visit Information  Goals Addressed            This Visit's Progress   . Care Plan:       CARE PLAN ENTRY  Current Barriers:  . Chronic Disease Management support, education, and care coordination needs related to Hypertension, Hyperlipidemia, and Heart Failure   Hypertension . Pharmacist Clinical Goal(s): o Over the next 90 days, patient will work with PharmD and providers to maintain BP goal <140/90 . Current regimen:  o Losartan 100mg  daily . Interventions: o Continue current therapy. . Patient self care activities - Over the next 90 days, patient will: o Check BP daily, document, and provide at future appointments o Ensure daily salt intake < 2300 mg/day o Report consistent readings > 140/90 to PharmD or PCP  Hyperlipidemia . Pharmacist Clinical Goal(s): o Over the next 90 days, patient will work with PharmD and providers to maintain LDL goal < 100 . Current regimen:  o Pravastatin 20mg  daily . Interventions: o Continue current therapy . Patient self care activities - Over the next 90 days, patient will: o Continue to take medication as directed.   Heart Failure . Pharmacist Clinical Goal(s): o Over the next 90 days, patient will work with PharmD and providers to maintain optimal fluid status to prevent exacerbations. . Current regimen: furosemide 40mg  two tablets in the morning and one tablet in the evening . Interventions o Comprehensive medication review performed. o Continue current medication management strategy . Patient self care activities - Over the next 90 days, patient will: o Focus on medication adherence by using a pull box o Take medications as prescribed o Report any increase in swelling to PharmD or providers.  Initial goal documentation        Mr.  Bernard Ramirez was given information about Chronic Care Management services today including:  1. CCM service includes personalized support from designated clinical staff supervised by his physician, including individualized plan of care and coordination with other care providers 2. 24/7 contact phone numbers for assistance for urgent and routine care needs. 3. Standard insurance, coinsurance, copays and deductibles apply for chronic care management only during months in which we provide at least 20 minutes of these services. Most insurances cover these services at 100%, however patients may be responsible for any copay, coinsurance and/or deductible if applicable. This service may help you avoid the need for more expensive face-to-face services. 4. Only one practitioner may furnish and bill the service in a calendar month. 5. The patient may stop CCM services at any time (effective at the end of the month) by phone call to the office staff.  Patient agreed to services and verbal consent obtained.   The patient verbalized understanding of instructions provided today and agreed to receive a mailed copy of patient instruction and/or educational materials. Telephone follow up appointment with pharmacy team member scheduled for: 04/01/2020 @ 9:30 AM Beverly Milch, PharmD Clinical Pharmacist [place] [phone]   DASH Eating Plan DASH stands for "Dietary Approaches to Stop Hypertension." The DASH eating plan is a healthy eating plan that has been shown to reduce high blood pressure (hypertension). It may also reduce your risk for type 2 diabetes, heart disease, and stroke. The DASH eating plan may also help with  weight loss. What are tips for following this plan?  General guidelines  Avoid eating more than 2,300 mg (milligrams) of salt (sodium) a day. If you have hypertension, you may need to reduce your sodium intake to 1,500 mg a day.  Limit alcohol intake to no more than 1  drink a day for nonpregnant women and 2 drinks a day for men. One drink equals 12 oz of beer, 5 oz of wine, or 1 oz of hard liquor.  Work with your health care provider to maintain a healthy body weight or to lose weight. Ask what an ideal weight is for you.  Get at least 30 minutes of exercise that causes your heart to beat faster (aerobic exercise) most days of the week. Activities may include walking, swimming, or biking.  Work with your health care provider or diet and nutrition specialist (dietitian) to adjust your eating plan to your individual calorie needs. Reading food labels   Check food labels for the amount of sodium per serving. Choose foods with less than 5 percent of the Daily Value of sodium. Generally, foods with less than 300 mg of sodium per serving fit into this eating plan.  To find whole grains, look for the word "whole" as the first word in the ingredient list. Shopping  Buy products labeled as "low-sodium" or "no salt added."  Buy fresh foods. Avoid canned foods and premade or frozen meals. Cooking  Avoid adding salt when cooking. Use salt-free seasonings or herbs instead of table salt or sea salt. Check with your health care provider or pharmacist before using salt substitutes.  Do not fry foods. Cook foods using healthy methods such as baking, boiling, grilling, and broiling instead.  Cook with heart-healthy oils, such as olive, canola, soybean, or sunflower oil. Meal planning  Eat a balanced diet that includes: ? 5 or more servings of fruits and vegetables each day. At each meal, try to fill half of your plate with fruits and vegetables. ? Up to 6-8 servings of whole grains each day. ? Less than 6 oz of lean meat, poultry, or fish each day. A 3-oz serving of meat is about the same size as a deck of cards. One egg equals 1 oz. ? 2 servings of low-fat dairy each day. ? A serving of nuts, seeds, or beans 5 times each week. ? Heart-healthy fats. Healthy fats  called Omega-3 fatty acids are found in foods such as flaxseeds and coldwater fish, like sardines, salmon, and mackerel.  Limit how much you eat of the following: ? Canned or prepackaged foods. ? Food that is high in trans fat, such as fried foods. ? Food that is high in saturated fat, such as fatty meat. ? Sweets, desserts, sugary drinks, and other foods with added sugar. ? Full-fat dairy products.  Do not salt foods before eating.  Try to eat at least 2 vegetarian meals each week.  Eat more home-cooked food and less restaurant, buffet, and fast food.  When eating at a restaurant, ask that your food be prepared with less salt or no salt, if possible. What foods are recommended? The items listed may not be a complete list. Talk with your dietitian about what dietary choices are best for you. Grains Whole-grain or whole-wheat bread. Whole-grain or whole-wheat pasta. Brown rice. [place]. Bulgur. Whole-grain and low-sodium cereals. Pita bread. Low-fat, low-sodium crackers. Whole-wheat flour tortillas. Vegetables Fresh or frozen vegetables (raw, steamed, roasted, or grilled). Low-sodium or reduced-sodium tomato and vegetable juice. Low-sodium  or reduced-sodium tomato sauce and tomato paste. Low-sodium or reduced-sodium canned vegetables. Fruits All fresh, dried, or frozen fruit. Canned fruit in natural juice (without added sugar). Meat and other protein foods Skinless chicken or [place]. Ground chicken or [place]. Pork with fat trimmed off. Fish and seafood. Egg whites. Dried beans, peas, or lentils. Unsalted nuts, nut butters, and seeds. Unsalted canned beans. Lean cuts of beef with fat trimmed off. Low-sodium, lean deli meat. Dairy Low-fat (1%) or fat-free (skim) milk. Fat-free, low-fat, or reduced-fat cheeses. Nonfat, low-sodium ricotta or cottage cheese. Low-fat or nonfat yogurt. Low-fat, low-sodium cheese. Fats and oils Soft margarine without trans fats. Vegetable oil. Low-fat,  reduced-fat, or light mayonnaise and salad dressings (reduced-sodium). Canola, safflower, olive, soybean, and sunflower oils. Avocado. Seasoning and other foods Herbs. Spices. Seasoning mixes without salt. Unsalted popcorn and pretzels. Fat-free sweets. What foods are not recommended? The items listed may not be a complete list. Talk with your dietitian about what dietary choices are best for you. Grains Baked goods made with fat, such as croissants, muffins, or some breads. Dry pasta or rice meal packs. Vegetables Creamed or fried vegetables. Vegetables in a cheese sauce. Regular canned vegetables (not low-sodium or reduced-sodium). Regular canned tomato sauce and paste (not low-sodium or reduced-sodium). Regular tomato and vegetable juice (not low-sodium or reduced-sodium). Angie Fava. Olives. Fruits Canned fruit in a light or heavy syrup. Fried fruit. Fruit in cream or butter sauce. Meat and other protein foods Fatty cuts of meat. Ribs. Fried meat. Berniece Salines. Sausage. Bologna and other processed lunch meats. Salami. Fatback. Hotdogs. Bratwurst. Salted nuts and seeds. Canned beans with added salt. Canned or smoked fish. Whole eggs or egg yolks. Chicken or [place] with skin. Dairy Whole or 2% milk, cream, and half-and-half. Whole or full-fat cream cheese. Whole-fat or sweetened yogurt. Full-fat cheese. Nondairy creamers. Whipped toppings. Processed cheese and cheese spreads. Fats and oils Butter. Stick margarine. Lard. Shortening. Ghee. Bacon fat. Tropical oils, such as coconut, palm kernel, or palm oil. Seasoning and other foods Salted popcorn and pretzels. Onion salt, garlic salt, seasoned salt, table salt, and sea salt. Worcestershire sauce. Tartar sauce. Barbecue sauce. Teriyaki sauce. Soy sauce, including reduced-sodium. Steak sauce. Canned and packaged gravies. Fish sauce. Oyster sauce. Cocktail sauce. Horseradish that you find on the shelf. Ketchup. Mustard. Meat flavorings and tenderizers.  Bouillon cubes. Hot sauce and Tabasco sauce. Premade or packaged marinades. Premade or packaged taco seasonings. Relishes. Regular salad dressings. Where to find more information:  National Heart, Lung, and [place]: https://wilson-eaton.com/  American Heart Association: www.heart.org Summary  The DASH eating plan is a healthy eating plan that has been shown to reduce high blood pressure (hypertension). It may also reduce your risk for type 2 diabetes, heart disease, and stroke.  With the DASH eating plan, you should limit salt (sodium) intake to 2,300 mg a day. If you have hypertension, you may need to reduce your sodium intake to 1,500 mg a day.  When on the DASH eating plan, aim to eat more fresh fruits and vegetables, whole grains, lean proteins, low-fat dairy, and heart-healthy fats.  Work with your health care provider or diet and nutrition specialist (dietitian) to adjust your eating plan to your individual calorie needs. This information is not intended to replace advice given to you by your health care provider. Make sure you discuss any questions you have with your health care provider. Document Revised: 07/20/2017 Document Reviewed: 07/31/2016 Elsevier Patient Education  2020 [employer].

## 2020-01-09 ENCOUNTER — Other Ambulatory Visit: Payer: Self-pay

## 2020-01-09 ENCOUNTER — Ambulatory Visit (INDEPENDENT_AMBULATORY_CARE_PROVIDER_SITE_OTHER): Payer: PPO | Admitting: Family Medicine

## 2020-01-09 ENCOUNTER — Other Ambulatory Visit: Payer: Self-pay | Admitting: *Deleted

## 2020-01-09 VITALS — BP 118/64 | HR 99 | Temp 97.8°F | Resp 16 | Ht 73.0 in | Wt 260.0 lb

## 2020-01-09 DIAGNOSIS — Z4802 Encounter for removal of sutures: Secondary | ICD-10-CM

## 2020-01-09 MED ORDER — HYDROCODONE-ACETAMINOPHEN 10-325 MG PO TABS: ORAL_TABLET | ORAL | 0 refills | Status: DC

## 2020-01-09 NOTE — Telephone Encounter (Signed)
Ok to refill??  Last office visit 01/09/2020.  Last refill 12/09/2019.

## 2020-01-09 NOTE — Progress Notes (Signed)
Subjective:    Patient ID: Bernard Ramirez, male    DOB: [birth year], 86 y.o.   MRN: [id number]  Patient sustained a laceration to the posterior left leg on 5/9 and it was closed with 5, 4-0 sutures.  Here for suture removal.  Wound is clean dry and intact with no evidence of cellulitis.      Past Medical History:  Diagnosis Date  . Anemia    04/2012: H&H-10.2/31.5, MCV-77; 06/2012:12/38.9  . Arthritis   . Benign prostatic hypertrophy    s/p transurethral resection of the prostate  . Chest pain    2004; with dyspnea, stress nuclear-normal EF + questionable inferior wall ischemia, normal coronary angiography;  2010-negative stress nuclear  . CVA (cerebral infarction)    asymptomatic (seen on CT)  . Diarrhea    h/o Hemoccult-positive stool  . Dysrhythmia   . Edema of both legs   . GERD (gastroesophageal reflux disease)   . History of blood transfusion   . History of kidney stones   . History of urinary tract infection   . Hypertension   . Jerking movements of extremities    left arm   . Low back pain   . Obstructive sleep apnea    pt states can not use the CPAP  . Peripheral neuropathy    lower legs and feet bilat   . Persistent atrial fibrillation ([place]) 2013   Asymptomatic; diagnosed in 05/2012  . Pneumonia   . Prostate cancer ([place]) 2017   stage 4  . Shortness of breath dyspnea    pt states only develops SOB if tries to do something too quickly  . Upper GI bleed 1985   1985-peptic ulcer disease; initial hemigastrectomy; subsequent Billroth II  . Urinary hesitancy   . Vertigo    ED evaluation-06/2012   Past Surgical History:  Procedure Laterality Date  . Billroth II    . CHOLECYSTECTOMY    . COLONOSCOPY  Approximately 2000  . COLONOSCOPY  10/2012   Colonoscopy March 2014 at [facility], difficulty exam requiring fluoroscopy and overtube. Patient developed severe bradycardia again during his procedure like he did [facility]. Multiple polyps removed which were tubular  adenomas. Previously tattooed sites at 20 and 30 cm were free of any polypoid change. Left-sided diverticulosis noted.  . COLONOSCOPY WITH ESOPHAGOGASTRODUODENOSCOPY (EGD)  06-17-2009   WVP:XTGGYIRS tubular adenomas and 2 tubulovillous adenomas, incomplete, 3 clips placed  . ESOPHAGOGASTRODUODENOSCOPY  06/17/2009   SLF: normal/chronic gastritis (non-h pylori)  . ESOPHAGOGASTRODUODENOSCOPY N/A 05/11/2015   WNI:OEVOJJK anemai due to postgastrectomy state/moderate erosive gastritis  . INGUINAL HERNIA REPAIR     Left  . ROTATOR CUFF REPAIR  1991   Bilateral  . TRANSURETHRAL RESECTION OF PROSTATE  06/2016  . TRANSURETHRAL RESECTION OF PROSTATE N/A 12/27/2012   Procedure: TRANSURETHRAL RESECTION OF THE PROSTATE (TURP);  Surgeon: Marissa Nestle, MD;  Location: [facility];  Service: Urology;  Laterality: N/A;  . TRANSURETHRAL RESECTION OF PROSTATE N/A 04/11/2016   Procedure: TRANSURETHRAL RESECTION OF THE PROSTATE (TURP);  Surgeon: Irine Seal, MD;  Location: [facility];  Service: Urology;  Laterality: N/A;  . VAGOTOMY AND PYLOROPLASTY  1970s   Details of procedure uncertain; 0938H   Current Outpatient Medications on File Prior to Visit  Medication Sig Dispense Refill  . cephALEXin (KEFLEX) 500 MG capsule Take 1 capsule (500 mg total) by mouth 3 (three) times daily. 21 capsule 0  . clobetasol cream (TEMOVATE) 8.29 % Apply 1 application topically daily as needed (  use as directed for skin).     Marland Kitchen clopidogrel (PLAVIX) 75 MG tablet Take 1 tablet (75 mg total) by mouth daily. 90 tablet 3  . furosemide (LASIX) 40 MG tablet TAKE 1.5 TABLET BY MOUTH EVERY MORNING THEN 1 TABLET BY MOUTH EVERY AFTERNOON (Patient taking differently: TAKE 2 TABLET BY MOUTH EVERY MORNING THEN 1 TABLET BY MOUTH EVERY AFTERNOON) 225 tablet 1  . HYDROcodone-acetaminophen (NORCO) 10-325 MG tablet TAKE 1 TABLET  FIVE TIMES DAILY AS NEEDED FOR PAIN 150 tablet 0  . hydrOXYzine (ATARAX/VISTARIL) 50 MG tablet Take 1 tablet (50 mg total) by mouth  every 6 (six) hours as needed for itching. 30 tablet 0  . losartan (COZAAR) 100 MG tablet Take 1 tablet (100 mg total) by mouth daily. 90 tablet 2  . meclizine (ANTIVERT) 25 MG tablet Take 25 mg by mouth at bedtime.     Marland Kitchen nystatin (MYCOSTATIN/NYSTOP) 100000 UNIT/GM POWD Apply 1 g topically daily as needed (rash).     . pantoprazole (PROTONIX) 40 MG tablet Take 1 tablet (40 mg total) by mouth daily. 90 tablet 2  . potassium chloride SA (K-DUR,KLOR-CON) 20 MEQ tablet Take 1 tablet (20 mEq total) by mouth daily. (Patient not taking: Reported on 12/31/2019) 45 tablet 6  . pravastatin (PRAVACHOL) 20 MG tablet TAKE 1 TABLET(20 MG) BY MOUTH DAILY 90 tablet 2  . triamcinolone cream (KENALOG) 0.1 % Apply 1 application topically daily as needed (for irritation).      No current facility-administered medications on file prior to visit.   Allergies  Allergen Reactions  . Contrast Media [Iodinated Diagnostic Agents]     Pt states he was given "dye" 20 yrs ago, anaphylaxis & syncope - was told to never have it again   . Gabapentin Itching  . Aspirin Other (See Comments)    Blistering   . Latex Rash  . Lipitor [Atorvastatin] Itching  . Tape Rash    EKG leads   Social History   Socioeconomic History  . Marital status: Married    Spouse name: Not on file  . Number of children: 5  . Years of education: Not on file  . Highest education level: Not on file  Occupational History  . Occupation: Retired Pensions consultant: RETIRED  Tobacco Use  . Smoking status: Former Smoker    Years: 12.00    Types: Cigars    Quit date: 08/22/2003    Years since quitting: 16.3  . Smokeless tobacco: Never Used  . Tobacco comment: Quit many years ago; few cigars per day; didn't inhale  Substance and Sexual Activity  . Alcohol use: No    Alcohol/week: 1.0 standard drinks    Types: 1 Standard drinks or equivalent per week  . Drug use: No  . Sexual activity: Not on file  Other Topics Concern  . Not on  file  Social History Narrative   Lives w/ wife in [place]   Retired truck Geophysicist/field seismologist   Social Determinants of Health   Financial Resource Strain: [place]   . Difficulty of Paying Living Expenses: Not hard at all  Food Insecurity:   . Worried About Charity fundraiser in the Last Year:   . Arboriculturist in the Last Year:   Transportation Needs:   . Film/video editor (Medical):   Marland Kitchen Lack of Transportation (Non-Medical):   Physical Activity:   . Days of Exercise per Week:   . Minutes of Exercise per Session:  Stress:   . Feeling of Stress :   Social Connections:   . Frequency of Communication with Friends and Family:   . Frequency of Social Gatherings with Friends and Family:   . Attends Religious Services:   . Active Member of Clubs or Organizations:   . Attends Archivist Meetings:   Marland Kitchen Marital Status:   Intimate Partner Violence:   . Fear of Current or Ex-Partner:   . Emotionally Abused:   Marland Kitchen Physically Abused:   . Sexually Abused:       Review of Systems  All other systems reviewed and are negative.      Objective:   Physical Exam Vitals reviewed.  Constitutional:      General: He is not in acute distress.    Appearance: Normal appearance. He is obese. He is not ill-appearing or toxic-appearing.  Cardiovascular:     Rate and Rhythm: Normal rate. Rhythm irregular.     Heart sounds: Normal heart sounds. No murmur. No friction rub. No gallop.   Pulmonary:     Effort: Pulmonary effort is normal. No respiratory distress.     Breath sounds: Normal breath sounds. No stridor. No wheezing, rhonchi or rales.  Abdominal:     General: Abdomen is flat. Bowel sounds are normal. There is no distension.     Palpations: Abdomen is soft. There is no mass.     Tenderness: There is no abdominal tenderness. There is no guarding.  Musculoskeletal:     Right lower leg: Edema present.     Left lower leg: Laceration present. Edema present.     Right foot: Decreased  range of motion. Swelling and deformity present.       Legs:  Neurological:     General: No focal deficit present.     Mental Status: He is alert and oriented to person, place, and time.     Cranial Nerves: No cranial nerve deficit.     Sensory: No sensory deficit.     Motor: No weakness.     Coordination: Coordination normal.     Gait: Gait abnormal (Shuffling gait.  Requires a cane to ambulate).     Deep Tendon Reflexes: Reflexes normal.  Psychiatric:        Mood and Affect: Mood normal.        Thought Content: Thought content normal.        Judgment: Judgment normal.           Assessment & Plan:  Visit for suture removal  5 sutures removed without difficulty.  Wound reinforced with Steri-Strips.  Wound care was discussed.

## 2020-01-16 ENCOUNTER — Ambulatory Visit: Payer: Self-pay | Admitting: Pharmacist

## 2020-01-16 NOTE — Chronic Care Management (AMB) (Addendum)
  Chronic Care Management   Follow Up Note   01/16/2020 Name: Bernard Ramirez MRN: [id number] DOB: 12/02/1930  Referred by: Bernard Frizzle, MD Reason for referral : No chief complaint on file.   Bernard Ramirez is a 86 y.o. year old male who is a primary care patient of Bernard Ramirez, Bernard Mcgee, MD. The CCM team was consulted for assistance with chronic disease management and care coordination needs.    Review of patient status, including review of consultants reports, relevant laboratory and other test results, and collaboration with appropriate care team members and the patient's provider was performed as part of comprehensive patient evaluation and provision of chronic care management services.    SDOH (Social Determinants of Health) assessments performed: No See Care Plan activities for detailed interventions related to [facility])      Outpatient Encounter Medications as of 01/16/2020  Medication Sig   clobetasol cream (TEMOVATE) 4.68 % Apply 1 application topically daily as needed (use as directed for skin).    clopidogrel (PLAVIX) 75 MG tablet Take 1 tablet (75 mg total) by mouth daily.   furosemide (LASIX) 40 MG tablet TAKE 1.5 TABLET BY MOUTH EVERY MORNING THEN 1 TABLET BY MOUTH EVERY AFTERNOON (Patient taking differently: TAKE 2 TABLET BY MOUTH EVERY MORNING THEN 1 TABLET BY MOUTH EVERY AFTERNOON)   HYDROcodone-acetaminophen (NORCO) 10-325 MG tablet TAKE 1 TABLET  FIVE TIMES DAILY AS NEEDED FOR PAIN   hydrOXYzine (ATARAX/VISTARIL) 50 MG tablet Take 1 tablet (50 mg total) by mouth every 6 (six) hours as needed for itching.   losartan (COZAAR) 100 MG tablet Take 1 tablet (100 mg total) by mouth daily.   meclizine (ANTIVERT) 25 MG tablet Take 25 mg by mouth at bedtime.    nystatin (MYCOSTATIN/NYSTOP) 100000 UNIT/GM POWD Apply 1 g topically daily as needed (rash).    pantoprazole (PROTONIX) 40 MG tablet Take 1 tablet (40 mg total) by mouth daily.   potassium chloride SA (K-DUR,KLOR-CON) 20  MEQ tablet Take 1 tablet (20 mEq total) by mouth daily.   pravastatin (PRAVACHOL) 20 MG tablet TAKE 1 TABLET(20 MG) BY MOUTH DAILY   triamcinolone cream (KENALOG) 0.1 % Apply 1 application topically daily as needed (for irritation).    No facility-administered encounter medications on file as of 01/16/2020.     Reviewed outside medication dispense report to assess adherence and evaluate care gaps.  Based on fill history patient shows adherence, no care gaps noticed with medications.    Beverly Milch, PharmD Clinical Pharmacist [place] [phone]  I have collaborated with the care management provider regarding care management and care coordination activities outlined in this encounter and have reviewed this encounter including documentation in the note and care plan. I am certifying that I agree with the content of this note and encounter as supervising physician.

## 2020-02-13 ENCOUNTER — Other Ambulatory Visit: Payer: Self-pay

## 2020-02-13 ENCOUNTER — Ambulatory Visit (INDEPENDENT_AMBULATORY_CARE_PROVIDER_SITE_OTHER): Payer: PPO | Admitting: Family Medicine

## 2020-02-13 VITALS — BP 115/60 | HR 94 | Temp 98.0°F | Wt 260.0 lb

## 2020-02-13 DIAGNOSIS — M7989 Other specified soft tissue disorders: Secondary | ICD-10-CM | POA: Diagnosis not present

## 2020-02-13 DIAGNOSIS — L03116 Cellulitis of left lower limb: Secondary | ICD-10-CM

## 2020-02-13 DIAGNOSIS — L02416 Cutaneous abscess of left lower limb: Secondary | ICD-10-CM | POA: Diagnosis not present

## 2020-02-13 DIAGNOSIS — R609 Edema, unspecified: Secondary | ICD-10-CM

## 2020-02-13 MED ORDER — CEPHALEXIN 500 MG PO CAPS: 500.0000 mg | ORAL_CAPSULE | Freq: Three times a day (TID) | ORAL | 0 refills | Status: DC

## 2020-02-13 NOTE — Progress Notes (Signed)
Subjective:    Patient ID: Bernard Ramirez, male    DOB: Mar 02, 1931, 86 y.o.   MRN: [id number]  10/20 Patient is a very pleasant 86 year old male here today to establish care.  Patient was previously under the care of Dr. Sinda Du who has retired.  He is transferring his care to this office.  Patient's past medical history is significant for persistent atrial fibrillation.  However he is on no oral anticoagulant due to a history of a GI bleed.  This is well documented in his chart.  He is on Plavix however given history of a stroke in 2017.  MRI at that time revealed an acute left occipital infarct.  Patient also had a prostate biopsy performed in 2017 which revealed prostate adenocarcinoma with a Gleason score of 9.  Bone scan at that time revealed a possible increased uptake in the left sixth rib as well as in the right sternum.  However patient declined treatment and has not seen urology since that time.  Patient is also on hydrocodone/acetaminophen 10/325.  He takes 5 tablets a day.  He has been getting 150 tablets every month under the care of his primary care physician.  I reviewed the drug database and it is consistent that he has been receiving 150 tablets monthly from Dr. Sinda Du.  He is not receiving any other pain medication from any other doctor.  The patient is unable to tolerate NSAIDs for his chronic arthritic pain in his knees and in his lower back due to his history of GI bleeding.    02/13/20 Wt Readings from Last 3 Encounters:  02/13/20 260 lb (117.9 kg)  01/09/20 260 lb (117.9 kg)  12/16/19 256 lb (116.1 kg)   Patient is currently taking 2 Lasix pills in the morning and 1 Lasix pill in the evening.  Both legs have swollen substantially distal to the knee.  He has +2 pitting edema in his left leg and +1 to +2 pitting edema in the right leg.  The skin of the left leg is erythematous warm and painful from his ankle to his knee circumferentially around the leg.  Patient  reports pain in both legs due to the swelling and distention Past Medical History:  Diagnosis Date  . Anemia    04/2012: H&H-10.2/31.5, MCV-77; 06/2012:12/38.9  . Arthritis   . Benign prostatic hypertrophy    s/p transurethral resection of the prostate  . Chest pain    2004; with dyspnea, stress nuclear-normal EF + questionable inferior wall ischemia, normal coronary angiography;  2010-negative stress nuclear  . CVA (cerebral infarction)    asymptomatic (seen on CT)  . Diarrhea    h/o Hemoccult-positive stool  . Dysrhythmia   . Edema of both legs   . GERD (gastroesophageal reflux disease)   . History of blood transfusion   . History of kidney stones   . History of urinary tract infection   . Hypertension   . Jerking movements of extremities    left arm   . Low back pain   . Obstructive sleep apnea    pt states can not use the CPAP  . Peripheral neuropathy    lower legs and feet bilat   . Persistent atrial fibrillation ([place]) 2013   Asymptomatic; diagnosed in 05/2012  . Pneumonia   . Prostate cancer ([place]) 2017   stage 4  . Shortness of breath dyspnea    pt states only develops SOB if tries to do something too quickly  .  Upper GI bleed 1985   1985-peptic ulcer disease; initial hemigastrectomy; subsequent Billroth II  . Urinary hesitancy   . Vertigo    ED evaluation-06/2012   Past Surgical History:  Procedure Laterality Date  . Billroth II    . CHOLECYSTECTOMY    . COLONOSCOPY  Approximately 2000  . COLONOSCOPY  10/2012   Colonoscopy March 2014 at [facility], difficulty exam requiring fluoroscopy and overtube. Patient developed severe bradycardia again during his procedure like he did [facility]. Multiple polyps removed which were tubular adenomas. Previously tattooed sites at 20 and 30 cm were free of any polypoid change. Left-sided diverticulosis noted.  . COLONOSCOPY WITH ESOPHAGOGASTRODUODENOSCOPY (EGD)  06-17-2009   ZTI:[id number] tubular adenomas and 2 tubulovillous  adenomas, incomplete, 3 clips placed  . ESOPHAGOGASTRODUODENOSCOPY  06/17/2009   SLF: normal/chronic gastritis (non-h pylori)  . ESOPHAGOGASTRODUODENOSCOPY N/A 05/11/2015   JAS:[id number] anemai due to postgastrectomy state/moderate erosive gastritis  . INGUINAL HERNIA REPAIR     Left  . ROTATOR CUFF REPAIR  1991   Bilateral  . TRANSURETHRAL RESECTION OF PROSTATE  06/2016  . TRANSURETHRAL RESECTION OF PROSTATE N/A 12/27/2012   Procedure: TRANSURETHRAL RESECTION OF THE PROSTATE (TURP);  Surgeon: Marissa Nestle, MD;  Location: [facility];  Service: Urology;  Laterality: N/A;  . TRANSURETHRAL RESECTION OF PROSTATE N/A 04/11/2016   Procedure: TRANSURETHRAL RESECTION OF THE PROSTATE (TURP);  Surgeon: Irine Seal, MD;  Location: [facility];  Service: Urology;  Laterality: N/A;  . VAGOTOMY AND PYLOROPLASTY  1970s   Details of procedure uncertain; 7341P   Current Outpatient Medications on File Prior to Visit  Medication Sig Dispense Refill  . clobetasol cream (TEMOVATE) 3.79 % Apply 1 application topically daily as needed (use as directed for skin).     Marland Kitchen clopidogrel (PLAVIX) 75 MG tablet Take 1 tablet (75 mg total) by mouth daily. 90 tablet 3  . furosemide (LASIX) 40 MG tablet TAKE 1.5 TABLET BY MOUTH EVERY MORNING THEN 1 TABLET BY MOUTH EVERY AFTERNOON (Patient taking differently: TAKE 2 TABLET BY MOUTH EVERY MORNING THEN 1 TABLET BY MOUTH EVERY AFTERNOON) 225 tablet 1  . HYDROcodone-acetaminophen (NORCO) 10-325 MG tablet TAKE 1 TABLET  FIVE TIMES DAILY AS NEEDED FOR PAIN 150 tablet 0  . hydrOXYzine (ATARAX/VISTARIL) 50 MG tablet Take 1 tablet (50 mg total) by mouth every 6 (six) hours as needed for itching. 30 tablet 0  . losartan (COZAAR) 100 MG tablet Take 1 tablet (100 mg total) by mouth daily. 90 tablet 2  . meclizine (ANTIVERT) 25 MG tablet Take 25 mg by mouth at bedtime.     Marland Kitchen nystatin (MYCOSTATIN/NYSTOP) 100000 UNIT/GM POWD Apply 1 g topically daily as needed (rash).     . pantoprazole (PROTONIX) 40  MG tablet Take 1 tablet (40 mg total) by mouth daily. 90 tablet 2  . potassium chloride SA (K-DUR,KLOR-CON) 20 MEQ tablet Take 1 tablet (20 mEq total) by mouth daily. 45 tablet 6  . pravastatin (PRAVACHOL) 20 MG tablet TAKE 1 TABLET(20 MG) BY MOUTH DAILY 90 tablet 2  . triamcinolone cream (KENALOG) 0.1 % Apply 1 application topically daily as needed (for irritation).      No current facility-administered medications on file prior to visit.   Allergies  Allergen Reactions  . Contrast Media [Iodinated Diagnostic Agents]     Pt states he was given "dye" 20 yrs ago, anaphylaxis & syncope - was told to never have it again   . Gabapentin Itching  . Aspirin Other (See Comments)  Blistering   . Latex Rash  . Lipitor [Atorvastatin] Itching  . Tape Rash    EKG leads   Social History   Socioeconomic History  . Marital status: Married    Spouse name: Not on file  . Number of children: 5  . Years of education: Not on file  . Highest education level: Not on file  Occupational History  . Occupation: Retired Pensions consultant: RETIRED  Tobacco Use  . Smoking status: Former Smoker    Years: 12.00    Types: Cigars    Quit date: 08/22/2003    Years since quitting: 16.4  . Smokeless tobacco: Never Used  . Tobacco comment: Quit many years ago; few cigars per day; didn't inhale  Vaping Use  . Vaping Use: Never used  Substance and Sexual Activity  . Alcohol use: No    Alcohol/week: 1.0 standard drink    Types: 1 Standard drinks or equivalent per week  . Drug use: No  . Sexual activity: Not on file  Other Topics Concern  . Not on file  Social History Narrative   Lives w/ wife in [place]   Retired truck Geophysicist/field seismologist   Social Determinants of Health   Financial Resource Strain: [name]   . Difficulty of Paying Living Expenses: Not hard at all  Food Insecurity:   . Worried About Charity fundraiser in the Last Year:   . Arboriculturist in the Last Year:   Transportation  Needs:   . Film/video editor (Medical):   Marland Kitchen Lack of Transportation (Non-Medical):   Physical Activity:   . Days of Exercise per Week:   . Minutes of Exercise per Session:   Stress:   . Feeling of Stress :   Social Connections:   . Frequency of Communication with Friends and Family:   . Frequency of Social Gatherings with Friends and Family:   . Attends Religious Services:   . Active Member of Clubs or Organizations:   . Attends Archivist Meetings:   Marland Kitchen Marital Status:   Intimate Partner Violence:   . Fear of Current or Ex-Partner:   . Emotionally Abused:   Marland Kitchen Physically Abused:   . Sexually Abused:       Review of Systems  All other systems reviewed and are negative.      Objective:   Physical Exam Vitals reviewed.  Constitutional:      General: He is not in acute distress.    Appearance: Normal appearance. He is obese. He is not ill-appearing or toxic-appearing.  Cardiovascular:     Rate and Rhythm: Normal rate. Rhythm irregular.     Heart sounds: Normal heart sounds. No murmur heard.  No friction rub. No gallop.   Pulmonary:     Effort: Pulmonary effort is normal. No respiratory distress.     Breath sounds: Normal breath sounds. No stridor. No wheezing, rhonchi or rales.  Abdominal:     General: Abdomen is flat. Bowel sounds are normal. There is no distension.     Palpations: Abdomen is soft. There is no mass.     Tenderness: There is no abdominal tenderness. There is no guarding.  Musculoskeletal:     Right lower leg: 2+ Edema present.     Left lower leg: 2+ Edema present.     Right foot: Decreased range of motion. Swelling and deformity present.       Legs:  Skin:    Findings: Erythema present.  Neurological:     General: No focal deficit present.     Mental Status: He is alert and oriented to person, place, and time.     Cranial Nerves: No cranial nerve deficit.     Sensory: No sensory deficit.     Motor: No weakness.     Coordination:  Coordination normal.     Gait: Gait abnormal (Shuffling gait.  Requires a cane to ambulate).     Deep Tendon Reflexes: Reflexes normal.  Psychiatric:        Mood and Affect: Mood normal.        Thought Content: Thought content normal.        Judgment: Judgment normal.           Assessment & Plan:  Leg swelling  Cellulitis and abscess of left leg  Increase Lasix to 2 tablets twice daily through the weekend and then recheck here on Monday.  The patient was placed in Unna boots bilaterally and I will start the patient on Keflex 500 mg 3 times daily for 7 days for secondary cellulitis in the left leg.  Recheck with my partner on Monday.

## 2020-02-16 ENCOUNTER — Ambulatory Visit (INDEPENDENT_AMBULATORY_CARE_PROVIDER_SITE_OTHER): Payer: PPO | Admitting: Nurse Practitioner

## 2020-02-16 ENCOUNTER — Other Ambulatory Visit: Payer: Self-pay

## 2020-02-16 ENCOUNTER — Encounter: Payer: Self-pay | Admitting: Nurse Practitioner

## 2020-02-16 VITALS — BP 124/68 | HR 91 | Temp 99.3°F | Resp 18 | Wt 261.0 lb

## 2020-02-16 DIAGNOSIS — M7989 Other specified soft tissue disorders: Secondary | ICD-10-CM

## 2020-02-16 DIAGNOSIS — Z09 Encounter for follow-up examination after completed treatment for conditions other than malignant neoplasm: Secondary | ICD-10-CM | POA: Diagnosis not present

## 2020-02-16 DIAGNOSIS — I5020 Unspecified systolic (congestive) heart failure: Secondary | ICD-10-CM | POA: Diagnosis not present

## 2020-02-16 DIAGNOSIS — L0390 Cellulitis, unspecified: Secondary | ICD-10-CM

## 2020-02-16 DIAGNOSIS — I4819 Other persistent atrial fibrillation: Secondary | ICD-10-CM

## 2020-02-16 NOTE — Patient Instructions (Addendum)
Your sxs have improved and you should continue the antibiotic and your daily medications as prescribed to include diuretic. You have afib with CHF. You should monitor daily for CHF fluids exacerbation such as weight gain of 5 lbs over 3 days or less from dry weight (1st weight in AM prior to eating and after voiding/evacuating) , 3 days of increased cough, sob, swelling, these sxs should be reported to your provider promptly.   You should follow: 2 Gram sodium diet restriction in 24 hour  1500 ml fluid restriction over 24 hour  Elevated lower extremities while resting and often to reduce edema  Wear compression device such as hose, Sequential compression device, wrap as in clinic to reduce edema  Avoid sodas  Take medication as prescribed  Follow up Thurday

## 2020-02-16 NOTE — Progress Notes (Signed)
Established Patient Office Visit  Subjective:  Patient ID: Bernard Ramirez, male    DOB: October 16, 1930  Age: 86 y.o. MRN: [id number]  CC:  Chief Complaint  Patient presents with  . Edema    both legs f/u    HPI [name] is a 86 year old male accompanied by his wife presenting for a follow up from Friday's office visit for ble cellulitis. He has the ble compression wrap still in place that was placed on Fridays clinic. He has dependent edema above and below the wrap. Mild erythema noticeable above the wrap. The pt reports that he wants the wraps removed. He was hesitant to reapply however did permit reapplication of a  soft Kerlix then elastic wrap. He refuses Ted compression hose, he will consider STD his wife stated she will order off of [employer]. Discussed elevation, following low sodium diet 2 gram or less, 1500 ml fluid restriction daily, and pt is resistant. He reports he loves his diet Mountain dews and does not like wearing anything on his legs.   Pt denied cp, ct, gu/gi sxs, pain, sob, or falls.   Past Medical History:  Diagnosis Date  . Anemia    04/2012: H&H-10.2/31.5, MCV-77; 06/2012:12/38.9  . Arthritis   . Benign prostatic hypertrophy    s/p transurethral resection of the prostate  . Chest pain    2004; with dyspnea, stress nuclear-normal EF + questionable inferior wall ischemia, normal coronary angiography;  2010-negative stress nuclear  . CVA (cerebral infarction)    asymptomatic (seen on CT)  . Diarrhea    h/o Hemoccult-positive stool  . Dysrhythmia   . Edema of both legs   . GERD (gastroesophageal reflux disease)   . History of blood transfusion   . History of kidney stones   . History of urinary tract infection   . Hypertension   . Jerking movements of extremities    left arm   . Low back pain   . Obstructive sleep apnea    pt states can not use the CPAP  . Peripheral neuropathy    lower legs and feet bilat   . Persistent atrial fibrillation ([place])  2013   Asymptomatic; diagnosed in 05/2012  . Pneumonia   . Prostate cancer ([place]) 2017   stage 4  . Shortness of breath dyspnea    pt states only develops SOB if tries to do something too quickly  . Upper GI bleed 1985   1985-peptic ulcer disease; initial hemigastrectomy; subsequent Billroth II  . Urinary hesitancy   . Vertigo    ED evaluation-06/2012    Past Surgical History:  Procedure Laterality Date  . Billroth II    . CHOLECYSTECTOMY    . COLONOSCOPY  Approximately 2000  . COLONOSCOPY  10/2012   Colonoscopy March 2014 at [facility], difficulty exam requiring fluoroscopy and overtube. Patient developed severe bradycardia again during his procedure like he did [facility]. Multiple polyps removed which were tubular adenomas. Previously tattooed sites at 20 and 30 cm were free of any polypoid change. Left-sided diverticulosis noted.  . COLONOSCOPY WITH ESOPHAGOGASTRODUODENOSCOPY (EGD)  06-17-2009   GDJ:MEQASTMH tubular adenomas and 2 tubulovillous adenomas, incomplete, 3 clips placed  . ESOPHAGOGASTRODUODENOSCOPY  06/17/2009   SLF: normal/chronic gastritis (non-h pylori)  . ESOPHAGOGASTRODUODENOSCOPY N/A 05/11/2015   DQQ:IWLNLGX anemai due to postgastrectomy state/moderate erosive gastritis  . INGUINAL HERNIA REPAIR     Left  . ROTATOR CUFF REPAIR  1991   Bilateral  . TRANSURETHRAL RESECTION OF  PROSTATE  06/2016  . TRANSURETHRAL RESECTION OF PROSTATE N/A 12/27/2012   Procedure: TRANSURETHRAL RESECTION OF THE PROSTATE (TURP);  Surgeon: Marissa Nestle, MD;  Location: [facility];  Service: Urology;  Laterality: N/A;  . TRANSURETHRAL RESECTION OF PROSTATE N/A 04/11/2016   Procedure: TRANSURETHRAL RESECTION OF THE PROSTATE (TURP);  Surgeon: Irine Seal, MD;  Location: [facility];  Service: Urology;  Laterality: N/A;  . VAGOTOMY AND PYLOROPLASTY  1970s   Details of procedure uncertain; 7494W    Family History  Problem Relation Age of Onset  . Diabetes Mellitus II Mother   . Allergic  rhinitis Mother   . Lung disease Father   . Allergic rhinitis Father   . Angioedema Neg Hx   . Asthma Neg Hx   . Atopy Neg Hx   . Eczema Neg Hx   . Immunodeficiency Neg Hx   . Urticaria Neg Hx   . Cystic fibrosis Neg Hx   . Lupus Neg Hx   . Emphysema Neg Hx   . Migraines Neg Hx     Social History   Socioeconomic History  . Marital status: Married    Spouse name: Not on file  . Number of children: 5  . Years of education: Not on file  . Highest education level: Not on file  Occupational History  . Occupation: Retired Pensions consultant: RETIRED  Tobacco Use  . Smoking status: Former Smoker    Years: 12.00    Types: Cigars    Quit date: 08/22/2003    Years since quitting: 16.4  . Smokeless tobacco: Never Used  . Tobacco comment: Quit many years ago; few cigars per day; didn't inhale  Vaping Use  . Vaping Use: Never used  Substance and Sexual Activity  . Alcohol use: No    Alcohol/week: 1.0 standard drink    Types: 1 Standard drinks or equivalent per week  . Drug use: No  . Sexual activity: Not on file  Other Topics Concern  . Not on file  Social History Narrative   Lives w/ wife in [place]   Retired truck Geophysicist/field seismologist   Social Determinants of Health   Financial Resource Strain: [place]   . Difficulty of Paying Living Expenses: Not hard at all  Food Insecurity:   . Worried About Charity fundraiser in the Last Year:   . Arboriculturist in the Last Year:   Transportation Needs:   . Film/video editor (Medical):   Marland Kitchen Lack of Transportation (Non-Medical):   Physical Activity:   . Days of Exercise per Week:   . Minutes of Exercise per Session:   Stress:   . Feeling of Stress :   Social Connections:   . Frequency of Communication with Friends and Family:   . Frequency of Social Gatherings with Friends and Family:   . Attends Religious Services:   . Active Member of Clubs or Organizations:   . Attends Archivist Meetings:   Marland Kitchen Marital  Status:   Intimate Partner Violence:   . Fear of Current or Ex-Partner:   . Emotionally Abused:   Marland Kitchen Physically Abused:   . Sexually Abused:     Outpatient Medications Prior to Visit  Medication Sig Dispense Refill  . cephALEXin (KEFLEX) 500 MG capsule Take 1 capsule (500 mg total) by mouth 3 (three) times daily. 21 capsule 0  . clobetasol cream (TEMOVATE) 9.67 % Apply 1 application topically daily as needed (use as directed for  skin).     . clopidogrel (PLAVIX) 75 MG tablet Take 1 tablet (75 mg total) by mouth daily. 90 tablet 3  . furosemide (LASIX) 40 MG tablet TAKE 1.5 TABLET BY MOUTH EVERY MORNING THEN 1 TABLET BY MOUTH EVERY AFTERNOON (Patient taking differently: TAKE 2 TABLET BY MOUTH EVERY MORNING THEN 1 TABLET BY MOUTH EVERY AFTERNOON) 225 tablet 1  . HYDROcodone-acetaminophen (NORCO) 10-325 MG tablet TAKE 1 TABLET  FIVE TIMES DAILY AS NEEDED FOR PAIN 150 tablet 0  . hydrOXYzine (ATARAX/VISTARIL) 50 MG tablet Take 1 tablet (50 mg total) by mouth every 6 (six) hours as needed for itching. 30 tablet 0  . losartan (COZAAR) 100 MG tablet Take 1 tablet (100 mg total) by mouth daily. 90 tablet 2  . meclizine (ANTIVERT) 25 MG tablet Take 25 mg by mouth at bedtime.     Marland Kitchen nystatin (MYCOSTATIN/NYSTOP) 100000 UNIT/GM POWD Apply 1 g topically daily as needed (rash).     . pantoprazole (PROTONIX) 40 MG tablet Take 1 tablet (40 mg total) by mouth daily. 90 tablet 2  . potassium chloride SA (K-DUR,KLOR-CON) 20 MEQ tablet Take 1 tablet (20 mEq total) by mouth daily. 45 tablet 6  . pravastatin (PRAVACHOL) 20 MG tablet TAKE 1 TABLET(20 MG) BY MOUTH DAILY 90 tablet 2  . triamcinolone cream (KENALOG) 0.1 % Apply 1 application topically daily as needed (for irritation).      No facility-administered medications prior to visit.    Allergies  Allergen Reactions  . Contrast Media [Iodinated Diagnostic Agents]     Pt states he was given "dye" 20 yrs ago, anaphylaxis & syncope - was told to never have it  again   . Gabapentin Itching  . Aspirin Other (See Comments)    Blistering   . Latex Rash  . Lipitor [Atorvastatin] Itching  . Tape Rash    EKG leads    ROS Review of Systems  All other systems reviewed and are negative.     Objective:    Physical Exam Vitals and nursing note reviewed.  Constitutional:      Appearance: Normal appearance. He is well-developed and well-groomed.  HENT:     Head: Normocephalic.     Right Ear: Hearing and external ear normal.     Left Ear: Hearing and external ear normal.     Mouth/Throat:     Lips: Pink.  Eyes:     General: Lids are normal. Lids are everted, no foreign bodies appreciated.     Extraocular Movements: Extraocular movements intact.     Conjunctiva/sclera: Conjunctivae normal.     Pupils: Pupils are equal, round, and reactive to light.  Cardiovascular:     Rate and Rhythm: Normal rate.     Comments: Non pitting Pulmonary:     Effort: Pulmonary effort is normal.  Musculoskeletal:     Cervical back: Neck supple.     Right lower leg: Edema present.     Left lower leg: Edema present.       Legs:     Comments: Drawn area with mild erythema, no drainage to ble, no blisters, no open skin, appears to be healing cellulitis  Neurological:     General: No focal deficit present.     Mental Status: He is alert and oriented to person, place, and time.  Psychiatric:        Attention and Perception: Attention and perception normal.        Mood and Affect: Mood and affect normal.  Speech: Speech normal.        Behavior: Behavior normal. Behavior is cooperative.        Thought Content: Thought content normal.        Cognition and Memory: Cognition normal.        Judgment: Judgment normal.     BP 124/68 (BP Location: Left Arm, Patient Position: Sitting, Cuff Size: Large)   Pulse 91   Temp 99.3 F (37.4 C) (Temporal)   Resp 18   Wt 261 lb (118.4 kg)   SpO2 94%   BMI 34.43 kg/m  Wt Readings from Last 3 Encounters:    02/16/20 261 lb (118.4 kg)  02/13/20 260 lb (117.9 kg)  01/09/20 260 lb (117.9 kg)     There are no preventive care reminders to display for this patient.  There are no preventive care reminders to display for this patient.   Lab Results  Component Value Date   WBC 8.8 09/10/2019   HGB 12.8 (L) 09/10/2019   HCT 39.8 09/10/2019   MCV 86.7 09/10/2019   PLT 217 09/10/2019   Lab Results  Component Value Date   NA 140 09/10/2019   K 5.0 09/10/2019   CO2 26 09/10/2019   GLUCOSE 143 (H) 09/10/2019   BUN 18 09/10/2019   CREATININE 1.06 09/10/2019   BILITOT 0.9 09/10/2019   ALKPHOS 88 06/24/2012   AST 14 09/10/2019   ALT 7 (L) 09/10/2019   PROT 6.6 09/10/2019   ALBUMIN 3.8 10/09/2016   CALCIUM 9.6 09/10/2019   ANIONGAP 13 10/11/2016   Lab Results  Component Value Date   CHOL 168 09/10/2019   Lab Results  Component Value Date   HDL 46 09/10/2019   Lab Results  Component Value Date   LDLCALC 101 (H) 09/10/2019   Lab Results  Component Value Date   TRIG 114 09/10/2019   Lab Results  Component Value Date   CHOLHDL 3.7 09/10/2019   No results found for: HGBA1C    Assessment & Plan:   Problem List Items Addressed This Visit      Cardiovascular and Mediastinum   Atrial fibrillation ([place])   Congestive heart failure (CHF) (HCC)     Other   Cellulitis    Other Visit Diagnoses    Leg swelling    -  Primary   Follow up        Your sxs have improved and you should continue the antibiotic and your daily medications as prescribed to include diuretic. You have afib with CHF. You should monitor daily for CHF fluids exacerbation such as weight gain of 5 lbs over 3 days or less from dry weight (1st weight in AM prior to eating and after voiding/evacuating) , 3 days of increased cough, sob, swelling, these sxs should be reported to your provider promptly.   You should follow:  2 Gram sodium diet restriction in 24 hour  1500 ml fluid restriction over 24  hour  Elevated lower extremities while resting and often to reduce edema  Wear compression device such as hose, Sequential compression device, wrap as in clinic to reduce edema  Avoid sodas  Take medication as prescribed  Follow up Thurday    Follow-up: Return in about 3 days (around 02/19/2020) for cellulitis follow up.    Annie Main, FNP

## 2020-02-19 ENCOUNTER — Other Ambulatory Visit: Payer: Self-pay

## 2020-02-19 ENCOUNTER — Ambulatory Visit (INDEPENDENT_AMBULATORY_CARE_PROVIDER_SITE_OTHER): Payer: PPO | Admitting: Nurse Practitioner

## 2020-02-19 VITALS — BP 120/80 | HR 77 | Temp 99.0°F | Resp 18 | Wt 260.2 lb

## 2020-02-19 DIAGNOSIS — I4819 Other persistent atrial fibrillation: Secondary | ICD-10-CM | POA: Diagnosis not present

## 2020-02-19 DIAGNOSIS — M7989 Other specified soft tissue disorders: Secondary | ICD-10-CM

## 2020-02-19 DIAGNOSIS — Z09 Encounter for follow-up examination after completed treatment for conditions other than malignant neoplasm: Secondary | ICD-10-CM | POA: Diagnosis not present

## 2020-02-19 DIAGNOSIS — L03119 Cellulitis of unspecified part of limb: Secondary | ICD-10-CM

## 2020-02-19 DIAGNOSIS — I5020 Unspecified systolic (congestive) heart failure: Secondary | ICD-10-CM | POA: Diagnosis not present

## 2020-02-19 MED ORDER — CEPHALEXIN 500 MG PO CAPS: 500.0000 mg | ORAL_CAPSULE | Freq: Three times a day (TID) | ORAL | 0 refills | Status: AC

## 2020-02-19 NOTE — Progress Notes (Signed)
Established Patient Office Visit  Subjective:  Patient ID: Bernard Ramirez, male    DOB: 02-Jun-1931  Age: 86 y.o. MRN: [id number]  CC:  Chief Complaint  Patient presents with  . Cellulitis    follow up    HPI Bernard Ramirez is a 86 year old male accompanied by his wife presenting for cellulitis follow up of BLE. He has continue to take 80mg  Lasix in AM and PM, Keflex as prescribed, however he did not purchase SCD, compression hose, or change his wrapping since the visit as instructed. He is drinking at least 2 sodas (mountain dew a day) his wife says non compliant with diet and drinking lemonade and other things he should not do and he is very stubborn doing what he likes.   Time discussing CHF, cellulitis, BLE management again with pt and his wife and pt does seem compelled to stay out of the hospital .  Discussed IM Rocephin today however not available in clinic will cover with 5 additional days of extended Keflex orally and follow up on Tuesday.  No fever, chills, cp, ct, gu/gi sxs.   Past Medical History:  Diagnosis Date  . Anemia    04/2012: H&H-10.2/31.5, MCV-77; 06/2012:12/38.9  . Arthritis   . Benign prostatic hypertrophy    s/p transurethral resection of the prostate  . Chest pain    2004; with dyspnea, stress nuclear-normal EF + questionable inferior wall ischemia, normal coronary angiography;  2010-negative stress nuclear  . CVA (cerebral infarction)    asymptomatic (seen on CT)  . Diarrhea    h/o Hemoccult-positive stool  . Dysrhythmia   . Edema of both legs   . GERD (gastroesophageal reflux disease)   . History of blood transfusion   . History of kidney stones   . History of urinary tract infection   . Hypertension   . Jerking movements of extremities    left arm   . Low back pain   . Obstructive sleep apnea    pt states can not use the CPAP  . Peripheral neuropathy    lower legs and feet bilat   . Persistent atrial fibrillation ([place]) 2013    Asymptomatic; diagnosed in 05/2012  . Pneumonia   . Prostate cancer ([place]) 2017   stage 4  . Shortness of breath dyspnea    pt states only develops SOB if tries to do something too quickly  . Upper GI bleed 1985   1985-peptic ulcer disease; initial hemigastrectomy; subsequent Billroth II  . Urinary hesitancy   . Vertigo    ED evaluation-06/2012    Past Surgical History:  Procedure Laterality Date  . Billroth II    . CHOLECYSTECTOMY    . COLONOSCOPY  Approximately 2000  . COLONOSCOPY  10/2012   Colonoscopy March 2014 at [facility], difficulty exam requiring fluoroscopy and overtube. Patient developed severe bradycardia again during his procedure like he did [facility] Multiple polyps removed which were tubular adenomas. Previously tattooed sites at 20 and 30 cm were free of any polypoid change. Left-sided diverticulosis noted.  . COLONOSCOPY WITH ESOPHAGOGASTRODUODENOSCOPY (EGD)  06-17-2009   YOV:ZCHYIFOY tubular adenomas and 2 tubulovillous adenomas, incomplete, 3 clips placed  . ESOPHAGOGASTRODUODENOSCOPY  06/17/2009   SLF: normal/chronic gastritis (non-h pylori)  . ESOPHAGOGASTRODUODENOSCOPY N/A 05/11/2015   DXA:JOINOMV anemai due to postgastrectomy state/moderate erosive gastritis  . INGUINAL HERNIA REPAIR     Left  . ROTATOR CUFF REPAIR  1991   Bilateral  . TRANSURETHRAL RESECTION OF PROSTATE  06/2016  . TRANSURETHRAL RESECTION OF PROSTATE N/A 12/27/2012   Procedure: TRANSURETHRAL RESECTION OF THE PROSTATE (TURP);  Surgeon: Marissa Nestle, MD;  Location: [facility];  Service: Urology;  Laterality: N/A;  . TRANSURETHRAL RESECTION OF PROSTATE N/A 04/11/2016   Procedure: TRANSURETHRAL RESECTION OF THE PROSTATE (TURP);  Surgeon: Irine Seal, MD;  Location: [facility];  Service: Urology;  Laterality: N/A;  . VAGOTOMY AND PYLOROPLASTY  1970s   Details of procedure uncertain; [id number]    Family History  Problem Relation Age of Onset  . Diabetes Mellitus II Mother   . Allergic rhinitis  Mother   . Lung disease Father   . Allergic rhinitis Father   . Angioedema Neg Hx   . Asthma Neg Hx   . Atopy Neg Hx   . Eczema Neg Hx   . Immunodeficiency Neg Hx   . Urticaria Neg Hx   . Cystic fibrosis Neg Hx   . Lupus Neg Hx   . Emphysema Neg Hx   . Migraines Neg Hx     Social History   Socioeconomic History  . Marital status: Married    Spouse name: Not on file  . Number of children: 5  . Years of education: Not on file  . Highest education level: Not on file  Occupational History  . Occupation: Retired Pensions consultant: RETIRED  Tobacco Use  . Smoking status: Former Smoker    Years: 12.00    Types: Cigars    Quit date: 08/22/2003    Years since quitting: 16.5  . Smokeless tobacco: Never Used  . Tobacco comment: Quit many years ago; few cigars per day; didn't inhale  Vaping Use  . Vaping Use: Never used  Substance and Sexual Activity  . Alcohol use: No    Alcohol/week: 1.0 standard drink    Types: 1 Standard drinks or equivalent per week  . Drug use: No  . Sexual activity: Not on file  Other Topics Concern  . Not on file  Social History Narrative   Lives w/ wife in [place]   Retired truck Geophysicist/field seismologist   Social Determinants of Health   Financial Resource Strain: [place]   . Difficulty of Paying Living Expenses: Not hard at all  Food Insecurity:   . Worried About Charity fundraiser in the Last Year:   . Arboriculturist in the Last Year:   Transportation Needs:   . Film/video editor (Medical):   Marland Kitchen Lack of Transportation (Non-Medical):   Physical Activity:   . Days of Exercise per Week:   . Minutes of Exercise per Session:   Stress:   . Feeling of Stress :   Social Connections:   . Frequency of Communication with Friends and Family:   . Frequency of Social Gatherings with Friends and Family:   . Attends Religious Services:   . Active Member of Clubs or Organizations:   . Attends Archivist Meetings:   Marland Kitchen Marital Status:     Intimate Partner Violence:   . Fear of Current or Ex-Partner:   . Emotionally Abused:   Marland Kitchen Physically Abused:   . Sexually Abused:     Outpatient Medications Prior to Visit  Medication Sig Dispense Refill  . clobetasol cream (TEMOVATE) 4.82 % Apply 1 application topically daily as needed (use as directed for skin).     Marland Kitchen clopidogrel (PLAVIX) 75 MG tablet Take 1 tablet (75 mg total) by mouth daily. 90 tablet 3  .  furosemide (LASIX) 40 MG tablet TAKE 1.5 TABLET BY MOUTH EVERY MORNING THEN 1 TABLET BY MOUTH EVERY AFTERNOON (Patient taking differently: TAKE 2 TABLET BY MOUTH EVERY MORNING THEN 1 TABLET BY MOUTH EVERY AFTERNOON) 225 tablet 1  . HYDROcodone-acetaminophen (NORCO) 10-325 MG tablet TAKE 1 TABLET  FIVE TIMES DAILY AS NEEDED FOR PAIN 150 tablet 0  . hydrOXYzine (ATARAX/VISTARIL) 50 MG tablet Take 1 tablet (50 mg total) by mouth every 6 (six) hours as needed for itching. 30 tablet 0  . losartan (COZAAR) 100 MG tablet Take 1 tablet (100 mg total) by mouth daily. 90 tablet 2  . meclizine (ANTIVERT) 25 MG tablet Take 25 mg by mouth at bedtime.     Marland Kitchen nystatin (MYCOSTATIN/NYSTOP) 100000 UNIT/GM POWD Apply 1 g topically daily as needed (rash).     . pantoprazole (PROTONIX) 40 MG tablet Take 1 tablet (40 mg total) by mouth daily. 90 tablet 2  . potassium chloride SA (K-DUR,KLOR-CON) 20 MEQ tablet Take 1 tablet (20 mEq total) by mouth daily. 45 tablet 6  . pravastatin (PRAVACHOL) 20 MG tablet TAKE 1 TABLET(20 MG) BY MOUTH DAILY 90 tablet 2  . triamcinolone cream (KENALOG) 0.1 % Apply 1 application topically daily as needed (for irritation).     . cephALEXin (KEFLEX) 500 MG capsule Take 1 capsule (500 mg total) by mouth 3 (three) times daily. 21 capsule 0   No facility-administered medications prior to visit.    Allergies  Allergen Reactions  . Contrast Media [Iodinated Diagnostic Agents]     Pt states he was given "dye" 20 yrs ago, anaphylaxis & syncope - was told to never have it again    . Gabapentin Itching  . Aspirin Other (See Comments)    Blistering   . Latex Rash  . Lipitor [Atorvastatin] Itching  . Tape Rash    EKG leads    ROS Review of Systems  All other systems reviewed and are negative.     Objective:    Physical Exam Vitals and nursing note reviewed.  Constitutional:      Appearance: Normal appearance. He is obese. He is not ill-appearing or diaphoretic.  HENT:     Head: Normocephalic.  Eyes:     Extraocular Movements: Extraocular movements intact.     Conjunctiva/sclera: Conjunctivae normal.     Pupils: Pupils are equal, round, and reactive to light.  Neck:     Vascular: No carotid bruit.  Cardiovascular:     Rate and Rhythm: Normal rate.     Pulses: Normal pulses.  Pulmonary:     Effort: Pulmonary effort is normal.  Abdominal:     General: There is no distension.     Palpations: Abdomen is soft.  Musculoskeletal:     Cervical back: Normal range of motion.     Right lower leg: Edema present.     Left lower leg: Edema present.       Legs:     Comments: Erythema has increased in area since last visit, edema continue from 5-6 inches below knee to toes.  Skin:    General: Skin is warm and dry.     Coloration: Skin is not jaundiced or pale.          Comments: Left inner lower calf with skin lesion scabbed over non draining non erythema non edematous area no foul odor.  Right mid shin with two 1 cm  Round two new clear fluid filled lesion   Neurological:     General: No  focal deficit present.     Mental Status: He is alert and oriented to person, place, and time.  Psychiatric:        Mood and Affect: Mood normal.        Behavior: Behavior normal.        Thought Content: Thought content normal.        Judgment: Judgment normal.     BP 120/80 (BP Location: Left Arm, Patient Position: Sitting, Cuff Size: Large)   Pulse 77   Temp 99 F (37.2 C) (Temporal)   Resp 18   Wt 260 lb 3.2 oz (118 kg)   SpO2 94%   BMI 34.33 kg/m  Wt  Readings from Last 3 Encounters:  02/19/20 260 lb 3.2 oz (118 kg)  02/16/20 261 lb (118.4 kg)  02/13/20 260 lb (117.9 kg)     There are no preventive care reminders to display for this patient.  There are no preventive care reminders to display for this patient.   Lab Results  Component Value Date   WBC 8.8 09/10/2019   HGB 12.8 (L) 09/10/2019   HCT 39.8 09/10/2019   MCV 86.7 09/10/2019   PLT 217 09/10/2019   Lab Results  Component Value Date   NA 140 09/10/2019   K 5.0 09/10/2019   CO2 26 09/10/2019   GLUCOSE 143 (H) 09/10/2019   BUN 18 09/10/2019   CREATININE 1.06 09/10/2019   BILITOT 0.9 09/10/2019   ALKPHOS 88 06/24/2012   AST 14 09/10/2019   ALT 7 (L) 09/10/2019   PROT 6.6 09/10/2019   ALBUMIN 3.8 10/09/2016   CALCIUM 9.6 09/10/2019   ANIONGAP 13 10/11/2016   Lab Results  Component Value Date   CHOL 168 09/10/2019   Lab Results  Component Value Date   HDL 46 09/10/2019   Lab Results  Component Value Date   LDLCALC 101 (H) 09/10/2019   Lab Results  Component Value Date   TRIG 114 09/10/2019   Lab Results  Component Value Date   CHOLHDL 3.7 09/10/2019   No results found for: HGBA1C    Assessment & Plan:   Problem List Items Addressed This Visit      Cardiovascular and Mediastinum   Atrial fibrillation ([place]) - Primary   Congestive heart failure (CHF) (HCC)     Other   Cellulitis of lower extremity   Relevant Medications   cephALEXin (KEFLEX) 500 MG capsule    Other Visit Diagnoses    Leg swelling       Follow up        Your sxs have worsened and you should continue the antibiotic that you will take the last dose of tonight and start new prescription stating tomorrow that has been escribed today following up Tuesday -Continue to take your daily medications as prescribed to include 80mg  Lasix in AM and in PM as you stated your PCP instructed you to take  -You should monitor daily for CHF fluids exacerbation such as weight gain of 5 lbs  over 3 days or less from dry weight (1st weight in AM prior to eating and after voiding/evacuating) , 3 days of increased cough, sob, swelling, these sxs should be reported to your provider promptly.   You should follow:  2 Gram sodium diet restriction in 24 hour  1500 ml fluid restriction over 24 hour  Elevated lower extremities while resting and often to reduce edema  Wear compression device such as hose, Sequential compression device, wrap as in clinic to  reduce edema  Avoid sodas  Take medication as prescribed  Meds ordered this encounter  Medications  . cephALEXin (KEFLEX) 500 MG capsule    Sig: Take 1 capsule (500 mg total) by mouth 3 (three) times daily for 5 days. Start tomorrow when you have completed current prescription.    Dispense:  15 capsule    Refill:  0    Follow-up: Return Tuesday with Dr Dennard Schaumann for cellulitis BLE.    Annie Main, FNP

## 2020-02-24 ENCOUNTER — Ambulatory Visit (INDEPENDENT_AMBULATORY_CARE_PROVIDER_SITE_OTHER): Payer: PPO | Admitting: Family Medicine

## 2020-02-24 ENCOUNTER — Other Ambulatory Visit: Payer: Self-pay

## 2020-02-24 VITALS — BP 130/80 | HR 76 | Temp 98.0°F | Wt 256.0 lb

## 2020-02-24 DIAGNOSIS — R739 Hyperglycemia, unspecified: Secondary | ICD-10-CM

## 2020-02-24 DIAGNOSIS — L03119 Cellulitis of unspecified part of limb: Secondary | ICD-10-CM

## 2020-02-24 DIAGNOSIS — M7989 Other specified soft tissue disorders: Secondary | ICD-10-CM | POA: Diagnosis not present

## 2020-02-24 DIAGNOSIS — I5020 Unspecified systolic (congestive) heart failure: Secondary | ICD-10-CM

## 2020-02-24 DIAGNOSIS — I4819 Other persistent atrial fibrillation: Secondary | ICD-10-CM | POA: Diagnosis not present

## 2020-02-24 MED ORDER — METOLAZONE 2.5 MG PO TABS: 2.5000 mg | ORAL_TABLET | ORAL | 1 refills | Status: DC

## 2020-02-24 MED ORDER — HYDROCODONE-ACETAMINOPHEN 10-325 MG PO TABS: ORAL_TABLET | ORAL | 0 refills | Status: DC

## 2020-02-24 NOTE — Progress Notes (Signed)
Subjective:    Patient ID: Bernard Ramirez, male    DOB: Oct 02, 1930, 86 y.o.   MRN: [id number]  10/20 Patient is a very pleasant 86 year old male here today to establish care.  Patient was previously under the care of Dr. Sinda Du who has retired.  He is transferring his care to this office.  Patient's past medical history is significant for persistent atrial fibrillation.  However he is on no oral anticoagulant due to a history of a GI bleed.  This is well documented in his chart.  He is on Plavix however given history of a stroke in 2017.  MRI at that time revealed an acute left occipital infarct.  Patient also had a prostate biopsy performed in 2017 which revealed prostate adenocarcinoma with a Gleason score of 9.  Bone scan at that time revealed a possible increased uptake in the left sixth rib as well as in the right sternum.  However patient declined treatment and has not seen urology since that time.  Patient is also on hydrocodone/acetaminophen 10/325.  He takes 5 tablets a day.  He has been getting 150 tablets every month under the care of his primary care physician.  I reviewed the drug database and it is consistent that he has been receiving 150 tablets monthly from Dr. Sinda Du.  He is not receiving any other pain medication from any other doctor.  The patient is unable to tolerate NSAIDs for his chronic arthritic pain in his knees and in his lower back due to his history of GI bleeding.    02/13/20 Wt Readings from Last 3 Encounters:  02/19/20 260 lb 3.2 oz (118 kg)  02/16/20 261 lb (118.4 kg)  02/13/20 260 lb (117.9 kg)   Patient is currently taking 2 Lasix pills in the morning and 1 Lasix pill in the evening.  Both legs have swollen substantially distal to the knee.  He has +2 pitting edema in his left leg and +1 to +2 pitting edema in the right leg.  The skin of the left leg is erythematous warm and painful from his ankle to his knee circumferentially around the leg.   Patient reports pain in both legs due to the swelling and distention.  AT that time, my plan was: Increase Lasix to 2 tablets twice daily through the weekend and then recheck here on Monday.  The patient was placed in Unna boots bilaterally and I will start the patient on Keflex 500 mg 3 times daily for 7 days for secondary cellulitis in the left leg.  Recheck with my partner on Monday.  02/24/20 Saw my partner last week.  Weight essentially remained the same at 260 pounds despite wearing Unna boots and increasing his Lasix dose.  Patient is still taking 80 mg twice a day.  He denies any shortness of breath.  I remove the Ace wraps today from his legs.  He still has +2 pitting edema.  However the erythema has faded from both legs.  Therefore I believe the cellulitis has resolved.  He is not wearing any compression hose yet.  His wife has ordered them. Past Medical History:  Diagnosis Date  . Anemia    04/2012: H&H-10.2/31.5, MCV-77; 06/2012:12/38.9  . Arthritis   . Benign prostatic hypertrophy    s/p transurethral resection of the prostate  . Chest pain    2004; with dyspnea, stress nuclear-normal EF + questionable inferior wall ischemia, normal coronary angiography;  2010-negative stress nuclear  . CVA (cerebral infarction)  asymptomatic (seen on CT)  . Diarrhea    h/o Hemoccult-positive stool  . Dysrhythmia   . Edema of both legs   . GERD (gastroesophageal reflux disease)   . History of blood transfusion   . History of kidney stones   . History of urinary tract infection   . Hypertension   . Jerking movements of extremities    left arm   . Low back pain   . Obstructive sleep apnea    pt states can not use the CPAP  . Peripheral neuropathy    lower legs and feet bilat   . Persistent atrial fibrillation ([place]) 2013   Asymptomatic; diagnosed in 05/2012  . Pneumonia   . Prostate cancer ([place]) 2017   stage 4  . Shortness of breath dyspnea    pt states only develops SOB if tries to do  something too quickly  . Upper GI bleed 1985   1985-peptic ulcer disease; initial hemigastrectomy; subsequent Billroth II  . Urinary hesitancy   . Vertigo    ED evaluation-06/2012   Past Surgical History:  Procedure Laterality Date  . Billroth II    . CHOLECYSTECTOMY    . COLONOSCOPY  Approximately 2000  . COLONOSCOPY  10/2012   Colonoscopy March 2014 at [facility], difficulty exam requiring fluoroscopy and overtube. Patient developed severe bradycardia again during his procedure like he did [facility]. Multiple polyps removed which were tubular adenomas. Previously tattooed sites at 20 and 30 cm were free of any polypoid change. Left-sided diverticulosis noted.  . COLONOSCOPY WITH ESOPHAGOGASTRODUODENOSCOPY (EGD)  06-17-2009   [id number] tubular adenomas and 2 tubulovillous adenomas, incomplete, 3 clips placed  . ESOPHAGOGASTRODUODENOSCOPY  06/17/2009   SLF: normal/chronic gastritis (non-h pylori)  . ESOPHAGOGASTRODUODENOSCOPY N/A 05/11/2015   JWJ:[id number] anemai due to postgastrectomy state/moderate erosive gastritis  . INGUINAL HERNIA REPAIR     Left  . ROTATOR CUFF REPAIR  1991   Bilateral  . TRANSURETHRAL RESECTION OF PROSTATE  06/2016  . TRANSURETHRAL RESECTION OF PROSTATE N/A 12/27/2012   Procedure: TRANSURETHRAL RESECTION OF THE PROSTATE (TURP);  Surgeon: Marissa Nestle, MD;  Location: [facility];  Service: Urology;  Laterality: N/A;  . TRANSURETHRAL RESECTION OF PROSTATE N/A 04/11/2016   Procedure: TRANSURETHRAL RESECTION OF THE PROSTATE (TURP);  Surgeon: Irine Seal, MD;  Location: [facility];  Service: Urology;  Laterality: N/A;  . VAGOTOMY AND PYLOROPLASTY  1970s   Details of procedure uncertain; 9562Z   Current Outpatient Medications on File Prior to Visit  Medication Sig Dispense Refill  . cephALEXin (KEFLEX) 500 MG capsule Take 1 capsule (500 mg total) by mouth 3 (three) times daily for 5 days. Start tomorrow when you have completed current prescription. 15 capsule 0  .  clobetasol cream (TEMOVATE) 3.08 % Apply 1 application topically daily as needed (use as directed for skin).     Marland Kitchen clopidogrel (PLAVIX) 75 MG tablet Take 1 tablet (75 mg total) by mouth daily. 90 tablet 3  . furosemide (LASIX) 40 MG tablet TAKE 1.5 TABLET BY MOUTH EVERY MORNING THEN 1 TABLET BY MOUTH EVERY AFTERNOON (Patient taking differently: TAKE 2 TABLET BY MOUTH EVERY MORNING THEN 1 TABLET BY MOUTH EVERY AFTERNOON) 225 tablet 1  . HYDROcodone-acetaminophen (NORCO) 10-325 MG tablet TAKE 1 TABLET  FIVE TIMES DAILY AS NEEDED FOR PAIN 150 tablet 0  . hydrOXYzine (ATARAX/VISTARIL) 50 MG tablet Take 1 tablet (50 mg total) by mouth every 6 (six) hours as needed for itching. 30 tablet 0  . losartan (COZAAR)  100 MG tablet Take 1 tablet (100 mg total) by mouth daily. 90 tablet 2  . meclizine (ANTIVERT) 25 MG tablet Take 25 mg by mouth at bedtime.     Marland Kitchen nystatin (MYCOSTATIN/NYSTOP) 100000 UNIT/GM POWD Apply 1 g topically daily as needed (rash).     . pantoprazole (PROTONIX) 40 MG tablet Take 1 tablet (40 mg total) by mouth daily. 90 tablet 2  . potassium chloride SA (K-DUR,KLOR-CON) 20 MEQ tablet Take 1 tablet (20 mEq total) by mouth daily. 45 tablet 6  . pravastatin (PRAVACHOL) 20 MG tablet TAKE 1 TABLET(20 MG) BY MOUTH DAILY 90 tablet 2  . triamcinolone cream (KENALOG) 0.1 % Apply 1 application topically daily as needed (for irritation).      No current facility-administered medications on file prior to visit.   Allergies  Allergen Reactions  . Contrast Media [Iodinated Diagnostic Agents]     Pt states he was given "dye" 20 yrs ago, anaphylaxis & syncope - was told to never have it again   . Gabapentin Itching  . Aspirin Other (See Comments)    Blistering   . Latex Rash  . Lipitor [Atorvastatin] Itching  . Tape Rash    EKG leads   Social History   Socioeconomic History  . Marital status: Married    Spouse name: Not on file  . Number of children: 5  . Years of education: Not on file  .  Highest education level: Not on file  Occupational History  . Occupation: Retired Pensions consultant: RETIRED  Tobacco Use  . Smoking status: Former Smoker    Years: 12.00    Types: Cigars    Quit date: 08/22/2003    Years since quitting: 16.5  . Smokeless tobacco: Never Used  . Tobacco comment: Quit many years ago; few cigars per day; didn't inhale  Vaping Use  . Vaping Use: Never used  Substance and Sexual Activity  . Alcohol use: No    Alcohol/week: 1.0 standard drink    Types: 1 Standard drinks or equivalent per week  . Drug use: No  . Sexual activity: Not on file  Other Topics Concern  . Not on file  Social History Narrative   Lives w/ wife in [place]   Retired truck Geophysicist/field seismologist   Social Determinants of Health   Financial Resource Strain: [place]   . Difficulty of Paying Living Expenses: Not hard at all  Food Insecurity:   . Worried About Charity fundraiser in the Last Year:   . Arboriculturist in the Last Year:   Transportation Needs:   . Film/video editor (Medical):   Marland Kitchen Lack of Transportation (Non-Medical):   Physical Activity:   . Days of Exercise per Week:   . Minutes of Exercise per Session:   Stress:   . Feeling of Stress :   Social Connections:   . Frequency of Communication with Friends and Family:   . Frequency of Social Gatherings with Friends and Family:   . Attends Religious Services:   . Active Member of Clubs or Organizations:   . Attends Archivist Meetings:   Marland Kitchen Marital Status:   Intimate Partner Violence:   . Fear of Current or Ex-Partner:   . Emotionally Abused:   Marland Kitchen Physically Abused:   . Sexually Abused:       Review of Systems  All other systems reviewed and are negative.      Objective:   Physical Exam  Vitals reviewed.  Constitutional:      General: He is not in acute distress.    Appearance: Normal appearance. He is obese. He is not ill-appearing or toxic-appearing.  Cardiovascular:     Rate and  Rhythm: Normal rate. Rhythm irregular.     Heart sounds: Normal heart sounds. No murmur heard.  No friction rub. No gallop.   Pulmonary:     Effort: Pulmonary effort is normal. No respiratory distress.     Breath sounds: Normal breath sounds. No stridor. No wheezing, rhonchi or rales.  Abdominal:     General: Abdomen is flat. Bowel sounds are normal. There is no distension.     Palpations: Abdomen is soft. There is no mass.     Tenderness: There is no abdominal tenderness. There is no guarding.  Musculoskeletal:     Right lower leg: 2+ Edema present.     Left lower leg: 2+ Edema present.     Right foot: Decreased range of motion. Swelling and deformity present.  Skin:    Findings: No erythema.  Neurological:     General: No focal deficit present.     Mental Status: He is alert and oriented to person, place, and time.     Cranial Nerves: No cranial nerve deficit.     Sensory: No sensory deficit.     Motor: No weakness.     Coordination: Coordination normal.     Gait: Gait abnormal (Shuffling gait.  Requires a cane to ambulate).     Deep Tendon Reflexes: Reflexes normal.  Psychiatric:        Mood and Affect: Mood normal.        Thought Content: Thought content normal.        Judgment: Judgment normal.           Assessment & Plan:  Persistent atrial fibrillation (HCC)  Leg swelling  Systolic congestive heart failure, unspecified HF chronicity (HCC)  Cellulitis of lower extremity, unspecified laterality  Elevated blood sugar - Plan: Brain natriuretic peptide, BASIC METABOLIC PANEL WITH GFR, Hemoglobin A1c  Check a BMP and a BNP today to monitor potassium and renal status.  On his last lab work, his sugar was elevated and therefore I will check an A1c as well.  Continue Lasix 80 mg twice daily.  However we will add Zaroxolyn 2.5 mg 30 minutes prior to his Lasix.  We will do this once a week to try to affect better diuresis.  Strongly encouraged that he elevate his legs and  also wear compression hose.  Cellulitis has resolved.

## 2020-02-25 LAB — BASIC METABOLIC PANEL WITH GFR
BUN/Creatinine Ratio: 13 (calc) (ref 6–22)
BUN: 17 mg/dL (ref 7–25)
CO2: 30 mmol/L (ref 20–32)
Calcium: 9.3 mg/dL (ref 8.6–10.3)
Chloride: 101 mmol/L (ref 98–110)
Creat: 1.33 mg/dL — ABNORMAL HIGH (ref 0.70–1.11)
GFR, Est African American: 55 mL/min/{1.73_m2} — ABNORMAL LOW (ref >=60)
GFR, Est Non African American: 48 mL/min/{1.73_m2} — ABNORMAL LOW (ref >=60)
Glucose, Bld: 153 mg/dL — ABNORMAL HIGH (ref 65–99)
Potassium: 4.3 mmol/L (ref 3.5–5.3)
Sodium: 141 mmol/L (ref 135–146)

## 2020-02-25 LAB — HEMOGLOBIN A1C
Hgb A1c MFr Bld: 6.7 % of total Hgb — ABNORMAL HIGH (ref <5.7)
Mean Plasma Glucose: 146 (calc)
eAG (mmol/L): 8.1 (calc)

## 2020-02-25 LAB — BRAIN NATRIURETIC PEPTIDE: Brain Natriuretic Peptide: 44 pg/mL (ref <100)

## 2020-03-09 ENCOUNTER — Encounter: Payer: Self-pay | Admitting: Family Medicine

## 2020-03-09 ENCOUNTER — Ambulatory Visit (INDEPENDENT_AMBULATORY_CARE_PROVIDER_SITE_OTHER): Payer: PPO | Admitting: Family Medicine

## 2020-03-09 ENCOUNTER — Other Ambulatory Visit: Payer: Self-pay

## 2020-03-09 VITALS — BP 134/74 | HR 56 | Temp 98.7°F | Resp 16 | Ht 73.0 in | Wt 258.0 lb

## 2020-03-09 DIAGNOSIS — I4819 Other persistent atrial fibrillation: Secondary | ICD-10-CM | POA: Diagnosis not present

## 2020-03-09 DIAGNOSIS — I5020 Unspecified systolic (congestive) heart failure: Secondary | ICD-10-CM | POA: Diagnosis not present

## 2020-03-09 DIAGNOSIS — M7989 Other specified soft tissue disorders: Secondary | ICD-10-CM | POA: Diagnosis not present

## 2020-03-09 NOTE — Progress Notes (Signed)
Subjective:    Patient ID: Bernard Ramirez, male    DOB: [birth year], 86 y.o.   MRN: [id number]  10/20 Patient is a very pleasant 86 year old male here today to establish care.  Patient was previously under the care of Dr. Sinda Du who has retired.  He is transferring his care to this office.  Patient's past medical history is significant for persistent atrial fibrillation.  However he is on no oral anticoagulant due to a history of a GI bleed.  This is well documented in his chart.  He is on Plavix however given history of a stroke in 2017.  MRI at that time revealed an acute left occipital infarct.  Patient also had a prostate biopsy performed in 2017 which revealed prostate adenocarcinoma with a Gleason score of 9.  Bone scan at that time revealed a possible increased uptake in the left sixth rib as well as in the right sternum.  However patient declined treatment and has not seen urology since that time.  Patient is also on hydrocodone/acetaminophen 10/325.  He takes 5 tablets a day.  He has been getting 150 tablets every month under the care of his primary care physician.  I reviewed the drug database and it is consistent that he has been receiving 150 tablets monthly from Dr. Sinda Du.  He is not receiving any other pain medication from any other doctor.  The patient is unable to tolerate NSAIDs for his chronic arthritic pain in his knees and in his lower back due to his history of GI bleeding.    02/13/20 Wt Readings from Last 3 Encounters:  02/24/20 256 lb (116.1 kg)  02/19/20 260 lb 3.2 oz (118 kg)  02/16/20 261 lb (118.4 kg)   Patient is currently taking 2 Lasix pills in the morning and 1 Lasix pill in the evening.  Both legs have swollen substantially distal to the knee.  He has +2 pitting edema in his left leg and +1 to +2 pitting edema in the right leg.  The skin of the left leg is erythematous warm and painful from his ankle to his knee circumferentially around the leg.   Patient reports pain in both legs due to the swelling and distention.  AT that time, my plan was: Increase Lasix to 2 tablets twice daily through the weekend and then recheck here on Monday.  The patient was placed in Unna boots bilaterally and I will start the patient on Keflex 500 mg 3 times daily for 7 days for secondary cellulitis in the left leg.  Recheck with my partner on Monday.  02/24/20 Saw my partner last week.  Weight essentially remained the same at 260 pounds despite wearing Unna boots and increasing his Lasix dose.  Patient is still taking 80 mg twice a day.  He denies any shortness of breath.  I remove the Ace wraps today from his legs.  He still has +2 pitting edema.  However the erythema has faded from both legs.  Therefore I believe the cellulitis has resolved.  He is not wearing any compression hose yet.  His wife has ordered them.  At that time, my plan was: Check a BMP and a BNP today to monitor potassium and renal status.  On his last lab work, his sugar was elevated and therefore I will check an A1c as well.  Continue Lasix 80 mg twice daily.  However we will add Zaroxolyn 2.5 mg 30 minutes prior to his Lasix.  We will do  this once a week to try to affect better diuresis.  Strongly encouraged that he elevate his legs and also wear compression hose.  Cellulitis has resolved.  03/09/20 A1c was acceptable at 6.7 at last visit.  Weight virtually the same from the last visit.  Continues to have significant edema in both legs distal to the knee.  He has +2 pitting edema in the dorsum of his left foot.  He has +2 pitting edema in his left leg.  He has +1 edema in the right foot and +2 edema in the right leg.  There is some mild erythema in both shins consistent with inflammation due to the swelling.  He denies any chest pain shortness of breath or dyspnea on exertion.  He is unable to wear compression hose.  He is unable to put them on.  We cannot try the Zaroxolyn due to the fact his renal  function deteriorated at his last visit and suggested prerenal azotemia  Past Medical History:  Diagnosis Date  . Anemia    04/2012: H&H-10.2/31.5, MCV-77; 06/2012:12/38.9  . Arthritis   . Benign prostatic hypertrophy    s/p transurethral resection of the prostate  . Chest pain    2004; with dyspnea, stress nuclear-normal EF + questionable inferior wall ischemia, normal coronary angiography;  2010-negative stress nuclear  . CVA (cerebral infarction)    asymptomatic (seen on CT)  . Diarrhea    h/o Hemoccult-positive stool  . Dysrhythmia   . Edema of both legs   . GERD (gastroesophageal reflux disease)   . History of blood transfusion   . History of kidney stones   . History of urinary tract infection   . Hypertension   . Jerking movements of extremities    left arm   . Low back pain   . Obstructive sleep apnea    pt states can not use the CPAP  . Peripheral neuropathy    lower legs and feet bilat   . Persistent atrial fibrillation ([place]) 2013   Asymptomatic; diagnosed in 05/2012  . Pneumonia   . Prostate cancer ([place]) 2017   stage 4  . Shortness of breath dyspnea    pt states only develops SOB if tries to do something too quickly  . Upper GI bleed 1985   1985-peptic ulcer disease; initial hemigastrectomy; subsequent Billroth II  . Urinary hesitancy   . Vertigo    ED evaluation-06/2012   Past Surgical History:  Procedure Laterality Date  . Billroth II    . CHOLECYSTECTOMY    . COLONOSCOPY  Approximately 2000  . COLONOSCOPY  10/2012   Colonoscopy March 2014 at [facility], difficulty exam requiring fluoroscopy and overtube. Patient developed severe bradycardia again during his procedure like he did [facility]. Multiple polyps removed which were tubular adenomas. Previously tattooed sites at 20 and 30 cm were free of any polypoid change. Left-sided diverticulosis noted.  . COLONOSCOPY WITH ESOPHAGOGASTRODUODENOSCOPY (EGD)  06-17-2009   MV[facility]:QIONGEXB tubular adenomas and 2  tubulovillous adenomas, incomplete, 3 clips placed  . ESOPHAGOGASTRODUODENOSCOPY  06/17/2009   SLF: normal/chronic gastritis (non-h pylori)  . ESOPHAGOGASTRODUODENOSCOPY N/A 05/11/2015   MWU:[id number] anemai due to postgastrectomy state/moderate erosive gastritis  . INGUINAL HERNIA REPAIR     Left  . ROTATOR CUFF REPAIR  1991   Bilateral  . TRANSURETHRAL RESECTION OF PROSTATE  06/2016  . TRANSURETHRAL RESECTION OF PROSTATE N/A 12/27/2012   Procedure: TRANSURETHRAL RESECTION OF THE PROSTATE (TURP);  Surgeon: Marissa Nestle, MD;  Location: [facility];  Service: Urology;  Laterality: N/A;  . TRANSURETHRAL RESECTION OF PROSTATE N/A 04/11/2016   Procedure: TRANSURETHRAL RESECTION OF THE PROSTATE (TURP);  Surgeon: Irine Seal, MD;  Location: [facility];  Service: Urology;  Laterality: N/A;  . VAGOTOMY AND PYLOROPLASTY  1970s   Details of procedure uncertain; 0923R   Current Outpatient Medications on File Prior to Visit  Medication Sig Dispense Refill  . clobetasol cream (TEMOVATE) 0.07 % Apply 1 application topically daily as needed (use as directed for skin).     Marland Kitchen clopidogrel (PLAVIX) 75 MG tablet Take 1 tablet (75 mg total) by mouth daily. 90 tablet 3  . furosemide (LASIX) 40 MG tablet TAKE 1.5 TABLET BY MOUTH EVERY MORNING THEN 1 TABLET BY MOUTH EVERY AFTERNOON (Patient taking differently: TAKE 2 TABLET BY MOUTH EVERY MORNING THEN 1 TABLET BY MOUTH EVERY AFTERNOON) 225 tablet 1  . HYDROcodone-acetaminophen (NORCO) 10-325 MG tablet TAKE 1 TABLET  FIVE TIMES DAILY AS NEEDED FOR PAIN 150 tablet 0  . hydrOXYzine (ATARAX/VISTARIL) 50 MG tablet Take 1 tablet (50 mg total) by mouth every 6 (six) hours as needed for itching. 30 tablet 0  . losartan (COZAAR) 100 MG tablet Take 1 tablet (100 mg total) by mouth daily. 90 tablet 2  . meclizine (ANTIVERT) 25 MG tablet Take 25 mg by mouth at bedtime.     . metolazone (ZAROXOLYN) 2.5 MG tablet Take 1 tablet (2.5 mg total) by mouth once a week. Take once a week 30  minutes before furosemide 30 tablet 1  . nystatin (MYCOSTATIN/NYSTOP) 100000 UNIT/GM POWD Apply 1 g topically daily as needed (rash).     . pantoprazole (PROTONIX) 40 MG tablet Take 1 tablet (40 mg total) by mouth daily. 90 tablet 2  . potassium chloride SA (K-DUR,KLOR-CON) 20 MEQ tablet Take 1 tablet (20 mEq total) by mouth daily. 45 tablet 6  . pravastatin (PRAVACHOL) 20 MG tablet TAKE 1 TABLET(20 MG) BY MOUTH DAILY 90 tablet 2  . triamcinolone cream (KENALOG) 0.1 % Apply 1 application topically daily as needed (for irritation).      No current facility-administered medications on file prior to visit.   Allergies  Allergen Reactions  . Contrast Media [Iodinated Diagnostic Agents]     Pt states he was given "dye" 20 yrs ago, anaphylaxis & syncope - was told to never have it again   . Gabapentin Itching  . Aspirin Other (See Comments)    Blistering   . Latex Rash  . Lipitor [Atorvastatin] Itching  . Tape Rash    EKG leads   Social History   Socioeconomic History  . Marital status: Married    Spouse name: Not on file  . Number of children: 5  . Years of education: Not on file  . Highest education level: Not on file  Occupational History  . Occupation: Retired Pensions consultant: RETIRED  Tobacco Use  . Smoking status: Former Smoker    Years: 12.00    Types: Cigars    Quit date: 08/22/2003    Years since quitting: 16.5  . Smokeless tobacco: Never Used  . Tobacco comment: Quit many years ago; few cigars per day; didn't inhale  Vaping Use  . Vaping Use: Never used  Substance and Sexual Activity  . Alcohol use: No    Alcohol/week: 1.0 standard drink    Types: 1 Standard drinks or equivalent per week  . Drug use: No  . Sexual activity: Not on file  Other Topics Concern  .  Not on file  Social History Narrative   Lives w/ wife in [place]   Retired truck Geophysicist/field seismologist   Social Determinants of Health   Financial Resource Strain: [place]   . Difficulty of Paying  Living Expenses: Not hard at all  Food Insecurity:   . Worried About Charity fundraiser in the Last Year:   . Arboriculturist in the Last Year:   Transportation Needs:   . Film/video editor (Medical):   Marland Kitchen Lack of Transportation (Non-Medical):   Physical Activity:   . Days of Exercise per Week:   . Minutes of Exercise per Session:   Stress:   . Feeling of Stress :   Social Connections:   . Frequency of Communication with Friends and Family:   . Frequency of Social Gatherings with Friends and Family:   . Attends Religious Services:   . Active Member of Clubs or Organizations:   . Attends Archivist Meetings:   Marland Kitchen Marital Status:   Intimate Partner Violence:   . Fear of Current or Ex-Partner:   . Emotionally Abused:   Marland Kitchen Physically Abused:   . Sexually Abused:       Review of Systems  All other systems reviewed and are negative.      Objective:   Physical Exam Vitals reviewed.  Constitutional:      General: He is not in acute distress.    Appearance: Normal appearance. He is obese. He is not ill-appearing or toxic-appearing.  Cardiovascular:     Rate and Rhythm: Normal rate. Rhythm irregular.     Heart sounds: Normal heart sounds. No murmur heard.  No friction rub. No gallop.   Pulmonary:     Effort: Pulmonary effort is normal. No respiratory distress.     Breath sounds: Normal breath sounds. No stridor. No wheezing, rhonchi or rales.  Abdominal:     General: Abdomen is flat. Bowel sounds are normal. There is no distension.     Palpations: Abdomen is soft. There is no mass.     Tenderness: There is no abdominal tenderness. There is no guarding.  Musculoskeletal:     Right lower leg: 2+ Edema present.     Left lower leg: 2+ Edema present.     Right foot: Decreased range of motion. Swelling and deformity present.  Skin:    Findings: No erythema.  Neurological:     General: No focal deficit present.     Mental Status: He is alert and oriented to  person, place, and time.     Cranial Nerves: No cranial nerve deficit.     Sensory: No sensory deficit.     Motor: No weakness.     Coordination: Coordination normal.     Gait: Gait abnormal (Shuffling gait.  Requires a cane to ambulate).     Deep Tendon Reflexes: Reflexes normal.  Psychiatric:        Mood and Affect: Mood normal.        Thought Content: Thought content normal.        Judgment: Judgment normal.           Assessment & Plan:  Leg swelling - Plan: BASIC METABOLIC PANEL WITH GFR, CBC with Differential/Platelet  Systolic congestive heart failure, unspecified HF chronicity ([place]) - Plan: BASIC METABOLIC PANEL WITH GFR, CBC with Differential/Platelet  Persistent atrial fibrillation ([place]) - Plan: CBC with Differential/Platelet  Patient continues to have significant pitting edema in both legs however he is unable to  compression hose.  He is already taking 80 mg of Lasix twice a day.  Higher doses have shown increasing in his creatinine and therefore we have avoided increased dose of diuretics.  At this point I believe we have accomplished the best we can.  I will treat the patient symptomatically.  If he develops increasing shortness of breath or evidence of fluid overload I will temporarily increase his Lasix however I believe 80 mg of Lasix twice daily is the highest dose he can tolerate without dehydration on a chronic basis

## 2020-03-10 LAB — CBC WITH DIFFERENTIAL/PLATELET
Absolute Monocytes: 637 cells/uL (ref 200–950)
Basophils Absolute: 7 cells/uL (ref 0–200)
Basophils Relative: 0.1 %
Eosinophils Absolute: 238 cells/uL (ref 15–500)
Eosinophils Relative: 3.4 %
HCT: 38.1 % — ABNORMAL LOW (ref 38.5–50.0)
Hemoglobin: 12 g/dL — ABNORMAL LOW (ref 13.2–17.1)
Lymphs Abs: 1190 cells/uL (ref 850–3900)
MCH: 27.4 pg (ref 27.0–33.0)
MCHC: 31.5 g/dL — ABNORMAL LOW (ref 32.0–36.0)
MCV: 87 fL (ref 80.0–100.0)
MPV: 11.5 fL (ref 7.5–12.5)
Monocytes Relative: 9.1 %
Neutro Abs: 4928 cells/uL (ref 1500–7800)
Neutrophils Relative %: 70.4 %
Platelets: 212 10*3/uL (ref 140–400)
RBC: 4.38 10*6/uL (ref 4.20–5.80)
RDW: 12.9 % (ref 11.0–15.0)
Total Lymphocyte: 17 %
WBC: 7 10*3/uL (ref 3.8–10.8)

## 2020-03-10 LAB — BASIC METABOLIC PANEL WITH GFR
BUN/Creatinine Ratio: 12 (calc) (ref 6–22)
BUN: 16 mg/dL (ref 7–25)
CO2: 27 mmol/L (ref 20–32)
Calcium: 9.2 mg/dL (ref 8.6–10.3)
Chloride: 101 mmol/L (ref 98–110)
Creat: 1.32 mg/dL — ABNORMAL HIGH (ref 0.70–1.11)
GFR, Est African American: 56 mL/min/{1.73_m2} — ABNORMAL LOW (ref >=60)
GFR, Est Non African American: 48 mL/min/{1.73_m2} — ABNORMAL LOW (ref >=60)
Glucose, Bld: 131 mg/dL — ABNORMAL HIGH (ref 65–99)
Potassium: 4.6 mmol/L (ref 3.5–5.3)
Sodium: 139 mmol/L (ref 135–146)

## 2020-03-12 ENCOUNTER — Encounter: Payer: Self-pay | Admitting: *Deleted

## 2020-03-23 ENCOUNTER — Ambulatory Visit (INDEPENDENT_AMBULATORY_CARE_PROVIDER_SITE_OTHER): Payer: PPO | Admitting: Family Medicine

## 2020-03-23 ENCOUNTER — Other Ambulatory Visit: Payer: Self-pay

## 2020-03-23 VITALS — BP 110/50 | HR 90 | Temp 98.9°F | Ht 73.0 in | Wt 263.0 lb

## 2020-03-23 DIAGNOSIS — I5021 Acute systolic (congestive) heart failure: Secondary | ICD-10-CM | POA: Diagnosis not present

## 2020-03-23 MED ORDER — POTASSIUM CHLORIDE CRYS ER 20 MEQ PO TBCR: 20.0000 meq | EXTENDED_RELEASE_TABLET | Freq: Every day | ORAL | 6 refills | Status: DC

## 2020-03-23 NOTE — Progress Notes (Signed)
Subjective:    Patient ID: Bernard Ramirez, male    DOB: Nov 14, 1930, 86 y.o.   MRN: [id number]  10/20 Patient is a very pleasant 86 year old male here today to establish care.  Patient was previously under the care of Dr. Sinda Du who has retired.  He is transferring his care to this office.  Patient's past medical history is significant for persistent atrial fibrillation.  However he is on no oral anticoagulant due to a history of a GI bleed.  This is well documented in his chart.  He is on Plavix however given history of a stroke in 2017.  MRI at that time revealed an acute left occipital infarct.  Patient also had a prostate biopsy performed in 2017 which revealed prostate adenocarcinoma with a Gleason score of 9.  Bone scan at that time revealed a possible increased uptake in the left sixth rib as well as in the right sternum.  However patient declined treatment and has not seen urology since that time.  Patient is also on hydrocodone/acetaminophen 10/325.  He takes 5 tablets a day.  He has been getting 150 tablets every month under the care of his primary care physician.  I reviewed the drug database and it is consistent that he has been receiving 150 tablets monthly from Dr. Sinda Du.  He is not receiving any other pain medication from any other doctor.  The patient is unable to tolerate NSAIDs for his chronic arthritic pain in his knees and in his lower back due to his history of GI bleeding.    02/13/20 Wt Readings from Last 3 Encounters:  03/23/20 263 lb (119.3 kg)  03/09/20 258 lb (117 kg)  02/24/20 256 lb (116.1 kg)   Patient is currently taking 2 Lasix pills in the morning and 1 Lasix pill in the evening.  Both legs have swollen substantially distal to the knee.  He has +2 pitting edema in his left leg and +1 to +2 pitting edema in the right leg.  The skin of the left leg is erythematous warm and painful from his ankle to his knee circumferentially around the leg.  Patient  reports pain in both legs due to the swelling and distention.  AT that time, my plan was: Increase Lasix to 2 tablets twice daily through the weekend and then recheck here on Monday.  The patient was placed in Unna boots bilaterally and I will start the patient on Keflex 500 mg 3 times daily for 7 days for secondary cellulitis in the left leg.  Recheck with my partner on Monday.  02/24/20 Saw my partner last week.  Weight essentially remained the same at 260 pounds despite wearing Unna boots and increasing his Lasix dose.  Patient is still taking 80 mg twice a day.  He denies any shortness of breath.  I remove the Ace wraps today from his legs.  He still has +2 pitting edema.  However the erythema has faded from both legs.  Therefore I believe the cellulitis has resolved.  He is not wearing any compression hose yet.  His wife has ordered them.  At that time, my plan was: Check a BMP and a BNP today to monitor potassium and renal status.  On his last lab work, his sugar was elevated and therefore I will check an A1c as well.  Continue Lasix 80 mg twice daily.  However we will add Zaroxolyn 2.5 mg 30 minutes prior to his Lasix.  We will do this once  a week to try to affect better diuresis.  Strongly encouraged that he elevate his legs and also wear compression hose.  Cellulitis has resolved.  03/09/20 A1c was acceptable at 6.7 at last visit.  Weight virtually the same from the last visit.  Continues to have significant edema in both legs distal to the knee.  He has +2 pitting edema in the dorsum of his left foot.  He has +2 pitting edema in his left leg.  He has +1 edema in the right foot and +2 edema in the right leg.  There is some mild erythema in both shins consistent with inflammation due to the swelling.  He denies any chest pain shortness of breath or dyspnea on exertion.  He is unable to wear compression hose.  He is unable to put them on.  We cannot try the Zaroxolyn due to the fact his renal function  deteriorated at his last visit and suggested prerenal azotemia.  At that time, my plan was: Patient continues to have significant pitting edema in both legs however he is unable to compression hose.  He is already taking 80 mg of Lasix twice a day.  Higher doses have shown increasing in his creatinine and therefore we have avoided increased dose of diuretics.  At this point I believe we have accomplished the best we can.  I will treat the patient symptomatically.  If he develops increasing shortness of breath or evidence of fluid overload I will temporarily increase his Lasix however I believe 80 mg of Lasix twice daily is the highest dose he can tolerate without dehydration on a chronic basis  03/23/20  Wt Readings from Last 3 Encounters:  03/23/20 263 lb (119.3 kg)  03/09/20 258 lb (117 kg)  02/24/20 256 lb (116.1 kg)   Patient has gained 5 pounds since his last visit.  He has +2 to +3 edema in his right leg and +2 edema in his left leg all the way to his knees.  He is also reporting increasing shortness of breath.  He is satting 92% on room air.  He has faint bibasilar crackles in both lungs.  He denies any chest pain.  He denies any pleurisy.  He denies any fevers or chills.  However clearly today the patient appears fluid overloaded.  He is taking 80 mg of Lasix twice a day without maintaining euvolemia    Past Medical History:  Diagnosis Date  . Anemia    04/2012: H&H-10.2/31.5, MCV-77; 06/2012:12/38.9  . Arthritis   . Benign prostatic hypertrophy    s/p transurethral resection of the prostate  . Chest pain    2004; with dyspnea, stress nuclear-normal EF + questionable inferior wall ischemia, normal coronary angiography;  2010-negative stress nuclear  . CVA (cerebral infarction)    asymptomatic (seen on CT)  . Diarrhea    h/o Hemoccult-positive stool  . Dysrhythmia   . Edema of both legs   . GERD (gastroesophageal reflux disease)   . History of blood transfusion   . History of  kidney stones   . History of urinary tract infection   . Hypertension   . Jerking movements of extremities    left arm   . Low back pain   . Obstructive sleep apnea    pt states can not use the CPAP  . Peripheral neuropathy    lower legs and feet bilat   . Persistent atrial fibrillation ([place]) 2013   Asymptomatic; diagnosed in 05/2012  . Pneumonia   .  Prostate cancer ([place]) 2017   stage 4  . Shortness of breath dyspnea    pt states only develops SOB if tries to do something too quickly  . Upper GI bleed 1985   1985-peptic ulcer disease; initial hemigastrectomy; subsequent Billroth II  . Urinary hesitancy   . Vertigo    ED evaluation-06/2012   Past Surgical History:  Procedure Laterality Date  . Billroth II    . CHOLECYSTECTOMY    . COLONOSCOPY  Approximately 2000  . COLONOSCOPY  10/2012   Colonoscopy March 2014 at [facility], difficulty exam requiring fluoroscopy and overtube. Patient developed severe bradycardia again during his procedure like he did [facility]. Multiple polyps removed which were tubular adenomas. Previously tattooed sites at 20 and 30 cm were free of any polypoid change. Left-sided diverticulosis noted.  . COLONOSCOPY WITH ESOPHAGOGASTRODUODENOSCOPY (EGD)  06-17-2009   NUU:[id number] tubular adenomas and 2 tubulovillous adenomas, incomplete, 3 clips placed  . ESOPHAGOGASTRODUODENOSCOPY  06/17/2009   SLF: normal/chronic gastritis (non-h pylori)  . ESOPHAGOGASTRODUODENOSCOPY N/A 05/11/2015   IHK:[id number] anemai due to postgastrectomy state/moderate erosive gastritis  . INGUINAL HERNIA REPAIR     Left  . ROTATOR CUFF REPAIR  1991   Bilateral  . TRANSURETHRAL RESECTION OF PROSTATE  06/2016  . TRANSURETHRAL RESECTION OF PROSTATE N/A 12/27/2012   Procedure: TRANSURETHRAL RESECTION OF THE PROSTATE (TURP);  Surgeon: Marissa Nestle, MD;  Location: [facility];  Service: Urology;  Laterality: N/A;  . TRANSURETHRAL RESECTION OF PROSTATE N/A 04/11/2016   Procedure:  TRANSURETHRAL RESECTION OF THE PROSTATE (TURP);  Surgeon: Irine Seal, MD;  Location: [facility];  Service: Urology;  Laterality: N/A;  . VAGOTOMY AND PYLOROPLASTY  1970s   Details of procedure uncertain; 3875I   Current Outpatient Medications on File Prior to Visit  Medication Sig Dispense Refill  . clobetasol cream (TEMOVATE) 4.33 % Apply 1 application topically daily as needed (use as directed for skin).     Marland Kitchen clopidogrel (PLAVIX) 75 MG tablet Take 1 tablet (75 mg total) by mouth daily. 90 tablet 3  . furosemide (LASIX) 40 MG tablet TAKE 1.5 TABLET BY MOUTH EVERY MORNING THEN 1 TABLET BY MOUTH EVERY AFTERNOON (Patient taking differently: TAKE 2 TABLET BY MOUTH EVERY MORNING THEN 1 TABLET BY MOUTH EVERY AFTERNOON) 225 tablet 1  . HYDROcodone-acetaminophen (NORCO) 10-325 MG tablet TAKE 1 TABLET  FIVE TIMES DAILY AS NEEDED FOR PAIN 150 tablet 0  . hydrOXYzine (ATARAX/VISTARIL) 50 MG tablet Take 1 tablet (50 mg total) by mouth every 6 (six) hours as needed for itching. 30 tablet 0  . losartan (COZAAR) 100 MG tablet Take 1 tablet (100 mg total) by mouth daily. 90 tablet 2  . meclizine (ANTIVERT) 25 MG tablet Take 25 mg by mouth at bedtime.     Marland Kitchen nystatin (MYCOSTATIN/NYSTOP) 100000 UNIT/GM POWD Apply 1 g topically daily as needed (rash).     . pantoprazole (PROTONIX) 40 MG tablet Take 1 tablet (40 mg total) by mouth daily. 90 tablet 2  . potassium chloride SA (K-DUR,KLOR-CON) 20 MEQ tablet Take 1 tablet (20 mEq total) by mouth daily. 45 tablet 6  . pravastatin (PRAVACHOL) 20 MG tablet TAKE 1 TABLET(20 MG) BY MOUTH DAILY 90 tablet 2  . triamcinolone cream (KENALOG) 0.1 % Apply 1 application topically daily as needed (for irritation).      No current facility-administered medications on file prior to visit.   Allergies  Allergen Reactions  . Contrast Media [Iodinated Diagnostic Agents]     Pt states  he was given "dye" 20 yrs ago, anaphylaxis & syncope - was told to never have it again   . Gabapentin  Itching  . Aspirin Other (See Comments)    Blistering   . Latex Rash  . Lipitor [Atorvastatin] Itching  . Tape Rash    EKG leads   Social History   Socioeconomic History  . Marital status: Married    Spouse name: Not on file  . Number of children: 5  . Years of education: Not on file  . Highest education level: Not on file  Occupational History  . Occupation: Retired Pensions consultant: RETIRED  Tobacco Use  . Smoking status: Former Smoker    Years: 12.00    Types: Cigars    Quit date: 08/22/2003    Years since quitting: 16.5  . Smokeless tobacco: Never Used  . Tobacco comment: Quit many years ago; few cigars per day; didn't inhale  Vaping Use  . Vaping Use: Never used  Substance and Sexual Activity  . Alcohol use: No    Alcohol/week: 1.0 standard drink    Types: 1 Standard drinks or equivalent per week  . Drug use: No  . Sexual activity: Not on file  Other Topics Concern  . Not on file  Social History Narrative   Lives w/ wife in [place]   Retired truck Geophysicist/field seismologist   Social Determinants of Health   Financial Resource Strain: [place]   . Difficulty of Paying Living Expenses: Not hard at all  Food Insecurity:   . Worried About Charity fundraiser in the Last Year:   . Arboriculturist in the Last Year:   Transportation Needs:   . Film/video editor (Medical):   Marland Kitchen Lack of Transportation (Non-Medical):   Physical Activity:   . Days of Exercise per Week:   . Minutes of Exercise per Session:   Stress:   . Feeling of Stress :   Social Connections:   . Frequency of Communication with Friends and Family:   . Frequency of Social Gatherings with Friends and Family:   . Attends Religious Services:   . Active Member of Clubs or Organizations:   . Attends Archivist Meetings:   Marland Kitchen Marital Status:   Intimate Partner Violence:   . Fear of Current or Ex-Partner:   . Emotionally Abused:   Marland Kitchen Physically Abused:   . Sexually Abused:        Review of Systems  All other systems reviewed and are negative.      Objective:   Physical Exam Vitals reviewed.  Constitutional:      General: He is not in acute distress.    Appearance: Normal appearance. He is obese. He is not ill-appearing or toxic-appearing.  Cardiovascular:     Rate and Rhythm: Normal rate. Rhythm irregular.     Heart sounds: Normal heart sounds. No murmur heard.  No friction rub. No gallop.   Pulmonary:     Effort: Pulmonary effort is normal. No respiratory distress.     Breath sounds: Normal breath sounds. No stridor. No wheezing, rhonchi or rales.  Abdominal:     General: Abdomen is flat. Bowel sounds are normal. There is no distension.     Palpations: Abdomen is soft. There is no mass.     Tenderness: There is no abdominal tenderness. There is no guarding.  Musculoskeletal:     Right lower leg: 2+ Edema present.     Left lower  leg: 2+ Edema present.     Right foot: Decreased range of motion. Swelling and deformity present.  Skin:    Findings: No erythema.  Neurological:     General: No focal deficit present.     Mental Status: He is alert and oriented to person, place, and time.     Cranial Nerves: No cranial nerve deficit.     Sensory: No sensory deficit.     Motor: No weakness.     Coordination: Coordination normal.     Gait: Gait abnormal (Shuffling gait.  Requires a cane to ambulate).     Deep Tendon Reflexes: Reflexes normal.  Psychiatric:        Mood and Affect: Mood normal.        Thought Content: Thought content normal.        Judgment: Judgment normal.           Assessment & Plan:  Acute systolic congestive heart failure ([place]) - Plan: Brain natriuretic peptide, BASIC METABOLIC PANEL WITH GFR  Patient is fluid overloaded.  Increase Lasix to 120 mg twice daily.  He has not been taking any potassium so we will add K-Dur 20 mill equivalents daily.  Check a BMP today to monitor baseline creatinine prior to increase diuresis  as well as potassium.  Recheck the patient on Thursday.  May need to increase the dose of Lasix additionally if not achieving adequate diuresis or potentially add spironolactone vs Zaroxolyn

## 2020-03-24 LAB — BASIC METABOLIC PANEL WITH GFR
BUN/Creatinine Ratio: 12 (calc) (ref 6–22)
BUN: 15 mg/dL (ref 7–25)
CO2: 30 mmol/L (ref 20–32)
Calcium: 8.9 mg/dL (ref 8.6–10.3)
Chloride: 101 mmol/L (ref 98–110)
Creat: 1.23 mg/dL — ABNORMAL HIGH (ref 0.70–1.11)
GFR, Est African American: 61 mL/min/{1.73_m2} (ref >=60)
GFR, Est Non African American: 52 mL/min/{1.73_m2} — ABNORMAL LOW (ref >=60)
Glucose, Bld: 135 mg/dL — ABNORMAL HIGH (ref 65–99)
Potassium: 3.9 mmol/L (ref 3.5–5.3)
Sodium: 140 mmol/L (ref 135–146)

## 2020-03-24 LAB — BRAIN NATRIURETIC PEPTIDE: Brain Natriuretic Peptide: 55 pg/mL (ref <100)

## 2020-03-25 ENCOUNTER — Ambulatory Visit (INDEPENDENT_AMBULATORY_CARE_PROVIDER_SITE_OTHER): Payer: PPO | Admitting: Family Medicine

## 2020-03-25 ENCOUNTER — Other Ambulatory Visit: Payer: Self-pay

## 2020-03-25 VITALS — BP 114/60 | HR 77 | Temp 98.0°F | Ht 73.0 in | Wt 264.0 lb

## 2020-03-25 DIAGNOSIS — I5020 Unspecified systolic (congestive) heart failure: Secondary | ICD-10-CM

## 2020-03-25 DIAGNOSIS — M7989 Other specified soft tissue disorders: Secondary | ICD-10-CM | POA: Diagnosis not present

## 2020-03-25 DIAGNOSIS — R2243 Localized swelling, mass and lump, lower limb, bilateral: Secondary | ICD-10-CM

## 2020-03-25 NOTE — Progress Notes (Signed)
Subjective:    Patient ID: Bernard Ramirez, male    DOB: [birth year], 86 y.o.   MRN: [id number]  10/20 Patient is a very pleasant 86 year old male here today to establish care.  Patient was previously under the care of Dr. Sinda Du who has retired.  He is transferring his care to this office.  Patient's past medical history is significant for persistent atrial fibrillation.  However he is on no oral anticoagulant due to a history of a GI bleed.  This is well documented in his chart.  He is on Plavix however given history of a stroke in 2017.  MRI at that time revealed an acute left occipital infarct.  Patient also had a prostate biopsy performed in 2017 which revealed prostate adenocarcinoma with a Gleason score of 9.  Bone scan at that time revealed a possible increased uptake in the left sixth rib as well as in the right sternum.  However patient declined treatment and has not seen urology since that time.  Patient is also on hydrocodone/acetaminophen 10/325.  He takes 5 tablets a day.  He has been getting 150 tablets every month under the care of his primary care physician.  I reviewed the drug database and it is consistent that he has been receiving 150 tablets monthly from Dr. Sinda Du.  He is not receiving any other pain medication from any other doctor.  The patient is unable to tolerate NSAIDs for his chronic arthritic pain in his knees and in his lower back due to his history of GI bleeding.    02/13/20 Wt Readings from Last 3 Encounters:  03/25/20 264 lb (119.7 kg)  03/23/20 263 lb (119.3 kg)  03/09/20 258 lb (117 kg)   Patient is currently taking 2 Lasix pills in the morning and 1 Lasix pill in the evening.  Both legs have swollen substantially distal to the knee.  He has +2 pitting edema in his left leg and +1 to +2 pitting edema in the right leg.  The skin of the left leg is erythematous warm and painful from his ankle to his knee circumferentially around the leg.  Patient  reports pain in both legs due to the swelling and distention.  AT that time, my plan was: Increase Lasix to 2 tablets twice daily through the weekend and then recheck here on Monday.  The patient was placed in Unna boots bilaterally and I will start the patient on Keflex 500 mg 3 times daily for 7 days for secondary cellulitis in the left leg.  Recheck with my partner on Monday.  02/24/20 Saw my partner last week.  Weight essentially remained the same at 260 pounds despite wearing Unna boots and increasing his Lasix dose.  Patient is still taking 80 mg twice a day.  He denies any shortness of breath.  I remove the Ace wraps today from his legs.  He still has +2 pitting edema.  However the erythema has faded from both legs.  Therefore I believe the cellulitis has resolved.  He is not wearing any compression hose yet.  His wife has ordered them.  At that time, my plan was: Check a BMP and a BNP today to monitor potassium and renal status.  On his last lab work, his sugar was elevated and therefore I will check an A1c as well.  Continue Lasix 80 mg twice daily.  However we will add Zaroxolyn 2.5 mg 30 minutes prior to his Lasix.  We will do this once  a week to try to affect better diuresis.  Strongly encouraged that he elevate his legs and also wear compression hose.  Cellulitis has resolved.  03/09/20 A1c was acceptable at 6.7 at last visit.  Weight virtually the same from the last visit.  Continues to have significant edema in both legs distal to the knee.  He has +2 pitting edema in the dorsum of his left foot.  He has +2 pitting edema in his left leg.  He has +1 edema in the right foot and +2 edema in the right leg.  There is some mild erythema in both shins consistent with inflammation due to the swelling.  He denies any chest pain shortness of breath or dyspnea on exertion.  He is unable to wear compression hose.  He is unable to put them on.  We cannot try the Zaroxolyn due to the fact his renal function  deteriorated at his last visit and suggested prerenal azotemia.  At that time, my plan was: Patient continues to have significant pitting edema in both legs however he is unable to compression hose.  He is already taking 80 mg of Lasix twice a day.  Higher doses have shown increasing in his creatinine and therefore we have avoided increased dose of diuretics.  At this point I believe we have accomplished the best we can.  I will treat the patient symptomatically.  If he develops increasing shortness of breath or evidence of fluid overload I will temporarily increase his Lasix however I believe 80 mg of Lasix twice daily is the highest dose he can tolerate without dehydration on a chronic basis  03/23/20  Wt Readings from Last 3 Encounters:  03/25/20 264 lb (119.7 kg)  03/23/20 263 lb (119.3 kg)  03/09/20 258 lb (117 kg)   Patient has gained 5 pounds since his last visit.  He has +2 to +3 edema in his right leg and +2 edema in his left leg all the way to his knees.  He is also reporting increasing shortness of breath.  He is satting 92% on room air.  He has faint bibasilar crackles in both lungs.  He denies any chest pain.  He denies any pleurisy.  He denies any fevers or chills.  However clearly today the patient appears fluid overloaded.  He is taking 80 mg of Lasix twice a day without maintaining euvolemia.  AT that time, my plan was: Patient is fluid overloaded.  Increase Lasix to 120 mg twice daily.  He has not been taking any potassium so we will add K-Dur 20 mill equivalents daily.  Check a BMP today to monitor baseline creatinine prior to increase diuresis as well as potassium.  Recheck the patient on Thursday.  May need to increase the dose of Lasix additionally if not achieving adequate diuresis or potentially add spironolactone vs Zaroxolyn  03/25/20 Despite increasing Lasix to 120 mg twice a day, the patient has gained weight.  He is up 1 pound.  His BMP was stable.  His BNP was normal.   Therefore I believe the patient is most likely third spacing due to venous insufficiency.  I suspect that intravascular volume is likely depleted and therefore Lasix is not working as effectively as I hope.  Therefore I had a long discussion today with the patient and his wife about the need to use compression therapy to help force the pitting edema back into the blood vessels where the Lasix can be more effective in removing the fluid from  the body.  Patient will agree to allow me to apply Unna boots.    Past Medical History:  Diagnosis Date  . Anemia    04/2012: H&H-10.2/31.5, MCV-77; 06/2012:12/38.9  . Arthritis   . Benign prostatic hypertrophy    s/p transurethral resection of the prostate  . Chest pain    2004; with dyspnea, stress nuclear-normal EF + questionable inferior wall ischemia, normal coronary angiography;  2010-negative stress nuclear  . CVA (cerebral infarction)    asymptomatic (seen on CT)  . Diarrhea    h/o Hemoccult-positive stool  . Dysrhythmia   . Edema of both legs   . GERD (gastroesophageal reflux disease)   . History of blood transfusion   . History of kidney stones   . History of urinary tract infection   . Hypertension   . Jerking movements of extremities    left arm   . Low back pain   . Obstructive sleep apnea    pt states can not use the CPAP  . Peripheral neuropathy    lower legs and feet bilat   . Persistent atrial fibrillation ([place]) 2013   Asymptomatic; diagnosed in 05/2012  . Pneumonia   . Prostate cancer ([place]) 2017   stage 4  . Shortness of breath dyspnea    pt states only develops SOB if tries to do something too quickly  . Upper GI bleed 1985   1985-peptic ulcer disease; initial hemigastrectomy; subsequent Billroth II  . Urinary hesitancy   . Vertigo    ED evaluation-06/2012   Past Surgical History:  Procedure Laterality Date  . Billroth II    . CHOLECYSTECTOMY    . COLONOSCOPY  Approximately 2000  . COLONOSCOPY  10/2012    Colonoscopy March 2014 at [facility], difficulty exam requiring fluoroscopy and overtube. Patient developed severe bradycardia again during his procedure like he did [facility]. Multiple polyps removed which were tubular adenomas. Previously tattooed sites at 20 and 30 cm were free of any polypoid change. Left-sided diverticulosis noted.  . COLONOSCOPY WITH ESOPHAGOGASTRODUODENOSCOPY (EGD)  06-17-2009   YPP:[id number] tubular adenomas and 2 tubulovillous adenomas, incomplete, 3 clips placed  . ESOPHAGOGASTRODUODENOSCOPY  06/17/2009   SLF: normal/chronic gastritis (non-h pylori)  . ESOPHAGOGASTRODUODENOSCOPY N/A 05/11/2015   IWP:[id number] anemai due to postgastrectomy state/moderate erosive gastritis  . INGUINAL HERNIA REPAIR     Left  . ROTATOR CUFF REPAIR  1991   Bilateral  . TRANSURETHRAL RESECTION OF PROSTATE  06/2016  . TRANSURETHRAL RESECTION OF PROSTATE N/A 12/27/2012   Procedure: TRANSURETHRAL RESECTION OF THE PROSTATE (TURP);  Surgeon: Marissa Nestle, MD;  Location: [facility];  Service: Urology;  Laterality: N/A;  . TRANSURETHRAL RESECTION OF PROSTATE N/A 04/11/2016   Procedure: TRANSURETHRAL RESECTION OF THE PROSTATE (TURP);  Surgeon: Irine Seal, MD;  Location: [facility];  Service: Urology;  Laterality: N/A;  . VAGOTOMY AND PYLOROPLASTY  1970s   Details of procedure uncertain; 8250N   Current Outpatient Medications on File Prior to Visit  Medication Sig Dispense Refill  . clobetasol cream (TEMOVATE) 3.97 % Apply 1 application topically daily as needed (use as directed for skin).     Marland Kitchen clopidogrel (PLAVIX) 75 MG tablet Take 1 tablet (75 mg total) by mouth daily. 90 tablet 3  . furosemide (LASIX) 40 MG tablet TAKE 1.5 TABLET BY MOUTH EVERY MORNING THEN 1 TABLET BY MOUTH EVERY AFTERNOON (Patient taking differently: TAKE 2 TABLET BY MOUTH EVERY MORNING THEN 1 TABLET BY MOUTH EVERY AFTERNOON) 225 tablet 1  .  HYDROcodone-acetaminophen (NORCO) 10-325 MG tablet TAKE 1 TABLET  FIVE TIMES DAILY AS  NEEDED FOR PAIN 150 tablet 0  . hydrOXYzine (ATARAX/VISTARIL) 50 MG tablet Take 1 tablet (50 mg total) by mouth every 6 (six) hours as needed for itching. 30 tablet 0  . losartan (COZAAR) 100 MG tablet Take 1 tablet (100 mg total) by mouth daily. 90 tablet 2  . meclizine (ANTIVERT) 25 MG tablet Take 25 mg by mouth at bedtime.     Marland Kitchen nystatin (MYCOSTATIN/NYSTOP) 100000 UNIT/GM POWD Apply 1 g topically daily as needed (rash).     . pantoprazole (PROTONIX) 40 MG tablet Take 1 tablet (40 mg total) by mouth daily. 90 tablet 2  . potassium chloride SA (KLOR-CON) 20 MEQ tablet Take 1 tablet (20 mEq total) by mouth daily. 30 tablet 6  . pravastatin (PRAVACHOL) 20 MG tablet TAKE 1 TABLET(20 MG) BY MOUTH DAILY 90 tablet 2  . triamcinolone cream (KENALOG) 0.1 % Apply 1 application topically daily as needed (for irritation).      No current facility-administered medications on file prior to visit.   Allergies  Allergen Reactions  . Contrast Media [Iodinated Diagnostic Agents]     Pt states he was given "dye" 20 yrs ago, anaphylaxis & syncope - was told to never have it again   . Gabapentin Itching  . Aspirin Other (See Comments)    Blistering   . Latex Rash  . Lipitor [Atorvastatin] Itching  . Tape Rash    EKG leads   Social History   Socioeconomic History  . Marital status: Married    Spouse name: Not on file  . Number of children: 5  . Years of education: Not on file  . Highest education level: Not on file  Occupational History  . Occupation: Retired Pensions consultant: RETIRED  Tobacco Use  . Smoking status: Former Smoker    Years: 12.00    Types: Cigars    Quit date: 08/22/2003    Years since quitting: 16.6  . Smokeless tobacco: Never Used  . Tobacco comment: Quit many years ago; few cigars per day; didn't inhale  Vaping Use  . Vaping Use: Never used  Substance and Sexual Activity  . Alcohol use: No    Alcohol/week: 1.0 standard drink    Types: 1 Standard drinks or  equivalent per week  . Drug use: No  . Sexual activity: Not on file  Other Topics Concern  . Not on file  Social History Narrative   Lives w/ wife in [place]   Retired truck Geophysicist/field seismologist   Social Determinants of Health   Financial Resource Strain: [place]   . Difficulty of Paying Living Expenses: Not hard at all  Food Insecurity:   . Worried About Charity fundraiser in the Last Year:   . Arboriculturist in the Last Year:   Transportation Needs:   . Film/video editor (Medical):   Marland Kitchen Lack of Transportation (Non-Medical):   Physical Activity:   . Days of Exercise per Week:   . Minutes of Exercise per Session:   Stress:   . Feeling of Stress :   Social Connections:   . Frequency of Communication with Friends and Family:   . Frequency of Social Gatherings with Friends and Family:   . Attends Religious Services:   . Active Member of Clubs or Organizations:   . Attends Archivist Meetings:   Marland Kitchen Marital Status:   Intimate Partner Violence:   .  Fear of Current or Ex-Partner:   . Emotionally Abused:   Marland Kitchen Physically Abused:   . Sexually Abused:       Review of Systems  All other systems reviewed and are negative.      Objective:   Physical Exam Vitals reviewed.  Constitutional:      General: He is not in acute distress.    Appearance: Normal appearance. He is obese. He is not ill-appearing or toxic-appearing.  Cardiovascular:     Rate and Rhythm: Normal rate. Rhythm irregular.     Heart sounds: Normal heart sounds. No murmur heard.  No friction rub. No gallop.   Pulmonary:     Effort: Pulmonary effort is normal. No respiratory distress.     Breath sounds: Normal breath sounds. No stridor. No wheezing, rhonchi or rales.  Abdominal:     General: Abdomen is flat. Bowel sounds are normal. There is no distension.     Palpations: Abdomen is soft. There is no mass.     Tenderness: There is no abdominal tenderness. There is no guarding.  Musculoskeletal:      Right lower leg: 2+ Edema present.     Left lower leg: 2+ Edema present.     Right foot: Decreased range of motion. Swelling and deformity present.  Skin:    Findings: No erythema.  Neurological:     General: No focal deficit present.     Mental Status: He is alert and oriented to person, place, and time.     Cranial Nerves: No cranial nerve deficit.     Sensory: No sensory deficit.     Motor: No weakness.     Coordination: Coordination normal.     Gait: Gait abnormal (Shuffling gait.  Requires a cane to ambulate).     Deep Tendon Reflexes: Reflexes normal.  Psychiatric:        Mood and Affect: Mood normal.        Thought Content: Thought content normal.        Judgment: Judgment normal.           Assessment & Plan:  Leg swelling  Systolic congestive heart failure, unspecified HF chronicity (HCC)  Unna boots were applied to both legs today.  I have recommended the patient continue Lasix 120 mg twice a day.  I want him to take Zaroxolyn 5 mg p.o. times 1 in the morning 30 minutes before his first dose of Lasix.  He can repeat this process on Sunday if the swelling has not improved.  I want to see the patient back on Monday to remove the compression hose and recheck his edema.

## 2020-03-29 ENCOUNTER — Ambulatory Visit (INDEPENDENT_AMBULATORY_CARE_PROVIDER_SITE_OTHER): Payer: PPO | Admitting: Family Medicine

## 2020-03-29 ENCOUNTER — Other Ambulatory Visit: Payer: Self-pay

## 2020-03-29 ENCOUNTER — Encounter: Payer: Self-pay | Admitting: Family Medicine

## 2020-03-29 ENCOUNTER — Ambulatory Visit: Payer: PPO | Admitting: Family Medicine

## 2020-03-29 VITALS — BP 108/68 | HR 94 | Temp 98.7°F | Ht 73.0 in | Wt 256.0 lb

## 2020-03-29 DIAGNOSIS — I5020 Unspecified systolic (congestive) heart failure: Secondary | ICD-10-CM

## 2020-03-29 DIAGNOSIS — M7989 Other specified soft tissue disorders: Secondary | ICD-10-CM

## 2020-03-29 LAB — BASIC METABOLIC PANEL WITH GFR
BUN/Creatinine Ratio: 14 (calc) (ref 6–22)
BUN: 24 mg/dL (ref 7–25)
CO2: 33 mmol/L — ABNORMAL HIGH (ref 20–32)
Calcium: 9.2 mg/dL (ref 8.6–10.3)
Chloride: 94 mmol/L — ABNORMAL LOW (ref 98–110)
Creat: 1.72 mg/dL — ABNORMAL HIGH (ref 0.70–1.11)
GFR, Est African American: 41 mL/min/{1.73_m2} — ABNORMAL LOW (ref >=60)
GFR, Est Non African American: 35 mL/min/{1.73_m2} — ABNORMAL LOW (ref >=60)
Glucose, Bld: 165 mg/dL — ABNORMAL HIGH (ref 65–99)
Potassium: 3.8 mmol/L (ref 3.5–5.3)
Sodium: 137 mmol/L (ref 135–146)

## 2020-03-29 MED ORDER — POTASSIUM CHLORIDE CRYS ER 20 MEQ PO TBCR: 20.0000 meq | EXTENDED_RELEASE_TABLET | Freq: Every day | ORAL | 6 refills | Status: DC

## 2020-03-29 NOTE — Progress Notes (Signed)
Subjective:    Patient ID: Bernard Ramirez, male    DOB: 11-Sep-1930, 86 y.o.   MRN: [id number]  03/09/20 A1c was acceptable at 6.7 at last visit.  Weight virtually the same from the last visit.  Continues to have significant edema in both legs distal to the knee.  He has +2 pitting edema in the dorsum of his left foot.  He has +2 pitting edema in his left leg.  He has +1 edema in the right foot and +2 edema in the right leg.  There is some mild erythema in both shins consistent with inflammation due to the swelling.  He denies any chest pain shortness of breath or dyspnea on exertion.  He is unable to wear compression hose.  He is unable to put them on.  We cannot try the Zaroxolyn due to the fact his renal function deteriorated at his last visit and suggested prerenal azotemia.  At that time, my plan was: Patient continues to have significant pitting edema in both legs however he is unable to compression hose.  He is already taking 80 mg of Lasix twice a day.  Higher doses have shown increasing in his creatinine and therefore we have avoided increased dose of diuretics.  At this point I believe we have accomplished the best we can.  I will treat the patient symptomatically.  If he develops increasing shortness of breath or evidence of fluid overload I will temporarily increase his Lasix however I believe 80 mg of Lasix twice daily is the highest dose he can tolerate without dehydration on a chronic basis  03/23/20 Patient has gained 5 pounds since his last visit.  He has +2 to +3 edema in his right leg and +2 edema in his left leg all the way to his knees.  He is also reporting increasing shortness of breath.  He is satting 92% on room air.  He has faint bibasilar crackles in both lungs.  He denies any chest pain.  He denies any pleurisy.  He denies any fevers or chills.  However clearly today the patient appears fluid overloaded.  He is taking 80 mg of Lasix twice a day without maintaining euvolemia.  AT  that time, my plan was: Patient is fluid overloaded.  Increase Lasix to 120 mg twice daily.  He has not been taking any potassium so we will add K-Dur 20 mill equivalents daily.  Check a BMP today to monitor baseline creatinine prior to increase diuresis as well as potassium.  Recheck the patient on Thursday.  May need to increase the dose of Lasix additionally if not achieving adequate diuresis or potentially add spironolactone vs Zaroxolyn  03/25/20 Despite increasing Lasix to 120 mg twice a day, the patient has gained weight.  He is up 1 pound.  His BMP was stable.  His BNP was normal.  Therefore I believe the patient is most likely third spacing due to venous insufficiency.  I suspect that intravascular volume is likely depleted and therefore Lasix is not working as effectively as I hope.  Therefore I had a long discussion today with the patient and his wife about the need to use compression therapy to help force the pitting edema back into the blood vessels where the Lasix can be more effective in removing the fluid from the body.  Patient will agree to allow me to apply Unna boots.  At that time, my plan was: Unna boots were applied to both legs today.  I have  recommended the patient continue Lasix 120 mg twice a day.  I want him to take Zaroxolyn 5 mg p.o. times 1 in the morning 30 minutes before his first dose of Lasix.  He can repeat this process on Sunday if the swelling has not improved.  I want to see the patient back on Monday to remove the compression wraps and recheck his edema.  03/29/20 Patient has lost 8 pounds since his last office visit.  The pitting edema has drastically reduced in both legs where the Unna boots are in place.  The erythema has also improved.  Patient denies any shortness of breath today.  He denies any chest pain.  He does have a blister forming over the lateral aspect of his left shin.  There is no surrounding erythema.  There is no warmth.  There is no fluctuance.  He  denies any fevers or chills.    Past Medical History:  Diagnosis Date  . Anemia    04/2012: H&H-10.2/31.5, MCV-77; 06/2012:12/38.9  . Arthritis   . Benign prostatic hypertrophy    s/p transurethral resection of the prostate  . Chest pain    2004; with dyspnea, stress nuclear-normal EF + questionable inferior wall ischemia, normal coronary angiography;  2010-negative stress nuclear  . CVA (cerebral infarction)    asymptomatic (seen on CT)  . Diarrhea    h/o Hemoccult-positive stool  . Dysrhythmia   . Edema of both legs   . GERD (gastroesophageal reflux disease)   . History of blood transfusion   . History of kidney stones   . History of urinary tract infection   . Hypertension   . Jerking movements of extremities    left arm   . Low back pain   . Obstructive sleep apnea    pt states can not use the CPAP  . Peripheral neuropathy    lower legs and feet bilat   . Persistent atrial fibrillation ([place]) 2013   Asymptomatic; diagnosed in 05/2012  . Pneumonia   . Prostate cancer ([place]) 2017   stage 4  . Shortness of breath dyspnea    pt states only develops SOB if tries to do something too quickly  . Upper GI bleed 1985   1985-peptic ulcer disease; initial hemigastrectomy; subsequent Billroth II  . Urinary hesitancy   . Vertigo    ED evaluation-06/2012   Past Surgical History:  Procedure Laterality Date  . Billroth II    . CHOLECYSTECTOMY    . COLONOSCOPY  Approximately 2000  . COLONOSCOPY  10/2012   Colonoscopy March 2014 at [facility], difficulty exam requiring fluoroscopy and overtube. Patient developed severe bradycardia again during his procedure like he did [facility]. Multiple polyps removed which were tubular adenomas. Previously tattooed sites at 20 and 30 cm were free of any polypoid change. Left-sided diverticulosis noted.  . COLONOSCOPY WITH ESOPHAGOGASTRODUODENOSCOPY (EGD)  06-17-2009   [facility]:CWCBJSEG tubular adenomas and 2 tubulovillous adenomas, incomplete, 3  clips placed  . ESOPHAGOGASTRODUODENOSCOPY  06/17/2009   SLF: normal/chronic gastritis (non-h pylori)  . ESOPHAGOGASTRODUODENOSCOPY N/A 05/11/2015   BTD:VVOHYWV anemai due to postgastrectomy state/moderate erosive gastritis  . INGUINAL HERNIA REPAIR     Left  . ROTATOR CUFF REPAIR  1991   Bilateral  . TRANSURETHRAL RESECTION OF PROSTATE  06/2016  . TRANSURETHRAL RESECTION OF PROSTATE N/A 12/27/2012   Procedure: TRANSURETHRAL RESECTION OF THE PROSTATE (TURP);  Surgeon: Marissa Nestle, MD;  Location: [facility];  Service: Urology;  Laterality: N/A;  . TRANSURETHRAL RESECTION  OF PROSTATE N/A 04/11/2016   Procedure: TRANSURETHRAL RESECTION OF THE PROSTATE (TURP);  Surgeon: Irine Seal, MD;  Location: [facility];  Service: Urology;  Laterality: N/A;  . VAGOTOMY AND PYLOROPLASTY  1970s   Details of procedure uncertain; 1245Y   Current Outpatient Medications on File Prior to Visit  Medication Sig Dispense Refill  . clobetasol cream (TEMOVATE) 0.99 % Apply 1 application topically daily as needed (use as directed for skin).     Marland Kitchen clopidogrel (PLAVIX) 75 MG tablet Take 1 tablet (75 mg total) by mouth daily. 90 tablet 3  . furosemide (LASIX) 40 MG tablet TAKE 1 AND 1/2 TABLETS BY MOUTH EVERY MORNING THEN 1 TABLET BY MOUTH EVERY AFTERNOON 225 tablet 1  . furosemide (LASIX) 40 MG tablet TAKE 1.5 TABLET BY MOUTH EVERY MORNING THEN 1 TABLET BY MOUTH EVERY AFTERNOON (Patient taking differently: TAKE 2 TABLET BY MOUTH EVERY MORNING THEN 1 TABLET BY MOUTH EVERY AFTERNOON) 225 tablet 1  . HYDROcodone-acetaminophen (NORCO) 10-325 MG tablet TAKE 1 TABLET  FIVE TIMES DAILY AS NEEDED FOR PAIN 150 tablet 0  . hydrOXYzine (ATARAX/VISTARIL) 50 MG tablet Take 1 tablet (50 mg total) by mouth every 6 (six) hours as needed for itching. 30 tablet 0  . losartan (COZAAR) 100 MG tablet Take 1 tablet (100 mg total) by mouth daily. 90 tablet 2  . meclizine (ANTIVERT) 25 MG tablet Take 25 mg by mouth at bedtime.     Marland Kitchen nystatin  (MYCOSTATIN/NYSTOP) 100000 UNIT/GM POWD Apply 1 g topically daily as needed (rash).     . pantoprazole (PROTONIX) 40 MG tablet Take 1 tablet (40 mg total) by mouth daily. 90 tablet 2  . potassium chloride SA (KLOR-CON) 20 MEQ tablet Take 1 tablet (20 mEq total) by mouth daily. 30 tablet 6  . pravastatin (PRAVACHOL) 20 MG tablet TAKE 1 TABLET(20 MG) BY MOUTH DAILY 90 tablet 2  . triamcinolone cream (KENALOG) 0.1 % Apply 1 application topically daily as needed (for irritation).      No current facility-administered medications on file prior to visit.   Allergies  Allergen Reactions  . Contrast Media [Iodinated Diagnostic Agents]     Pt states he was given "dye" 20 yrs ago, anaphylaxis & syncope - was told to never have it again   . Gabapentin Itching  . Aspirin Other (See Comments)    Blistering   . Latex Rash  . Lipitor [Atorvastatin] Itching  . Tape Rash    EKG leads   Social History   Socioeconomic History  . Marital status: Married    Spouse name: Not on file  . Number of children: 5  . Years of education: Not on file  . Highest education level: Not on file  Occupational History  . Occupation: Retired Pensions consultant: RETIRED  Tobacco Use  . Smoking status: Former Smoker    Years: 12.00    Types: Cigars    Quit date: 08/22/2003    Years since quitting: 16.6  . Smokeless tobacco: Never Used  . Tobacco comment: Quit many years ago; few cigars per day; didn't inhale  Vaping Use  . Vaping Use: Never used  Substance and Sexual Activity  . Alcohol use: No    Alcohol/week: 1.0 standard drink    Types: 1 Standard drinks or equivalent per week  . Drug use: No  . Sexual activity: Not on file  Other Topics Concern  . Not on file  Social History Narrative   Lives w/  wife in [place]   Retired truck Geophysicist/field seismologist   Social Determinants of Radio broadcast assistant Strain: Concrete   . Difficulty of Paying Living Expenses: Not hard at all  Food Insecurity:   .  Worried About Charity fundraiser in the Last Year:   . Arboriculturist in the Last Year:   Transportation Needs:   . Film/video editor (Medical):   Marland Kitchen Lack of Transportation (Non-Medical):   Physical Activity:   . Days of Exercise per Week:   . Minutes of Exercise per Session:   Stress:   . Feeling of Stress :   Social Connections:   . Frequency of Communication with Friends and Family:   . Frequency of Social Gatherings with Friends and Family:   . Attends Religious Services:   . Active Member of Clubs or Organizations:   . Attends Archivist Meetings:   Marland Kitchen Marital Status:   Intimate Partner Violence:   . Fear of Current or Ex-Partner:   . Emotionally Abused:   Marland Kitchen Physically Abused:   . Sexually Abused:       Review of Systems  All other systems reviewed and are negative.      Objective:   Physical Exam Vitals reviewed.  Constitutional:      General: He is not in acute distress.    Appearance: Normal appearance. He is obese. He is not ill-appearing or toxic-appearing.  Cardiovascular:     Rate and Rhythm: Normal rate. Rhythm irregular.     Heart sounds: Normal heart sounds. No murmur heard.  No friction rub. No gallop.   Pulmonary:     Effort: Pulmonary effort is normal. No respiratory distress.     Breath sounds: Normal breath sounds. No stridor. No wheezing, rhonchi or rales.  Abdominal:     General: Abdomen is flat. Bowel sounds are normal. There is no distension.     Palpations: Abdomen is soft. There is no mass.     Tenderness: There is no abdominal tenderness. There is no guarding.  Musculoskeletal:     Right lower leg: 2+ Edema present.     Left lower leg: 2+ Edema present.     Right foot: Decreased range of motion. Swelling and deformity present.  Skin:    Findings: No erythema.  Neurological:     General: No focal deficit present.     Mental Status: He is alert and oriented to person, place, and time.     Cranial Nerves: No cranial nerve  deficit.     Sensory: No sensory deficit.     Motor: No weakness.     Coordination: Coordination normal.     Gait: Gait abnormal (Shuffling gait.  Requires a cane to ambulate).     Deep Tendon Reflexes: Reflexes normal.  Psychiatric:        Mood and Affect: Mood normal.        Thought Content: Thought content normal.        Judgment: Judgment normal.           Assessment & Plan:  Leg swelling - Plan: BASIC METABOLIC PANEL WITH GFR  Systolic congestive heart failure, unspecified HF chronicity (HCC)  Weight is down 8 pounds and legs look much better today.  I emphasized the need to use some type of compression therapy moving forward to help control the leg swelling.  Wife is unable to apply compression hose.  Patient will not wear the Unna boots at home  due to discomfort.  Therefore I recommended using Ace wraps.  I recommended that they buy a 6 inch Ace wraps with Velcro closure.  She can apply this every morning as tight as he will tolerate and then remove it every evening to allow him to sleep.  This should help afford some compression therapy that will be comfortable for the patient.  Continue Lasix 120 mg twice a day along with K. Dur 20 mEq a day.  Recheck electrolytes and potassium today along with renal function.  Tentatively will plan on using Zaroxolyn once a week on Friday to help control swelling as long as he does not have prerenal azotemia from what we did over the weekend.  Await the results of his BMP

## 2020-03-31 ENCOUNTER — Other Ambulatory Visit: Payer: Self-pay

## 2020-03-31 DIAGNOSIS — I5020 Unspecified systolic (congestive) heart failure: Secondary | ICD-10-CM

## 2020-03-31 DIAGNOSIS — I509 Heart failure, unspecified: Secondary | ICD-10-CM

## 2020-03-31 DIAGNOSIS — I10 Essential (primary) hypertension: Secondary | ICD-10-CM

## 2020-03-31 DIAGNOSIS — I5021 Acute systolic (congestive) heart failure: Secondary | ICD-10-CM

## 2020-03-31 DIAGNOSIS — M7989 Other specified soft tissue disorders: Secondary | ICD-10-CM

## 2020-04-01 ENCOUNTER — Ambulatory Visit (INDEPENDENT_AMBULATORY_CARE_PROVIDER_SITE_OTHER): Payer: PPO | Admitting: Family Medicine

## 2020-04-01 ENCOUNTER — Other Ambulatory Visit: Payer: Self-pay

## 2020-04-01 ENCOUNTER — Other Ambulatory Visit: Payer: Self-pay | Admitting: Family Medicine

## 2020-04-01 VITALS — BP 108/50 | HR 90 | Temp 98.6°F | Ht 73.0 in | Wt 256.0 lb

## 2020-04-01 DIAGNOSIS — I5020 Unspecified systolic (congestive) heart failure: Secondary | ICD-10-CM

## 2020-04-01 DIAGNOSIS — M7989 Other specified soft tissue disorders: Secondary | ICD-10-CM | POA: Diagnosis not present

## 2020-04-01 MED ORDER — HYDROCODONE-ACETAMINOPHEN 10-325 MG PO TABS: ORAL_TABLET | ORAL | 0 refills | Status: DC

## 2020-04-01 NOTE — Progress Notes (Signed)
Subjective:    Patient ID: Bernard Ramirez, male    DOB: [birth year], 86 y.o.   MRN: [id number]  03/09/20 A1c was acceptable at 6.7 at last visit.  Weight virtually the same from the last visit.  Continues to have significant edema in both legs distal to the knee.  He has +2 pitting edema in the dorsum of his left foot.  He has +2 pitting edema in his left leg.  He has +1 edema in the right foot and +2 edema in the right leg.  There is some mild erythema in both shins consistent with inflammation due to the swelling.  He denies any chest pain shortness of breath or dyspnea on exertion.  He is unable to wear compression hose.  He is unable to put them on.  We cannot try the Zaroxolyn due to the fact his renal function deteriorated at his last visit and suggested prerenal azotemia.  At that time, my plan was: Patient continues to have significant pitting edema in both legs however he is unable to compression hose.  He is already taking 80 mg of Lasix twice a day.  Higher doses have shown increasing in his creatinine and therefore we have avoided increased dose of diuretics.  At this point I believe we have accomplished the best we can.  I will treat the patient symptomatically.  If he develops increasing shortness of breath or evidence of fluid overload I will temporarily increase his Lasix however I believe 80 mg of Lasix twice daily is the highest dose he can tolerate without dehydration on a chronic basis  03/23/20 Patient has gained 5 pounds since his last visit.  He has +2 to +3 edema in his right leg and +2 edema in his left leg all the way to his knees.  He is also reporting increasing shortness of breath.  He is satting 92% on room air.  He has faint bibasilar crackles in both lungs.  He denies any chest pain.  He denies any pleurisy.  He denies any fevers or chills.  However clearly today the patient appears fluid overloaded.  He is taking 80 mg of Lasix twice a day without maintaining euvolemia.  AT  that time, my plan was: Patient is fluid overloaded.  Increase Lasix to 120 mg twice daily.  He has not been taking any potassium so we will add K-Dur 20 mill equivalents daily.  Check a BMP today to monitor baseline creatinine prior to increase diuresis as well as potassium.  Recheck the patient on Thursday.  May need to increase the dose of Lasix additionally if not achieving adequate diuresis or potentially add spironolactone vs Zaroxolyn  03/25/20 Despite increasing Lasix to 120 mg twice a day, the patient has gained weight.  He is up 1 pound.  His BMP was stable.  His BNP was normal.  Therefore I believe the patient is most likely third spacing due to venous insufficiency.  I suspect that intravascular volume is likely depleted and therefore Lasix is not working as effectively as I hope.  Therefore I had a long discussion today with the patient and his wife about the need to use compression therapy to help force the pitting edema back into the blood vessels where the Lasix can be more effective in removing the fluid from the body.  Patient will agree to allow me to apply Unna boots.  At that time, my plan was: Unna boots were applied to both legs today.  I have  recommended the patient continue Lasix 120 mg twice a day.  I want him to take Zaroxolyn 5 mg p.o. times 1 in the morning 30 minutes before his first dose of Lasix.  He can repeat this process on Sunday if the swelling has not improved.  I want to see the patient back on Monday to remove the compression wraps and recheck his edema.  03/29/20 Patient has lost 8 pounds since his last office visit.  The pitting edema has drastically reduced in both legs where the Unna boots are in place.  The erythema has also improved.  Patient denies any shortness of breath today.  He denies any chest pain.  He does have a blister forming over the lateral aspect of his left shin.  There is no surrounding erythema.  There is no warmth.  There is no fluctuance.  He  denies any fevers or chills.  At that time, my plan was: Weight is down 8 pounds and legs look much better today.  I emphasized the need to use some type of compression therapy moving forward to help control the leg swelling.  Wife is unable to apply compression hose.  Patient will not wear the Unna boots at home due to discomfort.  Therefore I recommended using Ace wraps.  I recommended that they buy a 6 inch Ace wraps with Velcro closure.  She can apply this every morning as tight as he will tolerate and then remove it every evening to allow him to sleep.  This should help afford some compression therapy that will be comfortable for the patient.  Continue Lasix 120 mg twice a day along with K. Dur 20 mEq a day.  Recheck electrolytes and potassium today along with renal function.  Tentatively will plan on using Zaroxolyn once a week on Friday to help control swelling as long as he does not have prerenal azotemia from what we did over the weekend.  Await the results of his BMP  04/01/20 Patient is here today for recheck.  Creatinine rose from 1.2-1.78 after using metolazone and Lasix 120 mg twice daily.  Therefore I recommended that the patient resume Lasix 80 mg twice daily and discontinue metolazone due to renal insufficiency.  However his swelling has been adequately controlled by the application of Ace wraps daily.  His wife has been doing this on a daily basis however she has a great deal of difficulty applying the wraps to his leg.  She is looking for another alternative.    Past Medical History:  Diagnosis Date  . Anemia    04/2012: H&H-10.2/31.5, MCV-77; 06/2012:12/38.9  . Arthritis   . Benign prostatic hypertrophy    s/p transurethral resection of the prostate  . Chest pain    2004; with dyspnea, stress nuclear-normal EF + questionable inferior wall ischemia, normal coronary angiography;  2010-negative stress nuclear  . CVA (cerebral infarction)    asymptomatic (seen on CT)  . Diarrhea     h/o Hemoccult-positive stool  . Dysrhythmia   . Edema of both legs   . GERD (gastroesophageal reflux disease)   . History of blood transfusion   . History of kidney stones   . History of urinary tract infection   . Hypertension   . Jerking movements of extremities    left arm   . Low back pain   . Obstructive sleep apnea    pt states can not use the CPAP  . Peripheral neuropathy    lower legs and feet bilat   .  Persistent atrial fibrillation ([place]) 2013   Asymptomatic; diagnosed in 05/2012  . Pneumonia   . Prostate cancer ([place]) 2017   stage 4  . Shortness of breath dyspnea    pt states only develops SOB if tries to do something too quickly  . Upper GI bleed 1985   1985-peptic ulcer disease; initial hemigastrectomy; subsequent Billroth II  . Urinary hesitancy   . Vertigo    ED evaluation-06/2012   Past Surgical History:  Procedure Laterality Date  . Billroth II    . CHOLECYSTECTOMY    . COLONOSCOPY  Approximately 2000  . COLONOSCOPY  10/2012   Colonoscopy March 2014 at [facility], difficulty exam requiring fluoroscopy and overtube. Patient developed severe bradycardia again during his procedure like he did [facility]. Multiple polyps removed which were tubular adenomas. Previously tattooed sites at 20 and 30 cm were free of any polypoid change. Left-sided diverticulosis noted.  . COLONOSCOPY WITH ESOPHAGOGASTRODUODENOSCOPY (EGD)  06-17-2009   EGB:TDVVOHYW tubular adenomas and 2 tubulovillous adenomas, incomplete, 3 clips placed  . ESOPHAGOGASTRODUODENOSCOPY  06/17/2009   SLF: normal/chronic gastritis (non-h pylori)  . ESOPHAGOGASTRODUODENOSCOPY N/A 05/11/2015   VPX:TGGYIRS anemai due to postgastrectomy state/moderate erosive gastritis  . INGUINAL HERNIA REPAIR     Left  . ROTATOR CUFF REPAIR  1991   Bilateral  . TRANSURETHRAL RESECTION OF PROSTATE  06/2016  . TRANSURETHRAL RESECTION OF PROSTATE N/A 12/27/2012   Procedure: TRANSURETHRAL RESECTION OF THE PROSTATE (TURP);   Surgeon: Marissa Nestle, MD;  Location: [facility];  Service: Urology;  Laterality: N/A;  . TRANSURETHRAL RESECTION OF PROSTATE N/A 04/11/2016   Procedure: TRANSURETHRAL RESECTION OF THE PROSTATE (TURP);  Surgeon: Irine Seal, MD;  Location: [facility];  Service: Urology;  Laterality: N/A;  . VAGOTOMY AND PYLOROPLASTY  1970s   Details of procedure uncertain; 8546E   Current Outpatient Medications on File Prior to Visit  Medication Sig Dispense Refill  . clobetasol cream (TEMOVATE) 7.03 % Apply 1 application topically daily as needed (use as directed for skin).     Marland Kitchen clopidogrel (PLAVIX) 75 MG tablet Take 1 tablet (75 mg total) by mouth daily. 90 tablet 3  . furosemide (LASIX) 40 MG tablet TAKE 1 AND 1/2 TABLETS BY MOUTH EVERY MORNING THEN 1 TABLET BY MOUTH EVERY AFTERNOON 225 tablet 1  . furosemide (LASIX) 40 MG tablet TAKE 1.5 TABLET BY MOUTH EVERY MORNING THEN 1 TABLET BY MOUTH EVERY AFTERNOON (Patient taking differently: TAKE 2 TABLET BY MOUTH EVERY MORNING THEN 1 TABLET BY MOUTH EVERY AFTERNOON) 225 tablet 1  . HYDROcodone-acetaminophen (NORCO) 10-325 MG tablet TAKE 1 TABLET  FIVE TIMES DAILY AS NEEDED FOR PAIN 150 tablet 0  . hydrOXYzine (ATARAX/VISTARIL) 50 MG tablet Take 1 tablet (50 mg total) by mouth every 6 (six) hours as needed for itching. 30 tablet 0  . losartan (COZAAR) 100 MG tablet Take 1 tablet (100 mg total) by mouth daily. 90 tablet 2  . meclizine (ANTIVERT) 25 MG tablet Take 25 mg by mouth at bedtime.     Marland Kitchen nystatin (MYCOSTATIN/NYSTOP) 100000 UNIT/GM POWD Apply 1 g topically daily as needed (rash).     . pantoprazole (PROTONIX) 40 MG tablet Take 1 tablet (40 mg total) by mouth daily. 90 tablet 2  . potassium chloride SA (KLOR-CON) 20 MEQ tablet Take 1 tablet (20 mEq total) by mouth daily. 30 tablet 6  . pravastatin (PRAVACHOL) 20 MG tablet TAKE 1 TABLET(20 MG) BY MOUTH DAILY 90 tablet 2  . triamcinolone cream (KENALOG) 0.1 %  Apply 1 application topically daily as needed (for  irritation).      No current facility-administered medications on file prior to visit.   Allergies  Allergen Reactions  . Contrast Media [Iodinated Diagnostic Agents]     Pt states he was given "dye" 20 yrs ago, anaphylaxis & syncope - was told to never have it again   . Gabapentin Itching  . Aspirin Other (See Comments)    Blistering   . Latex Rash  . Lipitor [Atorvastatin] Itching  . Tape Rash    EKG leads   Social History   Socioeconomic History  . Marital status: Married    Spouse name: Not on file  . Number of children: 5  . Years of education: Not on file  . Highest education level: Not on file  Occupational History  . Occupation: Retired Pensions consultant: RETIRED  Tobacco Use  . Smoking status: Former Smoker    Years: 12.00    Types: Cigars    Quit date: 08/22/2003    Years since quitting: 16.6  . Smokeless tobacco: Never Used  . Tobacco comment: Quit many years ago; few cigars per day; didn't inhale  Vaping Use  . Vaping Use: Never used  Substance and Sexual Activity  . Alcohol use: No    Alcohol/week: 1.0 standard drink    Types: 1 Standard drinks or equivalent per week  . Drug use: No  . Sexual activity: Not on file  Other Topics Concern  . Not on file  Social History Narrative   Lives w/ wife in [place]   Retired truck Geophysicist/field seismologist   Social Determinants of Health   Financial Resource Strain: [place]   . Difficulty of Paying Living Expenses: Not hard at all  Food Insecurity:   . Worried About Charity fundraiser in the Last Year:   . Arboriculturist in the Last Year:   Transportation Needs:   . Film/video editor (Medical):   Marland Kitchen Lack of Transportation (Non-Medical):   Physical Activity:   . Days of Exercise per Week:   . Minutes of Exercise per Session:   Stress:   . Feeling of Stress :   Social Connections:   . Frequency of Communication with Friends and Family:   . Frequency of Social Gatherings with Friends and Family:   .  Attends Religious Services:   . Active Member of Clubs or Organizations:   . Attends Archivist Meetings:   Marland Kitchen Marital Status:   Intimate Partner Violence:   . Fear of Current or Ex-Partner:   . Emotionally Abused:   Marland Kitchen Physically Abused:   . Sexually Abused:       Review of Systems  All other systems reviewed and are negative.      Objective:   Physical Exam Vitals reviewed.  Constitutional:      General: He is not in acute distress.    Appearance: Normal appearance. He is obese. He is not ill-appearing or toxic-appearing.  Cardiovascular:     Rate and Rhythm: Normal rate. Rhythm irregular.     Heart sounds: Normal heart sounds. No murmur heard.  No friction rub. No gallop.   Pulmonary:     Effort: Pulmonary effort is normal. No respiratory distress.     Breath sounds: Normal breath sounds. No stridor. No wheezing, rhonchi or rales.  Abdominal:     General: Abdomen is flat. Bowel sounds are normal. There is no distension.  Palpations: Abdomen is soft. There is no mass.     Tenderness: There is no abdominal tenderness. There is no guarding.  Musculoskeletal:     Right lower leg: 2+ Edema present.     Left lower leg: 2+ Edema present.     Right foot: Decreased range of motion. Swelling and deformity present.  Skin:    Findings: No erythema.  Neurological:     General: No focal deficit present.     Mental Status: He is alert and oriented to person, place, and time.     Cranial Nerves: No cranial nerve deficit.     Sensory: No sensory deficit.     Motor: No weakness.     Coordination: Coordination normal.     Gait: Gait abnormal (Shuffling gait.  Requires a cane to ambulate).     Deep Tendon Reflexes: Reflexes normal.  Psychiatric:        Mood and Affect: Mood normal.        Thought Content: Thought content normal.        Judgment: Judgment normal.           Assessment & Plan:  Leg swelling  Systolic congestive heart failure, unspecified HF  chronicity ([place])  Recommended they resume Lasix 80 mg twice daily and discontinue metolazone.  Recommended that they get zip up compression hose.  Showed the patient and his wife several photos available on [employer].  She believes that she could apply these.  I believe that this would help manage the swelling and avoid aggressive diuresis that could cause prerenal azotemia.

## 2020-04-02 ENCOUNTER — Telehealth: Payer: Self-pay | Admitting: Family Medicine

## 2020-04-02 NOTE — Telephone Encounter (Signed)
Bernard Ramirez stopped by requesting Dr. Dennard Schaumann to call her. CB# [phone]  She states that she took patient to [place] to have patients legs measured for compression hose. She states that they won't measure him unless he is going to purchase from them.  CB# [phone]

## 2020-04-08 NOTE — Telephone Encounter (Signed)
Please advise if there is any other measuring site you are aware of.

## 2020-04-09 NOTE — Telephone Encounter (Signed)
I am not aware of any other measuring site.  If [facility] will measure the legs I do not see why she went by the compression hose from them?  Furthermore I thought she was going to get the zip up compression hose off [place].  Would recommend xxl if she buys them over [employer]

## 2020-04-09 NOTE — Telephone Encounter (Signed)
Call placed to patient. LMTRC.  

## 2020-04-15 NOTE — Telephone Encounter (Signed)
Multiple calls placed to patient with no answer and no return call.   Message to be closed.  

## 2020-05-03 NOTE — Progress Notes (Signed)
CARDIOLOGY OFFICE NOTE  Date:  05/10/2020    [name] Date of Birth: 1931-04-27 Medical Record #[id number]  PCP:  [name], MD  Cardiologist:  [name] Allred    Chief Complaint  Patient presents with  . Follow-up    Seen for Dr. [name]    History of Present Illness: Bernard Ramirez is a 86 y.o. male who presents today for a 6 month check. Seen for Dr. [name].   He has a history ofpersistent AFib, not onanticoagulation due to prior GI bleed/anemia. Other issues include priorCVA, OSA intoleranttoCPAP, prostate cancer, HTN, chronicdiastolic HF and mild AS. He has had issues with chronic back pain and is quite sedentary. He has had intermittent issues with swelling/DOE and required additional diuretics. Noted he likes salt.   Last seen in October 2020 by Dr. [name]. Having more DOE, edema and chest pain. Weight was up 9 pounds.Dr. [name] noted that there was not much left for [place] to do for him - he has considerable dietary indiscretion, sedentary lifestyle, etc.Myoview was updated and was reassuring.I then saw him back in early December - he was actually losing weight, restricting salt but still pretty sedentary. Last visit with me in March - doing ok - still pretty sedentary from chronic back issues.   Comes in today. Here with his wife. He is in a wheelchair. She augments the history. She says he is doing the same. He eats/sleeps/sits all day. His swelling is basically the same. Weight is down 5 pounds. His breathing is stable per his report. No chest pain. Tolerating his medicines. No real issues that they note.   Past Medical History:  Diagnosis Date  . Anemia    04/2012: H&H-10.2/31.5, MCV-77; 06/2012:12/38.9  . Arthritis   . Benign prostatic hypertrophy    s/p transurethral resection of the prostate  . Chest pain    2004; with dyspnea, stress nuclear-normal EF + questionable inferior wall ischemia, normal coronary angiography;  2010-negative  stress nuclear  . CVA (cerebral infarction)    asymptomatic (seen on CT)  . Diarrhea    h/o Hemoccult-positive stool  . Dysrhythmia   . Edema of both legs   . GERD (gastroesophageal reflux disease)   . History of blood transfusion   . History of kidney stones   . History of urinary tract infection   . Hypertension   . Jerking movements of extremities    left arm   . Low back pain   . Obstructive sleep apnea    pt states can not use the CPAP  . Peripheral neuropathy    lower legs and feet bilat   . Persistent atrial fibrillation ([place]) 2013   Asymptomatic; diagnosed in 05/2012  . Pneumonia   . Prostate cancer ([place]) 2017   stage 4  . Shortness of breath dyspnea    pt states only develops SOB if tries to do something too quickly  . Upper GI bleed 1985   1985-peptic ulcer disease; initial hemigastrectomy; subsequent Billroth II  . Urinary hesitancy   . Vertigo    ED evaluation-06/2012    Past Surgical History:  Procedure Laterality Date  . Billroth II    . CHOLECYSTECTOMY    . COLONOSCOPY  Approximately 2000  . COLONOSCOPY  10/2012   Colonoscopy March 2014 at [facility], difficulty exam requiring fluoroscopy and overtube. Patient developed severe bradycardia again during his procedure like he did [facility]. Multiple polyps removed which were tubular adenomas. Previously  tattooed sites at 20 and 30 cm were free of any polypoid change. Left-sided diverticulosis noted.  . COLONOSCOPY WITH ESOPHAGOGASTRODUODENOSCOPY (EGD)  06-17-2009   UXN:[id number] tubular adenomas and 2 tubulovillous adenomas, incomplete, 3 clips placed  . ESOPHAGOGASTRODUODENOSCOPY  06/17/2009   SLF: normal/chronic gastritis (non-h pylori)  . ESOPHAGOGASTRODUODENOSCOPY N/A 05/11/2015   GUR:KYHCWCB anemai due to postgastrectomy state/moderate erosive gastritis  . INGUINAL HERNIA REPAIR     Left  . ROTATOR CUFF REPAIR  1991   Bilateral  . TRANSURETHRAL RESECTION OF PROSTATE  06/2016  . TRANSURETHRAL  RESECTION OF PROSTATE N/A 12/27/2012   Procedure: TRANSURETHRAL RESECTION OF THE PROSTATE (TURP);  Surgeon: Marissa Nestle, MD;  Location: [facility];  Service: Urology;  Laterality: N/A;  . TRANSURETHRAL RESECTION OF PROSTATE N/A 04/11/2016   Procedure: TRANSURETHRAL RESECTION OF THE PROSTATE (TURP);  Surgeon: Irine Seal, MD;  Location: [facility];  Service: Urology;  Laterality: N/A;  . VAGOTOMY AND PYLOROPLASTY  1970s   Details of procedure uncertain; 7628B     Medications: Current Meds  Medication Sig  . clobetasol cream (TEMOVATE) 1.51 % Apply 1 application topically daily as needed (use as directed for skin).   Marland Kitchen clopidogrel (PLAVIX) 75 MG tablet Take 1 tablet (75 mg total) by mouth daily.  . furosemide (LASIX) 40 MG tablet TAKE 1 AND 1/2 TABLETS BY MOUTH EVERY MORNING THEN 1 TABLET BY MOUTH EVERY AFTERNOON (Patient taking differently: Take 80 mg by mouth 2 (two) times daily. 80 mg pobid)  . HYDROcodone-acetaminophen (NORCO) 10-325 MG tablet TAKE 1 TABLET  FIVE TIMES DAILY AS NEEDED FOR PAIN  . hydrOXYzine (ATARAX/VISTARIL) 50 MG tablet Take 1 tablet (50 mg total) by mouth every 6 (six) hours as needed for itching.  . losartan (COZAAR) 100 MG tablet Take 1 tablet (100 mg total) by mouth daily.  . meclizine (ANTIVERT) 25 MG tablet Take 25 mg by mouth at bedtime.   Marland Kitchen nystatin (MYCOSTATIN/NYSTOP) 100000 UNIT/GM POWD Apply 1 g topically daily as needed (rash).   . pantoprazole (PROTONIX) 40 MG tablet Take 1 tablet (40 mg total) by mouth daily.  . potassium chloride SA (KLOR-CON) 20 MEQ tablet Take 1 tablet (20 mEq total) by mouth daily.  . pravastatin (PRAVACHOL) 20 MG tablet TAKE 1 TABLET(20 MG) BY MOUTH DAILY  . triamcinolone cream (KENALOG) 0.1 % Apply 1 application topically daily as needed (for irritation).      Allergies: Allergies  Allergen Reactions  . Contrast Media [Iodinated Diagnostic Agents]     Pt states he was given "dye" 20 yrs ago, anaphylaxis & syncope - was told to never  have it again   . Gabapentin Itching  . Aspirin Other (See Comments)    Blistering   . Latex Rash  . Lipitor [Atorvastatin] Itching  . Tape Rash    EKG leads    Social History: The patient  reports that he quit smoking about 16 years ago. His smoking use included cigars. He quit after 12.00 years of use. He has never used smokeless tobacco. He reports that he does not drink alcohol and does not use drugs.   Family History: The patient's family history includes Allergic rhinitis in his father and mother; Diabetes Mellitus II in his mother; Lung disease in his father.   Review of Systems: Please see the history of present illness.   All other systems are reviewed and negative.   Physical Exam: VS:  BP (!) 120/52   Pulse 69   Ht 6\' 1"  (1.854 m)  Wt 254 lb 9.6 oz (115.5 kg)   SpO2 94%   BMI 33.59 kg/m  .  BMI Body mass index is 33.59 kg/m.  Wt Readings from Last 3 Encounters:  05/10/20 254 lb 9.6 oz (115.5 kg)  04/01/20 256 lb (116.1 kg)  03/29/20 256 lb (116.1 kg)    General: Pleasant. Alert and in no acute distress.  His weight is down 5 more pounds - remains morbidly obese.  Cardiac: Irregular irregular rhythm. His rate is ok.  Chronic 2+edema.  Respiratory:  Lungs are clear to auscultation bilaterally with normal work of breathing.  GI: Soft and nontender.  MS: No deformity or atrophy. Gait not tested.  Skin: Warm and dry. Color is normal.  Neuro:  Strength and sensation are intact and no gross focal deficits noted.  Psych: Alert, appropriate and with normal affect.   LABORATORY DATA:  EKG:  EKG is ordered today.  Personally reviewed by me. This demonstrates AF with anterior Q's - HR is 69. Tracing is unchanged.   Lab Results  Component Value Date   WBC 7.0 03/09/2020   HGB 12.0 (L) 03/09/2020   HCT 38.1 (L) 03/09/2020   PLT 212 03/09/2020   GLUCOSE 165 (H) 03/29/2020   CHOL 168 09/10/2019   TRIG 114 09/10/2019   HDL 46 09/10/2019   LDLCALC 101 (H)  09/10/2019   ALT 7 (L) 09/10/2019   AST 14 09/10/2019   NA 137 03/29/2020   K 3.8 03/29/2020   CL 94 (L) 03/29/2020   CREATININE 1.72 (H) 03/29/2020   BUN 24 03/29/2020   CO2 33 (H) 03/29/2020   TSH 2.995 Test methodology is 3rd generation TSH 06/17/2009   INR 1.19 05/03/2015   HGBA1C 6.7 (H) 02/24/2020     BNP (last 3 results) Recent Labs    02/24/20 1036 03/23/20 1225  BNP 44 55    ProBNP (last 3 results) Recent Labs    05/19/19 1618  PROBNP 340     Other Studies Reviewed Today:  MyoviewStudy Highlights10/2020    Nuclear stress EF: 58%.  The study is normal.  This is a low risk study.  The left ventricular ejection fraction is normal (55-65%).  Normal resting and stress perfusion. No ischemia or infarction EF 58%    EchoStudy Conclusions4/2019  - Left ventricle: The cavity size was normal. Wall thickness was increased in a pattern of mild LVH. Systolic function was normal. The estimated ejection fraction was in the range of 50% to 55%. - Mitral valve: There was mild regurgitation. - Left atrium: The atrium was mildly dilated. - Right ventricle: The cavity size was mildly dilated. Systolic function was moderately reduced. - Right atrium: The atrium was mildly dilated. - Pulmonary arteries: PA peak pressure: 65 mm Hg (S).  Impressions:  - Poor acoustic windows limit study even with Definity use.   Assessment and Plan:  1. Chronic diastolic HF - exacerbated by obesity, sedentary lifestyle and dietary indiscretion.   2. Persistent AF - long standing - rate is controlled - he is not a candidate for [place] due to prior GI bleed.   3. HTN - BP is fine - no changes made today.   4. Prior CVA - he is on Plavix - labs from earlier this summer noted.   5. Obesity -  He is down 5 more pounds. Ongoing struggle  6. OSA  Current medicines are reviewed with the patient today.  The patient does not have concerns regarding medicines  other than  what has been noted above.  The following changes have been made:  See above.  Labs/ tests ordered today include:    Orders Placed This Encounter  Procedures  . EKG 12-Lead     Disposition:   FU with Dr. [name] in 6 months.   Patient is agreeable to this plan and will call if any problems develop in the interim.   SignedTruitt Merle, NP  05/10/2020 3:12 PM  [facility] [address] [place], [place]  [address] Phone: [phone] Fax: [phone]

## 2020-05-10 ENCOUNTER — Other Ambulatory Visit: Payer: Self-pay | Admitting: Family Medicine

## 2020-05-10 ENCOUNTER — Encounter: Payer: Self-pay | Admitting: Nurse Practitioner

## 2020-05-10 ENCOUNTER — Ambulatory Visit (INDEPENDENT_AMBULATORY_CARE_PROVIDER_SITE_OTHER): Payer: PPO | Admitting: Nurse Practitioner

## 2020-05-10 ENCOUNTER — Other Ambulatory Visit: Payer: Self-pay

## 2020-05-10 VITALS — BP 120/52 | HR 69 | Ht 73.0 in | Wt 254.6 lb

## 2020-05-10 DIAGNOSIS — I4821 Permanent atrial fibrillation: Secondary | ICD-10-CM

## 2020-05-10 DIAGNOSIS — Z79899 Other long term (current) drug therapy: Secondary | ICD-10-CM | POA: Diagnosis not present

## 2020-05-10 DIAGNOSIS — I5032 Chronic diastolic (congestive) heart failure: Secondary | ICD-10-CM | POA: Diagnosis not present

## 2020-05-10 DIAGNOSIS — I10 Essential (primary) hypertension: Secondary | ICD-10-CM

## 2020-05-10 MED ORDER — HYDROCODONE-ACETAMINOPHEN 10-325 MG PO TABS: ORAL_TABLET | ORAL | 0 refills | Status: DC

## 2020-05-10 NOTE — Patient Instructions (Addendum)
After Visit Summary:  We will be checking the following labs today - NONE   Medication Instructions:    Continue with your current medicines.    If you need a refill on your cardiac medications before your next appointment, please call your pharmacy.     Testing/Procedures To Be Arranged:  N/A  Follow-Up:   See Dr. [name] in 6 months.     At [facility], you and your health needs are our priority.  As part of our continuing mission to provide you with exceptional heart care, we have created designated Provider Care Teams.  These Care Teams include your primary Cardiologist (physician) and Advanced Practice Providers (APPs -  Physician Assistants and Nurse Practitioners) who all work together to provide you with the care you need, when you need it.  Special Instructions:  . Stay safe, wash your hands for at least 20 seconds and wear a mask when needed.  . It was good to talk with you today.    Call the [place] office at [phone] if you have any questions, problems or concerns.

## 2020-05-10 NOTE — Telephone Encounter (Signed)
Ok to refill??  Last office visit/ refill 04/01/2020.

## 2020-05-10 NOTE — Telephone Encounter (Signed)
Patients wife stopped by requesting a refill on hydrocodone  CB# [phone]

## 2020-05-11 ENCOUNTER — Other Ambulatory Visit: Payer: Self-pay | Admitting: Family Medicine

## 2020-05-11 NOTE — Telephone Encounter (Signed)
Refill Nystop 100,000 um top powder 60 mg

## 2020-05-12 ENCOUNTER — Other Ambulatory Visit: Payer: Self-pay

## 2020-05-12 MED ORDER — NYSTATIN 100000 UNIT/GM EX POWD: 1.0000 "application " | Freq: Every day | CUTANEOUS | 1 refills | Status: DC | PRN

## 2020-05-12 NOTE — Telephone Encounter (Signed)
Prescription sent to pharmacy.

## 2020-05-23 ENCOUNTER — Other Ambulatory Visit: Payer: Self-pay | Admitting: Family Medicine

## 2020-06-10 ENCOUNTER — Encounter: Payer: Self-pay | Admitting: Family Medicine

## 2020-06-10 ENCOUNTER — Other Ambulatory Visit: Payer: Self-pay

## 2020-06-10 ENCOUNTER — Telehealth: Payer: Self-pay | Admitting: Pharmacist

## 2020-06-10 ENCOUNTER — Ambulatory Visit (INDEPENDENT_AMBULATORY_CARE_PROVIDER_SITE_OTHER): Payer: PPO | Admitting: Family Medicine

## 2020-06-10 VITALS — BP 132/64 | HR 82 | Temp 97.6°F | Resp 10 | Ht 72.0 in | Wt 260.0 lb

## 2020-06-10 DIAGNOSIS — I10 Essential (primary) hypertension: Secondary | ICD-10-CM | POA: Diagnosis not present

## 2020-06-10 DIAGNOSIS — Z23 Encounter for immunization: Secondary | ICD-10-CM | POA: Diagnosis not present

## 2020-06-10 DIAGNOSIS — I5020 Unspecified systolic (congestive) heart failure: Secondary | ICD-10-CM

## 2020-06-10 DIAGNOSIS — M25562 Pain in left knee: Secondary | ICD-10-CM | POA: Diagnosis not present

## 2020-06-10 DIAGNOSIS — G8929 Other chronic pain: Secondary | ICD-10-CM

## 2020-06-10 DIAGNOSIS — E118 Type 2 diabetes mellitus with unspecified complications: Secondary | ICD-10-CM | POA: Diagnosis not present

## 2020-06-10 DIAGNOSIS — I4819 Other persistent atrial fibrillation: Secondary | ICD-10-CM

## 2020-06-10 MED ORDER — HYDROCODONE-ACETAMINOPHEN 10-325 MG PO TABS: ORAL_TABLET | ORAL | 0 refills | Status: DC

## 2020-06-10 NOTE — Progress Notes (Signed)
Subjective:    Patient ID: Bernard Ramirez, male    DOB: [birth year], 86 y.o.   MRN: [id number]  Patient is an 86 year old Caucasian male here today for a checkup.  Past medical history significant for permanent atrial fibrillation.  He also has congestive heart failure with an ejection fraction of 50% on an echocardiogram in 2019.  He has bilateral leg swelling today with +2 pitting edema up to his knees.  He is not wearing his compression hose.  He also has right basilar crackles.  He denies any shortness of breath or chest pain however he is extremely sedentary.  He is due for a flu shot.  He also complains of left knee pain and is requesting a cortisone injection in his left knee.  He is not on anticoagulants due to a history of a GI bleed although he does take Plavix.  He denies any melena or hematochezia.  He is overdue to check fasting lab work.  Past Medical History:  Diagnosis Date  . Anemia    04/2012: H&H-10.2/31.5, MCV-77; 06/2012:12/38.9  . Arthritis   . Benign prostatic hypertrophy    s/p transurethral resection of the prostate  . Chest pain    2004; with dyspnea, stress nuclear-normal EF + questionable inferior wall ischemia, normal coronary angiography;  2010-negative stress nuclear  . CVA (cerebral infarction)    asymptomatic (seen on CT)  . Diarrhea    h/o Hemoccult-positive stool  . Dysrhythmia   . Edema of both legs   . GERD (gastroesophageal reflux disease)   . History of blood transfusion   . History of kidney stones   . History of urinary tract infection   . Hypertension   . Jerking movements of extremities    left arm   . Low back pain   . Obstructive sleep apnea    pt states can not use the CPAP  . Peripheral neuropathy    lower legs and feet bilat   . Persistent atrial fibrillation ([place]) 2013   Asymptomatic; diagnosed in 05/2012  . Pneumonia   . Prostate cancer ([place]) 2017   stage 4  . Shortness of breath dyspnea    pt states only develops SOB if tries  to do something too quickly  . Upper GI bleed 1985   1985-peptic ulcer disease; initial hemigastrectomy; subsequent Billroth II  . Urinary hesitancy   . Vertigo    ED evaluation-06/2012   Past Surgical History:  Procedure Laterality Date  . Billroth II    . CHOLECYSTECTOMY    . COLONOSCOPY  Approximately 2000  . COLONOSCOPY  10/2012   Colonoscopy March 2014 at [facility], difficulty exam requiring fluoroscopy and overtube. Patient developed severe bradycardia again during his procedure like he did [facility]. Multiple polyps removed which were tubular adenomas. Previously tattooed sites at 20 and 30 cm were free of any polypoid change. Left-sided diverticulosis noted.  . COLONOSCOPY WITH ESOPHAGOGASTRODUODENOSCOPY (EGD)  06-17-2009   KYH:CWCBJSEG tubular adenomas and 2 tubulovillous adenomas, incomplete, 3 clips placed  . ESOPHAGOGASTRODUODENOSCOPY  06/17/2009   SLF: normal/chronic gastritis (non-h pylori)  . ESOPHAGOGASTRODUODENOSCOPY N/A 05/11/2015   BTD:VVOHYWV anemai due to postgastrectomy state/moderate erosive gastritis  . INGUINAL HERNIA REPAIR     Left  . ROTATOR CUFF REPAIR  1991   Bilateral  . TRANSURETHRAL RESECTION OF PROSTATE  06/2016  . TRANSURETHRAL RESECTION OF PROSTATE N/A 12/27/2012   Procedure: TRANSURETHRAL RESECTION OF THE PROSTATE (TURP);  Surgeon: Marissa Nestle, MD;  Location: [facility];  Service: Urology;  Laterality: N/A;  . TRANSURETHRAL RESECTION OF PROSTATE N/A 04/11/2016   Procedure: TRANSURETHRAL RESECTION OF THE PROSTATE (TURP);  Surgeon: Irine Seal, MD;  Location: [facility];  Service: Urology;  Laterality: N/A;  . VAGOTOMY AND PYLOROPLASTY  1970s   Details of procedure uncertain; 8676H   Current Outpatient Medications on File Prior to Visit  Medication Sig Dispense Refill  . clobetasol cream (TEMOVATE) 2.09 % Apply 1 application topically daily as needed (use as directed for skin).     Marland Kitchen clopidogrel (PLAVIX) 75 MG tablet Take 1 tablet (75 mg total) by  mouth daily. 90 tablet 3  . furosemide (LASIX) 40 MG tablet TAKE 1 AND 1/2 TABLETS BY MOUTH EVERY MORNING THEN TAKE 1 TABLET BY MOUTH EVERY AFTERNOON (Patient taking differently: TAKE 2 TABLETS BY MOUTH EVERY MORNING THEN TAKE 1 TABLET BY MOUTH EVERY AFTERNOON) 225 tablet 1  . HYDROcodone-acetaminophen (NORCO) 10-325 MG tablet TAKE 1 TABLET  FIVE TIMES DAILY AS NEEDED FOR PAIN 150 tablet 0  . losartan (COZAAR) 100 MG tablet Take 1 tablet (100 mg total) by mouth daily. 90 tablet 2  . meclizine (ANTIVERT) 25 MG tablet Take 25 mg by mouth at bedtime.     Marland Kitchen nystatin (NYSTATIN) powder Apply 1 application topically daily as needed (rash). 60 g 1  . pantoprazole (PROTONIX) 40 MG tablet Take 1 tablet (40 mg total) by mouth daily. 90 tablet 2  . potassium chloride SA (KLOR-CON) 20 MEQ tablet Take 1 tablet (20 mEq total) by mouth daily. 30 tablet 6  . pravastatin (PRAVACHOL) 20 MG tablet TAKE 1 TABLET(20 MG) BY MOUTH DAILY 90 tablet 2  . triamcinolone cream (KENALOG) 0.1 % Apply 1 application topically daily as needed (for irritation).     . hydrOXYzine (ATARAX/VISTARIL) 50 MG tablet Take 1 tablet (50 mg total) by mouth every 6 (six) hours as needed for itching. (Patient not taking: Reported on 06/10/2020) 30 tablet 0   No current facility-administered medications on file prior to visit.   Allergies  Allergen Reactions  . Contrast Media [Iodinated Diagnostic Agents]     Pt states he was given "dye" 20 yrs ago, anaphylaxis & syncope - was told to never have it again   . Gabapentin Itching  . Aspirin Other (See Comments)    Blistering   . Latex Rash  . Lipitor [Atorvastatin] Itching  . Tape Rash    EKG leads   Social History   Socioeconomic History  . Marital status: Married    Spouse name: Not on file  . Number of children: 5  . Years of education: Not on file  . Highest education level: Not on file  Occupational History  . Occupation: Retired Pensions consultant: RETIRED   Tobacco Use  . Smoking status: Former Smoker    Years: 12.00    Types: Cigars    Quit date: 08/22/2003    Years since quitting: 16.8  . Smokeless tobacco: Never Used  . Tobacco comment: Quit many years ago; few cigars per day; didn't inhale  Vaping Use  . Vaping Use: Never used  Substance and Sexual Activity  . Alcohol use: No    Alcohol/week: 1.0 standard drink    Types: 1 Standard drinks or equivalent per week  . Drug use: No  . Sexual activity: Not on file  Other Topics Concern  . Not on file  Social History Narrative   Lives w/ wife in [place]   Retired  truck driver   Social Determinants of Radio broadcast assistant Strain: Low Risk   . Difficulty of Paying Living Expenses: Not hard at all  Food Insecurity:   . Worried About Charity fundraiser in the Last Year: Not on file  . Ran Out of Food in the Last Year: Not on file  Transportation Needs:   . Lack of Transportation (Medical): Not on file  . Lack of Transportation (Non-Medical): Not on file  Physical Activity:   . Days of Exercise per Week: Not on file  . Minutes of Exercise per Session: Not on file  Stress:   . Feeling of Stress : Not on file  Social Connections:   . Frequency of Communication with Friends and Family: Not on file  . Frequency of Social Gatherings with Friends and Family: Not on file  . Attends Religious Services: Not on file  . Active Member of Clubs or Organizations: Not on file  . Attends Archivist Meetings: Not on file  . Marital Status: Not on file  Intimate Partner Violence:   . Fear of Current or Ex-Partner: Not on file  . Emotionally Abused: Not on file  . Physically Abused: Not on file  . Sexually Abused: Not on file      Review of Systems  All other systems reviewed and are negative.      Objective:   Physical Exam Vitals reviewed.  Constitutional:      General: He is not in acute distress.    Appearance: Normal appearance. He is obese. He is not  ill-appearing or toxic-appearing.  Cardiovascular:     Rate and Rhythm: Normal rate. Rhythm irregular.     Heart sounds: Normal heart sounds. No murmur heard.  No friction rub. No gallop.   Pulmonary:     Effort: Pulmonary effort is normal. No respiratory distress.     Breath sounds: Normal breath sounds. No stridor. No wheezing, rhonchi or rales.  Abdominal:     General: Abdomen is flat. Bowel sounds are normal. There is no distension.     Palpations: Abdomen is soft. There is no mass.     Tenderness: There is no abdominal tenderness. There is no guarding.  Musculoskeletal:     Right lower leg: 2+ Edema present.     Left lower leg: 2+ Edema present.     Right foot: Decreased range of motion. Swelling and deformity present.  Skin:    Findings: No erythema.  Neurological:     General: No focal deficit present.     Mental Status: He is alert and oriented to person, place, and time.     Cranial Nerves: No cranial nerve deficit.     Sensory: No sensory deficit.     Motor: No weakness.     Coordination: Coordination normal.     Gait: Gait abnormal (Shuffling gait.  Requires a cane to ambulate).     Deep Tendon Reflexes: Reflexes normal.  Psychiatric:        Mood and Affect: Mood normal.        Thought Content: Thought content normal.        Judgment: Judgment normal.           Assessment & Plan:  Systolic congestive heart failure, unspecified HF chronicity ([place])  Essential hypertension  Controlled type 2 diabetes mellitus with complication, without long-term current use of insulin ([place]) - Plan: CBC with Differential/Platelet, Brain natriuretic peptide, COMPLETE METABOLIC PANEL WITH GFR, Lipid panel,  Hemoglobin A1c  Persistent atrial fibrillation (H[facility])  Chronic pain of left knee  Patient is fluid overloaded.  Increase Lasix to 80 mg twice daily and continue that.  Blood pressures acceptable.  Weight is up 6 pounds from his last office visit and I believe this is most likely  due to the edema in his legs.  Monitor a CMP to monitor his renal function as well as potassium.  Check fasting lipid panel.  Goal LDL cholesterol is less than 70 if possible.  Check A1c.  Goal A1c is less than 7.  Using sterile technique, I injected the left knee with 2 cc lidocaine, 2 cc of Marcaine, and 2 cc of 40 mg/mL Kenalog.  Patient tolerated the procedure well without complication.  I will refill his pain medication that he takes.

## 2020-06-10 NOTE — Progress Notes (Signed)
Chronic Care Management Pharmacy Assistant   Name: Bernard Ramirez  MRN: [id number] DOB: 06/26/1931  Reason for Encounter: Disease State  Patient Questions:  1.  Have you seen any other providers since your last visit? Yes  2.  Any changes in your medicines or health? Yes     PCP : [name], MD   Their chronic conditions include: hypertension, afib, CHF, CKD, hyperlipidemia  Office Visits: 02-24-2020 (PCP) Patient presented in the office for cellulitis f/u. Weight essentially remained the same at 260 pounds despite wearing Unna boots and increasing his Lasix dose.  Patient is still taking 80 mg twice a day.  Ace wraps were removed at the time of the visit from his legs.  He still has +2 pitting edema.  However the erythema has faded from both legs.   Cellulitis seemed to have resolved.  He was not wearing any compression hose .  Zaroxolyn 2.5 mg 30 minutes prior to his Lasix was added to medication list.   02-19-2020 (PCP) Leg swelling f/u. He has continue to take 80mg  Lasix in AM and PM, Keflex as prescribed, however he did not purchase SCD, compression hose, or change his wrapping since the visit as instructed. He is drinking at least 2 sodas (mountain dew a day) his wife says non compliant with diet and drinking lemonade and other things he should not do and he is very stubborn doing what he likes. Patient's sxs have worsened. Discussed IM Rocephin today however not available in clinic will cover with 5 additional days of extended Keflex orally and follow up on Tuesday  02-16-2020 (PCP) Leg swelling f/u. Patient's sxs have improved and was advised to continue antibiotics and diuretic. Was advised to follow 2 Gram sodium diet restriction in 24 hour, 1500 ml fluid restriction over 24 hour, Elevated lower extremities while resting and often to reduce edema, Wear compression device such as hose, Sequential compression device, wrap as in clinic to reduce edema and avoid  sodas  02-13-2020 (PCP): Patient presented in the office to establish care . Was recently transferred from under the care of Dr. Luan Pulling. Due to cellulitis and abscess of the left leg, Dr. Dennard Schaumann increased Lasix to 2 tablets twice daily through the weeken. Patient was to recheck in the office on Monday.  The patient was placed in Unna boots bilaterally and I will start the patient on Keflex 500 mg 3 times daily for 7 days for secondary cellulitis in the left leg.  Recheck with my partner on Monday   Allergies:   Allergies  Allergen Reactions  . Contrast Media [Iodinated Diagnostic Agents]     Pt states he was given "dye" 20 yrs ago, anaphylaxis & syncope - was told to never have it again   . Gabapentin Itching  . Aspirin Other (See Comments)    Blistering   . Latex Rash  . Lipitor [Atorvastatin] Itching  . Tape Rash    EKG leads    Medications: Outpatient Encounter Medications as of 06/10/2020  Medication Sig  . clobetasol cream (TEMOVATE) 8.83 % Apply 1 application topically daily as needed (use as directed for skin).   Marland Kitchen clopidogrel (PLAVIX) 75 MG tablet Take 1 tablet (75 mg total) by mouth daily.  . furosemide (LASIX) 40 MG tablet TAKE 1 AND 1/2 TABLETS BY MOUTH EVERY MORNING THEN TAKE 1 TABLET BY MOUTH EVERY AFTERNOON (Patient taking differently: TAKE 2 TABLETS BY MOUTH EVERY MORNING THEN TAKE 1 TABLET BY MOUTH EVERY AFTERNOON)  .  HYDROcodone-acetaminophen (NORCO) 10-325 MG tablet TAKE 1 TABLET  FIVE TIMES DAILY AS NEEDED FOR PAIN  . hydrOXYzine (ATARAX/VISTARIL) 50 MG tablet Take 1 tablet (50 mg total) by mouth every 6 (six) hours as needed for itching. (Patient not taking: Reported on 06/10/2020)  . losartan (COZAAR) 100 MG tablet Take 1 tablet (100 mg total) by mouth daily.  . meclizine (ANTIVERT) 25 MG tablet Take 25 mg by mouth at bedtime.   Marland Kitchen nystatin (NYSTATIN) powder Apply 1 application topically daily as needed (rash).  . pantoprazole (PROTONIX) 40 MG tablet Take 1 tablet  (40 mg total) by mouth daily.  . potassium chloride SA (KLOR-CON) 20 MEQ tablet Take 1 tablet (20 mEq total) by mouth daily.  . pravastatin (PRAVACHOL) 20 MG tablet TAKE 1 TABLET(20 MG) BY MOUTH DAILY  . triamcinolone cream (KENALOG) 0.1 % Apply 1 application topically daily as needed (for irritation).    No facility-administered encounter medications on file as of 06/10/2020.    Current Diagnosis: Patient Active Problem List   Diagnosis Date Noted  . Cellulitis of lower extremity 10/08/2016  . Congestive heart failure (CHF) ([place]) 10/08/2016  . Acute kidney injury ([place]) 10/08/2016  . Acute renal insufficiency due to procedure 04/13/2016  . BPH (benign prostatic hypertrophy) with urinary obstruction 04/11/2016  . Shortness of breath 09/27/2015  . Preop cardiovascular exam 09/27/2015  . Melena 05/11/2015  . Rectal bleeding 05/11/2015  . Abnormal x-ray of abdomen 05/11/2015  . Absolute anemia   . ED (erectile dysfunction) of organic origin 11/06/2014  . Urine stream spraying 11/06/2014  . Benign non-nodular prostatic hyperplasia with lower urinary tract symptoms 11/06/2014  . Increased frequency of urination 10/12/2014  . 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 Urinary urgency 10/12/2014  . Atrial fibrillation by electrocardiogram ([place]) 11/08/2012  . Elevated blood pressure 11/08/2012  . Former smoker 11/08/2012  . Chronic kidney disease, stage II (mild) 07/11/2012  . Fasting hyperglycemia 07/11/2012  . Chest pain   . Vertigo   . Atrial fibrillation ([place])   . Anemia 06/10/2009  . Essential hypertension 06/10/2009  . DIARRHEA, CHRONIC 06/10/2009  . PEPTIC ULCER, ACUTE, HEMORRHAGE, HX OF 06/10/2009  . DIVERTICULITIS, HX OF 06/10/2009    Goals Addressed   None    Made contact with the patient's wife for General Adherence check. [name] indicated the patient is doing well and has started to wear his compression stockings. Dr. Dennard Schaumann has increased lasix  to three  pills in the morning and one pill in the evening. Patient is tolerating this well and swelling has gone down some. She reports her stepson helped him put on the compression stocking last Thursday and have not been removed since. [name] states she is not able to put the compression stockings back on once they are off.  Follow-Up:  Pharmacist Review and Scheduled Follow-Up With Clinical Pharmacist January 24th at 1pm in the office.  Fanny Skates, [place] Pharmacist Assistant [phone]

## 2020-07-05 ENCOUNTER — Other Ambulatory Visit: Payer: Self-pay | Admitting: Family Medicine

## 2020-07-05 MED ORDER — NYSTATIN 100000 UNIT/GM EX POWD: 1.0000 "application " | Freq: Every day | CUTANEOUS | 3 refills | Status: DC | PRN

## 2020-07-05 NOTE — Telephone Encounter (Signed)
Prescription sent to pharmacy.

## 2020-07-05 NOTE — Telephone Encounter (Signed)
Refill Nystop Top PWDR 100,000 85M

## 2020-07-12 ENCOUNTER — Other Ambulatory Visit: Payer: Self-pay | Admitting: Family Medicine

## 2020-07-12 MED ORDER — HYDROCODONE-ACETAMINOPHEN 10-325 MG PO TABS: ORAL_TABLET | ORAL | 0 refills | Status: DC

## 2020-07-12 NOTE — Telephone Encounter (Signed)
Refill- Hydrocodone

## 2020-07-23 ENCOUNTER — Ambulatory Visit: Payer: PPO | Admitting: Family Medicine

## 2020-07-27 ENCOUNTER — Other Ambulatory Visit: Payer: Self-pay

## 2020-07-27 ENCOUNTER — Other Ambulatory Visit: Payer: Self-pay | Admitting: Family Medicine

## 2020-07-27 ENCOUNTER — Ambulatory Visit (INDEPENDENT_AMBULATORY_CARE_PROVIDER_SITE_OTHER): Payer: PPO | Admitting: Family Medicine

## 2020-07-27 VITALS — BP 110/62 | HR 86 | Temp 97.6°F | Ht 73.0 in | Wt 241.0 lb

## 2020-07-27 DIAGNOSIS — M7989 Other specified soft tissue disorders: Secondary | ICD-10-CM | POA: Diagnosis not present

## 2020-07-27 MED ORDER — POTASSIUM CHLORIDE ER 10 MEQ PO CPCR: 20.0000 meq | ORAL_CAPSULE | Freq: Every day | ORAL | 1 refills | Status: DC

## 2020-07-27 NOTE — Progress Notes (Signed)
Subjective:    Patient ID: DAQUAVION CATALA, male    DOB: [birth year], 86 y.o.   MRN: [id number]   06/10/20 Patient is an 86 year old Caucasian male here today for a checkup.  Past medical history significant for permanent atrial fibrillation.  He also has congestive heart failure with an ejection fraction of 50% on an echocardiogram in 2019.  He has bilateral leg swelling today with +2 pitting edema up to his knees.  He is not wearing his compression hose.  He also has right basilar crackles.  He denies any shortness of breath or chest pain however he is extremely sedentary.  He is due for a flu shot.  He also complains of left knee pain and is requesting a cortisone injection in his left knee.  He is not on anticoagulants due to a history of a GI bleed although he does take Plavix.  He denies any melena or hematochezia.  He is overdue to check fasting lab work.  At that time, my plan was: Patient is fluid overloaded.  Increase Lasix to 80 mg twice daily and continue that.  Blood pressures acceptable.  Weight is up 6 pounds from his last office visit and I believe this is most likely due to the edema in his legs.  Monitor a CMP to monitor his renal function as well as potassium.  Check fasting lipid panel.  Goal LDL cholesterol is less than 70 if possible.  Check A1c.  Goal A1c is less than 7.  Using sterile technique, I injected the left knee with 2 cc lidocaine, 2 cc of Marcaine, and 2 cc of 40 mg/mL Kenalog.  Patient tolerated the procedure well without complication.  I will refill his pain medication that he takes.  07/27/20  Patient has been wearing his compression hose every day!  His weight has dropped 20 pounds!  However from wearing the compression hose he has developed irritation and tenderness over the lateral aspect of the interphalangeal joint of the fifth toe bilaterally.  He is developing a callus or corn in that area.  This is due to the compression of the socks and how tight they  are. Past Medical History:  Diagnosis Date  . Anemia    04/2012: H&H-10.2/31.5, MCV-77; 06/2012:12/38.9  . Arthritis   . Benign prostatic hypertrophy    s/p transurethral resection of the prostate  . Chest pain    2004; with dyspnea, stress nuclear-normal EF + questionable inferior wall ischemia, normal coronary angiography;  2010-negative stress nuclear  . CVA (cerebral infarction)    asymptomatic (seen on CT)  . Diarrhea    h/o Hemoccult-positive stool  . Dysrhythmia   . Edema of both legs   . GERD (gastroesophageal reflux disease)   . History of blood transfusion   . History of kidney stones   . History of urinary tract infection   . Hypertension   . Jerking movements of extremities    left arm   . Low back pain   . Obstructive sleep apnea    pt states can not use the CPAP  . Peripheral neuropathy    lower legs and feet bilat   . Persistent atrial fibrillation ([place]) 2013   Asymptomatic; diagnosed in 05/2012  . Pneumonia   . Prostate cancer ([place]) 2017   stage 4  . Shortness of breath dyspnea    pt states only develops SOB if tries to do something too quickly  . Upper GI bleed 1985   1985-peptic ulcer  disease; initial hemigastrectomy; subsequent Billroth II  . Urinary hesitancy   . Vertigo    ED evaluation-06/2012   Past Surgical History:  Procedure Laterality Date  . Billroth II    . CHOLECYSTECTOMY    . COLONOSCOPY  Approximately 2000  . COLONOSCOPY  10/2012   Colonoscopy March 2014 at [facility], difficulty exam requiring fluoroscopy and overtube. Patient developed severe bradycardia again during his procedure like he did [facility]. Multiple polyps removed which were tubular adenomas. Previously tattooed sites at 20 and 30 cm were free of any polypoid change. Left-sided diverticulosis noted.  . COLONOSCOPY WITH ESOPHAGOGASTRODUODENOSCOPY (EGD)  06-17-2009   FOY:DXAJOINO tubular adenomas and 2 tubulovillous adenomas, incomplete, 3 clips placed  .  ESOPHAGOGASTRODUODENOSCOPY  06/17/2009   SLF: normal/chronic gastritis (non-h pylori)  . ESOPHAGOGASTRODUODENOSCOPY N/A 05/11/2015   MVE:HMCNOBS anemai due to postgastrectomy state/moderate erosive gastritis  . INGUINAL HERNIA REPAIR     Left  . ROTATOR CUFF REPAIR  1991   Bilateral  . TRANSURETHRAL RESECTION OF PROSTATE  06/2016  . TRANSURETHRAL RESECTION OF PROSTATE N/A 12/27/2012   Procedure: TRANSURETHRAL RESECTION OF THE PROSTATE (TURP);  Surgeon: Marissa Nestle, MD;  Location: [facility];  Service: Urology;  Laterality: N/A;  . TRANSURETHRAL RESECTION OF PROSTATE N/A 04/11/2016   Procedure: TRANSURETHRAL RESECTION OF THE PROSTATE (TURP);  Surgeon: Irine Seal, MD;  Location: [facility];  Service: Urology;  Laterality: N/A;  . VAGOTOMY AND PYLOROPLASTY  1970s   Details of procedure uncertain; 9628Z   Current Outpatient Medications on File Prior to Visit  Medication Sig Dispense Refill  . clobetasol cream (TEMOVATE) 6.62 % Apply 1 application topically daily as needed (use as directed for skin).     Marland Kitchen clopidogrel (PLAVIX) 75 MG tablet Take 1 tablet (75 mg total) by mouth daily. 90 tablet 3  . furosemide (LASIX) 40 MG tablet TAKE 1 AND 1/2 TABLETS BY MOUTH EVERY MORNING THEN TAKE 1 TABLET BY MOUTH EVERY AFTERNOON (Patient taking differently: TAKE 2 TABLETS BY MOUTH EVERY MORNING THEN TAKE 1 TABLET BY MOUTH EVERY AFTERNOON) 225 tablet 1  . HYDROcodone-acetaminophen (NORCO) 10-325 MG tablet TAKE 1 TABLET  FIVE TIMES DAILY AS NEEDED FOR PAIN 150 tablet 0  . hydrOXYzine (ATARAX/VISTARIL) 50 MG tablet Take 1 tablet (50 mg total) by mouth every 6 (six) hours as needed for itching. (Patient not taking: Reported on 06/10/2020) 30 tablet 0  . losartan (COZAAR) 100 MG tablet Take 1 tablet (100 mg total) by mouth daily. 90 tablet 2  . meclizine (ANTIVERT) 25 MG tablet Take 25 mg by mouth at bedtime.     Marland Kitchen nystatin (NYSTATIN) powder Apply 1 application topically daily as needed (rash). 60 g 3  . pantoprazole  (PROTONIX) 40 MG tablet Take 1 tablet (40 mg total) by mouth daily. 90 tablet 2  . potassium chloride SA (KLOR-CON) 20 MEQ tablet Take 1 tablet (20 mEq total) by mouth daily. 30 tablet 6  . pravastatin (PRAVACHOL) 20 MG tablet TAKE 1 TABLET(20 MG) BY MOUTH DAILY 90 tablet 2  . triamcinolone cream (KENALOG) 0.1 % Apply 1 application topically daily as needed (for irritation).      No current facility-administered medications on file prior to visit.   Allergies  Allergen Reactions  . Contrast Media [Iodinated Diagnostic Agents]     Pt states he was given "dye" 20 yrs ago, anaphylaxis & syncope - was told to never have it again   . Gabapentin Itching  . Aspirin Other (See Comments)  Blistering   . Latex Rash  . Lipitor [Atorvastatin] Itching  . Tape Rash    EKG leads   Social History   Socioeconomic History  . Marital status: Married    Spouse name: Not on file  . Number of children: 5  . Years of education: Not on file  . Highest education level: Not on file  Occupational History  . Occupation: Retired Pensions consultant: RETIRED  Tobacco Use  . Smoking status: Former Smoker    Years: 12.00    Types: Cigars    Quit date: 08/22/2003    Years since quitting: 16.9  . Smokeless tobacco: Never Used  . Tobacco comment: Quit many years ago; few cigars per day; didn't inhale  Vaping Use  . Vaping Use: Never used  Substance and Sexual Activity  . Alcohol use: No    Alcohol/week: 1.0 standard drink    Types: 1 Standard drinks or equivalent per week  . Drug use: No  . Sexual activity: Not on file  Other Topics Concern  . Not on file  Social History Narrative   Lives w/ wife in [place]   Retired truck Geophysicist/field seismologist   Social Determinants of Health   Financial Resource Strain: [place]   . Difficulty of Paying Living Expenses: Not hard at all  Food Insecurity:   . Worried About Charity fundraiser in the Last Year: Not on file  . Ran Out of Food in the Last Year: Not  on file  Transportation Needs:   . Lack of Transportation (Medical): Not on file  . Lack of Transportation (Non-Medical): Not on file  Physical Activity:   . Days of Exercise per Week: Not on file  . Minutes of Exercise per Session: Not on file  Stress:   . Feeling of Stress : Not on file  Social Connections:   . Frequency of Communication with Friends and Family: Not on file  . Frequency of Social Gatherings with Friends and Family: Not on file  . Attends Religious Services: Not on file  . Active Member of Clubs or Organizations: Not on file  . Attends Archivist Meetings: Not on file  . Marital Status: Not on file  Intimate Partner Violence:   . Fear of Current or Ex-Partner: Not on file  . Emotionally Abused: Not on file  . Physically Abused: Not on file  . Sexually Abused: Not on file      Review of Systems  All other systems reviewed and are negative.      Objective:   Physical Exam Vitals reviewed.  Constitutional:      General: He is not in acute distress.    Appearance: Normal appearance. He is obese. He is not ill-appearing or toxic-appearing.  Cardiovascular:     Rate and Rhythm: Normal rate. Rhythm irregular.     Heart sounds: Normal heart sounds. No murmur heard.  No friction rub. No gallop.   Pulmonary:     Effort: Pulmonary effort is normal. No respiratory distress.     Breath sounds: Normal breath sounds. No stridor. No wheezing, rhonchi or rales.  Abdominal:     General: Abdomen is flat. Bowel sounds are normal. There is no distension.     Palpations: Abdomen is soft. There is no mass.     Tenderness: There is no abdominal tenderness. There is no guarding.  Musculoskeletal:     Right lower leg: No edema.     Left lower  leg: No edema.     Right foot: Decreased range of motion. Swelling and deformity present.  Skin:    Findings: No erythema.  Neurological:     General: No focal deficit present.     Mental Status: He is alert and oriented  to person, place, and time.     Cranial Nerves: No cranial nerve deficit.     Sensory: No sensory deficit.     Motor: No weakness.     Coordination: Coordination normal.     Gait: Gait abnormal (Shuffling gait.  Requires a cane to ambulate).     Deep Tendon Reflexes: Reflexes normal.  Psychiatric:        Mood and Affect: Mood normal.        Thought Content: Thought content normal.        Judgment: Judgment normal.     Edema in his legs is much improved.  He has trace bipedal edema from wearing his socks      Assessment & Plan:  Leg swelling  Leg swelling is dramatically better.  I congratulated the patient on wearing his compression hose and encouraged him to keep up the good work.  He appears euvolemic today and his dose of Lasix appears adequate.  However I encouraged the patient to get open toe compression hose so that its not causing friction and irritation over the lateral aspect of the fifth interphalangeal joint bilaterally

## 2020-07-29 ENCOUNTER — Other Ambulatory Visit: Payer: Self-pay | Admitting: Family Medicine

## 2020-08-03 ENCOUNTER — Other Ambulatory Visit: Payer: Self-pay | Admitting: Family Medicine

## 2020-08-12 ENCOUNTER — Other Ambulatory Visit: Payer: Self-pay | Admitting: Family Medicine

## 2020-08-12 MED ORDER — HYDROCODONE-ACETAMINOPHEN 10-325 MG PO TABS: ORAL_TABLET | ORAL | 0 refills | Status: DC

## 2020-08-12 NOTE — Telephone Encounter (Signed)
Patient's wife requesting a refill on hydrocodone called into [facility] [address]  CB# [phone]

## 2020-08-16 ENCOUNTER — Other Ambulatory Visit: Payer: Self-pay | Admitting: Family Medicine

## 2020-09-13 ENCOUNTER — Ambulatory Visit (INDEPENDENT_AMBULATORY_CARE_PROVIDER_SITE_OTHER): Payer: PPO | Admitting: Family Medicine

## 2020-09-13 ENCOUNTER — Encounter: Payer: Self-pay | Admitting: Family Medicine

## 2020-09-13 ENCOUNTER — Other Ambulatory Visit: Payer: Self-pay | Admitting: *Deleted

## 2020-09-13 ENCOUNTER — Other Ambulatory Visit: Payer: Self-pay

## 2020-09-13 VITALS — BP 110/72 | Temp 98.0°F | Ht 72.0 in | Wt 240.0 lb

## 2020-09-13 DIAGNOSIS — I10 Essential (primary) hypertension: Secondary | ICD-10-CM

## 2020-09-13 DIAGNOSIS — M7989 Other specified soft tissue disorders: Secondary | ICD-10-CM | POA: Diagnosis not present

## 2020-09-13 DIAGNOSIS — E118 Type 2 diabetes mellitus with unspecified complications: Secondary | ICD-10-CM | POA: Diagnosis not present

## 2020-09-13 DIAGNOSIS — I5020 Unspecified systolic (congestive) heart failure: Secondary | ICD-10-CM | POA: Diagnosis not present

## 2020-09-13 DIAGNOSIS — I4819 Other persistent atrial fibrillation: Secondary | ICD-10-CM | POA: Diagnosis not present

## 2020-09-13 DIAGNOSIS — M25561 Pain in right knee: Secondary | ICD-10-CM

## 2020-09-13 DIAGNOSIS — M25562 Pain in left knee: Secondary | ICD-10-CM | POA: Diagnosis not present

## 2020-09-13 DIAGNOSIS — G8929 Other chronic pain: Secondary | ICD-10-CM

## 2020-09-13 MED ORDER — HYDROCODONE-ACETAMINOPHEN 10-325 MG PO TABS: ORAL_TABLET | ORAL | 0 refills | Status: DC

## 2020-09-13 NOTE — Telephone Encounter (Signed)
Please re-submit as transmission to pharmacy failed.

## 2020-09-13 NOTE — Progress Notes (Signed)
Subjective:    Patient ID: Bernard Ramirez, male    DOB: [birth year], 87 y.o.   MRN: [id number]  Patient is an 87 year old Caucasian male here today for a checkup.  Past medical history significant for permanent atrial fibrillation.  He also has congestive heart failure with an ejection fraction of 50% on an echocardiogram in 2019.   He is not on anticoagulants due to a history of a GI bleed although he does take Plavix. Patient continues to complain of bilateral knee pain. He is requesting a cortisone injection in both knees. He states that he gets roughly 30 to 60 days of relief from each cortisone injection. That alleviates his pain somewhat so that he can walk better around his home. It has been more than 3 months since his last injection. Of note he has lost an additional 20 pounds of fluid in both legs. The swelling is down remarkably since his last visit. He is taking the same dose of diuretic however he has been very consistent about wearing his compression hose. I am concerned because his creatinine in October was 1.7. I am worried about prerenal azotemia and dehydration due to the substantial weight loss in the last 3 months. He denies any dizziness or lightheadedness. He denies any chest pain or shortness of breath or dyspnea on exertion  Past Medical History:  Diagnosis Date  . Anemia    04/2012: H&H-10.2/31.5, MCV-77; 06/2012:12/38.9  . Arthritis   . Benign prostatic hypertrophy    s/p transurethral resection of the prostate  . Chest pain    2004; with dyspnea, stress nuclear-normal EF + questionable inferior wall ischemia, normal coronary angiography;  2010-negative stress nuclear  . CVA (cerebral infarction)    asymptomatic (seen on CT)  . Diarrhea    h/o Hemoccult-positive stool  . Dysrhythmia   . Edema of both legs   . GERD (gastroesophageal reflux disease)   . History of blood transfusion   . History of kidney stones   . History of urinary tract infection   . Hypertension    . Jerking movements of extremities    left arm   . Low back pain   . Obstructive sleep apnea    pt states can not use the CPAP  . Peripheral neuropathy    lower legs and feet bilat   . Persistent atrial fibrillation ([place]) 2013   Asymptomatic; diagnosed in 05/2012  . Pneumonia   . Prostate cancer ([place]) 2017   stage 4  . Shortness of breath dyspnea    pt states only develops SOB if tries to do something too quickly  . Upper GI bleed 1985   1985-peptic ulcer disease; initial hemigastrectomy; subsequent Billroth II  . Urinary hesitancy   . Vertigo    ED evaluation-06/2012   Past Surgical History:  Procedure Laterality Date  . Billroth II    . CHOLECYSTECTOMY    . COLONOSCOPY  Approximately 2000  . COLONOSCOPY  10/2012   Colonoscopy March 2014 at [facility], difficulty exam requiring fluoroscopy and overtube. Patient developed severe bradycardia again during his procedure like he did [facility]. Multiple polyps removed which were tubular adenomas. Previously tattooed sites at 20 and 30 cm were free of any polypoid change. Left-sided diverticulosis noted.  . COLONOSCOPY WITH ESOPHAGOGASTRODUODENOSCOPY (EGD)  06-17-2009   [id number] tubular adenomas and 2 tubulovillous adenomas, incomplete, 3 clips placed  . ESOPHAGOGASTRODUODENOSCOPY  06/17/2009   SLF: normal/chronic gastritis (non-h pylori)  . ESOPHAGOGASTRODUODENOSCOPY N/A 05/11/2015  [id number] anemai due to postgastrectomy state/moderate erosive gastritis  . INGUINAL HERNIA REPAIR     Left  . ROTATOR CUFF REPAIR  1991   Bilateral  . TRANSURETHRAL RESECTION OF PROSTATE  06/2016  . TRANSURETHRAL RESECTION OF PROSTATE N/A 12/27/2012   Procedure: TRANSURETHRAL RESECTION OF THE PROSTATE (TURP);  Surgeon: Marissa Nestle, MD;  Location: [facility];  Service: Urology;  Laterality: N/A;  . TRANSURETHRAL RESECTION OF PROSTATE N/A 04/11/2016   Procedure: TRANSURETHRAL RESECTION OF THE PROSTATE (TURP);  Surgeon: Irine Seal, MD;   Location: [facility];  Service: Urology;  Laterality: N/A;  . VAGOTOMY AND PYLOROPLASTY  1970s   Details of procedure uncertain; 1751W   Current Outpatient Medications on File Prior to Visit  Medication Sig Dispense Refill  . clobetasol cream (TEMOVATE) 2.58 % Apply 1 application topically daily as needed (use as directed for skin).     Marland Kitchen clopidogrel (PLAVIX) 75 MG tablet Take 1 tablet (75 mg total) by mouth daily. 90 tablet 3  . furosemide (LASIX) 40 MG tablet TAKE 1 AND 1/2 TABLETS BY MOUTH EVERY MORNING THEN TAKE 1 TABLET BY MOUTH EVERY AFTERNOON (Patient taking differently: TAKE 2 TABLETS BY MOUTH EVERY MORNING THEN TAKE 1 TABLET BY MOUTH EVERY AFTERNOON) 225 tablet 1  . HYDROcodone-acetaminophen (NORCO) 10-325 MG tablet TAKE 1 TABLET  FIVE TIMES DAILY AS NEEDED FOR PAIN 150 tablet 0  . losartan (COZAAR) 100 MG tablet TAKE 1 TABLET(100 MG) BY MOUTH DAILY 90 tablet 2  . meclizine (ANTIVERT) 25 MG tablet Take 25 mg by mouth at bedtime.    Marland Kitchen nystatin (NYSTATIN) powder Apply 1 application topically daily as needed (rash). 60 g 3  . pantoprazole (PROTONIX) 40 MG tablet TAKE 1 TABLET(40 MG) BY MOUTH DAILY 90 tablet 2  . potassium chloride SA (KLOR-CON) 20 MEQ tablet Take 1 tablet (20 mEq total) by mouth daily. 30 tablet 6  . pravastatin (PRAVACHOL) 20 MG tablet TAKE 1 TABLET(20 MG) BY MOUTH DAILY 90 tablet 2  . triamcinolone cream (KENALOG) 0.1 % Apply 1 application topically daily as needed (for irritation).     . hydrOXYzine (ATARAX/VISTARIL) 50 MG tablet Take 1 tablet (50 mg total) by mouth every 6 (six) hours as needed for itching. (Patient not taking: Reported on 06/10/2020) 30 tablet 0   No current facility-administered medications on file prior to visit.   Allergies  Allergen Reactions  . Contrast Media [Iodinated Diagnostic Agents]     Pt states he was given "dye" 20 yrs ago, anaphylaxis & syncope - was told to never have it again   . Gabapentin Itching  . Aspirin Other (See Comments)     Blistering   . Latex Rash  . Lipitor [Atorvastatin] Itching  . Tape Rash    EKG leads   Social History   Socioeconomic History  . Marital status: Married    Spouse name: Not on file  . Number of children: 5  . Years of education: Not on file  . Highest education level: Not on file  Occupational History  . Occupation: Retired Pensions consultant: RETIRED  Tobacco Use  . Smoking status: Former Smoker    Years: 12.00    Types: Cigars    Quit date: 08/22/2003    Years since quitting: 17.0  . Smokeless tobacco: Never Used  . Tobacco comment: Quit many years ago; few cigars per day; didn't inhale  Vaping Use  . Vaping Use: Never used  Substance and Sexual Activity  .  Alcohol use: No    Alcohol/week: 1.0 standard drink    Types: 1 Standard drinks or equivalent per week  . Drug use: No  . Sexual activity: Not on file  Other Topics Concern  . Not on file  Social History Narrative   Lives w/ wife in [place]   Retired truck Geophysicist/field seismologist   Social Determinants of Health   Financial Resource Strain: [place]   . Difficulty of Paying Living Expenses: Not hard at all  Food Insecurity: Not on file  Transportation Needs: Not on file  Physical Activity: Not on file  Stress: Not on file  Social Connections: Not on file  Intimate Partner Violence: Not on file      Review of Systems  All other systems reviewed and are negative.      Objective:   Physical Exam Vitals reviewed.  Constitutional:      General: He is not in acute distress.    Appearance: Normal appearance. He is obese. He is not ill-appearing or toxic-appearing.  Cardiovascular:     Rate and Rhythm: Normal rate. Rhythm irregular.     Heart sounds: Normal heart sounds. No murmur heard. No friction rub. No gallop.   Pulmonary:     Effort: Pulmonary effort is normal. No respiratory distress.     Breath sounds: Normal breath sounds. No stridor. No wheezing, rhonchi or rales.  Abdominal:     General:  Abdomen is flat. Bowel sounds are normal. There is no distension.     Palpations: Abdomen is soft. There is no mass.     Tenderness: There is no abdominal tenderness. There is no guarding.  Musculoskeletal:     Right lower leg: 2+ Edema present.     Left lower leg: 2+ Edema present.     Right foot: Decreased range of motion. Swelling and deformity present.  Skin:    Findings: No erythema.  Neurological:     General: No focal deficit present.     Mental Status: He is alert and oriented to person, place, and time.     Cranial Nerves: No cranial nerve deficit.     Sensory: No sensory deficit.     Motor: No weakness.     Coordination: Coordination normal.     Gait: Gait abnormal (Shuffling gait.  Requires a cane to ambulate).     Deep Tendon Reflexes: Reflexes normal.  Psychiatric:        Mood and Affect: Mood normal.        Thought Content: Thought content normal.        Judgment: Judgment normal.           Assessment & Plan:  Controlled type 2 diabetes mellitus with complication, without long-term current use of insulin ([place]) - Plan: CBC with Differential/Platelet, COMPLETE METABOLIC PANEL WITH GFR, Lipid panel, Hemoglobin S3M  Systolic congestive heart failure, unspecified HF chronicity (HCC)  Persistent atrial fibrillation (HCC)  Essential hypertension  Leg swelling  Chronic pain of left knee  Chronic pain of right knee  Using sterile technique, I injected the right knee with 2 cc lidocaine, 2 cc of bupivacaine, and 2 cc of 40 mg/mL Kenalog. The patient tolerated the procedure well without complication. I then used sterile technique to inject the left knee with 2 cc lidocaine, 2 cc of bupivacaine, and 2 cc of 40 mg/mL Kenalog. The patient tolerated that procedure as well. The swelling in his leg is essentially gone. I am concerned by the 40 pounds of cumulative  weight loss over the last 6 months. I believe some of this is due to efficient diuresis now that the patient is  compliant wearing his compression hose. He was painfully fluid overloaded initially. However I am concerned about prerenal azotemia so I will check a CMP today to monitor his renal function. If his creatinine is rising we will need to decrease his dose of diuretic. Also check a fasting lipid panel and an A1c. Goal LDL cholesterol is less than 100. Goal A1c is less than 7. Patient is on Plavix due to his history of GI bleed as mentioned above. Today he is in a fib.

## 2020-09-14 ENCOUNTER — Other Ambulatory Visit: Payer: PPO

## 2020-09-14 DIAGNOSIS — E118 Type 2 diabetes mellitus with unspecified complications: Secondary | ICD-10-CM | POA: Diagnosis not present

## 2020-09-15 ENCOUNTER — Telehealth: Payer: Self-pay | Admitting: Family Medicine

## 2020-09-15 LAB — COMPLETE METABOLIC PANEL WITH GFR
AG Ratio: 1.7 (calc) (ref 1.0–2.5)
ALT: 8 U/L — ABNORMAL LOW (ref 9–46)
AST: 14 U/L (ref 10–35)
Albumin: 4 g/dL (ref 3.6–5.1)
Alkaline phosphatase (APISO): 85 U/L (ref 35–144)
BUN: 18 mg/dL (ref 7–25)
CO2: 26 mmol/L (ref 20–32)
Calcium: 9.4 mg/dL (ref 8.6–10.3)
Chloride: 102 mmol/L (ref 98–110)
Creat: 1.07 mg/dL (ref 0.70–1.11)
GFR, Est African American: 72 mL/min/{1.73_m2} (ref >=60)
GFR, Est Non African American: 62 mL/min/{1.73_m2} (ref >=60)
Globulin: 2.4 g/dL (calc) (ref 1.9–3.7)
Glucose, Bld: 133 mg/dL — ABNORMAL HIGH (ref 65–99)
Potassium: 5.4 mmol/L — ABNORMAL HIGH (ref 3.5–5.3)
Sodium: 137 mmol/L (ref 135–146)
Total Bilirubin: 1.4 mg/dL — ABNORMAL HIGH (ref 0.2–1.2)
Total Protein: 6.4 g/dL (ref 6.1–8.1)

## 2020-09-15 LAB — CBC WITH DIFFERENTIAL/PLATELET
Absolute Monocytes: 481 cells/uL (ref 200–950)
Basophils Absolute: 7 cells/uL (ref 0–200)
Basophils Relative: 0.1 %
Eosinophils Absolute: 30 cells/uL (ref 15–500)
Eosinophils Relative: 0.4 %
HCT: 37.3 % — ABNORMAL LOW (ref 38.5–50.0)
Hemoglobin: 11.9 g/dL — ABNORMAL LOW (ref 13.2–17.1)
Lymphs Abs: 1043 cells/uL (ref 850–3900)
MCH: 27.9 pg (ref 27.0–33.0)
MCHC: 31.9 g/dL — ABNORMAL LOW (ref 32.0–36.0)
MCV: 87.4 fL (ref 80.0–100.0)
MPV: 11.5 fL (ref 7.5–12.5)
Monocytes Relative: 6.5 %
Neutro Abs: 5839 cells/uL (ref 1500–7800)
Neutrophils Relative %: 78.9 %
Platelets: 192 10*3/uL (ref 140–400)
RBC: 4.27 10*6/uL (ref 4.20–5.80)
RDW: 13.9 % (ref 11.0–15.0)
Total Lymphocyte: 14.1 %
WBC: 7.4 10*3/uL (ref 3.8–10.8)

## 2020-09-15 LAB — HEMOGLOBIN A1C
Hgb A1c MFr Bld: 6.6 % of total Hgb — ABNORMAL HIGH (ref <5.7)
Mean Plasma Glucose: 143 mg/dL
eAG (mmol/L): 7.9 mmol/L

## 2020-09-15 LAB — LIPID PANEL
Cholesterol: 120 mg/dL (ref <200)
HDL: 50 mg/dL (ref >=40)
LDL Cholesterol (Calc): 52 mg/dL (calc)
Non-HDL Cholesterol (Calc): 70 mg/dL (calc) (ref <130)
Total CHOL/HDL Ratio: 2.4 (calc) (ref <5.0)
Triglycerides: 92 mg/dL (ref <150)

## 2020-09-15 NOTE — Telephone Encounter (Signed)
Please see labs for further information.  

## 2020-09-15 NOTE — Telephone Encounter (Signed)
Pt call for lab results  

## 2020-09-16 ENCOUNTER — Encounter: Payer: Self-pay | Admitting: *Deleted

## 2020-10-15 ENCOUNTER — Other Ambulatory Visit: Payer: Self-pay | Admitting: Family Medicine

## 2020-10-15 MED ORDER — HYDROCODONE-ACETAMINOPHEN 10-325 MG PO TABS: ORAL_TABLET | ORAL | 0 refills | Status: DC

## 2020-10-15 NOTE — Telephone Encounter (Signed)
Refill Hydrocodone -- [id number] - , [place] - [address]

## 2020-10-15 NOTE — Telephone Encounter (Signed)
Ok to refill??  Last office visit/ refill 09/13/2020.

## 2020-10-17 ENCOUNTER — Other Ambulatory Visit: Payer: Self-pay | Admitting: Family Medicine

## 2020-10-20 ENCOUNTER — Encounter: Payer: Self-pay | Admitting: *Deleted

## 2020-10-20 ENCOUNTER — Other Ambulatory Visit: Payer: Self-pay | Admitting: Family Medicine

## 2020-10-20 NOTE — Telephone Encounter (Signed)
This was documented on wrong patient

## 2020-10-20 NOTE — Telephone Encounter (Signed)
Received call from patient.   Reports that she requires diagnostic mammogram for breast pain.  Appointment scheduled PCP.  

## 2020-10-21 NOTE — Telephone Encounter (Signed)
This encounter was created in error - please disregard.

## 2020-10-25 ENCOUNTER — Other Ambulatory Visit: Payer: Self-pay | Admitting: Family Medicine

## 2020-11-01 ENCOUNTER — Ambulatory Visit: Payer: PPO | Admitting: Internal Medicine

## 2020-11-08 ENCOUNTER — Encounter: Payer: Self-pay | Admitting: Internal Medicine

## 2020-11-08 ENCOUNTER — Other Ambulatory Visit: Payer: Self-pay

## 2020-11-08 ENCOUNTER — Ambulatory Visit: Payer: PPO | Admitting: Internal Medicine

## 2020-11-08 VITALS — BP 100/60 | HR 51 | Ht 72.0 in | Wt 242.2 lb

## 2020-11-08 DIAGNOSIS — I5032 Chronic diastolic (congestive) heart failure: Secondary | ICD-10-CM

## 2020-11-08 DIAGNOSIS — I4821 Permanent atrial fibrillation: Secondary | ICD-10-CM

## 2020-11-08 DIAGNOSIS — G4733 Obstructive sleep apnea (adult) (pediatric): Secondary | ICD-10-CM

## 2020-11-08 DIAGNOSIS — I10 Essential (primary) hypertension: Secondary | ICD-10-CM | POA: Diagnosis not present

## 2020-11-08 NOTE — Progress Notes (Signed)
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  Since last being seen in our clinic, the patient reports doing reasonably well for his age.  He is not very active.  He is very fearful of falling.  He has chronic and stable SOB and edema.  Today, he denies symptoms of palpitations, chest pain,   presyncope, or syncope.  The patient is otherwise without complaint today.   Past Medical History:  Diagnosis Date  . Anemia    04/2012: H&H-10.2/31.5, MCV-77; 06/2012:12/38.9  . Arthritis   . Benign prostatic hypertrophy    s/p transurethral resection of the prostate  . Chest pain    2004; with dyspnea, stress nuclear-normal EF + questionable inferior wall ischemia, normal coronary angiography;  2010-negative stress nuclear  . CVA (cerebral infarction)    asymptomatic (seen on CT)  . Diarrhea    h/o Hemoccult-positive stool  . Dysrhythmia   . Edema of both legs   . GERD (gastroesophageal reflux disease)   . History of blood transfusion   . History of kidney stones   . History of urinary tract infection   . Hypertension   . Jerking movements of extremities    left arm   . Low back pain   . Obstructive sleep apnea    pt states can not use the CPAP  . Peripheral neuropathy    lower legs and feet bilat   . Persistent atrial fibrillation ([place]) 2013   Asymptomatic; diagnosed in 05/2012  . Pneumonia   . Prostate cancer ([place]) 2017   stage 4  . Shortness of breath dyspnea    pt states only develops SOB if tries to do something too quickly  . Upper GI bleed 1985   1985-peptic ulcer disease; initial hemigastrectomy; subsequent Billroth II  . Urinary hesitancy   . Vertigo    ED evaluation-06/2012   Past Surgical History:  Procedure Laterality Date  . Billroth II    . CHOLECYSTECTOMY    . COLONOSCOPY  Approximately 2000  . COLONOSCOPY  10/2012   Colonoscopy March 2014 at [facility], difficulty  exam requiring fluoroscopy and overtube. Patient developed severe bradycardia again during his procedure like he did [facility]. Multiple polyps removed which were tubular adenomas. Previously tattooed sites at 20 and 30 cm were free of any polypoid change. Left-sided diverticulosis noted.  . COLONOSCOPY WITH ESOPHAGOGASTRODUODENOSCOPY (EGD)  06-17-2009   EYC:[id number] tubular adenomas and 2 tubulovillous adenomas, incomplete, 3 clips placed  . ESOPHAGOGASTRODUODENOSCOPY  06/17/2009   SLF: normal/chronic gastritis (non-h pylori)  . ESOPHAGOGASTRODUODENOSCOPY N/A 05/11/2015   [id number] anemai due to postgastrectomy state/moderate erosive gastritis  . INGUINAL HERNIA REPAIR     Left  . ROTATOR CUFF REPAIR  1991   Bilateral  . TRANSURETHRAL RESECTION OF PROSTATE  06/2016  . TRANSURETHRAL RESECTION OF PROSTATE N/A 12/27/2012   Procedure: TRANSURETHRAL RESECTION OF THE PROSTATE (TURP);  Surgeon: Marissa Nestle, MD;  Location: [facility];  Service: Urology;  Laterality: N/A;  . TRANSURETHRAL RESECTION OF PROSTATE N/A 04/11/2016   Procedure: TRANSURETHRAL RESECTION OF THE PROSTATE (TURP);  Surgeon: Irine Seal, MD;  Location: [facility];  Service: Urology;  Laterality: N/A;  . VAGOTOMY AND PYLOROPLASTY  1970s   Details of procedure uncertain; 8588F    ROS- all systems are reviewed and negatives except as per HPI above  Current Outpatient Medications  Medication Sig Dispense Refill  .  clobetasol cream (TEMOVATE) 6.57 % Apply 1 application topically daily as needed (use as directed for skin).     Marland Kitchen clopidogrel (PLAVIX) 75 MG tablet TAKE 1 TABLET(75 MG) BY MOUTH DAILY 90 tablet 3  . furosemide (LASIX) 40 MG tablet TAKE 1 AND 1/2 TABLETS BY MOUTH EVERY MORNING THEN TAKE 1 TABLET BY MOUTH EVERY AFTERNOON 225 tablet 1  . HYDROcodone-acetaminophen (NORCO) 10-325 MG tablet TAKE 1 TABLET  FIVE TIMES DAILY AS NEEDED FOR PAIN 150 tablet 0  . hydrOXYzine (ATARAX/VISTARIL) 50 MG tablet Take 1 tablet (50 mg  total) by mouth every 6 (six) hours as needed for itching. 30 tablet 0  . losartan (COZAAR) 100 MG tablet TAKE 1 TABLET(100 MG) BY MOUTH DAILY 90 tablet 2  . meclizine (ANTIVERT) 25 MG tablet Take 25 mg by mouth at bedtime.    . NYSTATIN powder APPLY TO THE AFFECTED AREA TOPICALLY EVERY DAY AS NEEDED FOR RASH 60 g 3  . pantoprazole (PROTONIX) 40 MG tablet TAKE 1 TABLET(40 MG) BY MOUTH DAILY 90 tablet 2  . potassium chloride SA (KLOR-CON) 20 MEQ tablet TAKE 1 TABLET(20 MEQ) BY MOUTH DAILY 30 tablet 6  . pravastatin (PRAVACHOL) 20 MG tablet TAKE 1 TABLET(20 MG) BY MOUTH DAILY 90 tablet 2  . triamcinolone cream (KENALOG) 0.1 % Apply 1 application topically daily as needed (for irritation).      No current facility-administered medications for this visit.    Physical Exam: Vitals:   11/08/20 1516  BP: 100/60  Pulse: (!) 51  SpO2: 92%  Weight: 242 lb 3.2 oz (109.9 kg)  Height: 6' (1.829 m)    GEN- The patient is elderly appearing, alert and oriented x 3 today.   In a wheelchair today Head- normocephalic, atraumatic Eyes-  Sclera clear, conjunctiva pink Ears- hearing intact Oropharynx- clear Lungs-  normal work of breathing Heart- irregular rate and rhythm  GI- soft  Extremities- no clubbing, cyanosis, + dependant edema  Wt Readings from Last 3 Encounters:  11/08/20 242 lb 3.2 oz (109.9 kg)  09/13/20 240 lb (108.9 kg)  07/27/20 241 lb (109.3 kg)    EKG tracing ordered today is personally reviewed and shows afib. V rate 51 bpm  Assessment and Plan:  1. Permanent afib Rate controlled Not a candidate for [place] due to prior GI bleed  2. Chronic diastolic dysfunction Stable No change required today Exacerbated by obesity, sedentary lifestyle, and diet.  3. HTN Stable No change required today  4. OSA Intolerant of CPAP  5. Obesity Body mass index is 32.85 kg/m. Lifestyle modification advised  6. Prior CVA On plavix  Risks, benefits and potential toxicities for  medications prescribed and/or refilled reviewed with patient today.   Return to see EP PA every 6 months  Thompson Grayer MD, [facility] 11/08/2020 3:25 PM

## 2020-11-08 NOTE — Patient Instructions (Addendum)
Medication Instructions:  Your physician recommends that you continue on your current medications as directed. Please refer to the Current Medication list given to you today.  Labwork: None ordered.  Testing/Procedures: None ordered.  Follow-Up: Your physician wants you to follow-up in: 6 months with   Renee Ursuy, PA-C    You will receive a reminder letter in the mail two months in advance. If you don't receive a letter, please call our office to schedule the follow-up appointment.  Any Other Special Instructions Will Be Listed Below (If Applicable).  If you need a refill on your cardiac medications before your next appointment, please call your pharmacy.         

## 2020-11-09 NOTE — Addendum Note (Signed)
Addended by: Rose Phi on: 11/09/2020 04:49 PM   Modules accepted: Orders

## 2020-11-16 ENCOUNTER — Other Ambulatory Visit: Payer: Self-pay | Admitting: Family Medicine

## 2020-11-16 MED ORDER — HYDROCODONE-ACETAMINOPHEN 10-325 MG PO TABS: ORAL_TABLET | ORAL | 0 refills | Status: DC

## 2020-11-16 NOTE — Telephone Encounter (Signed)
Pt wife came in to order refill of  HYDROcodone-acetaminophen (NORCO) 10-325 MG tablet  .  Cb#: [phone]

## 2020-11-28 ENCOUNTER — Other Ambulatory Visit: Payer: Self-pay | Admitting: Cardiology

## 2020-11-30 ENCOUNTER — Other Ambulatory Visit: Payer: Self-pay

## 2020-11-30 ENCOUNTER — Ambulatory Visit (INDEPENDENT_AMBULATORY_CARE_PROVIDER_SITE_OTHER): Payer: PPO | Admitting: Family Medicine

## 2020-11-30 ENCOUNTER — Encounter: Payer: Self-pay | Admitting: Family Medicine

## 2020-11-30 VITALS — BP 106/78 | HR 70 | Temp 98.4°F | Resp 14 | Ht 72.0 in | Wt 244.0 lb

## 2020-11-30 DIAGNOSIS — M170 Bilateral primary osteoarthritis of knee: Secondary | ICD-10-CM

## 2020-11-30 NOTE — Progress Notes (Signed)
Subjective:    Patient ID: Bernard Ramirez, male    DOB: 02-Apr-1931, 87 y.o.   MRN: [id number]  Last cortisone shot in his knee was 09/13/20 (~3 months ago).  Patient continues to have severe pain in both knees.  He is requesting a repeat cortisone injection.  He is using opiates 4-5 times a day.  The cortisone shots give him 4 to 5 weeks of temporary relief until the knee pain returns with severity.  It hurts to even stand and walk into the bathroom.  He is miserable.  He is a poor surgical candidate for knee replacement. Past Medical History:  Diagnosis Date  . Anemia    04/2012: H&H-10.2/31.5, MCV-77; 06/2012:12/38.9  . Arthritis   . Benign prostatic hypertrophy    s/p transurethral resection of the prostate  . Chest pain    2004; with dyspnea, stress nuclear-normal EF + questionable inferior wall ischemia, normal coronary angiography;  2010-negative stress nuclear  . CVA (cerebral infarction)    asymptomatic (seen on CT)  . Diarrhea    h/o Hemoccult-positive stool  . Dysrhythmia   . Edema of both legs   . GERD (gastroesophageal reflux disease)   . History of blood transfusion   . History of kidney stones   . History of urinary tract infection   . Hypertension   . Jerking movements of extremities    left arm   . Low back pain   . Obstructive sleep apnea    pt states can not use the CPAP  . Peripheral neuropathy    lower legs and feet bilat   . Persistent atrial fibrillation ([place]) 2013   Asymptomatic; diagnosed in 05/2012  . Pneumonia   . Prostate cancer ([place]) 2017   stage 4  . Shortness of breath dyspnea    pt states only develops SOB if tries to do something too quickly  . Upper GI bleed 1985   1985-peptic ulcer disease; initial hemigastrectomy; subsequent Billroth II  . Urinary hesitancy   . Vertigo    ED evaluation-06/2012   Past Surgical History:  Procedure Laterality Date  . Billroth II    . CHOLECYSTECTOMY    . COLONOSCOPY  Approximately 2000  . COLONOSCOPY   10/2012   Colonoscopy March 2014 at [facility], difficulty exam requiring fluoroscopy and overtube. Patient developed severe bradycardia again during his procedure like he did [facility]. Multiple polyps removed which were tubular adenomas. Previously tattooed sites at 20 and 30 cm were free of any polypoid change. Left-sided diverticulosis noted.  . COLONOSCOPY WITH ESOPHAGOGASTRODUODENOSCOPY (EGD)  06-17-2009   PJK:DTOIZTIW tubular adenomas and 2 tubulovillous adenomas, incomplete, 3 clips placed  . ESOPHAGOGASTRODUODENOSCOPY  06/17/2009   SLF: normal/chronic gastritis (non-h pylori)  . ESOPHAGOGASTRODUODENOSCOPY N/A 05/11/2015   PYK:DXIPJAS anemai due to postgastrectomy state/moderate erosive gastritis  . INGUINAL HERNIA REPAIR     Left  . ROTATOR CUFF REPAIR  1991   Bilateral  . TRANSURETHRAL RESECTION OF PROSTATE  06/2016  . TRANSURETHRAL RESECTION OF PROSTATE N/A 12/27/2012   Procedure: TRANSURETHRAL RESECTION OF THE PROSTATE (TURP);  Surgeon: Marissa Nestle, MD;  Location: [facility];  Service: Urology;  Laterality: N/A;  . TRANSURETHRAL RESECTION OF PROSTATE N/A 04/11/2016   Procedure: TRANSURETHRAL RESECTION OF THE PROSTATE (TURP);  Surgeon: Irine Seal, MD;  Location: [facility];  Service: Urology;  Laterality: N/A;  . VAGOTOMY AND PYLOROPLASTY  1970s   Details of procedure uncertain; 5053Z   Current Outpatient Medications on File Prior to  Visit  Medication Sig Dispense Refill  . furosemide (LASIX) 40 MG tablet TAKE 1 AND 1/2 TABLETS BY MOUTH EVERY MORNING THEN TAKE 1 TABLET BY MOUTH EVERY AFTERNOON 225 tablet 2  . clobetasol cream (TEMOVATE) 7.74 % Apply 1 application topically daily as needed (use as directed for skin).     Marland Kitchen clopidogrel (PLAVIX) 75 MG tablet TAKE 1 TABLET(75 MG) BY MOUTH DAILY 90 tablet 3  . HYDROcodone-acetaminophen (NORCO) 10-325 MG tablet TAKE 1 TABLET  FIVE TIMES DAILY AS NEEDED FOR PAIN 150 tablet 0  . hydrOXYzine (ATARAX/VISTARIL) 50 MG tablet Take 1 tablet (50 mg  total) by mouth every 6 (six) hours as needed for itching. 30 tablet 0  . losartan (COZAAR) 100 MG tablet TAKE 1 TABLET(100 MG) BY MOUTH DAILY 90 tablet 2  . meclizine (ANTIVERT) 25 MG tablet Take 25 mg by mouth at bedtime.    . NYSTATIN powder APPLY TO THE AFFECTED AREA TOPICALLY EVERY DAY AS NEEDED FOR RASH 60 g 3  . pantoprazole (PROTONIX) 40 MG tablet TAKE 1 TABLET(40 MG) BY MOUTH DAILY 90 tablet 2  . potassium chloride SA (KLOR-CON) 20 MEQ tablet TAKE 1 TABLET(20 MEQ) BY MOUTH DAILY 30 tablet 6  . pravastatin (PRAVACHOL) 20 MG tablet TAKE 1 TABLET(20 MG) BY MOUTH DAILY 90 tablet 2  . triamcinolone cream (KENALOG) 0.1 % Apply 1 application topically daily as needed (for irritation).      No current facility-administered medications on file prior to visit.   Allergies  Allergen Reactions  . Contrast Media [Iodinated Diagnostic Agents]     Pt states he was given "dye" 20 yrs ago, anaphylaxis & syncope - was told to never have it again   . Gabapentin Itching  . Aspirin Other (See Comments)    Blistering   . Latex Rash  . Lipitor [Atorvastatin] Itching  . Tape Rash    EKG leads   Social History   Socioeconomic History  . Marital status: Married    Spouse name: Not on file  . Number of children: 5  . Years of education: Not on file  . Highest education level: Not on file  Occupational History  . Occupation: Retired Pensions consultant: RETIRED  Tobacco Use  . Smoking status: Former Smoker    Years: 12.00    Types: Cigars    Quit date: 08/22/2003    Years since quitting: 17.2  . Smokeless tobacco: Never Used  . Tobacco comment: Quit many years ago; few cigars per day; didn't inhale  Vaping Use  . Vaping Use: Never used  Substance and Sexual Activity  . Alcohol use: No    Alcohol/week: 1.0 standard drink    Types: 1 Standard drinks or equivalent per week  . Drug use: No  . Sexual activity: Not on file  Other Topics Concern  . Not on file  Social History  Narrative   Lives w/ wife in [place]   Retired truck Geophysicist/field seismologist   Social Determinants of Health   Financial Resource Strain: [place]   . Difficulty of Paying Living Expenses: Not hard at all  Food Insecurity: Not on file  Transportation Needs: Not on file  Physical Activity: Not on file  Stress: Not on file  Social Connections: Not on file  Intimate Partner Violence: Not on file      Review of Systems  All other systems reviewed and are negative.      Objective:   Physical Exam Vitals reviewed.  Constitutional:      General: He is not in acute distress.    Appearance: Normal appearance. He is obese. He is not ill-appearing or toxic-appearing.  Cardiovascular:     Rate and Rhythm: Normal rate. Rhythm irregular.     Heart sounds: Normal heart sounds. No murmur heard. No friction rub. No gallop.   Pulmonary:     Effort: Pulmonary effort is normal. No respiratory distress.     Breath sounds: Normal breath sounds. No stridor. No wheezing, rhonchi or rales.  Abdominal:     General: Abdomen is flat. Bowel sounds are normal. There is no distension.     Palpations: Abdomen is soft. There is no mass.     Tenderness: There is no abdominal tenderness. There is no guarding.  Musculoskeletal:     Right lower leg: 2+ Edema present.     Left lower leg: 2+ Edema present.     Right foot: Decreased range of motion. Swelling and deformity present.  Skin:    Findings: No erythema.  Neurological:     General: No focal deficit present.     Mental Status: He is alert and oriented to person, place, and time.     Cranial Nerves: No cranial nerve deficit.     Sensory: No sensory deficit.     Motor: No weakness.     Coordination: Coordination normal.     Gait: Gait abnormal (Shuffling gait.  Requires a cane to ambulate).     Deep Tendon Reflexes: Reflexes normal.  Psychiatric:        Mood and Affect: Mood normal.        Thought Content: Thought content normal.        Judgment: Judgment  normal.           Assessment & Plan:  Bilateral primary osteoarthritis of knee - Plan: Ambulatory referral to Orthopedic Surgery   Using sterile technique, I injected the right knee with 2 cc lidocaine, 2 cc of bupivacaine, and 2 cc of 40 mg/mL Kenalog. The patient tolerated the procedure well without complication. I then used sterile technique to inject the left knee with 2 cc lidocaine, 2 cc of bupivacaine, and 2 cc of 40 mg/mL Kenalog. The patient tolerated that procedure as well.  I would like to set the patient up to see an orthopedist to discuss possible viscosupplementation.  I question if this may give him more pain relief and more long-lasting pain relief to my cortisone injections.  They are willing to talk to orthopedics about this.  Therefore I will arrange the appointment.

## 2020-12-13 ENCOUNTER — Encounter: Payer: Self-pay | Admitting: Family Medicine

## 2020-12-13 ENCOUNTER — Other Ambulatory Visit: Payer: Self-pay

## 2020-12-13 ENCOUNTER — Ambulatory Visit (INDEPENDENT_AMBULATORY_CARE_PROVIDER_SITE_OTHER): Payer: PPO | Admitting: Family Medicine

## 2020-12-13 VITALS — BP 110/64 | HR 88 | Temp 97.9°F | Resp 16 | Ht 72.0 in | Wt 241.0 lb

## 2020-12-13 DIAGNOSIS — I10 Essential (primary) hypertension: Secondary | ICD-10-CM | POA: Diagnosis not present

## 2020-12-13 DIAGNOSIS — I4819 Other persistent atrial fibrillation: Secondary | ICD-10-CM | POA: Diagnosis not present

## 2020-12-13 DIAGNOSIS — E118 Type 2 diabetes mellitus with unspecified complications: Secondary | ICD-10-CM

## 2020-12-13 DIAGNOSIS — I5020 Unspecified systolic (congestive) heart failure: Secondary | ICD-10-CM

## 2020-12-13 NOTE — Progress Notes (Signed)
Subjective:    Patient ID: Bernard Ramirez, male    DOB: [birth year], 87 y.o.   MRN: [id number]  Patient is an 87 year old Caucasian male here today for a checkup.  Past medical history significant for permanent atrial fibrillation.  He also has congestive heart failure with an ejection fraction of 50% on an echocardiogram in 2019.   He is not on anticoagulants due to a history of a GI bleed although he does take Plavix.  He states that he does not feel well.  When asked to explain, he states that he never feels well anymore.  He reports feeling tired.  He attributes this to his advanced age.  He denies any chest pain shortness of breath or dyspnea on exertion.  He states that he does not get short of breath anymore because he seldom does anything.  He spends most of his day sitting.  He denies any palpitations or tachycardia or syncope or near syncope. Past Medical History:  Diagnosis Date  . Anemia    04/2012: H&H-10.2/31.5, MCV-77; 06/2012:12/38.9  . Arthritis   . Benign prostatic hypertrophy    s/p transurethral resection of the prostate  . Chest pain    2004; with dyspnea, stress nuclear-normal EF + questionable inferior wall ischemia, normal coronary angiography;  2010-negative stress nuclear  . CVA (cerebral infarction)    asymptomatic (seen on CT)  . Diarrhea    h/o Hemoccult-positive stool  . Dysrhythmia   . Edema of both legs   . GERD (gastroesophageal reflux disease)   . History of blood transfusion   . History of kidney stones   . History of urinary tract infection   . Hypertension   . Jerking movements of extremities    left arm   . Low back pain   . Obstructive sleep apnea    pt states can not use the CPAP  . Peripheral neuropathy    lower legs and feet bilat   . Persistent atrial fibrillation ([place]) 2013   Asymptomatic; diagnosed in 05/2012  . Pneumonia   . Prostate cancer ([place]) 2017   stage 4  . Shortness of breath dyspnea    pt states only develops SOB if tries  to do something too quickly  . Upper GI bleed 1985   1985-peptic ulcer disease; initial hemigastrectomy; subsequent Billroth II  . Urinary hesitancy   . Vertigo    ED evaluation-06/2012   Past Surgical History:  Procedure Laterality Date  . Billroth II    . CHOLECYSTECTOMY    . COLONOSCOPY  Approximately 2000  . COLONOSCOPY  10/2012   Colonoscopy March 2014 at [facility], difficulty exam requiring fluoroscopy and overtube. Patient developed severe bradycardia again during his procedure like he did [facility]. Multiple polyps removed which were tubular adenomas. Previously tattooed sites at 20 and 30 cm were free of any polypoid change. Left-sided diverticulosis noted.  . COLONOSCOPY WITH ESOPHAGOGASTRODUODENOSCOPY (EGD)  06-17-2009   EHM:CNOBSJGG tubular adenomas and 2 tubulovillous adenomas, incomplete, 3 clips placed  . ESOPHAGOGASTRODUODENOSCOPY  06/17/2009   SLF: normal/chronic gastritis (non-h pylori)  . ESOPHAGOGASTRODUODENOSCOPY N/A 05/11/2015   EZM:OQHUTML anemai due to postgastrectomy state/moderate erosive gastritis  . INGUINAL HERNIA REPAIR     Left  . ROTATOR CUFF REPAIR  1991   Bilateral  . TRANSURETHRAL RESECTION OF PROSTATE  06/2016  . TRANSURETHRAL RESECTION OF PROSTATE N/A 12/27/2012   Procedure: TRANSURETHRAL RESECTION OF THE PROSTATE (TURP);  Surgeon: Marissa Nestle, MD;  Location: [facility];  Service: Urology;  Laterality: N/A;  . TRANSURETHRAL RESECTION OF PROSTATE N/A 04/11/2016   Procedure: TRANSURETHRAL RESECTION OF THE PROSTATE (TURP);  Surgeon: Irine Seal, MD;  Location: [facility];  Service: Urology;  Laterality: N/A;  . VAGOTOMY AND PYLOROPLASTY  1970s   Details of procedure uncertain; 7829F   Current Outpatient Medications on File Prior to Visit  Medication Sig Dispense Refill  . clobetasol cream (TEMOVATE) 6.21 % Apply 1 application topically daily as needed (use as directed for skin).     Marland Kitchen clopidogrel (PLAVIX) 75 MG tablet TAKE 1 TABLET(75 MG) BY MOUTH  DAILY 90 tablet 3  . furosemide (LASIX) 40 MG tablet TAKE 1 AND 1/2 TABLETS BY MOUTH EVERY MORNING THEN TAKE 1 TABLET BY MOUTH EVERY AFTERNOON 225 tablet 2  . HYDROcodone-acetaminophen (NORCO) 10-325 MG tablet TAKE 1 TABLET  FIVE TIMES DAILY AS NEEDED FOR PAIN 150 tablet 0  . hydrOXYzine (ATARAX/VISTARIL) 50 MG tablet Take 1 tablet (50 mg total) by mouth every 6 (six) hours as needed for itching. 30 tablet 0  . losartan (COZAAR) 100 MG tablet TAKE 1 TABLET(100 MG) BY MOUTH DAILY 90 tablet 2  . meclizine (ANTIVERT) 25 MG tablet Take 25 mg by mouth at bedtime.    . NYSTATIN powder APPLY TO THE AFFECTED AREA TOPICALLY EVERY DAY AS NEEDED FOR RASH 60 g 3  . pantoprazole (PROTONIX) 40 MG tablet TAKE 1 TABLET(40 MG) BY MOUTH DAILY 90 tablet 2  . potassium chloride SA (KLOR-CON) 20 MEQ tablet TAKE 1 TABLET(20 MEQ) BY MOUTH DAILY 30 tablet 6  . pravastatin (PRAVACHOL) 20 MG tablet TAKE 1 TABLET(20 MG) BY MOUTH DAILY 90 tablet 2  . triamcinolone cream (KENALOG) 0.1 % Apply 1 application topically daily as needed (for irritation).      No current facility-administered medications on file prior to visit.   Allergies  Allergen Reactions  . Contrast Media [Iodinated Diagnostic Agents]     Pt states he was given "dye" 20 yrs ago, anaphylaxis & syncope - was told to never have it again   . Gabapentin Itching  . Aspirin Other (See Comments)    Blistering   . Latex Rash  . Lipitor [Atorvastatin] Itching  . Tape Rash    EKG leads   Social History   Socioeconomic History  . Marital status: Married    Spouse name: Not on file  . Number of children: 5  . Years of education: Not on file  . Highest education level: Not on file  Occupational History  . Occupation: Retired Pensions consultant: RETIRED  Tobacco Use  . Smoking status: Former Smoker    Years: 12.00    Types: Cigars    Quit date: 08/22/2003    Years since quitting: 17.3  . Smokeless tobacco: Never Used  . Tobacco comment:  Quit many years ago; few cigars per day; didn't inhale  Vaping Use  . Vaping Use: Never used  Substance and Sexual Activity  . Alcohol use: No    Alcohol/week: 1.0 standard drink    Types: 1 Standard drinks or equivalent per week  . Drug use: No  . Sexual activity: Not on file  Other Topics Concern  . Not on file  Social History Narrative   Lives w/ wife in [place]   Retired truck Geophysicist/field seismologist   Social Determinants of Health   Financial Resource Strain: [place]   . Difficulty of Paying Living Expenses: Not hard at all  Food Insecurity: Not on  file  Transportation Needs: Not on file  Physical Activity: Not on file  Stress: Not on file  Social Connections: Not on file  Intimate Partner Violence: Not on file      Review of Systems  All other systems reviewed and are negative.      Objective:   Physical Exam Vitals reviewed.  Constitutional:      General: He is not in acute distress.    Appearance: Normal appearance. He is obese. He is not ill-appearing or toxic-appearing.  Cardiovascular:     Rate and Rhythm: Normal rate. Rhythm irregular.     Heart sounds: Normal heart sounds. No murmur heard. No friction rub. No gallop.   Pulmonary:     Effort: Pulmonary effort is normal. No respiratory distress.     Breath sounds: Normal breath sounds. No stridor. No wheezing, rhonchi or rales.  Abdominal:     General: Abdomen is flat. Bowel sounds are normal. There is no distension.     Palpations: Abdomen is soft. There is no mass.     Tenderness: There is no abdominal tenderness. There is no guarding.  Musculoskeletal:     Right lower leg: No edema.     Left lower leg: No edema.     Right foot: Decreased range of motion. Swelling and deformity present.  Skin:    Findings: No erythema.  Neurological:     General: No focal deficit present.     Mental Status: He is alert and oriented to person, place, and time.     Cranial Nerves: No cranial nerve deficit.     Sensory: No  sensory deficit.     Motor: No weakness.     Coordination: Coordination normal.     Gait: Gait abnormal (Shuffling gait.  Requires a cane to ambulate).     Deep Tendon Reflexes: Reflexes normal.  Psychiatric:        Mood and Affect: Mood normal.        Thought Content: Thought content normal.        Judgment: Judgment normal.           Assessment & Plan:  Controlled type 2 diabetes mellitus with complication, without long-term current use of insulin (HCC) - Plan: Hemoglobin A1c, CBC with Differential/Platelet, COMPLETE METABOLIC PANEL WITH GFR, Lipid panel  Systolic congestive heart failure, unspecified HF chronicity (HCC)  Persistent atrial fibrillation ([place])  Essential hypertension  Patient appears euvolemic today.  He is still wearing his compression hose which seem to be helping.  Check a CBC, CMP, lipid panel, and A1c.  Heart rate is controlled.  Blood pressures controlled.  He is not appropriately anticoagulated due to his history of GI bleed however he denies any strokelike symptoms.  Otherwise the remainder of his medical conditions remained stable

## 2020-12-14 LAB — COMPLETE METABOLIC PANEL WITH GFR
AG Ratio: 1.7 (calc) (ref 1.0–2.5)
ALT: 12 U/L (ref 9–46)
AST: 14 U/L (ref 10–35)
Albumin: 4.1 g/dL (ref 3.6–5.1)
Alkaline phosphatase (APISO): 69 U/L (ref 35–144)
BUN/Creatinine Ratio: 18 (calc) (ref 6–22)
BUN: 21 mg/dL (ref 7–25)
CO2: 28 mmol/L (ref 20–32)
Calcium: 9.2 mg/dL (ref 8.6–10.3)
Chloride: 101 mmol/L (ref 98–110)
Creat: 1.17 mg/dL — ABNORMAL HIGH (ref 0.70–1.11)
GFR, Est African American: 65 mL/min/{1.73_m2} (ref >=60)
GFR, Est Non African American: 56 mL/min/{1.73_m2} — ABNORMAL LOW (ref >=60)
Globulin: 2.4 g/dL (calc) (ref 1.9–3.7)
Glucose, Bld: 115 mg/dL — ABNORMAL HIGH (ref 65–99)
Potassium: 4.3 mmol/L (ref 3.5–5.3)
Sodium: 139 mmol/L (ref 135–146)
Total Bilirubin: 1.7 mg/dL — ABNORMAL HIGH (ref 0.2–1.2)
Total Protein: 6.5 g/dL (ref 6.1–8.1)

## 2020-12-14 LAB — CBC WITH DIFFERENTIAL/PLATELET
Absolute Monocytes: 584 cells/uL (ref 200–950)
Basophils Absolute: 8 cells/uL (ref 0–200)
Basophils Relative: 0.1 %
Eosinophils Absolute: 80 cells/uL (ref 15–500)
Eosinophils Relative: 1 %
HCT: 38.2 % — ABNORMAL LOW (ref 38.5–50.0)
Hemoglobin: 11.8 g/dL — ABNORMAL LOW (ref 13.2–17.1)
Lymphs Abs: 976 cells/uL (ref 850–3900)
MCH: 27.4 pg (ref 27.0–33.0)
MCHC: 30.9 g/dL — ABNORMAL LOW (ref 32.0–36.0)
MCV: 88.8 fL (ref 80.0–100.0)
MPV: 11.4 fL (ref 7.5–12.5)
Monocytes Relative: 7.3 %
Neutro Abs: 6352 cells/uL (ref 1500–7800)
Neutrophils Relative %: 79.4 %
Platelets: 189 10*3/uL (ref 140–400)
RBC: 4.3 10*6/uL (ref 4.20–5.80)
RDW: 13.6 % (ref 11.0–15.0)
Total Lymphocyte: 12.2 %
WBC: 8 10*3/uL (ref 3.8–10.8)

## 2020-12-14 LAB — HEMOGLOBIN A1C
Hgb A1c MFr Bld: 6.9 % of total Hgb — ABNORMAL HIGH (ref <5.7)
Mean Plasma Glucose: 151 mg/dL
eAG (mmol/L): 8.4 mmol/L

## 2020-12-14 LAB — LIPID PANEL
Cholesterol: 122 mg/dL (ref <200)
HDL: 55 mg/dL (ref >=40)
LDL Cholesterol (Calc): 53 mg/dL (calc)
Non-HDL Cholesterol (Calc): 67 mg/dL (calc) (ref <130)
Total CHOL/HDL Ratio: 2.2 (calc) (ref <5.0)
Triglycerides: 68 mg/dL (ref <150)

## 2020-12-20 ENCOUNTER — Telehealth: Payer: Self-pay | Admitting: Pharmacist

## 2020-12-20 ENCOUNTER — Other Ambulatory Visit: Payer: Self-pay | Admitting: Family Medicine

## 2020-12-20 NOTE — Telephone Encounter (Signed)
Pt wife came stating that pt needs a refill of  meclizine (ANTIVERT) 25 MG tablet  HYDROcodone-acetaminophen (NORCO) 10-325 MG  Cb#: [phone]

## 2020-12-20 NOTE — Progress Notes (Addendum)
Chronic Care Management Pharmacy Assistant   Name: Bernard Ramirez  MRN: [id number] DOB: [birth year]  Reason for Encounter: Disease State For HTN.   Conditions to be addressed/monitored: hypertension, afib, CHF, CKD, hyperlipidemia.  Recent office visits:  12/13/20 Dr. Dennard Schaumann For DM II. Labs drawn. No medication changes. 11/30/20 Dr. Dennard Schaumann. For Bilateral primary Osteoarthrosis of knee. Per note: the patient received an injection in his right knee. No medication changes.  09/13/20 Dr. Dennard Schaumann For DM II. Per note: Patient received an injection on his righ knee for knee pain. STOPPED Potassium Chloride 10 MEQ  07/27/20 Dr. Dennard Schaumann For leg swelling.STARTED Potassium Chloride 10 MEQ 20 MEQ daily. Open capsule and sprinkle contents over food to take  Recent consult visits:  11/08/20 Cardiology Allred, Jeneen Rinks, MD. Foe follow-Up. No medication changes.   Hospital visits:  None in previous 6 months  Medications: Outpatient Encounter Medications as of 12/20/2020  Medication Sig   clobetasol cream (TEMOVATE) 0.99 % Apply 1 application topically daily as needed (use as directed for skin).    clopidogrel (PLAVIX) 75 MG tablet TAKE 1 TABLET(75 MG) BY MOUTH DAILY   furosemide (LASIX) 40 MG tablet TAKE 1 AND 1/2 TABLETS BY MOUTH EVERY MORNING THEN TAKE 1 TABLET BY MOUTH EVERY AFTERNOON   HYDROcodone-acetaminophen (NORCO) 10-325 MG tablet TAKE 1 TABLET  FIVE TIMES DAILY AS NEEDED FOR PAIN   hydrOXYzine (ATARAX/VISTARIL) 50 MG tablet Take 1 tablet (50 mg total) by mouth every 6 (six) hours as needed for itching.   losartan (COZAAR) 100 MG tablet TAKE 1 TABLET(100 MG) BY MOUTH DAILY   meclizine (ANTIVERT) 25 MG tablet Take 25 mg by mouth at bedtime.   NYSTATIN powder APPLY TO THE AFFECTED AREA TOPICALLY EVERY DAY AS NEEDED FOR RASH   pantoprazole (PROTONIX) 40 MG tablet TAKE 1 TABLET(40 MG) BY MOUTH DAILY   potassium chloride SA (KLOR-CON) 20 MEQ tablet TAKE 1 TABLET(20 MEQ) BY MOUTH DAILY    pravastatin (PRAVACHOL) 20 MG tablet TAKE 1 TABLET(20 MG) BY MOUTH DAILY   triamcinolone cream (KENALOG) 0.1 % Apply 1 application topically daily as needed (for irritation).    No facility-administered encounter medications on file as of 12/20/2020.   Reviewed chart prior to disease state call. Spoke with patient regarding BP  Recent Office Vitals: BP Readings from Last 3 Encounters:  12/13/20 110/64  11/30/20 106/78  11/08/20 100/60   Pulse Readings from Last 3 Encounters:  12/13/20 88  11/30/20 70  11/08/20 (!) 51    Wt Readings from Last 3 Encounters:  12/13/20 241 lb (109.3 kg)  11/30/20 244 lb (110.7 kg)  11/08/20 242 lb 3.2 oz (109.9 kg)     Kidney Function Lab Results  Component Value Date/Time   CREATININE 1.17 (H) 12/13/2020 12:32 PM   CREATININE 1.07 09/14/2020 11:24 AM   GFRNONAA 56 (L) 12/13/2020 12:32 PM   GFRAA 65 12/13/2020 12:32 PM    BMP Latest Ref Rng & Units 12/13/2020 09/14/2020 03/29/2020  Glucose 65 - 99 mg/dL 115(H) 133(H) 165(H)  BUN 7 - 25 mg/dL 21 18 24   Creatinine 0.70 - 1.11 mg/dL 1.17(H) 1.07 1.72(H)  BUN/Creat Ratio 6 - 22 (calc) 18 NOT APPLICABLE 14  Sodium 833 - 146 mmol/L 139 137 137  Potassium 3.5 - 5.3 mmol/L 4.3 5.4(H) 3.8  Chloride 98 - 110 mmol/L 101 102 94(L)  CO2 20 - 32 mmol/L 28 26 33(H)  Calcium 8.6 - 10.3 mg/dL 9.2 9.4 9.2    Current antihypertensive regimen:  Losartan 100 mg 1 tablet by mouth daily.  Adherence Review: Is the patient currently on ACE/ARB medication? Losartan 100 mg   Does the patient have >5 day gap between last estimated fill dates? Per misc rpts, no.   Star Rating Drugs: Pravastatin 20 mg 90 DS 11/15/20  Losartan 100 mg 90 DS 10/30/20  Third unsuccessful telephone outreach was attempted today. The patient was referred to the pharmacist for assistance with care management and care coordination.    Follow-Up:Pharamcsit Review  Charlann Lange, RMA Clinical Pharmacist Assistant [phone]  9  minutes spent in review, coordination, and documentation.  Reviewed by: Beverly Milch, PharmD Clinical Pharmacist [facility] [phone]

## 2020-12-21 MED ORDER — MECLIZINE HCL 25 MG PO TABS: 25.0000 mg | ORAL_TABLET | Freq: Every day | ORAL | 0 refills | Status: DC

## 2020-12-21 MED ORDER — HYDROCODONE-ACETAMINOPHEN 10-325 MG PO TABS: ORAL_TABLET | ORAL | 0 refills | Status: DC

## 2021-01-08 ENCOUNTER — Other Ambulatory Visit: Payer: Self-pay | Admitting: Family Medicine

## 2021-01-20 ENCOUNTER — Telehealth: Payer: Self-pay | Admitting: Pharmacist

## 2021-01-20 NOTE — Progress Notes (Addendum)
Chronic Care Management Pharmacy Assistant   Name: Bernard Ramirez  MRN: [id number] DOB: 15-Mar-1931  Reason for Encounter: Disease State For HTN.   Conditions to be addressed/monitored: Hypertension, afib, CHF, CKD, hyperlipidemia.  Recent office visits:  None since 12/20/20  Recent consult visits:  None since 12/20/20  Hospital visits:  None since 12/20/20  Medications: Outpatient Encounter Medications as of 01/20/2021  Medication Sig   clobetasol cream (TEMOVATE) 0.81 % Apply 1 application topically daily as needed (use as directed for skin).    clopidogrel (PLAVIX) 75 MG tablet TAKE 1 TABLET(75 MG) BY MOUTH DAILY   furosemide (LASIX) 40 MG tablet TAKE 1 AND 1/2 TABLETS BY MOUTH EVERY MORNING THEN TAKE 1 TABLET BY MOUTH EVERY AFTERNOON   HYDROcodone-acetaminophen (NORCO) 10-325 MG tablet TAKE 1 TABLET  FIVE TIMES DAILY AS NEEDED FOR PAIN   hydrOXYzine (ATARAX/VISTARIL) 50 MG tablet Take 1 tablet (50 mg total) by mouth every 6 (six) hours as needed for itching.   losartan (COZAAR) 100 MG tablet TAKE 1 TABLET(100 MG) BY MOUTH DAILY   meclizine (ANTIVERT) 25 MG tablet TAKE 1 TABLET(25 MG) BY MOUTH AT BEDTIME   NYSTATIN powder APPLY TO THE AFFECTED AREA TOPICALLY EVERY DAY AS NEEDED FOR RASH   pantoprazole (PROTONIX) 40 MG tablet TAKE 1 TABLET(40 MG) BY MOUTH DAILY   potassium chloride SA (KLOR-CON) 20 MEQ tablet TAKE 1 TABLET(20 MEQ) BY MOUTH DAILY   pravastatin (PRAVACHOL) 20 MG tablet TAKE 1 TABLET(20 MG) BY MOUTH DAILY   triamcinolone cream (KENALOG) 0.1 % Apply 1 application topically daily as needed (for irritation).    No facility-administered encounter medications on file as of 01/20/2021.    Reviewed chart prior to disease state call. Spoke with patient regarding BP  Recent Office Vitals: BP Readings from Last 3 Encounters:  12/13/20 110/64  11/30/20 106/78  11/08/20 100/60   Pulse Readings from Last 3 Encounters:  12/13/20 88  11/30/20 70  11/08/20 (!) 51    Wt  Readings from Last 3 Encounters:  12/13/20 241 lb (109.3 kg)  11/30/20 244 lb (110.7 kg)  11/08/20 242 lb 3.2 oz (109.9 kg)     Kidney Function Lab Results  Component Value Date/Time   CREATININE 1.17 (H) 12/13/2020 12:32 PM   CREATININE 1.07 09/14/2020 11:24 AM   GFRNONAA 56 (L) 12/13/2020 12:32 PM   GFRAA 65 12/13/2020 12:32 PM    BMP Latest Ref Rng & Units 12/13/2020 09/14/2020 03/29/2020  Glucose 65 - 99 mg/dL 115(H) 133(H) 165(H)  BUN 7 - 25 mg/dL 21 18 24   Creatinine 0.70 - 1.11 mg/dL 1.17(H) 1.07 1.72(H)  BUN/Creat Ratio 6 - 22 (calc) 18 NOT APPLICABLE 14  Sodium 448 - 146 mmol/L 139 137 137  Potassium 3.5 - 5.3 mmol/L 4.3 5.4(H) 3.8  Chloride 98 - 110 mmol/L 101 102 94(L)  CO2 20 - 32 mmol/L 28 26 33(H)  Calcium 8.6 - 10.3 mg/dL 9.2 9.4 9.2    Current antihypertensive regimen:  Losartan 100 mg 1 tablet by mouth daily.  How often are you checking your Blood Pressure? Patients wife 1-2x per week.  Current home BP readings: Patients wife stated his blood pressure has been normal but she does not have any readings for me at this time.   What recent interventions/DTPs have been made by any provider to improve Blood Pressure control since last CPP Visit: None.  Any recent hospitalizations or ED visits since last visit with CPP? Patients wife stated   What  diet changes have been made to improve Blood Pressure Control?  Patients wife stated he eats very well. She stated she has a big appetite and eats all three meals.   What exercise is being done to improve your Blood Pressure Control?  Patients wife stated he is total care and can not do anything for himself.  Adherence Review: Is the patient currently on ACE/ARB medication? Losartan 100 mg   Does the patient have >5 day gap between last estimated fill dates?  Per misc rpts, no.  Star Rating Drugs:Pravastatin 20 mg 90 DS 11/15/20  Losartan 100 mg 90 DS 10/30/20  Follow-Up:Pharmacist Revie  Charlann Lange,  RMA Clinical Pharmacist Assistant [phone]  10 minutes spent in review, coordination, and documentation.  Reviewed by: Beverly Milch, PharmD Clinical Pharmacist [facility] [phone]

## 2021-01-23 ENCOUNTER — Emergency Department (HOSPITAL_COMMUNITY)
Admission: EM | Admit: 2021-01-23 | Discharge: 2021-01-24 | Disposition: A | Discharge status: Home / Self Care | Payer: PPO | Attending: Emergency Medicine | Admitting: Emergency Medicine

## 2021-01-23 ENCOUNTER — Encounter (HOSPITAL_COMMUNITY): Payer: Self-pay

## 2021-01-23 ENCOUNTER — Other Ambulatory Visit: Payer: Self-pay

## 2021-01-23 DIAGNOSIS — Z91040 Latex allergy status: Secondary | ICD-10-CM | POA: Diagnosis not present

## 2021-01-23 DIAGNOSIS — N182 Chronic kidney disease, stage 2 (mild): Secondary | ICD-10-CM | POA: Diagnosis not present

## 2021-01-23 DIAGNOSIS — Z87891 Personal history of nicotine dependence: Secondary | ICD-10-CM | POA: Diagnosis not present

## 2021-01-23 DIAGNOSIS — Z79899 Other long term (current) drug therapy: Secondary | ICD-10-CM | POA: Insufficient documentation

## 2021-01-23 DIAGNOSIS — W19XXXA Unspecified fall, initial encounter: Secondary | ICD-10-CM

## 2021-01-23 DIAGNOSIS — S8992XA Unspecified injury of left lower leg, initial encounter: Secondary | ICD-10-CM | POA: Diagnosis present

## 2021-01-23 DIAGNOSIS — S81812A Laceration without foreign body, left lower leg, initial encounter: Secondary | ICD-10-CM | POA: Diagnosis not present

## 2021-01-23 DIAGNOSIS — I4891 Unspecified atrial fibrillation: Secondary | ICD-10-CM | POA: Diagnosis not present

## 2021-01-23 DIAGNOSIS — I130 Hypertensive heart and chronic kidney disease with heart failure and stage 1 through stage 4 chronic kidney disease, or unspecified chronic kidney disease: Secondary | ICD-10-CM | POA: Diagnosis not present

## 2021-01-23 DIAGNOSIS — Z8546 Personal history of malignant neoplasm of prostate: Secondary | ICD-10-CM | POA: Insufficient documentation

## 2021-01-23 DIAGNOSIS — R079 Chest pain, unspecified: Secondary | ICD-10-CM | POA: Diagnosis not present

## 2021-01-23 DIAGNOSIS — R531 Weakness: Secondary | ICD-10-CM | POA: Insufficient documentation

## 2021-01-23 DIAGNOSIS — W06XXXA Fall from bed, initial encounter: Secondary | ICD-10-CM | POA: Insufficient documentation

## 2021-01-23 DIAGNOSIS — I509 Heart failure, unspecified: Secondary | ICD-10-CM | POA: Diagnosis not present

## 2021-01-23 DIAGNOSIS — M7989 Other specified soft tissue disorders: Secondary | ICD-10-CM | POA: Diagnosis not present

## 2021-01-23 DIAGNOSIS — S91312A Laceration without foreign body, left foot, initial encounter: Secondary | ICD-10-CM | POA: Diagnosis not present

## 2021-01-23 DIAGNOSIS — Z23 Encounter for immunization: Secondary | ICD-10-CM | POA: Diagnosis not present

## 2021-01-23 MED ORDER — LIDOCAINE-EPINEPHRINE (PF) 2 %-1:200000 IJ SOLN
20.0000 mL | Freq: Once | INTRAMUSCULAR | Status: AC
  Administered 2021-01-24: 20 mL
  Filled 2021-01-23: qty 20

## 2021-01-23 MED ORDER — TETANUS-DIPHTH-ACELL PERTUSSIS 5-2.5-18.5 LF-MCG/0.5 IM SUSY
0.5000 mL | PREFILLED_SYRINGE | Freq: Once | INTRAMUSCULAR | Status: AC
  Administered 2021-01-24: 0.5 mL via INTRAMUSCULAR
  Filled 2021-01-23: qty 0.5

## 2021-01-23 NOTE — ED Notes (Signed)
ED Provider at bedside. 

## 2021-01-23 NOTE — ED Triage Notes (Signed)
Pt was getting out of bed and tripped over a nightstand. Pt has a laceration on lower left leg that family wrapped prior to EMS arrival, bleeding controlled at this time. Pt denies hitting head but is on blood thinners.

## 2021-01-24 ENCOUNTER — Emergency Department (HOSPITAL_COMMUNITY): Payer: PPO

## 2021-01-24 DIAGNOSIS — M7989 Other specified soft tissue disorders: Secondary | ICD-10-CM | POA: Diagnosis not present

## 2021-01-24 DIAGNOSIS — R079 Chest pain, unspecified: Secondary | ICD-10-CM | POA: Diagnosis not present

## 2021-01-24 DIAGNOSIS — S91312A Laceration without foreign body, left foot, initial encounter: Secondary | ICD-10-CM | POA: Diagnosis not present

## 2021-01-24 DIAGNOSIS — S81812A Laceration without foreign body, left lower leg, initial encounter: Secondary | ICD-10-CM | POA: Diagnosis not present

## 2021-01-24 DIAGNOSIS — I4891 Unspecified atrial fibrillation: Secondary | ICD-10-CM | POA: Diagnosis not present

## 2021-01-24 LAB — URINALYSIS, ROUTINE W REFLEX MICROSCOPIC
Bacteria, UA: NONE SEEN
Bilirubin Urine: NEGATIVE
Glucose, UA: NEGATIVE mg/dL
Ketones, ur: 5 mg/dL — AB
Leukocytes,Ua: NEGATIVE
Nitrite: NEGATIVE
Protein, ur: 30 mg/dL — AB
RBC / HPF: >50 RBC/hpf — ABNORMAL HIGH (ref 0–5)
Specific Gravity, Urine: 1.018 (ref 1.005–1.030)
pH: 6 (ref 5.0–8.0)

## 2021-01-24 LAB — CBC WITH DIFFERENTIAL/PLATELET
Abs Immature Granulocytes: 0.04 10*3/uL (ref 0.00–0.07)
Basophils Absolute: 0 10*3/uL (ref 0.0–0.1)
Basophils Relative: 0 %
Eosinophils Absolute: 0 10*3/uL (ref 0.0–0.5)
Eosinophils Relative: 0 %
HCT: 37.5 % — ABNORMAL LOW (ref 39.0–52.0)
Hemoglobin: 11.6 g/dL — ABNORMAL LOW (ref 13.0–17.0)
Immature Granulocytes: 0 %
Lymphocytes Relative: 4 %
Lymphs Abs: 0.4 10*3/uL — ABNORMAL LOW (ref 0.7–4.0)
MCH: 28.4 pg (ref 26.0–34.0)
MCHC: 30.9 g/dL (ref 30.0–36.0)
MCV: 91.7 fL (ref 80.0–100.0)
Monocytes Absolute: 1 10*3/uL (ref 0.1–1.0)
Monocytes Relative: 11 %
Neutro Abs: 8.2 10*3/uL — ABNORMAL HIGH (ref 1.7–7.7)
Neutrophils Relative %: 85 %
Platelets: 165 10*3/uL (ref 150–400)
RBC: 4.09 MIL/uL — ABNORMAL LOW (ref 4.22–5.81)
RDW: 14.3 % (ref 11.5–15.5)
WBC: 9.7 10*3/uL (ref 4.0–10.5)
nRBC: 0 % (ref 0.0–0.2)

## 2021-01-24 LAB — COMPREHENSIVE METABOLIC PANEL
ALT: 13 U/L (ref 0–44)
AST: 17 U/L (ref 15–41)
Albumin: 3.6 g/dL (ref 3.5–5.0)
Alkaline Phosphatase: 66 U/L (ref 38–126)
Anion gap: 7 (ref 5–15)
BUN: 13 mg/dL (ref 8–23)
CO2: 28 mmol/L (ref 22–32)
Calcium: 9 mg/dL (ref 8.9–10.3)
Chloride: 102 mmol/L (ref 98–111)
Creatinine, Ser: 1 mg/dL (ref 0.61–1.24)
GFR, Estimated: >60 mL/min (ref >60)
Glucose, Bld: 173 mg/dL — ABNORMAL HIGH (ref 70–99)
Potassium: 4.2 mmol/L (ref 3.5–5.1)
Sodium: 137 mmol/L (ref 135–145)
Total Bilirubin: 2.2 mg/dL — ABNORMAL HIGH (ref 0.3–1.2)
Total Protein: 6.9 g/dL (ref 6.5–8.1)

## 2021-01-24 MED ORDER — POVIDONE-IODINE 10 % EX SOLN
CUTANEOUS | Status: AC
  Filled 2021-01-24: qty 30

## 2021-01-24 MED ORDER — SODIUM CHLORIDE 0.9 % IV BOLUS (SEPSIS)
1000.0000 mL | Freq: Once | INTRAVENOUS | Status: AC
  Administered 2021-01-24: 1000 mL via INTRAVENOUS

## 2021-01-24 NOTE — ED Notes (Signed)
ED Provider at bedside. 

## 2021-01-24 NOTE — ED Provider Notes (Signed)
[facility] EMERGENCY DEPARTMENT Provider Note   CSN: [id number] Arrival date & time: 01/23/21  2307     History Chief Complaint  Patient presents with  . Fall    Bernard Ramirez is a 87 y.o. male.  The history is provided by the patient and the spouse.  Fall This is a new problem. The current episode started less than 1 hour ago. The problem occurs constantly. The problem has not changed since onset.Pertinent negatives include no chest pain, no abdominal pain, no headaches and no shortness of breath. The symptoms are aggravated by walking. The symptoms are relieved by rest.   Patient with history of CVA, BPH, atrial fibrillation presents after a fall.  Patient was in bed and tripped and sustained laceration to the left leg.  No head injury or LOC.  No neck or back pain.  No other acute complaints  After my initial history, his wife arrived reported she was going to take him to the ER tomorrow anyways because he keeps falling and had difficulty walking for the past week.  No new medications.  Patient denies any other new complaints    Past Medical History:  Diagnosis Date  . Anemia    04/2012: H&H-10.2/31.5, MCV-77; 06/2012:12/38.9  . Arthritis   . Benign prostatic hypertrophy    s/p transurethral resection of the prostate  . Chest pain    2004; with dyspnea, stress nuclear-normal EF + questionable inferior wall ischemia, normal coronary angiography;  2010-negative stress nuclear  . CVA (cerebral infarction)    asymptomatic (seen on CT)  . Diarrhea    h/o Hemoccult-positive stool  . Dysrhythmia   . Edema of both legs   . GERD (gastroesophageal reflux disease)   . History of blood transfusion   . History of kidney stones   . History of urinary tract infection   . Hypertension   . Jerking movements of extremities    left arm   . Low back pain   . Obstructive sleep apnea    pt states can not use the CPAP  . Peripheral neuropathy    lower legs and feet bilat   .  Persistent atrial fibrillation ([place]) 2013   Asymptomatic; diagnosed in 05/2012  . Pneumonia   . Prostate cancer ([place]) 2017   stage 4  . Shortness of breath dyspnea    pt states only develops SOB if tries to do something too quickly  . Upper GI bleed 1985   1985-peptic ulcer disease; initial hemigastrectomy; subsequent Billroth II  . Urinary hesitancy   . Vertigo    ED evaluation-06/2012    Patient Active Problem List   Diagnosis Date Noted  . Cellulitis of lower extremity 10/08/2016  . Congestive heart failure (CHF) ([place]) 10/08/2016  . Acute kidney injury ([place]) 10/08/2016  . Acute renal insufficiency due to procedure 04/13/2016  . BPH (benign prostatic hypertrophy) with urinary obstruction 04/11/2016  . Shortness of breath 09/27/2015  . Preop cardiovascular exam 09/27/2015  . Melena 05/11/2015  . Rectal bleeding 05/11/2015  . Abnormal x-ray of abdomen 05/11/2015  . Absolute anemia   . ED (erectile dysfunction) of organic origin 11/06/2014  . Urine stream spraying 11/06/2014  . Benign non-nodular prostatic hyperplasia with lower urinary tract symptoms 11/06/2014  . Increased frequency of urination 10/12/2014  . 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 Urinary urgency 10/12/2014  . Atrial fibrillation by electrocardiogram ([place]) 11/08/2012  . Elevated blood pressure 11/08/2012  . Former smoker 11/08/2012  . Chronic  kidney disease, stage II (mild) 07/11/2012  . Fasting hyperglycemia 07/11/2012  . Chest pain   . Vertigo   . Atrial fibrillation ([place])   . Anemia 06/10/2009  . Essential hypertension 06/10/2009  . DIARRHEA, CHRONIC 06/10/2009  . PEPTIC ULCER, ACUTE, HEMORRHAGE, HX OF 06/10/2009  . DIVERTICULITIS, HX OF 06/10/2009    Past Surgical History:  Procedure Laterality Date  . Billroth II    . CHOLECYSTECTOMY    . COLONOSCOPY  Approximately 2000  . COLONOSCOPY  10/2012   Colonoscopy March 2014 at [facility], difficulty exam requiring fluoroscopy and  overtube. Patient developed severe bradycardia again during his procedure like he did [facility]. Multiple polyps removed which were tubular adenomas. Previously tattooed sites at 20 and 30 cm were free of any polypoid change. Left-sided diverticulosis noted.  . COLONOSCOPY WITH ESOPHAGOGASTRODUODENOSCOPY (EGD)  06-17-2009   OHY:WVPXTGGY tubular adenomas and 2 tubulovillous adenomas, incomplete, 3 clips placed  . ESOPHAGOGASTRODUODENOSCOPY  06/17/2009   SLF: normal/chronic gastritis (non-h pylori)  . ESOPHAGOGASTRODUODENOSCOPY N/A 05/11/2015   IRS:WNIOEVO anemai due to postgastrectomy state/moderate erosive gastritis  . INGUINAL HERNIA REPAIR     Left  . ROTATOR CUFF REPAIR  1991   Bilateral  . TRANSURETHRAL RESECTION OF PROSTATE  06/2016  . TRANSURETHRAL RESECTION OF PROSTATE N/A 12/27/2012   Procedure: TRANSURETHRAL RESECTION OF THE PROSTATE (TURP);  Surgeon: Marissa Nestle, MD;  Location: [facility];  Service: Urology;  Laterality: N/A;  . TRANSURETHRAL RESECTION OF PROSTATE N/A 04/11/2016   Procedure: TRANSURETHRAL RESECTION OF THE PROSTATE (TURP);  Surgeon: Irine Seal, MD;  Location: [facility];  Service: Urology;  Laterality: N/A;  . VAGOTOMY AND PYLOROPLASTY  1970s   Details of procedure uncertain; 3500X       Family History  Problem Relation Age of Onset  . Diabetes Mellitus II Mother   . Allergic rhinitis Mother   . Lung disease Father   . Allergic rhinitis Father   . Angioedema Neg Hx   . Asthma Neg Hx   . Atopy Neg Hx   . Eczema Neg Hx   . Immunodeficiency Neg Hx   . Urticaria Neg Hx   . Cystic fibrosis Neg Hx   . Lupus Neg Hx   . Emphysema Neg Hx   . Migraines Neg Hx     Social History   Tobacco Use  . Smoking status: Former Smoker    Years: 12.00    Types: Cigars    Quit date: 08/22/2003    Years since quitting: 17.4  . Smokeless tobacco: Never Used  . Tobacco comment: Quit many years ago; few cigars per day; didn't inhale  Vaping Use  . Vaping Use: Never  used  Substance Use Topics  . Alcohol use: No    Alcohol/week: 1.0 standard drink    Types: 1 Standard drinks or equivalent per week  . Drug use: No    Home Medications Prior to Admission medications   Medication Sig Start Date End Date Taking? Authorizing Provider  clobetasol cream (TEMOVATE) 3.81 % Apply 1 application topically daily as needed (use as directed for skin).     [provider]  clopidogrel (PLAVIX) 75 MG tablet TAKE 1 TABLET(75 MG) BY MOUTH DAILY 10/26/20   [name], MD  furosemide (LASIX) 40 MG tablet TAKE 1 AND 1/2 TABLETS BY MOUTH EVERY MORNING THEN TAKE 1 TABLET BY MOUTH EVERY AFTERNOON 11/29/20   Thompson Grayer, MD  HYDROcodone-acetaminophen (NORCO) 10-325 MG tablet TAKE 1 TABLET  FIVE TIMES  DAILY AS NEEDED FOR PAIN 12/21/20   [name], MD  hydrOXYzine (ATARAX/VISTARIL) 50 MG tablet Take 1 tablet (50 mg total) by mouth every 6 (six) hours as needed for itching. 10/12/16   Sinda Du, MD  losartan (COZAAR) 100 MG tablet TAKE 1 TABLET(100 MG) BY MOUTH DAILY 08/03/20   [name], MD  meclizine (ANTIVERT) 25 MG tablet TAKE 1 TABLET(25 MG) BY MOUTH AT BEDTIME 01/10/21   [name], MD  NYSTATIN powder APPLY TO THE AFFECTED AREA TOPICALLY EVERY DAY AS NEEDED FOR RASH 10/21/20   [name], MD  pantoprazole (PROTONIX) 40 MG tablet TAKE 1 TABLET(40 MG) BY MOUTH DAILY 07/29/20   [name], MD  potassium chloride SA (KLOR-CON) 20 MEQ tablet TAKE 1 TABLET(20 MEQ) BY MOUTH DAILY 10/18/20   [name], MD  pravastatin (PRAVACHOL) 20 MG tablet TAKE 1 TABLET(20 MG) BY MOUTH DAILY 08/16/20   [name], MD  triamcinolone cream (KENALOG) 0.1 % Apply 1 application topically daily as needed (for irritation).     [provider]    Allergies    Contrast media [iodinated diagnostic agents], Gabapentin, Aspirin, Latex, Lipitor [atorvastatin], and Tape  Review of Systems   Review of Systems  Constitutional: Negative  for fever.  Respiratory: Negative for cough and shortness of breath.   Cardiovascular: Negative for chest pain.  Gastrointestinal: Negative for abdominal pain, diarrhea and vomiting.  Musculoskeletal: Positive for myalgias.  Skin: Positive for wound.  Neurological: Negative for headaches.  All other systems reviewed and are negative.   Physical Exam Updated Vital Signs BP (!) 146/85   Pulse 78   Temp 99.3 F (37.4 C) (Oral)   Resp (!) 23   Ht 1.829 m (6')   Wt 124.7 kg   SpO2 96%   BMI 37.30 kg/m   Physical Exam CONSTITUTIONAL: Elderly, no acute distress HEAD: Normocephalic/atraumatic EYES: EOMI/PERRL ENMT: Mucous membranes moist NECK: supple no meningeal signs SPINE/BACK:entire spine nontender CV: S1/S2 noted, no loud murmurs LUNGS: Lungs are clear to auscultation bilaterally, no apparent distress ABDOMEN: soft, nontender, no rebound or guarding, bowel sounds noted throughout abdomen GU:no cva tenderness NEURO: Pt is awake/alert/appropriate, moves all extremitiesx4.  No facial droop.   EXTREMITIES: pulses normal/equal, full ROM, large laceration to left lower extremity, see photo below All other extremities/joints palpated/ranged and nontender SKIN: warm, scattered bruising throughout extremities PSYCH: no abnormalities of mood noted, alert and oriented to situation      ED Results / Procedures / Treatments   Labs (all labs ordered are listed, but only abnormal results are displayed) Labs Reviewed  CBC WITH DIFFERENTIAL/PLATELET - Abnormal; Notable for the following components:      Result Value   RBC 4.09 (*)    Hemoglobin 11.6 (*)    HCT 37.5 (*)    Neutro Abs 8.2 (*)    Lymphs Abs 0.4 (*)    All other components within normal limits  COMPREHENSIVE METABOLIC PANEL - Abnormal; Notable for the following components:   Glucose, Bld 173 (*)    Total Bilirubin 2.2 (*)    All other components within normal limits  URINALYSIS, ROUTINE W REFLEX MICROSCOPIC -  Abnormal; Notable for the following components:   Hgb urine dipstick SMALL (*)    Ketones, ur 5 (*)    Protein, ur 30 (*)    RBC / HPF >50 (*)    All other components within normal limits    EKG EKG Interpretation  Date/Time:  Monday January 24 2021 00:43:10 EDT Ventricular Rate:  76 PR Interval:    QRS Duration: 136 QT Interval:  381 QTC Calculation: 429 R Axis:   -57 Text Interpretation: Atrial fibrillation Nonspecific IVCD with LAD Consider inferior infarct Anterior infarct, old Nonspecific T abnormalities, lateral leads Confirmed by Ripley Fraise [phone]) on 01/24/2021 12:58:08 AM   Radiology DG Tibia/Fibula Left  Result Date: 01/24/2021 CLINICAL DATA:  Left lower extremity pain and laceration EXAM: LEFT TIBIA AND FIBULA - 2 VIEW COMPARISON:  None. FINDINGS: Two view radiograph of the left foreleg demonstrates normal alignment. No fracture or dislocation. A soft tissue defect is seen within the a soft tissues anterior to the distal diaphysis of the tibia in keeping with a soft tissue laceration. This appears to extend in close proximity to the tibial cortex. Extensive vascular calcifications are seen within the a left lower extremity. There is diffuse subcutaneous edema of the left lower extremity. IMPRESSION: No acute fracture or dislocation.  Soft tissue laceration. Electronically Signed   By: Fidela Salisbury MD   On: 01/24/2021 00:34   DG Chest Port 1 View  Result Date: 01/24/2021 CLINICAL DATA:  Chest pain, fall EXAM: PORTABLE CHEST 1 VIEW COMPARISON:  05/21/2019 FINDINGS: Elevation of the right hemidiaphragm is unchanged. Mild interstitial thickening is stable. No superimposed confluent pulmonary infiltrate. No pneumothorax or pleural effusion. Cardiac size is within normal limits. The pulmonary vascularity is normal. No acute bone abnormality. IMPRESSION: No active disease.  Stable elevation of the right hemidiaphragm. Electronically Signed   By: Fidela Salisbury MD   On: 01/24/2021  00:35   DG Foot Complete Left  Result Date: 01/24/2021 CLINICAL DATA:  Left foot injury, soft tissue laceration EXAM: LEFT FOOT - COMPLETE 3+ VIEW COMPARISON:  None. FINDINGS: Three view radiograph left foot demonstrates normal alignment. The osseous structures are diffusely osteopenic. No acute fracture or dislocation. Joint spaces are preserved. Bimalleolar soft tissue swelling is present, not well assessed on this exam IMPRESSION: No acute fracture or dislocation. Electronically Signed   By: Fidela Salisbury MD   On: 01/24/2021 03:03    Procedures .Marland KitchenLaceration Repair  Date/Time: 01/24/2021 2:20 AM Performed by: Ripley Fraise, MD Authorized by: Ripley Fraise, MD   Consent:    Consent obtained:  Verbal   Consent given by:  Patient   Risks, benefits, and alternatives were discussed: yes     Risks discussed:  Pain, poor cosmetic result and poor wound healing Universal protocol:    Patient identity confirmed:  Provided demographic data Anesthesia:    Anesthesia method:  Local infiltration   Local anesthetic:  Lidocaine 2% WITH epi Laceration details:    Location:  Leg   Leg location:  L lower leg   Length (cm):  10 Pre-procedure details:    Preparation:  Patient was prepped and draped in usual sterile fashion Exploration:    Imaging obtained: x-ray     Imaging outcome: foreign body not noted     Wound extent: no tendon damage noted and no underlying fracture noted     Contaminated: no   Treatment:    Amount of cleaning:  Standard Skin repair:    Repair method:  Sutures   Suture size:  3-0   Suture material:  Prolene   Suture technique:  Simple interrupted   Number of sutures:  9 Approximation:    Approximation:  Loose Repair type:    Repair type:  Complex Post-procedure details:    Procedure completion:  Tolerated well, no  immediate complications Comments:     Wound was approximated as much as possible, but due to skin tear there was a large area that was not amenable  to repair.  Petroleum gauze will be placed on the open wound portion and wrapped by nursing     Post suture repair:      Medications Ordered in ED Medications  lidocaine-EPINEPHrine (XYLOCAINE W/EPI) 2 %-1:200000 (PF) injection 20 mL (20 mLs Infiltration Given by Other 01/24/21 0207)  Tdap (BOOSTRIX) injection 0.5 mL (0.5 mLs Intramuscular Given 01/24/21 0050)  povidone-iodine (BETADINE) 10 % external solution ( Apply externally Given 01/24/21 0239)  sodium chloride 0.9 % bolus 1,000 mL (0 mLs Intravenous Stopped 01/24/21 0256)    ED Course  I have reviewed the triage vital signs and the nursing notes.  Pertinent labs & imaging results that were available during my care of the patient were reviewed by me and considered in my medical decision making (see chart for details).    MDM Rules/Calculators/A&P                          4:45 AM Patient seen after mechanical fall at home.  He denies any head injury or LOC.  He denies any dizziness, and has a normal mental status  he denies any new neck or back pain.  He sustained a laceration to his left leg.  The wound was approximated, but will also need wound care and Xeroform due to the skin tear portion.  No acute fractures noted in left lower extremity  I had a long discussion with patient and his spouse.  It now appears that he has had increasing generalized weakness for quite some time.  He can typically use a walker but this has become more challenging.  While lying in the bed, he has no focal motor deficits and can easily move his lower extremities.  He does report while standing his legs become weak, but no new back pain is reported.  This appears to be a chronic process  Patient prefers to go home.  This seems reasonable, and home health care has been ordered for patient.  I have also instructed patient to follow-up with his PCP in next several days for a wound check.  I advised patient and family that there may be delayed wound healing of  the skin tear.  A message was also sent to his PCP Final Clinical Impression(s) / ED Diagnoses Final diagnoses:  Fall, initial encounter  Laceration of left lower extremity, initial encounter  Weakness    Rx / DC Orders ED Discharge Orders    None       Ripley Fraise, MD 01/24/21 [phone]

## 2021-01-24 NOTE — Discharge Instructions (Addendum)

## 2021-01-27 ENCOUNTER — Ambulatory Visit: Payer: PPO | Admitting: Nurse Practitioner

## 2021-01-27 DIAGNOSIS — M170 Bilateral primary osteoarthritis of knee: Secondary | ICD-10-CM | POA: Diagnosis not present

## 2021-01-28 ENCOUNTER — Ambulatory Visit: Payer: PPO | Admitting: Nurse Practitioner

## 2021-02-01 ENCOUNTER — Encounter (HOSPITAL_COMMUNITY): Payer: Self-pay | Admitting: Emergency Medicine

## 2021-02-01 ENCOUNTER — Ambulatory Visit (INDEPENDENT_AMBULATORY_CARE_PROVIDER_SITE_OTHER): Payer: PPO | Admitting: Family Medicine

## 2021-02-01 ENCOUNTER — Other Ambulatory Visit: Payer: Self-pay

## 2021-02-01 ENCOUNTER — Inpatient Hospital Stay (HOSPITAL_COMMUNITY)
Admission: EM | Admit: 2021-02-01 | Discharge: 2021-02-05 | DRG: 300 | Disposition: A | Discharge status: Skilled Nursing Facility | Payer: PPO | Attending: Internal Medicine | Admitting: Internal Medicine

## 2021-02-01 ENCOUNTER — Other Ambulatory Visit: Payer: Self-pay | Admitting: Family Medicine

## 2021-02-01 ENCOUNTER — Emergency Department (HOSPITAL_COMMUNITY): Payer: PPO

## 2021-02-01 VITALS — BP 110/80 | HR 73 | Temp 98.0°F | Ht 72.0 in

## 2021-02-01 DIAGNOSIS — L0390 Cellulitis, unspecified: Secondary | ICD-10-CM | POA: Diagnosis present

## 2021-02-01 DIAGNOSIS — F419 Anxiety disorder, unspecified: Secondary | ICD-10-CM | POA: Diagnosis not present

## 2021-02-01 DIAGNOSIS — N400 Enlarged prostate without lower urinary tract symptoms: Secondary | ICD-10-CM | POA: Diagnosis not present

## 2021-02-01 DIAGNOSIS — F411 Generalized anxiety disorder: Secondary | ICD-10-CM | POA: Diagnosis not present

## 2021-02-01 DIAGNOSIS — M48061 Spinal stenosis, lumbar region without neurogenic claudication: Secondary | ICD-10-CM | POA: Diagnosis not present

## 2021-02-01 DIAGNOSIS — M85862 Other specified disorders of bone density and structure, left lower leg: Secondary | ICD-10-CM | POA: Diagnosis not present

## 2021-02-01 DIAGNOSIS — I4821 Permanent atrial fibrillation: Secondary | ICD-10-CM

## 2021-02-01 DIAGNOSIS — I96 Gangrene, not elsewhere classified: Secondary | ICD-10-CM | POA: Diagnosis not present

## 2021-02-01 DIAGNOSIS — Z79899 Other long term (current) drug therapy: Secondary | ICD-10-CM

## 2021-02-01 DIAGNOSIS — I110 Hypertensive heart disease with heart failure: Secondary | ICD-10-CM | POA: Diagnosis not present

## 2021-02-01 DIAGNOSIS — E1152 Type 2 diabetes mellitus with diabetic peripheral angiopathy with gangrene: Principal | ICD-10-CM | POA: Diagnosis present

## 2021-02-01 DIAGNOSIS — E46 Unspecified protein-calorie malnutrition: Secondary | ICD-10-CM | POA: Diagnosis present

## 2021-02-01 DIAGNOSIS — S81802A Unspecified open wound, left lower leg, initial encounter: Secondary | ICD-10-CM | POA: Diagnosis not present

## 2021-02-01 DIAGNOSIS — I639 Cerebral infarction, unspecified: Secondary | ICD-10-CM | POA: Diagnosis not present

## 2021-02-01 DIAGNOSIS — M6281 Muscle weakness (generalized): Secondary | ICD-10-CM | POA: Diagnosis not present

## 2021-02-01 DIAGNOSIS — M1990 Unspecified osteoarthritis, unspecified site: Secondary | ICD-10-CM | POA: Diagnosis not present

## 2021-02-01 DIAGNOSIS — Z515 Encounter for palliative care: Secondary | ICD-10-CM | POA: Diagnosis not present

## 2021-02-01 DIAGNOSIS — R159 Full incontinence of feces: Secondary | ICD-10-CM | POA: Diagnosis not present

## 2021-02-01 DIAGNOSIS — Z789 Other specified health status: Secondary | ICD-10-CM | POA: Diagnosis not present

## 2021-02-01 DIAGNOSIS — E1143 Type 2 diabetes mellitus with diabetic autonomic (poly)neuropathy: Secondary | ICD-10-CM | POA: Diagnosis not present

## 2021-02-01 DIAGNOSIS — N184 Chronic kidney disease, stage 4 (severe): Secondary | ICD-10-CM | POA: Diagnosis not present

## 2021-02-01 DIAGNOSIS — Z20822 Contact with and (suspected) exposure to covid-19: Secondary | ICD-10-CM | POA: Diagnosis present

## 2021-02-01 DIAGNOSIS — G4733 Obstructive sleep apnea (adult) (pediatric): Secondary | ICD-10-CM | POA: Diagnosis not present

## 2021-02-01 DIAGNOSIS — N39498 Other specified urinary incontinence: Secondary | ICD-10-CM | POA: Diagnosis not present

## 2021-02-01 DIAGNOSIS — I4819 Other persistent atrial fibrillation: Secondary | ICD-10-CM | POA: Diagnosis not present

## 2021-02-01 DIAGNOSIS — Z91041 Radiographic dye allergy status: Secondary | ICD-10-CM

## 2021-02-01 DIAGNOSIS — C61 Malignant neoplasm of prostate: Secondary | ICD-10-CM | POA: Diagnosis not present

## 2021-02-01 DIAGNOSIS — Z87891 Personal history of nicotine dependence: Secondary | ICD-10-CM | POA: Diagnosis not present

## 2021-02-01 DIAGNOSIS — Z886 Allergy status to analgesic agent status: Secondary | ICD-10-CM

## 2021-02-01 DIAGNOSIS — R627 Adult failure to thrive: Secondary | ICD-10-CM

## 2021-02-01 DIAGNOSIS — G253 Myoclonus: Secondary | ICD-10-CM | POA: Diagnosis not present

## 2021-02-01 DIAGNOSIS — Z833 Family history of diabetes mellitus: Secondary | ICD-10-CM

## 2021-02-01 DIAGNOSIS — I10 Essential (primary) hypertension: Secondary | ICD-10-CM | POA: Diagnosis not present

## 2021-02-01 DIAGNOSIS — I130 Hypertensive heart and chronic kidney disease with heart failure and stage 1 through stage 4 chronic kidney disease, or unspecified chronic kidney disease: Secondary | ICD-10-CM | POA: Diagnosis present

## 2021-02-01 DIAGNOSIS — L03116 Cellulitis of left lower limb: Secondary | ICD-10-CM

## 2021-02-01 DIAGNOSIS — M7989 Other specified soft tissue disorders: Secondary | ICD-10-CM | POA: Diagnosis not present

## 2021-02-01 DIAGNOSIS — Z6834 Body mass index (BMI) 34.0-34.9, adult: Secondary | ICD-10-CM | POA: Diagnosis not present

## 2021-02-01 DIAGNOSIS — E440 Moderate protein-calorie malnutrition: Secondary | ICD-10-CM | POA: Diagnosis present

## 2021-02-01 DIAGNOSIS — E441 Mild protein-calorie malnutrition: Secondary | ICD-10-CM

## 2021-02-01 DIAGNOSIS — Z91040 Latex allergy status: Secondary | ICD-10-CM

## 2021-02-01 DIAGNOSIS — Z8546 Personal history of malignant neoplasm of prostate: Secondary | ICD-10-CM

## 2021-02-01 DIAGNOSIS — E1159 Type 2 diabetes mellitus with other circulatory complications: Secondary | ICD-10-CM | POA: Diagnosis not present

## 2021-02-01 DIAGNOSIS — I5032 Chronic diastolic (congestive) heart failure: Secondary | ICD-10-CM | POA: Diagnosis not present

## 2021-02-01 DIAGNOSIS — I4891 Unspecified atrial fibrillation: Secondary | ICD-10-CM | POA: Diagnosis present

## 2021-02-01 DIAGNOSIS — Z8673 Personal history of transient ischemic attack (TIA), and cerebral infarction without residual deficits: Secondary | ICD-10-CM | POA: Diagnosis not present

## 2021-02-01 DIAGNOSIS — R600 Localized edema: Secondary | ICD-10-CM | POA: Diagnosis not present

## 2021-02-01 DIAGNOSIS — R2681 Unsteadiness on feet: Secondary | ICD-10-CM | POA: Diagnosis not present

## 2021-02-01 DIAGNOSIS — Z888 Allergy status to other drugs, medicaments and biological substances status: Secondary | ICD-10-CM

## 2021-02-01 DIAGNOSIS — Z7902 Long term (current) use of antithrombotics/antiplatelets: Secondary | ICD-10-CM | POA: Diagnosis not present

## 2021-02-01 DIAGNOSIS — Z659 Problem related to unspecified psychosocial circumstances: Secondary | ICD-10-CM

## 2021-02-01 DIAGNOSIS — Z609 Problem related to social environment, unspecified: Secondary | ICD-10-CM

## 2021-02-01 DIAGNOSIS — M549 Dorsalgia, unspecified: Secondary | ICD-10-CM | POA: Diagnosis present

## 2021-02-01 DIAGNOSIS — S81802S Unspecified open wound, left lower leg, sequela: Secondary | ICD-10-CM

## 2021-02-01 DIAGNOSIS — R262 Difficulty in walking, not elsewhere classified: Secondary | ICD-10-CM | POA: Diagnosis not present

## 2021-02-01 DIAGNOSIS — G629 Polyneuropathy, unspecified: Secondary | ICD-10-CM | POA: Diagnosis present

## 2021-02-01 DIAGNOSIS — F0390 Unspecified dementia without behavioral disturbance: Secondary | ICD-10-CM | POA: Diagnosis not present

## 2021-02-01 DIAGNOSIS — M1712 Unilateral primary osteoarthritis, left knee: Secondary | ICD-10-CM | POA: Diagnosis not present

## 2021-02-01 DIAGNOSIS — M5126 Other intervertebral disc displacement, lumbar region: Secondary | ICD-10-CM | POA: Diagnosis not present

## 2021-02-01 DIAGNOSIS — K219 Gastro-esophageal reflux disease without esophagitis: Secondary | ICD-10-CM | POA: Diagnosis present

## 2021-02-01 DIAGNOSIS — G893 Neoplasm related pain (acute) (chronic): Secondary | ICD-10-CM | POA: Diagnosis not present

## 2021-02-01 DIAGNOSIS — Z66 Do not resuscitate: Secondary | ICD-10-CM | POA: Diagnosis not present

## 2021-02-01 DIAGNOSIS — Z9181 History of falling: Secondary | ICD-10-CM | POA: Diagnosis not present

## 2021-02-01 DIAGNOSIS — M25475 Effusion, left foot: Secondary | ICD-10-CM | POA: Diagnosis not present

## 2021-02-01 DIAGNOSIS — M5127 Other intervertebral disc displacement, lumbosacral region: Secondary | ICD-10-CM | POA: Diagnosis not present

## 2021-02-01 DIAGNOSIS — M159 Polyosteoarthritis, unspecified: Secondary | ICD-10-CM | POA: Diagnosis not present

## 2021-02-01 DIAGNOSIS — Z7189 Other specified counseling: Secondary | ICD-10-CM | POA: Diagnosis not present

## 2021-02-01 HISTORY — DX: Cellulitis, unspecified: L03.90

## 2021-02-01 LAB — COMPREHENSIVE METABOLIC PANEL
ALT: 14 U/L (ref 0–44)
AST: 16 U/L (ref 15–41)
Albumin: 3.2 g/dL — ABNORMAL LOW (ref 3.5–5.0)
Alkaline Phosphatase: 85 U/L (ref 38–126)
Anion gap: 8 (ref 5–15)
BUN: 13 mg/dL (ref 8–23)
CO2: 25 mmol/L (ref 22–32)
Calcium: 8.8 mg/dL — ABNORMAL LOW (ref 8.9–10.3)
Chloride: 106 mmol/L (ref 98–111)
Creatinine, Ser: 0.85 mg/dL (ref 0.61–1.24)
GFR, Estimated: >60 mL/min (ref >60)
Glucose, Bld: 134 mg/dL — ABNORMAL HIGH (ref 70–99)
Potassium: 4.3 mmol/L (ref 3.5–5.1)
Sodium: 139 mmol/L (ref 135–145)
Total Bilirubin: 1.2 mg/dL (ref 0.3–1.2)
Total Protein: 6.5 g/dL (ref 6.5–8.1)

## 2021-02-01 LAB — URINALYSIS, ROUTINE W REFLEX MICROSCOPIC
Bilirubin Urine: NEGATIVE
Glucose, UA: NEGATIVE mg/dL
Hgb urine dipstick: NEGATIVE
Ketones, ur: NEGATIVE mg/dL
Leukocytes,Ua: NEGATIVE
Nitrite: NEGATIVE
Protein, ur: NEGATIVE mg/dL
Specific Gravity, Urine: 1.015 (ref 1.005–1.030)
pH: 6 (ref 5.0–8.0)

## 2021-02-01 LAB — CBC WITH DIFFERENTIAL/PLATELET
Abs Immature Granulocytes: 0.03 10*3/uL (ref 0.00–0.07)
Basophils Absolute: 0 10*3/uL (ref 0.0–0.1)
Basophils Relative: 0 %
Eosinophils Absolute: 0.1 10*3/uL (ref 0.0–0.5)
Eosinophils Relative: 2 %
HCT: 34.9 % — ABNORMAL LOW (ref 39.0–52.0)
Hemoglobin: 10.7 g/dL — ABNORMAL LOW (ref 13.0–17.0)
Immature Granulocytes: 1 %
Lymphocytes Relative: 13 %
Lymphs Abs: 0.8 10*3/uL (ref 0.7–4.0)
MCH: 27.9 pg (ref 26.0–34.0)
MCHC: 30.7 g/dL (ref 30.0–36.0)
MCV: 90.9 fL (ref 80.0–100.0)
Monocytes Absolute: 0.7 10*3/uL (ref 0.1–1.0)
Monocytes Relative: 11 %
Neutro Abs: 4.9 10*3/uL (ref 1.7–7.7)
Neutrophils Relative %: 73 %
Platelets: 266 10*3/uL (ref 150–400)
RBC: 3.84 MIL/uL — ABNORMAL LOW (ref 4.22–5.81)
RDW: 14.3 % (ref 11.5–15.5)
WBC: 6.6 10*3/uL (ref 4.0–10.5)
nRBC: 0 % (ref 0.0–0.2)

## 2021-02-01 LAB — LACTIC ACID, PLASMA: Lactic Acid, Venous: 0.9 mmol/L (ref 0.5–1.9)

## 2021-02-01 MED ORDER — ACETAMINOPHEN 325 MG PO TABS
650.0000 mg | ORAL_TABLET | Freq: Four times a day (QID) | ORAL | Status: DC | PRN
  Administered 2021-02-03: 650 mg via ORAL
  Filled 2021-02-01: qty 2

## 2021-02-01 MED ORDER — PANTOPRAZOLE SODIUM 40 MG PO TBEC
40.0000 mg | DELAYED_RELEASE_TABLET | Freq: Every day | ORAL | Status: DC
  Administered 2021-02-02 – 2021-02-05 (×4): 40 mg via ORAL
  Filled 2021-02-01 (×4): qty 1

## 2021-02-01 MED ORDER — FUROSEMIDE 10 MG/ML IJ SOLN
40.0000 mg | Freq: Two times a day (BID) | INTRAMUSCULAR | Status: DC
  Administered 2021-02-01 – 2021-02-02 (×2): 40 mg via INTRAVENOUS
  Filled 2021-02-01 (×2): qty 4

## 2021-02-01 MED ORDER — MORPHINE SULFATE (PF) 2 MG/ML IV SOLN: 2.0000 mg | INTRAVENOUS | Status: DC | PRN

## 2021-02-01 MED ORDER — PRAVASTATIN SODIUM 10 MG PO TABS
20.0000 mg | ORAL_TABLET | Freq: Every day | ORAL | Status: DC
  Administered 2021-02-02 – 2021-02-04 (×3): 20 mg via ORAL
  Filled 2021-02-01 (×4): qty 2

## 2021-02-01 MED ORDER — ENOXAPARIN SODIUM 60 MG/0.6ML IJ SOSY
60.0000 mg | PREFILLED_SYRINGE | INTRAMUSCULAR | Status: DC
  Administered 2021-02-01 – 2021-02-04 (×4): 60 mg via SUBCUTANEOUS
  Filled 2021-02-01 (×4): qty 0.6

## 2021-02-01 MED ORDER — CEFAZOLIN SODIUM-DEXTROSE 2-4 GM/100ML-% IV SOLN
2.0000 g | Freq: Once | INTRAVENOUS | Status: AC
  Administered 2021-02-01: 2 g via INTRAVENOUS
  Filled 2021-02-01: qty 100

## 2021-02-01 MED ORDER — FUROSEMIDE 20 MG PO TABS: 20.0000 mg | ORAL_TABLET | Freq: Every day | ORAL | Status: DC

## 2021-02-01 MED ORDER — ONDANSETRON HCL 4 MG/2ML IJ SOLN: 4.0000 mg | Freq: Four times a day (QID) | INTRAMUSCULAR | Status: DC | PRN

## 2021-02-01 MED ORDER — ACETAMINOPHEN 650 MG RE SUPP: 650.0000 mg | Freq: Four times a day (QID) | RECTAL | Status: DC | PRN

## 2021-02-01 MED ORDER — CEFAZOLIN SODIUM-DEXTROSE 2-4 GM/100ML-% IV SOLN
2.0000 g | Freq: Three times a day (TID) | INTRAVENOUS | Status: DC
  Administered 2021-02-02 – 2021-02-05 (×11): 2 g via INTRAVENOUS
  Filled 2021-02-01 (×12): qty 100

## 2021-02-01 MED ORDER — LOSARTAN POTASSIUM 50 MG PO TABS
100.0000 mg | ORAL_TABLET | Freq: Every day | ORAL | Status: DC
  Administered 2021-02-02: 100 mg via ORAL
  Filled 2021-02-01: qty 2

## 2021-02-01 MED ORDER — HYDROCODONE-ACETAMINOPHEN 10-325 MG PO TABS: ORAL_TABLET | ORAL | 0 refills | Status: DC

## 2021-02-01 MED ORDER — HYDROXYZINE HCL 25 MG PO TABS
50.0000 mg | ORAL_TABLET | Freq: Four times a day (QID) | ORAL | Status: DC | PRN
  Administered 2021-02-02 – 2021-02-04 (×3): 50 mg via ORAL
  Filled 2021-02-01 (×4): qty 2

## 2021-02-01 MED ORDER — HYDROCODONE-ACETAMINOPHEN 10-325 MG PO TABS
1.0000 | ORAL_TABLET | Freq: Four times a day (QID) | ORAL | Status: DC | PRN
  Administered 2021-02-01 – 2021-02-02 (×2): 1 via ORAL
  Filled 2021-02-01 (×2): qty 1

## 2021-02-01 MED ORDER — CLOPIDOGREL BISULFATE 75 MG PO TABS
75.0000 mg | ORAL_TABLET | Freq: Every day | ORAL | Status: DC
  Administered 2021-02-02 – 2021-02-05 (×4): 75 mg via ORAL
  Filled 2021-02-01 (×4): qty 1

## 2021-02-01 MED ORDER — MECLIZINE HCL 25 MG PO TABS: ORAL_TABLET | ORAL | 0 refills | Status: DC

## 2021-02-01 MED ORDER — ONDANSETRON HCL 4 MG PO TABS: 4.0000 mg | ORAL_TABLET | Freq: Four times a day (QID) | ORAL | Status: DC | PRN

## 2021-02-01 NOTE — H&P (Signed)
TRH H&P    Patient Demographics:    Bernard Ramirez, is a 87 y.o. male  MRN: [id number]  DOB - 09-Aug-1931  Admit Date - 02/01/2021  Referring MD/NP/PA: Alfonzo Feller  Outpatient Primary MD for the patient is [name], MD  Patient coming from: Home  Chief complaint- cellulitis   HPI:    Bernard Ramirez  is a 87 y.o. male, with history of prostate cancer, CVA, dementia, GERD, hypertension, low back pain, persistent atrial fibrillation, and more presents ED with a chief complaint of cellulitis.  Patient request that I take history from wife, reporting that he cannot remember anything.  Wife reports that 10 days ago patient had a fall where he hit his leg against something causing a skin tear and laceration.  He was seen in our ED by Dr. Christy Gentles, and the wound was repaired.  Antibiotics were not indicated at that time.  Patient has developed generalized weakness since then.  Wife reports that patient cannot walk without max assist because of pain and weakness.  Wife reports the patient is also had increased swelling in that lower extremity, despite taking 40 mg of Lasix at home.  He has been on 60 mg Lasix in the past, but was recently reduced.  Patient has not had any fevers.  Wife is concerned that patient may have metastasis causing his generalized weakness, as he was diagnosed with stage IV prostate cancer 3.5 years ago and opted not to do treatment.  Wife reports the patient has had a good appetite, drinks plenty of liquid.  She reports that he has chronic urinary and bowel incontinence.  When she is not noted him to have any other complaints.  Patient does not smoke, does not drink alcohol, is vaccinated for COVID, patient remains full code.  In the ED Temp 98, heart rate 73-86, respiratory rate 18, blood pressure 135/83, satting at 98% No leukocytosis with white blood cell count of 6.6, hemoglobin 10.7 Chemistry  panel is unremarkable UA is pending Lactic normal Albumin 3.2 X-ray left foot shows soft tissue swelling of forefoot Wife reports that she cannot safely take patient home until he was able to stand on his own requesting rehab   Review of systems:    Unfortunately review of systems could not be obtained secondary to patient's dementia    Past History of the following :    Past Medical History:  Diagnosis Date   Anemia    04/2012: H&H-10.2/31.5, MCV-77; 06/2012:12/38.9   Arthritis    Benign prostatic hypertrophy    s/p transurethral resection of the prostate   Chest pain    2004; with dyspnea, stress nuclear-normal EF + questionable inferior wall ischemia, normal coronary angiography;  2010-negative stress nuclear   CVA (cerebral infarction)    asymptomatic (seen on CT)   Diarrhea    h/o Hemoccult-positive stool   Dysrhythmia    Edema of both legs    GERD (gastroesophageal reflux disease)    History of blood transfusion    History of kidney stones    History  of urinary tract infection    Hypertension    Jerking movements of extremities    left arm    Low back pain    Obstructive sleep apnea    pt states can not use the CPAP   Peripheral neuropathy    lower legs and feet bilat    Persistent atrial fibrillation ([place]) 2013   Asymptomatic; diagnosed in 05/2012   Pneumonia    Prostate cancer ([place]) 2017   stage 4   Shortness of breath dyspnea    pt states only develops SOB if tries to do something too quickly   Upper GI bleed 1985   1985-peptic ulcer disease; initial hemigastrectomy; subsequent Billroth II   Urinary hesitancy    Vertigo    ED evaluation-06/2012      Past Surgical History:  Procedure Laterality Date   Billroth II     CHOLECYSTECTOMY     COLONOSCOPY  Approximately 2000   COLONOSCOPY  10/2012   Colonoscopy March 2014 at [facility], difficulty exam requiring fluoroscopy and overtube. Patient developed severe bradycardia again during his procedure like he did  [facility]. Multiple polyps removed which were tubular adenomas. Previously tattooed sites at 20 and 30 cm were free of any polypoid change. Left-sided diverticulosis noted.   COLONOSCOPY WITH ESOPHAGOGASTRODUODENOSCOPY (EGD)  06-17-2009   IRW:[id number] tubular adenomas and 2 tubulovillous adenomas, incomplete, 3 clips placed   ESOPHAGOGASTRODUODENOSCOPY  06/17/2009   SLF: normal/chronic gastritis (non-h pylori)   ESOPHAGOGASTRODUODENOSCOPY N/A 05/11/2015   QPY:[id number] anemai due to postgastrectomy state/moderate erosive gastritis   INGUINAL HERNIA REPAIR     Left   ROTATOR CUFF REPAIR  1991   Bilateral   TRANSURETHRAL RESECTION OF PROSTATE  06/2016   TRANSURETHRAL RESECTION OF PROSTATE N/A 12/27/2012   Procedure: TRANSURETHRAL RESECTION OF THE PROSTATE (TURP);  Surgeon: Marissa Nestle, MD;  Location: [facility];  Service: Urology;  Laterality: N/A;   TRANSURETHRAL RESECTION OF PROSTATE N/A 04/11/2016   Procedure: TRANSURETHRAL RESECTION OF THE PROSTATE (TURP);  Surgeon: Irine Seal, MD;  Location: [facility];  Service: Urology;  Laterality: N/A;   VAGOTOMY AND PYLOROPLASTY  1970s   Details of procedure uncertain; 6712W      Social History:      Social History   Tobacco Use   Smoking status: Former    Pack years: 0.00    Types: Cigars    Quit date: 08/22/2003    Years since quitting: 17.4   Smokeless tobacco: Never   Tobacco comments:    Quit many years ago; few cigars per day; didn't inhale  Substance Use Topics   Alcohol use: No    Alcohol/week: 1.0 standard drink    Types: 1 Standard drinks or equivalent per week       Family History :     Family History  Problem Relation Age of Onset   Diabetes Mellitus II Mother    Allergic rhinitis Mother    Lung disease Father    Allergic rhinitis Father    Angioedema Neg Hx    Asthma Neg Hx    Atopy Neg Hx    Eczema Neg Hx    Immunodeficiency Neg Hx    Urticaria Neg Hx    Cystic fibrosis Neg Hx    Lupus Neg Hx     Emphysema Neg Hx    Migraines Neg Hx       Home Medications:   Prior to Admission medications   Medication Sig Start Date End Date Taking? Authorizing  Provider  clobetasol cream (TEMOVATE) 6.38 % Apply 1 application topically daily as needed (use as directed for skin).     [provider]  clopidogrel (PLAVIX) 75 MG tablet TAKE 1 TABLET(75 MG) BY MOUTH DAILY 10/26/20   [name], MD  furosemide (LASIX) 40 MG tablet TAKE 1 AND 1/2 TABLETS BY MOUTH EVERY MORNING THEN TAKE 1 TABLET BY MOUTH EVERY AFTERNOON 11/29/20   Allred, Jeneen Rinks, MD  HYDROcodone-acetaminophen (NORCO) 10-325 MG tablet TAKE 1 TABLET  FIVE TIMES DAILY AS NEEDED FOR PAIN 02/01/21   [name], MD  hydrOXYzine (ATARAX/VISTARIL) 50 MG tablet Take 1 tablet (50 mg total) by mouth every 6 (six) hours as needed for itching. 10/12/16   Sinda Du, MD  losartan (COZAAR) 100 MG tablet TAKE 1 TABLET(100 MG) BY MOUTH DAILY 08/03/20   [name], MD  meclizine (ANTIVERT) 25 MG tablet TAKE 1 TABLET(25 MG) BY MOUTH AT BEDTIME 02/01/21   [name], MD  NYSTATIN powder APPLY TO THE AFFECTED AREA TOPICALLY EVERY DAY AS NEEDED FOR RASH 10/21/20   [name], MD  pantoprazole (PROTONIX) 40 MG tablet TAKE 1 TABLET(40 MG) BY MOUTH DAILY 07/29/20   [name], MD  potassium chloride SA (KLOR-CON) 20 MEQ tablet TAKE 1 TABLET(20 MEQ) BY MOUTH DAILY 10/18/20   [name], MD  pravastatin (PRAVACHOL) 20 MG tablet TAKE 1 TABLET(20 MG) BY MOUTH DAILY 08/16/20   [name], MD  triamcinolone cream (KENALOG) 0.1 % Apply 1 application topically daily as needed (for irritation).     [provider]     Allergies:     Allergies  Allergen Reactions   Contrast Media [Iodinated Diagnostic Agents]     Pt states he was given "dye" 20 yrs ago, anaphylaxis & syncope - was told to never have it again    Gabapentin Itching   Aspirin Other (See Comments)    Blistering    Latex Rash   Lipitor  [Atorvastatin] Itching   Tape Rash    EKG leads     Physical Exam:   Vitals  Blood pressure (!) 159/91, pulse 81, temperature 97.8 F (36.6 C), resp. rate 19, height 6' (1.829 m), weight 114.6 kg, SpO2 100 %.  1.  General: Patient lying supine in bed,  no acute distress   2. Psychiatric: Alert and oriented x 3, mood and behavior normal for situation, pleasant and cooperative with exam   3. Neurologic: Speech and language are normal, face is symmetric, moves all 4 extremities voluntarily, at baseline without acute deficits on limited exam   4. HEENMT:  Head is atraumatic, normocephalic, pupils reactive to light, neck is supple, trachea is midline, mucous membranes are moist   5. Respiratory : Lungs are clear to auscultation bilaterally without wheezing, rhonchi, rales, no cyanosis, no increase in work of breathing or accessory muscle use   6. Cardiovascular : Heart rate normal, irregularly irregular, no murmurs, rubs or gallops, bilateral peripheral edema, peripheral pulses palpated   7. Gastrointestinal:  Abdomen is soft, nondistended, nontender to palpation bowel sounds active, no masses or organomegaly palpated   8. Skin:  Partially repaired left lower extremity wound with a white-colored cream on top, no malodorous drainage, surrounding erythema and pitting edema   9.Musculoskeletal:  No acute deformities or trauma, no asymmetry in tone, n bilateral pitting edema worse on left than right, peripheral pulses palpated,     Data Review:    CBC Recent Labs  Lab  02/01/21 1740  WBC 6.6  HGB 10.7*  HCT 34.9*  PLT 266  MCV 90.9  MCH 27.9  MCHC 30.7  RDW 14.3  LYMPHSABS 0.8  MONOABS 0.7  EOSABS 0.1  BASOSABS 0.0   ------------------------------------------------------------------------------------------------------------------  Results for orders placed or performed during the hospital encounter of 02/01/21 (from the past 48 hour(s))  CBC with  Differential/Platelet     Status: Abnormal   Collection Time: 02/01/21  5:40 PM  Result Value Ref Range   WBC 6.6 4.0 - 10.5 K/uL   RBC 3.84 (L) 4.22 - 5.81 MIL/uL   Hemoglobin 10.7 (L) 13.0 - 17.0 g/dL   HCT 34.9 (L) 39.0 - 52.0 %   MCV 90.9 80.0 - 100.0 fL   MCH 27.9 26.0 - 34.0 pg   MCHC 30.7 30.0 - 36.0 g/dL   RDW 14.3 11.5 - 15.5 %   Platelets 266 150 - 400 K/uL   nRBC 0.0 0.0 - 0.2 %   Neutrophils Relative % 73 %   Neutro Abs 4.9 1.7 - 7.7 K/uL   Lymphocytes Relative 13 %   Lymphs Abs 0.8 0.7 - 4.0 K/uL   Monocytes Relative 11 %   Monocytes Absolute 0.7 0.1 - 1.0 K/uL   Eosinophils Relative 2 %   Eosinophils Absolute 0.1 0.0 - 0.5 K/uL   Basophils Relative 0 %   Basophils Absolute 0.0 0.0 - 0.1 K/uL   Immature Granulocytes 1 %   Abs Immature Granulocytes 0.03 0.00 - 0.07 K/uL    Comment: Performed at [facility], [address] [place], [place] [address]  Comprehensive metabolic panel     Status: Abnormal   Collection Time: 02/01/21  5:40 PM  Result Value Ref Range   Sodium 139 135 - 145 mmol/L   Potassium 4.3 3.5 - 5.1 mmol/L   Chloride 106 98 - 111 mmol/L   CO2 25 22 - 32 mmol/L   Glucose, Bld 134 (H) 70 - 99 mg/dL    Comment: Glucose reference range applies only to samples taken after fasting for at least 8 hours.   BUN 13 8 - 23 mg/dL   Creatinine, Ser 0.85 0.61 - 1.24 mg/dL   Calcium 8.8 (L) 8.9 - 10.3 mg/dL   Total Protein 6.5 6.5 - 8.1 g/dL   Albumin 3.2 (L) 3.5 - 5.0 g/dL   AST 16 15 - 41 U/L   ALT 14 0 - 44 U/L   Alkaline Phosphatase 85 38 - 126 U/L   Total Bilirubin 1.2 0.3 - 1.2 mg/dL   GFR, Estimated >60 >60 mL/min    Comment: (NOTE) Calculated using the CKD-EPI Creatinine Equation (2021)    Anion gap 8 5 - 15    Comment: Performed at [facility], [address], [place], [place] [address]  Lactic acid, plasma     Status: None   Collection Time: 02/01/21  6:32 PM  Result Value Ref Range   Lactic Acid, Venous 0.9 0.5 - 1.9 mmol/L    Comment:  Performed at [facility], [address] [place], [place] [address]  Urinalysis, Routine w reflex microscopic Urine, Clean Catch     Status: None   Collection Time: 02/01/21  7:34 PM  Result Value Ref Range   Color, Urine YELLOW YELLOW   APPearance CLEAR CLEAR   Specific Gravity, Urine 1.015 1.005 - 1.030   pH 6.0 5.0 - 8.0   Glucose, UA NEGATIVE NEGATIVE mg/dL   Hgb urine dipstick NEGATIVE NEGATIVE   Bilirubin Urine  NEGATIVE NEGATIVE   Ketones, ur NEGATIVE NEGATIVE mg/dL   Protein, ur NEGATIVE NEGATIVE mg/dL   Nitrite NEGATIVE NEGATIVE   Leukocytes,Ua NEGATIVE NEGATIVE    Comment: Microscopic not done on urines with negative protein, blood, leukocytes, nitrite, or glucose < 500 mg/dL. Performed at [facility], [address], [place], [place] [address]     Chemistries  Recent Labs  Lab 02/01/21 1740  NA 139  K 4.3  CL 106  CO2 25  GLUCOSE 134*  BUN 13  CREATININE 0.85  CALCIUM 8.8*  AST 16  ALT 14  ALKPHOS 85  BILITOT 1.2   ------------------------------------------------------------------------------------------------------------------  ------------------------------------------------------------------------------------------------------------------ GFR: Estimated Creatinine Clearance: 78.5 mL/min (by C-G formula based on SCr of 0.85 mg/dL). Liver Function Tests: Recent Labs  Lab 02/01/21 1740  AST 16  ALT 14  ALKPHOS 85  BILITOT 1.2  PROT 6.5  ALBUMIN 3.2*   No results for input(s): LIPASE, AMYLASE in the last 168 hours. No results for input(s): AMMONIA in the last 168 hours. Coagulation Profile: No results for input(s): INR, PROTIME in the last 168 hours. Cardiac Enzymes: No results for input(s): CKTOTAL, CKMB, CKMBINDEX, TROPONINI in the last 168 hours. BNP (last 3 results) No results for input(s): PROBNP in the last 8760 hours. HbA1C: No results for input(s): HGBA1C in the last 72 hours. CBG: No results for input(s): GLUCAP in the last 168  hours. Lipid Profile: No results for input(s): CHOL, HDL, LDLCALC, TRIG, CHOLHDL, LDLDIRECT in the last 72 hours. Thyroid Function Tests: No results for input(s): TSH, T4TOTAL, FREET4, T3FREE, THYROIDAB in the last 72 hours. Anemia Panel: No results for input(s): VITAMINB12, FOLATE, FERRITIN, TIBC, IRON, RETICCTPCT in the last 72 hours.  --------------------------------------------------------------------------------------------------------------- Urine analysis:    Component Value Date/Time   COLORURINE YELLOW 02/01/2021 1934   APPEARANCEUR CLEAR 02/01/2021 1934   LABSPEC 1.015 02/01/2021 1934   PHURINE 6.0 02/01/2021 1934   GLUCOSEU NEGATIVE 02/01/2021 1934   HGBUR NEGATIVE 02/01/2021 1934   BILIRUBINUR NEGATIVE 02/01/2021 1934   KETONESUR NEGATIVE 02/01/2021 1934   PROTEINUR NEGATIVE 02/01/2021 1934   UROBILINOGEN 1.0 12/22/2012 1742   NITRITE NEGATIVE 02/01/2021 1934   LEUKOCYTESUR NEGATIVE 02/01/2021 1934      Imaging Results:    DG Tibia/Fibula Left  Result Date: 02/01/2021 CLINICAL DATA:  87 year old male with left lower extremity swelling. EXAM: LEFT FOOT - COMPLETE 3+ VIEW; LEFT TIBIA AND FIBULA - 2 VIEW COMPARISON:  Left foot radiograph dated 01/24/2021. FINDINGS: There is no acute fracture or dislocation. The bones are osteopenic. There is arthritic changes of the left knee with moderate narrowing of the medial compartment. There is a small suprapatellar effusion. There is diffuse subcutaneous edema and worsened soft tissue swelling of the forefoot. No radiopaque foreign object or soft tissue gas. IMPRESSION: 1. No acute fracture or dislocation. 2. Osteopenia. 3. Diffuse subcutaneous edema and worsened soft tissue swelling of the forefoot. Electronically Signed   By: Anner Crete M.D.   On: 02/01/2021 19:19   DG Foot Complete Left  Result Date: 02/01/2021 CLINICAL DATA:  87 year old male with left lower extremity swelling. EXAM: LEFT FOOT - COMPLETE 3+ VIEW; LEFT  TIBIA AND FIBULA - 2 VIEW COMPARISON:  Left foot radiograph dated 01/24/2021. FINDINGS: There is no acute fracture or dislocation. The bones are osteopenic. There is arthritic changes of the left knee with moderate narrowing of the medial compartment. There is a small suprapatellar effusion. There is diffuse subcutaneous edema and worsened soft tissue swelling of the forefoot. No radiopaque  foreign object or soft tissue gas. IMPRESSION: 1. No acute fracture or dislocation. 2. Osteopenia. 3. Diffuse subcutaneous edema and worsened soft tissue swelling of the forefoot. Electronically Signed   By: Anner Crete M.D.   On: 02/01/2021 19:19       Assessment & Plan:    Principal Problem:   Cellulitis Active Problems:   Back pain   Atrial fibrillation (HCC)   Social problem   Protein calorie malnutrition (HCC)   Anxiety   Cellulitis Outpatient antibiotics trialed PCP sent to ER for IV antibiotics Afebrile, without tachycardia or tachypnea, stable blood pressure, lactic normal, no leukocytosis Left lower extremity is erythematous and edematous Started on Ancef in the ED, continue Ancef.  Per cellulitis focused order set Continue to monitor Social problem Wife reports she cannot take care of patient at home he is able to stand on his own Transition of care to help with rehab placement PT eval and treat Atrial fibrillation Not anticoagulated per home med list - per cardiology note patient is not a candidate for oral anticoagulation due to prior GI bleed Rate controlled Continue to monitor Back pain Chronic Continue Norco My concern that stage IV prostate cancer he has possibly mets to back causing patient weakness and pain Consider further imaging of the back in the a.m., at this time patient is reporting back pain is secondary to laying in bed, generalized weakness without focal weakness Protein calorie malnutrition Mild with an albumin of 3.2 Wife reports good appetite at  home Encourage nutrient dense food options Continue to monitor Anxiety Continue Atarax    DVT Prophylaxis-   Lovenox - SCDs   AM Labs Ordered, also please review Full Orders  Family Communication: Admission, patients condition and plan of care including tests being ordered have been discussed with the patient and wife who indicate understanding and agree with the plan and Code Status.  Code Status: Full  Admission status: Inpatient :The appropriate admission status for this patient is INPATIENT. Inpatient status is judged to be reasonable and necessary in order to provide the required intensity of service to ensure the patient's safety. The patient's presenting symptoms, physical exam findings, and initial radiographic and laboratory data in the context of their chronic comorbidities is felt to place them at high risk for further clinical deterioration. Furthermore, it is not anticipated that the patient will be medically stable for discharge from the hospital within 2 midnights of admission. The following factors support the admission status of inpatient.     The patient's presenting symptoms include, wound, failure to thrive. The worrisome physical exam findings include generalized weakness. The initial radiographic and laboratory data are worrisome because of soft tissue swelling of forefoot on left foot x-ray, albumin 3.2 The chronic co-morbidities include prostate cancer, persistent atrial fibrillation, hypertension.       * I certify that at the point of admission it is my clinical judgment that the patient will require inpatient hospital care spanning beyond 2 midnights from the point of admission due to high intensity of service, high risk for further deterioration and high frequency of surveillance required.*  Time spent in minutes : Staples

## 2021-02-01 NOTE — ED Provider Notes (Signed)
[facility] EMERGENCY DEPARTMENT Provider Note   CSN: [id number] Arrival date & time: 02/01/21  1706     History Chief Complaint  Patient presents with   Leg Swelling    Bernard Ramirez is a 87 y.o. male with a history of anemia, arthritis, history of asymptomatic CVA, hypertension, chronic peripheral edema, atrial fibrillation on Plavix presenting from his 16 office for IV antibiotics for a left lower leg cellulitis.  He was seen here on June 5 for a laceration sustained to this leg after a trip and fall.  He was seen here at which time he had a loose closure using sutures.  Since then he has had increasing pain, redness and swelling of the extremity.  He denies fevers or chills, nausea, vomiting, flulike symptoms or any constitutional symptoms.  He has had increasing difficulty walking to the point where he has been unable to weight-bear or ambulate secondary to pain and weakness.  Patient lives at home with his wife who is unable to care for him in his current condition.  His PCP sent him here for IV antibiotics.  He may also need rehab before he is able to safely return to his home.  The history is provided by the patient.      Past Medical History:  Diagnosis Date   Anemia    04/2012: H&H-10.2/31.5, MCV-77; 06/2012:12/38.9   Arthritis    Benign prostatic hypertrophy    s/p transurethral resection of the prostate   Chest pain    2004; with dyspnea, stress nuclear-normal EF + questionable inferior wall ischemia, normal coronary angiography;  2010-negative stress nuclear   CVA (cerebral infarction)    asymptomatic (seen on CT)   Diarrhea    h/o Hemoccult-positive stool   Dysrhythmia    Edema of both legs    GERD (gastroesophageal reflux disease)    History of blood transfusion    History of kidney stones    History of urinary tract infection    Hypertension    Jerking movements of extremities    left arm    Low back pain    Obstructive sleep apnea    pt states  can not use the CPAP   Peripheral neuropathy    lower legs and feet bilat    Persistent atrial fibrillation ([place]) 2013   Asymptomatic; diagnosed in 05/2012   Pneumonia    Prostate cancer ([place]) 2017   stage 4   Shortness of breath dyspnea    pt states only develops SOB if tries to do something too quickly   Upper GI bleed 1985   1985-peptic ulcer disease; initial hemigastrectomy; subsequent Billroth II   Urinary hesitancy    Vertigo    ED evaluation-06/2012    Patient Active Problem List   Diagnosis Date Noted   Cellulitis 02/01/2021   Cellulitis of lower extremity 10/08/2016   Congestive heart failure (CHF) ([place]) 10/08/2016   Acute kidney injury ([place]) 10/08/2016   Acute renal insufficiency due to procedure 04/13/2016   BPH (benign prostatic hypertrophy) with urinary obstruction 04/11/2016   Shortness of breath 09/27/2015   Preop cardiovascular exam 09/27/2015   Melena 05/11/2015   Rectal bleeding 05/11/2015   Abnormal x-ray of abdomen 05/11/2015   Absolute anemia    ED (erectile dysfunction) of organic origin 11/06/2014   Urine stream spraying 11/06/2014   Benign non-nodular prostatic hyperplasia with lower urinary tract symptoms 11/06/2014   Increased frequency of urination 10/12/2014   Post-traumatic bulbous urethral stricture 10/12/2014  Urinary urgency 10/12/2014   Atrial fibrillation by electrocardiogram ([place]) 11/08/2012   Elevated blood pressure 11/08/2012   Former smoker 11/08/2012   Chronic kidney disease, stage II (mild) 07/11/2012   Fasting hyperglycemia 07/11/2012   Chest pain    Vertigo    Atrial fibrillation ([place])    Anemia 06/10/2009   Essential hypertension 06/10/2009   DIARRHEA, CHRONIC 06/10/2009   PEPTIC ULCER, ACUTE, HEMORRHAGE, HX OF 06/10/2009   DIVERTICULITIS, HX OF 06/10/2009    Past Surgical History:  Procedure Laterality Date   Billroth II     CHOLECYSTECTOMY     COLONOSCOPY  Approximately 2000   COLONOSCOPY  10/2012   Colonoscopy  March 2014 at [facility], difficulty exam requiring fluoroscopy and overtube. Patient developed severe bradycardia again during his procedure like he did [facility]. Multiple polyps removed which were tubular adenomas. Previously tattooed sites at 20 and 30 cm were free of any polypoid change. Left-sided diverticulosis noted.   COLONOSCOPY WITH ESOPHAGOGASTRODUODENOSCOPY (EGD)  06-17-2009   IRC:VELFYBOF tubular adenomas and 2 tubulovillous adenomas, incomplete, 3 clips placed   ESOPHAGOGASTRODUODENOSCOPY  06/17/2009   SLF: normal/chronic gastritis (non-h pylori)   ESOPHAGOGASTRODUODENOSCOPY N/A 05/11/2015   BPZ:WCHENID anemai due to postgastrectomy state/moderate erosive gastritis   INGUINAL HERNIA REPAIR     Left   ROTATOR CUFF REPAIR  1991   Bilateral   TRANSURETHRAL RESECTION OF PROSTATE  06/2016   TRANSURETHRAL RESECTION OF PROSTATE N/A 12/27/2012   Procedure: TRANSURETHRAL RESECTION OF THE PROSTATE (TURP);  Surgeon: Marissa Nestle, MD;  Location: [facility];  Service: Urology;  Laterality: N/A;   TRANSURETHRAL RESECTION OF PROSTATE N/A 04/11/2016   Procedure: TRANSURETHRAL RESECTION OF THE PROSTATE (TURP);  Surgeon: Irine Seal, MD;  Location: [facility];  Service: Urology;  Laterality: N/A;   VAGOTOMY AND PYLOROPLASTY  1970s   Details of procedure uncertain; 7824M       Family History  Problem Relation Age of Onset   Diabetes Mellitus II Mother    Allergic rhinitis Mother    Lung disease Father    Allergic rhinitis Father    Angioedema Neg Hx    Asthma Neg Hx    Atopy Neg Hx    Eczema Neg Hx    Immunodeficiency Neg Hx    Urticaria Neg Hx    Cystic fibrosis Neg Hx    Lupus Neg Hx    Emphysema Neg Hx    Migraines Neg Hx     Social History   Tobacco Use   Smoking status: Former    Pack years: 0.00    Types: Cigars    Quit date: 08/22/2003    Years since quitting: 17.4   Smokeless tobacco: Never   Tobacco comments:    Quit many years ago; few cigars per day; didn't inhale   Vaping Use   Vaping Use: Never used  Substance Use Topics   Alcohol use: No    Alcohol/week: 1.0 standard drink    Types: 1 Standard drinks or equivalent per week   Drug use: No    Home Medications Prior to Admission medications   Medication Sig Start Date End Date Taking? Authorizing Provider  clobetasol cream (TEMOVATE) 3.53 % Apply 1 application topically daily as needed (use as directed for skin).     [provider]  clopidogrel (PLAVIX) 75 MG tablet TAKE 1 TABLET(75 MG) BY MOUTH DAILY 10/26/20   [name], MD  furosemide (LASIX) 40 MG tablet TAKE 1 AND 1/2 TABLETS BY MOUTH EVERY  MORNING THEN TAKE 1 TABLET BY MOUTH EVERY AFTERNOON 11/29/20   Allred, Jeneen Rinks, MD  HYDROcodone-acetaminophen (NORCO) 10-325 MG tablet TAKE 1 TABLET  FIVE TIMES DAILY AS NEEDED FOR PAIN 02/01/21   [name], MD  hydrOXYzine (ATARAX/VISTARIL) 50 MG tablet Take 1 tablet (50 mg total) by mouth every 6 (six) hours as needed for itching. 10/12/16   Sinda Du, MD  losartan (COZAAR) 100 MG tablet TAKE 1 TABLET(100 MG) BY MOUTH DAILY 08/03/20   [name], MD  meclizine (ANTIVERT) 25 MG tablet TAKE 1 TABLET(25 MG) BY MOUTH AT BEDTIME 02/01/21   [name], MD  NYSTATIN powder APPLY TO THE AFFECTED AREA TOPICALLY EVERY DAY AS NEEDED FOR RASH 10/21/20   [name], MD  pantoprazole (PROTONIX) 40 MG tablet TAKE 1 TABLET(40 MG) BY MOUTH DAILY 07/29/20   [name], MD  potassium chloride SA (KLOR-CON) 20 MEQ tablet TAKE 1 TABLET(20 MEQ) BY MOUTH DAILY 10/18/20   [name], MD  pravastatin (PRAVACHOL) 20 MG tablet TAKE 1 TABLET(20 MG) BY MOUTH DAILY 08/16/20   [name], MD  triamcinolone cream (KENALOG) 0.1 % Apply 1 application topically daily as needed (for irritation).     [provider]    Allergies    Contrast media [iodinated diagnostic agents], Gabapentin, Aspirin, Latex, Lipitor [atorvastatin], and Tape  Review of Systems   Review of  Systems  Constitutional:  Negative for chills and fever.  HENT:  Negative for congestion.   Eyes: Negative.   Respiratory:  Negative for chest tightness and shortness of breath.   Cardiovascular:  Negative for chest pain.  Gastrointestinal:  Negative for abdominal pain, nausea and vomiting.  Genitourinary: Negative.   Musculoskeletal:  Negative for arthralgias, joint swelling and neck pain.  Skin:  Positive for wound. Negative for rash.  Neurological:  Positive for weakness. Negative for dizziness, light-headedness, numbness and headaches.  Psychiatric/Behavioral: Negative.     Physical Exam Updated Vital Signs BP 135/83 (BP Location: Right Arm)   Pulse 86   Temp 98.4 F (36.9 C) (Oral)   Resp 18   Ht 6' (1.829 m)   Wt 125.2 kg   SpO2 98%   BMI 37.43 kg/m   Physical Exam Vitals and nursing note reviewed.  Constitutional:      Appearance: He is well-developed.  HENT:     Head: Normocephalic and atraumatic.  Eyes:     Conjunctiva/sclera: Conjunctivae normal.  Cardiovascular:     Rate and Rhythm: Normal rate and regular rhythm.     Heart sounds: Normal heart sounds.  Pulmonary:     Effort: Pulmonary effort is normal.     Breath sounds: Normal breath sounds. No wheezing.  Abdominal:     General: Bowel sounds are normal.     Palpations: Abdomen is soft.     Tenderness: There is no abdominal tenderness. There is no guarding.  Musculoskeletal:        General: Normal range of motion.     Cervical back: Normal range of motion.  Skin:    General: Skin is warm and dry.     Findings: Erythema present.     Comments: Bilateral lower extremity edema, left far more than the right.  Large area of erythema surrounding loosely approximated laceration left anterior lower leg.   Neurological:     Mental Status: He is alert.    ED Results / Procedures / Treatments   Labs (all labs ordered are listed, but only  abnormal results are displayed) Labs Reviewed  CBC WITH  DIFFERENTIAL/PLATELET - Abnormal; Notable for the following components:      Result Value   RBC 3.84 (*)    Hemoglobin 10.7 (*)    HCT 34.9 (*)    All other components within normal limits  COMPREHENSIVE METABOLIC PANEL - Abnormal; Notable for the following components:   Glucose, Bld 134 (*)    Calcium 8.8 (*)    Albumin 3.2 (*)    All other components within normal limits  SARS CORONAVIRUS 2 (TAT 6-24 HRS)  LACTIC ACID, PLASMA  URINALYSIS, ROUTINE W REFLEX MICROSCOPIC    EKG None  Radiology DG Tibia/Fibula Left  Result Date: 02/01/2021 CLINICAL DATA:  87 year old male with left lower extremity swelling. EXAM: LEFT FOOT - COMPLETE 3+ VIEW; LEFT TIBIA AND FIBULA - 2 VIEW COMPARISON:  Left foot radiograph dated 01/24/2021. FINDINGS: There is no acute fracture or dislocation. The bones are osteopenic. There is arthritic changes of the left knee with moderate narrowing of the medial compartment. There is a small suprapatellar effusion. There is diffuse subcutaneous edema and worsened soft tissue swelling of the forefoot. No radiopaque foreign object or soft tissue gas. IMPRESSION: 1. No acute fracture or dislocation. 2. Osteopenia. 3. Diffuse subcutaneous edema and worsened soft tissue swelling of the forefoot. Electronically Signed   By: Anner Crete M.D.   On: 02/01/2021 19:19   DG Foot Complete Left  Result Date: 02/01/2021 CLINICAL DATA:  87 year old male with left lower extremity swelling. EXAM: LEFT FOOT - COMPLETE 3+ VIEW; LEFT TIBIA AND FIBULA - 2 VIEW COMPARISON:  Left foot radiograph dated 01/24/2021. FINDINGS: There is no acute fracture or dislocation. The bones are osteopenic. There is arthritic changes of the left knee with moderate narrowing of the medial compartment. There is a small suprapatellar effusion. There is diffuse subcutaneous edema and worsened soft tissue swelling of the forefoot. No radiopaque foreign object or soft tissue gas. IMPRESSION: 1. No acute fracture or  dislocation. 2. Osteopenia. 3. Diffuse subcutaneous edema and worsened soft tissue swelling of the forefoot. Electronically Signed   By: Anner Crete M.D.   On: 02/01/2021 19:19    Procedures Procedures   Medications Ordered in ED Medications  ceFAZolin (ANCEF) IVPB 2g/100 mL premix (has no administration in time range)  ceFAZolin (ANCEF) IVPB 2g/100 mL premix (0 g Intravenous Stopped 02/01/21 1835)    ED Course  I have reviewed the triage vital signs and the nursing notes.  Pertinent labs & imaging results that were available during my care of the patient were reviewed by me and considered in my medical decision making (see chart for details).    MDM Rules/Calculators/A&P                          Labs and imaging reviewed and discussed with patient.  He has a significant cellulitis and is amenable to admission.  He understands that he is not doing well at home and may need temporary rehab placement while he is healing from this infection.  He was given IV cefazolin.  Call placed to the hospitalist for admission.  Dr. Clearence Ped accepts pt for admission. Final Clinical Impression(s) / ED Diagnoses Final diagnoses:  Cellulitis of left lower extremity    Rx / DC Orders ED Discharge Orders     None        Landis Martins 02/01/21 2037    Davonna Belling, MD 02/01/21 2354

## 2021-02-01 NOTE — Progress Notes (Signed)
Subjective:    Patient ID: Bernard Ramirez, male    DOB: Oct 09, 1930, 87 y.o.   MRN: [id number] Patient fell June 5 sustaining a large skin tear to his left leg.  He went to the emergency room where the wound was approximated with sutures.  A large portion remained open to be closed through secondary intention.  The skin is now erythematous, hot, swollen, and edematous.  He reports severe pain in the foot and in the lower leg distal to his knee.  The patient has cellulitis in the left lower extremity.  The wound is not healing and there is drainage coming from the open wound.  Wife states that he has become progressively weak.  He is no longer able to stand or walk.  She is unable to care for him at home.  He cannot even stand to go to the bathroom.  He denies any chest pain or shortness of breath or fever or nausea or vomiting.  However he does report feeling weak and unable to care for himself or perform his ADLs. Past Medical History:  Diagnosis Date   Anemia    04/2012: H&H-10.2/31.5, MCV-77; 06/2012:12/38.9   Arthritis    Benign prostatic hypertrophy    s/p transurethral resection of the prostate   Chest pain    2004; with dyspnea, stress nuclear-normal EF + questionable inferior wall ischemia, normal coronary angiography;  2010-negative stress nuclear   CVA (cerebral infarction)    asymptomatic (seen on CT)   Diarrhea    h/o Hemoccult-positive stool   Dysrhythmia    Edema of both legs    GERD (gastroesophageal reflux disease)    History of blood transfusion    History of kidney stones    History of urinary tract infection    Hypertension    Jerking movements of extremities    left arm    Low back pain    Obstructive sleep apnea    pt states can not use the CPAP   Peripheral neuropathy    lower legs and feet bilat    Persistent atrial fibrillation ([place]) 2013   Asymptomatic; diagnosed in 05/2012   Pneumonia    Prostate cancer ([place]) 2017   stage 4   Shortness of breath dyspnea     pt states only develops SOB if tries to do something too quickly   Upper GI bleed 1985   1985-peptic ulcer disease; initial hemigastrectomy; subsequent Billroth II   Urinary hesitancy    Vertigo    ED evaluation-06/2012   Past Surgical History:  Procedure Laterality Date   Billroth II     CHOLECYSTECTOMY     COLONOSCOPY  Approximately 2000   COLONOSCOPY  10/2012   Colonoscopy March 2014 at [facility], difficulty exam requiring fluoroscopy and overtube. Patient developed severe bradycardia again during his procedure like he did [facility]. Multiple polyps removed which were tubular adenomas. Previously tattooed sites at 20 and 30 cm were free of any polypoid change. Left-sided diverticulosis noted.   COLONOSCOPY WITH ESOPHAGOGASTRODUODENOSCOPY (EGD)  06-17-2009   LHT:DSKAJGOT tubular adenomas and 2 tubulovillous adenomas, incomplete, 3 clips placed   ESOPHAGOGASTRODUODENOSCOPY  06/17/2009   SLF: normal/chronic gastritis (non-h pylori)   ESOPHAGOGASTRODUODENOSCOPY N/A 05/11/2015   LXB:WIOMBTD anemai due to postgastrectomy state/moderate erosive gastritis   INGUINAL HERNIA REPAIR     Left   ROTATOR CUFF REPAIR  1991   Bilateral   TRANSURETHRAL RESECTION OF PROSTATE  06/2016   TRANSURETHRAL RESECTION OF PROSTATE N/A 12/27/2012  Procedure: TRANSURETHRAL RESECTION OF THE PROSTATE (TURP);  Surgeon: Marissa Nestle, MD;  Location: [facility];  Service: Urology;  Laterality: N/A;   TRANSURETHRAL RESECTION OF PROSTATE N/A 04/11/2016   Procedure: TRANSURETHRAL RESECTION OF THE PROSTATE (TURP);  Surgeon: Irine Seal, MD;  Location: [facility];  Service: Urology;  Laterality: N/A;   VAGOTOMY AND PYLOROPLASTY  1970s   Details of procedure uncertain; 4665L   Current Outpatient Medications on File Prior to Visit  Medication Sig Dispense Refill   clobetasol cream (TEMOVATE) 9.35 % Apply 1 application topically daily as needed (use as directed for skin).      clopidogrel (PLAVIX) 75 MG tablet TAKE 1  TABLET(75 MG) BY MOUTH DAILY 90 tablet 3   furosemide (LASIX) 40 MG tablet TAKE 1 AND 1/2 TABLETS BY MOUTH EVERY MORNING THEN TAKE 1 TABLET BY MOUTH EVERY AFTERNOON 225 tablet 2   HYDROcodone-acetaminophen (NORCO) 10-325 MG tablet TAKE 1 TABLET  FIVE TIMES DAILY AS NEEDED FOR PAIN 150 tablet 0   hydrOXYzine (ATARAX/VISTARIL) 50 MG tablet Take 1 tablet (50 mg total) by mouth every 6 (six) hours as needed for itching. 30 tablet 0   losartan (COZAAR) 100 MG tablet TAKE 1 TABLET(100 MG) BY MOUTH DAILY 90 tablet 2   meclizine (ANTIVERT) 25 MG tablet TAKE 1 TABLET(25 MG) BY MOUTH AT BEDTIME 30 tablet 0   NYSTATIN powder APPLY TO THE AFFECTED AREA TOPICALLY EVERY DAY AS NEEDED FOR RASH 60 g 3   pantoprazole (PROTONIX) 40 MG tablet TAKE 1 TABLET(40 MG) BY MOUTH DAILY 90 tablet 2   potassium chloride SA (KLOR-CON) 20 MEQ tablet TAKE 1 TABLET(20 MEQ) BY MOUTH DAILY 30 tablet 6   pravastatin (PRAVACHOL) 20 MG tablet TAKE 1 TABLET(20 MG) BY MOUTH DAILY 90 tablet 2   triamcinolone cream (KENALOG) 0.1 % Apply 1 application topically daily as needed (for irritation).      No current facility-administered medications on file prior to visit.   Allergies  Allergen Reactions   Contrast Media [Iodinated Diagnostic Agents]     Pt states he was given "dye" 20 yrs ago, anaphylaxis & syncope - was told to never have it again    Gabapentin Itching   Aspirin Other (See Comments)    Blistering    Latex Rash   Lipitor [Atorvastatin] Itching   Tape Rash    EKG leads   Social History   Socioeconomic History   Marital status: Married    Spouse name: Not on file   Number of children: 5   Years of education: Not on file   Highest education level: Not on file  Occupational History   Occupation: Retired Pensions consultant: RETIRED  Tobacco Use   Smoking status: Former    Pack years: 0.00    Types: Cigars    Quit date: 08/22/2003    Years since quitting: 17.4   Smokeless tobacco: Never   Tobacco  comments:    Quit many years ago; few cigars per day; didn't inhale  Vaping Use   Vaping Use: Never used  Substance and Sexual Activity   Alcohol use: No    Alcohol/week: 1.0 standard drink    Types: 1 Standard drinks or equivalent per week   Drug use: No   Sexual activity: Not on file  Other Topics Concern   Not on file  Social History Narrative   Lives w/ wife in [place]   Retired truck Geophysicist/field seismologist   Social Determinants of Health  Financial Resource Strain: Not on file  Food Insecurity: Not on file  Transportation Needs: Not on file  Physical Activity: Not on file  Stress: Not on file  Social Connections: Not on file  Intimate Partner Violence: Not on file      Review of Systems  All other systems reviewed and are negative.     Objective:   Physical Exam Vitals reviewed.  Constitutional:      General: He is not in acute distress.    Appearance: Normal appearance. He is obese. He is not ill-appearing or toxic-appearing.  Cardiovascular:     Rate and Rhythm: Normal rate. Rhythm irregular.     Heart sounds: Normal heart sounds. No murmur heard.   No friction rub. No gallop.  Pulmonary:     Effort: Pulmonary effort is normal. No respiratory distress.     Breath sounds: Normal breath sounds. No stridor. No wheezing, rhonchi or rales.  Abdominal:     General: Abdomen is flat. Bowel sounds are normal. There is no distension.     Palpations: Abdomen is soft. There is no mass.     Tenderness: There is no abdominal tenderness. There is no guarding.  Musculoskeletal:     Right lower leg: Edema present.     Left lower leg: Edema present.     Right foot: Decreased range of motion. Swelling and deformity present.  Skin:    Findings: Erythema, signs of injury and laceration present.       Neurological:     General: No focal deficit present.     Mental Status: He is alert and oriented to person, place, and time.     Cranial Nerves: No cranial nerve deficit.     Sensory:  No sensory deficit.     Motor: No weakness.     Coordination: Coordination normal.     Gait: Gait abnormal (Unable to stand and walk.  Confined to a wheelchair).     Deep Tendon Reflexes: Reflexes normal.  Psychiatric:        Mood and Affect: Mood normal.        Thought Content: Thought content normal.        Judgment: Judgment normal.          Assessment & Plan:  Left leg cellulitis  Open wound of left lower extremity, sequela  Failure to thrive in adult We discussed outpatient treatment including oral antibiotics such as Keflex and outpatient admission to a nursing home for rehab due to his deconditioning and failure to thrive.  However the patient's family is unable to care for him and perform his ADLs because he is too weak.  Furthermore he appears to have a significant cellulitis that would benefit from IV antibiotics as well as wound care that would benefit from skilled nursing.  Therefore I recommended that he go to the emergency room.  He would benefit from short-term admission for 24 to 48 hours to begin IV antibiotics and wound care and then likely short-term placement in a rehab facility for intensive physical therapy

## 2021-02-01 NOTE — ED Triage Notes (Signed)
Pt has swelling and redness to the left leg and foot.

## 2021-02-02 ENCOUNTER — Inpatient Hospital Stay (HOSPITAL_COMMUNITY): Payer: PPO

## 2021-02-02 LAB — COMPREHENSIVE METABOLIC PANEL
ALT: 11 U/L (ref 0–44)
AST: 14 U/L — ABNORMAL LOW (ref 15–41)
Albumin: 2.9 g/dL — ABNORMAL LOW (ref 3.5–5.0)
Alkaline Phosphatase: 77 U/L (ref 38–126)
Anion gap: 6 (ref 5–15)
BUN: 13 mg/dL (ref 8–23)
CO2: 28 mmol/L (ref 22–32)
Calcium: 8.5 mg/dL — ABNORMAL LOW (ref 8.9–10.3)
Chloride: 104 mmol/L (ref 98–111)
Creatinine, Ser: 0.92 mg/dL (ref 0.61–1.24)
GFR, Estimated: >60 mL/min (ref >60)
Glucose, Bld: 134 mg/dL — ABNORMAL HIGH (ref 70–99)
Potassium: 4 mmol/L (ref 3.5–5.1)
Sodium: 138 mmol/L (ref 135–145)
Total Bilirubin: 0.5 mg/dL (ref 0.3–1.2)
Total Protein: 6 g/dL — ABNORMAL LOW (ref 6.5–8.1)

## 2021-02-02 LAB — CBC WITH DIFFERENTIAL/PLATELET
Abs Immature Granulocytes: 0.02 10*3/uL (ref 0.00–0.07)
Basophils Absolute: 0 10*3/uL (ref 0.0–0.1)
Basophils Relative: 0 %
Eosinophils Absolute: 0.2 10*3/uL (ref 0.0–0.5)
Eosinophils Relative: 3 %
HCT: 34.1 % — ABNORMAL LOW (ref 39.0–52.0)
Hemoglobin: 10.5 g/dL — ABNORMAL LOW (ref 13.0–17.0)
Immature Granulocytes: 0 %
Lymphocytes Relative: 16 %
Lymphs Abs: 1 10*3/uL (ref 0.7–4.0)
MCH: 28 pg (ref 26.0–34.0)
MCHC: 30.8 g/dL (ref 30.0–36.0)
MCV: 90.9 fL (ref 80.0–100.0)
Monocytes Absolute: 0.8 10*3/uL (ref 0.1–1.0)
Monocytes Relative: 12 %
Neutro Abs: 4.4 10*3/uL (ref 1.7–7.7)
Neutrophils Relative %: 69 %
Platelets: 252 10*3/uL (ref 150–400)
RBC: 3.75 MIL/uL — ABNORMAL LOW (ref 4.22–5.81)
RDW: 14.3 % (ref 11.5–15.5)
WBC: 6.5 10*3/uL (ref 4.0–10.5)
nRBC: 0 % (ref 0.0–0.2)

## 2021-02-02 LAB — MAGNESIUM: Magnesium: 2.1 mg/dL (ref 1.7–2.4)

## 2021-02-02 LAB — SARS CORONAVIRUS 2 (TAT 6-24 HRS): SARS Coronavirus 2: NEGATIVE

## 2021-02-02 MED ORDER — HYDROCODONE-ACETAMINOPHEN 10-325 MG PO TABS
1.0000 | ORAL_TABLET | ORAL | Status: DC | PRN
Start: 2021-02-02 — End: 2021-02-05
  Administered 2021-02-02 – 2021-02-04 (×6): 1 via ORAL
  Filled 2021-02-02 (×8): qty 1

## 2021-02-02 MED ORDER — FUROSEMIDE 10 MG/ML IJ SOLN
60.0000 mg | Freq: Two times a day (BID) | INTRAMUSCULAR | Status: DC
  Administered 2021-02-02 – 2021-02-05 (×6): 60 mg via INTRAVENOUS
  Filled 2021-02-02 (×6): qty 6

## 2021-02-02 MED ORDER — SILVER SULFADIAZINE 1 % EX CREA
TOPICAL_CREAM | Freq: Two times a day (BID) | CUTANEOUS | Status: DC
  Filled 2021-02-02: qty 85

## 2021-02-02 MED ORDER — GADOBUTROL 1 MMOL/ML IV SOLN
10.0000 mL | Freq: Once | INTRAVENOUS | Status: AC | PRN
  Administered 2021-02-02: 10 mL via INTRAVENOUS

## 2021-02-02 NOTE — Evaluation (Addendum)
Physical Therapy Evaluation Patient Details Name: Bernard Ramirez MRN: [id number] DOB: Dec 24, 1930 Today's Date: 02/02/2021   History of Present Illness  Bernard Ramirez  is a 87 y.o. male, with history of prostate cancer, CVA, dementia, GERD, hypertension, low back pain, persistent atrial fibrillation, and more presents ED with a chief complaint of cellulitis.  Patient request that I take history from wife, reporting that he cannot remember anything.  Wife reports that 10 days ago patient had a fall where he hit his leg against something causing a skin tear and laceration.  He was seen in our ED by Dr. Christy Gentles, and the wound was repaired.  Antibiotics were not indicated at that time.  Patient has developed generalized weakness since then.  Wife reports that patient cannot walk without max assist because of pain and weakness.  Wife reports the patient is also had increased swelling in that lower extremity, despite taking 40 mg of Lasix at home.  He has been on 60 mg Lasix in the past, but was recently reduced.  Patient has not had any fevers.  Wife is concerned that patient may have metastasis causing his generalized weakness, as he was diagnosed with stage IV prostate cancer 3.5 years ago and opted not to do treatment.  Wife reports the patient has had a good appetite, drinks plenty of liquid.  She reports that he has chronic urinary and bowel incontinence.  When she is not noted him to have any other complaints.   Clinical Impression  Patient presented in bed with ace wrap and heel cushion on LLE. Patient was noted to have fragile skin. Patient required mod assist for bed mobility for LE movement and sitting balance. Patient requires supervision and mi guard assist for sitting balance at EOB. Patient was able to transfer from sit to stand with mod assist and take 4 steps at the bed side while transferring to the chair. Patient tolerated sitting well and left with legs elevated and pillow placed under his  legs. Patient will benefit from continued physical therapy in hospital and recommended venue below to increase strength, balance, endurance for safe ADLs and gait.     Follow Up Recommendations SNF    Equipment Recommendations  None recommended by PT    Recommendations for Other Services       Precautions / Restrictions Precautions Precautions: Fall Restrictions Weight Bearing Restrictions: No      Mobility  Bed Mobility Overal bed mobility: Needs Assistance Bed Mobility: Supine to Sit     Supine to sit: Mod assist     General bed mobility comments: requires assistance for LE movement and trunk control Patient Response: Cooperative  Transfers Overall transfer level: Needs assistance Equipment used: Rolling walker (2 wheeled) Transfers: Sit to/from [facility] Sit to Stand: Mod assist Stand pivot transfers: Mod assist       General transfer comment: use of RW, verbal and tactile cues for safety  Ambulation/Gait Ambulation/Gait assistance: Mod assist Gait Distance (Feet): 4 Feet Assistive device: Rolling walker (2 wheeled) Gait Pattern/deviations: Decreased step length - right;Decreased step length - left;Decreased stride length Gait velocity: decreased   General Gait Details: Slow labored movement,increase time, verbal and tactile cues, at bedside  Stairs            Wheelchair Mobility    Modified Rankin (Stroke Patients Only)       Balance Overall balance assessment: Needs assistance Sitting-balance support: Bilateral upper extremity supported;Feet supported Sitting balance-Leahy Scale: Poor Sitting balance - Comments:  poor at EOB Postural control: Posterior lean Standing balance support: During functional activity;Bilateral upper extremity supported Standing balance-Leahy Scale: Poor Standing balance comment: with use of RW                             Pertinent Vitals/Pain Pain Assessment: Faces Faces Pain  Scale: Hurts little more Pain Location: Left leg/foot Pain Descriptors / Indicators: Sore;Discomfort Pain Intervention(s): Monitored during session;Repositioned    Home Living Family/patient expects to be discharged to:: Private residence Living Arrangements: Spouse/significant other Available Help at Discharge: Family;Available PRN/intermittently Type of Home: Mobile home Home Access: Stairs to enter Entrance Stairs-Rails: Left;Right;Can reach both Entrance Stairs-Number of Steps: 3 Home Layout: One level Home Equipment: Walker - 2 wheels;Cane - single point;Bedside commode      Prior Function Level of Independence: Needs assistance   Gait / Transfers Assistance Needed: Patient is max/mod assist for transfers with RW           Hand Dominance        Extremity/Trunk Assessment   Upper Extremity Assessment Upper Extremity Assessment: Generalized weakness    Lower Extremity Assessment Lower Extremity Assessment: Generalized weakness    Cervical / Trunk Assessment Cervical / Trunk Assessment: Kyphotic  Communication   Communication: HOH  Cognition Arousal/Alertness: Awake/alert Behavior During Therapy: WFL for tasks assessed/performed Overall Cognitive Status: History of cognitive impairments - at baseline                                        General Comments General comments (skin integrity, edema, etc.): fragile skin, LLE ace wrapped,    Exercises     Assessment/Plan    PT Assessment Patient needs continued PT services  PT Problem List Decreased strength;Decreased activity tolerance;Decreased balance;Decreased mobility;Decreased coordination;Decreased knowledge of precautions;Decreased safety awareness       PT Treatment Interventions DME instruction;Gait training;Therapeutic activities;Therapeutic exercise;Balance training;Patient/family education;Functional mobility training    PT Goals (Current goals can be found in the Care Plan  section)  Acute Rehab PT Goals Patient Stated Goal: return home PT Goal Formulation: With patient/family Time For Goal Achievement: 02/16/21 Potential to Achieve Goals: Fair    Frequency Min 3X/week   Barriers to discharge        Co-evaluation               AM-PAC PT "6 Clicks" Mobility  Outcome Measure Help needed turning from your back to your side while in a flat bed without using bedrails?: A Lot Help needed moving from lying on your back to sitting on the side of a flat bed without using bedrails?: A Lot Help needed moving to and from a bed to a chair (including a wheelchair)?: A Lot Help needed standing up from a chair using your arms (e.g., wheelchair or bedside chair)?: Total Help needed to walk in hospital room?: Total Help needed climbing 3-5 steps with a railing? : Total 6 Click Score: 9    End of Session   Activity Tolerance: Patient tolerated treatment well;Patient limited by fatigue;Patient limited by lethargy;Patient limited by pain Patient left: in chair;with call bell/phone within reach;with bed alarm set;with family/visitor present Nurse Communication: Mobility status PT Visit Diagnosis: Unsteadiness on feet (R26.81);Other abnormalities of gait and mobility (R26.89);Muscle weakness (generalized) (M62.81)    Time: 0539-7673 PT Time Calculation (min) (ACUTE ONLY): 25 min   Charges:  PT Evaluation $PT Eval Moderate Complexity: 1 Mod PT Treatments $Therapeutic Activity: 23-37 mins      2:13 PM, 02/02/21 Joachim Carton Cousler SPT  2:13 PM, 02/02/21 Lonell Grandchild, MPT Physical Therapist with [facility] [phone] office [phone] mobile phone

## 2021-02-02 NOTE — Plan of Care (Addendum)
  Problem: Acute Rehab PT Goals(only PT should resolve) Goal: Pt will Roll Supine to Side Outcome: Progressing Flowsheets (Taken 02/02/2021 1401) Pt will Roll Supine to Side: with min assist Goal: Pt Will Go Supine/Side To Sit Outcome: Progressing Flowsheets (Taken 02/02/2021 1401) Pt will go Supine/Side to Sit:  with minimal assist  with min guard assist Goal: Pt Will Go Sit To Supine/Side Outcome: Progressing Flowsheets (Taken 02/02/2021 1401) Pt will go Sit to Supine/Side: with modified independence Goal: Pt Will Transfer Bed To Chair/Chair To Bed Outcome: Progressing Flowsheets (Taken 02/02/2021 1401) Pt will Transfer Bed to Chair/Chair to Bed: with min assist    2:02 PM, 02/02/21 Jeneen Rinks Cousler SPT  2:10 PM, 02/02/21 Lonell Grandchild, MPT Physical Therapist with [facility] [phone] office [phone] mobile phone

## 2021-02-02 NOTE — Progress Notes (Signed)
Wound care completed per order. Patient tolerated well, requested pain medication and administered per prn orders. Wife at bedside.

## 2021-02-02 NOTE — Consult Note (Signed)
Consultation Note Date: 02/02/2021   Patient Name: Bernard Ramirez  DOB: 02-19-1931  MRN: [id number]  Age / Sex: 87 y.o., male  PCP: [name], MD Referring Physician: Rodena Goldmann, DO  Reason for Consultation: Establishing goals of care  HPI/Patient Profile: 87 y.o. male  with past medical history of stroke, dementia, diastolic heart failure, hypertension, atrial fibrillation, diabetes, prostate cancer, GERD, low back pain admitted on 02/01/2021 with cellulitis. Family have expressed difficulty in caring for him at home with concern for failure to thrive.   Clinical Assessment and Goals of Care: I met today at Bernard Ramirez bedside along with his wife, [name]. He slept throughout my visit but he was present for the entirety. He recently had pain medication and he did look at me when I first arrived but never spoke to me. He appears extremely weak and fatigued. [name] shares that he sleeps all day at home and has become weaker and weaker. [name] is struggling to care for him at home. [name] asks about MRI and potential bone mets for a cause of his decline. We discussed the limitations of this exam and that this does not mean that his cancer is not progressing and adding to his decline. We discussed failure to thrive in the setting of metastatic cancer, heart failure, and poor control of underlying illness with diet and fluid intake. I explained that I did not believe that there would be much we can do to help him to thrive and be much stronger. We can certainly provide wound care but I am not sure that this is going to improve his overall functional status. I worry that he will only continue to get worse.   [name] is understandably tearful but does understand that her husband is very ill and nearing the end of his life. She shares that she does not want him to suffer. She would still like to have rehab attempt. She talks of  long term care and unlikely to be able to make that work. He talked about hospice at home and the likelihood that he will be more bed bound. She is open to hospice in the future.   We also completed MOST: DNR, comfort measures (open to hospice options), determine use/limitation of antibiotics at time of infection, IVF for trial period, will need to consider and discuss option of feeding tube further.   All questions/concerns addressed. Emotional support provided.   Primary Decision Maker NEXT OF KIN wife Audelia Hives (they are legally married but she never changed her last name)    SUMMARY OF RECOMMENDATIONS   - DNR established - MOST complete - Hopeful for rehab attempt but would be eligible for hospice at home is rehab not an option or following rehab  Code Status/Advance Care Planning: DNR   Symptom Management:  Per attending.   Palliative Prophylaxis:  Aspiration, Delirium Protocol, Frequent Pain Assessment, and Turn Reposition  Additional Recommendations (Limitations, Scope, Preferences): Avoid Hospitalization  Psycho-social/Spiritual:  Desire for further Chaplaincy support:yes Additional Recommendations: Caregiving  Support/Resources, Education on Hospice, and Grief/Bereavement Support  Prognosis:  < 6 months very likely  Discharge Planning: Skilled Nursing Facility for rehab with Palliative care service follow-up      Primary Diagnoses: Present on Admission:  Cellulitis  Atrial fibrillation (HCC)  Protein calorie malnutrition (HCC)  Anxiety  Back pain   I have reviewed the medical record, interviewed the patient and family, and examined the patient. The following aspects are pertinent.  Past Medical History:  Diagnosis Date   Anemia    04/2012: H&H-10.2/31.5, MCV-77; 06/2012:12/38.9   Arthritis    Benign prostatic hypertrophy    s/p transurethral resection of the prostate   Chest pain    2004; with dyspnea, stress nuclear-normal EF + questionable  inferior wall ischemia, normal coronary angiography;  2010-negative stress nuclear   CVA (cerebral infarction)    asymptomatic (seen on CT)   Diarrhea    h/o Hemoccult-positive stool   Dysrhythmia    Edema of both legs    GERD (gastroesophageal reflux disease)    History of blood transfusion    History of kidney stones    History of urinary tract infection    Hypertension    Jerking movements of extremities    left arm    Low back pain    Obstructive sleep apnea    pt states can not use the CPAP   Peripheral neuropathy    lower legs and feet bilat    Persistent atrial fibrillation (HCC) 2013   Asymptomatic; diagnosed in 05/2012   Pneumonia    Prostate cancer (HCC) 2017   stage 4   Shortness of breath dyspnea    pt states only develops SOB if tries to do something too quickly   Upper GI bleed 1985   1985-peptic ulcer disease; initial hemigastrectomy; subsequent Billroth II   Urinary hesitancy    Vertigo    ED evaluation-06/2012   Social History   Socioeconomic History   Marital status: Married    Spouse name: [name]   Number of children: 5   Years of education: Not on file   Highest education level: Not on file  Occupational History   Occupation: Retired Advertising copywriter: RETIRED  Tobacco Use   Smoking status: Former    Pack years: 0.00    Types: Cigars    Quit date: 08/22/2003    Years since quitting: 17.4   Smokeless tobacco: Never   Tobacco comments:    Quit many years ago; few cigars per day; didn't inhale  Vaping Use   Vaping Use: Never used  Substance and Sexual Activity   Alcohol use: No    Alcohol/week: 1.0 standard drink    Types: 1 Standard drinks or equivalent per week   Drug use: No   Sexual activity: Not on file  Other Topics Concern   Not on file  Social History Narrative   Lives w/ wife in [place]   Retired truck Hospital doctor   Social Determinants of Corporate investment banker Strain: Not on file  Food Insecurity: Not on file   Transportation Needs: Not on file  Physical Activity: Not on file  Stress: Not on file  Social Connections: Not on file   Family History  Problem Relation Age of Onset   Diabetes Mellitus II Mother    Allergic rhinitis Mother    Lung disease Father    Allergic rhinitis Father    Angioedema Neg Hx    Asthma Neg Hx  Atopy Neg Hx    Eczema Neg Hx    Immunodeficiency Neg Hx    Urticaria Neg Hx    Cystic fibrosis Neg Hx    Lupus Neg Hx    Emphysema Neg Hx    Migraines Neg Hx    Scheduled Meds:  clopidogrel  75 mg Oral Daily   enoxaparin (LOVENOX) injection  60 mg Subcutaneous Q24H   furosemide  40 mg Intravenous Q12H   losartan  100 mg Oral Daily   pantoprazole  40 mg Oral Daily   pravastatin  20 mg Oral q1800   Continuous Infusions:   ceFAZolin (ANCEF) IV 2 g (02/02/21 0057)   PRN Meds:.acetaminophen **OR** acetaminophen, HYDROcodone-acetaminophen, hydrOXYzine, morphine injection, ondansetron **OR** ondansetron (ZOFRAN) IV Allergies  Allergen Reactions   Contrast Media [Iodinated Diagnostic Agents]     Pt states he was given "dye" 20 yrs ago, anaphylaxis & syncope - was told to never have it again    Gabapentin Itching   Aspirin Other (See Comments)    Blistering    Latex Rash   Lipitor [Atorvastatin] Itching   Tape Rash    EKG leads   Review of Systems  Unable to perform ROS: Acuity of condition   Physical Exam Vitals and nursing note reviewed.  Constitutional:      General: He is sleeping. He is not in acute distress.    Appearance: He is ill-appearing.  Cardiovascular:     Rate and Rhythm: Normal rate.  Pulmonary:     Effort: No tachypnea, accessory muscle usage or respiratory distress.  Abdominal:     General: Abdomen is flat.  Skin:    Comments: BLE edema, cellulitis and wound not assessed  Neurological:     Mental Status: He is lethargic.     Comments: Not alert and interactive enough to assess orientation    Vital Signs: BP (!) 108/53 (BP  Location: Left Arm)   Pulse 88   Temp 98 F (36.7 C)   Resp 18   Ht 6' (1.829 m)   Wt 114.6 kg   SpO2 93%   BMI 34.27 kg/m  Pain Scale: 0-10   Pain Score: 10-Worst pain ever   SpO2: SpO2: 93 % O2 Device:SpO2: 93 % O2 Flow Rate: .   IO: Intake/output summary:  Intake/Output Summary (Last 24 hours) at 02/02/2021 0829 Last data filed at 02/02/2021 0600 Gross per 24 hour  Intake 800 ml  Output 1350 ml  Net -550 ml    LBM:   Baseline Weight: Weight: 125.2 kg Most recent weight: Weight: 114.6 kg     Palliative Assessment/Data:     Time In: 0945 Time Out: 1125 Time Total: 100 min Greater than 50%  of this time was spent counseling and coordinating care related to the above assessment and plan.  Signed by: Vinie Sill, NP Palliative Medicine Team Pager # [phone] (M-F 8a-5p) Team Phone # [phone] (Nights/Weekends)

## 2021-02-02 NOTE — TOC Initial Note (Signed)
Transition of Care [facility]) - Initial/Assessment Note    Patient Details  Name: Bernard Ramirez MRN: [id number] Date of Birth: Jan 02, 1931  [facility] CM/SW Contact:    Boneta Lucks, RN Phone Number: 02/02/2021, 2:12 PM  Clinical Narrative:    Patient admitted with cellulitis.  Patient will need possible surgery to debride the wound. PT is recommending SNF. Wife is agreeable. TOC spoke with his wife she is requesting [facility]. PASSR and FL2 completed and sent out for bed offers.  HTA insurance auth started for SNF and EMS.    Expected Discharge Plan: Skilled Nursing Facility Barriers to Discharge: Continued Medical Work up   Patient Goals and CMS Choice Patient states their goals for this hospitalization and ongoing recovery are:: to go to SNF short term. CMS Medicare.gov Compare Post Acute Care list provided to:: Patient Represenative (must comment) Choice offered to / list presented to : Spouse  Expected Discharge Plan and Services Expected Discharge Plan: [place]    Living arrangements for the past 2 months: Single Family Home      Prior Living Arrangements/Services Living arrangements for the past 2 months: Single Family Home Lives with:: Spouse Patient language and need for interpreter reviewed:: Yes        Need for Family Participation in Patient Care: Yes (Comment) Care giver support system in place?: Yes (comment)   Criminal Activity/Legal Involvement Pertinent to Current Situation/Hospitalization: No - Comment as needed  Activities of Daily Living Home Assistive Devices/Equipment: Wheelchair, Environmental consultant (specify type) ADL Screening (condition at time of admission) Patient's cognitive ability adequate to safely complete daily activities?: Yes Is the patient deaf or have difficulty hearing?: No Does the patient have difficulty seeing, even when wearing glasses/contacts?: No Does the patient have difficulty concentrating,  remembering, or making decisions?: No Patient able to express need for assistance with ADLs?: Yes Does the patient have difficulty dressing or bathing?: Yes Independently performs ADLs?: No Feeding: Independent Walks in Home: Needs assistance Is this a change from baseline?: Pre-admission baseline Does the patient have difficulty walking or climbing stairs?: Yes Weakness of Legs: Both Weakness of Arms/Hands: None  Permission Sought/Granted     Share Information with NAME: [name]     Permission granted to share info w Relationship: wife    Emotional Assessment    Orientation: : Oriented to Self, Oriented to Place, Oriented to  Time Alcohol / Substance Use: Not Applicable Psych Involvement: No (comment)  Admission diagnosis:  Cellulitis [L03.90] Cellulitis of left lower extremity [L03.116] Patient Active Problem List   Diagnosis Date Noted   Cellulitis 02/01/2021   Social problem 02/01/2021   Protein calorie malnutrition ([place]) 02/01/2021   Anxiety 02/01/2021   Cellulitis of lower extremity 10/08/2016   Congestive heart failure (CHF) ([place]) 10/08/2016   Acute kidney injury ([place]) 10/08/2016   Acute renal insufficiency due to procedure 04/13/2016   BPH (benign prostatic hypertrophy) with urinary obstruction 04/11/2016   Shortness of breath 09/27/2015   Preop cardiovascular exam 09/27/2015   Melena 05/11/2015   Rectal bleeding 05/11/2015   Abnormal x-ray of abdomen 05/11/2015   Absolute anemia    ED (erectile dysfunction) of organic origin 11/06/2014   Urine stream spraying 11/06/2014   Benign non-nodular prostatic hyperplasia with lower urinary tract symptoms 11/06/2014   Increased frequency of urination 10/12/2014   Post-traumatic bulbous urethral stricture 10/12/2014   Urinary urgency 10/12/2014   Atrial fibrillation by electrocardiogram ([place]) 11/08/2012   Elevated blood pressure 11/08/2012  Former smoker 11/08/2012   Chronic kidney disease, stage II (mild) 07/11/2012    Fasting hyperglycemia 07/11/2012   Chest pain    Vertigo    Atrial fibrillation ([place])    Anemia 06/10/2009   Essential hypertension 06/10/2009   Back pain 06/10/2009   DIARRHEA, CHRONIC 06/10/2009   PEPTIC ULCER, ACUTE, HEMORRHAGE, HX OF 06/10/2009   DIVERTICULITIS, HX OF 06/10/2009   PCP:  [name], MD Pharmacy:   [facility], [address] [place] [address] Phone: [phone] Fax: [phone]   Readmission Risk Interventions Readmission Risk Prevention Plan 02/02/2021  Medication Screening Complete  Transportation Screening Complete  Some recent data might be hidden

## 2021-02-02 NOTE — Progress Notes (Signed)
PROGRESS NOTE    Bernard Ramirez  UMP:[id number] DOB: Feb 02, 1931 DOA: 02/01/2021 PCP: [name], MD   Brief Narrative:   Bernard Ramirez  is a 87 y.o. male, with history of prostate cancer, CVA, dementia, GERD, hypertension, low back pain, persistent atrial fibrillation, and more presents ED with a chief complaint of cellulitis.  He had a fall with trauma to his left lower extremity 10 days ago causing a skin tear that was repaired by EDP with suturing.  He has also had increased swelling of his lower extremities despite taking Lasix at home, but dose was recently reduced.  Assessment & Plan:   Principal Problem:   Cellulitis Active Problems:   Back pain   Atrial fibrillation (HCC)   Social problem   Protein calorie malnutrition (HCC)   Anxiety   Left lower extremity cellulitis in the setting of lower extremity edema with recent skin tear -Appreciate wound care evaluation -Continue on IV Ancef -Diuresis with IV Lasix increased to 60 mg twice daily and check I's and O's -Daily weights  Weakness/debility -PT evaluation with likely need for SNF  Worsening low back pain -Suspect metastatic disease in the setting of known stage IV prostate cancer history -MRI with and without contrast ordered  Protein calorie malnutrition/failure to thrive -Appreciate palliative evaluation for goals of care discussion  Atrial fibrillation -No anticoagulation due to prior GI bleed -Rate controlled, continue to monitor  Anxiety -Continue Atarax   DVT prophylaxis: Lovenox Code Status: Full Family Communication: Discussed with wife at bedside 6/15 Disposition Plan:  Status is: Inpatient  Remains inpatient appropriate because:IV treatments appropriate due to intensity of illness or inability to take PO and Inpatient level of care appropriate due to severity of illness  Dispo: The patient is from: Home              Anticipated d/c is to: SNF              Patient currently is not  medically stable to d/c.   Difficult to place patient No   Consultants:  Wound Care Palliative Care  Procedures:  See below  Antimicrobials:  Anti-infectives (From admission, onward)    Start     Dose/Rate Route Frequency Ordered Stop   02/02/21 0200  ceFAZolin (ANCEF) IVPB 2g/100 mL premix        2 g 200 mL/hr over 30 Minutes Intravenous Every 8 hours 02/01/21 2015     02/01/21 1745  ceFAZolin (ANCEF) IVPB 2g/100 mL premix        2 g 200 mL/hr over 30 Minutes Intravenous  Once 02/01/21 1739 02/01/21 1835       Subjective: Patient seen and evaluated today with no new acute complaints or concerns. No acute concerns or events noted overnight.  Objective: Vitals:   02/01/21 1717 02/01/21 2109 02/02/21 0009 02/02/21 0931  BP: 135/83 (!) 159/91 (!) 108/53 116/63  Pulse: 86 81 88 70  Resp: 18 19 18 16   Temp: 98.4 F (36.9 C) 97.8 F (36.6 C) 98 F (36.7 C) 98.2 F (36.8 C)  TempSrc: Oral     SpO2: 98% 100% 93% 94%  Weight: 125.2 kg 114.6 kg    Height: 6' (1.829 m) 6' (1.829 m)      Intake/Output Summary (Last 24 hours) at 02/02/2021 1151 Last data filed at 02/02/2021 1030 Gross per 24 hour  Intake 1160 ml  Output 2150 ml  Net -990 ml   Filed Weights   02/01/21 1717 02/01/21 2109  Weight: 125.2 kg 114.6 kg    Examination:  General exam: Appears calm and comfortable  Respiratory system: Clear to auscultation. Respiratory effort normal. Cardiovascular system: S1 & S2 heard, RRR.  Gastrointestinal system: Abdomen is soft Central nervous system: Alert and awake Extremities:    Psychiatry: Flat affect.    Data Reviewed: I have personally reviewed following labs and imaging studies  CBC: Recent Labs  Lab 02/01/21 1740 02/02/21 0445  WBC 6.6 6.5  NEUTROABS 4.9 4.4  HGB 10.7* 10.5*  HCT 34.9* 34.1*  MCV 90.9 90.9  PLT 266 665   Basic Metabolic Panel: Recent Labs  Lab 02/01/21 1740 02/02/21 0445  NA 139 138  K 4.3 4.0  CL 106 104  CO2 25 28   GLUCOSE 134* 134*  BUN 13 13  CREATININE 0.85 0.92  CALCIUM 8.8* 8.5*  MG  --  2.1   GFR: Estimated Creatinine Clearance: 72.5 mL/min (by C-G formula based on SCr of 0.92 mg/dL). Liver Function Tests: Recent Labs  Lab 02/01/21 1740 02/02/21 0445  AST 16 14*  ALT 14 11  ALKPHOS 85 77  BILITOT 1.2 0.5  PROT 6.5 6.0*  ALBUMIN 3.2* 2.9*   No results for input(s): LIPASE, AMYLASE in the last 168 hours. No results for input(s): AMMONIA in the last 168 hours. Coagulation Profile: No results for input(s): INR, PROTIME in the last 168 hours. Cardiac Enzymes: No results for input(s): CKTOTAL, CKMB, CKMBINDEX, TROPONINI in the last 168 hours. BNP (last 3 results) No results for input(s): PROBNP in the last 8760 hours. HbA1C: No results for input(s): HGBA1C in the last 72 hours. CBG: No results for input(s): GLUCAP in the last 168 hours. Lipid Profile: No results for input(s): CHOL, HDL, LDLCALC, TRIG, CHOLHDL, LDLDIRECT in the last 72 hours. Thyroid Function Tests: No results for input(s): TSH, T4TOTAL, FREET4, T3FREE, THYROIDAB in the last 72 hours. Anemia Panel: No results for input(s): VITAMINB12, FOLATE, FERRITIN, TIBC, IRON, RETICCTPCT in the last 72 hours. Sepsis Labs: Recent Labs  Lab 02/01/21 1832  LATICACIDVEN 0.9    Recent Results (from the past 240 hour(s))  SARS CORONAVIRUS 2 (TAT 6-24 HRS) Nasopharyngeal Nasopharyngeal Swab     Status: None   Collection Time: 02/01/21  5:28 PM   Specimen: Nasopharyngeal Swab  Result Value Ref Range Status   SARS Coronavirus 2 NEGATIVE NEGATIVE Final    Comment: (NOTE) SARS-CoV-2 target nucleic acids are NOT DETECTED.  The SARS-CoV-2 RNA is generally detectable in upper and lower respiratory specimens during the acute phase of infection. Negative results do not preclude SARS-CoV-2 infection, do not rule out co-infections with other pathogens, and should not be used as the sole basis for treatment or other patient  management decisions. Negative results must be combined with clinical observations, patient history, and epidemiological information. The expected result is Negative.  Fact Sheet for Patients: SugarRoll.be  Fact Sheet for Healthcare Providers: https://www.woods-mathews.com/  This test is not yet approved or cleared by the [place] FDA and  has been authorized for detection and/or diagnosis of SARS-CoV-2 by FDA under an Emergency Use Authorization (EUA). This EUA will remain  in effect (meaning this test can be used) for the duration of the COVID-19 declaration under Se ction 564(b)(1) of the Act, 21 U.S.C. section 360bbb-3(b)(1), unless the authorization is terminated or revoked sooner.  Performed at [facility], [place] [address] [place], [place] [address]          Radiology Studies: DG Tibia/Fibula Left  Result Date: 02/01/2021 CLINICAL DATA:  87 year old male with left lower extremity swelling. EXAM: LEFT FOOT - COMPLETE 3+ VIEW; LEFT TIBIA AND FIBULA - 2 VIEW COMPARISON:  Left foot radiograph dated 01/24/2021. FINDINGS: There is no acute fracture or dislocation. The bones are osteopenic. There is arthritic changes of the left knee with moderate narrowing of the medial compartment. There is a small suprapatellar effusion. There is diffuse subcutaneous edema and worsened soft tissue swelling of the forefoot. No radiopaque foreign object or soft tissue gas. IMPRESSION: 1. No acute fracture or dislocation. 2. Osteopenia. 3. Diffuse subcutaneous edema and worsened soft tissue swelling of the forefoot. Electronically Signed   By: Anner Crete M.D.   On: 02/01/2021 19:19   DG Foot Complete Left  Result Date: 02/01/2021 CLINICAL DATA:  87 year old male with left lower extremity swelling. EXAM: LEFT FOOT - COMPLETE 3+ VIEW; LEFT TIBIA AND FIBULA - 2 VIEW COMPARISON:  Left foot radiograph dated 01/24/2021. FINDINGS: There is no acute  fracture or dislocation. The bones are osteopenic. There is arthritic changes of the left knee with moderate narrowing of the medial compartment. There is a small suprapatellar effusion. There is diffuse subcutaneous edema and worsened soft tissue swelling of the forefoot. No radiopaque foreign object or soft tissue gas. IMPRESSION: 1. No acute fracture or dislocation. 2. Osteopenia. 3. Diffuse subcutaneous edema and worsened soft tissue swelling of the forefoot. Electronically Signed   By: Anner Crete M.D.   On: 02/01/2021 19:19        Scheduled Meds:  clopidogrel  75 mg Oral Daily   enoxaparin (LOVENOX) injection  60 mg Subcutaneous Q24H   furosemide  40 mg Intravenous Q12H   losartan  100 mg Oral Daily   pantoprazole  40 mg Oral Daily   pravastatin  20 mg Oral q1800   silver sulfADIAZINE   Topical BID   Continuous Infusions:   ceFAZolin (ANCEF) IV 2 g (02/02/21 0856)     LOS: 1 day    Time spent: 35 minutes    Larraine Argo Darleen Crocker, DO [facility]  If 7PM-7AM, please contact night-coverage [email or web address] 02/02/2021, 11:51 AM

## 2021-02-02 NOTE — Consult Note (Signed)
WOC Nurse Consult Note: Patient receiving care in [facility]. Consult completed after review of record and images. Reason for Consult: LLE wound Wound type: trauma injury from a fall that occurred on 01/23/21, and was repaired in the ED. Now with some skin necrosis and cellulitis of the leg. Pressure Injury POA: Yes/No/NA Measurement: Wound bed: see photo Drainage (amount, consistency, odor) non-malodorous per MD note. Periwound: edematous, erythematous Dressing procedure/placement/frequency: Apply Silvadene cream to LLE wound AFTER cleansing with saline solution, dab dry. Apply Silvadene. Cover wound area with as many Mepitel Kellie Simmering [phone]) as needed, then ABD pad(s). Beginning behind the toes and going to just below the knee, spiral wrap kerlex, then 4 inch Ace wrap Kellie Simmering [phone]). Place foot in Prevalon boot Kellie Simmering [phone]). To be performed each shift.  IF there is any medical concern for underlying issues requiring a surgical consult, please consult surgery for their input.  Monitor the wound area(s) for worsening of condition such as: Signs/symptoms of infection,  Increase in size,  Development of or worsening of odor, Development of pain, or increased pain at the affected locations.  Notify the medical team if any of these develop.  Thank you for the consult. [facility] nurse will not follow at this time.  Please re-consult the [facility] team if needed.  Val Riles, RN, MSN, CWOCN, CNS-BC, pager [phone]

## 2021-02-03 DIAGNOSIS — Z7189 Other specified counseling: Secondary | ICD-10-CM

## 2021-02-03 DIAGNOSIS — R627 Adult failure to thrive: Secondary | ICD-10-CM

## 2021-02-03 DIAGNOSIS — Z789 Other specified health status: Secondary | ICD-10-CM

## 2021-02-03 DIAGNOSIS — Z515 Encounter for palliative care: Secondary | ICD-10-CM

## 2021-02-03 LAB — BASIC METABOLIC PANEL
Anion gap: 7 (ref 5–15)
BUN: 12 mg/dL (ref 8–23)
CO2: 31 mmol/L (ref 22–32)
Calcium: 8.6 mg/dL — ABNORMAL LOW (ref 8.9–10.3)
Chloride: 98 mmol/L (ref 98–111)
Creatinine, Ser: 0.97 mg/dL (ref 0.61–1.24)
GFR, Estimated: >60 mL/min (ref >60)
Glucose, Bld: 124 mg/dL — ABNORMAL HIGH (ref 70–99)
Potassium: 3.8 mmol/L (ref 3.5–5.1)
Sodium: 136 mmol/L (ref 135–145)

## 2021-02-03 LAB — CBC
HCT: 34.8 % — ABNORMAL LOW (ref 39.0–52.0)
Hemoglobin: 10.9 g/dL — ABNORMAL LOW (ref 13.0–17.0)
MCH: 28.1 pg (ref 26.0–34.0)
MCHC: 31.3 g/dL (ref 30.0–36.0)
MCV: 89.7 fL (ref 80.0–100.0)
Platelets: 265 10*3/uL (ref 150–400)
RBC: 3.88 MIL/uL — ABNORMAL LOW (ref 4.22–5.81)
RDW: 14.2 % (ref 11.5–15.5)
WBC: 6.6 10*3/uL (ref 4.0–10.5)
nRBC: 0 % (ref 0.0–0.2)

## 2021-02-03 LAB — MAGNESIUM: Magnesium: 2 mg/dL (ref 1.7–2.4)

## 2021-02-03 LAB — PROCALCITONIN: Procalcitonin: <0.1 ng/mL

## 2021-02-03 NOTE — TOC Progression Note (Signed)
Transition of Care [facility]) - Progression Note    Patient Details  Name: Bernard Ramirez MRN: [id number] Date of Birth: 04-24-1931  [facility]) CM/SW Contact  Boneta Lucks, RN Phone Number: 02/03/2021, 10:33 AM  Clinical Narrative:   [facility] center offered, wife accepted. HTA working on [employer], plan for weekend discharge to [facility].   Expected Discharge Plan: Skilled Nursing Facility Barriers to Discharge: Continued Medical Work up  Expected Discharge Plan and Services Expected Discharge Plan: [place] arrangements for the past 2 months: [facility]

## 2021-02-03 NOTE — Progress Notes (Signed)
Patient's left leg unwrapped and cleansed according to wound care order.  Large TED hose placed on right leg per order.

## 2021-02-03 NOTE — Progress Notes (Signed)
PROGRESS NOTE    Bernard Ramirez  NOB:[id number] DOB: 03/22/1931 DOA: 02/01/2021 PCP: [name], MD   Brief Narrative:   Bernard Ramirez  is a 87 y.o. male, with history of prostate cancer, CVA, dementia, GERD, hypertension, low back pain, persistent atrial fibrillation, and more presents ED with a chief complaint of cellulitis.  He had a fall with trauma to his left lower extremity 10 days ago causing a skin tear that was repaired by EDP with suturing.  He has also had increased swelling of his lower extremities despite taking Lasix at home, but dose was recently reduced.  Assessment & Plan:   Principal Problem:   Cellulitis Active Problems:   Back pain   Atrial fibrillation (HCC)   Social problem   Protein calorie malnutrition (HCC)   Anxiety   Left lower extremity cellulitis in the setting of lower extremity edema with recent skin tear -Appreciate wound care evaluation -Continue on IV Ancef -Diuresis with IV Lasix increased to 60 mg twice daily and check I's and O's -Daily weights   Weakness/debility -PT evaluation with likely need for SNF   Worsening low back pain -Suspect metastatic disease in the setting of known stage IV prostate cancer history -MRI with and without contrast without significant findings aside from some left foraminal stenosis   Protein calorie malnutrition/failure to thrive -Appreciate palliative evaluation for goals of care discussion   Atrial fibrillation -No anticoagulation due to prior GI bleed -Rate controlled, continue to monitor   Anxiety -Continue Atarax     DVT prophylaxis: Lovenox Code Status: DNR Family Communication: Discussed with wife at bedside 6/16 Disposition Plan:  Status is: Inpatient   Remains inpatient appropriate because:IV treatments appropriate due to intensity of illness or inability to take PO and Inpatient level of care appropriate due to severity of illness   Dispo: The patient is from: Home               Anticipated d/c is to: SNF              Patient currently is not medically stable to d/c.              Difficult to place patient No     Consultants:  Wound Care Palliative Care   Procedures:  See below  Antimicrobials:  Anti-infectives (From admission, onward)    Start     Dose/Rate Route Frequency Ordered Stop   02/02/21 0200  ceFAZolin (ANCEF) IVPB 2g/100 mL premix        2 g 200 mL/hr over 30 Minutes Intravenous Every 8 hours 02/01/21 2015     02/01/21 1745  ceFAZolin (ANCEF) IVPB 2g/100 mL premix        2 g 200 mL/hr over 30 Minutes Intravenous  Once 02/01/21 1739 02/01/21 1835       Subjective: Patient seen and evaluated today with no new acute complaints or concerns. No acute concerns or events noted overnight.  Objective: Vitals:   02/02/21 1344 02/02/21 2032 02/03/21 0431 02/03/21 0500  BP: 102/76 139/68 107/60   Pulse: 85 84 66   Resp: 16 19 19    Temp: 99.3 F (37.4 C) 98.2 F (36.8 C) 98.3 F (36.8 C)   TempSrc: Oral     SpO2: 93% 96% 95%   Weight:    114.6 kg  Height:        Intake/Output Summary (Last 24 hours) at 02/03/2021 1205 Last data filed at 02/02/2021 2300 Gross per 24 hour  Intake 703.68 ml  Output 2700 ml  Net -1996.32 ml   Filed Weights   02/01/21 1717 02/01/21 2109 02/03/21 0500  Weight: 125.2 kg 114.6 kg 114.6 kg    Examination:  General exam: Appears calm and comfortable  Respiratory system: Clear to auscultation. Respiratory effort normal. Cardiovascular system: S1 & S2 heard, RRR.  Gastrointestinal system: Abdomen is soft Central nervous system: Alert and awake Extremities: Ongoing 2+ pitting edema Skin: Ongoing erythema to left lower extremity Psychiatry: Flat affect.    Data Reviewed: I have personally reviewed following labs and imaging studies  CBC: Recent Labs  Lab 02/01/21 1740 02/02/21 0445 02/03/21 0551  WBC 6.6 6.5 6.6  NEUTROABS 4.9 4.4  --   HGB 10.7* 10.5* 10.9*  HCT 34.9* 34.1* 34.8*  MCV 90.9  90.9 89.7  PLT 266 252 595   Basic Metabolic Panel: Recent Labs  Lab 02/01/21 1740 02/02/21 0445 02/03/21 0551  NA 139 138 136  K 4.3 4.0 3.8  CL 106 104 98  CO2 25 28 31   GLUCOSE 134* 134* 124*  BUN 13 13 12   CREATININE 0.85 0.92 0.97  CALCIUM 8.8* 8.5* 8.6*  MG  --  2.1 2.0   GFR: Estimated Creatinine Clearance: 68.8 mL/min (by C-G formula based on SCr of 0.97 mg/dL). Liver Function Tests: Recent Labs  Lab 02/01/21 1740 02/02/21 0445  AST 16 14*  ALT 14 11  ALKPHOS 85 77  BILITOT 1.2 0.5  PROT 6.5 6.0*  ALBUMIN 3.2* 2.9*   No results for input(s): LIPASE, AMYLASE in the last 168 hours. No results for input(s): AMMONIA in the last 168 hours. Coagulation Profile: No results for input(s): INR, PROTIME in the last 168 hours. Cardiac Enzymes: No results for input(s): CKTOTAL, CKMB, CKMBINDEX, TROPONINI in the last 168 hours. BNP (last 3 results) No results for input(s): PROBNP in the last 8760 hours. HbA1C: No results for input(s): HGBA1C in the last 72 hours. CBG: No results for input(s): GLUCAP in the last 168 hours. Lipid Profile: No results for input(s): CHOL, HDL, LDLCALC, TRIG, CHOLHDL, LDLDIRECT in the last 72 hours. Thyroid Function Tests: No results for input(s): TSH, T4TOTAL, FREET4, T3FREE, THYROIDAB in the last 72 hours. Anemia Panel: No results for input(s): VITAMINB12, FOLATE, FERRITIN, TIBC, IRON, RETICCTPCT in the last 72 hours. Sepsis Labs: Recent Labs  Lab 02/01/21 1832 02/03/21 0551  PROCALCITON  --  <0.10  LATICACIDVEN 0.9  --     Recent Results (from the past 240 hour(s))  SARS CORONAVIRUS 2 (TAT 6-24 HRS) Nasopharyngeal Nasopharyngeal Swab     Status: None   Collection Time: 02/01/21  5:28 PM   Specimen: Nasopharyngeal Swab  Result Value Ref Range Status   SARS Coronavirus 2 NEGATIVE NEGATIVE Final    Comment: (NOTE) SARS-CoV-2 target nucleic acids are NOT DETECTED.  The SARS-CoV-2 RNA is generally detectable in upper and  lower respiratory specimens during the acute phase of infection. Negative results do not preclude SARS-CoV-2 infection, do not rule out co-infections with other pathogens, and should not be used as the sole basis for treatment or other patient management decisions. Negative results must be combined with clinical observations, patient history, and epidemiological information. The expected result is Negative.  Fact Sheet for Patients: SugarRoll.be  Fact Sheet for Healthcare Providers: https://www.woods-mathews.com/  This test is not yet approved or cleared by the [place] FDA and  has been authorized for detection and/or diagnosis of SARS-CoV-2 by FDA under an Emergency Use Authorization (EUA). This EUA  will remain  in effect (meaning this test can be used) for the duration of the COVID-19 declaration under Se ction 564(b)(1) of the Act, 21 U.S.C. section 360bbb-3(b)(1), unless the authorization is terminated or revoked sooner.  Performed at [facility], [place] [address], [place],  [address]          Radiology Studies: DG Tibia/Fibula Left  Result Date: 02/01/2021 CLINICAL DATA:  87 year old male with left lower extremity swelling. EXAM: LEFT FOOT - COMPLETE 3+ VIEW; LEFT TIBIA AND FIBULA - 2 VIEW COMPARISON:  Left foot radiograph dated 01/24/2021. FINDINGS: There is no acute fracture or dislocation. The bones are osteopenic. There is arthritic changes of the left knee with moderate narrowing of the medial compartment. There is a small suprapatellar effusion. There is diffuse subcutaneous edema and worsened soft tissue swelling of the forefoot. No radiopaque foreign object or soft tissue gas. IMPRESSION: 1. No acute fracture or dislocation. 2. Osteopenia. 3. Diffuse subcutaneous edema and worsened soft tissue swelling of the forefoot. Electronically Signed   By: Anner Crete M.D.   On: 02/01/2021 19:19   MR Lumbar Spine W  Wo Contrast  Result Date: 02/02/2021 CLINICAL DATA:  History of prostate cancer. Question osseous metastatic disease. EXAM: MRI LUMBAR SPINE WITHOUT AND WITH CONTRAST TECHNIQUE: Multiplanar and multiecho pulse sequences of the lumbar spine were obtained without and with intravenous contrast. CONTRAST:  28mL GADAVIST GADOBUTROL 1 MMOL/ML IV SOLN COMPARISON:  07/09/2013 FINDINGS: Segmentation:  5 lumbar type vertebral bodies. Alignment: Mild scoliotic curvature convex to the right. No antero or retrolisthesis. Vertebrae: No evidence of osseous metastatic disease within the region. Conus medullaris and cauda equina: Conus extends to the L1 level. Conus and cauda equina appear normal. Paraspinal and other soft tissues: Negative Disc levels: T12-L1: Endplate osteophytes and bulging of the disc. Facet and ligamentous hypertrophy. Mild stenosis of both lateral recesses but without visible neural compression. L1-2: Endplate osteophytes and bulging of the disc. Mild facet and ligamentous hypertrophy. Mild stenosis of both lateral recesses but no apparent neural compression. L2-3: Endplate osteophytes and bulging of the disc. Facet and ligamentous hypertrophy. Narrowing of the lateral recesses and foramina, left more than right. Some potential for neural compression in the left lateral recess. L3-4: Endplate osteophytes and bulging of the disc. Mild facet and ligamentous hypertrophy. Mild stenosis of the left lateral recess and intervertebral foramen but without likely neural compression. L4-5: Chronic fusion across the disc space. Wide patency of the canal and foramina. L5-S1: Endplate osteophytes and bulging of the disc. Bilateral facet osteoarthritis. No compressive canal or foraminal stenosis. IMPRESSION: No evidence of regional metastatic disease. This study covers from mid T11 to mid S3. Chronic curvature and degenerative changes of the spine, only slightly progressive since 2014. No definite neural compression.  Lateral recess narrowing with some possible potential for neural compression or irritation on the left at L2-3 and less so at L3-4. Electronically Signed   By: Nelson Chimes M.D.   On: 02/02/2021 15:30   DG Foot Complete Left  Result Date: 02/01/2021 CLINICAL DATA:  87 year old male with left lower extremity swelling. EXAM: LEFT FOOT - COMPLETE 3+ VIEW; LEFT TIBIA AND FIBULA - 2 VIEW COMPARISON:  Left foot radiograph dated 01/24/2021. FINDINGS: There is no acute fracture or dislocation. The bones are osteopenic. There is arthritic changes of the left knee with moderate narrowing of the medial compartment. There is a small suprapatellar effusion. There is diffuse subcutaneous edema and worsened soft tissue swelling of the  forefoot. No radiopaque foreign object or soft tissue gas. IMPRESSION: 1. No acute fracture or dislocation. 2. Osteopenia. 3. Diffuse subcutaneous edema and worsened soft tissue swelling of the forefoot. Electronically Signed   By: Anner Crete M.D.   On: 02/01/2021 19:19        Scheduled Meds:  clopidogrel  75 mg Oral Daily   enoxaparin (LOVENOX) injection  60 mg Subcutaneous Q24H   furosemide  60 mg Intravenous Q12H   pantoprazole  40 mg Oral Daily   pravastatin  20 mg Oral q1800   silver sulfADIAZINE   Topical BID   Continuous Infusions:   ceFAZolin (ANCEF) IV 2 g (02/03/21 0936)     LOS: 2 days    Time spent: 35 minutes    Etienne Millward Darleen Crocker, DO [facility]  If 7PM-7AM, please contact night-coverage [email or web address] 02/03/2021, 12:05 PM

## 2021-02-04 LAB — BASIC METABOLIC PANEL
Anion gap: 9 (ref 5–15)
BUN: 14 mg/dL (ref 8–23)
CO2: 33 mmol/L — ABNORMAL HIGH (ref 22–32)
Calcium: 8.8 mg/dL — ABNORMAL LOW (ref 8.9–10.3)
Chloride: 95 mmol/L — ABNORMAL LOW (ref 98–111)
Creatinine, Ser: 1.08 mg/dL (ref 0.61–1.24)
GFR, Estimated: >60 mL/min (ref >60)
Glucose, Bld: 122 mg/dL — ABNORMAL HIGH (ref 70–99)
Potassium: 4.2 mmol/L (ref 3.5–5.1)
Sodium: 137 mmol/L (ref 135–145)

## 2021-02-04 LAB — CBC
HCT: 37.6 % — ABNORMAL LOW (ref 39.0–52.0)
Hemoglobin: 11.5 g/dL — ABNORMAL LOW (ref 13.0–17.0)
MCH: 27.6 pg (ref 26.0–34.0)
MCHC: 30.6 g/dL (ref 30.0–36.0)
MCV: 90.2 fL (ref 80.0–100.0)
Platelets: 283 10*3/uL (ref 150–400)
RBC: 4.17 MIL/uL — ABNORMAL LOW (ref 4.22–5.81)
RDW: 14.2 % (ref 11.5–15.5)
WBC: 8.7 10*3/uL (ref 4.0–10.5)
nRBC: 0 % (ref 0.0–0.2)

## 2021-02-04 LAB — MAGNESIUM: Magnesium: 2.1 mg/dL (ref 1.7–2.4)

## 2021-02-04 MED ORDER — DIPHENHYDRAMINE-ZINC ACETATE 2-0.1 % EX CREA
TOPICAL_CREAM | Freq: Two times a day (BID) | CUTANEOUS | Status: DC | PRN
  Filled 2021-02-04: qty 28

## 2021-02-04 NOTE — [place] FL2 (Signed)
[place] MEDICAID FL2 LEVEL OF CARE SCREENING TOOL     IDENTIFICATION  Patient Name: Bernard Ramirez Birthdate: 09/03/1930 Sex: male Admission Date (Current Location): 02/01/2021  [facility] and [place] Number:  [employer] and Address:  [facility] [address] [place]      Provider Number:    Attending Physician Name and Address:  Rodena Goldmann, DO  Relative Name and Phone Number:  Jene Oravec - wife [phone]    Current Level of Care: Hospital Recommended Level of Care: [place] Prior Approval Number:    Date Approved/Denied:   PASRR Number:    Discharge Plan: SNF    Current Diagnoses: Patient Active Problem List   Diagnosis Date Noted   Failure to thrive in adult    Cellulitis 02/01/2021   Social problem 02/01/2021   Protein calorie malnutrition ([place]) 02/01/2021   Anxiety 02/01/2021   Cellulitis of lower extremity 10/08/2016   Congestive heart failure (CHF) ([place]) 10/08/2016   Acute kidney injury ([place]) 10/08/2016   Acute renal insufficiency due to procedure 04/13/2016   BPH (benign prostatic hypertrophy) with urinary obstruction 04/11/2016   Shortness of breath 09/27/2015   Preop cardiovascular exam 09/27/2015   Melena 05/11/2015   Rectal bleeding 05/11/2015   Abnormal x-ray of abdomen 05/11/2015   Absolute anemia    ED (erectile dysfunction) of organic origin 11/06/2014   Urine stream spraying 11/06/2014   Benign non-nodular prostatic hyperplasia with lower urinary tract symptoms 11/06/2014   Increased frequency of urination 10/12/2014   Post-traumatic bulbous urethral stricture 10/12/2014   Urinary urgency 10/12/2014   Atrial fibrillation by electrocardiogram ([place]) 11/08/2012   Elevated blood pressure 11/08/2012   Former smoker 11/08/2012   Chronic kidney disease, stage II (mild) 07/11/2012   Fasting hyperglycemia 07/11/2012   Chest pain    Vertigo    Atrial fibrillation ([place])     Anemia 06/10/2009   Essential hypertension 06/10/2009   Back pain 06/10/2009   DIARRHEA, CHRONIC 06/10/2009   PEPTIC ULCER, ACUTE, HEMORRHAGE, HX OF 06/10/2009   DIVERTICULITIS, HX OF 06/10/2009    Orientation RESPIRATION BLADDER Height & Weight     Self, Time, Situation, Place  Normal External catheter Weight: 114.6 kg Height:  6' (182.9 cm)  BEHAVIORAL SYMPTOMS/MOOD NEUROLOGICAL BOWEL NUTRITION STATUS      Continent Diet (DC summary)  AMBULATORY STATUS COMMUNICATION OF NEEDS Skin   Extensive Assist Verbally Other (Comment) (cellulitis - leg)                       Personal Care Assistance Level of Assistance  Bathing, Feeding, Dressing Bathing Assistance: Maximum assistance Feeding assistance: Limited assistance Dressing Assistance: Maximum assistance     Functional Limitations Info  Sight, Hearing, Speech Sight Info: Adequate Hearing Info: Adequate Speech Info: Adequate    SPECIAL CARE FACTORS FREQUENCY  PT (By licensed PT)     PT Frequency: 5 times week              Contractures Contractures Info: Not present    Additional Factors Info  Code Status, Allergies Code Status Info: DNR Allergies Info: Contrast media, Gabapentin, Aspirin, Latex, Lipitor, Tape           Current Medications (02/04/2021):  This is the current hospital active medication list Current Facility-Administered Medications  Medication Dose Route Frequency Provider Last Rate Last Admin   acetaminophen (TYLENOL) tablet 650 mg  650 mg Oral Q6H PRN Zierle-Ghosh, [place]  B, DO   650 mg at 02/03/21 2751   Or   acetaminophen (TYLENOL) suppository 650 mg  650 mg Rectal Q6H PRN Zierle-Ghosh, Asia B, DO       ceFAZolin (ANCEF) IVPB 2g/100 mL premix  2 g Intravenous Q8H Zierle-Ghosh, Asia B, DO 200 mL/hr at 02/04/21 0941 2 g at 02/04/21 0941   clopidogrel (PLAVIX) tablet 75 mg  75 mg Oral Daily Zierle-Ghosh, Asia B, DO   75 mg at 02/04/21 0929   diphenhydrAMINE-zinc acetate (BENADRYL) 2-0.1 %  cream   Topical BID PRN Heath Lark D, DO   Given at 02/04/21 0944   enoxaparin (LOVENOX) injection 60 mg  60 mg Subcutaneous Q24H Zierle-Ghosh, Asia B, DO   60 mg at 02/03/21 2233   furosemide (LASIX) injection 60 mg  60 mg Intravenous Q12H Shah, Pratik D, DO   60 mg at 02/04/21 0931   HYDROcodone-acetaminophen (NORCO) 10-325 MG per tablet 1 tablet  1 tablet Oral Q4H PRN Manuella Ghazi, Pratik D, DO   1 tablet at 02/04/21 1211   hydrOXYzine (ATARAX/VISTARIL) tablet 50 mg  50 mg Oral Q6H PRN Zierle-Ghosh, Asia B, DO   50 mg at 02/04/21 1211   morphine 2 MG/ML injection 2 mg  2 mg Intravenous Q2H PRN Zierle-Ghosh, Asia B, DO       ondansetron (ZOFRAN) tablet 4 mg  4 mg Oral Q6H PRN Zierle-Ghosh, Asia B, DO       Or   ondansetron (ZOFRAN) injection 4 mg  4 mg Intravenous Q6H PRN Zierle-Ghosh, Asia B, DO       pantoprazole (PROTONIX) EC tablet 40 mg  40 mg Oral Daily Zierle-Ghosh, Asia B, DO   40 mg at 02/04/21 0929   pravastatin (PRAVACHOL) tablet 20 mg  20 mg Oral q1800 Zierle-Ghosh, Asia B, DO   20 mg at 02/03/21 1731   silver sulfADIAZINE (SILVADENE) 1 % cream   Topical BID Heath Lark D, DO   Given at 02/04/21 1211     Discharge Medications: Please see discharge summary for a list of discharge medications.  Relevant Imaging Results:  Relevant Lab Results:   Additional Information SS# [id number]  Boneta Lucks, RN

## 2021-02-04 NOTE — Progress Notes (Addendum)
PROGRESS NOTE    Bernard Ramirez  OVZ:[id number] DOB: 12/04/1930 DOA: 02/01/2021 PCP: [name], MD   Brief Narrative:   Bernard Ramirez  is a 87 y.o. male, with history of prostate cancer, CVA, dementia, GERD, hypertension, low back pain, persistent atrial fibrillation, and more presents ED with a chief complaint of cellulitis.  He had a fall with trauma to his left lower extremity 10 days ago causing a skin tear that was repaired by EDP with suturing.  He has also had increased swelling of his lower extremities despite taking Lasix at home, but dose was recently reduced.  Assessment & Plan:   Principal Problem:   Cellulitis Active Problems:   Back pain   Atrial fibrillation (HCC)   Social problem   Protein calorie malnutrition (HCC)   Anxiety   Failure to thrive in adult   Left lower extremity cellulitis in the setting of lower extremity edema with recent skin tear -Appreciate wound care evaluation -Continue on IV Ancef -Diuresis with IV Lasix 60 mg twice daily and check I's and O's, with significant ongoing UO and volume overload noted -Daily weights   Weakness/debility -PT evaluation with likely need for SNF   Worsening low back pain-stable -Suspect metastatic disease in the setting of known stage IV prostate cancer history -MRI with and without contrast without significant findings aside from some left foraminal stenosis   Protein calorie malnutrition/failure to thrive -Appreciate palliative evaluation for goals of care discussion   Atrial fibrillation -No anticoagulation due to prior GI bleed -Rate controlled, continue to monitor   Anxiety -Continue Atarax     DVT prophylaxis: Lovenox Code Status: DNR Family Communication: Discussed with wife at bedside 6/17 Disposition Plan:  Status is: Inpatient   Remains inpatient appropriate because:IV treatments appropriate due to intensity of illness or inability to take PO and Inpatient level of care  appropriate due to severity of illness   Dispo: The patient is from: Home              Anticipated d/c is to: SNF              Patient currently is not medically stable to d/c.              Difficult to place patient No     Consultants:  Wound Care Palliative Care   Procedures:  See below  Antimicrobials:  Anti-infectives (From admission, onward)    Start     Dose/Rate Route Frequency Ordered Stop   02/02/21 0200  ceFAZolin (ANCEF) IVPB 2g/100 mL premix        2 g 200 mL/hr over 30 Minutes Intravenous Every 8 hours 02/01/21 2015     02/01/21 1745  ceFAZolin (ANCEF) IVPB 2g/100 mL premix        2 g 200 mL/hr over 30 Minutes Intravenous  Once 02/01/21 1739 02/01/21 1835       Subjective: Patient seen and evaluated today with no new acute complaints or concerns. No acute concerns or events noted overnight.  Objective: Vitals:   02/03/21 1330 02/03/21 2014 02/04/21 0402 02/04/21 0500  BP: (!) 99/55 121/75 137/87   Pulse: 63 72 80   Resp: 16 19 19    Temp: 97.6 F (36.4 C) 97.6 F (36.4 C) 98.1 F (36.7 C)   TempSrc: Oral     SpO2: 93% 97% 95%   Weight:    114.6 kg  Height:        Intake/Output Summary (Last 24 hours)  at 02/04/2021 1111 Last data filed at 02/04/2021 0900 Gross per 24 hour  Intake 720 ml  Output 4175 ml  Net -3455 ml   Filed Weights   02/01/21 2109 02/03/21 0500 02/04/21 0500  Weight: 114.6 kg 114.6 kg 114.6 kg    Examination:  General exam: Appears calm and comfortable  Respiratory system: Clear to auscultation. Respiratory effort normal. Cardiovascular system: S1 & S2 heard, RRR.  Gastrointestinal system: Abdomen is soft Central nervous system: Alert and awake Extremities: Edema 1+ bilaterally with dressings over LLE Skin: No significant lesions noted Psychiatry: Flat affect.    Data Reviewed: I have personally reviewed following labs and imaging studies  CBC: Recent Labs  Lab 02/01/21 1740 02/02/21 0445 02/03/21 0551  02/04/21 0647  WBC 6.6 6.5 6.6 8.7  NEUTROABS 4.9 4.4  --   --   HGB 10.7* 10.5* 10.9* 11.5*  HCT 34.9* 34.1* 34.8* 37.6*  MCV 90.9 90.9 89.7 90.2  PLT 266 252 265 702   Basic Metabolic Panel: Recent Labs  Lab 02/01/21 1740 02/02/21 0445 02/03/21 0551 02/04/21 0647  NA 139 138 136 137  K 4.3 4.0 3.8 4.2  CL 106 104 98 95*  CO2 25 28 31  33*  GLUCOSE 134* 134* 124* 122*  BUN 13 13 12 14   CREATININE 0.85 0.92 0.97 1.08  CALCIUM 8.8* 8.5* 8.6* 8.8*  MG  --  2.1 2.0 2.1   GFR: Estimated Creatinine Clearance: 61.8 mL/min (by C-G formula based on SCr of 1.08 mg/dL). Liver Function Tests: Recent Labs  Lab 02/01/21 1740 02/02/21 0445  AST 16 14*  ALT 14 11  ALKPHOS 85 77  BILITOT 1.2 0.5  PROT 6.5 6.0*  ALBUMIN 3.2* 2.9*   No results for input(s): LIPASE, AMYLASE in the last 168 hours. No results for input(s): AMMONIA in the last 168 hours. Coagulation Profile: No results for input(s): INR, PROTIME in the last 168 hours. Cardiac Enzymes: No results for input(s): CKTOTAL, CKMB, CKMBINDEX, TROPONINI in the last 168 hours. BNP (last 3 results) No results for input(s): PROBNP in the last 8760 hours. HbA1C: No results for input(s): HGBA1C in the last 72 hours. CBG: No results for input(s): GLUCAP in the last 168 hours. Lipid Profile: No results for input(s): CHOL, HDL, LDLCALC, TRIG, CHOLHDL, LDLDIRECT in the last 72 hours. Thyroid Function Tests: No results for input(s): TSH, T4TOTAL, FREET4, T3FREE, THYROIDAB in the last 72 hours. Anemia Panel: No results for input(s): VITAMINB12, FOLATE, FERRITIN, TIBC, IRON, RETICCTPCT in the last 72 hours. Sepsis Labs: Recent Labs  Lab 02/01/21 1832 02/03/21 0551  PROCALCITON  --  <0.10  LATICACIDVEN 0.9  --     Recent Results (from the past 240 hour(s))  SARS CORONAVIRUS 2 (TAT 6-24 HRS) Nasopharyngeal Nasopharyngeal Swab     Status: None   Collection Time: 02/01/21  5:28 PM   Specimen: Nasopharyngeal Swab  Result Value  Ref Range Status   SARS Coronavirus 2 NEGATIVE NEGATIVE Final    Comment: (NOTE) SARS-CoV-2 target nucleic acids are NOT DETECTED.  The SARS-CoV-2 RNA is generally detectable in upper and lower respiratory specimens during the acute phase of infection. Negative results do not preclude SARS-CoV-2 infection, do not rule out co-infections with other pathogens, and should not be used as the sole basis for treatment or other patient management decisions. Negative results must be combined with clinical observations, patient history, and epidemiological information. The expected result is Negative.  Fact Sheet for Patients: SugarRoll.be  Fact Sheet for Healthcare Providers:  https://www.woods-mathews.com/  This test is not yet approved or cleared by the [place] and  has been authorized for detection and/or diagnosis of SARS-CoV-2 by FDA under an Emergency Use Authorization (EUA). This EUA will remain  in effect (meaning this test can be used) for the duration of the COVID-19 declaration under Se ction 564(b)(1) of the Act, 21 U.S.C. section 360bbb-3(b)(1), unless the authorization is terminated or revoked sooner.  Performed at [facility], [place] [address] [place], [place] [address]          Radiology Studies: MR Lumbar Spine W Wo Contrast  Result Date: 02/02/2021 CLINICAL DATA:  History of prostate cancer. Question osseous metastatic disease. EXAM: MRI LUMBAR SPINE WITHOUT AND WITH CONTRAST TECHNIQUE: Multiplanar and multiecho pulse sequences of the lumbar spine were obtained without and with intravenous contrast. CONTRAST:  69mL GADAVIST GADOBUTROL 1 MMOL/ML IV SOLN COMPARISON:  07/09/2013 FINDINGS: Segmentation:  5 lumbar type vertebral bodies. Alignment: Mild scoliotic curvature convex to the right. No antero or retrolisthesis. Vertebrae: No evidence of osseous metastatic disease within the region. Conus medullaris and cauda  equina: Conus extends to the L1 level. Conus and cauda equina appear normal. Paraspinal and other soft tissues: Negative Disc levels: T12-L1: Endplate osteophytes and bulging of the disc. Facet and ligamentous hypertrophy. Mild stenosis of both lateral recesses but without visible neural compression. L1-2: Endplate osteophytes and bulging of the disc. Mild facet and ligamentous hypertrophy. Mild stenosis of both lateral recesses but no apparent neural compression. L2-3: Endplate osteophytes and bulging of the disc. Facet and ligamentous hypertrophy. Narrowing of the lateral recesses and foramina, left more than right. Some potential for neural compression in the left lateral recess. L3-4: Endplate osteophytes and bulging of the disc. Mild facet and ligamentous hypertrophy. Mild stenosis of the left lateral recess and intervertebral foramen but without likely neural compression. L4-5: Chronic fusion across the disc space. Wide patency of the canal and foramina. L5-S1: Endplate osteophytes and bulging of the disc. Bilateral facet osteoarthritis. No compressive canal or foraminal stenosis. IMPRESSION: No evidence of regional metastatic disease. This study covers from mid T11 to mid S3. Chronic curvature and degenerative changes of the spine, only slightly progressive since 2014. No definite neural compression. Lateral recess narrowing with some possible potential for neural compression or irritation on the left at L2-3 and less so at L3-4. Electronically Signed   By: Nelson Chimes M.D.   On: 02/02/2021 15:30        Scheduled Meds:  clopidogrel  75 mg Oral Daily   enoxaparin (LOVENOX) injection  60 mg Subcutaneous Q24H   furosemide  60 mg Intravenous Q12H   pantoprazole  40 mg Oral Daily   pravastatin  20 mg Oral q1800   silver sulfADIAZINE   Topical BID   Continuous Infusions:   ceFAZolin (ANCEF) IV 2 g (02/04/21 0941)     LOS: 3 days    Time spent: 35 minutes    Nyjah Schwake Darleen Crocker, DO [facility]  If 7PM-7AM, please contact night-coverage [email or web address] 02/04/2021, 11:11 AM

## 2021-02-04 NOTE — Progress Notes (Signed)
Physical Therapy Treatment Patient Details Name: Bernard Ramirez MRN: [id number] DOB: 1931-05-11 Today's Date: 02/04/2021    History of Present Illness Bernard Ramirez  is a 87 y.o. male, with history of prostate cancer, CVA, dementia, GERD, hypertension, low back pain, persistent atrial fibrillation, and more presents ED with a chief complaint of cellulitis.  Patient request that I take history from wife, reporting that he cannot remember anything.  Wife reports that 10 days ago patient had a fall where he hit his leg against something causing a skin tear and laceration.  He was seen in our ED by Dr. Christy Gentles, and the wound was repaired.  Antibiotics were not indicated at that time.  Patient has developed generalized weakness since then.  Wife reports that patient cannot walk without max assist because of pain and weakness.  Wife reports the patient is also had increased swelling in that lower extremity, despite taking 40 mg of Lasix at home.  He has been on 60 mg Lasix in the past, but was recently reduced.  Patient has not had any fevers.  Wife is concerned that patient may have metastasis causing his generalized weakness, as he was diagnosed with stage IV prostate cancer 3.5 years ago and opted not to do treatment.  Wife reports the patient has had a good appetite, drinks plenty of liquid.  She reports that he has chronic urinary and bowel incontinence.  When she is not noted him to have any other complaints.    PT Comments    Patient presents in bed and agreeable for therapy.  Patient demonstrates slow labored movement for sitting up at bedside requiring Mod assist for propping up on hands and had to use bed rail, frequent verbal/tactile cueing scoot to EOB, very unsteady on feet and limited to a few unsteady labored steps at bedside before having to sit due to fatigue and BLE weakness.  Patient tolerated sitting up in chair after therapy - nursing staff notified.  Patient will benefit from continued  physical therapy in hospital and recommended venue below to increase strength, balance, endurance for safe ADLs and gait.      Follow Up Recommendations  SNF     Equipment Recommendations  None recommended by PT    Recommendations for Other Services       Precautions / Restrictions Precautions Precautions: Fall Restrictions Weight Bearing Restrictions: No    Mobility  Bed Mobility Overal bed mobility: Needs Assistance Bed Mobility: Supine to Sit     Supine to sit: Mod assist;HOB elevated     General bed mobility comments: increased time, labored movement, required use of bed rails and frequent verbal cues for properly using BUE to scoot to EOB    Transfers Overall transfer level: Needs assistance Equipment used: Rolling walker (2 wheeled) Transfers: Sit to/from [facility] Sit to Stand: Min assist;Mod assist Stand pivot transfers: Mod assist       General transfer comment: slow labored movement  Ambulation/Gait Ambulation/Gait assistance: Mod assist Gait Distance (Feet): 5 Feet Assistive device: Rolling walker (2 wheeled) Gait Pattern/deviations: Decreased step length - right;Decreased step length - left;Decreased stride length Gait velocity: decreased   General Gait Details: limited to 5-6 slow labored unsteady steps at bedside with frequent verbal/tactile cueing for safety and proper hand placement on RW   Stairs             Wheelchair Mobility    Modified Rankin (Stroke Patients Only)       Balance Overall balance  assessment: Needs assistance Sitting-balance support: Bilateral upper extremity supported;Feet supported Sitting balance-Leahy Scale: Fair Sitting balance - Comments: fair/good static, fair/poor dynamic when completing BLE exercises Postural control: Posterior lean Standing balance support: During functional activity;Bilateral upper extremity supported Standing balance-Leahy Scale: Poor Standing balance comment:  fair/poor using RW                            Cognition Arousal/Alertness: Awake/alert Behavior During Therapy: WFL for tasks assessed/performed Overall Cognitive Status: Within Functional Limits for tasks assessed                                        Exercises General Exercises - Lower Extremity Long Arc Quad: Seated;AROM;Strengthening;Both;10 reps Hip Flexion/Marching: Seated;AROM;Strengthening;Both;10 reps Toe Raises: Seated;AROM;Strengthening;Both;10 reps Heel Raises: Seated;AROM;Strengthening;Both;10 reps    General Comments        Pertinent Vitals/Pain Pain Assessment: Faces Faces Pain Scale: Hurts a little bit Pain Location: BUE with pressure Pain Descriptors / Indicators: Sore;Discomfort Pain Intervention(s): Limited activity within patient's tolerance;Monitored during session    Home Living                      Prior Function            PT Goals (current goals can now be found in the care plan section) Acute Rehab PT Goals Patient Stated Goal: return home PT Goal Formulation: With patient/family Time For Goal Achievement: 02/16/21 Potential to Achieve Goals: Good Progress towards PT goals: Progressing toward goals    Frequency    Min 3X/week      PT Plan Current plan remains appropriate    Co-evaluation              AM-PAC PT "6 Clicks" Mobility   Outcome Measure  Help needed turning from your back to your side while in a flat bed without using bedrails?: A Lot Help needed moving from lying on your back to sitting on the side of a flat bed without using bedrails?: A Lot Help needed moving to and from a bed to a chair (including a wheelchair)?: A Lot Help needed standing up from a chair using your arms (e.g., wheelchair or bedside chair)?: A Lot Help needed to walk in hospital room?: A Lot Help needed climbing 3-5 steps with a railing? : Total 6 Click Score: 11    End of Session   Activity  Tolerance: Patient tolerated treatment well;Patient limited by fatigue Patient left: in chair;with call bell/phone within reach;with chair alarm set Nurse Communication: Mobility status PT Visit Diagnosis: Unsteadiness on feet (R26.81);Other abnormalities of gait and mobility (R26.89);Muscle weakness (generalized) (M62.81)     Time: 2831-5176 PT Time Calculation (min) (ACUTE ONLY): 20 min  Charges:  $Therapeutic Exercise: 8-22 mins $Therapeutic Activity: 8-22 mins                     2:46 PM, 02/04/21 Lonell Grandchild, MPT Physical Therapist with [facility] [phone] office [phone] mobile phone

## 2021-02-04 NOTE — Care Management Important Message (Signed)
Important Message  Patient Details  Name: Bernard Ramirez MRN: [id number] Date of Birth: [birth year]   Medicare Important Message Given:  Yes     Tommy Medal 02/04/2021, 1:22 PM

## 2021-02-05 ENCOUNTER — Inpatient Hospital Stay
Admission: RE | Admit: 2021-02-05 | Discharge: 2021-02-17 | Disposition: A | Discharge status: Home / Self Care | Payer: PPO | Source: Ambulatory Visit | Attending: Internal Medicine | Admitting: Internal Medicine

## 2021-02-05 DIAGNOSIS — F0390 Unspecified dementia without behavioral disturbance: Secondary | ICD-10-CM | POA: Diagnosis not present

## 2021-02-05 DIAGNOSIS — L03116 Cellulitis of left lower limb: Secondary | ICD-10-CM | POA: Diagnosis not present

## 2021-02-05 DIAGNOSIS — K219 Gastro-esophageal reflux disease without esophagitis: Secondary | ICD-10-CM | POA: Diagnosis not present

## 2021-02-05 DIAGNOSIS — M549 Dorsalgia, unspecified: Secondary | ICD-10-CM | POA: Diagnosis not present

## 2021-02-05 DIAGNOSIS — M159 Polyosteoarthritis, unspecified: Secondary | ICD-10-CM | POA: Diagnosis not present

## 2021-02-05 DIAGNOSIS — E46 Unspecified protein-calorie malnutrition: Secondary | ICD-10-CM | POA: Diagnosis not present

## 2021-02-05 DIAGNOSIS — I4819 Other persistent atrial fibrillation: Secondary | ICD-10-CM | POA: Diagnosis not present

## 2021-02-05 DIAGNOSIS — L03119 Cellulitis of unspecified part of limb: Secondary | ICD-10-CM | POA: Diagnosis not present

## 2021-02-05 DIAGNOSIS — R262 Difficulty in walking, not elsewhere classified: Secondary | ICD-10-CM | POA: Diagnosis not present

## 2021-02-05 DIAGNOSIS — I4821 Permanent atrial fibrillation: Secondary | ICD-10-CM | POA: Diagnosis not present

## 2021-02-05 DIAGNOSIS — E441 Mild protein-calorie malnutrition: Secondary | ICD-10-CM | POA: Diagnosis not present

## 2021-02-05 DIAGNOSIS — R600 Localized edema: Secondary | ICD-10-CM | POA: Diagnosis not present

## 2021-02-05 DIAGNOSIS — M6281 Muscle weakness (generalized): Secondary | ICD-10-CM | POA: Diagnosis not present

## 2021-02-05 DIAGNOSIS — R42 Dizziness and giddiness: Secondary | ICD-10-CM | POA: Diagnosis not present

## 2021-02-05 DIAGNOSIS — N400 Enlarged prostate without lower urinary tract symptoms: Secondary | ICD-10-CM | POA: Diagnosis not present

## 2021-02-05 DIAGNOSIS — G893 Neoplasm related pain (acute) (chronic): Secondary | ICD-10-CM | POA: Diagnosis not present

## 2021-02-05 DIAGNOSIS — I639 Cerebral infarction, unspecified: Secondary | ICD-10-CM | POA: Diagnosis not present

## 2021-02-05 DIAGNOSIS — E1159 Type 2 diabetes mellitus with other circulatory complications: Secondary | ICD-10-CM | POA: Diagnosis not present

## 2021-02-05 DIAGNOSIS — Z9181 History of falling: Secondary | ICD-10-CM | POA: Diagnosis not present

## 2021-02-05 DIAGNOSIS — F411 Generalized anxiety disorder: Secondary | ICD-10-CM | POA: Diagnosis not present

## 2021-02-05 DIAGNOSIS — I5022 Chronic systolic (congestive) heart failure: Secondary | ICD-10-CM | POA: Diagnosis not present

## 2021-02-05 DIAGNOSIS — G4733 Obstructive sleep apnea (adult) (pediatric): Secondary | ICD-10-CM | POA: Diagnosis not present

## 2021-02-05 DIAGNOSIS — R2681 Unsteadiness on feet: Secondary | ICD-10-CM | POA: Diagnosis not present

## 2021-02-05 DIAGNOSIS — E876 Hypokalemia: Secondary | ICD-10-CM | POA: Diagnosis not present

## 2021-02-05 DIAGNOSIS — E1143 Type 2 diabetes mellitus with diabetic autonomic (poly)neuropathy: Secondary | ICD-10-CM | POA: Diagnosis not present

## 2021-02-05 DIAGNOSIS — I10 Essential (primary) hypertension: Secondary | ICD-10-CM | POA: Diagnosis not present

## 2021-02-05 DIAGNOSIS — R0683 Snoring: Secondary | ICD-10-CM | POA: Diagnosis not present

## 2021-02-05 DIAGNOSIS — I110 Hypertensive heart disease with heart failure: Secondary | ICD-10-CM | POA: Diagnosis not present

## 2021-02-05 DIAGNOSIS — C61 Malignant neoplasm of prostate: Secondary | ICD-10-CM | POA: Diagnosis not present

## 2021-02-05 DIAGNOSIS — I5032 Chronic diastolic (congestive) heart failure: Secondary | ICD-10-CM | POA: Diagnosis not present

## 2021-02-05 DIAGNOSIS — G253 Myoclonus: Secondary | ICD-10-CM | POA: Diagnosis not present

## 2021-02-05 DIAGNOSIS — E785 Hyperlipidemia, unspecified: Secondary | ICD-10-CM | POA: Diagnosis not present

## 2021-02-05 LAB — BASIC METABOLIC PANEL
Anion gap: 11 (ref 5–15)
BUN: 16 mg/dL (ref 8–23)
CO2: 29 mmol/L (ref 22–32)
Calcium: 8.8 mg/dL — ABNORMAL LOW (ref 8.9–10.3)
Chloride: 95 mmol/L — ABNORMAL LOW (ref 98–111)
Creatinine, Ser: 0.98 mg/dL (ref 0.61–1.24)
GFR, Estimated: >60 mL/min (ref >60)
Glucose, Bld: 130 mg/dL — ABNORMAL HIGH (ref 70–99)
Potassium: 3.8 mmol/L (ref 3.5–5.1)
Sodium: 135 mmol/L (ref 135–145)

## 2021-02-05 LAB — CBC
HCT: 39.3 % (ref 39.0–52.0)
Hemoglobin: 12.1 g/dL — ABNORMAL LOW (ref 13.0–17.0)
MCH: 27.4 pg (ref 26.0–34.0)
MCHC: 30.8 g/dL (ref 30.0–36.0)
MCV: 88.9 fL (ref 80.0–100.0)
Platelets: 271 10*3/uL (ref 150–400)
RBC: 4.42 MIL/uL (ref 4.22–5.81)
RDW: 14.1 % (ref 11.5–15.5)
WBC: 8.9 10*3/uL (ref 4.0–10.5)
nRBC: 0 % (ref 0.0–0.2)

## 2021-02-05 LAB — MAGNESIUM: Magnesium: 2.2 mg/dL (ref 1.7–2.4)

## 2021-02-05 MED ORDER — CEPHALEXIN 500 MG PO CAPS: 500.0000 mg | ORAL_CAPSULE | Freq: Four times a day (QID) | ORAL | 0 refills | Status: AC

## 2021-02-05 MED ORDER — HYDROCODONE-ACETAMINOPHEN 10-325 MG PO TABS: ORAL_TABLET | ORAL | 0 refills | Status: DC

## 2021-02-05 NOTE — Discharge Summary (Signed)
Physician Discharge Summary  JULIAS MOULD Ramirez:[id number] DOB: 13-Jul-1931 DOA: 02/01/2021  PCP: [name], MD  Admit date: 02/01/2021  Discharge date: 02/05/2021  Admitted From:Home  Disposition:  SNF  Recommendations for Outpatient Follow-up:  Follow up with PCP in 1-2 weeks Continue on Lasix as prior Continue on Keflex as prescribed for 6 more days to finish total 10-day course of treatment for left lower extremity cellulitis Continue wound care daily  Home Health: None  Equipment/Devices: None  Discharge Condition:Stable  CODE STATUS: DNR  Diet recommendation: Heart Healthy  Brief/Interim Summary:  Bernard Ramirez  is a 87 y.o. male, with history of prostate cancer, CVA, dementia, GERD, hypertension, low back pain, persistent atrial fibrillation-not on anticoagulation due to prior GI bleed, and more presents ED with a chief complaint of cellulitis.  He had a fall with trauma to his left lower extremity 10 days ago causing a skin tear that was repaired by EDP with suturing.  He has also had increased swelling of his lower extremities despite taking Lasix at home, but dose was recently reduced.  He was diuresed with IV Lasix 60 mg twice daily with significant improvement in his volume status and he is now euvolemic and at baseline.  He was treated for his wound and cellulitis with Ancef and can now be transition to Keflex.  He is to continue his usual dose of home Lasix as previously prescribed and continue on Keflex for 6 more days to complete course of treatment over a total 10-day period.  He is in stable condition for discharge to rehab facility today.  No other acute events noted throughout the course of this admission.  Discharge Diagnoses:  Principal Problem:   Cellulitis Active Problems:   Back pain   Atrial fibrillation (HCC)   Social problem   Protein calorie malnutrition (HCC)   Anxiety   Failure to thrive in adult  Principal discharge diagnosis: Left lower  extremity cellulitis with recent skin tear in the setting of volume overload.  Discharge Instructions  Discharge Instructions     Diet - low sodium heart healthy   Complete by: As directed    Discharge wound care:   Complete by: As directed    Daily dressing changes.   Increase activity slowly   Complete by: As directed       Allergies as of 02/05/2021       Reactions   Contrast Media [iodinated Diagnostic Agents]    Pt states he was given "dye" 20 yrs ago, anaphylaxis & syncope - was told to never have it again    Gabapentin Itching   Aspirin Other (See Comments)   Blistering   Latex Rash   Lipitor [atorvastatin] Itching   Tape Rash   EKG leads        Medication List     TAKE these medications    clobetasol cream 0.05 % Commonly known as: TEMOVATE Apply 1 application topically daily as needed (use as directed for skin).   clopidogrel 75 MG tablet Commonly known as: PLAVIX TAKE 1 TABLET(75 MG) BY MOUTH DAILY What changed: See the new instructions.   furosemide 40 MG tablet Commonly known as: LASIX TAKE 1 AND 1/2 TABLETS BY MOUTH EVERY MORNING THEN TAKE 1 TABLET BY MOUTH EVERY AFTERNOON What changed: See the new instructions.   HYDROcodone-acetaminophen 10-325 MG tablet Commonly known as: NORCO TAKE 1 TABLET  FIVE TIMES DAILY AS NEEDED FOR PAIN   losartan 100 MG tablet Commonly known as: COZAAR  TAKE 1 TABLET(100 MG) BY MOUTH DAILY What changed: See the new instructions.   meclizine 25 MG tablet Commonly known as: ANTIVERT TAKE 1 TABLET(25 MG) BY MOUTH AT BEDTIME   nystatin powder Generic drug: nystatin APPLY TO THE AFFECTED AREA TOPICALLY EVERY DAY AS NEEDED FOR RASH What changed: See the new instructions.   pantoprazole 40 MG tablet Commonly known as: PROTONIX TAKE 1 TABLET(40 MG) BY MOUTH DAILY What changed: See the new instructions.   potassium chloride SA 20 MEQ tablet Commonly known as: KLOR-CON TAKE 1 TABLET(20 MEQ) BY MOUTH DAILY What  changed: See the new instructions.   pravastatin 20 MG tablet Commonly known as: PRAVACHOL TAKE 1 TABLET(20 MG) BY MOUTH DAILY What changed: See the new instructions.   triamcinolone cream 0.1 % Commonly known as: KENALOG Apply 1 application topically daily as needed (for irritation).               Discharge Care Instructions  (From admission, onward)           Start     Ordered   02/05/21 0000  Discharge wound care:       Comments: Daily dressing changes.   02/05/21 1000            Contact information for after-discharge care     [facility] .   Service: Skilled Nursing Contact information: [address] [address] [phone]                    Allergies  Allergen Reactions   Contrast Media [Iodinated Diagnostic Agents]     Pt states he was given "dye" 20 yrs ago, anaphylaxis & syncope - was told to never have it again    Gabapentin Itching   Aspirin Other (See Comments)    Blistering    Latex Rash   Lipitor [Atorvastatin] Itching   Tape Rash    EKG leads    Consultations: Palliative care   Procedures/Studies: DG Tibia/Fibula Left  Result Date: 02/01/2021 CLINICAL DATA:  87 year old male with left lower extremity swelling. EXAM: LEFT FOOT - COMPLETE 3+ VIEW; LEFT TIBIA AND FIBULA - 2 VIEW COMPARISON:  Left foot radiograph dated 01/24/2021. FINDINGS: There is no acute fracture or dislocation. The bones are osteopenic. There is arthritic changes of the left knee with moderate narrowing of the medial compartment. There is a small suprapatellar effusion. There is diffuse subcutaneous edema and worsened soft tissue swelling of the forefoot. No radiopaque foreign object or soft tissue gas. IMPRESSION: 1. No acute fracture or dislocation. 2. Osteopenia. 3. Diffuse subcutaneous edema and worsened soft tissue swelling of the forefoot. Electronically Signed   By: Anner Crete M.D.   On: 02/01/2021 19:19   DG Tibia/Fibula Left  Result Date: 01/24/2021 CLINICAL DATA:  Left lower extremity pain and laceration EXAM: LEFT TIBIA AND FIBULA - 2 VIEW COMPARISON:  None. FINDINGS: Two view radiograph of the left foreleg demonstrates normal alignment. No fracture or dislocation. A soft tissue defect is seen within the a soft tissues anterior to the distal diaphysis of the tibia in keeping with a soft tissue laceration. This appears to extend in close proximity to the tibial cortex. Extensive vascular calcifications are seen within the a left lower extremity. There is diffuse subcutaneous edema of the left lower extremity. IMPRESSION: No acute fracture or dislocation.  Soft tissue laceration. Electronically Signed   By: Fidela Salisbury MD  On: 01/24/2021 00:34   MR Lumbar Spine W Wo Contrast  Result Date: 02/02/2021 CLINICAL DATA:  History of prostate cancer. Question osseous metastatic disease. EXAM: MRI LUMBAR SPINE WITHOUT AND WITH CONTRAST TECHNIQUE: Multiplanar and multiecho pulse sequences of the lumbar spine were obtained without and with intravenous contrast. CONTRAST:  68mL GADAVIST GADOBUTROL 1 MMOL/ML IV SOLN COMPARISON:  07/09/2013 FINDINGS: Segmentation:  5 lumbar type vertebral bodies. Alignment: Mild scoliotic curvature convex to the right. No antero or retrolisthesis. Vertebrae: No evidence of osseous metastatic disease within the region. Conus medullaris and cauda equina: Conus extends to the L1 level. Conus and cauda equina appear normal. Paraspinal and other soft tissues: Negative Disc levels: T12-L1: Endplate osteophytes and bulging of the disc. Facet and ligamentous hypertrophy. Mild stenosis of both lateral recesses but without visible neural compression. L1-2: Endplate osteophytes and bulging of the disc. Mild facet and ligamentous hypertrophy. Mild stenosis of both lateral recesses but no apparent neural compression. L2-3: Endplate osteophytes and bulging of  the disc. Facet and ligamentous hypertrophy. Narrowing of the lateral recesses and foramina, left more than right. Some potential for neural compression in the left lateral recess. L3-4: Endplate osteophytes and bulging of the disc. Mild facet and ligamentous hypertrophy. Mild stenosis of the left lateral recess and intervertebral foramen but without likely neural compression. L4-5: Chronic fusion across the disc space. Wide patency of the canal and foramina. L5-S1: Endplate osteophytes and bulging of the disc. Bilateral facet osteoarthritis. No compressive canal or foraminal stenosis. IMPRESSION: No evidence of regional metastatic disease. This study covers from mid T11 to mid S3. Chronic curvature and degenerative changes of the spine, only slightly progressive since 2014. No definite neural compression. Lateral recess narrowing with some possible potential for neural compression or irritation on the left at L2-3 and less so at L3-4. Electronically Signed   By: Nelson Chimes M.D.   On: 02/02/2021 15:30   DG Chest Port 1 View  Result Date: 01/24/2021 CLINICAL DATA:  Chest pain, fall EXAM: PORTABLE CHEST 1 VIEW COMPARISON:  05/21/2019 FINDINGS: Elevation of the right hemidiaphragm is unchanged. Mild interstitial thickening is stable. No superimposed confluent pulmonary infiltrate. No pneumothorax or pleural effusion. Cardiac size is within normal limits. The pulmonary vascularity is normal. No acute bone abnormality. IMPRESSION: No active disease.  Stable elevation of the right hemidiaphragm. Electronically Signed   By: Fidela Salisbury MD   On: 01/24/2021 00:35   DG Foot Complete Left  Result Date: 02/01/2021 CLINICAL DATA:  87 year old male with left lower extremity swelling. EXAM: LEFT FOOT - COMPLETE 3+ VIEW; LEFT TIBIA AND FIBULA - 2 VIEW COMPARISON:  Left foot radiograph dated 01/24/2021. FINDINGS: There is no acute fracture or dislocation. The bones are osteopenic. There is arthritic changes of the left  knee with moderate narrowing of the medial compartment. There is a small suprapatellar effusion. There is diffuse subcutaneous edema and worsened soft tissue swelling of the forefoot. No radiopaque foreign object or soft tissue gas. IMPRESSION: 1. No acute fracture or dislocation. 2. Osteopenia. 3. Diffuse subcutaneous edema and worsened soft tissue swelling of the forefoot. Electronically Signed   By: Anner Crete M.D.   On: 02/01/2021 19:19   DG Foot Complete Left  Result Date: 01/24/2021 CLINICAL DATA:  Left foot injury, soft tissue laceration EXAM: LEFT FOOT - COMPLETE 3+ VIEW COMPARISON:  None. FINDINGS: Three view radiograph left foot demonstrates normal alignment. The osseous structures are diffusely osteopenic. No acute fracture or dislocation. Joint spaces are preserved. Bimalleolar  soft tissue swelling is present, not well assessed on this exam IMPRESSION: No acute fracture or dislocation. Electronically Signed   By: Fidela Salisbury MD   On: 01/24/2021 03:03     Discharge Exam: Vitals:   02/04/21 2100 02/05/21 0459  BP: 133/69 117/65  Pulse: 72 75  Resp: 20 20  Temp: 99 F (37.2 C) 97.8 F (36.6 C)  SpO2: 94% 96%   Vitals:   02/04/21 0500 02/04/21 2100 02/05/21 0300 02/05/21 0459  BP:  133/69  117/65  Pulse:  72  75  Resp:  20  20  Temp:  99 F (37.2 C)  97.8 F (36.6 C)  TempSrc:  Oral  Oral  SpO2:  94%  96%  Weight: 114.6 kg  105 kg   Height:        General: Pt is alert, awake, not in acute distress Cardiovascular: RRR, S1/S2 +, no rubs, no gallops Respiratory: CTA bilaterally, no wheezing, no rhonchi Abdominal: Soft, NT, ND, bowel sounds + Extremities: no edema, no cyanosis, left lower extremity wound and erythema stable and improved.  Dressings clean dry and intact.    The results of significant diagnostics from this hospitalization (including imaging, microbiology, ancillary and laboratory) are listed below for reference.     Microbiology: Recent Results  (from the past 240 hour(s))  SARS CORONAVIRUS 2 (TAT 6-24 HRS) Nasopharyngeal Nasopharyngeal Swab     Status: None   Collection Time: 02/01/21  5:28 PM   Specimen: Nasopharyngeal Swab  Result Value Ref Range Status   SARS Coronavirus 2 NEGATIVE NEGATIVE Final    Comment: (NOTE) SARS-CoV-2 target nucleic acids are NOT DETECTED.  The SARS-CoV-2 RNA is generally detectable in upper and lower respiratory specimens during the acute phase of infection. Negative results do not preclude SARS-CoV-2 infection, do not rule out co-infections with other pathogens, and should not be used as the sole basis for treatment or other patient management decisions. Negative results must be combined with clinical observations, patient history, and epidemiological information. The expected result is Negative.  Fact Sheet for Patients: SugarRoll.be  Fact Sheet for Healthcare Providers: https://www.woods-mathews.com/  This test is not yet approved or cleared by the [place] FDA and  has been authorized for detection and/or diagnosis of SARS-CoV-2 by FDA under an Emergency Use Authorization (EUA). This EUA will remain  in effect (meaning this test can be used) for the duration of the COVID-19 declaration under Se ction 564(b)(1) of the Act, 21 U.S.C. section 360bbb-3(b)(1), unless the authorization is terminated or revoked sooner.  Performed at [facility], [place] [address], [place], [place] [address]      Labs: BNP (last 3 results) Recent Labs    02/24/20 1036 03/23/20 1225  BNP 44 55   Basic Metabolic Panel: Recent Labs  Lab 02/01/21 1740 02/02/21 0445 02/03/21 0551 02/04/21 0647 02/05/21 0624  NA 139 138 136 137 135  K 4.3 4.0 3.8 4.2 3.8  CL 106 104 98 95* 95*  CO2 25 28 31  33* 29  GLUCOSE 134* 134* 124* 122* 130*  BUN 13 13 12 14 16   CREATININE 0.85 0.92 0.97 1.08 0.98  CALCIUM 8.8* 8.5* 8.6* 8.8* 8.8*  MG  --  2.1 2.0 2.1 2.2    Liver Function Tests: Recent Labs  Lab 02/01/21 1740 02/02/21 0445  AST 16 14*  ALT 14 11  ALKPHOS 85 77  BILITOT 1.2 0.5  PROT 6.5 6.0*  ALBUMIN 3.2* 2.9*   No results for  input(s): LIPASE, AMYLASE in the last 168 hours. No results for input(s): AMMONIA in the last 168 hours. CBC: Recent Labs  Lab 02/01/21 1740 02/02/21 0445 02/03/21 0551 02/04/21 0647 02/05/21 0624  WBC 6.6 6.5 6.6 8.7 8.9  NEUTROABS 4.9 4.4  --   --   --   HGB 10.7* 10.5* 10.9* 11.5* 12.1*  HCT 34.9* 34.1* 34.8* 37.6* 39.3  MCV 90.9 90.9 89.7 90.2 88.9  PLT 266 252 265 283 271   Cardiac Enzymes: No results for input(s): CKTOTAL, CKMB, CKMBINDEX, TROPONINI in the last 168 hours. BNP: Invalid input(s): POCBNP CBG: No results for input(s): GLUCAP in the last 168 hours. D-Dimer No results for input(s): DDIMER in the last 72 hours. Hgb A1c No results for input(s): HGBA1C in the last 72 hours. Lipid Profile No results for input(s): CHOL, HDL, LDLCALC, TRIG, CHOLHDL, LDLDIRECT in the last 72 hours. Thyroid function studies No results for input(s): TSH, T4TOTAL, T3FREE, THYROIDAB in the last 72 hours.  Invalid input(s): FREET3 Anemia work up No results for input(s): VITAMINB12, FOLATE, FERRITIN, TIBC, IRON, RETICCTPCT in the last 72 hours. Urinalysis    Component Value Date/Time   COLORURINE YELLOW 02/01/2021 1934   APPEARANCEUR CLEAR 02/01/2021 1934   LABSPEC 1.015 02/01/2021 1934   PHURINE 6.0 02/01/2021 1934   GLUCOSEU NEGATIVE 02/01/2021 1934   HGBUR NEGATIVE 02/01/2021 1934   BILIRUBINUR NEGATIVE 02/01/2021 [place] 02/01/2021 1934   PROTEINUR NEGATIVE 02/01/2021 1934   UROBILINOGEN 1.0 12/22/2012 1742   NITRITE NEGATIVE 02/01/2021 1934   LEUKOCYTESUR NEGATIVE 02/01/2021 1934   Sepsis Labs Invalid input(s): PROCALCITONIN,  WBC,  LACTICIDVEN Microbiology Recent Results (from the past 240 hour(s))  SARS CORONAVIRUS 2 (TAT 6-24 HRS) Nasopharyngeal Nasopharyngeal  Swab     Status: None   Collection Time: 02/01/21  5:28 PM   Specimen: Nasopharyngeal Swab  Result Value Ref Range Status   SARS Coronavirus 2 NEGATIVE NEGATIVE Final    Comment: (NOTE) SARS-CoV-2 target nucleic acids are NOT DETECTED.  The SARS-CoV-2 RNA is generally detectable in upper and lower respiratory specimens during the acute phase of infection. Negative results do not preclude SARS-CoV-2 infection, do not rule out co-infections with other pathogens, and should not be used as the sole basis for treatment or other patient management decisions. Negative results must be combined with clinical observations, patient history, and epidemiological information. The expected result is Negative.  Fact Sheet for Patients: SugarRoll.be  Fact Sheet for Healthcare Providers: https://www.woods-mathews.com/  This test is not yet approved or cleared by the [place] FDA and  has been authorized for detection and/or diagnosis of SARS-CoV-2 by FDA under an Emergency Use Authorization (EUA). This EUA will remain  in effect (meaning this test can be used) for the duration of the COVID-19 declaration under Se ction 564(b)(1) of the Act, 21 U.S.C. section 360bbb-3(b)(1), unless the authorization is terminated or revoked sooner.  Performed at [facility], [place] [address] [place], [place] [address]      Time coordinating discharge: 35 minutes  SIGNED:   Rodena Goldmann, DO [facility] 02/05/2021, 10:34 AM  If 7PM-7AM, please contact night-coverage [email or web address]

## 2021-02-05 NOTE — TOC Transition Note (Signed)
Transition of Care [facility]) - CM/SW Discharge Note   Patient Details  Name: ANUBIS FUNDORA MRN: [id number] Date of Birth: [birth year]  [facility] CM/SW Contact:  Natasha Bence, LCSW Phone Number: 02/05/2021, 1:22 PM   Clinical Narrative:    CSW notified of patient's readiness for discharge. CSW contacted [facility]. [place] agreeable to take patient and provide transport. Nurse to call report. TOC signing off.   Final next level of care: Skilled Nursing Facility Barriers to Discharge: Barriers Resolved   Patient Goals and CMS Choice Patient states their goals for this hospitalization and ongoing recovery are:: rehab with SNF CMS Medicare.gov Compare Post Acute Care list provided to:: Patient Choice offered to / list presented to : Patient  Discharge Placement                Patient to be transferred to facility by: [facility] Name of family member notified: Daemon, Dowty (Spouse)   [phone] Patient and family notified of of transfer: 02/05/21  Discharge Plan and Services                                     Social Determinants of Health ([place]) Interventions     Readmission Risk Interventions Readmission Risk Prevention Plan 02/02/2021  Medication Screening Complete  Transportation Screening Complete  Some recent data might be hidden

## 2021-02-07 ENCOUNTER — Encounter: Payer: Self-pay | Admitting: Adult Health

## 2021-02-07 ENCOUNTER — Other Ambulatory Visit: Payer: Self-pay | Admitting: Adult Health

## 2021-02-07 ENCOUNTER — Non-Acute Institutional Stay (SKILLED_NURSING_FACILITY): Payer: PPO | Admitting: Adult Health

## 2021-02-07 DIAGNOSIS — E876 Hypokalemia: Secondary | ICD-10-CM | POA: Diagnosis not present

## 2021-02-07 DIAGNOSIS — I110 Hypertensive heart disease with heart failure: Secondary | ICD-10-CM | POA: Diagnosis not present

## 2021-02-07 DIAGNOSIS — I4821 Permanent atrial fibrillation: Secondary | ICD-10-CM | POA: Diagnosis not present

## 2021-02-07 DIAGNOSIS — L03116 Cellulitis of left lower limb: Secondary | ICD-10-CM

## 2021-02-07 DIAGNOSIS — K219 Gastro-esophageal reflux disease without esophagitis: Secondary | ICD-10-CM

## 2021-02-07 DIAGNOSIS — I5022 Chronic systolic (congestive) heart failure: Secondary | ICD-10-CM

## 2021-02-07 DIAGNOSIS — E441 Mild protein-calorie malnutrition: Secondary | ICD-10-CM | POA: Diagnosis not present

## 2021-02-07 DIAGNOSIS — R42 Dizziness and giddiness: Secondary | ICD-10-CM | POA: Diagnosis not present

## 2021-02-07 DIAGNOSIS — E785 Hyperlipidemia, unspecified: Secondary | ICD-10-CM | POA: Diagnosis not present

## 2021-02-07 MED ORDER — HYDROCODONE-ACETAMINOPHEN 10-325 MG PO TABS: 1.0000 | ORAL_TABLET | Freq: Four times a day (QID) | ORAL | 0 refills | Status: DC | PRN

## 2021-02-07 NOTE — Progress Notes (Signed)
Location:  [facility] Room Number: 132-P Place of Service:  SNF (31)   CODE STATUS: DNR  Allergies  Allergen Reactions  . Contrast Media [Iodinated Diagnostic Agents]     Pt states he was given "dye" 20 yrs ago, anaphylaxis & syncope - was told to never have it again   . Gabapentin Itching  . Aspirin Other (See Comments)    Blistering   . Latex Rash  . Lipitor [Atorvastatin] Itching  . Tape Rash    EKG leads    Chief Complaint  Patient presents with  . Hospitalization Follow-up    HPI:  He is a 87 year old man who has been hospitalized from 02-01-21 through 02-05-21. His medical history includes: CVA; gerd; dementia; hypertension. He presented to the ED due to complaints of cellulitis. He had had a fall at home with trauma to his left lower extremity 10 days prior hospitalization. He will need to complete his coarse of keflex. He is here for short term rehab and wound management with his goal to return back home. He does have lower extremity pain present. He has chronic incontinence of stool. He denies any anxiety or insomnia. He continues to be followed for his chronic illnesses including: Permanent atrial fibrillation:   Chronic systolic congestive heart failure:     GERD without esophagitis:  Hypokalemia   Past Medical History:  Diagnosis Date  . Anemia    04/2012: H&H-10.2/31.5, MCV-77; 06/2012:12/38.9  . Arthritis   . Benign prostatic hypertrophy    s/p transurethral resection of the prostate  . Chest pain    2004; with dyspnea, stress nuclear-normal EF + questionable inferior wall ischemia, normal coronary angiography;  2010-negative stress nuclear  . CVA (cerebral infarction)    asymptomatic (seen on CT)  . Diarrhea    h/o Hemoccult-positive stool  . Dysrhythmia   . Edema of both legs   . GERD (gastroesophageal reflux disease)   . History of blood transfusion   . History of kidney stones   . History of urinary tract infection   .  Hypertension   . Jerking movements of extremities    left arm   . Low back pain   . Obstructive sleep apnea    pt states can not use the CPAP  . Peripheral neuropathy    lower legs and feet bilat   . Persistent atrial fibrillation ([place]) 2013   Asymptomatic; diagnosed in 05/2012  . Pneumonia   . Prostate cancer ([place]) 2017   stage 4  . Shortness of breath dyspnea    pt states only develops SOB if tries to do something too quickly  . Upper GI bleed 1985   1985-peptic ulcer disease; initial hemigastrectomy; subsequent Billroth II  . Urinary hesitancy   . Vertigo    ED evaluation-06/2012    Past Surgical History:  Procedure Laterality Date  . Billroth II    . CHOLECYSTECTOMY    . COLONOSCOPY  Approximately 2000  . COLONOSCOPY  10/2012   Colonoscopy March 2014 at [facility], difficulty exam requiring fluoroscopy and overtube. Patient developed severe bradycardia again during his procedure like he did [facility]. Multiple polyps removed which were tubular adenomas. Previously tattooed sites at 20 and 30 cm were free of any polypoid change. Left-sided diverticulosis noted.  . COLONOSCOPY WITH ESOPHAGOGASTRODUODENOSCOPY (EGD)  06-17-2009   NFA:[id number] tubular adenomas and 2 tubulovillous adenomas, incomplete, 3 clips placed  . ESOPHAGOGASTRODUODENOSCOPY  06/17/2009   SLF: normal/chronic gastritis (non-h pylori)  .  ESOPHAGOGASTRODUODENOSCOPY N/A 05/11/2015   MGQ:QPYPPJK anemai due to postgastrectomy state/moderate erosive gastritis  . INGUINAL HERNIA REPAIR     Left  . ROTATOR CUFF REPAIR  1991   Bilateral  . TRANSURETHRAL RESECTION OF PROSTATE  06/2016  . TRANSURETHRAL RESECTION OF PROSTATE N/A 12/27/2012   Procedure: TRANSURETHRAL RESECTION OF THE PROSTATE (TURP);  Surgeon: Marissa Nestle, MD;  Location: [facility];  Service: Urology;  Laterality: N/A;  . TRANSURETHRAL RESECTION OF PROSTATE N/A 04/11/2016   Procedure: TRANSURETHRAL RESECTION OF THE PROSTATE (TURP);  Surgeon: Irine Seal, MD;  Location: [facility];  Service: Urology;  Laterality: N/A;  . VAGOTOMY AND PYLOROPLASTY  1970s   Details of procedure uncertain; [id number]    Social History   Socioeconomic History  . Marital status: Married    Spouse name: [name]  . Number of children: 5  . Years of education: Not on file  . Highest education level: Not on file  Occupational History  . Occupation: Retired Pensions consultant: RETIRED  Tobacco Use  . Smoking status: Former    Pack years: 0.00    Types: Cigars    Quit date: 08/22/2003    Years since quitting: 17.4  . Smokeless tobacco: Never  . Tobacco comments:    Quit many years ago; few cigars per day; didn't inhale  Vaping Use  . Vaping Use: Never used  Substance and Sexual Activity  . Alcohol use: No    Alcohol/week: 1.0 standard drink    Types: 1 Standard drinks or equivalent per week  . Drug use: No  . Sexual activity: Not on file  Other Topics Concern  . Not on file  Social History Narrative   Lives w/ wife in [place]   Retired truck Geophysicist/field seismologist   Social Determinants of Radio broadcast assistant Strain: Not on file  Food Insecurity: Not on file  Transportation Needs: Not on file  Physical Activity: Not on file  Stress: Not on file  Social Connections: Not on file  Intimate Partner Violence: Not on file   Family History  Problem Relation Age of Onset  . Diabetes Mellitus II Mother   . Allergic rhinitis Mother   . Lung disease Father   . Allergic rhinitis Father   . Angioedema Neg Hx   . Asthma Neg Hx   . Atopy Neg Hx   . Eczema Neg Hx   . Immunodeficiency Neg Hx   . Urticaria Neg Hx   . Cystic fibrosis Neg Hx   . Lupus Neg Hx   . Emphysema Neg Hx   . Migraines Neg Hx       VITAL SIGNS BP 112/64   Pulse 84   Temp 97.7 F (36.5 C)   Ht 6' (1.829 m)   BMI 31.39 kg/m   Outpatient Encounter Medications as of 02/07/2021  Medication Sig Note  . cephALEXin (KEFLEX) 500 MG capsule Take 1 capsule (500 mg total) by  mouth 4 (four) times daily for 6 days.   . clobetasol cream (TEMOVATE) 1.24 % Apply 1 application topically daily as needed (use as directed for skin).    Marland Kitchen clopidogrel (PLAVIX) 75 MG tablet TAKE 1 TABLET(75 MG) BY MOUTH DAILY 02/02/2021: Pt's wife unsure if pt got this yesterday or what time pt takes  . furosemide (LASIX) 40 MG tablet TAKE 1 AND 1/2 TABLETS BY MOUTH EVERY MORNING THEN TAKE 1 TABLET BY MOUTH EVERY AFTERNOON   . HYDROcodone-acetaminophen (NORCO) 10-325 MG tablet Take  1 tablet by mouth every 6 (six) hours as needed for up to 4 days.   Marland Kitchen losartan (COZAAR) 100 MG tablet TAKE 1 TABLET(100 MG) BY MOUTH DAILY   . meclizine (ANTIVERT) 25 MG tablet TAKE 1 TABLET(25 MG) BY MOUTH AT BEDTIME   . NON FORMULARY Diet:Regular, NAS   . NYSTATIN powder APPLY TO THE AFFECTED AREA TOPICALLY EVERY DAY AS NEEDED FOR RASH   . pantoprazole (PROTONIX) 40 MG tablet TAKE 1 TABLET(40 MG) BY MOUTH DAILY   . potassium chloride SA (KLOR-CON) 20 MEQ tablet TAKE 1 TABLET(20 MEQ) BY MOUTH DAILY 02/02/2021: Pt uses otc  . pravastatin (PRAVACHOL) 20 MG tablet TAKE 1 TABLET(20 MG) BY MOUTH DAILY   . silver sulfADIAZINE (SILVADENE) 1 % cream Apply 1 application topically 2 (two) times daily. Special Instructions: Clean left lower leg with NS. Apply silvadene. Cover with gauze, wrap with kling, cover with an ace wrap   . triamcinolone cream (KENALOG) 0.1 % Apply 1 application topically daily as needed (for irritation).     No facility-administered encounter medications on file as of 02/07/2021.     SIGNIFICANT DIAGNOSTIC EXAMS   LABS REVIEWED:   02-01-21: wbc 6.6; hgb 10.7 hct 34.9; mcv 90.9 plt 266; glucose 134; bun 13; creat 0.85; k+ 4.3; na++ 139; ca 8.8; GFR>60; liver normal albumin 3.2 02-05-21: wbc 8.9; hgb 12.1; hct 39.3; mcv 88.9 plt 271; glucose 130; bun 16; creat 0.98; k+ 3.8; na++ 135; ca 8.8; GFR>60   Review of Systems  Constitutional:  Negative for malaise/fatigue.  Respiratory:  Negative for cough  and shortness of breath.   Cardiovascular:  Negative for chest pain, palpitations and leg swelling.  Gastrointestinal:  Negative for abdominal pain, constipation and heartburn.  Musculoskeletal:  Negative for back pain, joint pain and myalgias.  Skin:        sores  Neurological:  Negative for dizziness.  Psychiatric/Behavioral:  The patient is not nervous/anxious.    Physical Exam Constitutional:      General: He is not in acute distress.    Appearance: He is well-developed. He is not diaphoretic.  Neck:     Thyroid: No thyromegaly.  Cardiovascular:     Rate and Rhythm: Normal rate and regular rhythm.     Pulses: Normal pulses.     Heart sounds: Murmur heard.     Comments: 3/6 Pulmonary:     Effort: Pulmonary effort is normal. No respiratory distress.     Comments: Lung sounds coarse diminished bases Abdominal:     General: Bowel sounds are normal. There is no distension.     Palpations: Abdomen is soft.     Tenderness: There is no abdominal tenderness.  Musculoskeletal:        General: Normal range of motion.     Cervical back: Neck supple.     Right lower leg: Edema present.     Left lower leg: Edema present.     Comments: 3+ edema left >right  Lymphadenopathy:     Cervical: No cervical adenopathy.  Skin:    General: Skin is warm and dry.     Comments: Ulceration left lower extremity; dressings intact   Neurological:     Mental Status: He is alert and oriented to person, place, and time.  Psychiatric:        Mood and Affect: Mood normal.      ASSESSMENT/ PLAN:  TODAY  Cellulitis left lower extremity: is stable will complete keflex will continue wound care. Has vicodin 10/325  mg every 6 hours as needed   2. Permanent atrial fibrillation: heart rate is stable not on anticoagulation therapy due to history of GI bleed. Will continue plavix 75 mg daily   3. Chronic systolic congestive heart failure: is stable will continue lasix 60 mg in the AM and 40 mg in the PM  with k+ 20 meq daily  4. GERD without esophagitis: is stable will continue protonix 40 mg daily   5. Hypokalemia: k+ 3.8 will continue k+ 20 meq daily   6. Hyperlipidemia LDL goal <100: will continue pravachol 20 mg daily   7. Mild calorie protein malnutrition: albumin 3.2 will continue supplements as directed  8. Vertigo is stable takes antivert 25 mg nightly   9. Hypertensive heart disease with chronic systolic congestive heart failure: is stable b/p 112/64 will continue coazaar 100 mg daily   Time spent with patient 45 minutes: goals of care; therapy needs.   Ok Edwards NP [facility]  Contact [phone] Monday through Friday 8am- 5pm  After hours call [phone]

## 2021-02-08 ENCOUNTER — Encounter: Payer: Self-pay | Admitting: Internal Medicine

## 2021-02-08 ENCOUNTER — Non-Acute Institutional Stay (SKILLED_NURSING_FACILITY): Payer: PPO | Admitting: Internal Medicine

## 2021-02-08 DIAGNOSIS — R0683 Snoring: Secondary | ICD-10-CM | POA: Diagnosis not present

## 2021-02-08 DIAGNOSIS — I4821 Permanent atrial fibrillation: Secondary | ICD-10-CM

## 2021-02-08 DIAGNOSIS — L03119 Cellulitis of unspecified part of limb: Secondary | ICD-10-CM | POA: Diagnosis not present

## 2021-02-08 NOTE — Assessment & Plan Note (Addendum)
02/08/2021 clinically he is in A. fib with adequately controlled, slow rate.He is now on Plavix. In 2013 Dr Lattie Haw had Rxed Bernard Ramirez in 2013, but this was D/Ced due to hx GI bleed.Marland Kitchen

## 2021-02-08 NOTE — Progress Notes (Signed)
NURSING HOME LOCATION:   [facility] ROOM NUMBER:  [id number]  CODE STATUS:  DNR  PCP:  Jenna Luo MD  This is a comprehensive admission note to this SNFperformed on this date less than 30 days from date of admission. Included are preadmission medical/surgical history; reconciled medication list; family history; social history and comprehensive review of systems.  Corrections and additions to the records were documented. Comprehensive physical exam was also performed. Additionally a clinical summary was entered for each active diagnosis pertinent to this admission in the Problem List to enhance continuity of care.  HPI: He was hospitalized 6/14 - 02/05/2021 presenting with cellulitis.  Approximately 10 days PTA he had fallen with trauma to the LLE resulting in a skin tear which was repaired by the ED physician with suturing.  Despite continuing his Lasix, he had increased swelling of both lower extremities. In the ED he received IV Lasix 60 mg twice with significant improvement in the volume status. Wound and cellulitis were treated with Ancef which was transitioned to oral Keflex. He was discharged to SNF for PT/OT.  Past medical and surgical history: Includes history of prostate cancer, history of CVA, GERD, essential hypertension, OSA, persistent A. fib, and history of vertigo. Surgeries and procedures include cholecystectomy, colonoscopies, EGD, vagotomy and pyloroplasty in the 1970s, and TURP in 2014 and 2017.  Social history: Nondrinker; former smoker.  Family history: Noncontributory due to advanced age.   Review of systems: When asked the reason for his hospitalization his response was "checkup".  He went on to say "cut my leg open and they fixed it".  He stated that he fell because he "blacked out" and "legs gave out".  He denied any specific cardiac or neurologic prodrome.  Constitutional: No fever, significant weight change  Eyes: No redness, discharge, pain,  vision change ENT/mouth: No nasal congestion, purulent discharge, earache, change in hearing, sore throat  Cardiovascular: No chest pain, palpitations, paroxysmal nocturnal dyspnea, claudication, edema  Respiratory: No cough, sputum production, hemoptysis, DOE, significant snoring, apnea Gastrointestinal: No heartburn, dysphagia, abdominal pain, nausea /vomiting, rectal bleeding, melena, change in bowels Genitourinary: No dysuria, hematuria, pyuria, incontinence, nocturia Musculoskeletal: No joint stiffness, joint swelling Dermatologic: No rash, pruritus, change in appearance of skin Neurologic: No dizziness, headache, seizures, numbness, tingling Psychiatric: No significant anxiety, depression, insomnia, anorexia Endocrine: No change in hair/skin/nails, excessive thirst, excessive hunger, excessive urination  Hematologic/lymphatic: No significant bruising, lymphadenopathy, abnormal bleeding Allergy/immunology: No itchy/watery eyes, significant sneezing, urticaria, angioedema  Physical exam:  Pertinent or positive findings: When I entered the room he was sleeping soundly.  He exhibited snoring, hypopnea, and even slight apnea.  He could be aroused.  He exhibited masked facies.  There was slight esotropia of the left eye.  Ptosis is present bilaterally, greater on the left.  He has complete dentures.  He had expiratory musical rhonchi which partially obscured the heart sounds.  Heart rhythm and rate appeared to be slow and irregular.  Abdomen is protuberant.  Pedal pulses are decreased.  The left lower extremity is wrapped.  When pedal pulses were checked his toes were upgoing bilaterally.  He has bruising over the forearms,greater on the left.  General appearance: Adequately nourished; no acute distress, increased work of breathing is present.   Lymphatic: No lymphadenopathy about the head, neck, axilla. Eyes: No conjunctival inflammation or lid edema is present. There is no scleral icterus. Ears:   External ear exam shows no significant lesions or deformities.   Nose:  External nasal examination shows no deformity or inflammation. Nasal mucosa are pink and moist without lesions, exudates Oral exam: Lips and gums are healthy appearing.There is no oropharyngeal erythema or exudate. Neck:  No thyromegaly, masses, tenderness noted.    Heart:  No gallop, murmur, click, rub.  Lungs:  without wheezes,  rales, rubs. Abdomen: Bowel sounds are normal.  Abdomen is soft and nontender with no organomegaly, hernias, masses. GU: Deferred  Extremities:  No cyanosis, clubbing, edema. Neurologic exam: Balance, Rhomberg, finger to nose testing could not be completed due to clinical state Skin: Warm & dry w/o tenting. No visible significant lesions or rash.(Note: wound LLE wrapped> Wound Care Nurse monitors).  See clinical summary under each active problem in the Problem List with associated updated therapeutic plan

## 2021-02-08 NOTE — Patient Instructions (Signed)
See assessment and plan under each diagnosis in the problem list and acutely for this visit 

## 2021-02-11 NOTE — Assessment & Plan Note (Signed)
SNF Wound Care Nurse monitoring

## 2021-02-11 NOTE — Assessment & Plan Note (Signed)
Any OSA evaluation deferred to his PCP post D/C from SNF

## 2021-02-16 ENCOUNTER — Non-Acute Institutional Stay (SKILLED_NURSING_FACILITY): Payer: PPO | Admitting: Adult Health

## 2021-02-16 ENCOUNTER — Encounter: Payer: Self-pay | Admitting: Adult Health

## 2021-02-16 ENCOUNTER — Other Ambulatory Visit: Payer: Self-pay | Admitting: Adult Health

## 2021-02-16 DIAGNOSIS — L03116 Cellulitis of left lower limb: Secondary | ICD-10-CM

## 2021-02-16 MED ORDER — HYDROCODONE-ACETAMINOPHEN 10-325 MG PO TABS: 1.0000 | ORAL_TABLET | Freq: Four times a day (QID) | ORAL | 0 refills | Status: DC | PRN

## 2021-02-16 NOTE — Progress Notes (Signed)
Location:  [facility] Room Number: 132 Place of Service:  SNF (31)   CODE STATUS: dnr   Allergies  Allergen Reactions  . Contrast Media [Iodinated Diagnostic Agents]     Pt states he was given "dye" 20 yrs ago, anaphylaxis & syncope - was told to never have it again   . Gabapentin Itching  . Aspirin Other (See Comments)    Blistering   . Latex Rash  . Lipitor [Atorvastatin] Itching  . Tape Rash    EKG leads    Chief Complaint  Patient presents with  . Acute Visit      Left leg pain     HPI:  He continues to have left leg pain. The pain is significant and is interfering with his ability to participate in therapy. His dressing change is painful. He will not allow therapy to stretch his lower extremity due to pain.   Past Medical History:  Diagnosis Date  . Anemia    04/2012: H&H-10.2/31.5, MCV-77; 06/2012:12/38.9  . Arthritis   . Benign prostatic hypertrophy    s/p transurethral resection of the prostate  . Chest pain    2004; with dyspnea, stress nuclear-normal EF + questionable inferior wall ischemia, normal coronary angiography;  2010-negative stress nuclear  . CVA (cerebral infarction)    asymptomatic (seen on CT)  . Diarrhea    h/o Hemoccult-positive stool  . Dysrhythmia   . Edema of both legs   . GERD (gastroesophageal reflux disease)   . History of blood transfusion   . History of kidney stones   . History of urinary tract infection   . Hypertension   . Jerking movements of extremities    left arm   . Low back pain   . Obstructive sleep apnea    pt states can not use the CPAP  . Peripheral neuropathy    lower legs and feet bilat   . Persistent atrial fibrillation ([place]) 2013   Asymptomatic; diagnosed in 05/2012  . Pneumonia   . Prostate cancer ([place]) 2017   stage 4  . Shortness of breath dyspnea    pt states only develops SOB if tries to do something too quickly  . Upper GI bleed 1985   1985-peptic ulcer disease; initial  hemigastrectomy; subsequent Billroth II  . Urinary hesitancy   . Vertigo    ED evaluation-06/2012    Past Surgical History:  Procedure Laterality Date  . Billroth II    . CHOLECYSTECTOMY    . COLONOSCOPY  Approximately 2000  . COLONOSCOPY  10/2012   Colonoscopy March 2014 at [facility], difficulty exam requiring fluoroscopy and overtube. Patient developed severe bradycardia again during his procedure like he did [facility]. Multiple polyps removed which were tubular adenomas. Previously tattooed sites at 20 and 30 cm were free of any polypoid change. Left-sided diverticulosis noted.  . COLONOSCOPY WITH ESOPHAGOGASTRODUODENOSCOPY (EGD)  06-17-2009   YTK:[id number] tubular adenomas and 2 tubulovillous adenomas, incomplete, 3 clips placed  . ESOPHAGOGASTRODUODENOSCOPY  06/17/2009   SLF: normal/chronic gastritis (non-h pylori)  . ESOPHAGOGASTRODUODENOSCOPY N/A 05/11/2015   EXN:[id number] anemai due to postgastrectomy state/moderate erosive gastritis  . INGUINAL HERNIA REPAIR     Left  . ROTATOR CUFF REPAIR  1991   Bilateral  . TRANSURETHRAL RESECTION OF PROSTATE  06/2016  . TRANSURETHRAL RESECTION OF PROSTATE N/A 12/27/2012   Procedure: TRANSURETHRAL RESECTION OF THE PROSTATE (TURP);  Surgeon: Marissa Nestle, MD;  Location: [facility]S;  Service: Urology;  Laterality:  N/A;  . TRANSURETHRAL RESECTION OF PROSTATE N/A 04/11/2016   Procedure: TRANSURETHRAL RESECTION OF THE PROSTATE (TURP);  Surgeon: Irine Seal, MD;  Location: [facility]S;  Service: Urology;  Laterality: N/A;  . VAGOTOMY AND PYLOROPLASTY  1970s   Details of procedure uncertain; 9509T    Social History   Socioeconomic History  . Marital status: Married    Spouse name: [name]  . Number of children: 5  . Years of education: Not on file  . Highest education level: Not on file  Occupational History  . Occupation: Retired Pensions consultant: RETIRED  Tobacco Use  . Smoking status: Former    Pack years: 0.00    Types: Cigars     Quit date: 08/22/2003    Years since quitting: 17.5  . Smokeless tobacco: Never  . Tobacco comments:    Quit many years ago; few cigars per day; didn't inhale  Vaping Use  . Vaping Use: Never used  Substance and Sexual Activity  . Alcohol use: No    Alcohol/week: 1.0 standard drink    Types: 1 Standard drinks or equivalent per week  . Drug use: No  . Sexual activity: Not on file  Other Topics Concern  . Not on file  Social History Narrative   Lives w/ wife in [place]   Retired truck Geophysicist/field seismologist   Social Determinants of Radio broadcast assistant Strain: Not on file  Food Insecurity: Not on file  Transportation Needs: Not on file  Physical Activity: Not on file  Stress: Not on file  Social Connections: Not on file  Intimate Partner Violence: Not on file   Family History  Problem Relation Age of Onset  . Diabetes Mellitus II Mother   . Allergic rhinitis Mother   . Lung disease Father   . Allergic rhinitis Father   . Angioedema Neg Hx   . Asthma Neg Hx   . Atopy Neg Hx   . Eczema Neg Hx   . Immunodeficiency Neg Hx   . Urticaria Neg Hx   . Cystic fibrosis Neg Hx   . Lupus Neg Hx   . Emphysema Neg Hx   . Migraines Neg Hx       VITAL SIGNS BP (!) 107/50   Pulse 68   Temp 97.8 F (36.6 C)   Resp 18   Ht 6' (1.829 m)   Wt 231 lb (104.8 kg)   SpO2 96%   BMI 31.33 kg/m   Outpatient Encounter Medications as of 02/16/2021  Medication Sig Note  . clobetasol cream (TEMOVATE) 2.67 % Apply 1 application topically daily as needed (use as directed for skin).    Marland Kitchen clopidogrel (PLAVIX) 75 MG tablet TAKE 1 TABLET(75 MG) BY MOUTH DAILY 02/02/2021: Pt's wife unsure if pt got this yesterday or what time pt takes  . furosemide (LASIX) 40 MG tablet TAKE 1 AND 1/2 TABLETS BY MOUTH EVERY MORNING THEN TAKE 1 TABLET BY MOUTH EVERY AFTERNOON   . losartan (COZAAR) 100 MG tablet TAKE 1 TABLET(100 MG) BY MOUTH DAILY   . meclizine (ANTIVERT) 25 MG tablet TAKE 1 TABLET(25 MG) BY MOUTH  AT BEDTIME   . NON FORMULARY Diet:Regular, NAS   . NYSTATIN powder APPLY TO THE AFFECTED AREA TOPICALLY EVERY DAY AS NEEDED FOR RASH   . pantoprazole (PROTONIX) 40 MG tablet TAKE 1 TABLET(40 MG) BY MOUTH DAILY   . potassium chloride SA (KLOR-CON) 20 MEQ tablet TAKE 1 TABLET(20 MEQ) BY MOUTH DAILY 02/02/2021: Pt  uses otc  . pravastatin (PRAVACHOL) 20 MG tablet TAKE 1 TABLET(20 MG) BY MOUTH DAILY   . silver sulfADIAZINE (SILVADENE) 1 % cream Apply 1 application topically 2 (two) times daily. Special Instructions: Clean left lower leg with NS. Apply silvadene. Cover with gauze, wrap with kling, cover with an ace wrap   . triamcinolone cream (KENALOG) 0.1 % Apply 1 application topically daily as needed (for irritation).     No facility-administered encounter medications on file as of 02/16/2021.     SIGNIFICANT DIAGNOSTIC EXAMS   LABS REVIEWED: PREVIOUS   02-01-21: wbc 6.6; hgb 10.7 hct 34.9; mcv 90.9 plt 266; glucose 134; bun 13; creat 0.85; k+ 4.3; na++ 139; ca 8.8; GFR>60; liver normal albumin 3.2 02-05-21: wbc 8.9; hgb 12.1; hct 39.3; mcv 88.9 plt 271; glucose 130; bun 16; creat 0.98; k+ 3.8; na++ 135; ca 8.8; GFR>60  NO NEW LABS.   Review of Systems  Constitutional:  Negative for malaise/fatigue.  Respiratory:  Negative for cough and shortness of breath.   Cardiovascular:  Negative for chest pain, palpitations and leg swelling.  Gastrointestinal:  Negative for abdominal pain, constipation and heartburn.  Musculoskeletal:  Positive for myalgias. Negative for back pain and joint pain.       Left leg pain   Skin: Negative.   Neurological:  Negative for dizziness.  Psychiatric/Behavioral:  The patient is not nervous/anxious.    Physical Exam Constitutional:      General: He is not in acute distress.    Appearance: He is well-developed. He is not diaphoretic.  Neck:     Thyroid: No thyromegaly.  Cardiovascular:     Rate and Rhythm: Normal rate and regular rhythm.     Pulses: Normal  pulses.     Heart sounds: Murmur heard.  Pulmonary:     Effort: Pulmonary effort is normal. No respiratory distress.     Breath sounds: Normal breath sounds.  Abdominal:     General: Bowel sounds are normal. There is no distension.     Palpations: Abdomen is soft.     Tenderness: There is no abdominal tenderness.  Musculoskeletal:        General: Normal range of motion.     Cervical back: Neck supple.     Right lower leg: Edema present.     Left lower leg: Edema present.     Comments: 3+ edema left >right   Lymphadenopathy:     Cervical: No cervical adenopathy.  Skin:    General: Skin is warm and dry.     Comments: Left lower leg ulceration: leg is wrapped   Neurological:     Mental Status: He is alert and oriented to person, place, and time.  Psychiatric:        Mood and Affect: Mood normal.     ASSESSMENT/ PLAN:  TODAY  Left lower extremity cellulitis: is worse has completed abt; will restart vicodin 10/325 mg every 6 hours as needed through 02-23-21 will monitor his status.   Ok Edwards NP [facility]  Contact [phone] Monday through Friday 8am- 5pm  After hours call [phone]

## 2021-02-17 ENCOUNTER — Encounter: Payer: Self-pay | Admitting: Adult Health

## 2021-02-17 ENCOUNTER — Non-Acute Institutional Stay (SKILLED_NURSING_FACILITY): Payer: PPO | Admitting: Adult Health

## 2021-02-17 ENCOUNTER — Other Ambulatory Visit: Payer: Self-pay | Admitting: Adult Health

## 2021-02-17 DIAGNOSIS — I5022 Chronic systolic (congestive) heart failure: Secondary | ICD-10-CM | POA: Diagnosis not present

## 2021-02-17 DIAGNOSIS — I110 Hypertensive heart disease with heart failure: Secondary | ICD-10-CM

## 2021-02-17 DIAGNOSIS — L03116 Cellulitis of left lower limb: Secondary | ICD-10-CM

## 2021-02-17 DIAGNOSIS — I4821 Permanent atrial fibrillation: Secondary | ICD-10-CM

## 2021-02-17 MED ORDER — HYDROCODONE-ACETAMINOPHEN 10-325 MG PO TABS: 1.0000 | ORAL_TABLET | Freq: Four times a day (QID) | ORAL | 0 refills | Status: AC | PRN

## 2021-02-17 MED ORDER — PANTOPRAZOLE SODIUM 40 MG PO TBEC: DELAYED_RELEASE_TABLET | ORAL | 0 refills | Status: DC

## 2021-02-17 MED ORDER — CLOPIDOGREL BISULFATE 75 MG PO TABS: ORAL_TABLET | ORAL | 0 refills | Status: DC

## 2021-02-17 MED ORDER — CLOBETASOL PROPIONATE 0.05 % EX CREA: 1.0000 "application " | TOPICAL_CREAM | Freq: Every day | CUTANEOUS | 0 refills | Status: DC | PRN

## 2021-02-17 MED ORDER — TRIAMCINOLONE ACETONIDE 0.1 % EX CREA: 1.0000 "application " | TOPICAL_CREAM | Freq: Every day | CUTANEOUS | 0 refills | Status: DC | PRN

## 2021-02-17 MED ORDER — PRAVASTATIN SODIUM 20 MG PO TABS: ORAL_TABLET | ORAL | 0 refills | Status: DC

## 2021-02-17 MED ORDER — LOSARTAN POTASSIUM 100 MG PO TABS: ORAL_TABLET | ORAL | 0 refills | Status: DC

## 2021-02-17 MED ORDER — FUROSEMIDE 40 MG PO TABS: ORAL_TABLET | ORAL | 0 refills | Status: DC

## 2021-02-17 MED ORDER — POTASSIUM CHLORIDE CRYS ER 20 MEQ PO TBCR: EXTENDED_RELEASE_TABLET | ORAL | 0 refills | Status: DC

## 2021-02-17 MED ORDER — SILVER SULFADIAZINE 1 % EX CREA: 1.0000 "application " | TOPICAL_CREAM | Freq: Two times a day (BID) | CUTANEOUS | 0 refills | Status: DC

## 2021-02-17 MED ORDER — MECLIZINE HCL 25 MG PO TABS: ORAL_TABLET | ORAL | 0 refills | Status: DC

## 2021-02-17 NOTE — Progress Notes (Signed)
Location:   [facility] Room Number: 132 Place of Service:  SNF (31)    CODE STATUS: full code   Allergies  Allergen Reactions  . Contrast Media [Iodinated Diagnostic Agents]     Pt states he was given "dye" 20 yrs ago, anaphylaxis & syncope - was told to never have it again   . Gabapentin Itching  . Aspirin Other (See Comments)    Blistering   . Latex Rash  . Lipitor [Atorvastatin] Itching  . Tape Rash    EKG leads    Chief Complaint  Patient presents with  . Discharge Note    HPI:  He is being discharged to home with home health for pt/ot. He does not need any dme. He will need his prescriptions written and will need to follow up with his medical provider. He is going home due to covid patients in the facility. He was admitted to the hospital for left lower extremity cellulitis. He was admitted to this facility for short term rehab and wound management. He has participated in pt/ot. He has wound care as directed. He is ready for home health therapy.   Past Medical History:  Diagnosis Date  . Anemia    04/2012: H&H-10.2/31.5, MCV-77; 06/2012:12/38.9  . Arthritis   . Benign prostatic hypertrophy    s/p transurethral resection of the prostate  . Chest pain    2004; with dyspnea, stress nuclear-normal EF + questionable inferior wall ischemia, normal coronary angiography;  2010-negative stress nuclear  . CVA (cerebral infarction)    asymptomatic (seen on CT)  . Diarrhea    h/o Hemoccult-positive stool  . Dysrhythmia   . Edema of both legs   . GERD (gastroesophageal reflux disease)   . History of blood transfusion   . History of kidney stones   . History of urinary tract infection   . Hypertension   . Jerking movements of extremities    left arm   . Low back pain   . Obstructive sleep apnea    pt states can not use the CPAP  . Peripheral neuropathy    lower legs and feet bilat   . Persistent atrial fibrillation ([place]) 2013   Asymptomatic;  diagnosed in 05/2012  . Pneumonia   . Prostate cancer ([place]) 2017   stage 4  . Shortness of breath dyspnea    pt states only develops SOB if tries to do something too quickly  . Upper GI bleed 1985   1985-peptic ulcer disease; initial hemigastrectomy; subsequent Billroth II  . Urinary hesitancy   . Vertigo    ED evaluation-06/2012    Past Surgical History:  Procedure Laterality Date  . Billroth II    . CHOLECYSTECTOMY    . COLONOSCOPY  Approximately 2000  . COLONOSCOPY  10/2012   Colonoscopy March 2014 at [facility], difficulty exam requiring fluoroscopy and overtube. Patient developed severe bradycardia again during his procedure like he did [facility]. Multiple polyps removed which were tubular adenomas. Previously tattooed sites at 20 and 30 cm were free of any polypoid change. Left-sided diverticulosis noted.  . COLONOSCOPY WITH ESOPHAGOGASTRODUODENOSCOPY (EGD)  06-17-2009   WRU:[id number] tubular adenomas and 2 tubulovillous adenomas, incomplete, 3 clips placed  . ESOPHAGOGASTRODUODENOSCOPY  06/17/2009   SLF: normal/chronic gastritis (non-h pylori)  . ESOPHAGOGASTRODUODENOSCOPY N/A 05/11/2015   XBJ:[id number] anemai due to postgastrectomy state/moderate erosive gastritis  . INGUINAL HERNIA REPAIR     Left  . [place]  Bilateral  . TRANSURETHRAL RESECTION OF PROSTATE  06/2016  . TRANSURETHRAL RESECTION OF PROSTATE N/A 12/27/2012   Procedure: TRANSURETHRAL RESECTION OF THE PROSTATE (TURP);  Surgeon: Marissa Nestle, MD;  Location: [facility];  Service: Urology;  Laterality: N/A;  . TRANSURETHRAL RESECTION OF PROSTATE N/A 04/11/2016   Procedure: TRANSURETHRAL RESECTION OF THE PROSTATE (TURP);  Surgeon: Irine Seal, MD;  Location: [facility];  Service: Urology;  Laterality: N/A;  . VAGOTOMY AND PYLOROPLASTY  1970s   Details of procedure uncertain; 1308M    Social History   Socioeconomic History  . Marital status: Married    Spouse name: [name]  . Number of children: 5  .  Years of education: Not on file  . Highest education level: Not on file  Occupational History  . Occupation: Retired Pensions consultant: RETIRED  Tobacco Use  . Smoking status: Former    Pack years: 0.00    Types: Cigars    Quit date: 08/22/2003    Years since quitting: 17.5  . Smokeless tobacco: Never  . Tobacco comments:    Quit many years ago; few cigars per day; didn't inhale  Vaping Use  . Vaping Use: Never used  Substance and Sexual Activity  . Alcohol use: No    Alcohol/week: 1.0 standard drink    Types: 1 Standard drinks or equivalent per week  . Drug use: No  . Sexual activity: Not on file  Other Topics Concern  . Not on file  Social History Narrative   Lives w/ wife in [place]   Retired truck Geophysicist/field seismologist   Social Determinants of Radio broadcast assistant Strain: Not on file  Food Insecurity: Not on file  Transportation Needs: Not on file  Physical Activity: Not on file  Stress: Not on file  Social Connections: Not on file  Intimate Partner Violence: Not on file   Family History  Problem Relation Age of Onset  . Diabetes Mellitus II Mother   . Allergic rhinitis Mother   . Lung disease Father   . Allergic rhinitis Father   . Angioedema Neg Hx   . Asthma Neg Hx   . Atopy Neg Hx   . Eczema Neg Hx   . Immunodeficiency Neg Hx   . Urticaria Neg Hx   . Cystic fibrosis Neg Hx   . Lupus Neg Hx   . Emphysema Neg Hx   . Migraines Neg Hx     VITAL SIGNS BP 108/60   Pulse 70   Temp 97.8 F (36.6 C)   Patient's Medications  New Prescriptions   No medications on file  Previous Medications   CLOBETASOL CREAM (TEMOVATE) 0.05 %    Apply 1 application topically daily as needed (use as directed for skin).   CLOPIDOGREL (PLAVIX) 75 MG TABLET    TAKE 1 TABLET(75 MG) BY MOUTH DAILY   FUROSEMIDE (LASIX) 40 MG TABLET    Take 1 and 1/2 tabs in the AM and 1 tab in the afternoon   HYDROCODONE-ACETAMINOPHEN (NORCO) 10-325 MG TABLET    Take 1 tablet by mouth  every 6 (six) hours as needed for up to 7 days.   LOSARTAN (COZAAR) 100 MG TABLET    TAKE 1 TABLET(100 MG) BY MOUTH DAILY   MECLIZINE (ANTIVERT) 25 MG TABLET    TAKE 1 TABLET(25 MG) BY MOUTH AT BEDTIME   NON FORMULARY    Diet:Regular, NAS   NYSTATIN POWDER    APPLY TO THE AFFECTED AREA TOPICALLY  EVERY DAY AS NEEDED FOR RASH   PANTOPRAZOLE (PROTONIX) 40 MG TABLET    TAKE 1 TABLET(40 MG) BY MOUTH DAILY   POTASSIUM CHLORIDE SA (KLOR-CON) 20 MEQ TABLET    TAKE 1 TABLET(20 MEQ) BY MOUTH DAILY   PRAVASTATIN (PRAVACHOL) 20 MG TABLET    TAKE 1 TABLET(20 MG) BY MOUTH DAILY   SILVER SULFADIAZINE (SILVADENE) 1 % CREAM    Apply 1 application topically 2 (two) times daily. Special Instructions: Clean left lower leg with NS. Apply silvadene. Cover with gauze, wrap with kling, cover with an ace wrap   TRIAMCINOLONE CREAM (KENALOG) 0.1 %    Apply 1 application topically daily as needed (for irritation).  Modified Medications   No medications on file  Discontinued Medications   No medications on file     SIGNIFICANT DIAGNOSTIC EXAMS   LABS REVIEWED: PREVIOUS   02-01-21: wbc 6.6; hgb 10.7 hct 34.9; mcv 90.9 plt 266; glucose 134; bun 13; creat 0.85; k+ 4.3; na++ 139; ca 8.8; GFR>60; liver normal albumin 3.2 02-05-21: wbc 8.9; hgb 12.1; hct 39.3; mcv 88.9 plt 271; glucose 130; bun 16; creat 0.98; k+ 3.8; na++ 135; ca 8.8; GFR>60  NO NEW LABS.   Review of Systems  Constitutional:  Negative for malaise/fatigue.  Respiratory:  Negative for cough and shortness of breath.   Cardiovascular:  Negative for chest pain, palpitations and leg swelling.  Gastrointestinal:  Negative for abdominal pain, constipation and heartburn.  Musculoskeletal:  Negative for back pain, joint pain and myalgias.  Skin: Negative.   Neurological:  Negative for dizziness.  Psychiatric/Behavioral:  The patient is not nervous/anxious.    Physical Exam Constitutional:      General: He is not in acute distress.    Appearance: He is  well-developed. He is not diaphoretic.  Neck:     Thyroid: No thyromegaly.  Cardiovascular:     Rate and Rhythm: Normal rate and regular rhythm.     Pulses: Normal pulses.     Heart sounds: Murmur heard.  Pulmonary:     Effort: Pulmonary effort is normal. No respiratory distress.     Breath sounds: Normal breath sounds.  Abdominal:     General: Bowel sounds are normal. There is no distension.     Palpations: Abdomen is soft.     Tenderness: There is no abdominal tenderness.  Musculoskeletal:        General: Normal range of motion.     Cervical back: Neck supple.     Right lower leg: Edema present.     Left lower leg: Edema present.  Lymphadenopathy:     Cervical: No cervical adenopathy.  Skin:    General: Skin is warm and dry.     Comments: Left lower leg ulceration: leg is wrapped   Neurological:     Mental Status: He is alert and oriented to person, place, and time.  Psychiatric:        Mood and Affect: Mood normal.     ASSESSMENT/ PLAN:   Patient is being discharged with the following home health services:  pt/ot to evaluate and treat as indicated for gait balance strength adl training.   Patient is being discharged with the following durable medical equipment:  none needed   Patient has been advised to f/u with their PCP in 1-2 weeks to bring them up to date on their rehab stay.  Social services at facility was responsible for arranging this appointment.  Pt was provided with a 30 day supply of  prescriptions for medications and refills must be obtained from their PCP.  For controlled substances, a more limited supply may be provided adequate until PCP appointment only.  A 30 day supply of his prescription medications with #10 vicodin 10/325 mg tabs sent to [facility] on [address].   Time spent with patient 35 minutes: medications; home health needs; dme.   Ok Edwards NP [facility]  Contact [phone] Monday through Friday 8am- 5pm  After hours  call [phone]

## 2021-02-18 DIAGNOSIS — G4733 Obstructive sleep apnea (adult) (pediatric): Secondary | ICD-10-CM | POA: Diagnosis not present

## 2021-02-18 DIAGNOSIS — S81812D Laceration without foreign body, left lower leg, subsequent encounter: Secondary | ICD-10-CM | POA: Diagnosis not present

## 2021-02-18 DIAGNOSIS — L03116 Cellulitis of left lower limb: Secondary | ICD-10-CM | POA: Diagnosis not present

## 2021-02-18 DIAGNOSIS — I4819 Other persistent atrial fibrillation: Secondary | ICD-10-CM | POA: Diagnosis not present

## 2021-02-18 DIAGNOSIS — I110 Hypertensive heart disease with heart failure: Secondary | ICD-10-CM | POA: Insufficient documentation

## 2021-02-18 DIAGNOSIS — K219 Gastro-esophageal reflux disease without esophagitis: Secondary | ICD-10-CM | POA: Insufficient documentation

## 2021-02-18 DIAGNOSIS — E876 Hypokalemia: Secondary | ICD-10-CM | POA: Insufficient documentation

## 2021-02-18 DIAGNOSIS — Z8546 Personal history of malignant neoplasm of prostate: Secondary | ICD-10-CM | POA: Diagnosis not present

## 2021-02-18 DIAGNOSIS — E785 Hyperlipidemia, unspecified: Secondary | ICD-10-CM | POA: Insufficient documentation

## 2021-02-18 DIAGNOSIS — Z7902 Long term (current) use of antithrombotics/antiplatelets: Secondary | ICD-10-CM | POA: Diagnosis not present

## 2021-02-18 DIAGNOSIS — E782 Mixed hyperlipidemia: Secondary | ICD-10-CM | POA: Insufficient documentation

## 2021-02-18 DIAGNOSIS — W19XXXD Unspecified fall, subsequent encounter: Secondary | ICD-10-CM | POA: Diagnosis not present

## 2021-02-18 DIAGNOSIS — Z79891 Long term (current) use of opiate analgesic: Secondary | ICD-10-CM | POA: Diagnosis not present

## 2021-02-18 DIAGNOSIS — Z9181 History of falling: Secondary | ICD-10-CM | POA: Diagnosis not present

## 2021-02-18 DIAGNOSIS — Z8673 Personal history of transient ischemic attack (TIA), and cerebral infarction without residual deficits: Secondary | ICD-10-CM | POA: Diagnosis not present

## 2021-02-19 ENCOUNTER — Other Ambulatory Visit: Payer: Self-pay | Admitting: Adult Health

## 2021-02-22 ENCOUNTER — Telehealth: Payer: Self-pay | Admitting: *Deleted

## 2021-02-22 NOTE — Telephone Encounter (Signed)
Received call from [place] [facility] SN with [place] [phone]~ telephone.   Requested VO for [facility] SN for wound care and S/P hospitalization 2x weekly x3 weeks, then 1x weekly x3 weeks. VO given.   Reports that wound care to [facility] involves daily dressing changes with silvadene, guaze, ABD pad, Kerlex and Ace bandage. Reports that wound appears to have slough and may benefit from [place], but will leave that to PCP discretion. Reports that patient has F/U appt on 02/28/2021.

## 2021-02-24 ENCOUNTER — Telehealth: Payer: Self-pay

## 2021-02-24 NOTE — Telephone Encounter (Signed)
Spoke with Marcella Dubs with [facility]. Requesting verbal for 1wk1, 2wk2, 2wk2, approval given.

## 2021-02-25 DIAGNOSIS — S81802A Unspecified open wound, left lower leg, initial encounter: Secondary | ICD-10-CM | POA: Diagnosis not present

## 2021-02-28 ENCOUNTER — Other Ambulatory Visit: Payer: Self-pay

## 2021-02-28 ENCOUNTER — Ambulatory Visit (INDEPENDENT_AMBULATORY_CARE_PROVIDER_SITE_OTHER): Payer: PPO | Admitting: Family Medicine

## 2021-02-28 VITALS — BP 122/80 | HR 92 | Temp 98.2°F | Ht 72.0 in | Wt 235.0 lb

## 2021-02-28 DIAGNOSIS — S81802S Unspecified open wound, left lower leg, sequela: Secondary | ICD-10-CM

## 2021-02-28 DIAGNOSIS — L03116 Cellulitis of left lower limb: Secondary | ICD-10-CM

## 2021-02-28 DIAGNOSIS — S81802D Unspecified open wound, left lower leg, subsequent encounter: Secondary | ICD-10-CM | POA: Diagnosis not present

## 2021-02-28 MED ORDER — SILVER SULFADIAZINE 1 % EX CREA: 1.0000 "application " | TOPICAL_CREAM | Freq: Every day | CUTANEOUS | 0 refills | Status: DC

## 2021-02-28 NOTE — Progress Notes (Signed)
Subjective:    Patient ID: Bernard Ramirez, male    DOB: 03/12/1931, 87 y.o.   MRN: [id number] Patient is an 87 year old Caucasian male who is here today for hospital follow-up.  When I last saw the patient I sent him to the hospital for an open wound on his left lower extremity and secondary cellulitis.  He was treated in the hospital with 10 days of Keflex which he has completed.  The erythema around the wound in his left lower leg looks dramatically better.  While hospitalized, he was aggressively diuresed. Wt Readings from Last 3 Encounters:  02/28/21 235 lb (106.6 kg)  02/16/21 231 lb (104.8 kg)  02/05/21 231 lb 7.7 oz (105 kg)   When I last saw the patient in April he was 244 pounds.  He is down to around 230 since going to the hospital.  He also received extensive physical therapy in a nursing home.  Previously he was confined to a wheelchair.  He is now walking with a walker and receiving physical therapy at home.  There is still an open wound on his left shin.  I removed 4 stitches today that been placed ever since June 4 when he originally fell.  The wound is irregularly shaped but is approximately 1.5 cm in width and about 5 inches in length.  I covered this with Silvadene, nonadherent gauze, and wrapped with Coban.  He is getting daily dressing changes with a similar technique at home and has a wound care nurse coming out twice a week to evaluate.  He denies any pain at the site and there is no purulent drainage. Past Medical History:  Diagnosis Date   Anemia    04/2012: H&H-10.2/31.5, MCV-77; 06/2012:12/38.9   Arthritis    Benign prostatic hypertrophy    s/p transurethral resection of the prostate   Chest pain    2004; with dyspnea, stress nuclear-normal EF + questionable inferior wall ischemia, normal coronary angiography;  2010-negative stress nuclear   CVA (cerebral infarction)    asymptomatic (seen on CT)   Diarrhea    h/o Hemoccult-positive stool   Dysrhythmia    Edema of  both legs    GERD (gastroesophageal reflux disease)    History of blood transfusion    History of kidney stones    History of urinary tract infection    Hypertension    Jerking movements of extremities    left arm    Low back pain    Obstructive sleep apnea    pt states can not use the CPAP   Peripheral neuropathy    lower legs and feet bilat    Persistent atrial fibrillation ([place]) 2013   Asymptomatic; diagnosed in 05/2012   Pneumonia    Prostate cancer ([place]) 2017   stage 4   Shortness of breath dyspnea    pt states only develops SOB if tries to do something too quickly   Upper GI bleed 1985   1985-peptic ulcer disease; initial hemigastrectomy; subsequent Billroth II   Urinary hesitancy    Vertigo    ED evaluation-06/2012   Past Surgical History:  Procedure Laterality Date   Billroth II     CHOLECYSTECTOMY     COLONOSCOPY  Approximately 2000   COLONOSCOPY  10/2012   Colonoscopy March 2014 at [facility], difficulty exam requiring fluoroscopy and overtube. Patient developed severe bradycardia again during his procedure like he did [facility]. Multiple polyps removed which were tubular adenomas. Previously tattooed sites at 31 and  30 cm were free of any polypoid change. Left-sided diverticulosis noted.   COLONOSCOPY WITH ESOPHAGOGASTRODUODENOSCOPY (EGD)  06-17-2009   EGB:TDVVOHYW tubular adenomas and 2 tubulovillous adenomas, incomplete, 3 clips placed   ESOPHAGOGASTRODUODENOSCOPY  06/17/2009   SLF: normal/chronic gastritis (non-h pylori)   ESOPHAGOGASTRODUODENOSCOPY N/A 05/11/2015   VPX:TGGYIRS anemai due to postgastrectomy state/moderate erosive gastritis   INGUINAL HERNIA REPAIR     Left   ROTATOR CUFF REPAIR  1991   Bilateral   TRANSURETHRAL RESECTION OF PROSTATE  06/2016   TRANSURETHRAL RESECTION OF PROSTATE N/A 12/27/2012   Procedure: TRANSURETHRAL RESECTION OF THE PROSTATE (TURP);  Surgeon: Marissa Nestle, MD;  Location: [facility];  Service: Urology;  Laterality: N/A;    TRANSURETHRAL RESECTION OF PROSTATE N/A 04/11/2016   Procedure: TRANSURETHRAL RESECTION OF THE PROSTATE (TURP);  Surgeon: Irine Seal, MD;  Location: [facility];  Service: Urology;  Laterality: N/A;   VAGOTOMY AND PYLOROPLASTY  1970s   Details of procedure uncertain; 8546E   Current Outpatient Medications on File Prior to Visit  Medication Sig Dispense Refill   clobetasol cream (TEMOVATE) 7.03 % Apply 1 application topically daily as needed (use as directed for skin). 30 g 0   clopidogrel (PLAVIX) 75 MG tablet TAKE 1 TABLET(75 MG) BY MOUTH DAILY 30 tablet 0   furosemide (LASIX) 40 MG tablet Take 1 and 1/2 tabs in the AM and 1 tab in the afternoon 90 tablet 0   losartan (COZAAR) 100 MG tablet TAKE 1 TABLET(100 MG) BY MOUTH DAILY 30 tablet 0   meclizine (ANTIVERT) 25 MG tablet TAKE 1 TABLET(25 MG) BY MOUTH AT BEDTIME 30 tablet 0   NON FORMULARY Diet:Regular, NAS     NYSTATIN powder APPLY TO THE AFFECTED AREA TOPICALLY EVERY DAY AS NEEDED FOR RASH 60 g 3   pantoprazole (PROTONIX) 40 MG tablet TAKE 1 TABLET(40 MG) BY MOUTH DAILY 30 tablet 0   potassium chloride SA (KLOR-CON) 20 MEQ tablet TAKE 1 TABLET(20 MEQ) BY MOUTH DAILY 30 tablet 0   pravastatin (PRAVACHOL) 20 MG tablet TAKE 1 TABLET(20 MG) BY MOUTH DAILY 30 tablet 0   silver sulfADIAZINE (SILVADENE) 1 % cream Apply 1 application topically 2 (two) times daily. Special Instructions: Clean left lower leg with NS. Apply silvadene. Cover with gauze, wrap with kling, cover with an ace wrap 50 g 0   triamcinolone cream (KENALOG) 0.1 % Apply 1 application topically daily as needed (for irritation). 30 g 0   No current facility-administered medications on file prior to visit.   Allergies  Allergen Reactions   Contrast Media [Iodinated Diagnostic Agents]     Pt states he was given "dye" 20 yrs ago, anaphylaxis & syncope - was told to never have it again    Gabapentin Itching   Aspirin Other (See Comments)    Blistering    Latex Rash   Lipitor  [Atorvastatin] Itching   Tape Rash    EKG leads   Social History   Socioeconomic History   Marital status: Married    Spouse name: [name]   Number of children: 5   Years of education: Not on file   Highest education level: Not on file  Occupational History   Occupation: Retired Pensions consultant: RETIRED  Tobacco Use   Smoking status: Former    Pack years: 0.00    Types: Cigars    Quit date: 08/22/2003    Years since quitting: 17.5   Smokeless tobacco: Never   Tobacco comments:  Quit many years ago; few cigars per day; didn't inhale  Vaping Use   Vaping Use: Never used  Substance and Sexual Activity   Alcohol use: No    Alcohol/week: 1.0 standard drink    Types: 1 Standard drinks or equivalent per week   Drug use: No   Sexual activity: Not on file  Other Topics Concern   Not on file  Social History Narrative   Lives w/ wife in [place]   Retired truck Geophysicist/field seismologist   Social Determinants of Radio broadcast assistant Strain: Not on file  Food Insecurity: Not on file  Transportation Needs: Not on file  Physical Activity: Not on file  Stress: Not on file  Social Connections: Not on file  Intimate Partner Violence: Not on file      Review of Systems  All other systems reviewed and are negative.     Objective:   Physical Exam Vitals reviewed.  Constitutional:      General: He is not in acute distress.    Appearance: Normal appearance. He is obese. He is not ill-appearing or toxic-appearing.  Cardiovascular:     Rate and Rhythm: Normal rate. Rhythm irregular.     Heart sounds: Normal heart sounds. No murmur heard.   No friction rub. No gallop.  Pulmonary:     Effort: Pulmonary effort is normal. No respiratory distress.     Breath sounds: Normal breath sounds. No stridor. No wheezing, rhonchi or rales.  Abdominal:     General: Abdomen is flat. Bowel sounds are normal. There is no distension.     Palpations: Abdomen is soft. There is no mass.      Tenderness: There is no abdominal tenderness. There is no guarding.  Musculoskeletal:     Right lower leg: Pitting Edema present.     Left lower leg: Deformity and laceration present. Pitting Edema present.     Right foot: Decreased range of motion. Swelling and deformity present.       Legs:  Skin:    Findings: No erythema.  Neurological:     General: No focal deficit present.     Mental Status: He is alert and oriented to person, place, and time.     Cranial Nerves: No cranial nerve deficit.     Sensory: No sensory deficit.     Motor: No weakness.     Coordination: Coordination normal.     Gait: Gait abnormal (Shuffling gait.  Requires a cane to ambulate).     Deep Tendon Reflexes: Reflexes normal.  Psychiatric:        Mood and Affect: Mood normal.        Thought Content: Thought content normal.        Judgment: Judgment normal.          Assessment & Plan:  Left leg cellulitis  Open wound of left lower extremity, sequela Cellulitis has resolved.  I recommended daily dressing changes with Silvadene cream, nonadherent gauze, and Coban.  Recheck in 2 weeks.  At that time we will be due for fasting lab work.  Continue Lasix.  Wife has been giving him 80 mg in the morning rather than the way as prescribed on his medication list.  I am okay with him continuing to do this as this seems to be working well keeping him euvolemic.  However if he gains additional fluid by the time I see him back we will need to uptitrate his diuretic.

## 2021-03-01 ENCOUNTER — Other Ambulatory Visit: Payer: Self-pay | Admitting: Family Medicine

## 2021-03-01 MED ORDER — HYDROCODONE-ACETAMINOPHEN 10-325 MG PO TABS: 1.0000 | ORAL_TABLET | Freq: Four times a day (QID) | ORAL | 0 refills | Status: DC | PRN

## 2021-03-01 NOTE — Telephone Encounter (Signed)
Request sent to pcp.

## 2021-03-01 NOTE — Telephone Encounter (Signed)
Patient needs refill of   HYDROcodone-acetaminophen (NORCO) 10-325 MG tablet [[id number]]  Pharmacy confirmed as  [facility] [place], [address] [place], [place] [address]  Phone:  [phone]  Fax:  [phone]  DEA #:  [id number]   Please advise at [phone]

## 2021-03-02 DIAGNOSIS — S81802A Unspecified open wound, left lower leg, initial encounter: Secondary | ICD-10-CM | POA: Diagnosis not present

## 2021-03-03 ENCOUNTER — Other Ambulatory Visit: Payer: Self-pay | Admitting: *Deleted

## 2021-03-03 ENCOUNTER — Telehealth: Payer: Self-pay | Admitting: Family Medicine

## 2021-03-03 MED ORDER — HYDROCODONE-ACETAMINOPHEN 10-325 MG PO TABS: 1.0000 | ORAL_TABLET | Freq: Four times a day (QID) | ORAL | 0 refills | Status: DC | PRN

## 2021-03-03 NOTE — Telephone Encounter (Signed)
Pt's spouse came in wanting to find out why pt was only prescribed a quantity of 15 HYDROcodone-acetaminophen (NORCO) 10-325 MG tablet when it is usually 150. Please advise  Cb#: [phone]

## 2021-03-03 NOTE — Telephone Encounter (Signed)
It appears that prescription was incorrectly sent to pharmacy.   Correction requested on 03/03/2021.

## 2021-03-03 NOTE — Telephone Encounter (Signed)
Received fax requesting clarification of Hydrocodone/APAP prescription.   Patient has been receiving #150 tabs Q month since 101/2020. Last prescription sent to pharmacy was for #15 tabs.   Please advise and approve refill if appropriate.

## 2021-03-04 ENCOUNTER — Other Ambulatory Visit: Payer: Self-pay | Admitting: *Deleted

## 2021-03-04 MED ORDER — HYDROCODONE-ACETAMINOPHEN 10-325 MG PO TABS: 1.0000 | ORAL_TABLET | ORAL | 0 refills | Status: DC | PRN

## 2021-03-04 NOTE — Telephone Encounter (Signed)
Received call from pharmacy.   SIG for pain medication stated (for 4 days).   Requested to have this removed and prescription re-sent.

## 2021-03-09 ENCOUNTER — Telehealth: Payer: Self-pay | Admitting: Family Medicine

## 2021-03-09 NOTE — Telephone Encounter (Signed)
Received call from [facility] at [facility]; requesting to speak with nurse to put in home health orders. Needs to continue seeing patient for wound care (once a week for 5 weeks starting next week). Please advise at [phone]

## 2021-03-09 NOTE — Telephone Encounter (Signed)
Returned call to [facility] with [place] [phone] telephone.   Requested orders for wound care 1x weekly x5 weeks for LLE wound. Reports that they are cleaning eith normal saline, applying silvadene and covering with dry dressing and Kerlix.   VO given.

## 2021-03-14 ENCOUNTER — Ambulatory Visit (INDEPENDENT_AMBULATORY_CARE_PROVIDER_SITE_OTHER): Payer: PPO | Admitting: Family Medicine

## 2021-03-14 ENCOUNTER — Other Ambulatory Visit: Payer: Self-pay

## 2021-03-14 ENCOUNTER — Encounter: Payer: Self-pay | Admitting: Family Medicine

## 2021-03-14 VITALS — BP 132/64 | HR 60 | Temp 98.3°F | Resp 14 | Ht 72.0 in | Wt 242.0 lb

## 2021-03-14 DIAGNOSIS — E118 Type 2 diabetes mellitus with unspecified complications: Secondary | ICD-10-CM | POA: Diagnosis not present

## 2021-03-14 DIAGNOSIS — I5020 Unspecified systolic (congestive) heart failure: Secondary | ICD-10-CM

## 2021-03-14 DIAGNOSIS — I10 Essential (primary) hypertension: Secondary | ICD-10-CM | POA: Diagnosis not present

## 2021-03-14 DIAGNOSIS — S81802S Unspecified open wound, left lower leg, sequela: Secondary | ICD-10-CM | POA: Diagnosis not present

## 2021-03-14 DIAGNOSIS — M7989 Other specified soft tissue disorders: Secondary | ICD-10-CM | POA: Diagnosis not present

## 2021-03-14 MED ORDER — CLOBETASOL PROPIONATE 0.05 % EX SOLN: 1.0000 "application " | Freq: Two times a day (BID) | CUTANEOUS | 2 refills | Status: DC

## 2021-03-14 MED ORDER — CEPHALEXIN 500 MG PO CAPS: 500.0000 mg | ORAL_CAPSULE | Freq: Three times a day (TID) | ORAL | 0 refills | Status: DC

## 2021-03-14 NOTE — Progress Notes (Signed)
Subjective:    Patient ID: Bernard Ramirez, male    DOB: 03/26/1931, 87 y.o.   MRN: [id number] Patient is an 87 year old Caucasian male who is here today for hospital follow-up.  When I last saw the patient I sent him to the hospital for an open wound on his left lower extremity and secondary cellulitis.  He was treated in the hospital with 10 days of Keflex which he has completed.  The erythema around the wound in his left lower leg looks dramatically better.  While hospitalized, he was aggressively diuresed. Wt Readings from Last 3 Encounters:  03/14/21 242 lb (109.8 kg)  02/28/21 235 lb (106.6 kg)  02/16/21 231 lb (104.8 kg)  7/11 When I last saw the patient in April he was 244 pounds.  He is down to around 230 since going to the hospital.  He also received extensive physical therapy in a nursing home.  Previously he was confined to a wheelchair.  He is now walking with a walker and receiving physical therapy at home.  There is still an open wound on his left shin.  I removed 4 stitches today that been placed ever since June 4 when he originally fell.  The wound is irregularly shaped but is approximately 1.5 cm in width and about 5 inches in length.  I covered this with Silvadene, nonadherent gauze, and wrapped with Coban.  He is getting daily dressing changes with a similar technique at home and has a wound care nurse coming out twice a week to evaluate.  He denies any pain at the site and there is no purulent drainage.  At that time, my plan was: Cellulitis has resolved.  I recommended daily dressing changes with Silvadene cream, nonadherent gauze, and Coban.  Recheck in 2 weeks.  At that time we will be due for fasting lab work.  Continue Lasix.  Wife has been giving him 80 mg in the morning rather than the way as prescribed on his medication list.  I am okay with him continuing to do this as this seems to be working well keeping him euvolemic.  However if he gains additional fluid by the time I  see him back we will need to uptitrate his diuretic.  03/14/21 The wound has not improved at all.  There is yellow slough in the base of the wound however is extremely shallow.  The wound is still approximately 10 mm wide but extends for 5 inches.  The skin around the wound is slightly erythematous but he denies any pain.  There is mild warmth to touch.  There is extensive swelling in his legs bilaterally.  His weight is up 7 pounds since his last visit.  He also has bibasilar crackles in both lungs and he is starting to appear fluid overloaded.  He is also due to recheck his blood sugar.  He denies any chest pain or shortness of breath however he is becoming more sedentary since being at home and is not doing as many exercises.  As result he appears slightly weaker. Past Medical History:  Diagnosis Date   Anemia    04/2012: H&H-10.2/31.5, MCV-77; 06/2012:12/38.9   Arthritis    Benign prostatic hypertrophy    s/p transurethral resection of the prostate   Chest pain    2004; with dyspnea, stress nuclear-normal EF + questionable inferior wall ischemia, normal coronary angiography;  2010-negative stress nuclear   CVA (cerebral infarction)    asymptomatic (seen on CT)   Diarrhea  h/o Hemoccult-positive stool   Dysrhythmia    Edema of both legs    GERD (gastroesophageal reflux disease)    History of blood transfusion    History of kidney stones    History of urinary tract infection    Hypertension    Jerking movements of extremities    left arm    Low back pain    Obstructive sleep apnea    pt states can not use the CPAP   Peripheral neuropathy    lower legs and feet bilat    Persistent atrial fibrillation ([place]) 2013   Asymptomatic; diagnosed in 05/2012   Pneumonia    Prostate cancer ([place]) 2017   stage 4   Shortness of breath dyspnea    pt states only develops SOB if tries to do something too quickly   Upper GI bleed 1985   1985-peptic ulcer disease; initial hemigastrectomy;  subsequent Billroth II   Urinary hesitancy    Vertigo    ED evaluation-06/2012   Past Surgical History:  Procedure Laterality Date   Billroth II     CHOLECYSTECTOMY     COLONOSCOPY  Approximately 2000   COLONOSCOPY  10/2012   Colonoscopy March 2014 at [facility], difficulty exam requiring fluoroscopy and overtube. Patient developed severe bradycardia again during his procedure like he did [facility]. Multiple polyps removed which were tubular adenomas. Previously tattooed sites at 20 and 30 cm were free of any polypoid change. Left-sided diverticulosis noted.   COLONOSCOPY WITH ESOPHAGOGASTRODUODENOSCOPY (EGD)  06-17-2009   RCV:ELFYBOFB tubular adenomas and 2 tubulovillous adenomas, incomplete, 3 clips placed   ESOPHAGOGASTRODUODENOSCOPY  06/17/2009   SLF: normal/chronic gastritis (non-h pylori)   ESOPHAGOGASTRODUODENOSCOPY N/A 05/11/2015   PZW:CHENIDP anemai due to postgastrectomy state/moderate erosive gastritis   INGUINAL HERNIA REPAIR     Left   ROTATOR CUFF REPAIR  1991   Bilateral   TRANSURETHRAL RESECTION OF PROSTATE  06/2016   TRANSURETHRAL RESECTION OF PROSTATE N/A 12/27/2012   Procedure: TRANSURETHRAL RESECTION OF THE PROSTATE (TURP);  Surgeon: Marissa Nestle, MD;  Location: [facility];  Service: Urology;  Laterality: N/A;   TRANSURETHRAL RESECTION OF PROSTATE N/A 04/11/2016   Procedure: TRANSURETHRAL RESECTION OF THE PROSTATE (TURP);  Surgeon: Irine Seal, MD;  Location: [facility];  Service: Urology;  Laterality: N/A;   VAGOTOMY AND PYLOROPLASTY  1970s   Details of procedure uncertain; 8242P   Current Outpatient Medications on File Prior to Visit  Medication Sig Dispense Refill   clobetasol cream (TEMOVATE) 5.36 % Apply 1 application topically daily as needed (use as directed for skin). 30 g 0   clopidogrel (PLAVIX) 75 MG tablet TAKE 1 TABLET(75 MG) BY MOUTH DAILY 30 tablet 0   furosemide (LASIX) 40 MG tablet Take 1 and 1/2 tabs in the AM and 1 tab in the afternoon 90 tablet 0    HYDROcodone-acetaminophen (NORCO) 10-325 MG tablet Take 1 tablet by mouth every 4 (four) hours as needed (maxium dose 5 tabs PO QD). Chronic Pain. Dx: G89.4. 150 tablet 0   losartan (COZAAR) 100 MG tablet TAKE 1 TABLET(100 MG) BY MOUTH DAILY 30 tablet 0   meclizine (ANTIVERT) 25 MG tablet TAKE 1 TABLET(25 MG) BY MOUTH AT BEDTIME 30 tablet 0   NON FORMULARY Diet:Regular, NAS     NYSTATIN powder APPLY TO THE AFFECTED AREA TOPICALLY EVERY DAY AS NEEDED FOR RASH 60 g 3   pantoprazole (PROTONIX) 40 MG tablet TAKE 1 TABLET(40 MG) BY MOUTH DAILY 30 tablet 0   potassium  chloride SA (KLOR-CON) 20 MEQ tablet TAKE 1 TABLET(20 MEQ) BY MOUTH DAILY 30 tablet 0   pravastatin (PRAVACHOL) 20 MG tablet TAKE 1 TABLET(20 MG) BY MOUTH DAILY 30 tablet 0   silver sulfADIAZINE (SILVADENE) 1 % cream Apply 1 application topically 2 (two) times daily. Special Instructions: Clean left lower leg with NS. Apply silvadene. Cover with gauze, wrap with kling, cover with an ace wrap 50 g 0   silver sulfADIAZINE (SILVADENE) 1 % cream Apply 1 application topically daily. 400 g 0   triamcinolone cream (KENALOG) 0.1 % Apply 1 application topically daily as needed (for irritation). 30 g 0   No current facility-administered medications on file prior to visit.   Allergies  Allergen Reactions   Contrast Media [Iodinated Diagnostic Agents]     Pt states he was given "dye" 20 yrs ago, anaphylaxis & syncope - was told to never have it again    Gabapentin Itching   Aspirin Other (See Comments)    Blistering    Latex Rash   Lipitor [Atorvastatin] Itching   Tape Rash    EKG leads   Social History   Socioeconomic History   Marital status: Married    Spouse name: [name]   Number of children: 5   Years of education: Not on file   Highest education level: Not on file  Occupational History   Occupation: Retired Pensions consultant: RETIRED  Tobacco Use   Smoking status: Former    Types: Cigars    Quit date: 08/22/2003     Years since quitting: 17.5   Smokeless tobacco: Never   Tobacco comments:    Quit many years ago; few cigars per day; didn't inhale  Vaping Use   Vaping Use: Never used  Substance and Sexual Activity   Alcohol use: No    Alcohol/week: 1.0 standard drink    Types: 1 Standard drinks or equivalent per week   Drug use: No   Sexual activity: Not on file  Other Topics Concern   Not on file  Social History Narrative   Lives w/ wife in [place]   Retired truck Geophysicist/field seismologist   Social Determinants of Radio broadcast assistant Strain: Not on file  Food Insecurity: Not on file  Transportation Needs: Not on file  Physical Activity: Not on file  Stress: Not on file  Social Connections: Not on file  Intimate Partner Violence: Not on file      Review of Systems  All other systems reviewed and are negative.     Objective:   Physical Exam Vitals reviewed.  Constitutional:      General: He is not in acute distress.    Appearance: Normal appearance. He is obese. He is not ill-appearing or toxic-appearing.  Cardiovascular:     Rate and Rhythm: Normal rate. Rhythm irregular.     Heart sounds: Normal heart sounds. No murmur heard.   No friction rub. No gallop.  Pulmonary:     Effort: Pulmonary effort is normal. No respiratory distress.     Breath sounds: No stridor. Examination of the right-lower field reveals rales. Examination of the left-lower field reveals rales. Rales present. No wheezing or rhonchi.  Abdominal:     General: Abdomen is flat. Bowel sounds are normal. There is no distension.     Palpations: Abdomen is soft. There is no mass.     Tenderness: There is no abdominal tenderness. There is no guarding.  Musculoskeletal:     Right lower  leg: Pitting Edema present.     Left lower leg: Deformity and laceration present. Pitting Edema present.     Right foot: Decreased range of motion. Swelling and deformity present.       Legs:  Skin:    Findings: No erythema.   Neurological:     General: No focal deficit present.     Mental Status: He is alert and oriented to person, place, and time.     Cranial Nerves: No cranial nerve deficit.     Sensory: No sensory deficit.     Motor: No weakness.     Coordination: Coordination normal.     Gait: Gait abnormal (Shuffling gait.  Requires a cane to ambulate).     Deep Tendon Reflexes: Reflexes normal.  Psychiatric:        Mood and Affect: Mood normal.        Thought Content: Thought content normal.        Judgment: Judgment normal.          Assessment & Plan:  Controlled type 2 diabetes mellitus with complication, without long-term current use of insulin ([place]) - Plan: COMPLETE METABOLIC PANEL WITH GFR, CBC with Differential/Platelet, Hemoglobin A1c  Open wound of left lower extremity, sequela  Systolic congestive heart failure, unspecified HF chronicity (HCC)  Essential hypertension  Leg swelling Patient is currently taking 80 mg of Lasix in the morning.  I will increase his dose to 80 mg in the morning and 40 mg in the evening.  He does appear to be fluid overloaded today.  Recheck on Thursday.  Obtain a CMP today to monitor renal function and potassium and while checking lab work we will also check an A1c.  Blood pressure today is well controlled at 132/64.  I will reassess his glycemic control by checking an A1c.  His last A1c in April was well controlled.  I started the patient on Keflex 500 mg p.o. 3 times daily for 7 days for cellulitis due to the redness around the wound in his right leg.  Also cover the wound with Silvadene, nonadherent gauze, and the patient was placed in an [place]na boot as I believe that the swelling and the repeated cycles of swelling are causing the wound to heal slowly and poorly.  Recheck the patient every 3 days until healed.  Recheck on Thursday

## 2021-03-15 LAB — CBC WITH DIFFERENTIAL/PLATELET
Absolute Monocytes: 730 cells/uL (ref 200–950)
Basophils Absolute: 23 cells/uL (ref 0–200)
Basophils Relative: 0.3 %
Eosinophils Absolute: 251 cells/uL (ref 15–500)
Eosinophils Relative: 3.3 %
HCT: 35.7 % — ABNORMAL LOW (ref 38.5–50.0)
Hemoglobin: 11.2 g/dL — ABNORMAL LOW (ref 13.2–17.1)
Lymphs Abs: 1482 cells/uL (ref 850–3900)
MCH: 27.5 pg (ref 27.0–33.0)
MCHC: 31.4 g/dL — ABNORMAL LOW (ref 32.0–36.0)
MCV: 87.7 fL (ref 80.0–100.0)
MPV: 11 fL (ref 7.5–12.5)
Monocytes Relative: 9.6 %
Neutro Abs: 5115 cells/uL (ref 1500–7800)
Neutrophils Relative %: 67.3 %
Platelets: 243 10*3/uL (ref 140–400)
RBC: 4.07 10*6/uL — ABNORMAL LOW (ref 4.20–5.80)
RDW: 14.4 % (ref 11.0–15.0)
Total Lymphocyte: 19.5 %
WBC: 7.6 10*3/uL (ref 3.8–10.8)

## 2021-03-15 LAB — COMPLETE METABOLIC PANEL WITH GFR
AG Ratio: 1.6 (calc) (ref 1.0–2.5)
ALT: 6 U/L — ABNORMAL LOW (ref 9–46)
AST: 13 U/L (ref 10–35)
Albumin: 3.9 g/dL (ref 3.6–5.1)
Alkaline phosphatase (APISO): 79 U/L (ref 35–144)
BUN: 15 mg/dL (ref 7–25)
CO2: 30 mmol/L (ref 20–32)
Calcium: 9.1 mg/dL (ref 8.6–10.3)
Chloride: 101 mmol/L (ref 98–110)
Creat: 1.01 mg/dL (ref 0.70–1.22)
Globulin: 2.4 g/dL (calc) (ref 1.9–3.7)
Glucose, Bld: 118 mg/dL — ABNORMAL HIGH (ref 65–99)
Potassium: 4.5 mmol/L (ref 3.5–5.3)
Sodium: 139 mmol/L (ref 135–146)
Total Bilirubin: 1.3 mg/dL — ABNORMAL HIGH (ref 0.2–1.2)
Total Protein: 6.3 g/dL (ref 6.1–8.1)
eGFR: 72 mL/min/{1.73_m2} (ref >=60)

## 2021-03-15 LAB — HEMOGLOBIN A1C
Hgb A1c MFr Bld: 6.6 % of total Hgb — ABNORMAL HIGH (ref <5.7)
Mean Plasma Glucose: 143 mg/dL
eAG (mmol/L): 7.9 mmol/L

## 2021-03-17 ENCOUNTER — Other Ambulatory Visit: Payer: Self-pay

## 2021-03-17 ENCOUNTER — Encounter: Payer: Self-pay | Admitting: Family Medicine

## 2021-03-17 ENCOUNTER — Ambulatory Visit (INDEPENDENT_AMBULATORY_CARE_PROVIDER_SITE_OTHER): Payer: PPO | Admitting: Family Medicine

## 2021-03-17 VITALS — BP 130/74 | HR 80 | Temp 98.2°F | Resp 14 | Ht 72.0 in | Wt 240.8 lb

## 2021-03-17 DIAGNOSIS — S81802D Unspecified open wound, left lower leg, subsequent encounter: Secondary | ICD-10-CM

## 2021-03-17 DIAGNOSIS — S81802S Unspecified open wound, left lower leg, sequela: Secondary | ICD-10-CM

## 2021-03-17 NOTE — Progress Notes (Signed)
Subjective:    Patient ID: Bernard Ramirez, male    DOB: Dec 23, 1930, 87 y.o.   MRN: [id number]  Patient is an 87 year old Caucasian male who is here today for hospital follow-up.  When I last saw the patient I sent him to the hospital for an open wound on his left lower extremity and secondary cellulitis.  He was treated in the hospital with 10 days of Keflex which he has completed.  The erythema around the wound in his left lower leg looks dramatically better.  While hospitalized, he was aggressively diuresed. Wt Readings from Last 3 Encounters:  03/14/21 242 lb (109.8 kg)  02/28/21 235 lb (106.6 kg)  02/16/21 231 lb (104.8 kg)  7/11 When I last saw the patient in April he was 244 pounds.  He is down to around 230 since going to the hospital.  He also received extensive physical therapy in a nursing home.  Previously he was confined to a wheelchair.  He is now walking with a walker and receiving physical therapy at home.  There is still an open wound on his left shin.  I removed 4 stitches today that been placed ever since June 4 when he originally fell.  The wound is irregularly shaped but is approximately 1.5 cm in width and about 5 inches in length.  I covered this with Silvadene, nonadherent gauze, and wrapped with Coban.  He is getting daily dressing changes with a similar technique at home and has a wound care nurse coming out twice a week to evaluate.  He denies any pain at the site and there is no purulent drainage.  At that time, my plan was: Cellulitis has resolved.  I recommended daily dressing changes with Silvadene cream, nonadherent gauze, and Coban.  Recheck in 2 weeks.  At that time we will be due for fasting lab work.  Continue Lasix.  Wife has been giving him 80 mg in the morning rather than the way as prescribed on his medication list.  I am okay with him continuing to do this as this seems to be working well keeping him euvolemic.  However if he gains additional fluid by the time I  see him back we will need to uptitrate his diuretic.  03/14/21 The wound has not improved at all.  There is yellow slough in the base of the wound however is extremely shallow.  The wound is still approximately 10 mm wide but extends for 5 inches.  The skin around the wound is slightly erythematous but he denies any pain.  There is mild warmth to touch.  There is extensive swelling in his legs bilaterally.  His weight is up 7 pounds since his last visit.  He also has bibasilar crackles in both lungs and he is starting to appear fluid overloaded.  He is also due to recheck his blood sugar.  He denies any chest pain or shortness of breath however he is becoming more sedentary since being at home and is not doing as many exercises.  As result he appears slightly weaker.  At that time, my plan was:  Patient is currently taking 80 mg of Lasix in the morning.  I will increase his dose to 80 mg in the morning and 40 mg in the evening.  He does appear to be fluid overloaded today.  Recheck on Thursday.  Obtain a CMP today to monitor renal function and potassium and while checking lab work we will also check an A1c.  Blood  pressure today is well controlled at 132/64.  I will reassess his glycemic control by checking an A1c.  His last A1c in April was well controlled.  I started the patient on Keflex 500 mg p.o. 3 times daily for 7 days for cellulitis due to the redness around the wound in his right leg.  Also cover the wound with Silvadene, nonadherent gauze, and the patient was placed in an Unna boot as I believe that the swelling and the repeated cycles of swelling are causing the wound to heal slowly and poorly.  Recheck the patient every 3 days until healed.  Recheck on Thursday  03/17/21 Swelling has markedly improved in his left leg.  The erythema has faded.  There is no drainage or fluid coming from this shallow superficial ulcer.  The ulcer is now almost the level of the surrounding skin.  There is still thin  yellow slough on the surface of the wound however it is extremely shallow and appears to be healing well below the surface. Past Medical History:  Diagnosis Date   Anemia    04/2012: H&H-10.2/31.5, MCV-77; 06/2012:12/38.9   Arthritis    Benign prostatic hypertrophy    s/p transurethral resection of the prostate   Chest pain    2004; with dyspnea, stress nuclear-normal EF + questionable inferior wall ischemia, normal coronary angiography;  2010-negative stress nuclear   CVA (cerebral infarction)    asymptomatic (seen on CT)   Diarrhea    h/o Hemoccult-positive stool   Dysrhythmia    Edema of both legs    GERD (gastroesophageal reflux disease)    History of blood transfusion    History of kidney stones    History of urinary tract infection    Hypertension    Jerking movements of extremities    left arm    Low back pain    Obstructive sleep apnea    pt states can not use the CPAP   Peripheral neuropathy    lower legs and feet bilat    Persistent atrial fibrillation ([place]) 2013   Asymptomatic; diagnosed in 05/2012   Pneumonia    Prostate cancer ([place]) 2017   stage 4   Shortness of breath dyspnea    pt states only develops SOB if tries to do something too quickly   Upper GI bleed 1985   1985-peptic ulcer disease; initial hemigastrectomy; subsequent Billroth II   Urinary hesitancy    Vertigo    ED evaluation-06/2012   Past Surgical History:  Procedure Laterality Date   Billroth II     CHOLECYSTECTOMY     COLONOSCOPY  Approximately 2000   COLONOSCOPY  10/2012   Colonoscopy March 2014 at [facility], difficulty exam requiring fluoroscopy and overtube. Patient developed severe bradycardia again during his procedure like he did [facility]. Multiple polyps removed which were tubular adenomas. Previously tattooed sites at 20 and 30 cm were free of any polypoid change. Left-sided diverticulosis noted.   COLONOSCOPY WITH ESOPHAGOGASTRODUODENOSCOPY (EGD)  06-17-2009   GEX:[id number]  tubular adenomas and 2 tubulovillous adenomas, incomplete, 3 clips placed   ESOPHAGOGASTRODUODENOSCOPY  06/17/2009   SLF: normal/chronic gastritis (non-h pylori)   ESOPHAGOGASTRODUODENOSCOPY N/A 05/11/2015   MWN:UUVOZDG anemai due to postgastrectomy state/moderate erosive gastritis   INGUINAL HERNIA REPAIR     Left   ROTATOR CUFF REPAIR  1991   Bilateral   TRANSURETHRAL RESECTION OF PROSTATE  06/2016   TRANSURETHRAL RESECTION OF PROSTATE N/A 12/27/2012   Procedure: TRANSURETHRAL RESECTION OF THE PROSTATE (TURP);  Surgeon: Marissa Nestle, MD;  Location: [facility];  Service: Urology;  Laterality: N/A;   TRANSURETHRAL RESECTION OF PROSTATE N/A 04/11/2016   Procedure: TRANSURETHRAL RESECTION OF THE PROSTATE (TURP);  Surgeon: Irine Seal, MD;  Location: [facility];  Service: Urology;  Laterality: N/A;   VAGOTOMY AND PYLOROPLASTY  1970s   Details of procedure uncertain; 7741O   Current Outpatient Medications on File Prior to Visit  Medication Sig Dispense Refill   cephALEXin (KEFLEX) 500 MG capsule Take 1 capsule (500 mg total) by mouth 3 (three) times daily. 21 capsule 0   clobetasol (TEMOVATE) 0.05 % external solution Apply 1 application topically 2 (two) times daily. 50 mL 2   clobetasol cream (TEMOVATE) 8.78 % Apply 1 application topically daily as needed (use as directed for skin). 30 g 0   clopidogrel (PLAVIX) 75 MG tablet TAKE 1 TABLET(75 MG) BY MOUTH DAILY 30 tablet 0   furosemide (LASIX) 40 MG tablet Take 1 and 1/2 tabs in the AM and 1 tab in the afternoon 90 tablet 0   HYDROcodone-acetaminophen (NORCO) 10-325 MG tablet Take 1 tablet by mouth every 4 (four) hours as needed (maxium dose 5 tabs PO QD). Chronic Pain. Dx: G89.4. 150 tablet 0   losartan (COZAAR) 100 MG tablet TAKE 1 TABLET(100 MG) BY MOUTH DAILY 30 tablet 0   meclizine (ANTIVERT) 25 MG tablet TAKE 1 TABLET(25 MG) BY MOUTH AT BEDTIME 30 tablet 0   NON FORMULARY Diet:Regular, NAS     NYSTATIN powder APPLY TO THE AFFECTED AREA  TOPICALLY EVERY DAY AS NEEDED FOR RASH 60 g 3   pantoprazole (PROTONIX) 40 MG tablet TAKE 1 TABLET(40 MG) BY MOUTH DAILY 30 tablet 0   potassium chloride SA (KLOR-CON) 20 MEQ tablet TAKE 1 TABLET(20 MEQ) BY MOUTH DAILY 30 tablet 0   pravastatin (PRAVACHOL) 20 MG tablet TAKE 1 TABLET(20 MG) BY MOUTH DAILY 30 tablet 0   silver sulfADIAZINE (SILVADENE) 1 % cream Apply 1 application topically 2 (two) times daily. Special Instructions: Clean left lower leg with NS. Apply silvadene. Cover with gauze, wrap with kling, cover with an ace wrap 50 g 0   silver sulfADIAZINE (SILVADENE) 1 % cream Apply 1 application topically daily. 400 g 0   triamcinolone cream (KENALOG) 0.1 % Apply 1 application topically daily as needed (for irritation). 30 g 0   No current facility-administered medications on file prior to visit.   Allergies  Allergen Reactions   Contrast Media [Iodinated Diagnostic Agents]     Pt states he was given "dye" 20 yrs ago, anaphylaxis & syncope - was told to never have it again    Gabapentin Itching   Aspirin Other (See Comments)    Blistering    Latex Rash   Lipitor [Atorvastatin] Itching   Tape Rash    EKG leads   Social History   Socioeconomic History   Marital status: Married    Spouse name: [name]   Number of children: 5   Years of education: Not on file   Highest education level: Not on file  Occupational History   Occupation: Retired Pensions consultant: RETIRED  Tobacco Use   Smoking status: Former    Types: Cigars    Quit date: 08/22/2003    Years since quitting: 17.5   Smokeless tobacco: Never   Tobacco comments:    Quit many years ago; few cigars per day; didn't inhale  Vaping Use   Vaping Use: Never used  Substance and Sexual  Activity   Alcohol use: No    Alcohol/week: 1.0 standard drink    Types: 1 Standard drinks or equivalent per week   Drug use: No   Sexual activity: Not on file  Other Topics Concern   Not on file  Social History Narrative    Lives w/ wife in [place]   Retired truck Geophysicist/field seismologist   Social Determinants of Radio broadcast assistant Strain: Not on file  Food Insecurity: Not on file  Transportation Needs: Not on file  Physical Activity: Not on file  Stress: Not on file  Social Connections: Not on file  Intimate Partner Violence: Not on file      Review of Systems  All other systems reviewed and are negative.     Objective:   Physical Exam Vitals reviewed.  Constitutional:      General: He is not in acute distress.    Appearance: Normal appearance. He is obese. He is not ill-appearing or toxic-appearing.  Cardiovascular:     Rate and Rhythm: Normal rate. Rhythm irregular.     Heart sounds: Normal heart sounds. No murmur heard.   No friction rub. No gallop.  Pulmonary:     Effort: Pulmonary effort is normal. No respiratory distress.     Breath sounds: No stridor. Examination of the right-lower field reveals rales. Examination of the left-lower field reveals rales. Rales present. No wheezing or rhonchi.  Abdominal:     General: Abdomen is flat. Bowel sounds are normal. There is no distension.     Palpations: Abdomen is soft. There is no mass.     Tenderness: There is no abdominal tenderness. There is no guarding.  Musculoskeletal:     Right lower leg: Pitting Edema present.     Left lower leg: Deformity and laceration present. Pitting Edema present.     Right foot: Decreased range of motion. Swelling and deformity present.       Legs:  Skin:    Findings: No erythema.  Neurological:     General: No focal deficit present.     Mental Status: He is alert and oriented to person, place, and time.     Cranial Nerves: No cranial nerve deficit.     Sensory: No sensory deficit.     Motor: No weakness.     Coordination: Coordination normal.     Gait: Gait abnormal (Shuffling gait.  Requires a cane to ambulate).     Deep Tendon Reflexes: Reflexes normal.  Psychiatric:        Mood and Affect: Mood  normal.        Thought Content: Thought content normal.        Judgment: Judgment normal.          Assessment & Plan:  Open wound of left lower extremity, sequela Unna boot was replaced.  Wound seems to be healing well.  Recheck here on Monday to replace Unna boot and chart progress

## 2021-03-21 ENCOUNTER — Ambulatory Visit (INDEPENDENT_AMBULATORY_CARE_PROVIDER_SITE_OTHER): Payer: PPO | Admitting: Family Medicine

## 2021-03-21 ENCOUNTER — Other Ambulatory Visit: Payer: Self-pay

## 2021-03-21 ENCOUNTER — Telehealth: Payer: Self-pay | Admitting: *Deleted

## 2021-03-21 VITALS — BP 120/62 | HR 90 | Temp 98.9°F | Resp 18 | Ht 72.0 in | Wt 242.0 lb

## 2021-03-21 DIAGNOSIS — Z8673 Personal history of transient ischemic attack (TIA), and cerebral infarction without residual deficits: Secondary | ICD-10-CM | POA: Diagnosis not present

## 2021-03-21 DIAGNOSIS — S81802D Unspecified open wound, left lower leg, subsequent encounter: Secondary | ICD-10-CM

## 2021-03-21 DIAGNOSIS — L03116 Cellulitis of left lower limb: Secondary | ICD-10-CM | POA: Diagnosis not present

## 2021-03-21 DIAGNOSIS — Z7902 Long term (current) use of antithrombotics/antiplatelets: Secondary | ICD-10-CM | POA: Diagnosis not present

## 2021-03-21 DIAGNOSIS — G4733 Obstructive sleep apnea (adult) (pediatric): Secondary | ICD-10-CM | POA: Diagnosis not present

## 2021-03-21 DIAGNOSIS — K219 Gastro-esophageal reflux disease without esophagitis: Secondary | ICD-10-CM | POA: Diagnosis not present

## 2021-03-21 DIAGNOSIS — Z9181 History of falling: Secondary | ICD-10-CM | POA: Diagnosis not present

## 2021-03-21 DIAGNOSIS — W19XXXD Unspecified fall, subsequent encounter: Secondary | ICD-10-CM | POA: Diagnosis not present

## 2021-03-21 DIAGNOSIS — I4819 Other persistent atrial fibrillation: Secondary | ICD-10-CM | POA: Diagnosis not present

## 2021-03-21 DIAGNOSIS — Z79891 Long term (current) use of opiate analgesic: Secondary | ICD-10-CM | POA: Diagnosis not present

## 2021-03-21 DIAGNOSIS — Z8546 Personal history of malignant neoplasm of prostate: Secondary | ICD-10-CM | POA: Diagnosis not present

## 2021-03-21 DIAGNOSIS — S81802S Unspecified open wound, left lower leg, sequela: Secondary | ICD-10-CM

## 2021-03-21 DIAGNOSIS — S81812D Laceration without foreign body, left lower leg, subsequent encounter: Secondary | ICD-10-CM | POA: Diagnosis not present

## 2021-03-21 NOTE — Telephone Encounter (Signed)
Received call from [place] [facility] with [place] [phone]~ telephone.   Requested OV for wound care to [facility].   Orders given as follows: Remove UNNA boot on Thursday.  Apply silvadene and nonadherent guaze to open wound and cover with calamine embedded UNNA boot.   Patient has F/U appointment on Monday with PCP.

## 2021-03-21 NOTE — Progress Notes (Signed)
Subjective:    Patient ID: Bernard Ramirez, male    DOB: 08-31-1930, 87 y.o.   MRN: [id number]  Patient is an 87 year old Caucasian male who is here today for hospital follow-up.  When I last saw the patient I sent him to the hospital for an open wound on his left lower extremity and secondary cellulitis.  He was treated in the hospital with 10 days of Keflex which he has completed.  The erythema around the wound in his left lower leg looks dramatically better.  While hospitalized, he was aggressively diuresed. Wt Readings from Last 3 Encounters:  03/17/21 240 lb 13.6 oz (109.2 kg)  03/14/21 242 lb (109.8 kg)  02/28/21 235 lb (106.6 kg)  7/11 When I last saw the patient in April he was 244 pounds.  He is down to around 230 since going to the hospital.  He also received extensive physical therapy in a nursing home.  Previously he was confined to a wheelchair.  He is now walking with a walker and receiving physical therapy at home.  There is still an open wound on his left shin.  I removed 4 stitches today that been placed ever since June 4 when he originally fell.  The wound is irregularly shaped but is approximately 1.5 cm in width and about 5 inches in length.  I covered this with Silvadene, nonadherent gauze, and wrapped with Coban.  He is getting daily dressing changes with a similar technique at home and has a wound care nurse coming out twice a week to evaluate.  He denies any pain at the site and there is no purulent drainage.  At that time, my plan was: Cellulitis has resolved.  I recommended daily dressing changes with Silvadene cream, nonadherent gauze, and Coban.  Recheck in 2 weeks.  At that time we will be due for fasting lab work.  Continue Lasix.  Wife has been giving him 80 mg in the morning rather than the way as prescribed on his medication list.  I am okay with him continuing to do this as this seems to be working well keeping him euvolemic.  However if he gains additional fluid by the  time I see him back we will need to uptitrate his diuretic.  03/14/21 The wound has not improved at all.  There is yellow slough in the base of the wound however is extremely shallow.  The wound is still approximately 10 mm wide but extends for 5 inches.  The skin around the wound is slightly erythematous but he denies any pain.  There is mild warmth to touch.  There is extensive swelling in his legs bilaterally.  His weight is up 7 pounds since his last visit.  He also has bibasilar crackles in both lungs and he is starting to appear fluid overloaded.  He is also due to recheck his blood sugar.  He denies any chest pain or shortness of breath however he is becoming more sedentary since being at home and is not doing as many exercises.  As result he appears slightly weaker.  At that time, my plan was:  Patient is currently taking 80 mg of Lasix in the morning.  I will increase his dose to 80 mg in the morning and 40 mg in the evening.  He does appear to be fluid overloaded today.  Recheck on Thursday.  Obtain a CMP today to monitor renal function and potassium and while checking lab work we will also check an A1c.  Blood pressure today is well controlled at 132/64.  I will reassess his glycemic control by checking an A1c.  His last A1c in April was well controlled.  I started the patient on Keflex 500 mg p.o. 3 times daily for 7 days for cellulitis due to the redness around the wound in his right leg.  Also cover the wound with Silvadene, nonadherent gauze, and the patient was placed in an Unna boot as I believe that the swelling and the repeated cycles of swelling are causing the wound to heal slowly and poorly.  Recheck the patient every 3 days until healed.  Recheck on Thursday  03/17/21 Swelling has markedly improved in his left leg.  The erythema has faded.  There is no drainage or fluid coming from this shallow superficial ulcer.  The ulcer is now almost the level of the surrounding skin.  There is still  thin yellow slough on the surface of the wound however it is extremely shallow and appears to be healing well below the surface.  At that time, my plan was:  Unna boot was replaced.  Wound seems to be healing well.  Recheck here on Monday to replace Unna boot and chart progress  03/21/21 Patient is here today for recheck.  The wound seems to be healing nicely.  There is no evidence of secondary cellulitis.  There is very little drainage.  There is no erythema or warmth.  He denies any pain.  The wound bed is extremely shallow.  There are 2 wounds.  One is approximately 1 cm wide by about 4 cm long.  The other is 1 cm x 3 cm.  There is good granulation tissue in the base of each wound.  They run vertically over the lower anterior shin just above the ankle.  The first runs slightly superior and lateral to the inferior vertical wound Past Medical History:  Diagnosis Date   Anemia    04/2012: H&H-10.2/31.5, MCV-77; 06/2012:12/38.9   Arthritis    Benign prostatic hypertrophy    s/p transurethral resection of the prostate   Chest pain    2004; with dyspnea, stress nuclear-normal EF + questionable inferior wall ischemia, normal coronary angiography;  2010-negative stress nuclear   CVA (cerebral infarction)    asymptomatic (seen on CT)   Diarrhea    h/o Hemoccult-positive stool   Dysrhythmia    Edema of both legs    GERD (gastroesophageal reflux disease)    History of blood transfusion    History of kidney stones    History of urinary tract infection    Hypertension    Jerking movements of extremities    left arm    Low back pain    Obstructive sleep apnea    pt states can not use the CPAP   Peripheral neuropathy    lower legs and feet bilat    Persistent atrial fibrillation ([place]) 2013   Asymptomatic; diagnosed in 05/2012   Pneumonia    Prostate cancer ([place]) 2017   stage 4   Shortness of breath dyspnea    pt states only develops SOB if tries to do something too quickly   Upper GI bleed  1985   1985-peptic ulcer disease; initial hemigastrectomy; subsequent Billroth II   Urinary hesitancy    Vertigo    ED evaluation-06/2012   Past Surgical History:  Procedure Laterality Date   Billroth II     CHOLECYSTECTOMY     COLONOSCOPY  Approximately 2000   COLONOSCOPY  10/2012  Colonoscopy March 2014 at [facility], difficulty exam requiring fluoroscopy and overtube. Patient developed severe bradycardia again during his procedure like he did [facility]. Multiple polyps removed which were tubular adenomas. Previously tattooed sites at 20 and 30 cm were free of any polypoid change. Left-sided diverticulosis noted.   COLONOSCOPY WITH ESOPHAGOGASTRODUODENOSCOPY (EGD)  06-17-2009   [id number] tubular adenomas and 2 tubulovillous adenomas, incomplete, 3 clips placed   ESOPHAGOGASTRODUODENOSCOPY  06/17/2009   SLF: normal/chronic gastritis (non-h pylori)   ESOPHAGOGASTRODUODENOSCOPY N/A 05/11/2015   [id number] anemai due to postgastrectomy state/moderate erosive gastritis   INGUINAL HERNIA REPAIR     Left   ROTATOR CUFF REPAIR  1991   Bilateral   TRANSURETHRAL RESECTION OF PROSTATE  06/2016   TRANSURETHRAL RESECTION OF PROSTATE N/A 12/27/2012   Procedure: TRANSURETHRAL RESECTION OF THE PROSTATE (TURP);  Surgeon: Marissa Nestle, MD;  Location: [facility];  Service: Urology;  Laterality: N/A;   TRANSURETHRAL RESECTION OF PROSTATE N/A 04/11/2016   Procedure: TRANSURETHRAL RESECTION OF THE PROSTATE (TURP);  Surgeon: Irine Seal, MD;  Location: [facility];  Service: Urology;  Laterality: N/A;   VAGOTOMY AND PYLOROPLASTY  1970s   Details of procedure uncertain; 8756E   Current Outpatient Medications on File Prior to Visit  Medication Sig Dispense Refill   cephALEXin (KEFLEX) 500 MG capsule Take 1 capsule (500 mg total) by mouth 3 (three) times daily. 21 capsule 0   clobetasol (TEMOVATE) 0.05 % external solution Apply 1 application topically 2 (two) times daily. 50 mL 2   clobetasol cream  (TEMOVATE) 3.32 % Apply 1 application topically daily as needed (use as directed for skin). 30 g 0   clopidogrel (PLAVIX) 75 MG tablet TAKE 1 TABLET(75 MG) BY MOUTH DAILY 30 tablet 0   furosemide (LASIX) 40 MG tablet Take 1 and 1/2 tabs in the AM and 1 tab in the afternoon 90 tablet 0   HYDROcodone-acetaminophen (NORCO) 10-325 MG tablet Take 1 tablet by mouth every 4 (four) hours as needed (maxium dose 5 tabs PO QD). Chronic Pain. Dx: G89.4. 150 tablet 0   losartan (COZAAR) 100 MG tablet TAKE 1 TABLET(100 MG) BY MOUTH DAILY 30 tablet 0   meclizine (ANTIVERT) 25 MG tablet TAKE 1 TABLET(25 MG) BY MOUTH AT BEDTIME 30 tablet 0   NON FORMULARY Diet:Regular, NAS     NYSTATIN powder APPLY TO THE AFFECTED AREA TOPICALLY EVERY DAY AS NEEDED FOR RASH 60 g 3   pantoprazole (PROTONIX) 40 MG tablet TAKE 1 TABLET(40 MG) BY MOUTH DAILY 30 tablet 0   potassium chloride SA (KLOR-CON) 20 MEQ tablet TAKE 1 TABLET(20 MEQ) BY MOUTH DAILY 30 tablet 0   pravastatin (PRAVACHOL) 20 MG tablet TAKE 1 TABLET(20 MG) BY MOUTH DAILY 30 tablet 0   silver sulfADIAZINE (SILVADENE) 1 % cream Apply 1 application topically 2 (two) times daily. Special Instructions: Clean left lower leg with NS. Apply silvadene. Cover with gauze, wrap with kling, cover with an ace wrap 50 g 0   silver sulfADIAZINE (SILVADENE) 1 % cream Apply 1 application topically daily. 400 g 0   triamcinolone cream (KENALOG) 0.1 % Apply 1 application topically daily as needed (for irritation). 30 g 0   No current facility-administered medications on file prior to visit.   Allergies  Allergen Reactions   Contrast Media [Iodinated Diagnostic Agents]     Pt states he was given "dye" 20 yrs ago, anaphylaxis & syncope - was told to never have it again    Gabapentin Itching  Aspirin Other (See Comments)    Blistering    Latex Rash   Lipitor [Atorvastatin] Itching   Tape Rash    EKG leads   Social History   Socioeconomic History   Marital status: Married     Spouse name: [name]   Number of children: 5   Years of education: Not on file   Highest education level: Not on file  Occupational History   Occupation: Retired Pensions consultant: RETIRED  Tobacco Use   Smoking status: Former    Types: Cigars    Quit date: 08/22/2003    Years since quitting: 17.5   Smokeless tobacco: Never   Tobacco comments:    Quit many years ago; few cigars per day; didn't inhale  Vaping Use   Vaping Use: Never used  Substance and Sexual Activity   Alcohol use: No    Alcohol/week: 1.0 standard drink    Types: 1 Standard drinks or equivalent per week   Drug use: No   Sexual activity: Not on file  Other Topics Concern   Not on file  Social History Narrative   Lives w/ wife in [place]   Retired truck Geophysicist/field seismologist   Social Determinants of Radio broadcast assistant Strain: Not on file  Food Insecurity: Not on file  Transportation Needs: Not on file  Physical Activity: Not on file  Stress: Not on file  Social Connections: Not on file  Intimate Partner Violence: Not on file      Review of Systems  All other systems reviewed and are negative.     Objective:   Physical Exam Vitals reviewed.  Constitutional:      General: He is not in acute distress.    Appearance: Normal appearance. He is obese. He is not ill-appearing or toxic-appearing.  Cardiovascular:     Rate and Rhythm: Normal rate. Rhythm irregular.     Heart sounds: Normal heart sounds. No murmur heard.   No friction rub. No gallop.  Pulmonary:     Effort: Pulmonary effort is normal. No respiratory distress.     Breath sounds: No stridor. Examination of the right-lower field reveals rales. Examination of the left-lower field reveals rales. Rales present. No wheezing or rhonchi.  Abdominal:     General: Abdomen is flat. Bowel sounds are normal. There is no distension.     Palpations: Abdomen is soft. There is no mass.     Tenderness: There is no abdominal tenderness. There is no  guarding.  Musculoskeletal:     Right lower leg: Pitting Edema present.     Left lower leg: Deformity and laceration present. Pitting Edema present.     Right foot: Decreased range of motion. Swelling and deformity present.       Legs:  Skin:    Findings: No erythema.  Neurological:     General: No focal deficit present.     Mental Status: He is alert and oriented to person, place, and time.     Cranial Nerves: No cranial nerve deficit.     Sensory: No sensory deficit.     Motor: No weakness.     Coordination: Coordination normal.     Gait: Gait abnormal (Shuffling gait.  Requires a cane to ambulate).     Deep Tendon Reflexes: Reflexes normal.  Psychiatric:        Mood and Affect: Mood normal.        Thought Content: Thought content normal.  Judgment: Judgment normal.          Assessment & Plan:  Open wound of left lower extremity, sequela Slowly healing without any secondary complications.  Continue twice weekly Unna boot dressing changes.  Recheck here on Thursday or Monday depending on when home health nursing can come.  If they can change in a boot on Thursday I would dress the patient again on Monday.  The wound was covered with Silvadene, nonadherent gauze, and an Unna boot was replaced

## 2021-03-28 ENCOUNTER — Ambulatory Visit (INDEPENDENT_AMBULATORY_CARE_PROVIDER_SITE_OTHER): Payer: PPO | Admitting: Family Medicine

## 2021-03-28 ENCOUNTER — Other Ambulatory Visit: Payer: Self-pay

## 2021-03-28 ENCOUNTER — Telehealth: Payer: Self-pay | Admitting: *Deleted

## 2021-03-28 VITALS — BP 118/70 | HR 86 | Temp 98.4°F | Resp 18 | Ht 72.0 in | Wt 240.4 lb

## 2021-03-28 DIAGNOSIS — S81802S Unspecified open wound, left lower leg, sequela: Secondary | ICD-10-CM

## 2021-03-28 DIAGNOSIS — S81802D Unspecified open wound, left lower leg, subsequent encounter: Secondary | ICD-10-CM

## 2021-03-28 MED ORDER — SANTYL 250 UNIT/GM EX OINT: 1.0000 "application " | TOPICAL_OINTMENT | Freq: Every day | CUTANEOUS | 0 refills | Status: DC

## 2021-03-28 NOTE — Progress Notes (Signed)
Subjective:    Patient ID: Bernard Ramirez, male    DOB: 1931/08/03, 87 y.o.   MRN: [id number]   After almost 1 month, we have hidden impasse.  Please see the photograph above.  There is yellow slough adherent to the very superficial ulcer on his left lower shin.  It has not improved over the last 2 weeks despite doing Unna boot dressing changes twice weekly.  The swelling in his leg has been controlled by wound closure seems to have arrested.  He denies any pain or warmth or tenderness.  There is no fluctuance. Past Medical History:  Diagnosis Date   Anemia    04/2012: H&H-10.2/31.5, MCV-77; 06/2012:12/38.9   Arthritis    Benign prostatic hypertrophy    s/p transurethral resection of the prostate   Chest pain    2004; with dyspnea, stress nuclear-normal EF + questionable inferior wall ischemia, normal coronary angiography;  2010-negative stress nuclear   CVA (cerebral infarction)    asymptomatic (seen on CT)   Diarrhea    h/o Hemoccult-positive stool   Dysrhythmia    Edema of both legs    GERD (gastroesophageal reflux disease)    History of blood transfusion    History of kidney stones    History of urinary tract infection    Hypertension    Jerking movements of extremities    left arm    Low back pain    Obstructive sleep apnea    pt states can not use the CPAP   Peripheral neuropathy    lower legs and feet bilat    Persistent atrial fibrillation ([place]) 2013   Asymptomatic; diagnosed in 05/2012   Pneumonia    Prostate cancer ([place]) 2017   stage 4   Shortness of breath dyspnea    pt states only develops SOB if tries to do something too quickly   Upper GI bleed 1985   1985-peptic ulcer disease; initial hemigastrectomy; subsequent Billroth II   Urinary hesitancy    Vertigo    ED evaluation-06/2012   Past Surgical History:  Procedure Laterality Date   Billroth II     CHOLECYSTECTOMY     COLONOSCOPY  Approximately 2000   COLONOSCOPY  10/2012   Colonoscopy March 2014 at  [facility], difficulty exam requiring fluoroscopy and overtube. Patient developed severe bradycardia again during his procedure like he did [facility]. Multiple polyps removed which were tubular adenomas. Previously tattooed sites at 20 and 30 cm were free of any polypoid change. Left-sided diverticulosis noted.   COLONOSCOPY WITH ESOPHAGOGASTRODUODENOSCOPY (EGD)  06-17-2009   [id number] tubular adenomas and 2 tubulovillous adenomas, incomplete, 3 clips placed   ESOPHAGOGASTRODUODENOSCOPY  06/17/2009   SLF: normal/chronic gastritis (non-h pylori)   ESOPHAGOGASTRODUODENOSCOPY N/A 05/11/2015   JOI:NOMVEHM anemai due to postgastrectomy state/moderate erosive gastritis   INGUINAL HERNIA REPAIR     Left   ROTATOR CUFF REPAIR  1991   Bilateral   TRANSURETHRAL RESECTION OF PROSTATE  06/2016   TRANSURETHRAL RESECTION OF PROSTATE N/A 12/27/2012   Procedure: TRANSURETHRAL RESECTION OF THE PROSTATE (TURP);  Surgeon: Marissa Nestle, MD;  Location: [facility];  Service: Urology;  Laterality: N/A;   TRANSURETHRAL RESECTION OF PROSTATE N/A 04/11/2016   Procedure: TRANSURETHRAL RESECTION OF THE PROSTATE (TURP);  Surgeon: Irine Seal, MD;  Location: [facility];  Service: Urology;  Laterality: N/A;   VAGOTOMY AND PYLOROPLASTY  1970s   Details of procedure uncertain; 0947S   Current Outpatient Medications on File Prior to Visit  Medication Sig  Dispense Refill   cephALEXin (KEFLEX) 500 MG capsule Take 1 capsule (500 mg total) by mouth 3 (three) times daily. 21 capsule 0   clobetasol (TEMOVATE) 0.05 % external solution Apply 1 application topically 2 (two) times daily. 50 mL 2   clobetasol cream (TEMOVATE) 9.38 % Apply 1 application topically daily as needed (use as directed for skin). 30 g 0   clopidogrel (PLAVIX) 75 MG tablet TAKE 1 TABLET(75 MG) BY MOUTH DAILY 30 tablet 0   furosemide (LASIX) 40 MG tablet Take 1 and 1/2 tabs in the AM and 1 tab in the afternoon 90 tablet 0   HYDROcodone-acetaminophen (NORCO)  10-325 MG tablet Take 1 tablet by mouth every 4 (four) hours as needed (maxium dose 5 tabs PO QD). Chronic Pain. Dx: G89.4. 150 tablet 0   losartan (COZAAR) 100 MG tablet TAKE 1 TABLET(100 MG) BY MOUTH DAILY 30 tablet 0   meclizine (ANTIVERT) 25 MG tablet TAKE 1 TABLET(25 MG) BY MOUTH AT BEDTIME 30 tablet 0   NON FORMULARY Diet:Regular, NAS     NYSTATIN powder APPLY TO THE AFFECTED AREA TOPICALLY EVERY DAY AS NEEDED FOR RASH 60 g 3   pantoprazole (PROTONIX) 40 MG tablet TAKE 1 TABLET(40 MG) BY MOUTH DAILY 30 tablet 0   potassium chloride SA (KLOR-CON) 20 MEQ tablet TAKE 1 TABLET(20 MEQ) BY MOUTH DAILY 30 tablet 0   pravastatin (PRAVACHOL) 20 MG tablet TAKE 1 TABLET(20 MG) BY MOUTH DAILY 30 tablet 0   silver sulfADIAZINE (SILVADENE) 1 % cream Apply 1 application topically 2 (two) times daily. Special Instructions: Clean left lower leg with NS. Apply silvadene. Cover with gauze, wrap with kling, cover with an ace wrap 50 g 0   silver sulfADIAZINE (SILVADENE) 1 % cream Apply 1 application topically daily. 400 g 0   triamcinolone cream (KENALOG) 0.1 % Apply 1 application topically daily as needed (for irritation). 30 g 0   No current facility-administered medications on file prior to visit.   Allergies  Allergen Reactions   Contrast Media [Iodinated Diagnostic Agents]     Pt states he was given "dye" 20 yrs ago, anaphylaxis & syncope - was told to never have it again    Gabapentin Itching   Aspirin Other (See Comments)    Blistering    Latex Rash   Lipitor [Atorvastatin] Itching   Tape Rash    EKG leads   Social History   Socioeconomic History   Marital status: Married    Spouse name: [name]   Number of children: 5   Years of education: Not on file   Highest education level: Not on file  Occupational History   Occupation: Retired Pensions consultant: RETIRED  Tobacco Use   Smoking status: Former    Types: Cigars    Quit date: 08/22/2003    Years since quitting: 17.6    Smokeless tobacco: Never   Tobacco comments:    Quit many years ago; few cigars per day; didn't inhale  Vaping Use   Vaping Use: Never used  Substance and Sexual Activity   Alcohol use: No    Alcohol/week: 1.0 standard drink    Types: 1 Standard drinks or equivalent per week   Drug use: No   Sexual activity: Not on file  Other Topics Concern   Not on file  Social History Narrative   Lives w/ wife in [place]   Retired truck Geophysicist/field seismologist   Social Determinants of Health   Financial Resource Strain: Not  on file  Food Insecurity: Not on file  Transportation Needs: Not on file  Physical Activity: Not on file  Stress: Not on file  Social Connections: Not on file  Intimate Partner Violence: Not on file      Review of Systems  All other systems reviewed and are negative.     Objective:   Physical Exam Vitals reviewed.  Constitutional:      General: He is not in acute distress.    Appearance: Normal appearance. He is obese. He is not ill-appearing or toxic-appearing.  Cardiovascular:     Rate and Rhythm: Normal rate. Rhythm irregular.     Heart sounds: Normal heart sounds. No murmur heard.   No friction rub. No gallop.  Pulmonary:     Effort: Pulmonary effort is normal. No respiratory distress.     Breath sounds: No stridor. Examination of the right-lower field reveals rales. Examination of the left-lower field reveals rales. Rales present. No wheezing or rhonchi.  Abdominal:     General: Abdomen is flat. Bowel sounds are normal. There is no distension.     Palpations: Abdomen is soft. There is no mass.     Tenderness: There is no abdominal tenderness. There is no guarding.  Musculoskeletal:     Right lower leg: Pitting Edema present.     Left lower leg: Deformity and laceration present. Pitting Edema present.     Right foot: Decreased range of motion. Swelling and deformity present.       Legs:  Skin:    Findings: No erythema.  Neurological:     General: No focal  deficit present.     Mental Status: He is alert and oriented to person, place, and time.     Cranial Nerves: No cranial nerve deficit.     Sensory: No sensory deficit.     Motor: No weakness.     Coordination: Coordination normal.     Gait: Gait abnormal (Shuffling gait.  Requires a cane to ambulate).     Deep Tendon Reflexes: Reflexes normal.  Psychiatric:        Mood and Affect: Mood normal.        Thought Content: Thought content normal.        Judgment: Judgment normal.          Assessment & Plan:  Open wound of left lower extremity, sequela I am going to follow wound care suggestion of switching to collagenase topical enzymatic debridement.  We will apply Santyl daily to the wound.  It will then be covered with nonadherent gauze and wrapped in Coban.  We will do this dressing change daily until the slough has been removed.  Hopefully this will help expedite wound healing.

## 2021-03-28 NOTE — Telephone Encounter (Signed)
Call placed to [place], [place] with [facility] [phone]~ telephone.  Wound Care to LLE orders given as follows: Apply santyl ointment to yellow slough to LLE, cover with nonadherent guaze to open wound and cover with Koban QD.  D/C Santyl once yellow slough has resolved.

## 2021-03-31 ENCOUNTER — Telehealth: Payer: Self-pay | Admitting: *Deleted

## 2021-03-31 NOTE — Telephone Encounter (Signed)
Received call from [place], [facility] with [facility] [phone]~ telephone.   Requested VO to extend [facility] PT 2x weekly x 2 weeks, then 1x weekly x1 week for balance, gait and strengthening.   VO given.

## 2021-04-04 ENCOUNTER — Ambulatory Visit (INDEPENDENT_AMBULATORY_CARE_PROVIDER_SITE_OTHER): Payer: PPO | Admitting: Family Medicine

## 2021-04-04 ENCOUNTER — Other Ambulatory Visit: Payer: Self-pay

## 2021-04-04 VITALS — BP 130/68 | HR 52 | Temp 98.5°F | Resp 14 | Ht 72.0 in | Wt 236.0 lb

## 2021-04-04 DIAGNOSIS — S81802S Unspecified open wound, left lower leg, sequela: Secondary | ICD-10-CM

## 2021-04-04 DIAGNOSIS — S81802D Unspecified open wound, left lower leg, subsequent encounter: Secondary | ICD-10-CM

## 2021-04-04 MED ORDER — MECLIZINE HCL 25 MG PO TABS: ORAL_TABLET | ORAL | 3 refills | Status: DC

## 2021-04-04 NOTE — Progress Notes (Signed)
Subjective:    Patient ID: Bernard Ramirez, male    DOB: Oct 31, 1930, 87 y.o.   MRN: [id number] 03/28/21  After almost 1 month, we have hidden impasse.  Please see the photograph above.  There is yellow slough adherent to the very superficial ulcer on his left lower shin.  It has not improved over the last 2 weeks despite doing Unna boot dressing changes twice weekly.  The swelling in his leg has been controlled by wound closure seems to have arrested.  He denies any pain or warmth or tenderness.  There is no fluctuance.  At that time, my plan was:  I am going to follow wound care suggestion of switching to collagenase topical enzymatic debridement.  We will apply Santyl daily to the wound.  It will then be covered with nonadherent gauze and wrapped in Coban.  We will do this dressing change daily until the slough has been removed.  Hopefully this will help expedite wound healing.  04/04/21 Wt Readings from Last 3 Encounters:  04/04/21 236 lb (107 kg)  03/28/21 240 lb 6.4 oz (109 kg)  03/21/21 242 lb (109.8 kg)   Wound looks better today.  The thick yellow slough seen last time in the photo above has thinned out considerably.  In fact some red granulation tissue can be seen poking through in several places.  The ulcer remains very shallow.  The size of the wound is essentially unchanged.  He denies any fever or chills or pain.  There is no surrounding erythema.  Despite not wearing the [employer], his weight has actually dropped 4 pounds and he does not appear fluid overloaded Past Medical History:  Diagnosis Date   Anemia    04/2012: H&H-10.2/31.5, MCV-77; 06/2012:12/38.9   Arthritis    Benign prostatic hypertrophy    s/p transurethral resection of the prostate   Chest pain    2004; with dyspnea, stress nuclear-normal EF + questionable inferior wall ischemia, normal coronary angiography;  2010-negative stress nuclear   CVA (cerebral infarction)    asymptomatic (seen on CT)   Diarrhea    h/o  Hemoccult-positive stool   Dysrhythmia    Edema of both legs    GERD (gastroesophageal reflux disease)    History of blood transfusion    History of kidney stones    History of urinary tract infection    Hypertension    Jerking movements of extremities    left arm    Low back pain    Obstructive sleep apnea    pt states can not use the CPAP   Peripheral neuropathy    lower legs and feet bilat    Persistent atrial fibrillation ([place]) 2013   Asymptomatic; diagnosed in 05/2012   Pneumonia    Prostate cancer ([place]) 2017   stage 4   Shortness of breath dyspnea    pt states only develops SOB if tries to do something too quickly   Upper GI bleed 1985   1985-peptic ulcer disease; initial hemigastrectomy; subsequent Billroth II   Urinary hesitancy    Vertigo    ED evaluation-06/2012   Past Surgical History:  Procedure Laterality Date   Billroth II     CHOLECYSTECTOMY     COLONOSCOPY  Approximately 2000   COLONOSCOPY  10/2012   Colonoscopy March 2014 at [facility], difficulty exam requiring fluoroscopy and overtube. Patient developed severe bradycardia again during his procedure like he did [facility]. Multiple polyps removed which were tubular adenomas. Previously tattooed  sites at 20 and 30 cm were free of any polypoid change. Left-sided diverticulosis noted.   COLONOSCOPY WITH ESOPHAGOGASTRODUODENOSCOPY (EGD)  06-17-2009   EHU:DJSHFWYO tubular adenomas and 2 tubulovillous adenomas, incomplete, 3 clips placed   ESOPHAGOGASTRODUODENOSCOPY  06/17/2009   SLF: normal/chronic gastritis (non-h pylori)   ESOPHAGOGASTRODUODENOSCOPY N/A 05/11/2015   VZC:HYIFOYD anemai due to postgastrectomy state/moderate erosive gastritis   INGUINAL HERNIA REPAIR     Left   ROTATOR CUFF REPAIR  1991   Bilateral   TRANSURETHRAL RESECTION OF PROSTATE  06/2016   TRANSURETHRAL RESECTION OF PROSTATE N/A 12/27/2012   Procedure: TRANSURETHRAL RESECTION OF THE PROSTATE (TURP);  Surgeon: Marissa Nestle, MD;   Location: [facility];  Service: Urology;  Laterality: N/A;   TRANSURETHRAL RESECTION OF PROSTATE N/A 04/11/2016   Procedure: TRANSURETHRAL RESECTION OF THE PROSTATE (TURP);  Surgeon: Irine Seal, MD;  Location: [facility];  Service: Urology;  Laterality: N/A;   VAGOTOMY AND PYLOROPLASTY  1970s   Details of procedure uncertain; 7412I   Current Outpatient Medications on File Prior to Visit  Medication Sig Dispense Refill   cephALEXin (KEFLEX) 500 MG capsule Take 1 capsule (500 mg total) by mouth 3 (three) times daily. 21 capsule 0   clobetasol (TEMOVATE) 0.05 % external solution Apply 1 application topically 2 (two) times daily. 50 mL 2   clobetasol cream (TEMOVATE) 7.86 % Apply 1 application topically daily as needed (use as directed for skin). 30 g 0   clopidogrel (PLAVIX) 75 MG tablet TAKE 1 TABLET(75 MG) BY MOUTH DAILY 30 tablet 0   collagenase (SANTYL) ointment Apply 1 application topically daily. TO YELLOW SLOUGH TO LLE 15 g 0   furosemide (LASIX) 40 MG tablet Take 1 and 1/2 tabs in the AM and 1 tab in the afternoon 90 tablet 0   HYDROcodone-acetaminophen (NORCO) 10-325 MG tablet Take 1 tablet by mouth every 4 (four) hours as needed (maxium dose 5 tabs PO QD). Chronic Pain. Dx: G89.4. 150 tablet 0   losartan (COZAAR) 100 MG tablet TAKE 1 TABLET(100 MG) BY MOUTH DAILY 30 tablet 0   meclizine (ANTIVERT) 25 MG tablet TAKE 1 TABLET(25 MG) BY MOUTH AT BEDTIME 30 tablet 0   NON FORMULARY Diet:Regular, NAS     NYSTATIN powder APPLY TO THE AFFECTED AREA TOPICALLY EVERY DAY AS NEEDED FOR RASH 60 g 3   pantoprazole (PROTONIX) 40 MG tablet TAKE 1 TABLET(40 MG) BY MOUTH DAILY 30 tablet 0   potassium chloride SA (KLOR-CON) 20 MEQ tablet TAKE 1 TABLET(20 MEQ) BY MOUTH DAILY 30 tablet 0   pravastatin (PRAVACHOL) 20 MG tablet TAKE 1 TABLET(20 MG) BY MOUTH DAILY 30 tablet 0   silver sulfADIAZINE (SILVADENE) 1 % cream Apply 1 application topically 2 (two) times daily. Special Instructions: Clean left lower leg with  NS. Apply silvadene. Cover with gauze, wrap with kling, cover with an ace wrap 50 g 0   silver sulfADIAZINE (SILVADENE) 1 % cream Apply 1 application topically daily. 400 g 0   triamcinolone cream (KENALOG) 0.1 % Apply 1 application topically daily as needed (for irritation). 30 g 0   No current facility-administered medications on file prior to visit.   Allergies  Allergen Reactions   Contrast Media [Iodinated Diagnostic Agents]     Pt states he was given "dye" 20 yrs ago, anaphylaxis & syncope - was told to never have it again    Gabapentin Itching   Aspirin Other (See Comments)    Blistering    Latex Rash  Lipitor [Atorvastatin] Itching   Tape Rash    EKG leads   Social History   Socioeconomic History   Marital status: Married    Spouse name: [name]   Number of children: 5   Years of education: Not on file   Highest education level: Not on file  Occupational History   Occupation: Retired Pensions consultant: RETIRED  Tobacco Use   Smoking status: Former    Types: Cigars    Quit date: 08/22/2003    Years since quitting: 17.6   Smokeless tobacco: Never   Tobacco comments:    Quit many years ago; few cigars per day; didn't inhale  Vaping Use   Vaping Use: Never used  Substance and Sexual Activity   Alcohol use: No    Alcohol/week: 1.0 standard drink    Types: 1 Standard drinks or equivalent per week   Drug use: No   Sexual activity: Not on file  Other Topics Concern   Not on file  Social History Narrative   Lives w/ wife in [place]   Retired truck Geophysicist/field seismologist   Social Determinants of Radio broadcast assistant Strain: Not on file  Food Insecurity: Not on file  Transportation Needs: Not on file  Physical Activity: Not on file  Stress: Not on file  Social Connections: Not on file  Intimate Partner Violence: Not on file      Review of Systems  All other systems reviewed and are negative.     Objective:   Physical Exam Vitals reviewed.   Constitutional:      General: He is not in acute distress.    Appearance: Normal appearance. He is obese. He is not ill-appearing or toxic-appearing.  Cardiovascular:     Rate and Rhythm: Normal rate. Rhythm irregular.     Heart sounds: Normal heart sounds. No murmur heard.   No friction rub. No gallop.  Pulmonary:     Effort: Pulmonary effort is normal. No respiratory distress.     Breath sounds: No stridor. Examination of the right-lower field reveals rales. Examination of the left-lower field reveals rales. Rales present. No wheezing or rhonchi.  Abdominal:     General: Abdomen is flat. Bowel sounds are normal. There is no distension.     Palpations: Abdomen is soft. There is no mass.     Tenderness: There is no abdominal tenderness. There is no guarding.  Musculoskeletal:     Right lower leg: Pitting Edema present.     Left lower leg: Deformity and laceration present. Pitting Edema present.     Right foot: Decreased range of motion. Swelling and deformity present.       Legs:  Skin:    Findings: No erythema.  Neurological:     General: No focal deficit present.     Mental Status: He is alert and oriented to person, place, and time.     Cranial Nerves: No cranial nerve deficit.     Sensory: No sensory deficit.     Motor: No weakness.     Coordination: Coordination normal.     Gait: Gait abnormal (Shuffling gait.  Requires a cane to ambulate).     Deep Tendon Reflexes: Reflexes normal.  Psychiatric:        Mood and Affect: Mood normal.        Thought Content: Thought content normal.        Judgment: Judgment normal.          Assessment & Plan:  Open wound of left lower extremity, sequela Slough seems to have receded considerably using the [place] daily.  Continue the current dressing changes and reassess the patient in 1 week.  Continue to try to encourage the pain to exercise daily and walk more in an effort to avoid deconditioning which essentially put him in the  nursing home last time.  No evidence of any secondary infection.

## 2021-04-06 ENCOUNTER — Telehealth: Payer: Self-pay | Admitting: Pharmacist

## 2021-04-06 NOTE — Progress Notes (Addendum)
Chronic Care Management Pharmacy Assistant   Name: Bernard Ramirez  MRN: [id number] DOB: 10/09/1930  Reason for Encounter: Disease State For HTN.    Conditions to be addressed/monitored: Hypertension, afib, CHF, CKD, hyperlipidemia.   Recent office visits:  04/04/21 Dr. Dennard Schaumann For wound care. No medication changes.  03/28/21 Dr. Dennard Schaumann For wound care. STARTED Collagenase 791 unit/gm 1 application daily.   03/21/21 Dr. Dennard Schaumann For wound care. No medication changes. Per note: [employer] replaced.  03/17/21 Dr. Dennard Schaumann For wound care. No medication changes. Per note: [employer] replaced.  03/14/21 Dr. Dennard Schaumann For follow-up. STARTED Cephalexin 500 mg 3 times daily. Per note: Lasix increase to dose to 80 mg in the morning and 40 mg in the evening. 02/28/21 Dr. Norva Riffle For left leg cellulitis. No medication changes.  02/01/21 Dr. Dennard Schaumann For follow-up/Cellulitis. No medication changes.   Recent consult visits:  01/27/21 Orthopedic Surgery Aluisio, Dione Plover. No medication changes.   Hospital visits:  02/05/21 [place] [facility] (12 Days) Hendricks Limes, MD.  02/01/21 [facility] (4 days) Manuella Ghazi, Pratik D, DO. For Cellulitis No medication changes.  01/23/21 [facility] (5 Hours) Ripley Fraise, MD. For fall. Per note: Labs, Images preformed. No medication changes.   Medications: Outpatient Encounter Medications as of 04/06/2021  Medication Sig   cephALEXin (KEFLEX) 500 MG capsule Take 1 capsule (500 mg total) by mouth 3 (three) times daily.   clobetasol (TEMOVATE) 0.05 % external solution Apply 1 application topically 2 (two) times daily.   clobetasol cream (TEMOVATE) 5.05 % Apply 1 application topically daily as needed (use as directed for skin).   clopidogrel (PLAVIX) 75 MG tablet TAKE 1 TABLET(75 MG) BY MOUTH DAILY   collagenase (SANTYL) ointment Apply 1 application topically daily. TO YELLOW SLOUGH TO LLE   furosemide (LASIX) 40 MG tablet Take 1 and 1/2 tabs in the  AM and 1 tab in the afternoon   HYDROcodone-acetaminophen (NORCO) 10-325 MG tablet Take 1 tablet by mouth every 4 (four) hours as needed (maxium dose 5 tabs PO QD). Chronic Pain. Dx: G89.4.   losartan (COZAAR) 100 MG tablet TAKE 1 TABLET(100 MG) BY MOUTH DAILY   meclizine (ANTIVERT) 25 MG tablet TAKE 1 TABLET(25 MG) BY MOUTH AT BEDTIME   NON FORMULARY Diet:Regular, NAS   NYSTATIN powder APPLY TO THE AFFECTED AREA TOPICALLY EVERY DAY AS NEEDED FOR RASH   pantoprazole (PROTONIX) 40 MG tablet TAKE 1 TABLET(40 MG) BY MOUTH DAILY   potassium chloride SA (KLOR-CON) 20 MEQ tablet TAKE 1 TABLET(20 MEQ) BY MOUTH DAILY   pravastatin (PRAVACHOL) 20 MG tablet TAKE 1 TABLET(20 MG) BY MOUTH DAILY   silver sulfADIAZINE (SILVADENE) 1 % cream Apply 1 application topically 2 (two) times daily. Special Instructions: Clean left lower leg with NS. Apply silvadene. Cover with gauze, wrap with kling, cover with an ace wrap   silver sulfADIAZINE (SILVADENE) 1 % cream Apply 1 application topically daily.   triamcinolone cream (KENALOG) 0.1 % Apply 1 application topically daily as needed (for irritation).   No facility-administered encounter medications on file as of 04/06/2021.   Reviewed chart prior to disease state call. Spoke with patient regarding BP  Recent Office Vitals: BP Readings from Last 3 Encounters:  04/04/21 130/68  03/28/21 118/70  03/21/21 120/62   Pulse Readings from Last 3 Encounters:  04/04/21 (!) 52  03/28/21 86  03/21/21 90    Wt Readings from Last 3 Encounters:  04/04/21 236 lb (107 kg)  03/28/21  240 lb 6.4 oz (109 kg)  03/21/21 242 lb (109.8 kg)     Kidney Function Lab Results  Component Value Date/Time   CREATININE 1.01 03/14/2021 12:52 PM   CREATININE 0.98 02/05/2021 06:24 AM   CREATININE 1.08 02/04/2021 06:47 AM   CREATININE 1.17 (H) 12/13/2020 12:32 PM   GFRNONAA >60 02/05/2021 06:24 AM   GFRNONAA 56 (L) 12/13/2020 12:32 PM   GFRAA 65 12/13/2020 12:32 PM    BMP Latest  Ref Rng & Units 03/14/2021 02/05/2021 02/04/2021  Glucose 65 - 99 mg/dL 118(H) 130(H) 122(H)  BUN 7 - 25 mg/dL 15 16 14   Creatinine 0.70 - 1.22 mg/dL 1.01 0.98 1.08  BUN/Creat Ratio 6 - 22 (calc) NOT APPLICABLE - -  Sodium 720 - 146 mmol/L 139 135 137  Potassium 3.5 - 5.3 mmol/L 4.5 3.8 4.2  Chloride 98 - 110 mmol/L 101 95(L) 95(L)  CO2 20 - 32 mmol/L 30 29 33(H)  Calcium 8.6 - 10.3 mg/dL 9.1 8.8(L) 8.8(L)    Current antihypertensive regimen:  Losartan 100 mg 1 tablet by mouth daily.  What recent interventions/DTPs have been made by any provider to improve Blood Pressure control since last CPP Visit: None.   Any recent hospitalizations or ED visits since last visit with CPP? Yes, Documented above.   Adherence Review: Is the patient currently on ACE/ARB medication? Losartan 100 mg   Does the patient have >5 day gap between last estimated fill dates? Per misc rpts, no.   Star Rating Drugs:  Losartan 100 mg 90 DS 01/27/21, Pravastatin 20 mg 90 DS 02/11/21.  Third unsuccessful telephone outreach was attempted today. The patient was referred to the pharmacist for assistance with care management and care coordination.   Follow-Up:Pharmacist Review   Charlann Lange, [place] Pharmacist Assistant [phone]

## 2021-04-11 ENCOUNTER — Ambulatory Visit (INDEPENDENT_AMBULATORY_CARE_PROVIDER_SITE_OTHER): Payer: PPO | Admitting: Family Medicine

## 2021-04-11 ENCOUNTER — Other Ambulatory Visit: Payer: Self-pay

## 2021-04-11 ENCOUNTER — Encounter: Payer: Self-pay | Admitting: Family Medicine

## 2021-04-11 VITALS — BP 120/60 | HR 97 | Temp 97.5°F | Resp 18

## 2021-04-11 DIAGNOSIS — S81802D Unspecified open wound, left lower leg, subsequent encounter: Secondary | ICD-10-CM

## 2021-04-11 DIAGNOSIS — S81802S Unspecified open wound, left lower leg, sequela: Secondary | ICD-10-CM

## 2021-04-11 MED ORDER — HYDROCODONE-ACETAMINOPHEN 10-325 MG PO TABS: 1.0000 | ORAL_TABLET | ORAL | 0 refills | Status: DC | PRN

## 2021-04-11 NOTE — Progress Notes (Signed)
Subjective:    Patient ID: Bernard Ramirez, male    DOB: 10-24-1930, 87 y.o.   MRN: [id number] 03/28/21  After almost 1 month, we have hidden impasse.  Please see the photograph above.  There is yellow slough adherent to the very superficial ulcer on his left lower shin.  It has not improved over the last 2 weeks despite doing Unna boot dressing changes twice weekly.  The swelling in his leg has been controlled by wound closure seems to have arrested.  He denies any pain or warmth or tenderness.  There is no fluctuance.  At that time, my plan was:  I am going to follow wound care suggestion of switching to collagenase topical enzymatic debridement.  We will apply Santyl daily to the wound.  It will then be covered with nonadherent gauze and wrapped in Coban.  We will do this dressing change daily until the slough has been removed.  Hopefully this will help expedite wound healing.  04/04/21 Wt Readings from Last 3 Encounters:  04/04/21 236 lb (107 kg)  03/28/21 240 lb 6.4 oz (109 kg)  03/21/21 242 lb (109.8 kg)   Wound looks better today.  The thick yellow slough seen last time in the photo above has thinned out considerably.  In fact some red granulation tissue can be seen poking through in several places.  The ulcer remains very shallow.  The size of the wound is essentially unchanged.  He denies any fever or chills or pain.  There is no surrounding erythema.  Despite not wearing the Unna boot, his weight has actually dropped 4 pounds and he does not appear fluid overloaded.  At that time, my plan was:  [facility] seems to have receded considerably using the [place] daily.  Continue the current dressing changes and reassess the patient in 1 week.  Continue to try to encourage the pain to exercise daily and walk more in an effort to avoid deconditioning which essentially put him in the nursing home last time.  No evidence of any secondary infection.  04/11/21 Wound looks amazing.  It has dramatically  improved from last week.  The superior ulcer has essentially healed.  There is a 2 mm x 5 mm small opening at the very superior portion.  However this was essentially the largest wound last week.  The inferior ulcer is approximately 4 mm x 10 mm.  This is almost virtually healed.  Is extremely shallow.  Compared to 2 weeks ago this is dramatically better.  The Santyl seems to be working extremely well. Past Medical History:  Diagnosis Date   Anemia    04/2012: H&H-10.2/31.5, MCV-77; 06/2012:12/38.9   Arthritis    Benign prostatic hypertrophy    s/p transurethral resection of the prostate   Chest pain    2004; with dyspnea, stress nuclear-normal EF + questionable inferior wall ischemia, normal coronary angiography;  2010-negative stress nuclear   CVA (cerebral infarction)    asymptomatic (seen on CT)   Diarrhea    h/o Hemoccult-positive stool   Dysrhythmia    Edema of both legs    GERD (gastroesophageal reflux disease)    History of blood transfusion    History of kidney stones    History of urinary tract infection    Hypertension    Jerking movements of extremities    left arm    Low back pain    Obstructive sleep apnea    pt states can not use the CPAP   Peripheral  neuropathy    lower legs and feet bilat    Persistent atrial fibrillation ([place]) 2013   Asymptomatic; diagnosed in 05/2012   Pneumonia    Prostate cancer ([place]) 2017   stage 4   Shortness of breath dyspnea    pt states only develops SOB if tries to do something too quickly   Upper GI bleed 1985   1985-peptic ulcer disease; initial hemigastrectomy; subsequent Billroth II   Urinary hesitancy    Vertigo    ED evaluation-06/2012   Past Surgical History:  Procedure Laterality Date   Billroth II     CHOLECYSTECTOMY     COLONOSCOPY  Approximately 2000   COLONOSCOPY  10/2012   Colonoscopy March 2014 at [facility], difficulty exam requiring fluoroscopy and overtube. Patient developed severe bradycardia again during his  procedure like he did [facility]. Multiple polyps removed which were tubular adenomas. Previously tattooed sites at 20 and 30 cm were free of any polypoid change. Left-sided diverticulosis noted.   COLONOSCOPY WITH ESOPHAGOGASTRODUODENOSCOPY (EGD)  06-17-2009   WUJ:WJXBJYNW tubular adenomas and 2 tubulovillous adenomas, incomplete, 3 clips placed   ESOPHAGOGASTRODUODENOSCOPY  06/17/2009   SLF: normal/chronic gastritis (non-h pylori)   ESOPHAGOGASTRODUODENOSCOPY N/A 05/11/2015   GNF:AOZHYQM anemai due to postgastrectomy state/moderate erosive gastritis   INGUINAL HERNIA REPAIR     Left   ROTATOR CUFF REPAIR  1991   Bilateral   TRANSURETHRAL RESECTION OF PROSTATE  06/2016   TRANSURETHRAL RESECTION OF PROSTATE N/A 12/27/2012   Procedure: TRANSURETHRAL RESECTION OF THE PROSTATE (TURP);  Surgeon: Marissa Nestle, MD;  Location: [facility];  Service: Urology;  Laterality: N/A;   TRANSURETHRAL RESECTION OF PROSTATE N/A 04/11/2016   Procedure: TRANSURETHRAL RESECTION OF THE PROSTATE (TURP);  Surgeon: Irine Seal, MD;  Location: [facility];  Service: Urology;  Laterality: N/A;   VAGOTOMY AND PYLOROPLASTY  1970s   Details of procedure uncertain; 5784O   Current Outpatient Medications on File Prior to Visit  Medication Sig Dispense Refill   cephALEXin (KEFLEX) 500 MG capsule Take 1 capsule (500 mg total) by mouth 3 (three) times daily. 21 capsule 0   clobetasol (TEMOVATE) 0.05 % external solution Apply 1 application topically 2 (two) times daily. 50 mL 2   clobetasol cream (TEMOVATE) 9.62 % Apply 1 application topically daily as needed (use as directed for skin). 30 g 0   clopidogrel (PLAVIX) 75 MG tablet TAKE 1 TABLET(75 MG) BY MOUTH DAILY 30 tablet 0   collagenase (SANTYL) ointment Apply 1 application topically daily. TO YELLOW SLOUGH TO LLE 15 g 0   furosemide (LASIX) 40 MG tablet Take 1 and 1/2 tabs in the AM and 1 tab in the afternoon 90 tablet 0   HYDROcodone-acetaminophen (NORCO) 10-325 MG  tablet Take 1 tablet by mouth every 4 (four) hours as needed (maxium dose 5 tabs PO QD). Chronic Pain. Dx: G89.4. 150 tablet 0   losartan (COZAAR) 100 MG tablet TAKE 1 TABLET(100 MG) BY MOUTH DAILY 30 tablet 0   meclizine (ANTIVERT) 25 MG tablet TAKE 1 TABLET(25 MG) BY MOUTH AT BEDTIME 30 tablet 3   NON FORMULARY Diet:Regular, NAS     NYSTATIN powder APPLY TO THE AFFECTED AREA TOPICALLY EVERY DAY AS NEEDED FOR RASH 60 g 3   pantoprazole (PROTONIX) 40 MG tablet TAKE 1 TABLET(40 MG) BY MOUTH DAILY 30 tablet 0   potassium chloride SA (KLOR-CON) 20 MEQ tablet TAKE 1 TABLET(20 MEQ) BY MOUTH DAILY 30 tablet 0   pravastatin (PRAVACHOL) 20 MG tablet TAKE  1 TABLET(20 MG) BY MOUTH DAILY 30 tablet 0   silver sulfADIAZINE (SILVADENE) 1 % cream Apply 1 application topically 2 (two) times daily. Special Instructions: Clean left lower leg with NS. Apply silvadene. Cover with gauze, wrap with kling, cover with an ace wrap 50 g 0   silver sulfADIAZINE (SILVADENE) 1 % cream Apply 1 application topically daily. 400 g 0   triamcinolone cream (KENALOG) 0.1 % Apply 1 application topically daily as needed (for irritation). 30 g 0   No current facility-administered medications on file prior to visit.   Allergies  Allergen Reactions   Contrast Media [Iodinated Diagnostic Agents]     Pt states he was given "dye" 20 yrs ago, anaphylaxis & syncope - was told to never have it again    Gabapentin Itching   Aspirin Other (See Comments)    Blistering    Latex Rash   Lipitor [Atorvastatin] Itching   Tape Rash    EKG leads   Social History   Socioeconomic History   Marital status: Married    Spouse name: [name]   Number of children: 5   Years of education: Not on file   Highest education level: Not on file  Occupational History   Occupation: Retired Pensions consultant: RETIRED  Tobacco Use   Smoking status: Former    Types: Cigars    Quit date: 08/22/2003    Years since quitting: 17.6   Smokeless  tobacco: Never   Tobacco comments:    Quit many years ago; few cigars per day; didn't inhale  Vaping Use   Vaping Use: Never used  Substance and Sexual Activity   Alcohol use: No    Alcohol/week: 1.0 standard drink    Types: 1 Standard drinks or equivalent per week   Drug use: No   Sexual activity: Not on file  Other Topics Concern   Not on file  Social History Narrative   Lives w/ wife in [place]   Retired truck Geophysicist/field seismologist   Social Determinants of Radio broadcast assistant Strain: Not on file  Food Insecurity: Not on file  Transportation Needs: Not on file  Physical Activity: Not on file  Stress: Not on file  Social Connections: Not on file  Intimate Partner Violence: Not on file      Review of Systems  All other systems reviewed and are negative.     Objective:   Physical Exam Vitals reviewed.  Constitutional:      General: He is not in acute distress.    Appearance: Normal appearance. He is obese. He is not ill-appearing or toxic-appearing.  Cardiovascular:     Rate and Rhythm: Normal rate. Rhythm irregular.     Heart sounds: Normal heart sounds. No murmur heard.   No friction rub. No gallop.  Pulmonary:     Effort: Pulmonary effort is normal. No respiratory distress.     Breath sounds: No stridor. Examination of the right-lower field reveals rales. Examination of the left-lower field reveals rales. Rales present. No wheezing or rhonchi.  Abdominal:     General: Abdomen is flat. Bowel sounds are normal. There is no distension.     Palpations: Abdomen is soft. There is no mass.     Tenderness: There is no abdominal tenderness. There is no guarding.  Musculoskeletal:     Right lower leg: Pitting Edema present.     Left lower leg: Deformity and laceration present. Pitting Edema present.     Right foot:  Decreased range of motion. Swelling and deformity present.       Legs:  Skin:    Findings: No erythema.  Neurological:     General: No focal deficit  present.     Mental Status: He is alert and oriented to person, place, and time.     Cranial Nerves: No cranial nerve deficit.     Sensory: No sensory deficit.     Motor: No weakness.     Coordination: Coordination normal.     Gait: Gait abnormal (Shuffling gait.  Requires a cane to ambulate).     Deep Tendon Reflexes: Reflexes normal.  Psychiatric:        Mood and Affect: Mood normal.        Thought Content: Thought content normal.        Judgment: Judgment normal.          Assessment & Plan:  Open wound of left lower extremity, sequela 85 to 90% better over the last 2 weeks.  Continue to apply Santyl to the yellow slough on a daily basis and to keep covered.  Anticipate 1 more week until completely healed.  Then I would resume compression hose as before.  Weight remains stable.  Given how good the wound is progressing, they do not have to come back next week simply to be rechecked.  Only return if there are complications or questions regarding this.  Seems to be on the road to recovery.

## 2021-04-14 ENCOUNTER — Telehealth: Payer: Self-pay | Admitting: Family Medicine

## 2021-04-14 NOTE — Telephone Encounter (Signed)
Received call from [facility] with [facility]; called for verbal orders for recertification for wound care. Orders will be 1 full week for 9 weeks. Please call back with verbal orders  at [phone].

## 2021-04-15 NOTE — Telephone Encounter (Signed)
VO given per VM.

## 2021-04-21 DIAGNOSIS — W19XXXD Unspecified fall, subsequent encounter: Secondary | ICD-10-CM | POA: Diagnosis not present

## 2021-04-21 DIAGNOSIS — I4819 Other persistent atrial fibrillation: Secondary | ICD-10-CM | POA: Diagnosis not present

## 2021-04-21 DIAGNOSIS — L03116 Cellulitis of left lower limb: Secondary | ICD-10-CM | POA: Diagnosis not present

## 2021-04-21 DIAGNOSIS — S81812D Laceration without foreign body, left lower leg, subsequent encounter: Secondary | ICD-10-CM | POA: Diagnosis not present

## 2021-04-28 ENCOUNTER — Other Ambulatory Visit: Payer: Self-pay | Admitting: Family Medicine

## 2021-04-30 ENCOUNTER — Other Ambulatory Visit: Payer: Self-pay | Admitting: Family Medicine

## 2021-05-05 ENCOUNTER — Other Ambulatory Visit: Payer: Self-pay

## 2021-05-05 ENCOUNTER — Ambulatory Visit (INDEPENDENT_AMBULATORY_CARE_PROVIDER_SITE_OTHER): Payer: PPO

## 2021-05-05 VITALS — Ht 72.0 in | Wt 236.0 lb

## 2021-05-05 DIAGNOSIS — L03116 Cellulitis of left lower limb: Secondary | ICD-10-CM | POA: Diagnosis not present

## 2021-05-05 DIAGNOSIS — Z7902 Long term (current) use of antithrombotics/antiplatelets: Secondary | ICD-10-CM | POA: Diagnosis not present

## 2021-05-05 DIAGNOSIS — S81812D Laceration without foreign body, left lower leg, subsequent encounter: Secondary | ICD-10-CM | POA: Diagnosis not present

## 2021-05-05 DIAGNOSIS — Z0000 Encounter for general adult medical examination without abnormal findings: Secondary | ICD-10-CM | POA: Diagnosis not present

## 2021-05-05 DIAGNOSIS — Z8546 Personal history of malignant neoplasm of prostate: Secondary | ICD-10-CM | POA: Diagnosis not present

## 2021-05-05 DIAGNOSIS — Z8673 Personal history of transient ischemic attack (TIA), and cerebral infarction without residual deficits: Secondary | ICD-10-CM | POA: Diagnosis not present

## 2021-05-05 DIAGNOSIS — I4819 Other persistent atrial fibrillation: Secondary | ICD-10-CM | POA: Diagnosis not present

## 2021-05-05 DIAGNOSIS — K219 Gastro-esophageal reflux disease without esophagitis: Secondary | ICD-10-CM | POA: Diagnosis not present

## 2021-05-05 DIAGNOSIS — W19XXXD Unspecified fall, subsequent encounter: Secondary | ICD-10-CM | POA: Diagnosis not present

## 2021-05-05 DIAGNOSIS — G4733 Obstructive sleep apnea (adult) (pediatric): Secondary | ICD-10-CM | POA: Diagnosis not present

## 2021-05-05 DIAGNOSIS — Z9181 History of falling: Secondary | ICD-10-CM | POA: Diagnosis not present

## 2021-05-05 DIAGNOSIS — Z79891 Long term (current) use of opiate analgesic: Secondary | ICD-10-CM | POA: Diagnosis not present

## 2021-05-05 NOTE — Progress Notes (Signed)
Subjective:   Bernard Ramirez is a 87 y.o. male who presents for Medicare Annual/Subsequent preventive examination. Virtual Visit via Telephone Note  I connected with  Karen Kinnard Bixby on 05/05/21 at  2:00 PM EDT by telephone and verified that I am speaking with the correct person using two identifiers.  Location: Patient: HOME Provider: [facility] Persons participating in the virtual visit: patient/Nurse Health Advisor   I discussed the limitations, risks, security and privacy concerns of performing an evaluation and management service by telephone and the availability of in person appointments. The patient expressed understanding and agreed to proceed.  Interactive audio and video telecommunications were attempted between this nurse and patient, however failed, due to patient having technical difficulties OR patient did not have access to video capability.  We continued and completed visit with audio only.  Some vital signs may be absent or patient reported.   Chriss Driver, LPN  Review of Systems     Cardiac Risk Factors include: advanced age (>28men, >81 women);hypertension;male gender;sedentary lifestyle;obesity (BMI >30kg/m2);dyslipidemia     Objective:    Today's Vitals   05/05/21 1408  Weight: 236 lb (107 kg)  Height: 6' (1.829 m)   Body mass index is 32.01 kg/m.  Advanced Directives 05/05/2021 02/08/2021 02/07/2021 02/01/2021 01/23/2021 10/13/2016 10/08/2016  Does Patient Have a Medical Advance Directive? No Yes Yes No No No No  Type of Advance Directive - Out of facility DNR (pink MOST or yellow form) Out of facility DNR (pink MOST or yellow form) - - - -  Does patient want to make changes to medical advance directive? - No - Patient declined No - Patient declined - - - -  Would patient like information on creating a medical advance directive? No - Patient declined - - No - Patient declined - No - Patient declined No - Patient declined  Pre-existing out of facility DNR  order (yellow form or pink MOST form) - Pink MOST form placed in chart (order not valid for inpatient use) - - - - -    Current Medications (verified) Outpatient Encounter Medications as of 05/05/2021  Medication Sig   cephALEXin (KEFLEX) 500 MG capsule Take 1 capsule (500 mg total) by mouth 3 (three) times daily.   clobetasol (TEMOVATE) 0.05 % external solution Apply 1 application topically 2 (two) times daily.   clobetasol cream (TEMOVATE) 5.68 % Apply 1 application topically daily as needed (use as directed for skin).   clopidogrel (PLAVIX) 75 MG tablet TAKE 1 TABLET(75 MG) BY MOUTH DAILY   collagenase (SANTYL) ointment Apply 1 application topically daily. TO YELLOW SLOUGH TO LLE   furosemide (LASIX) 40 MG tablet Take 1 and 1/2 tabs in the AM and 1 tab in the afternoon   HYDROcodone-acetaminophen (NORCO) 10-325 MG tablet Take 1 tablet by mouth every 4 (four) hours as needed (maxium dose 5 tabs PO QD). Chronic Pain. Dx: G89.4.   losartan (COZAAR) 100 MG tablet TAKE 1 TABLET(100 MG) BY MOUTH DAILY   meclizine (ANTIVERT) 25 MG tablet TAKE 1 TABLET(25 MG) BY MOUTH AT BEDTIME   NON FORMULARY Diet:Regular, NAS   NYSTATIN powder APPLY TOPICALLY TO THE AFFECTED AREA EVERY DAY AS NEEDED FOR RASH   pantoprazole (PROTONIX) 40 MG tablet TAKE 1 TABLET(40 MG) BY MOUTH DAILY   potassium chloride SA (KLOR-CON) 20 MEQ tablet TAKE 1 TABLET(20 MEQ) BY MOUTH DAILY   pravastatin (PRAVACHOL) 20 MG tablet TAKE 1 TABLET(20 MG) BY MOUTH DAILY   silver sulfADIAZINE (SILVADENE)  1 % cream Apply 1 application topically 2 (two) times daily. Special Instructions: Clean left lower leg with NS. Apply silvadene. Cover with gauze, wrap with kling, cover with an ace wrap   silver sulfADIAZINE (SILVADENE) 1 % cream Apply 1 application topically daily.   triamcinolone cream (KENALOG) 0.1 % Apply 1 application topically daily as needed (for irritation).   No facility-administered encounter medications on file as of 05/05/2021.     Allergies (verified) Contrast media [iodinated diagnostic agents], Gabapentin, Aspirin, Latex, Lipitor [atorvastatin], and Tape   History: Past Medical History:  Diagnosis Date   Anemia    04/2012: H&H-10.2/31.5, MCV-77; 06/2012:12/38.9   Arthritis    Benign prostatic hypertrophy    s/p transurethral resection of the prostate   Chest pain    2004; with dyspnea, stress nuclear-normal EF + questionable inferior wall ischemia, normal coronary angiography;  2010-negative stress nuclear   CVA (cerebral infarction)    asymptomatic (seen on CT)   Diarrhea    h/o Hemoccult-positive stool   Dysrhythmia    Edema of both legs    GERD (gastroesophageal reflux disease)    History of blood transfusion    History of kidney stones    History of urinary tract infection    Hypertension    Jerking movements of extremities    left arm    Low back pain    Obstructive sleep apnea    pt states can not use the CPAP   Peripheral neuropathy    lower legs and feet bilat    Persistent atrial fibrillation ([place]) 2013   Asymptomatic; diagnosed in 05/2012   Pneumonia    Prostate cancer ([place]) 2017   stage 4   Shortness of breath dyspnea    pt states only develops SOB if tries to do something too quickly   Upper GI bleed 1985   1985-peptic ulcer disease; initial hemigastrectomy; subsequent Billroth II   Urinary hesitancy    Vertigo    ED evaluation-06/2012   Past Surgical History:  Procedure Laterality Date   Billroth II     CHOLECYSTECTOMY     COLONOSCOPY  Approximately 2000   COLONOSCOPY  10/2012   Colonoscopy March 2014 at [facility], difficulty exam requiring fluoroscopy and overtube. Patient developed severe bradycardia again during his procedure like he did [facility]. Multiple polyps removed which were tubular adenomas. Previously tattooed sites at 20 and 30 cm were free of any polypoid change. Left-sided diverticulosis noted.   COLONOSCOPY WITH ESOPHAGOGASTRODUODENOSCOPY (EGD)   06-17-2009   JJK:KXFGHWEX tubular adenomas and 2 tubulovillous adenomas, incomplete, 3 clips placed   ESOPHAGOGASTRODUODENOSCOPY  06/17/2009   SLF: normal/chronic gastritis (non-h pylori)   ESOPHAGOGASTRODUODENOSCOPY N/A 05/11/2015   HBZ:JIRCVEL anemai due to postgastrectomy state/moderate erosive gastritis   INGUINAL HERNIA REPAIR     Left   ROTATOR CUFF REPAIR  1991   Bilateral   TRANSURETHRAL RESECTION OF PROSTATE  06/2016   TRANSURETHRAL RESECTION OF PROSTATE N/A 12/27/2012   Procedure: TRANSURETHRAL RESECTION OF THE PROSTATE (TURP);  Surgeon: Marissa Nestle, MD;  Location: [facility];  Service: Urology;  Laterality: N/A;   TRANSURETHRAL RESECTION OF PROSTATE N/A 04/11/2016   Procedure: TRANSURETHRAL RESECTION OF THE PROSTATE (TURP);  Surgeon: Irine Seal, MD;  Location: [facility];  Service: Urology;  Laterality: N/A;   VAGOTOMY AND PYLOROPLASTY  1970s   Details of procedure uncertain; 3810F   Family History  Problem Relation Age of Onset   Diabetes Mellitus II Mother    Allergic rhinitis Mother  Lung disease Father    Allergic rhinitis Father    Angioedema Neg Hx    Asthma Neg Hx    Atopy Neg Hx    Eczema Neg Hx    Immunodeficiency Neg Hx    Urticaria Neg Hx    Cystic fibrosis Neg Hx    Lupus Neg Hx    Emphysema Neg Hx    Migraines Neg Hx    Social History   Socioeconomic History   Marital status: Married    Spouse name: [name]   Number of children: 5   Years of education: Not on file   Highest education level: Not on file  Occupational History   Occupation: Retired Pensions consultant: RETIRED  Tobacco Use   Smoking status: Former    Types: Cigars    Quit date: 08/22/2003    Years since quitting: 17.7   Smokeless tobacco: Never   Tobacco comments:    Quit many years ago; few cigars per day; didn't inhale  Vaping Use   Vaping Use: Never used  Substance and Sexual Activity   Alcohol use: No    Alcohol/week: 1.0 standard drink    Types: 1 Standard drinks or  equivalent per week   Drug use: No   Sexual activity: Not on file  Other Topics Concern   Not on file  Social History Narrative   Lives w/ wife in [place]   Retired truck Geophysicist/field seismologist   Social Determinants of Radio broadcast assistant Strain: Low Risk    Difficulty of Paying Living Expenses: Not hard at all  Food Insecurity: No Food Insecurity   Worried About Charity fundraiser in the Last Year: Never true   Arboriculturist in the Last Year: Never true  Transportation Needs: No Transportation Needs   Lack of Transportation (Medical): No   Lack of Transportation (Non-Medical): No  Physical Activity: Inactive   Days of Exercise per Week: 0 days   Minutes of Exercise per Session: 0 min  Stress: No Stress Concern Present   Feeling of Stress : Not at all  Social Connections: Socially Isolated   Frequency of Communication with Friends and Family: Never   Frequency of Social Gatherings with Friends and Family: Once a week   Attends Religious Services: Never   Marine scientist or Organizations: No   Attends Music therapist: Never   Marital Status: Married    Tobacco Counseling Counseling given: Not Answered Tobacco comments: Quit many years ago; few cigars per day; didn't inhale   Clinical Intake:  Pre-visit preparation completed: Yes  Pain : No/denies pain     BMI - recorded: 32.01 Nutritional Status: BMI > 30  Obese Nutritional Risks: None Diabetes: No  How often do you need to have someone help you when you read instructions, pamphlets, or other written materials from your doctor or pharmacy?: 1 - Never  Diabetic?NO   Interpreter Needed?: No  Information entered by :: Randal Buba, LPN   Activities of Daily Living In your present state of health, do you have any difficulty performing the following activities: 05/05/2021 02/01/2021  Hearing? Y N  Vision? Y N  Difficulty concentrating or making decisions? Y N  Comment Pt has memory  issues per pt's wife. -  Walking or climbing stairs? Y Y  Dressing or bathing? Y Y  Comment Pt's wife states she assists patient with bathing and dressing. -  Doing errands, shopping? Tempie Donning  Comment Wife runs errands for pt. -  Preparing Food and eating ? N -  Using the Toilet? N -  In the past six months, have you accidently leaked urine? Y -  Do you have problems with loss of bowel control? Y -  Comment Incontinent of bowel and bladder per pt's wife, [name]. -  Managing your Medications? Y -  Managing your Finances? Y -  Housekeeping or managing your Housekeeping? Y -  Some recent data might be hidden    Patient Care Team: [name], MD as PCP - General (Family Medicine) Rothbart, Cristopher Estimable, MD (Inactive) as Attending Physician (Cardiology) Danie Binder, MD (Inactive) as Attending Physician (Gastroenterology)  Indicate any recent Medical Services you may have received from other than [facility] providers in the past year (date may be approximate).     Assessment:   This is a routine wellness examination for [place].  Hearing/Vision screen Hearing Screening - Comments:: Does have hearing issues but does not wear hearing aids per wife, [name]. Vision Screening - Comments:: Glasses. [facility]. Has been 2-3 years since last exam.   Dietary issues and exercise activities discussed: Current Exercise Habits: The patient does not participate in regular exercise at present, Exercise limited by: cardiac condition(s);orthopedic condition(s)   Goals Addressed             This Visit's Progress    Have 3 meals a day       Eat healthy meals       Depression Screen PHQ 2/9 Scores 05/05/2021 09/13/2020 12/09/2019  PHQ - 2 Score 0 0 0    Fall Risk Fall Risk  05/05/2021 02/28/2021 02/01/2021 09/13/2020 02/19/2020  Falls in the past year? 1 1 1  0 1  Comment - - - - -  Number falls in past yr: 0 0 0 0 1  Comment - - - - -  Injury with Fall? 1 0 1 0 0  Risk for fall due to :  Impaired balance/gait;Impaired mobility;Impaired vision;History of fall(s) - History of fall(s) - History of fall(s);Impaired mobility;Impaired balance/gait  Follow up Falls prevention discussed - Falls evaluation completed Falls evaluation completed Falls prevention discussed;Education provided;Falls evaluation completed;Follow up appointment    FALL RISK PREVENTION PERTAINING TO THE HOME:  Any stairs in or around the home? No  If so, are there any without handrails? No  Home free of loose throw rugs in walkways, pet beds, electrical cords, etc? Yes  Adequate lighting in your home to reduce risk of falls? Yes   ASSISTIVE DEVICES UTILIZED TO PREVENT FALLS:  Life alert? No  Use of a cane, walker or w/c? Yes  Grab bars in the bathroom? Yes  Shower chair or bench in shower? No  Elevated toilet seat or a handicapped toilet? Yes   TIMED UP AND GO:  Was the test performed? No . Phone visit.     Cognitive Function:Unable to assess. Visit done with patient's wife, [name]. [name] states pt does not like to talk on the phone. [name] also states patient does have difficulty remembering and she handles all finances, medications and appointments.       Immunizations Immunization History  Administered Date(s) Administered   Fluad Quad(high Dose 65+) 06/10/2020   Influenza Split 05/23/2016, 06/06/2017   Influenza, High Dose Seasonal PF 06/13/2018, 04/22/2019   Influenza-Unspecified 06/06/2017, 06/13/2018   PFIZER(Purple Top)SARS-COV-2 Vaccination 09/24/2019, 10/18/2019, 07/26/2020   Pneumococcal Conjugate-13 02/12/2014, 06/13/2015   Pneumococcal Polysaccharide-23 07/04/2016   Tdap 02/12/2014, 12/28/2019,  01/24/2021    TDAP status: Up to date  Flu Vaccine status: Due, Education has been provided regarding the importance of this vaccine. Advised may receive this vaccine at local pharmacy or Health Dept. Aware to provide a copy of the vaccination record if obtained from local pharmacy or Health  Dept. Verbalized acceptance and understanding.  Pneumococcal vaccine status: Up to date  Covid-19 vaccine status: Completed vaccines  Qualifies for Shingles Vaccine? Yes   Zostavax completed No   Shingrix Completed?: No.    Education has been provided regarding the importance of this vaccine. Patient has been advised to call insurance company to determine out of pocket expense if they have not yet received this vaccine. Advised may also receive vaccine at local pharmacy or Health Dept. Verbalized acceptance and understanding.  Screening Tests Health Maintenance  Topic Date Due   COVID-19 Vaccine (4 - Booster for Pfizer series) 10/18/2020   INFLUENZA VACCINE  03/21/2021   Zoster Vaccines- Shingrix (1 of 2) 05/31/2021 (Originally 01/24/1950)   TETANUS/TDAP  01/25/2031   PNA vac Low Risk Adult  Completed   HPV VACCINES  Aged Out    Health Maintenance  Health Maintenance Due  Topic Date Due   COVID-19 Vaccine (4 - Booster for Pfizer series) 10/18/2020   INFLUENZA VACCINE  03/21/2021    Colorectal cancer screening: No longer required.   Lung Cancer Screening: (Low Dose CT Chest recommended if Age 36-80 years, 30 pack-year currently smoking OR have quit w/in 15years.) does not qualify.    Additional Screening:  Hepatitis C Screening: does not qualify  Vision Screening: Recommended annual ophthalmology exams for early detection of glaucoma and other disorders of the eye. Is the patient up to date with their annual eye exam?  No  Who is the provider or what is the name of the office in which the patient attends annual eye exams? Unsure of name. If pt is not established with a provider, would they like to be referred to a provider to establish care? No .   Dental Screening: Recommended annual dental exams for proper oral hygiene  Community Resource Referral / Chronic Care Management: CRR required this visit?  No   CCM required this visit?  No      Plan:     I have  personally reviewed and noted the following in the patient's chart:   Medical and social history Use of alcohol, tobacco or illicit drugs  Current medications and supplements including opioid prescriptions. Patient is currently taking opioid prescriptions. Information provided to patient regarding non-opioid alternatives. Patient advised to discuss non-opioid treatment plan with their provider. Functional ability and status Nutritional status Physical activity Advanced directives List of other physicians Hospitalizations, surgeries, and ER visits in previous 12 months Vitals Screenings to include cognitive, depression, and falls Referrals and appointments  In addition, I have reviewed and discussed with patient certain preventive protocols, quality metrics, and best practice recommendations. A written personalized care plan for preventive services as well as general preventive health recommendations were provided to patient.     Chriss Driver, LPN   02/01/4314   Nurse Notes: Pt overdue for eye exam. When asked by wife if he wanted referral to an eye doctor, patient declined. Pt does not currently have a shower chair but needs one. Wife is using the seat from the handicapped toilet for pt to sit on while bathing. Advised wife that Medicare should pay for a shower chair with Rx. Advised pt's wife to ask  for Rx at next visit on 06/14/21.

## 2021-05-05 NOTE — Patient Instructions (Signed)
Mr. Bonebrake , Thank you for taking time to come for your Medicare Wellness Visit. I appreciate your ongoing commitment to your health goals. Please review the following plan we discussed and let me know if I can assist you in the future.   Screening recommendations/referrals: Colonoscopy: No longer required due to age. Recommended yearly ophthalmology/optometry visit for glaucoma screening and checkup Recommended yearly dental visit for hygiene and checkup  Vaccinations: Influenza vaccine: 06/10/2020, Repeat annually  Pneumococcal vaccine: 06/13/2015 & 07/04/2016 Tdap vaccine: 01/24/2021 Repeat in 10 years  Shingles vaccine: Shingrix discussed. Please contact your pharmacy for coverage information.     Covid-19: 09/24/19 10/18/19 07/26/20  Advanced directives: Advance directive discussed with you today. Even though you declined this today, please call our office should you change your mind, and we can give you the proper paperwork for you to fill out.   Conditions/risks identified: Aim for 30 minutes of exercise or brisk walking each day, drink 6-8 glasses of water and eat lots of fruits and vegetables.   Next appointment: Follow up in one year for your annual wellness visit.   Preventive Care 85 Years and Older, Male  Preventive care refers to lifestyle choices and visits with your health care provider that can promote health and wellness. What does preventive care include? A yearly physical exam. This is also called an annual well check. Dental exams once or twice a year. Routine eye exams. Ask your health care provider how often you should have your eyes checked. Personal lifestyle choices, including: Daily care of your teeth and gums. Regular physical activity. Eating a healthy diet. Avoiding tobacco and drug use. Limiting alcohol use. Practicing safe sex. Taking low doses of aspirin every day. Taking vitamin and mineral supplements as recommended by your health care  provider. What happens during an annual well check? The services and screenings done by your health care provider during your annual well check will depend on your age, overall health, lifestyle risk factors, and family history of disease. Counseling  Your health care provider may ask you questions about your: Alcohol use. Tobacco use. Drug use. Emotional well-being. Home and relationship well-being. Sexual activity. Eating habits. History of falls. Memory and ability to understand (cognition). Work and work Statistician. Screening  You may have the following tests or measurements: Height, weight, and BMI. Blood pressure. Lipid and cholesterol levels. These may be checked every 5 years, or more frequently if you are over 54 years old. Skin check. Lung cancer screening. You may have this screening every year starting at age 85 if you have a 30-pack-year history of smoking and currently smoke or have quit within the past 15 years. Fecal occult blood test (FOBT) of the stool. You may have this test every year starting at age 85. Flexible sigmoidoscopy or colonoscopy. You may have a sigmoidoscopy every 5 years or a colonoscopy every 10 years starting at age 85. Prostate cancer screening. Recommendations will vary depending on your family history and other risks. Hepatitis C blood test. Hepatitis B blood test. Sexually transmitted disease (STD) testing. Diabetes screening. This is done by checking your blood sugar (glucose) after you have not eaten for a while (fasting). You may have this done every 1-3 years. Abdominal aortic aneurysm (AAA) screening. You may need this if you are a current or former smoker. Osteoporosis. You may be screened starting at age 85 if you are at high risk. Talk with your health care provider about your test results, treatment options, and if necessary,  the need for more tests. Vaccines  Your health care provider may recommend certain vaccines, such  as: Influenza vaccine. This is recommended every year. Tetanus, diphtheria, and acellular pertussis (Tdap, Td) vaccine. You may need a Td booster every 10 years. Zoster vaccine. You may need this after age 6. Pneumococcal 13-valent conjugate (PCV13) vaccine. One dose is recommended after age 85. Pneumococcal polysaccharide (PPSV23) vaccine. One dose is recommended after age 85. Talk to your health care provider about which screenings and vaccines you need and how often you need them. This information is not intended to replace advice given to you by your health care provider. Make sure you discuss any questions you have with your health care provider. Document Released: 09/03/2015 Document Revised: 04/26/2016 Document Reviewed: 06/08/2015 Elsevier Interactive Patient Education  2017 [place] Prevention in the Home Falls can cause injuries. They can happen to people of all ages. There are many things you can do to make your home safe and to help prevent falls. What can I do on the outside of my home? Regularly fix the edges of walkways and driveways and fix any cracks. Remove anything that might make you trip as you walk through a door, such as a raised step or threshold. Trim any bushes or trees on the path to your home. Use bright outdoor lighting. Clear any walking paths of anything that might make someone trip, such as rocks or tools. Regularly check to see if handrails are loose or broken. Make sure that both sides of any steps have handrails. Any raised decks and porches should have guardrails on the edges. Have any leaves, snow, or ice cleared regularly. Use sand or salt on walking paths during winter. Clean up any spills in your garage right away. This includes oil or grease spills. What can I do in the bathroom? Use night lights. Install grab bars by the toilet and in the tub and shower. Do not use towel bars as grab bars. Use non-skid mats or decals in the tub or  shower. If you need to sit down in the shower, use a plastic, non-slip stool. Keep the floor dry. Clean up any water that spills on the floor as soon as it happens. Remove soap buildup in the tub or shower regularly. Attach bath mats securely with double-sided non-slip rug tape. Do not have throw rugs and other things on the floor that can make you trip. What can I do in the bedroom? Use night lights. Make sure that you have a light by your bed that is easy to reach. Do not use any sheets or blankets that are too big for your bed. They should not hang down onto the floor. Have a firm chair that has side arms. You can use this for support while you get dressed. Do not have throw rugs and other things on the floor that can make you trip. What can I do in the kitchen? Clean up any spills right away. Avoid walking on wet floors. Keep items that you use a lot in easy-to-reach places. If you need to reach something above you, use a strong step stool that has a grab bar. Keep electrical cords out of the way. Do not use floor polish or wax that makes floors slippery. If you must use wax, use non-skid floor wax. Do not have throw rugs and other things on the floor that can make you trip. What can I do with my stairs? Do not leave any items on  the stairs. Make sure that there are handrails on both sides of the stairs and use them. Fix handrails that are broken or loose. Make sure that handrails are as long as the stairways. Check any carpeting to make sure that it is firmly attached to the stairs. Fix any carpet that is loose or worn. Avoid having throw rugs at the top or bottom of the stairs. If you do have throw rugs, attach them to the floor with carpet tape. Make sure that you have a light switch at the top of the stairs and the bottom of the stairs. If you do not have them, ask someone to add them for you. What else can I do to help prevent falls? Wear shoes that: Do not have high heels. Have  rubber bottoms. Are comfortable and fit you well. Are closed at the toe. Do not wear sandals. If you use a stepladder: Make sure that it is fully opened. Do not climb a closed stepladder. Make sure that both sides of the stepladder are locked into place. Ask someone to hold it for you, if possible. Clearly mark and make sure that you can see: Any grab bars or handrails. First and last steps. Where the edge of each step is. Use tools that help you move around (mobility aids) if they are needed. These include: Canes. Walkers. Scooters. Crutches. Turn on the lights when you go into a dark area. Replace any light bulbs as soon as they burn out. Set up your furniture so you have a clear path. Avoid moving your furniture around. If any of your floors are uneven, fix them. If there are any pets around you, be aware of where they are. Review your medicines with your doctor. Some medicines can make you feel dizzy. This can increase your chance of falling. Ask your doctor what other things that you can do to help prevent falls. This information is not intended to replace advice given to you by your health care provider. Make sure you discuss any questions you have with your health care provider. Document Released: 06/03/2009 Document Revised: 01/13/2016 Document Reviewed: 09/11/2014 Elsevier Interactive Patient Education  2017 [employer].

## 2021-05-11 DIAGNOSIS — K219 Gastro-esophageal reflux disease without esophagitis: Secondary | ICD-10-CM | POA: Diagnosis not present

## 2021-05-11 DIAGNOSIS — Z9181 History of falling: Secondary | ICD-10-CM | POA: Diagnosis not present

## 2021-05-11 DIAGNOSIS — Z8673 Personal history of transient ischemic attack (TIA), and cerebral infarction without residual deficits: Secondary | ICD-10-CM | POA: Diagnosis not present

## 2021-05-11 DIAGNOSIS — S81812D Laceration without foreign body, left lower leg, subsequent encounter: Secondary | ICD-10-CM | POA: Diagnosis not present

## 2021-05-11 DIAGNOSIS — L03116 Cellulitis of left lower limb: Secondary | ICD-10-CM | POA: Diagnosis not present

## 2021-05-11 DIAGNOSIS — I4819 Other persistent atrial fibrillation: Secondary | ICD-10-CM | POA: Diagnosis not present

## 2021-05-11 DIAGNOSIS — W19XXXD Unspecified fall, subsequent encounter: Secondary | ICD-10-CM | POA: Diagnosis not present

## 2021-05-11 DIAGNOSIS — Z8546 Personal history of malignant neoplasm of prostate: Secondary | ICD-10-CM | POA: Diagnosis not present

## 2021-05-11 DIAGNOSIS — Z79891 Long term (current) use of opiate analgesic: Secondary | ICD-10-CM | POA: Diagnosis not present

## 2021-05-11 DIAGNOSIS — Z7902 Long term (current) use of antithrombotics/antiplatelets: Secondary | ICD-10-CM | POA: Diagnosis not present

## 2021-05-11 DIAGNOSIS — G4733 Obstructive sleep apnea (adult) (pediatric): Secondary | ICD-10-CM | POA: Diagnosis not present

## 2021-05-13 ENCOUNTER — Encounter: Payer: Self-pay | Admitting: Family Medicine

## 2021-05-13 ENCOUNTER — Other Ambulatory Visit: Payer: Self-pay

## 2021-05-13 ENCOUNTER — Ambulatory Visit (INDEPENDENT_AMBULATORY_CARE_PROVIDER_SITE_OTHER): Payer: PPO | Admitting: Family Medicine

## 2021-05-13 VITALS — BP 120/82 | HR 68 | Temp 98.1°F | Resp 16 | Ht 72.0 in | Wt 241.0 lb

## 2021-05-13 DIAGNOSIS — M170 Bilateral primary osteoarthritis of knee: Secondary | ICD-10-CM | POA: Diagnosis not present

## 2021-05-13 MED ORDER — HYDROCODONE-ACETAMINOPHEN 10-325 MG PO TABS: 1.0000 | ORAL_TABLET | ORAL | 0 refills | Status: DC | PRN

## 2021-05-13 NOTE — Progress Notes (Signed)
Subjective:    Patient ID: Bernard Ramirez, male    DOB: 1930-09-17, 87 y.o.   MRN: [id number]  Patient presents today complaining of bilateral knee pain.  He is requesting cortisone shot in both knees.   He is using opiates 4-5 times a day.  The cortisone shots give him 4 to 5 weeks of temporary relief until the knee pain returns with severity.  It hurts to even stand and walk into the bathroom.  He is miserable.  He is a poor surgical candidate for knee replacement.  He is unable to take NSAIDs due to his history of GI bleed which is the reason he is not on appropriate anticoagulation for atrial fibrillation Past Medical History:  Diagnosis Date   Anemia    04/2012: H&H-10.2/31.5, MCV-77; 06/2012:12/38.9   Arthritis    Benign prostatic hypertrophy    s/p transurethral resection of the prostate   Chest pain    2004; with dyspnea, stress nuclear-normal EF + questionable inferior wall ischemia, normal coronary angiography;  2010-negative stress nuclear   CVA (cerebral infarction)    asymptomatic (seen on CT)   Diarrhea    h/o Hemoccult-positive stool   Dysrhythmia    Edema of both legs    GERD (gastroesophageal reflux disease)    History of blood transfusion    History of kidney stones    History of urinary tract infection    Hypertension    Jerking movements of extremities    left arm    Low back pain    Obstructive sleep apnea    pt states can not use the CPAP   Peripheral neuropathy    lower legs and feet bilat    Persistent atrial fibrillation (Bernard Ramirez) 2013   Asymptomatic; diagnosed in 05/2012   Pneumonia    Prostate cancer (Bernard Ramirez) 2017   stage 4   Shortness of breath dyspnea    pt states only develops SOB if tries to do something too quickly   Upper GI bleed 1985   1985-peptic ulcer disease; initial hemigastrectomy; subsequent Billroth II   Urinary hesitancy    Vertigo    ED evaluation-06/2012   Past Surgical History:  Procedure Laterality Date   Billroth II      CHOLECYSTECTOMY     COLONOSCOPY  Approximately 2000   COLONOSCOPY  10/2012   Colonoscopy March 2014 at [facility], difficulty exam requiring fluoroscopy and overtube. Patient developed severe bradycardia again during his procedure like he did [facility]. Multiple polyps removed which were tubular adenomas. Previously tattooed sites at 20 and 30 cm were free of any polypoid change. Left-sided diverticulosis noted.   COLONOSCOPY WITH ESOPHAGOGASTRODUODENOSCOPY (EGD)  06-17-2009   TZG:YFVCBSWH tubular adenomas and 2 tubulovillous adenomas, incomplete, 3 clips placed   ESOPHAGOGASTRODUODENOSCOPY  06/17/2009   SLF: normal/chronic gastritis (non-h pylori)   ESOPHAGOGASTRODUODENOSCOPY N/A 05/11/2015   QPR:FFMBWGY anemai due to postgastrectomy state/moderate erosive gastritis   INGUINAL HERNIA REPAIR     Left   ROTATOR CUFF REPAIR  1991   Bilateral   TRANSURETHRAL RESECTION OF PROSTATE  06/2016   TRANSURETHRAL RESECTION OF PROSTATE N/A 12/27/2012   Procedure: TRANSURETHRAL RESECTION OF THE PROSTATE (TURP);  Surgeon: Marissa Nestle, MD;  Location: [facility];  Service: Urology;  Laterality: N/A;   TRANSURETHRAL RESECTION OF PROSTATE N/A 04/11/2016   Procedure: TRANSURETHRAL RESECTION OF THE PROSTATE (TURP);  Surgeon: Irine Seal, MD;  Location: [facility];  Service: Urology;  Laterality: N/A;   VAGOTOMY AND PYLOROPLASTY  1970s  Details of procedure uncertain; 8413K   Current Outpatient Medications on File Prior to Visit  Medication Sig Dispense Refill   cephALEXin (KEFLEX) 500 MG capsule Take 1 capsule (500 mg total) by mouth 3 (three) times daily. 21 capsule 0   clobetasol (TEMOVATE) 0.05 % external solution Apply 1 application topically 2 (two) times daily. 50 mL 2   clobetasol cream (TEMOVATE) 4.40 % Apply 1 application topically daily as needed (use as directed for skin). 30 g 0   clopidogrel (PLAVIX) 75 MG tablet TAKE 1 TABLET(75 MG) BY MOUTH DAILY 30 tablet 0   collagenase (SANTYL) ointment Apply 1  application topically daily. TO YELLOW SLOUGH TO LLE 15 g 0   furosemide (LASIX) 40 MG tablet Take 1 and 1/2 tabs in the AM and 1 tab in the afternoon 90 tablet 0   losartan (COZAAR) 100 MG tablet TAKE 1 TABLET(100 MG) BY MOUTH DAILY 90 tablet 0   meclizine (ANTIVERT) 25 MG tablet TAKE 1 TABLET(25 MG) BY MOUTH AT BEDTIME 30 tablet 3   NON FORMULARY Diet:Regular, NAS     NYSTATIN powder APPLY TOPICALLY TO THE AFFECTED AREA EVERY DAY AS NEEDED FOR RASH 60 g 3   pantoprazole (PROTONIX) 40 MG tablet TAKE 1 TABLET(40 MG) BY MOUTH DAILY 30 tablet 0   potassium chloride SA (KLOR-CON) 20 MEQ tablet TAKE 1 TABLET(20 MEQ) BY MOUTH DAILY 30 tablet 0   pravastatin (PRAVACHOL) 20 MG tablet TAKE 1 TABLET(20 MG) BY MOUTH DAILY 30 tablet 0   silver sulfADIAZINE (SILVADENE) 1 % cream Apply 1 application topically 2 (two) times daily. Special Instructions: Clean left lower leg with NS. Apply silvadene. Cover with gauze, wrap with kling, cover with an ace wrap 50 g 0   silver sulfADIAZINE (SILVADENE) 1 % cream Apply 1 application topically daily. 400 g 0   triamcinolone cream (KENALOG) 0.1 % Apply 1 application topically daily as needed (for irritation). 30 g 0   No current facility-administered medications on file prior to visit.   Allergies  Allergen Reactions   Contrast Media [Iodinated Diagnostic Agents]     Pt states he was given "dye" 20 yrs ago, anaphylaxis & syncope - was told to never have it again    Gabapentin Itching   Aspirin Other (See Comments)    Blistering    Latex Rash   Lipitor [Atorvastatin] Itching   Tape Rash    EKG leads   Social History   Socioeconomic History   Marital status: Married    Spouse name: [name]   Number of children: 5   Years of education: Not on file   Highest education level: Not on file  Occupational History   Occupation: Retired Pensions consultant: RETIRED  Tobacco Use   Smoking status: Former    Types: Cigars    Quit date: 08/22/2003    Years  since quitting: 17.7   Smokeless tobacco: Never   Tobacco comments:    Quit many years ago; few cigars per day; didn't inhale  Vaping Use   Vaping Use: Never used  Substance and Sexual Activity   Alcohol use: No    Alcohol/week: 1.0 standard drink    Types: 1 Standard drinks or equivalent per week   Drug use: No   Sexual activity: Not on file  Other Topics Concern   Not on file  Social History Narrative   Lives w/ wife in [place]   Retired truck Geophysicist/field seismologist   Social Determinants of Health  Financial Resource Strain: Low Risk    Difficulty of Paying Living Expenses: Not hard at all  Food Insecurity: No Food Insecurity   Worried About Charity fundraiser in the Last Year: Never true   Ran Out of Food in the Last Year: Never true  Transportation Needs: No Transportation Needs   Lack of Transportation (Medical): No   Lack of Transportation (Non-Medical): No  Physical Activity: Inactive   Days of Exercise per Week: 0 days   Minutes of Exercise per Session: 0 min  Stress: No Stress Concern Present   Feeling of Stress : Not at all  Social Connections: Socially Isolated   Frequency of Communication with Friends and Family: Never   Frequency of Social Gatherings with Friends and Family: Once a week   Attends Religious Services: Never   Marine scientist or Organizations: No   Attends Music therapist: Never   Marital Status: Married  Human resources officer Violence: Not At Risk   Fear of Current or Ex-Partner: No   Emotionally Abused: No   Physically Abused: No   Sexually Abused: No      Review of Systems  All other systems reviewed and are negative.     Objective:   Physical Exam Vitals reviewed.  Constitutional:      General: He is not in acute distress.    Appearance: Normal appearance. He is obese. He is not ill-appearing or toxic-appearing.  Cardiovascular:     Rate and Rhythm: Normal rate. Rhythm irregular.     Heart sounds: Normal heart sounds. No  murmur heard.   No friction rub. No gallop.  Pulmonary:     Effort: Pulmonary effort is normal. No respiratory distress.     Breath sounds: Normal breath sounds. No stridor. No wheezing, rhonchi or rales.  Abdominal:     General: Abdomen is flat. Bowel sounds are normal. There is no distension.     Palpations: Abdomen is soft. There is no mass.     Tenderness: There is no abdominal tenderness. There is no guarding.  Musculoskeletal:     Right lower leg: 2+ Edema present.     Left lower leg: 2+ Edema present.     Right foot: Decreased range of motion. Swelling and deformity present.  Skin:    Findings: No erythema.  Neurological:     General: No focal deficit present.     Mental Status: He is alert and oriented to person, place, and time.     Cranial Nerves: No cranial nerve deficit.     Sensory: No sensory deficit.     Motor: No weakness.     Coordination: Coordination normal.     Gait: Gait abnormal (Shuffling gait.  Requires a cane to ambulate).     Deep Tendon Reflexes: Reflexes normal.  Psychiatric:        Mood and Affect: Mood normal.        Thought Content: Thought content normal.        Judgment: Judgment normal.          Assessment & Plan:  Bilateral primary osteoarthritis of knee  Using sterile technique, I injected the right knee with 2 cc lidocaine, 2 cc of bupivacaine, and 2 cc of 40 mg/mL Kenalog. The patient tolerated the procedure well without complication. I then used sterile technique to inject the left knee with 2 cc lidocaine, 2 cc of bupivacaine, and 2 cc of 40 mg/mL Kenalog. The patient tolerated that procedure as well.  I would like to set the patient up to see an orthopedist to discuss possible viscosupplementation.  I question if this may give him more pain relief and more long-lasting pain relief to my cortisone injections.  They are willing to talk to orthopedics about this.  Therefore I will arrange the appointment.

## 2021-05-23 ENCOUNTER — Other Ambulatory Visit: Payer: Self-pay | Admitting: Adult Health

## 2021-05-27 ENCOUNTER — Telehealth: Payer: Self-pay | Admitting: Pharmacist

## 2021-05-27 NOTE — Progress Notes (Signed)
Chronic Care Management Pharmacy Assistant   Name: Bernard Ramirez  MRN: [id number] DOB: 02-17-1931  Reason for Encounter: Disease State For HTN.    Conditions to be addressed/monitored: Hypertension, afib, CHF, CKD, hyperlipidemia.  Recent office visits:  05/13/21 Dr. Dennard Schaumann For B Knee Pain. No medication changes.  05/15/21 Chriss Driver, LPN. For medical Wellness. No medication changes.  04/11/21 Dr. Dennard Schaumann For open wound of left lower extremity. No medication changes.   Recent consult visits:  None since 04/06/21  Hospital visits:  None since 04/06/21  Medications: Outpatient Encounter Medications as of 05/27/2021  Medication Sig   cephALEXin (KEFLEX) 500 MG capsule Take 1 capsule (500 mg total) by mouth 3 (three) times daily.   clobetasol (TEMOVATE) 0.05 % external solution Apply 1 application topically 2 (two) times daily.   clobetasol cream (TEMOVATE) 3.29 % Apply 1 application topically daily as needed (use as directed for skin).   clopidogrel (PLAVIX) 75 MG tablet TAKE 1 TABLET(75 MG) BY MOUTH DAILY   collagenase (SANTYL) ointment Apply 1 application topically daily. TO YELLOW SLOUGH TO LLE   furosemide (LASIX) 40 MG tablet Take 1 and 1/2 tabs in the AM and 1 tab in the afternoon   HYDROcodone-acetaminophen (NORCO) 10-325 MG tablet Take 1 tablet by mouth every 4 (four) hours as needed (maxium dose 5 tabs PO QD). Chronic Pain. Dx: G89.4.   losartan (COZAAR) 100 MG tablet TAKE 1 TABLET(100 MG) BY MOUTH DAILY   meclizine (ANTIVERT) 25 MG tablet TAKE 1 TABLET(25 MG) BY MOUTH AT BEDTIME   NON FORMULARY Diet:Regular, NAS   NYSTATIN powder APPLY TOPICALLY TO THE AFFECTED AREA EVERY DAY AS NEEDED FOR RASH   pantoprazole (PROTONIX) 40 MG tablet TAKE 1 TABLET(40 MG) BY MOUTH DAILY   potassium chloride SA (KLOR-CON) 20 MEQ tablet TAKE 1 TABLET(20 MEQ) BY MOUTH DAILY   pravastatin (PRAVACHOL) 20 MG tablet TAKE 1 TABLET(20 MG) BY MOUTH DAILY   silver sulfADIAZINE (SILVADENE) 1  % cream Apply 1 application topically 2 (two) times daily. Special Instructions: Clean left lower leg with NS. Apply silvadene. Cover with gauze, wrap with kling, cover with an ace wrap   silver sulfADIAZINE (SILVADENE) 1 % cream Apply 1 application topically daily.   triamcinolone cream (KENALOG) 0.1 % Apply 1 application topically daily as needed (for irritation).   No facility-administered encounter medications on file as of 05/27/2021.   Reviewed chart prior to disease state call. Spoke with patient regarding BP  Recent Office Vitals: BP Readings from Last 3 Encounters:  05/13/21 120/82  04/11/21 120/60  04/04/21 130/68   Pulse Readings from Last 3 Encounters:  05/13/21 68  04/11/21 97  04/04/21 (!) 52    Wt Readings from Last 3 Encounters:  05/13/21 241 lb (109.3 kg)  05/05/21 236 lb (107 kg)  04/04/21 236 lb (107 kg)     Kidney Function Lab Results  Component Value Date/Time   CREATININE 1.01 03/14/2021 12:52 PM   CREATININE 0.98 02/05/2021 06:24 AM   CREATININE 1.08 02/04/2021 06:47 AM   CREATININE 1.17 (H) 12/13/2020 12:32 PM   GFRNONAA >60 02/05/2021 06:24 AM   GFRNONAA 56 (L) 12/13/2020 12:32 PM   GFRAA 65 12/13/2020 12:32 PM    BMP Latest Ref Rng & Units 03/14/2021 02/05/2021 02/04/2021  Glucose 65 - 99 mg/dL 118(H) 130(H) 122(H)  BUN 7 - 25 mg/dL 15 16 14   Creatinine 0.70 - 1.22 mg/dL 1.01 0.98 1.08  BUN/Creat Ratio 6 - 22 (calc) NOT  APPLICABLE - -  Sodium 481 - 146 mmol/L 139 135 137  Potassium 3.5 - 5.3 mmol/L 4.5 3.8 4.2  Chloride 98 - 110 mmol/L 101 95(L) 95(L)  CO2 20 - 32 mmol/L 30 29 33(H)  Calcium 8.6 - 10.3 mg/dL 9.1 8.8(L) 8.8(L)    Current antihypertensive regimen:  Losartan 100 mg 1 tablet by mouth daily.  What recent interventions/DTPs have been made by any provider to improve Blood Pressure control since last CPP Visit: None.  Any recent hospitalizations or ED visits since last visit with CPP? Patients chart does not show any  hospitalizations or ER visits.  Adherence Review: Is the patient currently on ACE/ARB medication? Losartan 100 mg  Does the patient have >5 day gap between last estimated fill dates? Per misc rpts, no.  Care Gaps:Not on the list.  Star Rating Drugs:Losartan 100 mg 05/02/21 90 DS, Pravastatin 20 mg 05/09/21 30 DS.   Third unsuccessful telephone outreach was attempted today. The patient was referred to the pharmacist for assistance with care management and care coordination.   Follow-Up:Pharmacist Review  Charlann Lange, [place] Pharmacist Assistant [phone]

## 2021-05-30 ENCOUNTER — Telehealth: Payer: Self-pay | Admitting: Family Medicine

## 2021-05-30 NOTE — Telephone Encounter (Signed)
Patient's spouse Tonia Ghent called requesting provider complete a form today for insurance company; Due today. Specified areas needing completion are highlighted in yellow. Patient will attach post it note to form with instructions. Please advise at [phone] when form completed.

## 2021-05-30 NOTE — Telephone Encounter (Signed)
Form just dropped off. Placed in hanging folder at [employer]. Spouse Audelia Hives will pick up forms when ready. Please advise at [phone].

## 2021-05-30 NOTE — Telephone Encounter (Signed)
Received home health certification form from patient.   Call placed to patient spouse, Isabel Caprice for more information. Noted [place] discharged on 05/11/2021 as all goals were met.

## 2021-05-30 NOTE — Telephone Encounter (Signed)
Awaiting forms

## 2021-05-31 NOTE — Telephone Encounter (Signed)
Received return call from patient spouse Tonia Ghent.   Reports that [employer] is a long term care insurance and will reimburse her out of pocket costs for home health from the dates of 02/01/2021- 05/11/2021.  Requested that certification be completed.   Routed to provider.

## 2021-06-07 ENCOUNTER — Other Ambulatory Visit: Payer: Self-pay | Admitting: Adult Health

## 2021-06-14 ENCOUNTER — Ambulatory Visit (INDEPENDENT_AMBULATORY_CARE_PROVIDER_SITE_OTHER): Payer: PPO | Admitting: Family Medicine

## 2021-06-14 ENCOUNTER — Other Ambulatory Visit: Payer: Self-pay

## 2021-06-14 VITALS — BP 120/72 | HR 70 | Temp 98.1°F | Wt 233.0 lb

## 2021-06-14 DIAGNOSIS — E118 Type 2 diabetes mellitus with unspecified complications: Secondary | ICD-10-CM

## 2021-06-14 DIAGNOSIS — I4819 Other persistent atrial fibrillation: Secondary | ICD-10-CM

## 2021-06-14 DIAGNOSIS — I10 Essential (primary) hypertension: Secondary | ICD-10-CM | POA: Diagnosis not present

## 2021-06-14 DIAGNOSIS — I5020 Unspecified systolic (congestive) heart failure: Secondary | ICD-10-CM

## 2021-06-14 DIAGNOSIS — Z23 Encounter for immunization: Secondary | ICD-10-CM | POA: Diagnosis not present

## 2021-06-14 MED ORDER — HYDROCODONE-ACETAMINOPHEN 10-325 MG PO TABS: 1.0000 | ORAL_TABLET | ORAL | 0 refills | Status: DC | PRN

## 2021-06-14 NOTE — Progress Notes (Signed)
Subjective:    Patient ID: Bernard Ramirez, male    DOB: [birth year], 87 y.o.   MRN: [id number]  Patient is an 87 year old Caucasian male here today for a checkup.  Past medical history significant for permanent atrial fibrillation.  He also has congestive heart failure with an ejection fraction of 50% on an echocardiogram in 2019.  Thankfully, he is compliant with compression hose therapy.  His son is coming over every morning and helping him apply his compression stockings.  This is helping keep his swelling in his legs manageable.  Today there is only trace edema in both legs underneath his compression hose.  He denies any orthopnea or paroxysmal nocturnal dyspnea.  He denies any chest pain or syncope or lightheadedness.  Blood pressure today is excellent at 120/72.  He is due to receive a flu shot today.  He is also requesting a refill on his chronic pain medication that he takes for bilateral knee arthritis as well as low back pain. Past Medical History:  Diagnosis Date   Anemia    04/2012: H&H-10.2/31.5, MCV-77; 06/2012:12/38.9   Arthritis    Benign prostatic hypertrophy    s/p transurethral resection of the prostate   Chest pain    2004; with dyspnea, stress nuclear-normal EF + questionable inferior wall ischemia, normal coronary angiography;  2010-negative stress nuclear   CVA (cerebral infarction)    asymptomatic (seen on CT)   Diarrhea    h/o Hemoccult-positive stool   Dysrhythmia    Edema of both legs    GERD (gastroesophageal reflux disease)    History of blood transfusion    History of kidney stones    History of urinary tract infection    Hypertension    Jerking movements of extremities    left arm    Low back pain    Obstructive sleep apnea    pt states can not use the CPAP   Peripheral neuropathy    lower legs and feet bilat    Persistent atrial fibrillation ([place]) 2013   Asymptomatic; diagnosed in 05/2012   Pneumonia    Prostate cancer ([place]) 2017   stage 4    Shortness of breath dyspnea    pt states only develops SOB if tries to do something too quickly   Upper GI bleed 1985   1985-peptic ulcer disease; initial hemigastrectomy; subsequent Billroth II   Urinary hesitancy    Vertigo    ED evaluation-06/2012   Past Surgical History:  Procedure Laterality Date   Billroth II     CHOLECYSTECTOMY     COLONOSCOPY  Approximately 2000   COLONOSCOPY  10/2012   Colonoscopy March 2014 at [facility], difficulty exam requiring fluoroscopy and overtube. Patient developed severe bradycardia again during his procedure like he did [facility]. Multiple polyps removed which were tubular adenomas. Previously tattooed sites at 20 and 30 cm were free of any polypoid change. Left-sided diverticulosis noted.   COLONOSCOPY WITH ESOPHAGOGASTRODUODENOSCOPY (EGD)  06-17-2009   EEF:EOFHQRFX tubular adenomas and 2 tubulovillous adenomas, incomplete, 3 clips placed   ESOPHAGOGASTRODUODENOSCOPY  06/17/2009   SLF: normal/chronic gastritis (non-h pylori)   ESOPHAGOGASTRODUODENOSCOPY N/A 05/11/2015   JOI:TGPQDIY anemai due to postgastrectomy state/moderate erosive gastritis   INGUINAL HERNIA REPAIR     Left   ROTATOR CUFF REPAIR  1991   Bilateral   TRANSURETHRAL RESECTION OF PROSTATE  06/2016   TRANSURETHRAL RESECTION OF PROSTATE N/A 12/27/2012   Procedure: TRANSURETHRAL RESECTION OF THE PROSTATE (TURP);  Surgeon: Marissa Nestle,  MD;  Location: [facility];  Service: Urology;  Laterality: N/A;   TRANSURETHRAL RESECTION OF PROSTATE N/A 04/11/2016   Procedure: TRANSURETHRAL RESECTION OF THE PROSTATE (TURP);  Surgeon: Irine Seal, MD;  Location: [facility];  Service: Urology;  Laterality: N/A;   VAGOTOMY AND PYLOROPLASTY  1970s   Details of procedure uncertain; 8832P   Current Outpatient Medications on File Prior to Visit  Medication Sig Dispense Refill   cephALEXin (KEFLEX) 500 MG capsule Take 1 capsule (500 mg total) by mouth 3 (three) times daily. 21 capsule 0   clobetasol  (TEMOVATE) 0.05 % external solution Apply 1 application topically 2 (two) times daily. 50 mL 2   clobetasol cream (TEMOVATE) 4.98 % Apply 1 application topically daily as needed (use as directed for skin). 30 g 0   clopidogrel (PLAVIX) 75 MG tablet TAKE 1 TABLET(75 MG) BY MOUTH DAILY 30 tablet 0   collagenase (SANTYL) ointment Apply 1 application topically daily. TO YELLOW SLOUGH TO LLE 15 g 0   furosemide (LASIX) 40 MG tablet Take 1 and 1/2 tabs in the AM and 1 tab in the afternoon 90 tablet 0   losartan (COZAAR) 100 MG tablet TAKE 1 TABLET(100 MG) BY MOUTH DAILY 90 tablet 0   meclizine (ANTIVERT) 25 MG tablet TAKE 1 TABLET(25 MG) BY MOUTH AT BEDTIME 30 tablet 3   NON FORMULARY Diet:Regular, NAS     NYSTATIN powder APPLY TOPICALLY TO THE AFFECTED AREA EVERY DAY AS NEEDED FOR RASH 60 g 3   pantoprazole (PROTONIX) 40 MG tablet TAKE 1 TABLET(40 MG) BY MOUTH DAILY 30 tablet 0   potassium chloride SA (KLOR-CON) 20 MEQ tablet TAKE 1 TABLET(20 MEQ) BY MOUTH DAILY 30 tablet 0   pravastatin (PRAVACHOL) 20 MG tablet TAKE 1 TABLET(20 MG) BY MOUTH DAILY 30 tablet 0   silver sulfADIAZINE (SILVADENE) 1 % cream Apply 1 application topically 2 (two) times daily. Special Instructions: Clean left lower leg with NS. Apply silvadene. Cover with gauze, wrap with kling, cover with an ace wrap 50 g 0   silver sulfADIAZINE (SILVADENE) 1 % cream Apply 1 application topically daily. 400 g 0   triamcinolone cream (KENALOG) 0.1 % Apply 1 application topically daily as needed (for irritation). 30 g 0   No current facility-administered medications on file prior to visit.   Allergies  Allergen Reactions   Contrast Media [Iodinated Diagnostic Agents]     Pt states he was given "dye" 20 yrs ago, anaphylaxis & syncope - was told to never have it again    Gabapentin Itching   Aspirin Other (See Comments)    Blistering    Latex Rash   Lipitor [Atorvastatin] Itching   Tape Rash    EKG leads   Social History    Socioeconomic History   Marital status: Married    Spouse name: [name]   Number of children: 5   Years of education: Not on file   Highest education level: Not on file  Occupational History   Occupation: Retired Pensions consultant: RETIRED  Tobacco Use   Smoking status: Former    Types: Cigars    Quit date: 08/22/2003    Years since quitting: 17.8   Smokeless tobacco: Never   Tobacco comments:    Quit many years ago; few cigars per day; didn't inhale  Vaping Use   Vaping Use: Never used  Substance and Sexual Activity   Alcohol use: No    Alcohol/week: 1.0 standard drink  Types: 1 Standard drinks or equivalent per week   Drug use: No   Sexual activity: Not on file  Other Topics Concern   Not on file  Social History Narrative   Lives w/ wife in [place]   Retired truck Geophysicist/field seismologist   Social Determinants of Radio broadcast assistant Strain: Low Risk    Difficulty of Paying Living Expenses: Not hard at all  Food Insecurity: No Food Insecurity   Worried About Charity fundraiser in the Last Year: Never true   Arboriculturist in the Last Year: Never true  Transportation Needs: No Transportation Needs   Lack of Transportation (Medical): No   Lack of Transportation (Non-Medical): No  Physical Activity: Inactive   Days of Exercise per Week: 0 days   Minutes of Exercise per Session: 0 min  Stress: No Stress Concern Present   Feeling of Stress : Not at all  Social Connections: Socially Isolated   Frequency of Communication with Friends and Family: Never   Frequency of Social Gatherings with Friends and Family: Once a week   Attends Religious Services: Never   Marine scientist or Organizations: No   Attends Music therapist: Never   Marital Status: Married  Human resources officer Violence: Not At Risk   Fear of Current or Ex-Partner: No   Emotionally Abused: No   Physically Abused: No   Sexually Abused: No      Review of Systems  All other  systems reviewed and are negative.     Objective:   Physical Exam Vitals reviewed.  Constitutional:      General: He is not in acute distress.    Appearance: Normal appearance. He is obese. He is not ill-appearing or toxic-appearing.  Cardiovascular:     Rate and Rhythm: Normal rate. Rhythm irregular.     Heart sounds: Normal heart sounds. No murmur heard.   No friction rub. No gallop.  Pulmonary:     Effort: Pulmonary effort is normal. No respiratory distress.     Breath sounds: Normal breath sounds. No stridor. No wheezing, rhonchi or rales.  Abdominal:     General: Abdomen is flat. Bowel sounds are normal. There is no distension.     Palpations: Abdomen is soft. There is no mass.     Tenderness: There is no abdominal tenderness. There is no guarding.  Musculoskeletal:     Right lower leg: No edema.     Left lower leg: No edema.  Skin:    Findings: No erythema.  Neurological:     General: No focal deficit present.     Mental Status: He is alert and oriented to person, place, and time.     Cranial Nerves: No cranial nerve deficit.     Sensory: No sensory deficit.     Motor: No weakness.     Coordination: Coordination normal.     Gait: Gait abnormal (Shuffling gait.  Requires a cane to ambulate).     Deep Tendon Reflexes: Reflexes normal.  Psychiatric:        Mood and Affect: Mood normal.        Thought Content: Thought content normal.        Judgment: Judgment normal.          Assessment & Plan:  Controlled type 2 diabetes mellitus with complication, without long-term current use of insulin ([place]) - Plan: Hemoglobin A1c, CBC with Differential/Platelet, COMPLETE METABOLIC PANEL WITH GFR  Need for immunization against influenza -  Plan: Flu Vaccine QUAD High Dose(Fluad)  Systolic congestive heart failure, unspecified HF chronicity (HCC)  Essential hypertension  Persistent atrial fibrillation (HCC) Recheck CBC CMP as well as a hemoglobin A1c.  Goal A1c is less than  6.5.  The patient appears euvolemic today.  There is no evidence of fluid overload.  I congratulated him on his compliance with compression hose therapy.  The wound on his anterior left lower shin has finally closed through secondary intention.  I believe that compliance with compression hose will help prevent this from reoccurring.  The patient received his flu shot today.  Blood pressure today is outstanding.  Heart rate is irregular but he is rate controlled.  He is unable to take chronic anticoagulation due to a history of GI bleeding.  Therefore they have elected to continue him on Plavix due to history of stroke

## 2021-06-15 LAB — COMPLETE METABOLIC PANEL WITH GFR
AG Ratio: 1.7 (calc) (ref 1.0–2.5)
ALT: 6 U/L — ABNORMAL LOW (ref 9–46)
AST: 13 U/L (ref 10–35)
Albumin: 3.9 g/dL (ref 3.6–5.1)
Alkaline phosphatase (APISO): 76 U/L (ref 35–144)
BUN: 15 mg/dL (ref 7–25)
CO2: 29 mmol/L (ref 20–32)
Calcium: 9.1 mg/dL (ref 8.6–10.3)
Chloride: 101 mmol/L (ref 98–110)
Creat: 1.09 mg/dL (ref 0.70–1.22)
Globulin: 2.3 g/dL (calc) (ref 1.9–3.7)
Glucose, Bld: 131 mg/dL — ABNORMAL HIGH (ref 65–99)
Potassium: 4.5 mmol/L (ref 3.5–5.3)
Sodium: 138 mmol/L (ref 135–146)
Total Bilirubin: 1.1 mg/dL (ref 0.2–1.2)
Total Protein: 6.2 g/dL (ref 6.1–8.1)
eGFR: 65 mL/min/{1.73_m2} (ref >=60)

## 2021-06-15 LAB — CBC WITH DIFFERENTIAL/PLATELET
Absolute Monocytes: 600 cells/uL (ref 200–950)
Basophils Absolute: 12 cells/uL (ref 0–200)
Basophils Relative: 0.2 %
Eosinophils Absolute: 132 cells/uL (ref 15–500)
Eosinophils Relative: 2.2 %
HCT: 36.1 % — ABNORMAL LOW (ref 38.5–50.0)
Hemoglobin: 11.3 g/dL — ABNORMAL LOW (ref 13.2–17.1)
Lymphs Abs: 678 cells/uL — ABNORMAL LOW (ref 850–3900)
MCH: 26.7 pg — ABNORMAL LOW (ref 27.0–33.0)
MCHC: 31.3 g/dL — ABNORMAL LOW (ref 32.0–36.0)
MCV: 85.3 fL (ref 80.0–100.0)
MPV: 11.3 fL (ref 7.5–12.5)
Monocytes Relative: 10 %
Neutro Abs: 4578 cells/uL (ref 1500–7800)
Neutrophils Relative %: 76.3 %
Platelets: 213 10*3/uL (ref 140–400)
RBC: 4.23 10*6/uL (ref 4.20–5.80)
RDW: 15 % (ref 11.0–15.0)
Total Lymphocyte: 11.3 %
WBC: 6 10*3/uL (ref 3.8–10.8)

## 2021-06-15 LAB — HEMOGLOBIN A1C
Hgb A1c MFr Bld: 6.5 % of total Hgb — ABNORMAL HIGH (ref <5.7)
Mean Plasma Glucose: 140 mg/dL
eAG (mmol/L): 7.7 mmol/L

## 2021-06-16 ENCOUNTER — Encounter: Payer: Self-pay | Admitting: *Deleted

## 2021-06-16 ENCOUNTER — Other Ambulatory Visit: Payer: Self-pay

## 2021-06-16 ENCOUNTER — Emergency Department (HOSPITAL_COMMUNITY): Payer: PPO

## 2021-06-16 ENCOUNTER — Emergency Department (HOSPITAL_COMMUNITY)
Admission: EM | Admit: 2021-06-16 | Discharge: 2021-06-16 | Disposition: A | Discharge status: Home / Self Care | Payer: PPO | Attending: Emergency Medicine | Admitting: Emergency Medicine

## 2021-06-16 ENCOUNTER — Encounter (HOSPITAL_COMMUNITY): Payer: Self-pay | Admitting: *Deleted

## 2021-06-16 DIAGNOSIS — Z91040 Latex allergy status: Secondary | ICD-10-CM | POA: Insufficient documentation

## 2021-06-16 DIAGNOSIS — R0789 Other chest pain: Secondary | ICD-10-CM | POA: Diagnosis not present

## 2021-06-16 DIAGNOSIS — S0990XA Unspecified injury of head, initial encounter: Secondary | ICD-10-CM | POA: Insufficient documentation

## 2021-06-16 DIAGNOSIS — N182 Chronic kidney disease, stage 2 (mild): Secondary | ICD-10-CM | POA: Diagnosis not present

## 2021-06-16 DIAGNOSIS — S59901A Unspecified injury of right elbow, initial encounter: Secondary | ICD-10-CM | POA: Diagnosis present

## 2021-06-16 DIAGNOSIS — I130 Hypertensive heart and chronic kidney disease with heart failure and stage 1 through stage 4 chronic kidney disease, or unspecified chronic kidney disease: Secondary | ICD-10-CM | POA: Diagnosis not present

## 2021-06-16 DIAGNOSIS — W07XXXA Fall from chair, initial encounter: Secondary | ICD-10-CM | POA: Diagnosis not present

## 2021-06-16 DIAGNOSIS — S52514A Nondisplaced fracture of right radial styloid process, initial encounter for closed fracture: Secondary | ICD-10-CM | POA: Insufficient documentation

## 2021-06-16 DIAGNOSIS — Z87891 Personal history of nicotine dependence: Secondary | ICD-10-CM | POA: Diagnosis not present

## 2021-06-16 DIAGNOSIS — Z7901 Long term (current) use of anticoagulants: Secondary | ICD-10-CM | POA: Insufficient documentation

## 2021-06-16 DIAGNOSIS — I4891 Unspecified atrial fibrillation: Secondary | ICD-10-CM | POA: Diagnosis not present

## 2021-06-16 DIAGNOSIS — Z8546 Personal history of malignant neoplasm of prostate: Secondary | ICD-10-CM | POA: Diagnosis not present

## 2021-06-16 DIAGNOSIS — R0781 Pleurodynia: Secondary | ICD-10-CM | POA: Diagnosis not present

## 2021-06-16 DIAGNOSIS — W19XXXA Unspecified fall, initial encounter: Secondary | ICD-10-CM

## 2021-06-16 DIAGNOSIS — I509 Heart failure, unspecified: Secondary | ICD-10-CM | POA: Diagnosis not present

## 2021-06-16 DIAGNOSIS — M542 Cervicalgia: Secondary | ICD-10-CM | POA: Diagnosis not present

## 2021-06-16 DIAGNOSIS — Z79899 Other long term (current) drug therapy: Secondary | ICD-10-CM | POA: Insufficient documentation

## 2021-06-16 DIAGNOSIS — M79641 Pain in right hand: Secondary | ICD-10-CM | POA: Diagnosis not present

## 2021-06-16 DIAGNOSIS — Z043 Encounter for examination and observation following other accident: Secondary | ICD-10-CM | POA: Diagnosis not present

## 2021-06-16 DIAGNOSIS — M7989 Other specified soft tissue disorders: Secondary | ICD-10-CM | POA: Diagnosis not present

## 2021-06-16 DIAGNOSIS — S52531A Colles' fracture of right radius, initial encounter for closed fracture: Secondary | ICD-10-CM | POA: Diagnosis not present

## 2021-06-16 MED ORDER — BACITRACIN ZINC 500 UNIT/GM EX OINT
TOPICAL_OINTMENT | Freq: Once | CUTANEOUS | Status: AC
  Administered 2021-06-16: 1 via TOPICAL
  Filled 2021-06-16: qty 0.9

## 2021-06-16 NOTE — ED Provider Notes (Signed)
[facility] EMERGENCY DEPARTMENT Provider Note   CSN: [id number] Arrival date & time: 06/16/21  1320     History Chief Complaint  Patient presents with   Lytle Michaels    Bernard Ramirez is a 87 y.o. male.  HPI  Patient with significant medical history of A. fib currently not on Eliquis due to GI bleeds, diastolic dysfunction, CVA, GERD, hypertension, stage IV prostate cancer, presents with chief complaint of a fall.  Patient states today he was trying to sit down in a chair unfortunate missed the chair and fell onto his front side.  He states he landed mostly onto his right side, he does not think he lost conscious, he is not on anticoagulants, he states he is unable to get up.  Patient states he has pain in his neck because he is wearing a c-collar, right-sided rib pain with right wrist and hand pain.  He denies  headaches, face pain, back pain,  pain in his lower extremities.  Patient has no other complaints at this time.  He denies any leaving or aggravating factors.  Past Medical History:  Diagnosis Date   Anemia    04/2012: H&H-10.2/31.5, MCV-77; 06/2012:12/38.9   Arthritis    Benign prostatic hypertrophy    s/p transurethral resection of the prostate   Chest pain    2004; with dyspnea, stress nuclear-normal EF + questionable inferior wall ischemia, normal coronary angiography;  2010-negative stress nuclear   CVA (cerebral infarction)    asymptomatic (seen on CT)   Diarrhea    h/o Hemoccult-positive stool   Dysrhythmia    Edema of both legs    GERD (gastroesophageal reflux disease)    History of blood transfusion    History of kidney stones    History of urinary tract infection    Hypertension    Jerking movements of extremities    left arm    Low back pain    Obstructive sleep apnea    pt states can not use the CPAP   Peripheral neuropathy    lower legs and feet bilat    Persistent atrial fibrillation ([place]) 2013   Asymptomatic; diagnosed in 05/2012   Pneumonia     Prostate cancer ([place]) 2017   stage 4   Shortness of breath dyspnea    pt states only develops SOB if tries to do something too quickly   Upper GI bleed 1985   1985-peptic ulcer disease; initial hemigastrectomy; subsequent Billroth II   Urinary hesitancy    Vertigo    ED evaluation-06/2012    Patient Active Problem List   Diagnosis Date Noted   GERD without esophagitis 02/18/2021   Hypokalemia 02/18/2021   Hyperlipidemia LDL goal <100 02/18/2021   Hypertensive heart disease with congestive heart failure ([place]) 02/18/2021   Snoring 02/08/2021   Failure to thrive in adult    Cellulitis 02/01/2021   Social problem 02/01/2021   Protein calorie malnutrition ([place]) 02/01/2021   Anxiety 02/01/2021   Congestive heart failure (CHF) ([place]) 10/08/2016   Acute renal insufficiency due to procedure 04/13/2016   BPH (benign prostatic hypertrophy) with urinary obstruction 04/11/2016   Shortness of breath 09/27/2015   Absolute anemia    ED (erectile dysfunction) of organic origin 11/06/2014   Benign non-nodular prostatic hyperplasia with lower urinary tract symptoms 11/06/2014   Increased frequency of urination 10/12/2014   Post-traumatic bulbous urethral stricture 10/12/2014   Atrial fibrillation by electrocardiogram ([place]) 11/08/2012   Former smoker 11/08/2012   Chronic kidney disease, stage II (  mild) 07/11/2012   Vertigo    Atrial fibrillation ([place])    Anemia 06/10/2009   Essential hypertension 06/10/2009   Back pain 06/10/2009   DIARRHEA, CHRONIC 06/10/2009   DIVERTICULITIS, HX OF 06/10/2009    Past Surgical History:  Procedure Laterality Date   Billroth II     CHOLECYSTECTOMY     COLONOSCOPY  Approximately 2000   COLONOSCOPY  10/2012   Colonoscopy March 2014 at [facility], difficulty exam requiring fluoroscopy and overtube. Patient developed severe bradycardia again during his procedure like he did [facility]. Multiple polyps removed which were tubular adenomas. Previously tattooed  sites at 20 and 30 cm were free of any polypoid change. Left-sided diverticulosis noted.   COLONOSCOPY WITH ESOPHAGOGASTRODUODENOSCOPY (EGD)  06-17-2009   GYK:ZLDJTTSV tubular adenomas and 2 tubulovillous adenomas, incomplete, 3 clips placed   ESOPHAGOGASTRODUODENOSCOPY  06/17/2009   SLF: normal/chronic gastritis (non-h pylori)   ESOPHAGOGASTRODUODENOSCOPY N/A 05/11/2015   XBL:TJQZESP anemai due to postgastrectomy state/moderate erosive gastritis   INGUINAL HERNIA REPAIR     Left   ROTATOR CUFF REPAIR  1991   Bilateral   TRANSURETHRAL RESECTION OF PROSTATE  06/2016   TRANSURETHRAL RESECTION OF PROSTATE N/A 12/27/2012   Procedure: TRANSURETHRAL RESECTION OF THE PROSTATE (TURP);  Surgeon: Marissa Nestle, MD;  Location: [facility];  Service: Urology;  Laterality: N/A;   TRANSURETHRAL RESECTION OF PROSTATE N/A 04/11/2016   Procedure: TRANSURETHRAL RESECTION OF THE PROSTATE (TURP);  Surgeon: Irine Seal, MD;  Location: [facility];  Service: Urology;  Laterality: N/A;   VAGOTOMY AND PYLOROPLASTY  1970s   Details of procedure uncertain; 2330Q       Family History  Problem Relation Age of Onset   Diabetes Mellitus II Mother    Allergic rhinitis Mother    Lung disease Father    Allergic rhinitis Father    Angioedema Neg Hx    Asthma Neg Hx    Atopy Neg Hx    Eczema Neg Hx    Immunodeficiency Neg Hx    Urticaria Neg Hx    Cystic fibrosis Neg Hx    Lupus Neg Hx    Emphysema Neg Hx    Migraines Neg Hx     Social History   Tobacco Use   Smoking status: Former    Types: Cigars    Quit date: 08/22/2003    Years since quitting: 17.8   Smokeless tobacco: Never   Tobacco comments:    Quit many years ago; few cigars per day; didn't inhale  Vaping Use   Vaping Use: Never used  Substance Use Topics   Alcohol use: No    Alcohol/week: 1.0 standard drink    Types: 1 Standard drinks or equivalent per week   Drug use: No    Home Medications Prior to Admission medications   Medication Sig Start  Date End Date Taking? Authorizing Provider  cephALEXin (KEFLEX) 500 MG capsule Take 1 capsule (500 mg total) by mouth 3 (three) times daily. 03/14/21   [name], MD  clobetasol (TEMOVATE) 0.05 % external solution Apply 1 application topically 2 (two) times daily. 03/14/21   [name], MD  clobetasol cream (TEMOVATE) 7.62 % Apply 1 application topically daily as needed (use as directed for skin). 02/17/21   Gerlene Fee, NP  clopidogrel (PLAVIX) 75 MG tablet TAKE 1 TABLET(75 MG) BY MOUTH DAILY 02/17/21   Gerlene Fee, NP  collagenase (SANTYL) ointment Apply 1 application topically daily. TO YELLOW SLOUGH TO LLE 03/28/21  [name], MD  furosemide (LASIX) 40 MG tablet Take 1 and 1/2 tabs in the AM and 1 tab in the afternoon 02/17/21   Gerlene Fee, NP  HYDROcodone-acetaminophen (NORCO) 10-325 MG tablet Take 1 tablet by mouth every 4 (four) hours as needed (maxium dose 5 tabs PO QD). Chronic Pain. Dx: G89.4. 06/14/21   [name], MD  losartan (COZAAR) 100 MG tablet TAKE 1 TABLET(100 MG) BY MOUTH DAILY 05/02/21   [name], MD  meclizine (ANTIVERT) 25 MG tablet TAKE 1 TABLET(25 MG) BY MOUTH AT BEDTIME 04/04/21   [name], MD  NON FORMULARY Diet:Regular, NAS    [provider]  NYSTATIN powder APPLY TOPICALLY TO THE AFFECTED AREA EVERY DAY AS NEEDED FOR RASH 04/29/21   [name], MD  pantoprazole (PROTONIX) 40 MG tablet TAKE 1 TABLET(40 MG) BY MOUTH DAILY 02/17/21   Gerlene Fee, NP  potassium chloride SA (KLOR-CON) 20 MEQ tablet TAKE 1 TABLET(20 MEQ) BY MOUTH DAILY 02/17/21   Gerlene Fee, NP  pravastatin (PRAVACHOL) 20 MG tablet TAKE 1 TABLET(20 MG) BY MOUTH DAILY 02/17/21   Gerlene Fee, NP  silver sulfADIAZINE (SILVADENE) 1 % cream Apply 1 application topically 2 (two) times daily. Special Instructions: Clean left lower leg with NS. Apply silvadene. Cover with gauze, wrap with kling, cover with an ace wrap 02/17/21   Gerlene Fee, NP  silver sulfADIAZINE (SILVADENE) 1 % cream Apply 1 application topically daily. 02/28/21   [name], MD  triamcinolone cream (KENALOG) 0.1 % Apply 1 application topically daily as needed (for irritation). 02/17/21   Gerlene Fee, NP    Allergies    Contrast media [iodinated diagnostic agents], Gabapentin, Aspirin, Latex, Lipitor [atorvastatin], and Tape  Review of Systems   Review of Systems  Constitutional:  Negative for chills and fever.  HENT:  Negative for congestion.   Respiratory:  Negative for shortness of breath.   Cardiovascular:  Negative for chest pain.  Gastrointestinal:  Negative for abdominal pain.  Genitourinary:  Negative for enuresis.  Musculoskeletal:  Negative for back pain.       Neck pain, rib pain, right wrist and hand pain.  Skin:  Positive for wound. Negative for rash.       Skin tears on right arm.  Neurological:  Negative for dizziness and headaches.  Hematological:  Does not bruise/bleed easily.   Physical Exam Updated Vital Signs BP 135/80 (BP Location: Left Arm)   Pulse 70   Temp 98.7 F (37.1 C) (Oral)   Resp 18   Ht 6' (1.829 m)   Wt 105.7 kg   SpO2 96%   BMI 31.60 kg/m   Physical Exam Vitals and nursing note reviewed.  Constitutional:      General: He is not in acute distress.    Appearance: He is not ill-appearing.  HENT:     Head: Normocephalic and atraumatic.     Comments: Patient is a small superficial abrasion on the lateral aspect of his right eyebrow, hemodynamically stable.  There is no other gross deformities present on patient's face, no raccoon eyes or battle sign present.    Nose: No congestion.     Mouth/Throat:     Mouth: Mucous membranes are moist.     Pharynx: Oropharynx is clear.     Comments: No trismus  present, no dental trauma present, controlling oral secretions. Eyes:     Conjunctiva/sclera: Conjunctivae normal.  Neck:  Comments: C-collar in place unable to fully examine neck does  not appear tender on my exam. Cardiovascular:     Rate and Rhythm: Normal rate and regular rhythm.     Pulses: Normal pulses.     Heart sounds: No murmur heard.   No friction rub. No gallop.  Pulmonary:     Effort: No respiratory distress.     Breath sounds: No wheezing, rhonchi or rales.     Comments: Chest was palpated he has slight tenderness palpation on the right anterior aspect of the fourth and fifth rib, no crepitus or deformities present. Chest:     Chest wall: Tenderness present.  Abdominal:     Palpations: Abdomen is soft.     Tenderness: There is no abdominal tenderness. There is no right CVA tenderness or left CVA tenderness.  Musculoskeletal:     Comments: Patient is moving all 4 extremities without difficulty.  No leg shortening, no internal or external rotation of the lower extremities, no pelvic instability, extremities are nontender to palpation.  Skin:    General: Skin is warm and dry.     Comments: Patient has noted skin tears on his right upper extremity, as noted on his anterior aspect of his hand, and around his elbow.  All which were hemodynamically stable.  Neurological:     Mental Status: He is alert.     Comments: No facial asymmetry, no difficult word finding, able to follow two-step commands, no unilateral weakness present.  Psychiatric:        Mood and Affect: Mood normal.    ED Results / Procedures / Treatments   Labs (all labs ordered are listed, but only abnormal results are displayed) Labs Reviewed - No data to display  EKG None  Radiology DG Ribs Unilateral W/Chest Right  Result Date: 06/16/2021 CLINICAL DATA:  Tenderness of anterior right fourth rib EXAM: RIGHT RIBS AND CHEST - 3+ VIEW COMPARISON:  Chest radiograph 01/24/2021 FINDINGS: The cardiomediastinal silhouette is stable. There is unchanged asymmetric elevation of the right hemidiaphragm with adjacent linear opacities likely reflecting atelectasis. There is no new or worsening airspace  disease. There is no pleural effusion or pneumothorax. No acute rib fracture is identified. Abdominal surgical clips are noted. IMPRESSION: No rib fracture identified Electronically Signed   By: Valetta Mole M.D.   On: 06/16/2021 15:48   DG Wrist Complete Right  Result Date: 06/16/2021 CLINICAL DATA:  Tenderness on the distal aspect of the radius. EXAM: RIGHT WRIST - COMPLETE 3+ VIEW COMPARISON:  None. FINDINGS: There is mild irregularity of the tip of the radial styloid. Severe base of thumb osteoarthritis. Radiocarpal and TFCC chondrocalcinosis. Dorsal hand soft tissue swelling. IMPRESSION: Mild irregularity of the tip of the radial styloid, favored to be related to radiocarpal arthritis, however a nondisplaced fracture is possible if there is point tenderness. Dorsal hand soft tissue swelling. Severe base of thumb osteoarthritis. Electronically Signed   By: Maurine Simmering M.D.   On: 06/16/2021 15:55   CT Head Wo Contrast  Result Date: 06/16/2021 CLINICAL DATA:  Fall from chair, head injury EXAM: CT HEAD WITHOUT CONTRAST CT MAXILLOFACIAL WITHOUT CONTRAST CT CERVICAL SPINE WITHOUT CONTRAST TECHNIQUE: Multidetector CT imaging of the head, cervical spine, and maxillofacial structures were performed using the standard protocol without intravenous contrast. Multiplanar CT image reconstructions of the cervical spine and maxillofacial structures were also generated. COMPARISON:  CT brain, 06/24/2012 FINDINGS: CT HEAD FINDINGS Brain: No evidence of acute infarction, hemorrhage, hydrocephalus, extra-axial collection or mass  lesion/mass effect. Periventricular and deep white matter hypodensity. Vascular: No hyperdense vessel or unexpected calcification. CT FACIAL BONES FINDINGS Skull: Normal. Negative for fracture or focal lesion. Facial bones: No displaced fractures or dislocations. Sinuses/Orbits: No acute finding. Other: None. CT CERVICAL SPINE FINDINGS Alignment: Normal. Skull base and vertebrae: No acute  fracture. No primary bone lesion or focal pathologic process. Soft tissues and spinal canal: No prevertebral fluid or swelling. No visible canal hematoma. Disc levels: Mild-to-moderate multilevel disc space height loss and osteophytosis Upper chest: Negative. Other: None. IMPRESSION: 1. No acute intracranial pathology. Small-vessel white matter disease. 2. No displaced fracture or dislocation of the facial bones. 3. No fracture or static subluxation of the cervical spine. Electronically Signed   By: Delanna Ahmadi M.D.   On: 06/16/2021 15:14   CT Cervical Spine Wo Contrast  Result Date: 06/16/2021 CLINICAL DATA:  Fall from chair, head injury EXAM: CT HEAD WITHOUT CONTRAST CT MAXILLOFACIAL WITHOUT CONTRAST CT CERVICAL SPINE WITHOUT CONTRAST TECHNIQUE: Multidetector CT imaging of the head, cervical spine, and maxillofacial structures were performed using the standard protocol without intravenous contrast. Multiplanar CT image reconstructions of the cervical spine and maxillofacial structures were also generated. COMPARISON:  CT brain, 06/24/2012 FINDINGS: CT HEAD FINDINGS Brain: No evidence of acute infarction, hemorrhage, hydrocephalus, extra-axial collection or mass lesion/mass effect. Periventricular and deep white matter hypodensity. Vascular: No hyperdense vessel or unexpected calcification. CT FACIAL BONES FINDINGS Skull: Normal. Negative for fracture or focal lesion. Facial bones: No displaced fractures or dislocations. Sinuses/Orbits: No acute finding. Other: None. CT CERVICAL SPINE FINDINGS Alignment: Normal. Skull base and vertebrae: No acute fracture. No primary bone lesion or focal pathologic process. Soft tissues and spinal canal: No prevertebral fluid or swelling. No visible canal hematoma. Disc levels: Mild-to-moderate multilevel disc space height loss and osteophytosis Upper chest: Negative. Other: None. IMPRESSION: 1. No acute intracranial pathology. Small-vessel white matter disease. 2. No  displaced fracture or dislocation of the facial bones. 3. No fracture or static subluxation of the cervical spine. Electronically Signed   By: Delanna Ahmadi M.D.   On: 06/16/2021 15:14   DG Hand Complete Right  Result Date: 06/16/2021 CLINICAL DATA:  Tenderness anterior R fourth rib., Tenderness on the third and fourth metacarpals, tenderness on the distal aspect of the radius. EXAM: RIGHT HAND - COMPLETE 3+ VIEW COMPARISON:  None. FINDINGS: There is mild irregularity of the tip of the radial styloid. There is no other evidence of acute fracture. There is severe base of thumb osteoarthritis. Diffuse interphalangeal joint degenerative change. TFCC and radiocarpal chondrocalcinosis. Dorsal hand soft tissue swelling. IMPRESSION: Mild irregularity of the tip of the radial styloid, favored to be related to radiocarpal arthritis, but nondisplaced fracture is possible if there is point tenderness. Dorsal hand soft tissue swelling. Severe base of thumb osteoarthritis. Electronically Signed   By: Maurine Simmering M.D.   On: 06/16/2021 15:53   CT Maxillofacial WO CM  Result Date: 06/16/2021 CLINICAL DATA:  Fall from chair, head injury EXAM: CT HEAD WITHOUT CONTRAST CT MAXILLOFACIAL WITHOUT CONTRAST CT CERVICAL SPINE WITHOUT CONTRAST TECHNIQUE: Multidetector CT imaging of the head, cervical spine, and maxillofacial structures were performed using the standard protocol without intravenous contrast. Multiplanar CT image reconstructions of the cervical spine and maxillofacial structures were also generated. COMPARISON:  CT brain, 06/24/2012 FINDINGS: CT HEAD FINDINGS Brain: No evidence of acute infarction, hemorrhage, hydrocephalus, extra-axial collection or mass lesion/mass effect. Periventricular and deep white matter hypodensity. Vascular: No hyperdense vessel or unexpected calcification. CT FACIAL BONES  FINDINGS Skull: Normal. Negative for fracture or focal lesion. Facial bones: No displaced fractures or dislocations.  Sinuses/Orbits: No acute finding. Other: None. CT CERVICAL SPINE FINDINGS Alignment: Normal. Skull base and vertebrae: No acute fracture. No primary bone lesion or focal pathologic process. Soft tissues and spinal canal: No prevertebral fluid or swelling. No visible canal hematoma. Disc levels: Mild-to-moderate multilevel disc space height loss and osteophytosis Upper chest: Negative. Other: None. IMPRESSION: 1. No acute intracranial pathology. Small-vessel white matter disease. 2. No displaced fracture or dislocation of the facial bones. 3. No fracture or static subluxation of the cervical spine. Electronically Signed   By: Delanna Ahmadi M.D.   On: 06/16/2021 15:14    Procedures Procedures   Medications Ordered in ED Medications  bacitracin ointment (has no administration in time range)    ED Course  I have reviewed the triage vital signs and the nursing notes.  Pertinent labs & imaging results that were available during my care of the patient were reviewed by me and considered in my medical decision making (see chart for details).    MDM Rules/Calculators/A&P                          Initial impression-patient presents after a fall.  He is alert, does not appear acute stress, vital signs are reassuring.  Concern for orthopedic injury will obtain imaging of face, head, neck, chest, wrist and hand for further evaluation.  Work-up-CT head maxillofacial C-spine all negative for acute findings.  DG of ribs negative for acute findings, DG of right wrist reveals mild irregularities of the tip of the radial styloid favors arthritis but if tender possible for nondisplaced fracture.  Reassessment-patient is reassessed after imaging, he was notably tender at the distal aspect of his radius I am concerned for possible styloid fracture.  will place him in a short arm splint and have him follow-up with orthopedic surgery.  Patient has notable skin tears, they are superficial, hemodynamically stable, do  not require any suturing at this time, unfortunately there is no skin to put back to cover up the wound.  will apply basic dressings over the area and recommendation wound care.  Rule out- low suspicion for intracranial head bleed as patient denies loss of conscious, is not on anticoagulant, she does not endorse headaches, paresthesia/weakness in the upper and lower extremities, no focal deficits present on my exam.  CT imaging negative for acute findings.  Low suspicion for spinal cord abnormality or spinal fracture spine as patient has no spinal tenderness patient has full range of motion in the upper and lower extremities.  Low suspicion for pneumothorax as lung sounds are clear bilaterally, x-ray is negative for acute findings.  Low suspicion for intra-abdominal trauma as abdomen soft nontender to palpation.   Plan-  Probable right styloid fracture-patient was placed in a short arm splint, will have him follow-up with orthopedic surgery for further evaluation.  Will recommend basic wound care for his skin tears, gave him strict return precautions.  Vital signs have remained stable, no indication for hospital admission.  Patient discussed with attending and they agreed with assessment and plan.  Patient given at home care as well strict return precautions.  Patient verbalized that they understood agreed to said plan.  Final Clinical Impression(s) / ED Diagnoses Final diagnoses:  Fall, initial encounter  Closed nondisplaced fracture of styloid process of right radius, initial encounter    Rx / DC Orders ED Discharge  Orders     None        Aron Baba 06/16/21 1728    Fredia Sorrow, MD 06/22/21 1556

## 2021-06-16 NOTE — ED Triage Notes (Signed)
Pt brought in by [facility] from home with c/o fall after he missed his chair. Skin tear to right wrist, forearm and elbow. Denies LOC. C-collar in place by EMS. Pt is disoriented, but family reports that's his normal.

## 2021-06-16 NOTE — Discharge Instructions (Signed)
Your imaging is concerning for possible styloid fracture of your right radius of placed you in a splint please wear.  I recommend keeping it elevated while not use, you may use over-the-counter pain medication as needed for pain management.  Please follow-up with orthopedic surgery for further evaluation.  He also noted skin abrasions these will heal on their own, please keep the area clean, and change the dressings 2 times daily.  Please follow-up with the PCP for further evaluation.  Come back to the emergency department if you develop chest pain, shortness of breath, severe abdominal pain, uncontrolled nausea, vomiting, diarrhea.

## 2021-06-16 NOTE — ED Notes (Signed)
Gave pt a bath and did peri care because he was soaked in urine. Nurse notified.

## 2021-06-17 ENCOUNTER — Telehealth: Payer: Self-pay | Admitting: Orthopedic Surgery

## 2021-06-17 NOTE — Telephone Encounter (Signed)
atient fell in the living room and went to the ER  patient is currently   possible styloid fracture of your right radius of placed you in a splint    per AVS   Patient is also on blood thinners.  His wife is wanting him seen today.    Do we need to bring him in today or is this something you want to wait till next week.  Please call his wife [name] at [phone]

## 2021-06-22 ENCOUNTER — Ambulatory Visit: Payer: PPO | Admitting: Orthopedic Surgery

## 2021-06-22 ENCOUNTER — Other Ambulatory Visit: Payer: Self-pay

## 2021-06-22 ENCOUNTER — Encounter: Payer: Self-pay | Admitting: Orthopedic Surgery

## 2021-06-22 DIAGNOSIS — M25531 Pain in right wrist: Secondary | ICD-10-CM | POA: Diagnosis not present

## 2021-06-22 DIAGNOSIS — S51811A Laceration without foreign body of right forearm, initial encounter: Secondary | ICD-10-CM | POA: Diagnosis not present

## 2021-06-22 DIAGNOSIS — S51812A Laceration without foreign body of left forearm, initial encounter: Secondary | ICD-10-CM | POA: Diagnosis not present

## 2021-06-22 DIAGNOSIS — S51819A Laceration without foreign body of unspecified forearm, initial encounter: Secondary | ICD-10-CM

## 2021-06-22 MED ORDER — CEPHALEXIN 250 MG PO CAPS: 250.0000 mg | ORAL_CAPSULE | Freq: Four times a day (QID) | ORAL | 0 refills | Status: DC

## 2021-06-22 NOTE — Progress Notes (Signed)
New Patient Visit  Assessment: Bernard Ramirez is a 87 y.o. male with the following: 1. Skin tears of bilateral forearm and wrist without complication 2. Pain in right wrist; wrist arthritis vs minimally displaced fracture of radial styloid  Plan: No pain in right wrist with gentle range of motion.  Do not need to continue with immobilization.  Skin tears of the dorsal right wrist, proximal forearm, as well as left forearm are clean and dry.   Will prescribe Keflex to avoid infection.  Daily dressing changes to both forearms.  Follow up in 1 week for wound check.  Call with issues.    Follow-up: Return in about 1 week (around 06/29/2021).  Subjective:  Chief Complaint  Patient presents with   Fall, with right wrist pain and multiple skin tears    History of Present Illness: Bernard Ramirez is a 87 y.o. male who presents for evaluation of right wrist pain.  He fell approximately 1 week ago.  He was seen in the ED and placed in a splint for a possible fracture.  He denies pain.  He has been unable to bear weight on his wrist when using his walker.  He is on a blood thinner and reports he has thin skin.    Review of Systems: No fevers or chills No numbness or tingling No chest pain No shortness of breath No bowel or bladder dysfunction No GI distress No headaches   Medical History:  Past Medical History:  Diagnosis Date   Anemia    04/2012: H&H-10.2/31.5, MCV-77; 06/2012:12/38.9   Arthritis    Benign prostatic hypertrophy    s/p transurethral resection of the prostate   Chest pain    2004; with dyspnea, stress nuclear-normal EF + questionable inferior wall ischemia, normal coronary angiography;  2010-negative stress nuclear   CVA (cerebral infarction)    asymptomatic (seen on CT)   Diarrhea    h/o Hemoccult-positive stool   Dysrhythmia    Edema of both legs    GERD (gastroesophageal reflux disease)    History of blood transfusion    History of kidney stones     History of urinary tract infection    Hypertension    Jerking movements of extremities    left arm    Low back pain    Obstructive sleep apnea    pt states can not use the CPAP   Peripheral neuropathy    lower legs and feet bilat    Persistent atrial fibrillation ([place]) 2013   Asymptomatic; diagnosed in 05/2012   Pneumonia    Prostate cancer ([place]) 2017   stage 4   Shortness of breath dyspnea    pt states only develops SOB if tries to do something too quickly   Upper GI bleed 1985   1985-peptic ulcer disease; initial hemigastrectomy; subsequent Billroth II   Urinary hesitancy    Vertigo    ED evaluation-06/2012    Past Surgical History:  Procedure Laterality Date   Billroth II     CHOLECYSTECTOMY     COLONOSCOPY  Approximately 2000   COLONOSCOPY  10/2012   Colonoscopy March 2014 at [facility], difficulty exam requiring fluoroscopy and overtube. Patient developed severe bradycardia again during his procedure like he did [facility]. Multiple polyps removed which were tubular adenomas. Previously tattooed sites at 20 and 30 cm were free of any polypoid change. Left-sided diverticulosis noted.   COLONOSCOPY WITH ESOPHAGOGASTRODUODENOSCOPY (EGD)  06-17-2009   ACZ:YSAYTKZS tubular adenomas and 2 tubulovillous adenomas,  incomplete, 3 clips placed   ESOPHAGOGASTRODUODENOSCOPY  06/17/2009   SLF: normal/chronic gastritis (non-h pylori)   ESOPHAGOGASTRODUODENOSCOPY N/A 05/11/2015   XVQ:MGQQPYP anemai due to postgastrectomy state/moderate erosive gastritis   INGUINAL HERNIA REPAIR     Left   ROTATOR CUFF REPAIR  1991   Bilateral   TRANSURETHRAL RESECTION OF PROSTATE  06/2016   TRANSURETHRAL RESECTION OF PROSTATE N/A 12/27/2012   Procedure: TRANSURETHRAL RESECTION OF THE PROSTATE (TURP);  Surgeon: Marissa Nestle, MD;  Location: [facility];  Service: Urology;  Laterality: N/A;   TRANSURETHRAL RESECTION OF PROSTATE N/A 04/11/2016   Procedure: TRANSURETHRAL RESECTION OF THE PROSTATE (TURP);   Surgeon: Irine Seal, MD;  Location: [facility];  Service: Urology;  Laterality: N/A;   VAGOTOMY AND PYLOROPLASTY  1970s   Details of procedure uncertain; 9509T    Family History  Problem Relation Age of Onset   Diabetes Mellitus II Mother    Allergic rhinitis Mother    Lung disease Father    Allergic rhinitis Father    Angioedema Neg Hx    Asthma Neg Hx    Atopy Neg Hx    Eczema Neg Hx    Immunodeficiency Neg Hx    Urticaria Neg Hx    Cystic fibrosis Neg Hx    Lupus Neg Hx    Emphysema Neg Hx    Migraines Neg Hx    Social History   Tobacco Use   Smoking status: Former    Types: Cigars    Quit date: 08/22/2003    Years since quitting: 17.8   Smokeless tobacco: Never   Tobacco comments:    Quit many years ago; few cigars per day; didn't inhale  Vaping Use   Vaping Use: Never used  Substance Use Topics   Alcohol use: No    Alcohol/week: 1.0 standard drink    Types: 1 Standard drinks or equivalent per week   Drug use: No    Allergies  Allergen Reactions   Contrast Media [Iodinated Diagnostic Agents]     Pt states he was given "dye" 20 yrs ago, anaphylaxis & syncope - was told to never have it again    Gabapentin Itching   Aspirin Other (See Comments)    Blistering    Latex Rash   Lipitor [Atorvastatin] Itching   Tape Rash    EKG leads    Current Meds  Medication Sig   cephALEXin (KEFLEX) 250 MG capsule Take 1 capsule (250 mg total) by mouth 4 (four) times daily.    Objective: There were no vitals taken for this visit.  Physical Exam:  General: Elderly male., Alert and oriented., and No acute distress. Gait: shuffling gait, using a walker.   Right hand is swollen.  Hands are covered with senile purpura.  Skin tear on dorsum of hand with blood clot.  Dorsal forearm skin tears with some serous drainage.  No surrounding erythema or purulent drainage.   Left forearm skin tear with some serous and fibrinous drainage.  No purulence.  No surrounding erythema.     IMAGING: I personally reviewed images previously obtained from the ED  XR of the right wrist with cysts in radial styloid.  No stepoff.  Does not appear to be an acute injury.    New Medications:  Meds ordered this encounter  Medications   cephALEXin (KEFLEX) 250 MG capsule    Sig: Take 1 capsule (250 mg total) by mouth 4 (four) times daily.    Dispense:  40 capsule  Refill:  0      Mordecai Rasmussen, MD  06/22/2021 9:45 PM

## 2021-06-22 NOTE — Patient Instructions (Signed)
Daily dressing changes to right elbow and hand

## 2021-06-29 ENCOUNTER — Other Ambulatory Visit: Payer: Self-pay

## 2021-06-29 ENCOUNTER — Ambulatory Visit (INDEPENDENT_AMBULATORY_CARE_PROVIDER_SITE_OTHER): Payer: PPO | Admitting: Orthopedic Surgery

## 2021-06-29 DIAGNOSIS — S51811A Laceration without foreign body of right forearm, initial encounter: Secondary | ICD-10-CM | POA: Diagnosis not present

## 2021-06-29 DIAGNOSIS — S51812A Laceration without foreign body of left forearm, initial encounter: Secondary | ICD-10-CM | POA: Diagnosis not present

## 2021-06-29 DIAGNOSIS — S51819A Laceration without foreign body of unspecified forearm, initial encounter: Secondary | ICD-10-CM

## 2021-06-30 ENCOUNTER — Encounter: Payer: Self-pay | Admitting: Orthopedic Surgery

## 2021-06-30 NOTE — Progress Notes (Signed)
Orthopaedic Clinic Return  Assessment: Bernard Ramirez is a 87 y.o. male with the following: Skin tears to bilateral forearms and hands   Plan: Daily dressing changes, and antibiotics are helping with his skin tears.  These appear healthy.  Continue with dressings as needed.  It is okay if he keeps these open to air, as this will solidify the serous drainage.  Once the scab forms, this will continue to heal.  He should continue with the antibiotics to completion.  Low up in 1 week for repeat evaluation.   Follow-up: Return in about 1 week (around 07/06/2021).   Subjective:  Chief Complaint  Patient presents with   Arm Pain    bilateral    History of Present Illness: Bernard Ramirez is a 87 y.o. male who returns to clinic for repeat evaluation of skin tears to bilateral forearms and hands.  He was seen in clinic approximately 1 week ago.  He has been continue to take his antibiotics.  He is continue with daily dressing changes.  No fevers or chills.  His pain is improving.  He does have some swelling on the dorsal aspect of his hand, which is tender to palpation.  He is concerned about this.  Otherwise, no new injuries.  Review of Systems: No fevers or chills No numbness or tingling No chest pain No shortness of breath No bowel or bladder dysfunction No GI distress No headaches   Objective: There were no vitals taken for this visit.  Physical Exam:  Early male.  No acute distress.  Evaluation of the right hand demonstrates some skin tears, with the largest being on the lateral aspect of his elbow.  There is some fibrinous exudate, with serous drainage.  No tenderness to palpation.  No surrounding erythema.  He also has a similar appearing lesion on the dorsal and ulnar aspect of the wrist.  He has some generalized swelling over the dorsum of his hand.  This is tender to palpation there is no warmth.  It is somewhat boggy, without obvious fluid collection.  Evaluation of  left forearm demonstrates skin tears over the lateral aspect of the elbow.  Once again, there is fibrinous exudate, with some serous drainage.  No surrounding erythema.  No purulence.  Remainder of the skin tears are dry and healing with scabs over them.  IMAGING: I personally ordered and reviewed the following images:  No new imaging obtained today.  Mordecai Rasmussen, MD 06/30/2021 1:08 PM

## 2021-07-06 ENCOUNTER — Other Ambulatory Visit: Payer: Self-pay

## 2021-07-06 ENCOUNTER — Encounter: Payer: Self-pay | Admitting: Orthopedic Surgery

## 2021-07-06 ENCOUNTER — Ambulatory Visit (INDEPENDENT_AMBULATORY_CARE_PROVIDER_SITE_OTHER): Payer: PPO | Admitting: Orthopedic Surgery

## 2021-07-06 VITALS — Ht 72.0 in | Wt 233.0 lb

## 2021-07-06 DIAGNOSIS — S51811D Laceration without foreign body of right forearm, subsequent encounter: Secondary | ICD-10-CM | POA: Diagnosis not present

## 2021-07-06 DIAGNOSIS — S51812D Laceration without foreign body of left forearm, subsequent encounter: Secondary | ICD-10-CM

## 2021-07-06 NOTE — Progress Notes (Signed)
Orthopaedic Clinic Return  Assessment: Bernard Ramirez is a 87 y.o. male with the following: Skin tears to bilateral forearms and hands   Plan: Multiple skin tears are healing well.  The swelling on the dorsum of his right hand is improving.  Okay to allow the remaining wounds to remain open to air.  Dressings as needed.  Complete oral antibiotics.  Follow-up as needed.  Follow-up: Return if symptoms worsen or fail to improve.   Subjective:  Chief Complaint  Patient presents with   Wound Check    After fall DOI 06/16/21    History of Present Illness: Bernard Ramirez is a 87 y.o. male who returns to clinic for repeat evaluation of skin tears to bilateral forearms and hands.  Injuries were sustained approximately 3 weeks ago.  He continues to take the antibiotics without issues.  His wounds are healing.  They have been completing daily dressing changes to only 1 remaining wound.  Pain is improving.  Swelling and pain to the dorsum of the right hand is improving.  Review of Systems: No fevers or chills No numbness or tingling No chest pain No shortness of breath No bowel or bladder dysfunction No GI distress No headaches   Objective: Ht 6' (1.829 m)   Wt 233 lb (105.7 kg)   BMI 31.60 kg/m   Physical Exam:  Early male.  No acute distress.  Swelling and bruising to the dorsum of the right hand is improving.  No tenderness to palpation.  Wounds and skin tears to the right forearm and wrist are dry.  No dressing.  No surrounding erythema or drainage.  He has a skin tear on the dorsum of his left forearm, which appears healthy.  No surrounding erythema.  No drainage.  There is some fibrinous material, as well as some granulation tissue with a small amount of fresh blood.  Otherwise no concerning features.  IMAGING: I personally ordered and reviewed the following images:  No new imaging obtained today.  Mordecai Rasmussen, MD 07/06/2021 11:52 AM

## 2021-07-20 ENCOUNTER — Other Ambulatory Visit: Payer: Self-pay | Admitting: Family Medicine

## 2021-07-26 ENCOUNTER — Other Ambulatory Visit: Payer: Self-pay | Admitting: Family Medicine

## 2021-07-28 ENCOUNTER — Other Ambulatory Visit: Payer: Self-pay

## 2021-07-28 ENCOUNTER — Telehealth: Payer: Self-pay

## 2021-07-28 ENCOUNTER — Other Ambulatory Visit: Payer: Self-pay | Admitting: Family Medicine

## 2021-07-28 MED ORDER — MECLIZINE HCL 25 MG PO TABS: ORAL_TABLET | ORAL | 3 refills | Status: DC

## 2021-07-28 NOTE — Telephone Encounter (Signed)
Refills sent

## 2021-07-28 NOTE — Telephone Encounter (Signed)
Pt's spouse called in requesting a refill of meclizine (ANTIVERT) 25 MG tablet   Cb#: [phone]

## 2021-08-05 ENCOUNTER — Telehealth: Payer: Self-pay | Admitting: Family Medicine

## 2021-08-05 ENCOUNTER — Other Ambulatory Visit: Payer: Self-pay | Admitting: Family Medicine

## 2021-08-05 MED ORDER — HYDROCODONE-ACETAMINOPHEN 10-325 MG PO TABS: 1.0000 | ORAL_TABLET | ORAL | 0 refills | Status: DC | PRN

## 2021-08-05 NOTE — Telephone Encounter (Signed)
Patient's spouse Tonia Ghent called to request refill of HYDROcodone-acetaminophen ([place]) 10-325 MG tablet [[id number]]   Pharmacy confirmed as  [facility], [address]. [place], [place] [address]  Phone:  [phone]  Fax:  [phone]  DEA #:  [id number]  Please advise at [phone]

## 2021-08-22 ENCOUNTER — Other Ambulatory Visit: Payer: Self-pay | Admitting: Family Medicine

## 2021-09-02 ENCOUNTER — Telehealth: Payer: Self-pay | Admitting: Pharmacist

## 2021-09-02 NOTE — Progress Notes (Signed)
Chronic Care Management Pharmacy Assistant   Name: ADDIEL MCCARDLE  MRN: [id number] DOB: February 16, 1931   Reason for Encounter: Disease State - Hypertension Call    Recent office visits:  06/14/21 Jenna Luo, MD - Family Medicine - Diabetes - Labs were ordered. Flu vaccine was administered.  No medication changes. Follow up as scheduled.   Recent consult visits:  07/06/21 Reeves Forth, MD - Orthopedics - Wound Check - No medication changes. Follow up as scheduled.   06/29/21 Reeves Forth, MD - Orthopedics - Wound Check - Daily dressing changes, and antibiotics are helping with his skin tears.  These appear healthy.  Continue with dressings as needed.  It is okay if he keeps these open to air, as this will solidify the serous drainage.  Once the scab forms, this will continue to heal.  He should continue with the antibiotics to completion.  Low up in 1 week for repeat evaluation.  06/22/21 Reeves Forth, MD - Orthopedics - Wound Check - No pain in right wrist with gentle range of motion.  Do not need to continue with immobilization.  Skin tears of the dorsal right wrist, proximal forearm, as well as left forearm are clean and dry.   Will prescribe Keflex to avoid infection.  Daily dressing changes to both forearms.  Follow up in 1 week for wound check.  Call with issues.   Hospital visits: 06/16/21 Medication Reconciliation was completed by comparing discharge summary, patients EMR and Pharmacy list, and upon discussion with patient.  Admitted to the hospital on 06/16/21 due to Fall. Discharge date was 06/16/21. Discharged from [facility]?Medications Started at [facility] Discharge:?? None noted  Medication Changes at Hospital Discharge: None noted  Medications Discontinued at Hospital Discharge: None noted  Medications that remain the same after Hospital Discharge:??  All other medications will remain the same.    Medications: Outpatient Encounter Medications as  of 09/02/2021  Medication Sig   cephALEXin (KEFLEX) 250 MG capsule Take 1 capsule (250 mg total) by mouth 4 (four) times daily.   clobetasol (TEMOVATE) 0.05 % external solution Apply 1 application topically 2 (two) times daily.   clobetasol cream (TEMOVATE) 8.78 % Apply 1 application topically daily as needed (use as directed for skin).   clopidogrel (PLAVIX) 75 MG tablet TAKE 1 TABLET(75 MG) BY MOUTH DAILY   furosemide (LASIX) 40 MG tablet Take 1 and 1/2 tabs in the AM and 1 tab in the afternoon   HYDROcodone-acetaminophen (NORCO) 10-325 MG tablet Take 1 tablet by mouth every 4 (four) hours as needed (maxium dose 5 tabs PO QD). Chronic Pain. Dx: G89.4.   losartan (COZAAR) 100 MG tablet TAKE 1 TABLET(100 MG) BY MOUTH DAILY   meclizine (ANTIVERT) 25 MG tablet Take one tablet by mouth at bedtime.   NON FORMULARY Diet:Regular, NAS   NYSTATIN powder APPLY TOPICALLY TO THE AFFECTED AREA EVERY DAY AS NEEDED FOR RASH   pantoprazole (PROTONIX) 40 MG tablet TAKE 1 TABLET(40 MG) BY MOUTH DAILY   potassium chloride SA (KLOR-CON) 20 MEQ tablet TAKE 1 TABLET(20 MEQ) BY MOUTH DAILY   pravastatin (PRAVACHOL) 20 MG tablet TAKE 1 TABLET(20 MG) BY MOUTH DAILY   silver sulfADIAZINE (SILVADENE) 1 % cream Apply 1 application topically 2 (two) times daily. Special Instructions: Clean left lower leg with NS. Apply silvadene. Cover with gauze, wrap with kling, cover with an ace wrap   silver sulfADIAZINE (SILVADENE) 1 % cream Apply 1 application topically daily.  triamcinolone cream (KENALOG) 0.1 % Apply 1 application topically daily as needed (for irritation).   No facility-administered encounter medications on file as of 09/02/2021.    Current antihypertensive regimen:    How often are you checking your Blood Pressure?     Current home BP readings:    What recent interventions/DTPs have been made by any provider to improve Blood Pressure control since last CPP Visit:     Any recent hospitalizations or  ED visits since last visit with CPP?  Patient had an ED visit on 06/16/21 for a Fall.    What diet changes have been made to improve Blood Pressure Control?     What exercise is being done to improve your Blood Pressure Control?      Adherence Review: Is the patient currently on ACE/ARB medication? Yes Does the patient have >5 day gap between last estimated fill dates? No   Care Gaps  AWV: done 05/05/21 Colonoscopy: upper EGD done 05/12/15 DM Eye Exam: N/A DM Foot Exam: N/A Microalbumin: unknown HbgAIC: done 06/14/21 (6.5) DEXA: N/A Mammogram: N/A  Star Rating Drugs: pravastatin (PRAVACHOL) 20 MG tablet - last filled 07/20/21 90 days  losartan (COZAAR) 100 MG tablet - last filled 07/28/21 90 days   Future Appointments  Date Time Provider [place]  09/15/2021 12:15 PM [name], MD [facility] None  05/11/2022  1:15 PM [facility]-NURSE HEALTH ADVISOR [facility] None    Multiple attempts were made to contact patient. Attempts were unsuccessful. / ls,CMA   Jobe Gibbon, [place] Pharmacist Assistant  [phone]

## 2021-09-15 ENCOUNTER — Other Ambulatory Visit: Payer: Self-pay

## 2021-09-15 ENCOUNTER — Encounter: Payer: Self-pay | Admitting: Family Medicine

## 2021-09-15 ENCOUNTER — Ambulatory Visit (INDEPENDENT_AMBULATORY_CARE_PROVIDER_SITE_OTHER): Payer: PPO | Admitting: Family Medicine

## 2021-09-15 VITALS — BP 128/68 | HR 57 | Temp 97.6°F | Resp 18 | Ht 72.0 in | Wt 237.0 lb

## 2021-09-15 DIAGNOSIS — R296 Repeated falls: Secondary | ICD-10-CM

## 2021-09-15 DIAGNOSIS — E118 Type 2 diabetes mellitus with unspecified complications: Secondary | ICD-10-CM | POA: Diagnosis not present

## 2021-09-15 DIAGNOSIS — I4819 Other persistent atrial fibrillation: Secondary | ICD-10-CM

## 2021-09-15 DIAGNOSIS — D582 Other hemoglobinopathies: Secondary | ICD-10-CM | POA: Diagnosis not present

## 2021-09-15 DIAGNOSIS — Z79899 Other long term (current) drug therapy: Secondary | ICD-10-CM | POA: Diagnosis not present

## 2021-09-15 NOTE — Progress Notes (Signed)
Subjective:    Patient ID: Bernard Ramirez, male    DOB: Jul 30, 1931, 88 y.o.   MRN: [id number]  Patient is an 88 year old Caucasian male here today for a checkup.  Patient has a history of permanent atrial fibrillation.  He also has borderline diabetes mellitus, congestive heart failure, and a history of an upper GI bleed.  Due to his history of upper GI bleed he is currently on Plavix rather than Eliquis for his atrial fibrillation.  He denies any chest pain or shortness of breath or dyspnea on exertion.  However he has become progressively more sedentary.  He is falling numerous times at home.  There is no ataxia today on exam.  Finger-nose testing is normal with no obvious neurologic deficit.  However he does have a slow shuffling gait.  Due to the weakness in his legs he is afraid to lift his legs to wall.  He is using a walker simply to maneuver from chair to his bathroom.  That is as far as he can walk at home. Past Medical History:  Diagnosis Date   Anemia    04/2012: H&H-10.2/31.5, MCV-77; 06/2012:12/38.9   Arthritis    Benign prostatic hypertrophy    s/p transurethral resection of the prostate   Chest pain    2004; with dyspnea, stress nuclear-normal EF + questionable inferior wall ischemia, normal coronary angiography;  2010-negative stress nuclear   CVA (cerebral infarction)    asymptomatic (seen on CT)   Diarrhea    h/o Hemoccult-positive stool   Dysrhythmia    Edema of both legs    GERD (gastroesophageal reflux disease)    History of blood transfusion    History of kidney stones    History of urinary tract infection    Hypertension    Jerking movements of extremities    left arm    Low back pain    Obstructive sleep apnea    pt states can not use the CPAP   Peripheral neuropathy    lower legs and feet bilat    Persistent atrial fibrillation ([place]) 2013   Asymptomatic; diagnosed in 05/2012   Pneumonia    Prostate cancer ([place]) 2017   stage 4   Shortness of breath  dyspnea    pt states only develops SOB if tries to do something too quickly   Upper GI bleed 1985   1985-peptic ulcer disease; initial hemigastrectomy; subsequent Billroth II   Urinary hesitancy    Vertigo    ED evaluation-06/2012   Past Surgical History:  Procedure Laterality Date   Billroth II     CHOLECYSTECTOMY     COLONOSCOPY  Approximately 2000   COLONOSCOPY  10/2012   Colonoscopy March 2014 at [facility], difficulty exam requiring fluoroscopy and overtube. Patient developed severe bradycardia again during his procedure like he did [facility]. Multiple polyps removed which were tubular adenomas. Previously tattooed sites at 20 and 30 cm were free of any polypoid change. Left-sided diverticulosis noted.   COLONOSCOPY WITH ESOPHAGOGASTRODUODENOSCOPY (EGD)  06-17-2009   KPT:WSFKCLEX tubular adenomas and 2 tubulovillous adenomas, incomplete, 3 clips placed   ESOPHAGOGASTRODUODENOSCOPY  06/17/2009   SLF: normal/chronic gastritis (non-h pylori)   ESOPHAGOGASTRODUODENOSCOPY N/A 05/11/2015   NTZ:GYFVCBS anemai due to postgastrectomy state/moderate erosive gastritis   INGUINAL HERNIA REPAIR     Left   ROTATOR CUFF REPAIR  1991   Bilateral   TRANSURETHRAL RESECTION OF PROSTATE  06/2016   TRANSURETHRAL RESECTION OF PROSTATE N/A 12/27/2012   Procedure: TRANSURETHRAL RESECTION  OF THE PROSTATE (TURP);  Surgeon: Marissa Nestle, MD;  Location: [facility];  Service: Urology;  Laterality: N/A;   TRANSURETHRAL RESECTION OF PROSTATE N/A 04/11/2016   Procedure: TRANSURETHRAL RESECTION OF THE PROSTATE (TURP);  Surgeon: Irine Seal, MD;  Location: [facility];  Service: Urology;  Laterality: N/A;   VAGOTOMY AND PYLOROPLASTY  1970s   Details of procedure uncertain; 5462V   Current Outpatient Medications on File Prior to Visit  Medication Sig Dispense Refill   cephALEXin (KEFLEX) 250 MG capsule Take 1 capsule (250 mg total) by mouth 4 (four) times daily. 40 capsule 0   clobetasol (TEMOVATE) 0.05 % external  solution Apply 1 application topically 2 (two) times daily. 50 mL 2   clobetasol cream (TEMOVATE) 0.35 % Apply 1 application topically daily as needed (use as directed for skin). 30 g 0   clopidogrel (PLAVIX) 75 MG tablet TAKE 1 TABLET(75 MG) BY MOUTH DAILY 30 tablet 0   furosemide (LASIX) 40 MG tablet Take 1 and 1/2 tabs in the AM and 1 tab in the afternoon 90 tablet 0   HYDROcodone-acetaminophen (NORCO) 10-325 MG tablet Take 1 tablet by mouth every 4 (four) hours as needed (maxium dose 5 tabs PO QD). Chronic Pain. Dx: G89.4. 150 tablet 0   losartan (COZAAR) 100 MG tablet TAKE 1 TABLET(100 MG) BY MOUTH DAILY 90 tablet 0   meclizine (ANTIVERT) 25 MG tablet Take one tablet by mouth at bedtime. 30 tablet 3   NON FORMULARY Diet:Regular, NAS     NYSTATIN powder APPLY TOPICALLY TO THE AFFECTED AREA EVERY DAY AS NEEDED FOR RASH 60 g 3   pantoprazole (PROTONIX) 40 MG tablet TAKE 1 TABLET(40 MG) BY MOUTH DAILY 90 tablet 3   potassium chloride SA (KLOR-CON) 20 MEQ tablet TAKE 1 TABLET(20 MEQ) BY MOUTH DAILY 30 tablet 0   pravastatin (PRAVACHOL) 20 MG tablet TAKE 1 TABLET(20 MG) BY MOUTH DAILY 90 tablet 1   silver sulfADIAZINE (SILVADENE) 1 % cream Apply 1 application topically 2 (two) times daily. Special Instructions: Clean left lower leg with NS. Apply silvadene. Cover with gauze, wrap with kling, cover with an ace wrap 50 g 0   silver sulfADIAZINE (SILVADENE) 1 % cream Apply 1 application topically daily. 400 g 0   triamcinolone cream (KENALOG) 0.1 % Apply 1 application topically daily as needed (for irritation). 30 g 0   No current facility-administered medications on file prior to visit.   Allergies  Allergen Reactions   Contrast Media [Iodinated Contrast Media]     Pt states he was given "dye" 20 yrs ago, anaphylaxis & syncope - was told to never have it again    Gabapentin Itching   Aspirin Other (See Comments)    Blistering    Latex Rash   Lipitor [Atorvastatin] Itching   Tape Rash    EKG  leads   Social History   Socioeconomic History   Marital status: Married    Spouse name: [name]   Number of children: 5   Years of education: Not on file   Highest education level: Not on file  Occupational History   Occupation: Retired Pensions consultant: RETIRED  Tobacco Use   Smoking status: Former    Types: Cigars    Quit date: 08/22/2003    Years since quitting: 18.0   Smokeless tobacco: Never   Tobacco comments:    Quit many years ago; few cigars per day; didn't inhale  Vaping Use   Vaping Use: Never  used  Substance and Sexual Activity   Alcohol use: No    Alcohol/week: 1.0 standard drink    Types: 1 Standard drinks or equivalent per week   Drug use: No   Sexual activity: Not on file  Other Topics Concern   Not on file  Social History Narrative   Lives w/ wife in [place]   Retired truck Geophysicist/field seismologist   Social Determinants of Radio broadcast assistant Strain: Low Risk    Difficulty of Paying Living Expenses: Not hard at all  Food Insecurity: No Food Insecurity   Worried About Charity fundraiser in the Last Year: Never true   Arboriculturist in the Last Year: Never true  Transportation Needs: No Transportation Needs   Lack of Transportation (Medical): No   Lack of Transportation (Non-Medical): No  Physical Activity: Inactive   Days of Exercise per Week: 0 days   Minutes of Exercise per Session: 0 min  Stress: No Stress Concern Present   Feeling of Stress : Not at all  Social Connections: Socially Isolated   Frequency of Communication with Friends and Family: Never   Frequency of Social Gatherings with Friends and Family: Once a week   Attends Religious Services: Never   Marine scientist or Organizations: No   Attends Music therapist: Never   Marital Status: Married  Human resources officer Violence: Not At Risk   Fear of Current or Ex-Partner: No   Emotionally Abused: No   Physically Abused: No   Sexually Abused: No      Review  of Systems  All other systems reviewed and are negative.     Objective:   Physical Exam Vitals reviewed.  Constitutional:      General: He is not in acute distress.    Appearance: Normal appearance. He is obese. He is not ill-appearing or toxic-appearing.  Cardiovascular:     Rate and Rhythm: Normal rate. Rhythm irregular.     Heart sounds: Normal heart sounds. No murmur heard.   No friction rub. No gallop.  Pulmonary:     Effort: Pulmonary effort is normal. No respiratory distress.     Breath sounds: Normal breath sounds. No stridor. No wheezing, rhonchi or rales.  Abdominal:     General: Abdomen is flat. Bowel sounds are normal. There is no distension.     Palpations: Abdomen is soft. There is no mass.     Tenderness: There is no abdominal tenderness. There is no guarding.  Musculoskeletal:     Right lower leg: No edema.     Left lower leg: No edema.  Skin:    Findings: No erythema.  Neurological:     General: No focal deficit present.     Mental Status: He is alert and oriented to person, place, and time.     Cranial Nerves: No cranial nerve deficit.     Sensory: No sensory deficit.     Motor: No weakness.     Coordination: Coordination normal.     Gait: Gait abnormal (Shuffling gait.  Requires a cane to ambulate).     Deep Tendon Reflexes: Reflexes normal.  Psychiatric:        Mood and Affect: Mood normal.        Thought Content: Thought content normal.        Judgment: Judgment normal.          Assessment & Plan:  Controlled type 2 diabetes mellitus with complication, without long-term current use  of insulin ([place]) - Plan: CBC with Differential/Platelet, COMPLETE METABOLIC PANEL WITH GFR, Hemoglobin A1c  Falls frequently  Persistent atrial fibrillation ([place]) I fear his falls are due to weakness and deconditioning.  I believe that he would benefit from intensive physical therapy.  However at the present time I am working his wife for dyspnea and not present  congestive heart failure.  I will await the results of her work-up and then once we have a plan in place with his wife, I will plan to pursue physical therapy for this gentleman.  Meanwhile check CBC CMP and A1c.  Blood pressure today is outstanding.  Heart rate is irregular but he is rate controlled.  He is unable to take chronic anticoagulation due to a history of GI bleeding.  Therefore they have elected to continue him on Plavix due to history of stroke

## 2021-09-16 ENCOUNTER — Other Ambulatory Visit: Payer: Self-pay

## 2021-09-16 ENCOUNTER — Other Ambulatory Visit: Payer: PPO

## 2021-09-16 ENCOUNTER — Telehealth: Payer: Self-pay

## 2021-09-16 DIAGNOSIS — D582 Other hemoglobinopathies: Secondary | ICD-10-CM

## 2021-09-16 MED ORDER — HYDROCODONE-ACETAMINOPHEN 10-325 MG PO TABS: 1.0000 | ORAL_TABLET | ORAL | 0 refills | Status: DC | PRN

## 2021-09-16 NOTE — Telephone Encounter (Signed)
-----   Message from [name], MD sent at 09/16/2021  6:42 AM EST ----- Hemoglobin has dropped from 11.3-10.4.  Given his history of GI bleeding, I would check his stool for blood x3.  I would also recommend checking an iron level and a B12 level.  He will need to come in for stool cards and labs.

## 2021-09-16 NOTE — Telephone Encounter (Signed)
LOV 09/15/21 Last refill 08/05/21, #150, 0 refills  Please review, thanks!

## 2021-09-16 NOTE — Telephone Encounter (Signed)
Advised patient wife of results and recommendations.  Follow up scheduled.  Order placed.

## 2021-09-16 NOTE — Telephone Encounter (Signed)
Pt's spouse came in to request a refill of HYDROcodone-acetaminophen (NORCO) 10-325 MG tablet   Cb#: [phone]

## 2021-09-17 ENCOUNTER — Other Ambulatory Visit: Payer: Self-pay | Admitting: Internal Medicine

## 2021-09-17 LAB — HEMOGLOBIN A1C
Hgb A1c MFr Bld: 6.7 % of total Hgb — ABNORMAL HIGH (ref <5.7)
Mean Plasma Glucose: 146 mg/dL
eAG (mmol/L): 8.1 mmol/L

## 2021-09-17 LAB — CBC WITH DIFFERENTIAL/PLATELET
Absolute Monocytes: 667 cells/uL (ref 200–950)
Basophils Absolute: 11 cells/uL (ref 0–200)
Basophils Relative: 0.2 %
Eosinophils Absolute: 194 cells/uL (ref 15–500)
Eosinophils Relative: 3.4 %
HCT: 35.2 % — ABNORMAL LOW (ref 38.5–50.0)
Hemoglobin: 10.4 g/dL — ABNORMAL LOW (ref 13.2–17.1)
Lymphs Abs: 1112 cells/uL (ref 850–3900)
MCH: 24.9 pg — ABNORMAL LOW (ref 27.0–33.0)
MCHC: 29.5 g/dL — ABNORMAL LOW (ref 32.0–36.0)
MCV: 84.2 fL (ref 80.0–100.0)
MPV: 11.4 fL (ref 7.5–12.5)
Monocytes Relative: 11.7 %
Neutro Abs: 3716 cells/uL (ref 1500–7800)
Neutrophils Relative %: 65.2 %
Platelets: 231 10*3/uL (ref 140–400)
RBC: 4.18 10*6/uL — ABNORMAL LOW (ref 4.20–5.80)
RDW: 13.9 % (ref 11.0–15.0)
Total Lymphocyte: 19.5 %
WBC: 5.7 10*3/uL (ref 3.8–10.8)

## 2021-09-17 LAB — COMPLETE METABOLIC PANEL WITH GFR
AG Ratio: 1.5 (calc) (ref 1.0–2.5)
ALT: 8 U/L — ABNORMAL LOW (ref 9–46)
AST: 14 U/L (ref 10–35)
Albumin: 3.7 g/dL (ref 3.6–5.1)
Alkaline phosphatase (APISO): 88 U/L (ref 35–144)
BUN: 18 mg/dL (ref 7–25)
CO2: 31 mmol/L (ref 20–32)
Calcium: 9.1 mg/dL (ref 8.6–10.3)
Chloride: 101 mmol/L (ref 98–110)
Creat: 1.21 mg/dL (ref 0.70–1.22)
Globulin: 2.4 g/dL (calc) (ref 1.9–3.7)
Glucose, Bld: 131 mg/dL — ABNORMAL HIGH (ref 65–99)
Potassium: 4.2 mmol/L (ref 3.5–5.3)
Sodium: 139 mmol/L (ref 135–146)
Total Bilirubin: 1.2 mg/dL (ref 0.2–1.2)
Total Protein: 6.1 g/dL (ref 6.1–8.1)
eGFR: 58 mL/min/{1.73_m2} — ABNORMAL LOW (ref >=60)

## 2021-09-17 LAB — TEST AUTHORIZATION

## 2021-09-17 LAB — B12 AND FOLATE PANEL
Folate: 13.7 ng/mL
Vitamin B-12: 198 pg/mL — ABNORMAL LOW (ref 200–1100)

## 2021-09-17 LAB — IRON,TIBC AND FERRITIN PANEL
%SAT: 8 % (calc) — ABNORMAL LOW (ref 20–48)
Ferritin: 25 ng/mL (ref 24–380)
Iron: 32 ug/dL — ABNORMAL LOW (ref 50–180)
TIBC: 426 mcg/dL (calc) — ABNORMAL HIGH (ref 250–425)

## 2021-09-19 ENCOUNTER — Other Ambulatory Visit: Payer: Self-pay

## 2021-09-19 DIAGNOSIS — D582 Other hemoglobinopathies: Secondary | ICD-10-CM | POA: Diagnosis not present

## 2021-09-19 NOTE — Addendum Note (Signed)
Addended by: Wadie Lessen on: 09/19/2021 02:36 PM   Modules accepted: Orders

## 2021-09-20 ENCOUNTER — Telehealth: Payer: Self-pay

## 2021-09-20 LAB — FECAL GLOBIN BY IMMUNOCHEMISTRY
FECAL GLOBIN RESULT:: NOT DETECTED
MICRO NUMBER:: 12937227
SPECIMEN QUALITY:: ADEQUATE

## 2021-09-20 NOTE — Telephone Encounter (Signed)
-----   Message from [name], MD sent at 09/19/2021  6:42 AM EST ----- Iron and b12 are both low.  Start b12 1000 mcg sq monthly and ferrous sulfate 325 mg poqday and recheck cbc in 3 months.

## 2021-09-20 NOTE — Telephone Encounter (Signed)
Left message for patient regarding results and recommendations.

## 2021-10-05 ENCOUNTER — Telehealth: Payer: Self-pay | Admitting: Pharmacist

## 2021-10-05 NOTE — Progress Notes (Signed)
Chronic Care Management Pharmacy Assistant   Name: Bernard Ramirez  MRN: [id number] DOB: 08-30-1930   Reason for Encounter: Disease State - Hypertension Call    Recent office visits:   09/15/21 Jenna Luo, MD - Family Medicine - Diabetes - Labs were ordered. Referral to PT placed.   06/14/21 Jenna Luo, MD - Family Medicine - Diabetes - Labs were ordered. Flu vaccine was administered.  No medication changes. Follow up as scheduled.   Recent consult visits:   07/06/21 Reeves Forth, MD - Orthopedics - Wound Check - No medication changes. Follow up as scheduled.    06/29/21 Reeves Forth, MD - Orthopedics - Wound Check - Daily dressing changes, and antibiotics are helping with his skin tears.  These appear healthy.  Continue with dressings as needed.  It is okay if he keeps these open to air, as this will solidify the serous drainage.  Once the scab forms, this will continue to heal.  He should continue with the antibiotics to completion.  Low up in 1 week for repeat evaluation.   06/22/21 Reeves Forth, MD - Orthopedics - Wound Check - No pain in right wrist with gentle range of motion.  Do not need to continue with immobilization.  Skin tears of the dorsal right wrist, proximal forearm, as well as left forearm are clean and dry.   Will prescribe Keflex to avoid infection.  Daily dressing changes to both forearms.  Follow up in 1 week for wound check.  Call with issues.  Hospital visits: 06/16/21 Medication Reconciliation was completed by comparing discharge summary, patients EMR and Pharmacy list, and upon discussion with patient.   Admitted to the hospital on 06/16/21 due to Fall. Discharge date was 06/16/21. Discharged from [facility]?Medications Started at [facility] Discharge:?? None noted   Medication Changes at Hospital Discharge: None noted   Medications Discontinued at Hospital Discharge: None noted   Medications that remain the same after Hospital  Discharge:??  All other medications will remain the same.    Medications: Outpatient Encounter Medications as of 10/05/2021  Medication Sig   clobetasol (TEMOVATE) 0.05 % external solution Apply 1 application topically 2 (two) times daily.   clobetasol cream (TEMOVATE) 8.29 % Apply 1 application topically daily as needed (use as directed for skin).   clopidogrel (PLAVIX) 75 MG tablet TAKE 1 TABLET(75 MG) BY MOUTH DAILY   furosemide (LASIX) 40 MG tablet TAKE 1 AND 1/2 TABLETS BY MOUTH EVERY MORNING THEN TAKE 1 TABLET BY MOUTH EVERY AFTERNOON   HYDROcodone-acetaminophen (NORCO) 10-325 MG tablet Take 1 tablet by mouth every 4 (four) hours as needed (maxium dose 5 tabs PO QD). Chronic Pain. Dx: G89.4.   losartan (COZAAR) 100 MG tablet TAKE 1 TABLET(100 MG) BY MOUTH DAILY   meclizine (ANTIVERT) 25 MG tablet Take one tablet by mouth at bedtime.   NON FORMULARY Diet:Regular, NAS   NYSTATIN powder APPLY TOPICALLY TO THE AFFECTED AREA EVERY DAY AS NEEDED FOR RASH   pantoprazole (PROTONIX) 40 MG tablet TAKE 1 TABLET(40 MG) BY MOUTH DAILY   potassium chloride SA (KLOR-CON) 20 MEQ tablet TAKE 1 TABLET(20 MEQ) BY MOUTH DAILY   pravastatin (PRAVACHOL) 20 MG tablet TAKE 1 TABLET(20 MG) BY MOUTH DAILY   silver sulfADIAZINE (SILVADENE) 1 % cream Apply 1 application topically daily.   triamcinolone cream (KENALOG) 0.1 % Apply 1 application topically daily as needed (for irritation).   No facility-administered encounter medications on file as of 10/05/2021.  Current antihypertensive regimen:   Losartan 100 mg 1 tablet by mouth daily.   How often are you checking your Blood Pressure?        Current home BP readings:      What recent interventions/DTPs have been made by any provider to improve Blood Pressure control since last CPP Visit:        Any recent hospitalizations or ED visits since last visit with CPP?  Patient had an ED visit on 06/16/21 for a Fall.      What diet changes have been  made to improve Blood Pressure Control?        What exercise is being done to improve your Blood Pressure Control?          Adherence Review: Is the patient currently on ACE/ARB medication? Yes Does the patient have >5 day gap between last estimated fill dates? No     Care Gaps   AWV: done 05/05/21 Colonoscopy: upper EGD done 05/12/15 DM Eye Exam: N/A DM Foot Exam: N/A Microalbumin: unknown HbgAIC: done 06/14/21 (6.5) DEXA: N/A Mammogram: N/A   Star Rating Drugs: pravastatin (PRAVACHOL) 20 MG tablet - last filled 07/20/21 90 days  losartan (COZAAR) 100 MG tablet - last filled 07/28/21 90 days    Future Appointments  Date Time Provider [place]  12/15/2021 12:15 PM [name], MD [facility] None  05/11/2022  2:00 PM [facility]-NURSE HEALTH ADVISOR [facility] None    Multiple attempts were made to contact patient. Attempts were unsuccessful. / ls,CMA   Jobe Gibbon, [place] Pharmacist Assistant  [phone]

## 2021-10-17 ENCOUNTER — Other Ambulatory Visit: Payer: Self-pay

## 2021-10-17 ENCOUNTER — Ambulatory Visit (INDEPENDENT_AMBULATORY_CARE_PROVIDER_SITE_OTHER): Payer: PPO | Admitting: Family Medicine

## 2021-10-17 ENCOUNTER — Encounter: Payer: Self-pay | Admitting: Family Medicine

## 2021-10-17 VITALS — BP 128/72 | HR 106 | Temp 97.3°F | Resp 18 | Ht 72.0 in | Wt 240.0 lb

## 2021-10-17 DIAGNOSIS — R221 Localized swelling, mass and lump, neck: Secondary | ICD-10-CM | POA: Diagnosis not present

## 2021-10-17 MED ORDER — HYDROCODONE-ACETAMINOPHEN 10-325 MG PO TABS: 1.0000 | ORAL_TABLET | ORAL | 0 refills | Status: DC | PRN

## 2021-10-17 NOTE — Progress Notes (Signed)
Subjective:    Patient ID: Bernard Ramirez, male    DOB: Dec 30, 1930, 88 y.o.   MRN: [id number]  Patient has a rapidly enlarging mass on the right side of his neck.  Papule roughly 3-1/2 cm in diameter.  He states that it bleeds easily.  It is tender to the touch.  It is ulcerated on the surface and is covered in telangiectasias.  It appears to be either a rapidly enlarging squamous cell carcinoma or basal cell carcinoma.  Past Medical History:  Diagnosis Date   Anemia    04/2012: H&H-10.2/31.5, MCV-77; 06/2012:12/38.9   Arthritis    Benign prostatic hypertrophy    s/p transurethral resection of the prostate   Chest pain    2004; with dyspnea, stress nuclear-normal EF + questionable inferior wall ischemia, normal coronary angiography;  2010-negative stress nuclear   CVA (cerebral infarction)    asymptomatic (seen on CT)   Diarrhea    h/o Hemoccult-positive stool   Dysrhythmia    Edema of both legs    GERD (gastroesophageal reflux disease)    History of blood transfusion    History of kidney stones    History of urinary tract infection    Hypertension    Jerking movements of extremities    left arm    Low back pain    Obstructive sleep apnea    pt states can not use the CPAP   Peripheral neuropathy    lower legs and feet bilat    Persistent atrial fibrillation ([place]) 2013   Asymptomatic; diagnosed in 05/2012   Pneumonia    Prostate cancer ([place]) 2017   stage 4   Shortness of breath dyspnea    pt states only develops SOB if tries to do something too quickly   Upper GI bleed 1985   1985-peptic ulcer disease; initial hemigastrectomy; subsequent Billroth II   Urinary hesitancy    Vertigo    ED evaluation-06/2012   Past Surgical History:  Procedure Laterality Date   Billroth II     CHOLECYSTECTOMY     COLONOSCOPY  Approximately 2000   COLONOSCOPY  10/2012   Colonoscopy March 2014 at [facility], difficulty exam requiring fluoroscopy and overtube. Patient developed severe  bradycardia again during his procedure like he did [facility]. Multiple polyps removed which were tubular adenomas. Previously tattooed sites at 20 and 30 cm were free of any polypoid change. Left-sided diverticulosis noted.   COLONOSCOPY WITH ESOPHAGOGASTRODUODENOSCOPY (EGD)  06-17-2009   IOX:[id number] tubular adenomas and 2 tubulovillous adenomas, incomplete, 3 clips placed   ESOPHAGOGASTRODUODENOSCOPY  06/17/2009   SLF: normal/chronic gastritis (non-h pylori)   ESOPHAGOGASTRODUODENOSCOPY N/A 05/11/2015   EQA:[id number] anemai due to postgastrectomy state/moderate erosive gastritis   INGUINAL HERNIA REPAIR     Left   ROTATOR CUFF REPAIR  1991   Bilateral   TRANSURETHRAL RESECTION OF PROSTATE  06/2016   TRANSURETHRAL RESECTION OF PROSTATE N/A 12/27/2012   Procedure: TRANSURETHRAL RESECTION OF THE PROSTATE (TURP);  Surgeon: Marissa Nestle, MD;  Location: [facility];  Service: Urology;  Laterality: N/A;   TRANSURETHRAL RESECTION OF PROSTATE N/A 04/11/2016   Procedure: TRANSURETHRAL RESECTION OF THE PROSTATE (TURP);  Surgeon: Irine Seal, MD;  Location: [facility];  Service: Urology;  Laterality: N/A;   VAGOTOMY AND PYLOROPLASTY  1970s   Details of procedure uncertain; 2297L   Current Outpatient Medications on File Prior to Visit  Medication Sig Dispense Refill   clobetasol (TEMOVATE) 0.05 % external solution Apply 1 application topically 2 (two)  times daily. 50 mL 2   clobetasol cream (TEMOVATE) 1.88 % Apply 1 application topically daily as needed (use as directed for skin). 30 g 0   clopidogrel (PLAVIX) 75 MG tablet TAKE 1 TABLET(75 MG) BY MOUTH DAILY 30 tablet 0   furosemide (LASIX) 40 MG tablet TAKE 1 AND 1/2 TABLETS BY MOUTH EVERY MORNING THEN TAKE 1 TABLET BY MOUTH EVERY AFTERNOON 75 tablet 1   HYDROcodone-acetaminophen (NORCO) 10-325 MG tablet Take 1 tablet by mouth every 4 (four) hours as needed (maxium dose 5 tabs PO QD). Chronic Pain. Dx: G89.4. 150 tablet 0   losartan (COZAAR) 100 MG  tablet TAKE 1 TABLET(100 MG) BY MOUTH DAILY 90 tablet 0   meclizine (ANTIVERT) 25 MG tablet Take one tablet by mouth at bedtime. 30 tablet 3   NON FORMULARY Diet:Regular, NAS     NYSTATIN powder APPLY TOPICALLY TO THE AFFECTED AREA EVERY DAY AS NEEDED FOR RASH 60 g 3   pantoprazole (PROTONIX) 40 MG tablet TAKE 1 TABLET(40 MG) BY MOUTH DAILY 90 tablet 3   potassium chloride SA (KLOR-CON) 20 MEQ tablet TAKE 1 TABLET(20 MEQ) BY MOUTH DAILY 30 tablet 0   pravastatin (PRAVACHOL) 20 MG tablet TAKE 1 TABLET(20 MG) BY MOUTH DAILY 90 tablet 1   silver sulfADIAZINE (SILVADENE) 1 % cream Apply 1 application topically daily. 400 g 0   triamcinolone cream (KENALOG) 0.1 % Apply 1 application topically daily as needed (for irritation). 30 g 0   No current facility-administered medications on file prior to visit.   Allergies  Allergen Reactions   Contrast Media [Iodinated Contrast Media]     Pt states he was given "dye" 20 yrs ago, anaphylaxis & syncope - was told to never have it again    Gabapentin Itching   Aspirin Other (See Comments)    Blistering    Latex Rash   Lipitor [Atorvastatin] Itching   Tape Rash    EKG leads   Social History   Socioeconomic History   Marital status: Married    Spouse name: [name]   Number of children: 5   Years of education: Not on file   Highest education level: Not on file  Occupational History   Occupation: Retired Pensions consultant: RETIRED  Tobacco Use   Smoking status: Former    Types: Cigars    Quit date: 08/22/2003    Years since quitting: 18.1   Smokeless tobacco: Never   Tobacco comments:    Quit many years ago; few cigars per day; didn't inhale  Vaping Use   Vaping Use: Never used  Substance and Sexual Activity   Alcohol use: No    Alcohol/week: 1.0 standard drink    Types: 1 Standard drinks or equivalent per week   Drug use: No   Sexual activity: Not on file  Other Topics Concern   Not on file  Social History Narrative   Lives  w/ wife in [place]   Retired truck Geophysicist/field seismologist   Social Determinants of Radio broadcast assistant Strain: Low Risk    Difficulty of Paying Living Expenses: Not hard at all  Food Insecurity: No Food Insecurity   Worried About Charity fundraiser in the Last Year: Never true   Arboriculturist in the Last Year: Never true  Transportation Needs: No Transportation Needs   Lack of Transportation (Medical): No   Lack of Transportation (Non-Medical): No  Physical Activity: Inactive   Days of Exercise per Week:  0 days   Minutes of Exercise per Session: 0 min  Stress: No Stress Concern Present   Feeling of Stress : Not at all  Social Connections: Socially Isolated   Frequency of Communication with Friends and Family: Never   Frequency of Social Gatherings with Friends and Family: Once a week   Attends Religious Services: Never   Marine scientist or Organizations: No   Attends Music therapist: Never   Marital Status: Married  Human resources officer Violence: Not At Risk   Fear of Current or Ex-Partner: No   Emotionally Abused: No   Physically Abused: No   Sexually Abused: No      Review of Systems  All other systems reviewed and are negative.     Objective:   Physical Exam Vitals reviewed.  Constitutional:      General: He is not in acute distress.    Appearance: Normal appearance. He is obese. He is not ill-appearing or toxic-appearing.  Neck:   Cardiovascular:     Rate and Rhythm: Normal rate. Rhythm irregular.     Heart sounds: Normal heart sounds. No murmur heard.   No friction rub. No gallop.  Pulmonary:     Effort: Pulmonary effort is normal. No respiratory distress.     Breath sounds: Normal breath sounds. No stridor. No wheezing, rhonchi or rales.  Abdominal:     General: Abdomen is flat. Bowel sounds are normal. There is no distension.     Palpations: Abdomen is soft. There is no mass.     Tenderness: There is no abdominal tenderness. There is no  guarding.  Musculoskeletal:     Right lower leg: No edema.     Left lower leg: No edema.  Skin:    Findings: No erythema.  Neurological:     General: No focal deficit present.     Mental Status: He is alert and oriented to person, place, and time.     Cranial Nerves: No cranial nerve deficit.     Sensory: No sensory deficit.     Motor: No weakness.     Coordination: Coordination normal.     Gait: Gait abnormal (Shuffling gait.  Requires a cane to ambulate).     Deep Tendon Reflexes: Reflexes normal.  Psychiatric:        Mood and Affect: Mood normal.        Thought Content: Thought content normal.        Judgment: Judgment normal.          Assessment & Plan:  Neck mass Given the size and location and rapid enlargement, I feel this is most likely malignancy.  Recommended dermatology consultation for excision.  We will arrange this is as soon as possible

## 2021-10-19 DIAGNOSIS — D485 Neoplasm of uncertain behavior of skin: Secondary | ICD-10-CM | POA: Diagnosis not present

## 2021-10-19 DIAGNOSIS — D487 Neoplasm of uncertain behavior of other specified sites: Secondary | ICD-10-CM | POA: Diagnosis not present

## 2021-10-19 DIAGNOSIS — Z1283 Encounter for screening for malignant neoplasm of skin: Secondary | ICD-10-CM | POA: Diagnosis not present

## 2021-10-19 DIAGNOSIS — Z85828 Personal history of other malignant neoplasm of skin: Secondary | ICD-10-CM | POA: Diagnosis not present

## 2021-10-20 ENCOUNTER — Other Ambulatory Visit: Payer: Self-pay | Admitting: Family Medicine

## 2021-10-24 ENCOUNTER — Other Ambulatory Visit: Payer: Self-pay | Admitting: Family Medicine

## 2021-11-01 ENCOUNTER — Other Ambulatory Visit: Payer: Self-pay

## 2021-11-01 ENCOUNTER — Ambulatory Visit (INDEPENDENT_AMBULATORY_CARE_PROVIDER_SITE_OTHER): Payer: PPO | Admitting: Family Medicine

## 2021-11-01 ENCOUNTER — Encounter: Payer: Self-pay | Admitting: Family Medicine

## 2021-11-01 VITALS — BP 118/62 | HR 60 | Temp 97.3°F | Resp 18 | Wt 235.0 lb

## 2021-11-01 DIAGNOSIS — R600 Localized edema: Secondary | ICD-10-CM | POA: Diagnosis not present

## 2021-11-01 DIAGNOSIS — I5020 Unspecified systolic (congestive) heart failure: Secondary | ICD-10-CM

## 2021-11-01 NOTE — Progress Notes (Signed)
? ?Subjective:  ? ? Patient ID: Bernard Ramirez, male    DOB: 05-Aug-1931, 88 y.o.   MRN: [id number] ? ?Patient has not been wearing his compression hose at home because "they are too tight".  As result he has +2 edema in both legs distal to his knees.  He denies any chest pain or pleurisy or shortness of breath beyond his baseline.  He denies any orthopnea.  His lungs are clear to auscultation bilaterally.  However his legs are swollen. ?Past Medical History:  ?Diagnosis Date  ? Anemia   ? 04/2012: H&H-10.2/31.5, MCV-77; 06/2012:12/38.9  ? Arthritis   ? Benign prostatic hypertrophy   ? s/p transurethral resection of the prostate  ? Chest pain   ? 2004; with dyspnea, stress nuclear-normal EF + questionable inferior wall ischemia, normal coronary angiography;  2010-negative stress nuclear  ? CVA (cerebral infarction)   ? asymptomatic (seen on CT)  ? Diarrhea   ? h/o Hemoccult-positive stool  ? Dysrhythmia   ? Edema of both legs   ? GERD (gastroesophageal reflux disease)   ? History of blood transfusion   ? History of kidney stones   ? History of urinary tract infection   ? Hypertension   ? Jerking movements of extremities   ? left arm   ? Low back pain   ? Obstructive sleep apnea   ? pt states can not use the CPAP  ? Peripheral neuropathy   ? lower legs and feet bilat   ? Persistent atrial fibrillation ([place]) 2013  ? Asymptomatic; diagnosed in 05/2012  ? Pneumonia   ? Prostate cancer ([place]) 2017  ? stage 4  ? Shortness of breath dyspnea   ? pt states only develops SOB if tries to do something too quickly  ? Upper GI bleed 1985  ? 1985-peptic ulcer disease; initial hemigastrectomy; subsequent Billroth II  ? Urinary hesitancy   ? Vertigo   ? ED evaluation-06/2012  ? ?Past Surgical History:  ?Procedure Laterality Date  ? Billroth II    ? CHOLECYSTECTOMY    ? COLONOSCOPY  Approximately 2000  ? COLONOSCOPY  10/2012  ? Colonoscopy March 2014 at [facility], difficulty exam requiring fluoroscopy and overtube. Patient developed severe  bradycardia again during his procedure like he did [facility]. Multiple polyps removed which were tubular adenomas. Previously tattooed sites at 20 and 30 cm were free of any polypoid change. Left-sided diverticulosis noted.  ? COLONOSCOPY WITH ESOPHAGOGASTRODUODENOSCOPY (EGD)  06-17-2009  ? MVH:QIONGEXB tubular adenomas and 2 tubulovillous adenomas, incomplete, 3 clips placed  ? ESOPHAGOGASTRODUODENOSCOPY  06/17/2009  ? SLF: normal/chronic gastritis (non-h pylori)  ? ESOPHAGOGASTRODUODENOSCOPY N/A 05/11/2015  ? MWU:[id number] anemai due to postgastrectomy state/moderate erosive gastritis  ? INGUINAL HERNIA REPAIR    ? Left  ? [place]  ? Bilateral  ? TRANSURETHRAL RESECTION OF PROSTATE  06/2016  ? TRANSURETHRAL RESECTION OF PROSTATE N/A 12/27/2012  ? Procedure: TRANSURETHRAL RESECTION OF THE PROSTATE (TURP);  Surgeon: Marissa Nestle, MD;  Location: [facility];  Service: Urology;  Laterality: N/A;  ? TRANSURETHRAL RESECTION OF PROSTATE N/A 04/11/2016  ? Procedure: TRANSURETHRAL RESECTION OF THE PROSTATE (TURP);  Surgeon: Irine Seal, MD;  Location: [facility];  Service: Urology;  Laterality: N/A;  ? VAGOTOMY AND PYLOROPLASTY  1970s  ? Details of procedure uncertain; 0272Z  ? ?Current Outpatient Medications on File Prior to Visit  ?Medication Sig Dispense Refill  ? clobetasol (TEMOVATE) 0.05 % external solution Apply 1 application topically 2 (two)  times daily. 50 mL 2  ? clobetasol cream (TEMOVATE) 3.14 % Apply 1 application topically daily as needed (use as directed for skin). 30 g 0  ? clopidogrel (PLAVIX) 75 MG tablet TAKE 1 TABLET(75 MG) BY MOUTH DAILY 30 tablet 0  ? furosemide (LASIX) 40 MG tablet TAKE 1 AND 1/2 TABLETS BY MOUTH EVERY MORNING THEN TAKE 1 TABLET BY MOUTH EVERY AFTERNOON 75 tablet 1  ? HYDROcodone-acetaminophen (NORCO) 10-325 MG tablet Take 1 tablet by mouth every 4 (four) hours as needed (maxium dose 5 tabs PO QD). Chronic Pain. Dx: G89.4. 150 tablet 0  ? losartan (COZAAR) 100 MG  tablet TAKE 1 TABLET(100 MG) BY MOUTH DAILY 90 tablet 0  ? meclizine (ANTIVERT) 25 MG tablet Take one tablet by mouth at bedtime. 30 tablet 3  ? NON FORMULARY Diet:Regular, NAS    ? NYSTATIN powder APPLY TOPICALLY TO THE AFFECTED AREA EVERY DAY AS NEEDED FOR RASH 60 g 3  ? pantoprazole (PROTONIX) 40 MG tablet TAKE 1 TABLET(40 MG) BY MOUTH DAILY 90 tablet 3  ? potassium chloride SA (KLOR-CON) 20 MEQ tablet TAKE 1 TABLET(20 MEQ) BY MOUTH DAILY 30 tablet 0  ? pravastatin (PRAVACHOL) 20 MG tablet TAKE 1 TABLET(20 MG) BY MOUTH DAILY 90 tablet 1  ? silver sulfADIAZINE (SILVADENE) 1 % cream Apply 1 application topically daily. 400 g 0  ? triamcinolone cream (KENALOG) 0.1 % Apply 1 application topically daily as needed (for irritation). 30 g 0  ? ?No current facility-administered medications on file prior to visit.  ? ?Allergies  ?Allergen Reactions  ? Contrast Media [Iodinated Contrast Media]   ?  Pt states he was given "dye" 20 yrs ago, anaphylaxis & syncope - was told to never have it again   ? Gabapentin Itching  ? Aspirin Other (See Comments)  ?  Blistering ?  ? Latex Rash  ? Lipitor [Atorvastatin] Itching  ? Tape Rash  ?  EKG leads  ? ?Social History  ? ?Socioeconomic History  ? Marital status: Married  ?  Spouse name: [name]  ? Number of children: 5  ? Years of education: Not on file  ? Highest education level: Not on file  ?Occupational History  ? 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  Employer: RETIRED  ?Tobacco Use  ? Smoking status: Former  ?  Types: Cigars  ?  Quit date: 08/22/2003  ?  Years since quitting: 18.2  ? Smokeless tobacco: Never  ? Tobacco comments:  ?  Quit many years ago; few cigars per day; didn't inhale  ?Vaping Use  ? Vaping Use: Never used  ?Substance and Sexual Activity  ? Alcohol use: No  ?  Alcohol/week: 1.0 standard drink  ?  Types: 1 Standard drinks or equivalent per week  ? Drug use: No  ? Sexual activity: Not on file  ?Other Topics Concern  ? Not on file  ?Social History Narrative  ? Lives  w/ wife in [place]  ? Retired Administrator  ? ?Social Determinants of Health  ? ?Financial Resource Strain: Low Risk   ? Difficulty of Paying Living Expenses: Not hard at all  ?Food Insecurity: No Food Insecurity  ? Worried About Charity fundraiser in the Last Year: Never true  ? Ran Out of Food in the Last Year: Never true  ?Transportation Needs: No Transportation Needs  ? Lack of Transportation (Medical): No  ? Lack of Transportation (Non-Medical): No  ?Physical Activity: Inactive  ? Days of Exercise per Week:  0 days  ? Minutes of Exercise per Session: 0 min  ?Stress: No Stress Concern Present  ? Feeling of Stress : Not at all  ?Social Connections: Socially Isolated  ? Frequency of Communication with Friends and Family: Never  ? Frequency of Social Gatherings with Friends and Family: Once a week  ? Attends Religious Services: Never  ? Active Member of Clubs or Organizations: No  ? Attends Archivist Meetings: Never  ? Marital Status: Married  ?Intimate Partner Violence: Not At Risk  ? Fear of Current or Ex-Partner: No  ? Emotionally Abused: No  ? Physically Abused: No  ? Sexually Abused: No  ? ? ? ? ?Review of Systems  ?All other systems reviewed and are negative. ? ?   ?Objective:  ? Physical Exam ?Vitals reviewed.  ?Constitutional:   ?   General: He is not in acute distress. ?   Appearance: Normal appearance. He is obese. He is not ill-appearing or toxic-appearing.  ?Cardiovascular:  ?   Rate and Rhythm: Normal rate. Rhythm irregular.  ?   Heart sounds: Normal heart sounds. No murmur heard. ?  No friction rub. No gallop.  ?Pulmonary:  ?   Effort: Pulmonary effort is normal. No respiratory distress.  ?   Breath sounds: Normal breath sounds. No stridor. No wheezing, rhonchi or rales.  ?Abdominal:  ?   General: Abdomen is flat. Bowel sounds are normal. There is no distension.  ?   Palpations: Abdomen is soft. There is no mass.  ?   Tenderness: There is no abdominal tenderness. There is no guarding.   ?Musculoskeletal:  ?   Right lower leg: Edema present.  ?   Left lower leg: Edema present.  ?Skin: ?   Findings: No erythema.  ?Neurological:  ?   General: No focal deficit present.  ?   Mental Status: He i

## 2021-11-03 ENCOUNTER — Telehealth: Payer: Self-pay | Admitting: Pharmacist

## 2021-11-03 DIAGNOSIS — C4442 Squamous cell carcinoma of skin of scalp and neck: Secondary | ICD-10-CM | POA: Diagnosis not present

## 2021-11-03 NOTE — Progress Notes (Signed)
? ? ?Chronic Care Management ?Pharmacy Assistant  ? ?Name: RAYMEL CULL  MRN: [id number] DOB: 1931-06-25 ? ? ?Reason for Encounter: Disease State - Hypertension Call  ?  ? ?Recent office visits:  ?11/01/21 Jenna Luo, MD - Family Medicine - Systolic Congestive heart failure - Bilateral una boots applied in office. Lasix increased to (3) 40 mg tablets daily. Follow up later in the week as scheduled.  ? ?10/17/21 Jenna Luo, MD - Family Medicine - Neck Mass - Referral to dermatology placed. Follow up as needed.  ? ? ?Recent consult visits:  ?None noted.  ? ? ?Hospital visits: 06/16/21 ?Medication Reconciliation was completed by comparing discharge summary, patient?s EMR and Pharmacy list, and upon discussion with patient. ?  ?Admitted to the hospital on 06/16/21 due to Fall. Discharge date was 06/16/21. Discharged from [facility].   ?  ?New?Medications Started at [facility] Discharge:?? ?None noted ?  ?Medication Changes at Hospital Discharge: ?None noted ?  ?Medications Discontinued at Hospital Discharge: ?None noted ?  ?Medications that remain the same after Hospital Discharge:??  ?All other medications will remain the same.   ? ? ?Medications: ?Outpatient Encounter Medications as of 11/03/2021  ?Medication Sig  ? clobetasol (TEMOVATE) 0.05 % external solution Apply 1 application topically 2 (two) times daily.  ? clobetasol cream (TEMOVATE) 4.40 % Apply 1 application topically daily as needed (use as directed for skin).  ? clopidogrel (PLAVIX) 75 MG tablet TAKE 1 TABLET(75 MG) BY MOUTH DAILY  ? furosemide (LASIX) 40 MG tablet TAKE 1 AND 1/2 TABLETS BY MOUTH EVERY MORNING THEN TAKE 1 TABLET BY MOUTH EVERY AFTERNOON  ? HYDROcodone-acetaminophen (NORCO) 10-325 MG tablet Take 1 tablet by mouth every 4 (four) hours as needed (maxium dose 5 tabs PO QD). Chronic Pain. Dx: G89.4.  ? losartan (COZAAR) 100 MG tablet TAKE 1 TABLET(100 MG) BY MOUTH DAILY  ? meclizine (ANTIVERT) 25 MG tablet Take one tablet by  mouth at bedtime.  ? NON FORMULARY Diet:Regular, NAS  ? NYSTATIN powder APPLY TOPICALLY TO THE AFFECTED AREA EVERY DAY AS NEEDED FOR RASH  ? pantoprazole (PROTONIX) 40 MG tablet TAKE 1 TABLET(40 MG) BY MOUTH DAILY  ? potassium chloride SA (KLOR-CON) 20 MEQ tablet TAKE 1 TABLET(20 MEQ) BY MOUTH DAILY  ? pravastatin (PRAVACHOL) 20 MG tablet TAKE 1 TABLET(20 MG) BY MOUTH DAILY  ? silver sulfADIAZINE (SILVADENE) 1 % cream Apply 1 application topically daily.  ? triamcinolone cream (KENALOG) 0.1 % Apply 1 application topically daily as needed (for irritation).  ? ?No facility-administered encounter medications on file as of 11/03/2021.  ? ? ?Current antihypertensive regimen:  ?Losartan 100 mg 1 tablet by mouth daily. ? ?How often are you checking your Blood Pressure?  ?Patient reported checking blood pressures ? ? ?Current home BP readings:  ? ? ?What recent interventions/DTPs have been made by any provider to improve Blood Pressure control since last CPP Visit:  ?Patient  ? ? ?Any recent hospitalizations or ED visits since last visit with CPP?  ?Patient had an ED visit on 06/16/21 for a Fall.  ? ? ?What diet changes have been made to improve Blood Pressure Control?  ? ? ? ?What exercise is being done to improve your Blood Pressure Control?  ? ? ? ? ?Adherence Review: ?Is the patient currently on ACE/ARB medication? Yes ?Does the patient have >5 day gap between last estimated fill dates? No ? ? ?Care Gaps ?  ?AWV: done 05/05/21 ?Colonoscopy: upper EGD done 05/12/15 ?DM Eye Exam:  N/A ?DM Foot Exam: N/A ?Microalbumin: unknown ?HbgAIC: done 06/14/21 (6.5) ?DEXA: N/A ?Mammogram: N/A ? ?  ?Star Rating Drugs: ?pravastatin (PRAVACHOL) 20 MG tablet - last filled 10/09/21 90 days  ?losartan (COZAAR) 100 MG tablet - last filled 10/24/21 90 days  ? ? ? ?Future Appointments  ?Date Time Provider [place]  ?11/04/2021 12:30 PM Pickard, Cammie Mcgee, MD [facility]  ?12/15/2021 12:15 PM Dennard Schaumann Cammie Mcgee, MD [facility]  ?05/11/2022   2:00 PM [facility] ADVIS[facility]  ? ?Multiple attempts were made to contact patient. Attempts were unsuccessful. / ls,CMA  ? ?Liza Showfety, CCMA ?Clinical Pharmacist Assistant  ?([phone] ? ? ?

## 2021-11-04 ENCOUNTER — Ambulatory Visit (INDEPENDENT_AMBULATORY_CARE_PROVIDER_SITE_OTHER): Payer: PPO | Admitting: Family Medicine

## 2021-11-04 ENCOUNTER — Other Ambulatory Visit: Payer: Self-pay

## 2021-11-04 ENCOUNTER — Encounter: Payer: Self-pay | Admitting: Family Medicine

## 2021-11-04 VITALS — BP 118/68 | HR 80 | Temp 96.8°F | Resp 18 | Ht 72.0 in | Wt 230.0 lb

## 2021-11-04 DIAGNOSIS — I5020 Unspecified systolic (congestive) heart failure: Secondary | ICD-10-CM | POA: Diagnosis not present

## 2021-11-04 NOTE — Progress Notes (Signed)
? ?Subjective:  ? ? Patient ID: Bernard Ramirez, male    DOB: 31-Oct-1930, 88 y.o.   MRN: [id number] ?11/01/21 ?Patient has not been wearing his compression hose at home because "they are too tight".  As result he has +2 edema in both legs distal to his knees.  He denies any chest pain or pleurisy or shortness of breath beyond his baseline.  He denies any orthopnea.  His lungs are clear to auscultation bilaterally.  However his legs are swollen.  At that time, my plan was: ? ?Patient does not appear to be fluid overloaded however he is definitely third spacing with significant peripheral edema in his lower legs due to his immobility and his noncompliance with compression hose.  Therefore I placed the patient in bilateral Unna boots today.  I recommended that he increase his Lasix from 2, 40 mg tabs a day to 3, 40 mg tabs a day and recheck on Friday.  At that time a plan transition the patient back to his compression stockings.  I stressed his need for compliance wearing compression hose to prevent this in the future.  Thankfully, I believe that we caught this before he developed any venous stasis ulcers. ?11/04/21 ?Swelling in the patient's legs have improved dramatically.  The edema is essentially resolved in the area with compression wraps in place.  However he forgot to bring his compression hose today.  He is actually asking me to rewrap his legs rather than making wear the compression hose because he feels that the wraps are more comfortable.  There is no rash or venous stasis ulcer today. ?Past Medical History:  ?Diagnosis Date  ? Anemia   ? 04/2012: H&H-10.2/31.5, MCV-77; 06/2012:12/38.9  ? Arthritis   ? Benign prostatic hypertrophy   ? s/p transurethral resection of the prostate  ? Chest pain   ? 2004; with dyspnea, stress nuclear-normal EF + questionable inferior wall ischemia, normal coronary angiography;  2010-negative stress nuclear  ? CVA (cerebral infarction)   ? asymptomatic (seen on CT)  ? Diarrhea   ?  h/o Hemoccult-positive stool  ? Dysrhythmia   ? Edema of both legs   ? GERD (gastroesophageal reflux disease)   ? History of blood transfusion   ? History of kidney stones   ? History of urinary tract infection   ? Hypertension   ? Jerking movements of extremities   ? left arm   ? Low back pain   ? Obstructive sleep apnea   ? pt states can not use the CPAP  ? Peripheral neuropathy   ? lower legs and feet bilat   ? Persistent atrial fibrillation ([place]) 2013  ? Asymptomatic; diagnosed in 05/2012  ? Pneumonia   ? Prostate cancer ([place]) 2017  ? stage 4  ? Shortness of breath dyspnea   ? pt states only develops SOB if tries to do something too quickly  ? Upper GI bleed 1985  ? 1985-peptic ulcer disease; initial hemigastrectomy; subsequent Billroth II  ? Urinary hesitancy   ? Vertigo   ? ED evaluation-06/2012  ? ?Past Surgical History:  ?Procedure Laterality Date  ? Billroth II    ? CHOLECYSTECTOMY    ? COLONOSCOPY  Approximately 2000  ? COLONOSCOPY  10/2012  ? Colonoscopy March 2014 at [facility], difficulty exam requiring fluoroscopy and overtube. Patient developed severe bradycardia again during his procedure like he did [facility]. Multiple polyps removed which were tubular adenomas. Previously tattooed sites at 20 and 30 cm were free  of any polypoid change. Left-sided diverticulosis noted.  ? COLONOSCOPY WITH ESOPHAGOGASTRODUODENOSCOPY (EGD)  06-17-2009  ? JME:[id number] tubular adenomas and 2 tubulovillous adenomas, incomplete, 3 clips placed  ? ESOPHAGOGASTRODUODENOSCOPY  06/17/2009  ? SLF: normal/chronic gastritis (non-h pylori)  ? ESOPHAGOGASTRODUODENOSCOPY N/A 05/11/2015  ? [id number] anemai due to postgastrectomy state/moderate erosive gastritis  ? INGUINAL HERNIA REPAIR    ? Left  ? [place]  ? Bilateral  ? TRANSURETHRAL RESECTION OF PROSTATE  06/2016  ? TRANSURETHRAL RESECTION OF PROSTATE N/A 12/27/2012  ? Procedure: TRANSURETHRAL RESECTION OF THE PROSTATE (TURP);  Surgeon: Marissa Nestle, MD;   Location: [facility];  Service: Urology;  Laterality: N/A;  ? TRANSURETHRAL RESECTION OF PROSTATE N/A 04/11/2016  ? Procedure: TRANSURETHRAL RESECTION OF THE PROSTATE (TURP);  Surgeon: Irine Seal, MD;  Location: [facility];  Service: Urology;  Laterality: N/A;  ? VAGOTOMY AND PYLOROPLASTY  1970s  ? Details of procedure uncertain; 1941D  ? ?Current Outpatient Medications on File Prior to Visit  ?Medication Sig Dispense Refill  ? clobetasol (TEMOVATE) 0.05 % external solution Apply 1 application topically 2 (two) times daily. 50 mL 2  ? clobetasol cream (TEMOVATE) 4.08 % Apply 1 application topically daily as needed (use as directed for skin). 30 g 0  ? clopidogrel (PLAVIX) 75 MG tablet TAKE 1 TABLET(75 MG) BY MOUTH DAILY 30 tablet 0  ? furosemide (LASIX) 40 MG tablet TAKE 1 AND 1/2 TABLETS BY MOUTH EVERY MORNING THEN TAKE 1 TABLET BY MOUTH EVERY AFTERNOON 75 tablet 1  ? HYDROcodone-acetaminophen (NORCO) 10-325 MG tablet Take 1 tablet by mouth every 4 (four) hours as needed (maxium dose 5 tabs PO QD). Chronic Pain. Dx: G89.4. 150 tablet 0  ? losartan (COZAAR) 100 MG tablet TAKE 1 TABLET(100 MG) BY MOUTH DAILY 90 tablet 0  ? meclizine (ANTIVERT) 25 MG tablet Take one tablet by mouth at bedtime. 30 tablet 3  ? NON FORMULARY Diet:Regular, NAS    ? NYSTATIN powder APPLY TOPICALLY TO THE AFFECTED AREA EVERY DAY AS NEEDED FOR RASH 60 g 3  ? pantoprazole (PROTONIX) 40 MG tablet TAKE 1 TABLET(40 MG) BY MOUTH DAILY 90 tablet 3  ? potassium chloride SA (KLOR-CON) 20 MEQ tablet TAKE 1 TABLET(20 MEQ) BY MOUTH DAILY 30 tablet 0  ? pravastatin (PRAVACHOL) 20 MG tablet TAKE 1 TABLET(20 MG) BY MOUTH DAILY 90 tablet 1  ? silver sulfADIAZINE (SILVADENE) 1 % cream Apply 1 application topically daily. 400 g 0  ? triamcinolone cream (KENALOG) 0.1 % Apply 1 application topically daily as needed (for irritation). 30 g 0  ? ?No current facility-administered medications on file prior to visit.  ? ?Allergies  ?Allergen Reactions  ? Contrast Media  [Iodinated Contrast Media]   ?  Pt states he was given "dye" 20 yrs ago, anaphylaxis & syncope - was told to never have it again   ? Gabapentin Itching  ? Aspirin Other (See Comments)  ?  Blistering ?  ? Latex Rash  ? Lipitor [Atorvastatin] Itching  ? Tape Rash  ?  EKG leads  ? ?Social History  ? ?Socioeconomic History  ? Marital status: Married  ?  Spouse name: [name]  ? Number of children: 5  ? Years of education: Not on file  ? Highest education level: Not on file  ?Occupational History  ? 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  Employer: RETIRED  ?Tobacco Use  ? Smoking status: Former  ?  Types: Cigars  ?  Quit date: 08/22/2003  ?  Years since quitting: 18.2  ? Smokeless tobacco: Never  ? Tobacco comments:  ?  Quit many years ago; few cigars per day; didn't inhale  ?Vaping Use  ? Vaping Use: Never used  ?Substance and Sexual Activity  ? Alcohol use: No  ?  Alcohol/week: 1.0 standard drink  ?  Types: 1 Standard drinks or equivalent per week  ? Drug use: No  ? Sexual activity: Not on file  ?Other Topics Concern  ? Not on file  ?Social History Narrative  ? Lives w/ wife in [place]  ? Retired Administrator  ? ?Social Determinants of Health  ? ?Financial Resource Strain: Low Risk   ? Difficulty of Paying Living Expenses: Not hard at all  ?Food Insecurity: No Food Insecurity  ? Worried About Charity fundraiser in the Last Year: Never true  ? Ran Out of Food in the Last Year: Never true  ?Transportation Needs: No Transportation Needs  ? Lack of Transportation (Medical): No  ? Lack of Transportation (Non-Medical): No  ?Physical Activity: Inactive  ? Days of Exercise per Week: 0 days  ? Minutes of Exercise per Session: 0 min  ?Stress: No Stress Concern Present  ? Feeling of Stress : Not at all  ?Social Connections: Socially Isolated  ? Frequency of Communication with Friends and Family: Never  ? Frequency of Social Gatherings with Friends and Family: Once a week  ? Attends Religious Services: Never  ? Active Member of  Clubs or Organizations: No  ? Attends Archivist Meetings: Never  ? Marital Status: Married  ?Intimate Partner Violence: Not At Risk  ? Fear of Current or Ex-Partner: No  ? Emotionally Abused: No  ?

## 2021-11-07 ENCOUNTER — Other Ambulatory Visit: Payer: Self-pay

## 2021-11-07 ENCOUNTER — Ambulatory Visit (INDEPENDENT_AMBULATORY_CARE_PROVIDER_SITE_OTHER): Payer: PPO | Admitting: Family Medicine

## 2021-11-07 VITALS — BP 115/60 | HR 81 | Temp 98.0°F | Ht 72.0 in | Wt 232.0 lb

## 2021-11-07 DIAGNOSIS — I5020 Unspecified systolic (congestive) heart failure: Secondary | ICD-10-CM

## 2021-11-07 NOTE — Progress Notes (Signed)
? ?Subjective:  ? ? Patient ID: Bernard Ramirez, male    DOB: 1930-09-01, 88 y.o.   MRN: [id number] ?11/01/21 ?Patient has not been wearing his compression hose at home because "they are too tight".  As result he has +2 edema in both legs distal to his knees.  He denies any chest pain or pleurisy or shortness of breath beyond his baseline.  He denies any orthopnea.  His lungs are clear to auscultation bilaterally.  However his legs are swollen.  At that time, my plan was: ? ?Patient does not appear to be fluid overloaded however he is definitely third spacing with significant peripheral edema in his lower legs due to his immobility and his noncompliance with compression hose.  Therefore I placed the patient in bilateral Unna boots today.  I recommended that he increase his Lasix from 2, 40 mg tabs a day to 3, 40 mg tabs a day and recheck on Friday.  At that time a plan transition the patient back to his compression stockings.  I stressed his need for compliance wearing compression hose to prevent this in the future.  Thankfully, I believe that we caught this before he developed any venous stasis ulcers. ?11/04/21 ?Swelling in the patient's legs have improved dramatically.  The edema is essentially resolved in the area with compression wraps in place.  However he forgot to bring his compression hose today.  He is actually asking me to rewrap his legs rather than making wear the compression hose because he feels that the wraps are more comfortable.  There is no rash or venous stasis ulcer today.  At that time, my plan was: ? ?There is no evidence of fluid overload on today's exam.  I placed the patient back in Unna boots bilaterally.  I asked him to return next week so that we can remove the Unna boots and placed the patient back in his compression hose.  I encouraged future compliance with compression hose.  Resume previous dose of Lasix. ? ?11/07/21 ?Patient's legs look much better today.  There is no swelling.  There  is no erythema.  His Unna boots have been removed.  He is here today to have his compression hose placed on as the patient and his wife are unable to put the socks on his legs. ?Past Medical History:  ?Diagnosis Date  ? Anemia   ? 04/2012: H&H-10.2/31.5, MCV-77; 06/2012:12/38.9  ? Arthritis   ? Benign prostatic hypertrophy   ? s/p transurethral resection of the prostate  ? Chest pain   ? 2004; with dyspnea, stress nuclear-normal EF + questionable inferior wall ischemia, normal coronary angiography;  2010-negative stress nuclear  ? CVA (cerebral infarction)   ? asymptomatic (seen on CT)  ? Diarrhea   ? h/o Hemoccult-positive stool  ? Dysrhythmia   ? Edema of both legs   ? GERD (gastroesophageal reflux disease)   ? History of blood transfusion   ? History of kidney stones   ? History of urinary tract infection   ? Hypertension   ? Jerking movements of extremities   ? left arm   ? Low back pain   ? Obstructive sleep apnea   ? pt states can not use the CPAP  ? Peripheral neuropathy   ? lower legs and feet bilat   ? Persistent atrial fibrillation ([place]) 2013  ? Asymptomatic; diagnosed in 05/2012  ? Pneumonia   ? Prostate cancer ([place]) 2017  ? stage 4  ? Shortness of breath dyspnea   ?  pt states only develops SOB if tries to do something too quickly  ? Upper GI bleed 1985  ? 1985-peptic ulcer disease; initial hemigastrectomy; subsequent Billroth II  ? Urinary hesitancy   ? Vertigo   ? ED evaluation-06/2012  ? ?Past Surgical History:  ?Procedure Laterality Date  ? Billroth II    ? CHOLECYSTECTOMY    ? COLONOSCOPY  Approximately 2000  ? COLONOSCOPY  10/2012  ? Colonoscopy March 2014 at [facility], difficulty exam requiring fluoroscopy and overtube. Patient developed severe bradycardia again during his procedure like he did [facility]. Multiple polyps removed which were tubular adenomas. Previously tattooed sites at 20 and 30 cm were free of any polypoid change. Left-sided diverticulosis noted.  ? COLONOSCOPY WITH  ESOPHAGOGASTRODUODENOSCOPY (EGD)  06-17-2009  ? GDJ:[id number] tubular adenomas and 2 tubulovillous adenomas, incomplete, 3 clips placed  ? ESOPHAGOGASTRODUODENOSCOPY  06/17/2009  ? SLF: normal/chronic gastritis (non-h pylori)  ? ESOPHAGOGASTRODUODENOSCOPY N/A 05/11/2015  ? [id number]:[id number] anemai due to postgastrectomy state/moderate erosive gastritis  ? INGUINAL HERNIA REPAIR    ? Left  ? [place]  ? Bilateral  ? TRANSURETHRAL RESECTION OF PROSTATE  06/2016  ? TRANSURETHRAL RESECTION OF PROSTATE N/A 12/27/2012  ? Procedure: TRANSURETHRAL RESECTION OF THE PROSTATE (TURP);  Surgeon: Marissa Nestle, MD;  Location: [facility];  Service: Urology;  Laterality: N/A;  ? TRANSURETHRAL RESECTION OF PROSTATE N/A 04/11/2016  ? Procedure: TRANSURETHRAL RESECTION OF THE PROSTATE (TURP);  Surgeon: Irine Seal, MD;  Location: [facility];  Service: Urology;  Laterality: N/A;  ? VAGOTOMY AND PYLOROPLASTY  1970s  ? Details of procedure uncertain; 2119E  ? ?Current Outpatient Medications on File Prior to Visit  ?Medication Sig Dispense Refill  ? clobetasol (TEMOVATE) 0.05 % external solution Apply 1 application topically 2 (two) times daily. 50 mL 2  ? clobetasol cream (TEMOVATE) 1.74 % Apply 1 application topically daily as needed (use as directed for skin). 30 g 0  ? clopidogrel (PLAVIX) 75 MG tablet TAKE 1 TABLET(75 MG) BY MOUTH DAILY 30 tablet 0  ? furosemide (LASIX) 40 MG tablet TAKE 1 AND 1/2 TABLETS BY MOUTH EVERY MORNING THEN TAKE 1 TABLET BY MOUTH EVERY AFTERNOON 75 tablet 1  ? HYDROcodone-acetaminophen (NORCO) 10-325 MG tablet Take 1 tablet by mouth every 4 (four) hours as needed (maxium dose 5 tabs PO QD). Chronic Pain. Dx: G89.4. 150 tablet 0  ? losartan (COZAAR) 100 MG tablet TAKE 1 TABLET(100 MG) BY MOUTH DAILY 90 tablet 0  ? meclizine (ANTIVERT) 25 MG tablet Take one tablet by mouth at bedtime. 30 tablet 3  ? NON FORMULARY Diet:Regular, NAS    ? NYSTATIN powder APPLY TOPICALLY TO THE AFFECTED AREA EVERY DAY AS NEEDED  FOR RASH 60 g 3  ? pantoprazole (PROTONIX) 40 MG tablet TAKE 1 TABLET(40 MG) BY MOUTH DAILY 90 tablet 3  ? potassium chloride SA (KLOR-CON) 20 MEQ tablet TAKE 1 TABLET(20 MEQ) BY MOUTH DAILY 30 tablet 0  ? pravastatin (PRAVACHOL) 20 MG tablet TAKE 1 TABLET(20 MG) BY MOUTH DAILY 90 tablet 1  ? silver sulfADIAZINE (SILVADENE) 1 % cream Apply 1 application topically daily. 400 g 0  ? triamcinolone cream (KENALOG) 0.1 % Apply 1 application topically daily as needed (for irritation). 30 g 0  ? ?No current facility-administered medications on file prior to visit.  ? ?Allergies  ?Allergen Reactions  ? Contrast Media [Iodinated Contrast Media]   ?  Pt states he was given "dye" 20 yrs ago, anaphylaxis & syncope -  was told to never have it again   ? Gabapentin Itching  ? Aspirin Other (See Comments)  ?  Blistering ?  ? Latex Rash  ? Lipitor [Atorvastatin] Itching  ? Tape Rash  ?  EKG leads  ? ?Social History  ? ?Socioeconomic History  ? Marital status: Married  ?  Spouse name: [name]  ? Number of children: 5  ? Years of education: Not on file  ? Highest education level: Not on file  ?Occupational History  ? 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  Employer: RETIRED  ?Tobacco Use  ? Smoking status: Former  ?  Types: Cigars  ?  Quit date: 08/22/2003  ?  Years since quitting: 18.2  ? Smokeless tobacco: Never  ? Tobacco comments:  ?  Quit many years ago; few cigars per day; didn't inhale  ?Vaping Use  ? Vaping Use: Never used  ?Substance and Sexual Activity  ? Alcohol use: No  ?  Alcohol/week: 1.0 standard drink  ?  Types: 1 Standard drinks or equivalent per week  ? Drug use: No  ? Sexual activity: Not on file  ?Other Topics Concern  ? Not on file  ?Social History Narrative  ? Lives w/ wife in [place]  ? Retired Administrator  ? ?Social Determinants of Health  ? ?Financial Resource Strain: Low Risk   ? Difficulty of Paying Living Expenses: Not hard at all  ?Food Insecurity: No Food Insecurity  ? Worried About Charity fundraiser in  the Last Year: Never true  ? Ran Out of Food in the Last Year: Never true  ?Transportation Needs: No Transportation Needs  ? Lack of Transportation (Medical): No  ? Lack of Transportation (Non-Medical): No  ?Physi

## 2021-11-22 ENCOUNTER — Inpatient Hospital Stay (HOSPITAL_COMMUNITY)
Admission: EM | Admit: 2021-11-22 | Discharge: 2021-11-28 | DRG: 064 | Disposition: A | Discharge status: Skilled Nursing Facility | Payer: PPO | Attending: Internal Medicine | Admitting: Internal Medicine

## 2021-11-22 ENCOUNTER — Emergency Department (HOSPITAL_COMMUNITY): Payer: PPO

## 2021-11-22 ENCOUNTER — Other Ambulatory Visit: Payer: Self-pay

## 2021-11-22 ENCOUNTER — Encounter (HOSPITAL_COMMUNITY): Payer: Self-pay

## 2021-11-22 DIAGNOSIS — R531 Weakness: Secondary | ICD-10-CM | POA: Diagnosis present

## 2021-11-22 DIAGNOSIS — Z91041 Radiographic dye allergy status: Secondary | ICD-10-CM

## 2021-11-22 DIAGNOSIS — N179 Acute kidney failure, unspecified: Secondary | ICD-10-CM | POA: Diagnosis not present

## 2021-11-22 DIAGNOSIS — I10 Essential (primary) hypertension: Secondary | ICD-10-CM | POA: Diagnosis present

## 2021-11-22 DIAGNOSIS — R29810 Facial weakness: Secondary | ICD-10-CM | POA: Diagnosis present

## 2021-11-22 DIAGNOSIS — E785 Hyperlipidemia, unspecified: Secondary | ICD-10-CM | POA: Diagnosis present

## 2021-11-22 DIAGNOSIS — Z7902 Long term (current) use of antithrombotics/antiplatelets: Secondary | ICD-10-CM

## 2021-11-22 DIAGNOSIS — R29703 NIHSS score 3: Secondary | ICD-10-CM | POA: Diagnosis not present

## 2021-11-22 DIAGNOSIS — I639 Cerebral infarction, unspecified: Secondary | ICD-10-CM | POA: Diagnosis not present

## 2021-11-22 DIAGNOSIS — Z8546 Personal history of malignant neoplasm of prostate: Secondary | ICD-10-CM

## 2021-11-22 DIAGNOSIS — R61 Generalized hyperhidrosis: Secondary | ICD-10-CM | POA: Diagnosis not present

## 2021-11-22 DIAGNOSIS — I959 Hypotension, unspecified: Secondary | ICD-10-CM | POA: Diagnosis not present

## 2021-11-22 DIAGNOSIS — N400 Enlarged prostate without lower urinary tract symptoms: Secondary | ICD-10-CM | POA: Diagnosis present

## 2021-11-22 DIAGNOSIS — I4821 Permanent atrial fibrillation: Secondary | ICD-10-CM | POA: Diagnosis not present

## 2021-11-22 DIAGNOSIS — R42 Dizziness and giddiness: Secondary | ICD-10-CM | POA: Diagnosis not present

## 2021-11-22 DIAGNOSIS — G4733 Obstructive sleep apnea (adult) (pediatric): Secondary | ICD-10-CM | POA: Diagnosis present

## 2021-11-22 DIAGNOSIS — I63541 Cerebral infarction due to unspecified occlusion or stenosis of right cerebellar artery: Principal | ICD-10-CM | POA: Diagnosis present

## 2021-11-22 DIAGNOSIS — Z7189 Other specified counseling: Secondary | ICD-10-CM | POA: Diagnosis not present

## 2021-11-22 DIAGNOSIS — R270 Ataxia, unspecified: Secondary | ICD-10-CM | POA: Diagnosis not present

## 2021-11-22 DIAGNOSIS — E782 Mixed hyperlipidemia: Secondary | ICD-10-CM | POA: Diagnosis present

## 2021-11-22 DIAGNOSIS — G9341 Metabolic encephalopathy: Secondary | ICD-10-CM | POA: Diagnosis not present

## 2021-11-22 DIAGNOSIS — I700 Atherosclerosis of aorta: Secondary | ICD-10-CM | POA: Diagnosis not present

## 2021-11-22 DIAGNOSIS — I517 Cardiomegaly: Secondary | ICD-10-CM | POA: Diagnosis not present

## 2021-11-22 DIAGNOSIS — R001 Bradycardia, unspecified: Secondary | ICD-10-CM | POA: Diagnosis not present

## 2021-11-22 DIAGNOSIS — Z20822 Contact with and (suspected) exposure to covid-19: Secondary | ICD-10-CM | POA: Diagnosis not present

## 2021-11-22 DIAGNOSIS — I6389 Other cerebral infarction: Secondary | ICD-10-CM | POA: Diagnosis not present

## 2021-11-22 DIAGNOSIS — Z888 Allergy status to other drugs, medicaments and biological substances status: Secondary | ICD-10-CM

## 2021-11-22 DIAGNOSIS — G459 Transient cerebral ischemic attack, unspecified: Secondary | ICD-10-CM | POA: Diagnosis not present

## 2021-11-22 DIAGNOSIS — Z833 Family history of diabetes mellitus: Secondary | ICD-10-CM

## 2021-11-22 DIAGNOSIS — E860 Dehydration: Secondary | ICD-10-CM | POA: Diagnosis not present

## 2021-11-22 DIAGNOSIS — Z515 Encounter for palliative care: Secondary | ICD-10-CM | POA: Diagnosis not present

## 2021-11-22 DIAGNOSIS — X31XXXA Exposure to excessive natural cold, initial encounter: Secondary | ICD-10-CM

## 2021-11-22 DIAGNOSIS — Z66 Do not resuscitate: Secondary | ICD-10-CM | POA: Diagnosis not present

## 2021-11-22 DIAGNOSIS — R296 Repeated falls: Secondary | ICD-10-CM | POA: Diagnosis present

## 2021-11-22 DIAGNOSIS — R0602 Shortness of breath: Secondary | ICD-10-CM | POA: Diagnosis not present

## 2021-11-22 DIAGNOSIS — C7951 Secondary malignant neoplasm of bone: Secondary | ICD-10-CM | POA: Diagnosis not present

## 2021-11-22 DIAGNOSIS — T68XXXA Hypothermia, initial encounter: Secondary | ICD-10-CM | POA: Diagnosis present

## 2021-11-22 DIAGNOSIS — Z8673 Personal history of transient ischemic attack (TIA), and cerebral infarction without residual deficits: Secondary | ICD-10-CM

## 2021-11-22 DIAGNOSIS — R112 Nausea with vomiting, unspecified: Secondary | ICD-10-CM | POA: Diagnosis present

## 2021-11-22 DIAGNOSIS — I4819 Other persistent atrial fibrillation: Secondary | ICD-10-CM | POA: Diagnosis not present

## 2021-11-22 DIAGNOSIS — N401 Enlarged prostate with lower urinary tract symptoms: Secondary | ICD-10-CM | POA: Diagnosis not present

## 2021-11-22 DIAGNOSIS — Z7401 Bed confinement status: Secondary | ICD-10-CM | POA: Diagnosis not present

## 2021-11-22 DIAGNOSIS — R0689 Other abnormalities of breathing: Secondary | ICD-10-CM | POA: Diagnosis not present

## 2021-11-22 DIAGNOSIS — Z91048 Other nonmedicinal substance allergy status: Secondary | ICD-10-CM

## 2021-11-22 DIAGNOSIS — Z886 Allergy status to analgesic agent status: Secondary | ICD-10-CM

## 2021-11-22 DIAGNOSIS — Z87891 Personal history of nicotine dependence: Secondary | ICD-10-CM | POA: Diagnosis not present

## 2021-11-22 DIAGNOSIS — K219 Gastro-esophageal reflux disease without esophagitis: Secondary | ICD-10-CM | POA: Diagnosis not present

## 2021-11-22 DIAGNOSIS — I480 Paroxysmal atrial fibrillation: Secondary | ICD-10-CM | POA: Diagnosis not present

## 2021-11-22 DIAGNOSIS — Z91040 Latex allergy status: Secondary | ICD-10-CM

## 2021-11-22 DIAGNOSIS — G629 Polyneuropathy, unspecified: Secondary | ICD-10-CM | POA: Diagnosis not present

## 2021-11-22 DIAGNOSIS — R680 Hypothermia, not associated with low environmental temperature: Secondary | ICD-10-CM | POA: Diagnosis not present

## 2021-11-22 LAB — COMPREHENSIVE METABOLIC PANEL
ALT: 12 U/L (ref 0–44)
AST: 25 U/L (ref 15–41)
Albumin: 4 g/dL (ref 3.5–5.0)
Alkaline Phosphatase: 91 U/L (ref 38–126)
Anion gap: 11 (ref 5–15)
BUN: 23 mg/dL (ref 8–23)
CO2: 26 mmol/L (ref 22–32)
Calcium: 8.9 mg/dL (ref 8.9–10.3)
Chloride: 102 mmol/L (ref 98–111)
Creatinine, Ser: 1.28 mg/dL — ABNORMAL HIGH (ref 0.61–1.24)
GFR, Estimated: 54 mL/min — ABNORMAL LOW (ref >60)
Glucose, Bld: 204 mg/dL — ABNORMAL HIGH (ref 70–99)
Potassium: 3.8 mmol/L (ref 3.5–5.1)
Sodium: 139 mmol/L (ref 135–145)
Total Bilirubin: 1.5 mg/dL — ABNORMAL HIGH (ref 0.3–1.2)
Total Protein: 7.2 g/dL (ref 6.5–8.1)

## 2021-11-22 LAB — APTT: aPTT: 23 seconds — ABNORMAL LOW (ref 24–36)

## 2021-11-22 LAB — CBC WITH DIFFERENTIAL/PLATELET
Abs Immature Granulocytes: 0.02 10*3/uL (ref 0.00–0.07)
Basophils Absolute: 0 10*3/uL (ref 0.0–0.1)
Basophils Relative: 0 %
Eosinophils Absolute: 0.2 10*3/uL (ref 0.0–0.5)
Eosinophils Relative: 3 %
HCT: 38.5 % — ABNORMAL LOW (ref 39.0–52.0)
Hemoglobin: 11.4 g/dL — ABNORMAL LOW (ref 13.0–17.0)
Immature Granulocytes: 0 %
Lymphocytes Relative: 18 %
Lymphs Abs: 1.5 10*3/uL (ref 0.7–4.0)
MCH: 24.8 pg — ABNORMAL LOW (ref 26.0–34.0)
MCHC: 29.6 g/dL — ABNORMAL LOW (ref 30.0–36.0)
MCV: 83.7 fL (ref 80.0–100.0)
Monocytes Absolute: 0.5 10*3/uL (ref 0.1–1.0)
Monocytes Relative: 6 %
Neutro Abs: 5.8 10*3/uL (ref 1.7–7.7)
Neutrophils Relative %: 73 %
Platelets: 203 10*3/uL (ref 150–400)
RBC: 4.6 MIL/uL (ref 4.22–5.81)
RDW: 17.3 % — ABNORMAL HIGH (ref 11.5–15.5)
WBC: 8 10*3/uL (ref 4.0–10.5)
nRBC: 0 % (ref 0.0–0.2)

## 2021-11-22 LAB — URINALYSIS, ROUTINE W REFLEX MICROSCOPIC
Bilirubin Urine: NEGATIVE
Glucose, UA: NEGATIVE mg/dL
Hgb urine dipstick: NEGATIVE
Ketones, ur: NEGATIVE mg/dL
Leukocytes,Ua: NEGATIVE
Nitrite: NEGATIVE
Protein, ur: NEGATIVE mg/dL
Specific Gravity, Urine: 1.015 (ref 1.005–1.030)
pH: 5 (ref 5.0–8.0)

## 2021-11-22 LAB — PROTIME-INR
INR: 1.1 (ref 0.8–1.2)
Prothrombin Time: 14.2 seconds (ref 11.4–15.2)

## 2021-11-22 LAB — TROPONIN I (HIGH SENSITIVITY)
Troponin I (High Sensitivity): 14 ng/L (ref <18)
Troponin I (High Sensitivity): 13 ng/L (ref <18)

## 2021-11-22 LAB — CBG MONITORING, ED: Glucose-Capillary: 200 mg/dL — ABNORMAL HIGH (ref 70–99)

## 2021-11-22 LAB — LACTIC ACID, PLASMA
Lactic Acid, Venous: 1.5 mmol/L (ref 0.5–1.9)
Lactic Acid, Venous: 1.2 mmol/L (ref 0.5–1.9)

## 2021-11-22 LAB — RESP PANEL BY RT-PCR (FLU A&B, COVID) ARPGX2
Influenza A by PCR: NEGATIVE
Influenza B by PCR: NEGATIVE
SARS Coronavirus 2 by RT PCR: NEGATIVE

## 2021-11-22 MED ORDER — SENNOSIDES-DOCUSATE SODIUM 8.6-50 MG PO TABS
1.0000 | ORAL_TABLET | Freq: Every evening | ORAL | Status: DC | PRN
  Administered 2021-11-26: 1 via ORAL
  Filled 2021-11-22: qty 1

## 2021-11-22 MED ORDER — ONDANSETRON HCL 4 MG/2ML IJ SOLN
4.0000 mg | Freq: Once | INTRAMUSCULAR | Status: AC
Start: 2021-11-22 — End: 2021-11-22

## 2021-11-22 MED ORDER — ONDANSETRON HCL 4 MG/2ML IJ SOLN
4.0000 mg | Freq: Four times a day (QID) | INTRAMUSCULAR | Status: DC | PRN
  Administered 2021-11-22 – 2021-11-24 (×2): 4 mg via INTRAVENOUS
  Filled 2021-11-22 (×2): qty 2

## 2021-11-22 MED ORDER — ACETAMINOPHEN 650 MG RE SUPP: 650.0000 mg | RECTAL | Status: DC | PRN

## 2021-11-22 MED ORDER — PROCHLORPERAZINE EDISYLATE 10 MG/2ML IJ SOLN
5.0000 mg | Freq: Once | INTRAMUSCULAR | Status: AC
  Administered 2021-11-22: 5 mg via INTRAVENOUS
  Filled 2021-11-22: qty 2

## 2021-11-22 MED ORDER — METRONIDAZOLE 500 MG/100ML IV SOLN
500.0000 mg | Freq: Once | INTRAVENOUS | Status: AC
  Administered 2021-11-22: 500 mg via INTRAVENOUS
  Filled 2021-11-22: qty 100

## 2021-11-22 MED ORDER — PANTOPRAZOLE SODIUM 40 MG PO TBEC
40.0000 mg | DELAYED_RELEASE_TABLET | Freq: Every day | ORAL | Status: DC
  Administered 2021-11-22 – 2021-11-28 (×7): 40 mg via ORAL
  Filled 2021-11-22 (×7): qty 1

## 2021-11-22 MED ORDER — STROKE: EARLY STAGES OF RECOVERY BOOK
Freq: Once | Status: AC
  Filled 2021-11-22 (×2): qty 1

## 2021-11-22 MED ORDER — CLOPIDOGREL BISULFATE 75 MG PO TABS
75.0000 mg | ORAL_TABLET | Freq: Every day | ORAL | Status: DC
  Administered 2021-11-22 – 2021-11-28 (×7): 75 mg via ORAL
  Filled 2021-11-22 (×7): qty 1

## 2021-11-22 MED ORDER — SODIUM CHLORIDE 0.9 % IV SOLN: INTRAVENOUS | Status: DC

## 2021-11-22 MED ORDER — FUROSEMIDE 40 MG PO TABS
120.0000 mg | ORAL_TABLET | Freq: Every day | ORAL | Status: DC
Start: 2021-11-23 — End: 2021-11-23
  Administered 2021-11-23: 120 mg via ORAL
  Filled 2021-11-22: qty 3

## 2021-11-22 MED ORDER — ONDANSETRON HCL 4 MG/2ML IJ SOLN
INTRAMUSCULAR | Status: AC
  Administered 2021-11-22: 4 mg via INTRAVENOUS
  Filled 2021-11-22: qty 2

## 2021-11-22 MED ORDER — ACETAMINOPHEN 160 MG/5ML PO SOLN
650.0000 mg | ORAL | Status: DC | PRN
Start: 2021-11-22 — End: 2021-11-28

## 2021-11-22 MED ORDER — PRAVASTATIN SODIUM 10 MG PO TABS
20.0000 mg | ORAL_TABLET | Freq: Every day | ORAL | Status: DC
  Administered 2021-11-22 – 2021-11-27 (×6): 20 mg via ORAL
  Filled 2021-11-22 (×7): qty 2

## 2021-11-22 MED ORDER — VANCOMYCIN HCL 1500 MG/300ML IV SOLN
1500.0000 mg | INTRAVENOUS | Status: DC
Start: 2021-11-23 — End: 2021-11-23
  Administered 2021-11-23: 1500 mg via INTRAVENOUS
  Filled 2021-11-22: qty 300

## 2021-11-22 MED ORDER — ENOXAPARIN SODIUM 60 MG/0.6ML IJ SOSY
60.0000 mg | PREFILLED_SYRINGE | INTRAMUSCULAR | Status: DC
  Administered 2021-11-22 – 2021-11-23 (×2): 60 mg via SUBCUTANEOUS
  Filled 2021-11-22 (×2): qty 0.6

## 2021-11-22 MED ORDER — ACETAMINOPHEN 325 MG PO TABS: 650.0000 mg | ORAL_TABLET | ORAL | Status: DC | PRN

## 2021-11-22 MED ORDER — SODIUM CHLORIDE 0.9 % IV BOLUS
1000.0000 mL | Freq: Once | INTRAVENOUS | Status: AC
  Administered 2021-11-22: 1000 mL via INTRAVENOUS

## 2021-11-22 MED ORDER — SODIUM CHLORIDE 0.9 % IV SOLN
2.0000 g | Freq: Once | INTRAVENOUS | Status: AC
  Administered 2021-11-22: 2 g via INTRAVENOUS
  Filled 2021-11-22: qty 12.5

## 2021-11-22 MED ORDER — VANCOMYCIN HCL IN DEXTROSE 1-5 GM/200ML-% IV SOLN
1000.0000 mg | Freq: Once | INTRAVENOUS | Status: AC
  Administered 2021-11-22: 1000 mg via INTRAVENOUS
  Filled 2021-11-22: qty 200

## 2021-11-22 NOTE — Progress Notes (Signed)
Pharmacy Antibiotic Note ? ?Bernard Ramirez is a 88 y.o. male admitted on 11/22/2021 with sepsis.  Pharmacy has been consulted for Vancomycin dosing. ? ?Plan: ?Vancomycin 1500 mg IV Q 24 hrs. Goal AUC 400-550. ?Expected AUC: 487 ?SCr used: 1.28  ? ?Height: 6' (182.9 cm) ?Weight: 105 kg (231 lb 7.7 oz) ?IBW/kg (Calculated) : 77.6 ? ?Temp (24hrs), Avg:95.3 ?F (35.2 ?C), Min:95.2 ?F (35.1 ?C), Max:95.5 ?F (35.3 ?C) ? ?Recent Labs  ?Lab 11/22/21 ?1601 11/22/21 ?1034  ?WBC 8.0  --   ?CREATININE 1.28*  --   ?LATICACIDVEN 1.5 1.2  ?  ?Estimated Creatinine Clearance: 50 mL/min (A) (by C-G formula based on SCr of 1.28 mg/dL (H)).   ? ?Allergies  ?Allergen Reactions  ? Contrast Media [Iodinated Contrast Media]   ?  Pt states he was given "dye" 20 yrs ago, anaphylaxis & syncope - was told to never have it again   ? Gabapentin Itching  ? Aspirin Other (See Comments)  ?  Blistering ?  ? Latex Rash  ? Lipitor [Atorvastatin] Itching  ? Tape Rash  ?  EKG leads  ? ? ?Antimicrobials this admission: ?Vancomycin 4/4 >>  ?Cefepime 4/4 >>  ?Flagyl 4/4 >> ? ?Dose adjustments this admission: ? ? ?Microbiology results: ? BCx: pending ? UCx:   ? Sputum:   ? MRSA PCR:  ? ?Thank you for allowing pharmacy to be a part of this patientBernards care. ? ?Hart Robinsons A ?11/22/2021 11:14 AM ? ?

## 2021-11-22 NOTE — ED Notes (Addendum)
Bladder scanned pt per RN and it showed 0 mls. Nurse notified ?

## 2021-11-22 NOTE — ED Notes (Signed)
MRI at bedside. Dr Roderic Palau informed pt has temp foley and cannot have MRI with temp foley and EKG leads. Per Dr Roderic Palau, remove temp foley and all cardiac leads for patient to go to MRI.  ?

## 2021-11-22 NOTE — Progress Notes (Signed)
Elink following code sepsis °

## 2021-11-22 NOTE — ED Notes (Signed)
Patient transported to MRI 

## 2021-11-22 NOTE — ED Provider Notes (Signed)
?[facility] ?Provider Note ? ? ?CSN: [id number] ?Arrival date & time: 11/22/21  3382 ? ?  ? ?History ? ?Chief Complaint  ?Patient presents with  ? Weakness  ? ? ?Bernard Ramirez is a 88 y.o. male. ? ?Patient presents with weakness for 2 days.  And he was on the commode today and he was unable to get up.  He was perspiring and dizzy,.  Patient has a history of GERD and has had inner ear problem.  Patient's wife states he has been weak for 2 or 3 days ? ?The history is provided by the patient and medical records. No language interpreter was used.  ?Weakness ?Severity:  Moderate ?Onset quality:  Sudden ?Timing:  Constant ?Progression:  Worsening ?Chronicity:  New ?Context: not alcohol use   ?Relieved by:  Nothing ?Worsened by:  Nothing ?Ineffective treatments:  None tried ?Associated symptoms: no abdominal pain, no chest pain, no cough, no diarrhea, no frequency, no headaches and no seizures   ? ?  ? ?Home Medications ?Prior to Admission medications   ?Medication Sig Start Date End Date Taking? Authorizing Provider  ?clobetasol cream (TEMOVATE) 5.05 % Apply 1 application topically daily as needed (use as directed for skin). 02/17/21  Yes Gerlene Fee, NP  ?clopidogrel (PLAVIX) 75 MG tablet TAKE 1 TABLET(75 MG) BY MOUTH DAILY 02/17/21  Yes Gerlene Fee, NP  ?furosemide (LASIX) 40 MG tablet TAKE 1 AND 1/2 TABLETS BY MOUTH EVERY MORNING THEN TAKE 1 TABLET BY MOUTH EVERY AFTERNOON ?Patient taking differently: Take 40-120 mg by mouth 2 (two) times daily. Take 3 tablets every morning then take 1 tablet every afternoon 09/19/21  Yes Allred, Jeneen Rinks, MD  ?HYDROcodone-acetaminophen (NORCO) 10-325 MG tablet Take 1 tablet by mouth every 4 (four) hours as needed (maxium dose 5 tabs PO QD). Chronic Pain. Dx: G89.4. 10/17/21  Yes [name], MD  ?losartan (COZAAR) 100 MG tablet TAKE 1 TABLET(100 MG) BY MOUTH DAILY 10/24/21  Yes [name], MD  ?meclizine (ANTIVERT) 25 MG tablet Take one tablet by  mouth at bedtime. 07/28/21  Yes [name], MD  ?NYSTATIN powder APPLY TOPICALLY TO THE AFFECTED AREA EVERY DAY AS NEEDED FOR RASH 04/29/21  Yes [name], MD  ?pantoprazole (PROTONIX) 40 MG tablet TAKE 1 TABLET(40 MG) BY MOUTH DAILY 08/23/21  Yes [name], MD  ?potassium chloride SA (KLOR-CON) 20 MEQ tablet TAKE 1 TABLET(20 MEQ) BY MOUTH DAILY 02/17/21  Yes Gerlene Fee, NP  ?pravastatin (PRAVACHOL) 20 MG tablet TAKE 1 TABLET(20 MG) BY MOUTH DAILY 07/20/21  Yes [name], MD  ?triamcinolone cream (KENALOG) 0.1 % Apply 1 application topically daily as needed (for irritation). 02/17/21  Yes Gerlene Fee, NP  ?clobetasol (TEMOVATE) 0.05 % external solution Apply 1 application topically 2 (two) times daily. 03/14/21   [name], MD  ?NON FORMULARY Diet:Regular, NAS    [provider]  ?silver sulfADIAZINE (SILVADENE) 1 % cream Apply 1 application topically daily. ?Patient not taking: Reported on 11/22/2021 02/28/21   [name], MD  ?   ? ?Allergies    ?Contrast media [iodinated contrast media], Gabapentin, Aspirin, Latex, Lipitor [atorvastatin], and Tape   ? ?Review of Systems   ?Review of Systems  ?Constitutional:  Negative for appetite change and fatigue.  ?HENT:  Negative for congestion, ear discharge and sinus pressure.   ?Eyes:  Negative for discharge.  ?Respiratory:  Negative for cough.   ?Cardiovascular:  Negative for  chest pain.  ?Gastrointestinal:  Negative for abdominal pain and diarrhea.  ?Genitourinary:  Negative for frequency and hematuria.  ?Musculoskeletal:  Negative for back pain.  ?Skin:  Negative for rash.  ?Neurological:  Positive for weakness. Negative for seizures and headaches.  ?Psychiatric/Behavioral:  Negative for hallucinations.   ? ?Physical Exam ?Updated Vital Signs ?BP (!) 109/57   Pulse (!) 50   Temp (!) 97.5 ?F (36.4 ?C) (Oral)   Resp (!) 21   Ht 6' (1.829 m)   Wt 105 kg   SpO2 96%   BMI 31.39 kg/m?  ?Physical Exam ?Vitals and  nursing note reviewed.  ?Constitutional:   ?   Appearance: He is well-developed.  ?HENT:  ?   Head: Normocephalic.  ?   Nose: Nose normal.  ?Eyes:  ?   General: No scleral icterus. ?   Conjunctiva/sclera: Conjunctivae normal.  ?Neck:  ?   Thyroid: No thyromegaly.  ?Cardiovascular:  ?   Rate and Rhythm: Normal rate and regular rhythm.  ?   Heart sounds: No murmur heard. ?  No friction rub. No gallop.  ?Pulmonary:  ?   Breath sounds: No stridor. No wheezing or rales.  ?Chest:  ?   Chest wall: No tenderness.  ?Abdominal:  ?   General: There is no distension.  ?   Tenderness: There is no abdominal tenderness. There is no rebound.  ?Musculoskeletal:     ?   General: Normal range of motion.  ?   Cervical back: Neck supple.  ?Lymphadenopathy:  ?   Cervical: No cervical adenopathy.  ?Skin: ?   Findings: No erythema or rash.  ?Neurological:  ?   Mental Status: He is alert and oriented to person, place, and time.  ?   Motor: No abnormal muscle tone.  ?   Coordination: Coordination normal.  ?Psychiatric:     ?   Behavior: Behavior normal.  ? ? ?ED Results / Procedures / Treatments   ?Labs ?(all labs ordered are listed, but only abnormal results are displayed) ?Labs Reviewed  ?COMPREHENSIVE METABOLIC PANEL - Abnormal; Notable for the following components:  ?    Result Value  ? Glucose, Bld 204 (*)   ? Creatinine, Ser 1.28 (*)   ? Total Bilirubin 1.5 (*)   ? GFR, Estimated 54 (*)   ? All other components within normal limits  ?CBC WITH DIFFERENTIAL/PLATELET - Abnormal; Notable for the following components:  ? Hemoglobin 11.4 (*)   ? HCT 38.5 (*)   ? MCH 24.8 (*)   ? MCHC 29.6 (*)   ? RDW 17.3 (*)   ? All other components within normal limits  ?APTT - Abnormal; Notable for the following components:  ? aPTT 23 (*)   ? All other components within normal limits  ?CBG MONITORING, ED - Abnormal; Notable for the following components:  ? Glucose-Capillary 200 (*)   ? All other components within normal limits  ?CULTURE, BLOOD (ROUTINE X  2)  ?CULTURE, BLOOD (ROUTINE X 2)  ?RESP PANEL BY RT-PCR (FLU A&B, COVID) ARPGX2  ?URINE CULTURE  ?LACTIC ACID, PLASMA  ?LACTIC ACID, PLASMA  ?PROTIME-INR  ?URINALYSIS, ROUTINE W REFLEX MICROSCOPIC  ?TROPONIN I (HIGH SENSITIVITY)  ?TROPONIN I (HIGH SENSITIVITY)  ? ? ?EKG ?None ? ?Radiology ?MR ANGIO HEAD WO CONTRAST ? ?Result Date: 11/22/2021 ?CLINICAL DATA:  Dizziness, persistent/recurrent, cardiac or vascular cause suspected. EXAM: MRI HEAD WITHOUT CONTRAST MRA HEAD WITHOUT CONTRAST TECHNIQUE: Multiplanar, multi-echo pulse sequences of the brain and surrounding structures were  acquired without intravenous contrast. Angiographic images of the Circle of Willis were acquired using MRA technique without intravenous contrast. COMPARISON:  Head CT 06/16/2021.  MRI 10/15/2015. FINDINGS: MRI HEAD FINDINGS Brain: Diffusion imaging shows numerous scattered acute infarctions within the inferior to mid cerebellum on the right. No mass effect or visible hemorrhage. Cerebral hemispheres show old infarctions in both occipital regions, the inferior left frontal lobe, and extensively throughout the cerebral hemispheric white matter. No mass lesion, hemorrhage, hydrocephalus or extra-axial collection. Vascular: Major vessels at the base of the brain show flow. Skull and upper cervical spine: Negative Sinuses/Orbits: Clear/normal Other: None MRA HEAD FINDINGS Anterior circulation: Both internal carotid arteries are patent through the skull base and siphon regions without stenosis. The anterior and middle cerebral vessels are patent. No proximal stenosis, aneurysm or vascular malformation. Posterior circulation: Both vertebral arteries widely patent to the basilar. No visible right PICA flow. No basilar stenosis. Other posterior circulation branch vessels show flow presently. Distal branch vessels not evaluated by this technique. Anatomic variants: None significant. IMPRESSION: Scattered acute infarctions within the inferior to mid  cerebellum on the right consistent with infarction in the right PICA territory. No visible PICA flow on the right by MR angiography, though resolution is limited. No evidence of hemorrhage or significant mass effe

## 2021-11-22 NOTE — ED Notes (Signed)
Once pt moved, pt got nauseated and vomited a small amount. Pt given PRN zofran ?

## 2021-11-22 NOTE — ED Notes (Signed)
Pt removed from all monitors and temp Foley was taken out per EDP. ? ?

## 2021-11-22 NOTE — ED Notes (Signed)
[facility] present to transport pt 

## 2021-11-22 NOTE — ED Notes (Signed)
Pt has not urinated since catheter removal. Bladder scanned, no urine found in bladder.  ?

## 2021-11-22 NOTE — ED Triage Notes (Addendum)
Pt brought to ED via [facility] emergency traffic with complaints of weakness. EMS stated HR ranged from 30-50. Pt was found on toilet diaphoretic. Passed stroke screen with EMS. CBG 206.  ?

## 2021-11-22 NOTE — ED Notes (Signed)
Pt repositioned and meal tray given. Spouse at bedside assisting pt.  ?

## 2021-11-22 NOTE — ED Notes (Signed)
Pt not complaining of any pain at this time. Wife left to get food. Pt now resting.  ? ?Wife phone number [phone] ?

## 2021-11-22 NOTE — H&P (Addendum)
?History and Physical  ? ? ?Bernard Ramirez ZDG:[id number] DOB: 27-Apr-1931 DOA: 11/22/2021 ? ?PCP: [name], MD  ?Patient coming from: Home ? ?I have personally briefly reviewed patient's old medical records in [place] ? ?Chief Complaint: Weakness ? ?HPI: Bernard Ramirez is a 88 y.o. male with medical history significant of permanent atrial fibrillation, not on any anticoagulation but only on Plavix, BPH, hypertension was brought into the ED due to weakness.  History was obtained from the wife and was gathered from chart review and ED physician.  Per history, wife had noted significant weakness, lethargy and excessive sleepiness in the patient for last couple of days.  This morning when he was on the toilet, he could not get up and yelled for help.  His wife was then concerned and brought him to the emergency department.  Patient at this point in time during my evaluation in the ED says " I do not feel well" but he could not come up with any specific complaint.  He did feel nauseous and vomited when I saw him. ? ?ED Course: Upon arrival to ED, he was hypothermic and bradycardic.  Blood pressure was fairly stable.  He ended up having MRI brain and MR angiogram of the head which showed multiple infarctions mainly in the inferior to mid cerebellum on the right in the PICA territory.  ED physician discussed the case with Dr. Curly Shores of neurology who recommended admission at [facility] for further work-up and consultation with neurology.  Hospitalist were consulted for admission. ? ?Review of Systems: As per HPI otherwise negative.  ? ? ?Past Medical History:  ?Diagnosis Date  ? Anemia   ? 04/2012: H&H-10.2/31.5, MCV-77; 06/2012:12/38.9  ? Arthritis   ? Benign prostatic hypertrophy   ? s/p transurethral resection of the prostate  ? Chest pain   ? 2004; with dyspnea, stress nuclear-normal EF + questionable inferior wall ischemia, normal coronary angiography;  2010-negative stress nuclear  ? CVA (cerebral  infarction)   ? asymptomatic (seen on CT)  ? Diarrhea   ? h/o Hemoccult-positive stool  ? Dysrhythmia   ? Edema of both legs   ? GERD (gastroesophageal reflux disease)   ? History of blood transfusion   ? History of kidney stones   ? History of urinary tract infection   ? Hypertension   ? Jerking movements of extremities   ? left arm   ? Low back pain   ? Obstructive sleep apnea   ? pt states can not use the CPAP  ? Peripheral neuropathy   ? lower legs and feet bilat   ? Persistent atrial fibrillation ([place]) 2013  ? Asymptomatic; diagnosed in 05/2012  ? Pneumonia   ? Prostate cancer ([place]) 2017  ? stage 4  ? Shortness of breath dyspnea   ? pt states only develops SOB if tries to do something too quickly  ? Upper GI bleed 1985  ? 1985-peptic ulcer disease; initial hemigastrectomy; subsequent Billroth II  ? Urinary hesitancy   ? Vertigo   ? ED evaluation-06/2012  ? ? ?Past Surgical History:  ?Procedure Laterality Date  ? Billroth II    ? CHOLECYSTECTOMY    ? COLONOSCOPY  Approximately 2000  ? COLONOSCOPY  10/2012  ? Colonoscopy March 2014 at [facility], difficulty exam requiring fluoroscopy and overtube. Patient developed severe bradycardia again during his procedure like he did [facility]. Multiple polyps removed which were tubular adenomas. Previously tattooed sites at 20 and 30 cm  were free of any polypoid change. Left-sided diverticulosis noted.  ? COLONOSCOPY WITH ESOPHAGOGASTRODUODENOSCOPY (EGD)  06-17-2009  ? FYB:OFBPZWCH tubular adenomas and 2 tubulovillous adenomas, incomplete, 3 clips placed  ? ESOPHAGOGASTRODUODENOSCOPY  06/17/2009  ? SLF: normal/chronic gastritis (non-h pylori)  ? ESOPHAGOGASTRODUODENOSCOPY N/A 05/11/2015  ? ENI:DPOEUMP anemai due to postgastrectomy state/moderate erosive gastritis  ? INGUINAL HERNIA REPAIR    ? Left  ? [place]  ? Bilateral  ? TRANSURETHRAL RESECTION OF PROSTATE  06/2016  ? TRANSURETHRAL RESECTION OF PROSTATE N/A 12/27/2012  ? Procedure: TRANSURETHRAL  RESECTION OF THE PROSTATE (TURP);  Surgeon: Marissa Nestle, MD;  Location: [facility];  Service: Urology;  Laterality: N/A;  ? TRANSURETHRAL RESECTION OF PROSTATE N/A 04/11/2016  ? Procedure: TRANSURETHRAL RESECTION OF THE PROSTATE (TURP);  Surgeon: Irine Seal, MD;  Location: [facility];  Service: Urology;  Laterality: N/A;  ? VAGOTOMY AND PYLOROPLASTY  1970s  ? Details of procedure uncertain; 5361W  ? ? ? reports that he quit smoking about 18 years ago. His smoking use included cigars. He has never used smokeless tobacco. He reports that he does not drink alcohol and does not use drugs. ? ?Allergies  ?Allergen Reactions  ? Contrast Media [Iodinated Contrast Media]   ?  Pt states he was given "dye" 20 yrs ago, anaphylaxis & syncope - was told to never have it again   ? Gabapentin Itching  ? Aspirin Other (See Comments)  ?  Blistering ?  ? Latex Rash  ? Lipitor [Atorvastatin] Itching  ? Tape Rash  ?  EKG leads  ? ? ?Family History  ?Problem Relation Age of Onset  ? Diabetes Mellitus II Mother   ? Allergic rhinitis Mother   ? Lung disease Father   ? Allergic rhinitis Father   ? Angioedema Neg Hx   ? Asthma Neg Hx   ? Atopy Neg Hx   ? Eczema Neg Hx   ? Immunodeficiency Neg Hx   ? Urticaria Neg Hx   ? Cystic fibrosis Neg Hx   ? Lupus Neg Hx   ? Emphysema Neg Hx   ? Migraines Neg Hx   ? ? ?Prior to Admission medications   ?Medication Sig Start Date End Date Taking? Authorizing Provider  ?clobetasol cream (TEMOVATE) 4.31 % Apply 1 application topically daily as needed (use as directed for skin). 02/17/21  Yes Gerlene Fee, NP  ?clopidogrel (PLAVIX) 75 MG tablet TAKE 1 TABLET(75 MG) BY MOUTH DAILY 02/17/21  Yes Gerlene Fee, NP  ?furosemide (LASIX) 40 MG tablet TAKE 1 AND 1/2 TABLETS BY MOUTH EVERY MORNING THEN TAKE 1 TABLET BY MOUTH EVERY AFTERNOON ?Patient taking differently: Take 40-120 mg by mouth 2 (two) times daily. Take 3 tablets every morning then take 1 tablet every afternoon 09/19/21  Yes Allred, Jeneen Rinks, MD   ?HYDROcodone-acetaminophen (NORCO) 10-325 MG tablet Take 1 tablet by mouth every 4 (four) hours as needed (maxium dose 5 tabs PO QD). Chronic Pain. Dx: G89.4. 10/17/21  Yes [name], MD  ?losartan (COZAAR) 100 MG tablet TAKE 1 TABLET(100 MG) BY MOUTH DAILY 10/24/21  Yes [name], MD  ?meclizine (ANTIVERT) 25 MG tablet Take one tablet by mouth at bedtime. 07/28/21  Yes [name], MD  ?NYSTATIN powder APPLY TOPICALLY TO THE AFFECTED AREA EVERY DAY AS NEEDED FOR RASH 04/29/21  Yes [name], MD  ?pantoprazole (PROTONIX) 40 MG tablet TAKE 1 TABLET(40 MG) BY MOUTH DAILY 08/23/21  Yes [name],  MD  ?potassium chloride SA (KLOR-CON) 20 MEQ tablet TAKE 1 TABLET(20 MEQ) BY MOUTH DAILY 02/17/21  Yes Gerlene Fee, NP  ?pravastatin (PRAVACHOL) 20 MG tablet TAKE 1 TABLET(20 MG) BY MOUTH DAILY 07/20/21  Yes [name], MD  ?triamcinolone cream (KENALOG) 0.1 % Apply 1 application topically daily as needed (for irritation). 02/17/21  Yes Gerlene Fee, NP  ?clobetasol (TEMOVATE) 0.05 % external solution Apply 1 application topically 2 (two) times daily. 03/14/21   [name], MD  ?NON FORMULARY Diet:Regular, NAS    [provider]  ?silver sulfADIAZINE (SILVADENE) 1 % cream Apply 1 application topically daily. ?Patient not taking: Reported on 11/22/2021 02/28/21   [name], MD  ? ? ?Physical Exam: ?Vitals:  ? 11/22/21 1230 11/22/21 1300 11/22/21 1320 11/22/21 1330  ?BP: 111/61 119/60  (!) 109/57  ?Pulse: (!) 51 (!) 55 (!) 55 (!) 50  ?Resp: (!) 21 20 (!) 22 (!) 21  ?Temp:   (!) 97.5 ?F (36.4 ?C) 97.8 ?F (36.6 ?C)  ?TempSrc:   Oral Oral  ?SpO2: 98% 97% 96% 96%  ?Weight:      ?Height:      ? ? ?Constitutional: NAD, calm, comfortable ?Vitals:  ? 11/22/21 1230 11/22/21 1300 11/22/21 1320 11/22/21 1330  ?BP: 111/61 119/60  (!) 109/57  ?Pulse: (!) 51 (!) 55 (!) 55 (!) 50  ?Resp: (!) 21 20 (!) 22 (!) 21  ?Temp:   (!) 97.5 ?F (36.4 ?C) 97.8 ?F (36.6 ?C)  ?TempSrc:   Oral  Oral  ?SpO2: 98% 97% 96% 96%  ?Weight:      ?Height:      ? ?Eyes: PERRL, lids and conjunctivae normal ?ENMT: Mucous membranes are moist. Posterior pharynx clear of any exudate or lesions.Normal dentition.  ?Neck: norm

## 2021-11-23 ENCOUNTER — Encounter (HOSPITAL_COMMUNITY): Payer: Self-pay | Admitting: Family Medicine

## 2021-11-23 ENCOUNTER — Observation Stay (HOSPITAL_COMMUNITY): Payer: PPO

## 2021-11-23 DIAGNOSIS — I6389 Other cerebral infarction: Secondary | ICD-10-CM

## 2021-11-23 DIAGNOSIS — R531 Weakness: Secondary | ICD-10-CM | POA: Diagnosis not present

## 2021-11-23 DIAGNOSIS — T68XXXA Hypothermia, initial encounter: Secondary | ICD-10-CM

## 2021-11-23 DIAGNOSIS — I63541 Cerebral infarction due to unspecified occlusion or stenosis of right cerebellar artery: Secondary | ICD-10-CM | POA: Diagnosis present

## 2021-11-23 DIAGNOSIS — Z66 Do not resuscitate: Secondary | ICD-10-CM | POA: Diagnosis present

## 2021-11-23 DIAGNOSIS — I480 Paroxysmal atrial fibrillation: Secondary | ICD-10-CM | POA: Diagnosis not present

## 2021-11-23 DIAGNOSIS — E785 Hyperlipidemia, unspecified: Secondary | ICD-10-CM | POA: Diagnosis present

## 2021-11-23 DIAGNOSIS — E860 Dehydration: Secondary | ICD-10-CM | POA: Diagnosis present

## 2021-11-23 DIAGNOSIS — Z8673 Personal history of transient ischemic attack (TIA), and cerebral infarction without residual deficits: Secondary | ICD-10-CM | POA: Diagnosis not present

## 2021-11-23 DIAGNOSIS — R680 Hypothermia, not associated with low environmental temperature: Secondary | ICD-10-CM | POA: Diagnosis not present

## 2021-11-23 DIAGNOSIS — Z7401 Bed confinement status: Secondary | ICD-10-CM | POA: Diagnosis not present

## 2021-11-23 DIAGNOSIS — Z8546 Personal history of malignant neoplasm of prostate: Secondary | ICD-10-CM | POA: Diagnosis not present

## 2021-11-23 DIAGNOSIS — R001 Bradycardia, unspecified: Secondary | ICD-10-CM | POA: Diagnosis not present

## 2021-11-23 DIAGNOSIS — R29703 NIHSS score 3: Secondary | ICD-10-CM | POA: Diagnosis present

## 2021-11-23 DIAGNOSIS — I10 Essential (primary) hypertension: Secondary | ICD-10-CM | POA: Diagnosis present

## 2021-11-23 DIAGNOSIS — Z87891 Personal history of nicotine dependence: Secondary | ICD-10-CM | POA: Diagnosis not present

## 2021-11-23 DIAGNOSIS — K219 Gastro-esophageal reflux disease without esophagitis: Secondary | ICD-10-CM | POA: Diagnosis not present

## 2021-11-23 DIAGNOSIS — Z833 Family history of diabetes mellitus: Secondary | ICD-10-CM | POA: Diagnosis not present

## 2021-11-23 DIAGNOSIS — R29810 Facial weakness: Secondary | ICD-10-CM | POA: Diagnosis present

## 2021-11-23 DIAGNOSIS — C7951 Secondary malignant neoplasm of bone: Secondary | ICD-10-CM | POA: Diagnosis present

## 2021-11-23 DIAGNOSIS — Z7902 Long term (current) use of antithrombotics/antiplatelets: Secondary | ICD-10-CM | POA: Diagnosis not present

## 2021-11-23 DIAGNOSIS — G629 Polyneuropathy, unspecified: Secondary | ICD-10-CM | POA: Diagnosis present

## 2021-11-23 DIAGNOSIS — G459 Transient cerebral ischemic attack, unspecified: Secondary | ICD-10-CM | POA: Diagnosis not present

## 2021-11-23 DIAGNOSIS — I4819 Other persistent atrial fibrillation: Secondary | ICD-10-CM | POA: Diagnosis not present

## 2021-11-23 DIAGNOSIS — I4821 Permanent atrial fibrillation: Secondary | ICD-10-CM

## 2021-11-23 DIAGNOSIS — N400 Enlarged prostate without lower urinary tract symptoms: Secondary | ICD-10-CM | POA: Diagnosis present

## 2021-11-23 DIAGNOSIS — N179 Acute kidney failure, unspecified: Secondary | ICD-10-CM | POA: Diagnosis present

## 2021-11-23 DIAGNOSIS — R270 Ataxia, unspecified: Secondary | ICD-10-CM | POA: Diagnosis present

## 2021-11-23 DIAGNOSIS — Z7189 Other specified counseling: Secondary | ICD-10-CM | POA: Diagnosis not present

## 2021-11-23 DIAGNOSIS — I639 Cerebral infarction, unspecified: Secondary | ICD-10-CM | POA: Diagnosis present

## 2021-11-23 DIAGNOSIS — Z20822 Contact with and (suspected) exposure to covid-19: Secondary | ICD-10-CM | POA: Diagnosis present

## 2021-11-23 DIAGNOSIS — X31XXXA Exposure to excessive natural cold, initial encounter: Secondary | ICD-10-CM | POA: Diagnosis not present

## 2021-11-23 DIAGNOSIS — G9341 Metabolic encephalopathy: Secondary | ICD-10-CM | POA: Diagnosis present

## 2021-11-23 DIAGNOSIS — N401 Enlarged prostate with lower urinary tract symptoms: Secondary | ICD-10-CM | POA: Diagnosis not present

## 2021-11-23 DIAGNOSIS — Z515 Encounter for palliative care: Secondary | ICD-10-CM | POA: Diagnosis not present

## 2021-11-23 LAB — ECHOCARDIOGRAM COMPLETE
AR max vel: 1.42 cm2
AV Area VTI: 1.55 cm2
AV Area mean vel: 1.59 cm2
AV Mean grad: 5 mmHg
AV Peak grad: 11.3 mmHg
Ao pk vel: 1.68 m/s
Area-P 1/2: 3.68 cm2
Height: 72 in
S' Lateral: 3.5 cm
Weight: 3703.73 oz

## 2021-11-23 LAB — GLUCOSE, CAPILLARY
Glucose-Capillary: 143 mg/dL — ABNORMAL HIGH (ref 70–99)
Glucose-Capillary: 163 mg/dL — ABNORMAL HIGH (ref 70–99)
Glucose-Capillary: 163 mg/dL — ABNORMAL HIGH (ref 70–99)

## 2021-11-23 LAB — URINE CULTURE: Culture: NO GROWTH

## 2021-11-23 LAB — LIPID PANEL
Cholesterol: 117 mg/dL (ref 0–200)
HDL: 54 mg/dL (ref >40)
LDL Cholesterol: 52 mg/dL (ref 0–99)
Total CHOL/HDL Ratio: 2.2 RATIO
Triglycerides: 56 mg/dL (ref <150)
VLDL: 11 mg/dL (ref 0–40)

## 2021-11-23 MED ORDER — PERFLUTREN LIPID MICROSPHERE
1.0000 mL | INTRAVENOUS | Status: AC | PRN
  Administered 2021-11-23: 8 mL via INTRAVENOUS
  Filled 2021-11-23: qty 10

## 2021-11-23 NOTE — NC FL2 (Signed)
?[place] MEDICAID FL2 LEVEL OF CARE SCREENING TOOL  ?  ? ?IDENTIFICATION  ?Patient Name: ?Bernard Ramirez Birthdate: 01/31/1931 Sex: male Admission Date (Current Location): ?11/22/2021  ?[place] and [place] Number: ? [place] and Address:  ?[facility] [place] [place] [address] [place], [place] [address] ?     Provider Number: ?[id number]  ?Attending Physician Name and Address:  ?Pokhrel, Corrie Mckusick, MD ? Relative Name and Phone Number:  ?  ?   ?Current Level of Care: ?Hospital Recommended Level of Care: ?[place] Prior Approval Number: ?  ? ?Date Approved/Denied: ?  PASRR Number: ?[id number] A ? ?Discharge Plan: ?SNF ?  ? ?Current Diagnoses: ?Patient Active Problem List  ? Diagnosis Date Noted  ? Acute ischemic stroke ([place]) 11/22/2021  ? Hypothermia 11/22/2021  ? Sinus bradycardia 11/22/2021  ? GERD without esophagitis 02/18/2021  ? Hypokalemia 02/18/2021  ? Hyperlipidemia LDL goal <100 02/18/2021  ? Hypertensive heart disease with congestive heart failure ([place]) 02/18/2021  ? Snoring 02/08/2021  ? Failure to thrive in adult   ? Cellulitis 02/01/2021  ? Social problem 02/01/2021  ? Protein calorie malnutrition ([place]) 02/01/2021  ? Anxiety 02/01/2021  ? Congestive heart failure (CHF) ([place]) 10/08/2016  ? AKI (acute kidney injury) ([place]) 10/08/2016  ? Acute renal insufficiency due to procedure 04/13/2016  ? BPH (benign prostatic hypertrophy) with urinary obstruction 04/11/2016  ? Shortness of breath 09/27/2015  ? Absolute anemia   ? ED (erectile dysfunction) of organic origin 11/06/2014  ? Benign non-nodular prostatic hyperplasia with lower urinary tract symptoms 11/06/2014  ? Increased frequency of urination 10/12/2014  ? Post-traumatic bulbous urethral stricture 10/12/2014  ? Atrial fibrillation by electrocardiogram ([place]) 11/08/2012  ? Former smoker 11/08/2012  ? Chronic kidney disease, stage II (mild) 07/11/2012  ? Vertigo   ? Permanent atrial fibrillation ([place])   ? Anemia  06/10/2009  ? Essential hypertension 06/10/2009  ? Back pain 06/10/2009  ? DIARRHEA, CHRONIC 06/10/2009  ? DIVERTICULITIS, HX OF 06/10/2009  ? ? ?Orientation RESPIRATION BLADDER Height & Weight   ?  ?Self ? Normal Incontinent Weight: 231 lb 7.7 oz (105 kg) ?Height:  6' (182.9 cm)  ?BEHAVIORAL SYMPTOMS/MOOD NEUROLOGICAL BOWEL NUTRITION STATUS  ?    Continent Diet (heart healthy)  ?AMBULATORY STATUS COMMUNICATION OF NEEDS Skin   ?Extensive Assist Verbally Normal ?  ?  ?  ?    ?     ?     ? ? ?Personal Care Assistance Level of Assistance  ?Bathing, Feeding, Dressing Bathing Assistance: Maximum assistance ?Feeding assistance: Limited assistance ?Dressing Assistance: Maximum assistance ?   ? ?Functional Limitations Info  ?    ?  ?   ? ? ?SPECIAL CARE FACTORS FREQUENCY  ?PT (By licensed PT), OT (By licensed OT)   ?  ?PT Frequency: 5x/wk ?OT Frequency: 5x/wk ?  ?  ?  ?   ? ? ?Contractures Contractures Info: Not present  ? ? ?Additional Factors Info  ?Code Status, Allergies Code Status Info: DNR ?Allergies Info: Contrast Media (Iodinated Contrast Media), Gabapentin, Aspirin, Latex, Lipitor (Atorvastatin), Tape ?  ?  ?  ?   ? ?Current Medications (11/23/2021):  This is the current hospital active medication list ?Current Facility-Administered Medications  ?Medication Dose Route Frequency Provider Last Rate Last Admin  ?  stroke: mapping our early stages of recovery book   Does not apply Once Darliss Cheney, MD      ? 0.9 %  sodium chloride infusion  Intravenous Continuous Darliss Cheney, MD 100 mL/hr at 11/23/21 1138 New Bag at 11/23/21 1138  ? acetaminophen (TYLENOL) tablet 650 mg  650 mg Oral Q4H PRN Darliss Cheney, MD      ? Or  ? acetaminophen (TYLENOL) 160 MG/5ML solution 650 mg  650 mg Per Tube Q4H PRN Darliss Cheney, MD      ? Or  ? acetaminophen (TYLENOL) suppository 650 mg  650 mg Rectal Q4H PRN Darliss Cheney, MD      ? clopidogrel (PLAVIX) tablet 75 mg  75 mg Oral Daily Darliss Cheney, MD   75 mg at 11/23/21 9038  ?  enoxaparin (LOVENOX) injection 60 mg  60 mg Subcutaneous Q24H Darliss Cheney, MD   60 mg at 11/22/21 1526  ? ondansetron (ZOFRAN) injection 4 mg  4 mg Intravenous Q6H PRN Darliss Cheney, MD   4 mg at 11/22/21 2222  ? pantoprazole (PROTONIX) EC tablet 40 mg  40 mg Oral Daily Darliss Cheney, MD   40 mg at 11/23/21 3338  ? pravastatin (PRAVACHOL) tablet 20 mg  20 mg Oral q1800 Darliss Cheney, MD   20 mg at 11/22/21 1744  ? senna-docusate (Senokot-S) tablet 1 tablet  1 tablet Oral QHS PRN Darliss Cheney, MD      ? ? ? ?Discharge Medications: ?Please see discharge summary for a list of discharge medications. ? ?Relevant Imaging Results: ? ?Relevant Lab Results: ? ? ?Additional Information ?SS#: [id number] ? ?Geralynn Ochs, LCSW ? ? ? ? ?

## 2021-11-23 NOTE — Consult Note (Signed)
Neurology Consultation ?Reason for Consult: Cerebellar Stroke ?Referring Physician: Pakhrel, L ? ?CC: Cerebellar Stroke ? ?History is obtained from: patient ? ?HPI: Bernard Ramirez is a 88 y.o. male with a history of silent CVA, htn, prostate cancer who presents with cerebellar stroke. He was last normal a few days ago, but has had more generalizzed weakness since a few days ago pert H&P(tried calling spouse but got no answer).  He states that he has been having some vertigo for the past couple of days. ? ?He presented to the emergency department at [facility] where he was hypothermic, confused, complaining of generalized weakness.  As part of his work-up, an MRI was obtained which does demonstrate cerebellar stroke and neurology has been consulted for further evaluation. ? ? ?LKW: "a few days ago" per chart.  ?tpa given?: no, out of window.  ? ? ?Past Medical History:  ?Diagnosis Date  ? Anemia   ? 04/2012: H&H-10.2/31.5, MCV-77; 06/2012:12/38.9  ? Arthritis   ? Benign prostatic hypertrophy   ? s/p transurethral resection of the prostate  ? Chest pain   ? 2004; with dyspnea, stress nuclear-normal EF + questionable inferior wall ischemia, normal coronary angiography;  2010-negative stress nuclear  ? CVA (cerebral infarction)   ? asymptomatic (seen on CT)  ? Diarrhea   ? h/o Hemoccult-positive stool  ? Dysrhythmia   ? Edema of both legs   ? GERD (gastroesophageal reflux disease)   ? History of blood transfusion   ? History of kidney stones   ? History of urinary tract infection   ? Hypertension   ? Jerking movements of extremities   ? left arm   ? Low back pain   ? Obstructive sleep apnea   ? pt states can not use the CPAP  ? Peripheral neuropathy   ? lower legs and feet bilat   ? Persistent atrial fibrillation ([place]) 2013  ? Asymptomatic; diagnosed in 05/2012  ? Pneumonia   ? Prostate cancer ([place]) 2017  ? stage 4  ? Shortness of breath dyspnea   ? pt states only develops SOB if tries to do something too quickly  ?  Upper GI bleed 1985  ? 1985-peptic ulcer disease; initial hemigastrectomy; subsequent Billroth II  ? Urinary hesitancy   ? Vertigo   ? ED evaluation-06/2012  ? ? ? ?Family History  ?Problem Relation Age of Onset  ? Diabetes Mellitus II Mother   ? Allergic rhinitis Mother   ? Lung disease Father   ? Allergic rhinitis Father   ? Angioedema Neg Hx   ? Asthma Neg Hx   ? Atopy Neg Hx   ? Eczema Neg Hx   ? Immunodeficiency Neg Hx   ? Urticaria Neg Hx   ? Cystic fibrosis Neg Hx   ? Lupus Neg Hx   ? Emphysema Neg Hx   ? Migraines Neg Hx   ? ? ? ?Social History:  reports that he quit smoking about 18 years ago. His smoking use included cigars. He has never used smokeless tobacco. He reports that he does not drink alcohol and does not use drugs. ? ? ?Exam: ?Current vital signs: ?BP 135/67 (BP Location: Right Arm)   Pulse 62   Temp (!) 97.5 ?F (36.4 ?C) (Oral)   Resp 18   Ht 6' (1.829 m)   Wt 105 kg   SpO2 94%   BMI 31.39 kg/m?  ?Vital signs in last 24 hours: ?Temp:  [95.2 ?F (35.1 ?C)-98.8 ?F (37.1 ?C)]  97.5 ?F (36.4 ?C) (04/05 8101) ?Pulse Rate:  [41-79] 62 (04/05 0718) ?Resp:  [16-42] 18 (04/05 0718) ?BP: (99-142)/(46-98) 135/67 (04/05 7510) ?SpO2:  [92 %-100 %] 94 % (04/05 0718) ?Weight:  [105 kg] 105 kg (04/04 0841) ? ? ?Physical Exam  ?Constitutional: Appears well-developed and well-nourished.  ?Psych: Affect appropriate to situation ?Eyes: No scleral injection ?HENT: No OP obstruction ?MSK: no joint deformities.  ?Cardiovascular: Normal rate and regular rhythm.  ?Respiratory: Effort normal, non-labored breathing ?GI: Soft.  No distension. There is no tenderness.  ?Skin: WDI ? ?Neuro: ?Mental Status: ?Patient is awake, alert, he states that he is 88 years old, the year is '63 and he is unsure of the month. ?Cranial Nerves: ?II: Visual Fields are full. Pupils are reactive bilaterally  ?III,IV, VI: EOMI without ptosis or diploplia, he has significant saccadic intrusion of smooth pursuit with some nystagmus. ?V:  Facial sensation is symmetric to temperature ?VII: Has some mild left facial weakness ?VIII: hearing is intact to voice ?X: Uvula elevates symmetrically ?XI: Shoulder shrug is symmetric. ?XII: tongue is midline without atrophy or fasciculations.  ?Motor:  ?He has mild drift in his right upper extremity, but good strength to confrontation.  No drift in either lower extremity ?Sensory: ?Sensation is symmetric to light touch and temperature in the arms and legs. ?Cerebellar: ?FNF and HKS are intact on the left, he is ataxic in both the right arm and leg ? ? ? ? ? ?I have reviewed labs in epic and the results pertinent to this consultation are: ?Cr 1.28 ?LDL 52 ? ?I have reviewed the images obtained:MRI brain-moderate sized righ t cerebellar stroke. ? ?Impression: 88 yo M presenting with large cerebellar stroke. He has extensive cerebrovascular disease with some old subcortical as well as cortical infarcts. Possibilities include intrinsic vessel disease(though less likley given clean MRIA), artery to artery embolus(again less likely with clean MRA) or cardiac embolus.  ? ?Recommendations: ?1) MRA neck ?2) echo ?3) continue home plavix '75mg'$  daily ?4) PT,OT,ST ?5) Telemetry, could consider longer term cardiac monitoring if no source found as inpatient.  ?6) Stroke team to follow.  ? ? ?Roland Rack, MD ?[facility] ?[phone] ? ?If 7pm- 7am, please page neurology on call as listed in [place]. ? ?

## 2021-11-23 NOTE — TOC Initial Note (Signed)
Transition of Care (TOC) - Initial/Assessment Note  ? ? ?Patient Details  ?Name: Bernard Ramirez ?MRN: [id number] ?Date of Birth: [birth year] ? ?Transition of Care [facility]) CM/SW Contact:    ?Geralynn Ochs, LCSW ?Phone Number: ?11/23/2021, 1:54 PM ? ?Clinical Narrative:              CSW met with wife at bedside to discuss recommendation for SNF. CSW answered questions about expected length of stay and insurance coverage, and discussed placement options. Patient has been at [facility] before, but wife wasn't happy with how one of the night shift nurses treated him and she's not sure she'd want him to go back even though that was a while ago. Wife interested in looking at both [place], in case there's a better option in [place]. CSW faxed out referral, will follow with bed offers.   ? ? ?Expected Discharge Plan: [place] ?Barriers to Discharge: Continued Medical Work up, Ship broker ? ? ?Patient Goals and CMS Choice ?Patient states their goals for this hospitalization and ongoing recovery are:: patient unable to participate in goal setting, not fully oriented ?CMS Medicare.gov Compare Post Acute Care list provided to:: Patient Represenative (must comment) ?Choice offered to / list presented to : Spouse ? ?Expected Discharge Plan and Services ?Expected Discharge Plan: [place] ?  ?  ?Post Acute Care Choice: [place] ?Living arrangements for the past 2 months: [place] ?                ?  ?  ?  ?  ?  ?  ?  ?  ?  ?  ? ?Prior Living Arrangements/Services ?Living arrangements for the past 2 months: [place] ?Lives with:: Spouse ?Patient language and need for interpreter reviewed:: No ?Do you feel safe going back to the place where you live?: Yes      ?Need for Family Participation in Patient Care: Yes (Comment) ?Care giver support system in place?: Yes (comment) ?  ?Criminal Activity/Legal Involvement Pertinent to Current  Situation/Hospitalization: No - Comment as needed ? ?Activities of Daily Living ?Home Assistive Devices/Equipment: Gilford Rile (specify type) (4 wheels walker, wheel chair) ?ADL Screening (condition at time of admission) ?Patient's cognitive ability adequate to safely complete daily activities?: Yes ?Is the patient deaf or have difficulty hearing?: No ?Does the patient have difficulty seeing, even when wearing glasses/contacts?: No ?Does the patient have difficulty concentrating, remembering, or making decisions?: No ?Patient able to express need for assistance with ADLs?: Yes ?Does the patient have difficulty dressing or bathing?: Yes ?Independently performs ADLs?: No ?Communication: Independent ?Dressing (OT): Needs assistance ?Is this a change from baseline?: Pre-admission baseline ?Grooming: Needs assistance ?Feeding: Needs assistance ?Is this a change from baseline?: Change from baseline, expected to last <3 days ?Bathing: Needs assistance ?Is this a change from baseline?: Pre-admission baseline ?Toileting: Needs assistance ?Is this a change from baseline?: Pre-admission baseline ?In/Out Bed: Needs assistance ?Is this a change from baseline?: Change from baseline, expected to last <3 days ?Walks in Home: Needs assistance ?Is this a change from baseline?: Change from baseline, expected to last <3 days ?Does the patient have difficulty walking or climbing stairs?: Yes ?Weakness of Legs: Both ?Weakness of Arms/Hands: Both ? ?Permission Sought/Granted ?Permission sought to share information with : Facility Sport and exercise psychologist, Family Supports ?Permission granted to share information with : Yes, Verbal Permission Granted ? Share Information with NAME: [name] ? Permission granted to share info w AGENCY: SNF ?  Permission granted to share info w Relationship: Spouse ?   ? ?Emotional Assessment ?Appearance:: Appears stated age ?Attitude/Demeanor/Rapport: Unable to Assess ?Affect (typically observed): Unable to  Assess ?Orientation: : Oriented to Self ?Alcohol / Substance Use: Not Applicable ?Psych Involvement: No (comment) ? ?Admission diagnosis:  Acute ischemic stroke [facility]) [I63.9] ?Patient Active Problem List  ? Diagnosis Date Noted  ? Acute ischemic stroke ([place]) 11/22/2021  ? Hypothermia 11/22/2021  ? Sinus bradycardia 11/22/2021  ? GERD without esophagitis 02/18/2021  ? Hypokalemia 02/18/2021  ? Hyperlipidemia LDL goal <100 02/18/2021  ? Hypertensive heart disease with congestive heart failure ([place]) 02/18/2021  ? Snoring 02/08/2021  ? Failure to thrive in adult   ? Cellulitis 02/01/2021  ? Social problem 02/01/2021  ? Protein calorie malnutrition ([place]) 02/01/2021  ? Anxiety 02/01/2021  ? Congestive heart failure (CHF) ([place]) 10/08/2016  ? AKI (acute kidney injury) ([place]) 10/08/2016  ? Acute renal insufficiency due to procedure 04/13/2016  ? BPH (benign prostatic hypertrophy) with urinary obstruction 04/11/2016  ? Shortness of breath 09/27/2015  ? Absolute anemia   ? ED (erectile dysfunction) of organic origin 11/06/2014  ? Benign non-nodular prostatic hyperplasia with lower urinary tract symptoms 11/06/2014  ? Increased frequency of urination 10/12/2014  ? Post-traumatic bulbous urethral stricture 10/12/2014  ? Atrial fibrillation by electrocardiogram ([place]) 11/08/2012  ? Former smoker 11/08/2012  ? Chronic kidney disease, stage II (mild) 07/11/2012  ? Vertigo   ? Permanent atrial fibrillation ([place])   ? Anemia 06/10/2009  ? Essential hypertension 06/10/2009  ? Back pain 06/10/2009  ? DIARRHEA, CHRONIC 06/10/2009  ? DIVERTICULITIS, HX OF 06/10/2009  ? ?PCP:  [name], MD ?Pharmacy:   ?[id number] - [place], [address] ?[place] ?[place] [address] ?Phone: [phone] Fax: [phone] ? ?[place], [place] ?[address] ?[place] [address] ?Phone: [phone] Fax:  [phone] ? ? ? ? ?Social Determinants of Health (SDOH) Interventions ?  ? ?Readmission Risk Interventions ? ?  02/02/2021  ?  2:10 PM  ?Readmission Risk Prevention Plan  ?Medication Screening Complete  ?Transportation Screening Complete  ? ? ? ?

## 2021-11-23 NOTE — Plan of Care (Signed)
Pt is alert oriented x 4. Pt c/o nausea and has had one episode of vomiting-mucous like emesis noted. Pt received zofran prior to transport from [facility]. On call provider paged upon arrival to request medication for nausea, received one time order for compazine. Medication given per order, medication effective. Pt now resting with eyes closed.  ? ? ? ?Problem: Education: ?Goal: Knowledge of General Education information will improve ?Description: Including pain rating scale, medication(s)/side effects and non-pharmacologic comfort measures ?Outcome: Progressing ?  ?Problem: Health Behavior/Discharge Planning: ?Goal: Ability to manage health-related needs will improve ?Outcome: Progressing ?  ?Problem: Clinical Measurements: ?Goal: Ability to maintain clinical measurements within normal limits will improve ?Outcome: Progressing ?Goal: Will remain free from infection ?Outcome: Progressing ?Goal: Diagnostic test results will improve ?Outcome: Progressing ?Goal: Respiratory complications will improve ?Outcome: Progressing ?Goal: Cardiovascular complication will be avoided ?Outcome: Progressing ?  ?Problem: Activity: ?Goal: Risk for activity intolerance will decrease ?Outcome: Progressing ?  ?Problem: Nutrition: ?Goal: Adequate nutrition will be maintained ?Outcome: Progressing ?  ?Problem: Coping: ?Goal: Level of anxiety will decrease ?Outcome: Progressing ?  ?Problem: Elimination: ?Goal: Will not experience complications related to bowel motility ?Outcome: Progressing ?Goal: Will not experience complications related to urinary retention ?Outcome: Progressing ?  ?Problem: Pain Managment: ?Goal: General experience of comfort will improve ?Outcome: Progressing ?  ?Problem: Safety: ?Goal: Ability to remain free from injury will improve ?Outcome: Progressing ?  ?Problem: Skin Integrity: ?Goal: Risk for impaired skin integrity will decrease ?Outcome: Progressing ?  ?

## 2021-11-23 NOTE — Progress Notes (Addendum)
PROGRESS NOTE    Bernard Ramirez  WUJ:[id number] DOB: 08-09-1931 DOA: 11/22/2021 PCP: Donita Brooks, MD    Brief Narrative:  Patient is 88 years old male with past medical history of CVA, prostate cancer and atrial fibrillation not on anticoagulation but on Plavix, BPH, hypertension presented to hospital with weakness lethargy and excessive somnolence for few days prior to presentation.  In the ED, patient was hypothermic and bradycardic.  MRI of the brain and MR angiogram of the head showed multiple infarcts in mainly in the inferior to mid cerebellar region on the right in the PICA territory.  He was then admitted hospital for further evaluation and treatment  Assessment and plan  Acute ischemic CVA:  Patient with a large cerebellar stroke and extensive old subcortical and cortical infarcts.   on Plavix.  On permissive hypertension.  Continue statins.  Check MRA of the neck, 2D echocardiogram PT OT speech therapy.  Continue telemetry monitoring.  Stroke team to follow the patient.  Check hemoglobin A1c.  Lipid panel with LDL of 107.  Aspiration precautions.  Metabolic encephalopathy could be secondary to stroke.    Blood cultures negative in 1 day.  Lactate was 1.2.  COVID and influenza was negative.  Urinalysis was negative.  No leukocytosis or fever.  Unlikely to be infection related at this time.  Will discontinue vancomycin at this time.  Essential hypertension:  We will continue to to hold antihypertensives from home.  Allow permissive hypertension.  On high-dose Lasix at home currently on hold.   AKI:  Mild could be secondary to volume depletion.  Continue hydration.  Check BMP in AM.  We will hold Lasix while in the hospital.   GERD: Continue PPI.   Permanent atrial fibrillation:  on Plavix.  Unclear why the patient is not on anticoagulation.   Nausea/vomiting: No further reported.    Hypothermia:  On presentation.  Received Lawyer.  Improved at this  time.  Bradycardia: Could be secondary to hypothermia.  Improved at this time.    Goals of care.  Patient has overall poor prognosis.  We will get palliative care consultation.     DVT prophylaxis:   Lovenox subcu   Code Status:     Code Status: DNR  Disposition: Unsure at this time. Status is: Observation  The patient will require care spanning > 2 midnights and should be moved to inpatient because: Large stroke, encephalopathy, further work-up,   Family Communication: None at bedside.  I tried to reach the patient's spouse on the cell phone and home phone but was unable to reach her.  Consultants:  Neurology  Procedures:  None  Antimicrobials:  Vancomycin   Anti-infectives (From admission, onward)    Start     Dose/Rate Route Frequency Ordered Stop   11/23/21 0600  vancomycin (VANCOREADY) IVPB 1500 mg/300 mL  Status:  Discontinued        1,500 mg 150 mL/hr over 120 Minutes Intravenous Every 24 hours 11/22/21 1114 11/23/21 1004   11/22/21 0900  ceFEPIme (MAXIPIME) 2 g in sodium chloride 0.9 % 100 mL IVPB        2 g 200 mL/hr over 30 Minutes Intravenous  Once 11/22/21 0859 11/22/21 1021   11/22/21 0900  metroNIDAZOLE (FLAGYL) IVPB 500 mg        500 mg 100 mL/hr over 60 Minutes Intravenous  Once 11/22/21 0859 11/22/21 1022   11/22/21 0900  vancomycin (VANCOCIN) IVPB 1000 mg/200 mL premix  1,000 mg 200 mL/hr over 60 Minutes Intravenous  Once 11/22/21 0859 11/22/21 1022      Subjective: Today, patient was seen and examined at bedside.  Patient appears to be sleepy and mildly communicative.  Denies nausea vomiting or pain today.  Objective: Vitals:   11/23/21 0323 11/23/21 0718 11/23/21 1138 11/23/21 1502  BP: 117/61 135/67 124/71 138/72  Pulse: 67 62 (!) 46 (!) 54  Resp: 17 18 16 16   Temp: 98.8 F (37.1 C) (!) 97.5 F (36.4 C) 97.8 F (36.6 C) 98.3 F (36.8 C)  TempSrc: Oral Oral Oral Oral  SpO2: 95% 94% 97% 99%  Weight:      Height:         Intake/Output Summary (Last 24 hours) at 11/23/2021 1655 Last data filed at 11/23/2021 1600 Gross per 24 hour  Intake 1965.79 ml  Output 2400 ml  Net -434.21 ml   Filed Weights   11/22/21 0841  Weight: 105 kg    Physical Examination:  General:  Average built, not in obvious distress, somnolent, HENT:   No scleral pallor or icterus noted. Oral mucosa is moist.  Chest:  Clear breath sounds.  Diminished breath sounds bilaterally.  CVS: S1 &S2 heard. No murmur.  Regular rate and rhythm. Abdomen: Soft, nontender, nondistended.  Bowel sounds are heard.   Extremities: No cyanosis, clubbing extremity edema.  Peripheral pulses are palpable. Psych: Somnolent oriented to place, confused,  CNS: Edema of the legs, weakness on the left side.  Left facial weakness.  Right upper extremity drift Skin: Warm and dry.  No rashes noted.  Data Reviewed:   CBC: Recent Labs  Lab 11/22/21 0845  WBC 8.0  NEUTROABS 5.8  HGB 11.4*  HCT 38.5*  MCV 83.7  PLT 203    Basic Metabolic Panel: Recent Labs  Lab 11/22/21 0845  NA 139  K 3.8  CL 102  CO2 26  GLUCOSE 204*  BUN 23  CREATININE 1.28*  CALCIUM 8.9    Liver Function Tests: Recent Labs  Lab 11/22/21 0845  AST 25  ALT 12  ALKPHOS 91  BILITOT 1.5*  PROT 7.2  ALBUMIN 4.0     Radiology Studies: MR ANGIO HEAD WO CONTRAST  Result Date: 11/22/2021 CLINICAL DATA:  Dizziness, persistent/recurrent, cardiac or vascular cause suspected. EXAM: MRI HEAD WITHOUT CONTRAST MRA HEAD WITHOUT CONTRAST TECHNIQUE: Multiplanar, multi-echo pulse sequences of the brain and surrounding structures were acquired without intravenous contrast. Angiographic images of the Circle of Willis were acquired using MRA technique without intravenous contrast. COMPARISON:  Head CT 06/16/2021.  MRI 10/15/2015. FINDINGS: MRI HEAD FINDINGS Brain: Diffusion imaging shows numerous scattered acute infarctions within the inferior to mid cerebellum on the right. No mass  effect or visible hemorrhage. Cerebral hemispheres show old infarctions in both occipital regions, the inferior left frontal lobe, and extensively throughout the cerebral hemispheric white matter. No mass lesion, hemorrhage, hydrocephalus or extra-axial collection. Vascular: Major vessels at the base of the brain show flow. Skull and upper cervical spine: Negative Sinuses/Orbits: Clear/normal Other: None MRA HEAD FINDINGS Anterior circulation: Both internal carotid arteries are patent through the skull base and siphon regions without stenosis. The anterior and middle cerebral vessels are patent. No proximal stenosis, aneurysm or vascular malformation. Posterior circulation: Both vertebral arteries widely patent to the basilar. No visible right PICA flow. No basilar stenosis. Other posterior circulation branch vessels show flow presently. Distal branch vessels not evaluated by this technique. Anatomic variants: None significant. IMPRESSION: Scattered acute infarctions within the  inferior to mid cerebellum on the right consistent with infarction in the right PICA territory. No visible PICA flow on the right by MR angiography, though resolution is limited. No evidence of hemorrhage or significant mass effect. Old infarctions in both occipital lobes and in the inferior left frontal lobe. Extensive chronic small-vessel ischemic changes throughout the cerebral hemispheric white matter. Electronically Signed   By: Paulina Fusi M.D.   On: 11/22/2021 12:18   MR BRAIN WO CONTRAST  Result Date: 11/22/2021 CLINICAL DATA:  Dizziness, persistent/recurrent, cardiac or vascular cause suspected. EXAM: MRI HEAD WITHOUT CONTRAST MRA HEAD WITHOUT CONTRAST TECHNIQUE: Multiplanar, multi-echo pulse sequences of the brain and surrounding structures were acquired without intravenous contrast. Angiographic images of the Circle of Willis were acquired using MRA technique without intravenous contrast. COMPARISON:  Head CT 06/16/2021.  MRI  10/15/2015. FINDINGS: MRI HEAD FINDINGS Brain: Diffusion imaging shows numerous scattered acute infarctions within the inferior to mid cerebellum on the right. No mass effect or visible hemorrhage. Cerebral hemispheres show old infarctions in both occipital regions, the inferior left frontal lobe, and extensively throughout the cerebral hemispheric white matter. No mass lesion, hemorrhage, hydrocephalus or extra-axial collection. Vascular: Major vessels at the base of the brain show flow. Skull and upper cervical spine: Negative Sinuses/Orbits: Clear/normal Other: None MRA HEAD FINDINGS Anterior circulation: Both internal carotid arteries are patent through the skull base and siphon regions without stenosis. The anterior and middle cerebral vessels are patent. No proximal stenosis, aneurysm or vascular malformation. Posterior circulation: Both vertebral arteries widely patent to the basilar. No visible right PICA flow. No basilar stenosis. Other posterior circulation branch vessels show flow presently. Distal branch vessels not evaluated by this technique. Anatomic variants: None significant. IMPRESSION: Scattered acute infarctions within the inferior to mid cerebellum on the right consistent with infarction in the right PICA territory. No visible PICA flow on the right by MR angiography, though resolution is limited. No evidence of hemorrhage or significant mass effect. Old infarctions in both occipital lobes and in the inferior left frontal lobe. Extensive chronic small-vessel ischemic changes throughout the cerebral hemispheric white matter. Electronically Signed   By: Paulina Fusi M.D.   On: 11/22/2021 12:18   DG Chest Port 1 View  Result Date: 11/22/2021 CLINICAL DATA:  88 year old male with history of weakness and shortness of breath. Irregular heart rate. Possible sepsis. EXAM: PORTABLE CHEST 1 VIEW COMPARISON:  Chest x-ray 06/16/2021. FINDINGS: Transcutaneous defibrillator pad projecting over the lower  left hemithorax. Chronic elevation of the right hemidiaphragm is again noted. Lung volumes are slightly low. Diffuse peribronchial cuffing and patchy areas of interstitial prominence are again noted throughout the lungs bilaterally, similar to prior examinations. No confluent consolidative airspace disease. No pleural effusions. No pneumothorax. No evidence of pulmonary edema. Heart size is borderline enlarged. Upper mediastinal contours are within normal limits. Atherosclerotic calcifications in the thoracic aorta. Surgical clips project over the upper abdomen. IMPRESSION: 1. Similar appearance of the chest with chronic interstitial prominence and peribronchial cuffing which may suggest chronic bronchitis. 2. Chronic elevation of the right hemidiaphragm. 3. Aortic atherosclerosis. Electronically Signed   By: Trudie Reed M.D.   On: 11/22/2021 08:57   ECHOCARDIOGRAM COMPLETE  Result Date: 11/23/2021    ECHOCARDIOGRAM REPORT   Patient Name:   Bernard Ramirez Date of Exam: 11/23/2021 Medical Rec #:  [id number]        Height:       72.0 in Accession #:    [id number]  Weight:       231.5 lb Date of Birth:  September 15, 1930         BSA:          2.267 m Patient Age:    89 years         BP:           135/67 mmHg Patient Gender: M                HR:           62 bpm. Exam Location:  Inpatient Procedure: 2D Echo and Intracardiac Opacification Agent Indications:    Stroke  History:        Patient has prior history of Echocardiogram examinations, most                 recent 12/17/2017. Stroke, Signs/Symptoms:Chest Pain and                 Shortness of Breath; Risk Factors:Hypertension.  Sonographer:    Devonne Doughty Referring Phys: [id number] RAVI PAHWANI  Sonographer Comments: Technically challenging study due to limited acoustic windows. Image acquisition challenging due to patient body habitus. IMPRESSIONS  1. Left ventricular ejection fraction, by estimation, is 50 to 55%. The left ventricle has low normal function. The  left ventricle demonstrates global hypokinesis. There is mild concentric left ventricular hypertrophy. Left ventricular diastolic function could not be evaluated.  2. Right ventricular systolic function is moderately reduced. The right ventricular size is severely enlarged. There is severely elevated pulmonary artery systolic pressure. The estimated right ventricular systolic pressure is 72.1 mmHg.  3. Left atrial size was moderately dilated.  4. Right atrial size was severely dilated.  5. The mitral valve is normal in structure. Mild mitral valve regurgitation. No evidence of mitral stenosis. Moderate mitral annular calcification.  6. Aortic gradients underestimate the stenosis due to low cardiac output. The aortic valve is tricuspid. There is mild calcification of the aortic valve. There is moderate thickening of the aortic valve. Aortic valve regurgitation is not visualized. Mild aortic valve stenosis.  7. Aortic dilatation noted. There is borderline dilatation of the aortic root, measuring 39 mm. There is borderline dilatation of the ascending aorta, measuring 37 mm. Comparison(s): Prior images reviewed side by side. There is worsening of the pulmonary HTN and RV dysfunction. FINDINGS  Left Ventricle: Left ventricular ejection fraction, by estimation, is 50 to 55%. The left ventricle has low normal function. The left ventricle demonstrates global hypokinesis. Definity contrast agent was given IV to delineate the left ventricular endocardial borders. The left ventricular internal cavity size was normal in size. There is mild concentric left ventricular hypertrophy. Left ventricular diastolic function could not be evaluated due to atrial fibrillation. Left ventricular diastolic function could not be evaluated. Right Ventricle: The right ventricular size is severely enlarged. No increase in right ventricular wall thickness. Right ventricular systolic function is moderately reduced. There is severely elevated  pulmonary artery systolic pressure. The tricuspid regurgitant velocity is 3.94 m/s, and with an assumed right atrial pressure of 10 mmHg, the estimated right ventricular systolic pressure is 72.1 mmHg. Left Atrium: Left atrial size was moderately dilated. Right Atrium: Right atrial size was severely dilated. Pericardium: There is no evidence of pericardial effusion. Mitral Valve: The mitral valve is normal in structure. Moderate mitral annular calcification. Mild mitral valve regurgitation, with centrally-directed jet. No evidence of mitral valve stenosis. Tricuspid Valve: The tricuspid valve is normal in structure. Tricuspid valve regurgitation is mild. Aortic  Valve: Aortic gradients underestimate the stenosis due to low cardiac output. The aortic valve is tricuspid. There is mild calcification of the aortic valve. There is moderate thickening of the aortic valve. Aortic valve regurgitation is not visualized. Mild aortic stenosis is present. Aortic valve mean gradient measures 5.0 mmHg. Aortic valve peak gradient measures 11.3 mmHg. Aortic valve area, by VTI measures 1.55 cm. Pulmonic Valve: The pulmonic valve was normal in structure. Pulmonic valve regurgitation is trivial. Aorta: Aortic dilatation noted. There is borderline dilatation of the aortic root, measuring 39 mm. There is borderline dilatation of the ascending aorta, measuring 37 mm. Venous: The inferior vena cava was not well visualized. IAS/Shunts: The interatrial septum was not well visualized.  LEFT VENTRICLE PLAX 2D LVIDd:         4.60 cm   Diastology LVIDs:         3.50 cm   LV e' medial:    6.15 cm/s LV PW:         1.30 cm   LV E/e' medial:  15.9 LV IVS:        1.30 cm   LV e' lateral:   7.73 cm/s LVOT diam:     2.20 cm   LV E/e' lateral: 12.6 LV SV:         55 LV SV Index:   24 LVOT Area:     3.80 cm  RIGHT VENTRICLE RV Basal diam:  4.80 cm RV Mid diam:    4.80 cm RV S prime:     8.40 cm/s TAPSE (M-mode): 1.8 cm LEFT ATRIUM           Index LA  diam:      3.30 cm 1.46 cm/m LA Vol (A4C): 99.5 ml 43.89 ml/m  AORTIC VALVE                     PULMONIC VALVE AV Area (Vmax):    1.42 cm      PV Vmax:       1.19 m/s AV Area (Vmean):   1.59 cm      PV Peak grad:  5.7 mmHg AV Area (VTI):     1.55 cm AV Vmax:           168.00 cm/s AV Vmean:          106.000 cm/s AV VTI:            0.356 m AV Peak Grad:      11.3 mmHg AV Mean Grad:      5.0 mmHg LVOT Vmax:         62.90 cm/s LVOT Vmean:        44.200 cm/s LVOT VTI:          0.145 m LVOT/AV VTI ratio: 0.41  AORTA Ao Root diam: 3.90 cm Ao Asc diam:  3.70 cm MITRAL VALVE               TRICUSPID VALVE MV Area (PHT): 3.68 cm    TR Peak grad:   62.1 mmHg MV Decel Time: 206 msec    TR Vmax:        394.00 cm/s MV E velocity: 97.70 cm/s                            SHUNTS  Systemic VTI:  0.14 m                            Systemic Diam: 2.20 cm Thurmon Fair MD Electronically signed by Thurmon Fair MD Signature Date/Time: 11/23/2021/12:23:39 PM    Final       LOS: 0 days    Joycelyn Das, MD [facility] 11/23/2021, 4:55 PM

## 2021-11-23 NOTE — Evaluation (Signed)
Physical Therapy Evaluation ?Patient Details ?Name: Bernard Ramirez ?MRN: [id number] ?DOB: [birth year] ?Today's Date: 11/23/2021 ? ?History of Present Illness ? Pt. is an 88 y.o. M presenting to [facility] on 4/4 due to weakness while complaining of N/V. Wife noted significant weakness, lethargy, and drowsiness for previous few days. MRI shows multiple infarctions in mid cerebellum in the R PICA area. PMH significant for A-fib, BPH and history of stage IV prostate cancer 2017, peripheral neuropathy, and HTN.  ?Clinical Impression ? Pt presents with decreased functional mobility, strength, balance, coordination, and cognition secondary to diagnosis above. These impairments are limiting his ability to safely and independently transfer, get into his home, perform all adls/iadls, and ambulate in the community. Pt to benefit from acute PT to address deficits. Bed mobility, sitting balance, standing balance, and functional tranfers were performed.  Pt. Responded well but was limited secondary to his cognition, strength, balance, and functional ability. SPT recommends SNF follow up for further transfer, balance, gait, and functional endurance training once medically stable for d/c. Marland Kitchen PT to progress mobility as tolerated, and will continue to follow acutely.  ?   ?   ? ?Recommendations for follow up therapy are one component of a multi-disciplinary discharge planning process, led by the attending physician.  Recommendations may be updated based on patient status, additional functional criteria and insurance authorization. ? ?Follow Up Recommendations Skilled nursing-short term rehab (<3 hours/day) ? ?  ?Assistance Recommended at Discharge Frequent or constant Supervision/Assistance  ?Patient can return home with the following ? A lot of help with walking and/or transfers;Assistance with feeding;A lot of help with bathing/dressing/bathroom;Assist for transportation;Direct supervision/assist for financial management;Help with stairs or  ramp for entrance;Direct supervision/assist for medications management ? ?  ?Equipment Recommendations    ?Recommendations for Other Services ?    ?  ?Functional Status Assessment Patient has had a recent decline in their functional status and demonstrates the ability to make significant improvements in function in a reasonable and predictable amount of time.  ? ?  ?Precautions / Restrictions Precautions ?Precautions: Fall ?Precaution Comments: Nausea during some movement ?Restrictions ?Weight Bearing Restrictions: No  ? ?  ? ?Mobility ? Bed Mobility ?Overal bed mobility: Needs Assistance ?Bed Mobility: Supine to Sit ?  ?  ?Supine to sit: Mod assist ?  ?  ?General bed mobility comments: Mod A for LE management, truncal support, and multimodal cuing ?Patient Response: Flat affect ? ?Transfers ?Overall transfer level: Needs assistance ?Equipment used: Rolling walker (2 wheels) ?Transfers: Sit to/from Stand, Bed to chair/wheelchair/BSC ?Sit to Stand: Mod assist ?  ?Step pivot transfers: Mod assist ?  ?  ?  ?General transfer comment: Mod A for power up, correction of L sided lean, and multimodal processing cues. ?  ? ?Ambulation/Gait ?  ?  ?  ?  ?  ?  ?  ?General Gait Details: unable ? ?Stairs ?  ?  ?  ?  ?  ? ?Wheelchair Mobility ?  ? ?Modified Rankin (Stroke Patients Only) ?Modified Rankin (Stroke Patients Only) ?Pre-Morbid Rankin Score: Slight disability ?Modified Rankin: Severe disability ? ?  ? ?Balance Overall balance assessment: Needs assistance ?Sitting-balance support: Feet supported ?Sitting balance-Leahy Scale: Fair ?Sitting balance - Comments: Able to reach minimally out of BOS to SPT hand and return to upright. ?Postural control: Left lateral lean ?  ?Standing balance-Leahy Scale: Poor ?Standing balance comment: Requires constant support and cuing to correct L sided lean to maintain standing balance. ?  ?  ?  ?  ?  ?  ?  ?  ?  ?  ?  ?   ? ? ? ?  Pertinent Vitals/Pain Pain Assessment ?Pain Assessment:  No/denies pain  ? ? ?Home Living Family/patient expects to be discharged to:: Private residence ?Living Arrangements: Spouse/significant other ?Available Help at Discharge: Available 24 hours/day;Family ?Type of Home: Mobile home ?Home Access: Level entry ?  ?  ?  ?Home Layout: One level ?Home Equipment: Rollator (4 wheels) ?   ?  ?Prior Function Prior Level of Function : Independent/Modified Independent ?  ?  ?  ?  ?  ?  ?Mobility Comments: Doesn't drive ?ADLs Comments: independent, wife does shopping ?  ? ? ?Hand Dominance  ? Dominant Hand: Right ? ?  ?Extremity/Trunk Assessment  ? Upper Extremity Assessment ?Upper Extremity Assessment: Defer to OT evaluation ?  ? ?Lower Extremity Assessment ?Lower Extremity Assessment: RLE deficits/detail;LLE deficits/detail ?RLE Deficits / Details: Hip Flexion 3+/5, hip Abd/add 3/5, K F/E 4/5 ?RLE Sensation: WNL ?RLE Coordination: WNL ?LLE Deficits / Details: Hip Flexion 3+/5, hip Abd/add 3/5, K F/E 4/5 ?LLE Sensation: WNL ?LLE Coordination: WNL ?  ? ?Cervical / Trunk Assessment ?Cervical / Trunk Assessment: Kyphotic  ?Communication  ? Communication: HOH  ?Cognition Arousal/Alertness: Awake/alert ?Behavior During Therapy: Flat affect ?Overall Cognitive Status: Impaired/Different from baseline ?Area of Impairment: Orientation, Attention, Memory, Following commands, Safety/judgement, Awareness, Problem solving ?  ?  ?  ?  ?  ?  ?  ?  ?Orientation Level: Place ?Current Attention Level: Sustained ?Memory: Decreased recall of precautions, Decreased short-term memory ?Following Commands: Follows multi-step commands inconsistently, Follows one step commands with increased time ?Safety/Judgement: Decreased awareness of safety, Decreased awareness of deficits ?Awareness: Emergent ?Problem Solving: Slow processing, Decreased initiation, Difficulty sequencing, Requires verbal cues, Requires tactile cues ?General Comments: Pt. requires increased multimodal cuing for all functional  activites. Slowed processing and skewed sense of vertical when sitting. ?  ?  ? ?  ?General Comments General comments (skin integrity, edema, etc.): In sitting pt. has mild L sided lean, and L sided preferance with head. He is able to correct it when cued but presents with a skewed sense of upright. This is worsened in stance and requires increased cues. ? ?  ?Exercises    ? ?Assessment/Plan  ?  ?PT Assessment Patient needs continued PT services  ?PT Problem List Decreased strength;Decreased mobility;Decreased safety awareness;Impaired tone;Decreased range of motion;Decreased coordination;Decreased knowledge of precautions;Decreased activity tolerance;Decreased cognition;Decreased balance;Decreased knowledge of use of DME ? ?   ?  ?PT Treatment Interventions DME instruction;Therapeutic exercise;Gait training;Balance training;Stair training;Neuromuscular re-education;Functional mobility training;Cognitive remediation;Therapeutic activities;Patient/family education   ? ?PT Goals (Current goals can be found in the Care Plan section)  ?Acute Rehab PT Goals ?Patient Stated Goal: To improve walking and strenght to return home ?PT Goal Formulation: With patient ?Time For Goal Achievement: 12/07/21 ?Potential to Achieve Goals: Fair ? ?  ?Frequency Min 3X/week ?  ? ? ?Co-evaluation   ?  ?  ?  ?  ? ? ?  ?AM-PAC PT "6 Clicks" Mobility  ?Outcome Measure Help needed turning from your back to your side while in a flat bed without using bedrails?: A Little ?Help needed moving from lying on your back to sitting on the side of a flat bed without using bedrails?: A Lot ?Help needed moving to and from a bed to a chair (including a wheelchair)?: A Lot ?Help needed standing up from a chair using your arms (e.g., wheelchair or bedside chair)?: A Lot ?Help needed to walk in hospital room?: A Lot ?Help needed climbing 3-5 steps with a railing? : Total ?6 Click  Score: 12 ? ?  ?End of Session Equipment Utilized During Treatment: Gait  belt ?Activity Tolerance: Patient tolerated treatment well ?Patient left: in chair;with call bell/phone within reach;with chair alarm set ?Nurse Communication: Mobility status ?PT Visit Diagnosis: Unsteadiness on feet (R26.81

## 2021-11-23 NOTE — Hospital Course (Addendum)
Patient is 88 years old male with past medical history of CVA, prostate cancer and atrial fibrillation not on anticoagulation but on Plavix, BPH, hypertension presented to hospital with weakness lethargy and excessive somnolence for few days prior to presentation.  In the ED, patient was hypothermic and bradycardic.  MRI of the brain and MR angiogram of the head showed multiple infarcts in mainly in the inferior to mid cerebellar region on the right in the PICA territory.  Patient was then admitted hospital for further evaluation and treatment ? ?Assessment and plan ?Principal Problem: ?  Acute ischemic stroke ([place]) ?Active Problems: ?  Permanent atrial fibrillation ([place]) ?  AKI (acute kidney injury) ([place]) ?  Hyperlipidemia LDL goal <100 ?  Hypothermia ?  Sinus bradycardia ?  ?Acute ischemic CVA:  ?Patient with a large cerebellar stroke and extensive old subcortical and cortical infarcts.   Seen by neurology.  Continue Plavix and statins.  MRI of the brain, MRA of the neck shows a cerebellar infarct with right PICA territory infarct., 2D echocardiogram with  LV ejection fraction at 50 to 55% with global hypokinesis and mild LVH. PT,OT saw the the patient and recommended skilled nursing facility.   Hemoglobin A1c at 6.3.  Lipid panel with LDL of 107.    Neurology has seen the patient and did not recommend loop recorder at this time.  Recommendation is to continue Plavix and statins at this time.  Patient is allergic to aspirin ? ?Metabolic encephalopathy.  Resolved.  Thought to be secondary to stroke. Blood cultures negative in 2 days.  Lactate was 1.2.  COVID and influenza was negative.  Urinalysis was negative.  No leukocytosis or fever.  ? ?Essential hypertension:  ?Resume Lasix and losartan from home ?  ?AKI:  ?Resolved.  Mild could be secondary to volume depletion.  Latest creatinine of 1.07.  Will be resumed on losartan from home. ? ?GERD: Continue protonix at discharge ?  ?Persistent atrial fibrillation:  ?on  Plavix.  Not on anticoagulation. ? ?Hypothermia:  ?On presentation.  Received Retail banker.  Improved at this time.  No signs of sepsis. ? ?Bradycardia:  Improved at this time.  Avoid nodal blockers. ? ?Goals of care.  Patient had overall guarded prognosis.  Palliative care was consulted.  DNR status. ?

## 2021-11-23 NOTE — Evaluation (Signed)
Occupational Therapy Evaluation ?Patient Details ?Name: Bernard Ramirez ?MRN: [id number] ?DOB: 1930/08/24 ?Today's Date: 11/23/2021 ? ? ?History of Present Illness Pt. is an 88 y.o. M presenting [facility] on 4/4 due to weakness while complaining of N/V. Wife noted significant weakness, lethargy, and drowsiness for previous few days. MRI shows multiple infarctions in mid cerebellum in the R PICA area. PMH significant for A-fib, BPH and history of stage IV prostate cancer 2017, peripheral neuropathy, and HTN.  ? ?Clinical Impression ?  ?Pt admitted for concerns listed above. PTA pt reported that he was independent with all ADL's and IADL's, except for driving. At this time, pt requiring min-max A for all ADL's and functional mobility, due to balance deficits, cognitive deficits, weakness, decreased activity tolerance, and visual deficits. Pt with prominent L head lean in sitting and L lean in standing, requiring constant support to maintain midline. He is also experiencing visual "blackouts" with position changes, in this session in particular they occurred with R head turns and looking to the R side. Recommending SNF to maximize pt's independence and safety. OT will follow acutely.   ?   ? ?Recommendations for follow up therapy are one component of a multi-disciplinary discharge planning process, led by the attending physician.  Recommendations may be updated based on patient status, additional functional criteria and insurance authorization.  ? ?Follow Up Recommendations ? Skilled nursing-short term rehab (<3 hours/day)  ?  ?Assistance Recommended at Discharge Frequent or constant Supervision/Assistance  ?Patient can return home with the following A lot of help with walking and/or transfers;A lot of help with bathing/dressing/bathroom;Assistance with cooking/housework;Direct supervision/assist for medications management;Direct supervision/assist for financial management;Assist for transportation;Help with stairs or ramp  for entrance ? ?  ?Functional Status Assessment ? Patient has had a recent decline in their functional status and demonstrates the ability to make significant improvements in function in a reasonable and predictable amount of time.  ?Equipment Recommendations ? Other (comment) (TBD)  ?  ?Recommendations for Other Services   ? ? ?  ?Precautions / Restrictions Precautions ?Precautions: Fall ?Precaution Comments: Nausea/"blackouts" during some movement ?Restrictions ?Weight Bearing Restrictions: No  ? ?  ? ?Mobility Bed Mobility ?Overal bed mobility: Needs Assistance ?Bed Mobility: Supine to Sit, Sit to Supine ?  ?  ?Supine to sit: Mod assist ?Sit to supine: Mod assist ?  ?General bed mobility comments: Mod A for LE management, truncal support, and multimodal cuing ?  ? ?Transfers ?Overall transfer level: Needs assistance ?Equipment used: Rolling walker (2 wheels) ?Transfers: Sit to/from Stand ?Sit to Stand: Mod assist ?  ?  ?  ?  ?  ?General transfer comment: Mod A for power up, correction of L sided lean, and multimodal processing cues. ?  ? ?  ?Balance Overall balance assessment: Needs assistance ?Sitting-balance support: Feet supported ?Sitting balance-Leahy Scale: Fair ?Sitting balance - Comments: Promanant L head lean ?Postural control: Left lateral lean ?Standing balance support: Bilateral upper extremity supported ?Standing balance-Leahy Scale: Poor ?Standing balance comment: Requires constant support and cuing to correct L sided lean to maintain standing balance. ?  ?  ?  ?  ?  ?  ?  ?  ?  ?  ?  ?   ? ?ADL either performed or assessed with clinical judgement  ? ?ADL Overall ADL's : Needs assistance/impaired ?Eating/Feeding: Set up;Sitting ?  ?Grooming: Set up;Sitting ?  ?Upper Body Bathing: Minimal assistance;Sitting ?  ?Lower Body Bathing: Maximal assistance;Sitting/lateral leans;Sit to/from stand ?  ?Upper Body Dressing :  Minimal assistance;Sitting ?  ?Lower Body Dressing: Maximal assistance;Sitting/lateral  leans;Sit to/from stand ?  ?Toilet Transfer: Moderate assistance;Stand-pivot ?  ?Toileting- Clothing Manipulation and Hygiene: Maximal assistance;Sit to/from stand;Sitting/lateral lean ?  ?  ?  ?  ?General ADL Comments: Pt limited in LB ADL's due to poor dynamic balance  ? ? ? ?Vision Baseline Vision/History: 1 Wears glasses ?Ability to See in Adequate Light: 0 Adequate ?Patient Visual Report: Nausea/blurring vision with head movement ?Vision Assessment?: Yes ?Eye Alignment: Within Functional Limits ?Ocular Range of Motion: Within Functional Limits ?Alignment/Gaze Preference: Within Defined Limits ?Tracking/Visual Pursuits: Able to track stimulus in all quads without difficulty;Impaired - to be further tested in functional context (initially pt reports that his vision went black for 10-15 secs when he looked to the R side) ?Visual Fields: Left superior homonymous quadranopsia;Left visual field deficit ?Additional Comments: Pt having "blackout" vision in both eyes sometimes with positional changes  ?   ?Perception   ?  ?Praxis   ?  ? ?Pertinent Vitals/Pain Pain Assessment ?Pain Assessment: No/denies pain  ? ? ? ?Hand Dominance Right ?  ?Extremity/Trunk Assessment Upper Extremity Assessment ?Upper Extremity Assessment: Generalized weakness (LUE mildly weaker than RUE) ?  ?Lower Extremity Assessment ?Lower Extremity Assessment: Defer to PT evaluation ?  ?Cervical / Trunk Assessment ?Cervical / Trunk Assessment: Kyphotic ?  ?Communication Communication ?Communication: HOH ?  ?Cognition Arousal/Alertness: Awake/alert ?Behavior During Therapy: Flat affect ?Overall Cognitive Status: Impaired/Different from baseline ?Area of Impairment: Orientation, Attention, Memory, Following commands, Safety/judgement, Awareness, Problem solving ?  ?  ?  ?  ?  ?  ?  ?  ?Orientation Level: Place ?Current Attention Level: Sustained ?Memory: Decreased recall of precautions, Decreased short-term memory ?Following Commands: Follows multi-step  commands inconsistently, Follows one step commands with increased time ?Safety/Judgement: Decreased awareness of safety, Decreased awareness of deficits ?Awareness: Emergent ?Problem Solving: Slow processing, Decreased initiation, Difficulty sequencing, Requires verbal cues, Requires tactile cues ?General Comments: Pt. requires increased multimodal cuing for all functional activites. Slowed processing and skewed sense of vertical when sitting. ?  ?  ?General Comments  VSS on RA, pt had 2 "blackout" vision episodes when turning to look towards the R side ? ?  ?Exercises   ?  ?Shoulder Instructions    ? ? ?Home Living Family/patient expects to be discharged to:: Private residence ?Living Arrangements: Spouse/significant other ?Available Help at Discharge: Available 24 hours/day;Family ?Type of Home: Mobile home ?Home Access: Ramped entrance ?  ?  ?Home Layout: One level ?  ?  ?Bathroom Shower/Tub: Walk-in shower ?  ?Bathroom Toilet: Standard ?Bathroom Accessibility: Yes ?How Accessible: Accessible via walker ?Home Equipment: Conservation officer, nature (2 wheels);Shower seat ?  ?  ?  ? ?  ?Prior Functioning/Environment Prior Level of Function : Independent/Modified Independent ?  ?  ?  ?  ?  ?  ?Mobility Comments: Doesn't drive ?ADLs Comments: independent, wife does shopping ?  ? ?  ?  ?OT Problem List: Decreased strength;Decreased activity tolerance;Impaired balance (sitting and/or standing);Impaired vision/perception;Decreased cognition;Decreased safety awareness;Impaired UE functional use ?  ?   ?OT Treatment/Interventions: Self-care/ADL training;Therapeutic exercise;Energy conservation;DME and/or AE instruction;Therapeutic activities;Visual/perceptual remediation/compensation;Patient/family education;Balance training;Cognitive remediation/compensation  ?  ?OT Goals(Current goals can be found in the care plan section) Acute Rehab OT Goals ?Patient Stated Goal: To get stronger ?OT Goal Formulation: With patient/family ?Time For  Goal Achievement: 12/07/21 ?Potential to Achieve Goals: Good ?ADL Goals ?Pt Will Perform Lower Body Bathing: with min assist;sitting/lateral leans;sit to/from stand ?Pt Will Perform Lower Body Dressing

## 2021-11-23 NOTE — Evaluation (Signed)
Speech Language Pathology Evaluation ?Patient Details ?Name: Bernard Ramirez ?MRN: [id number] ?DOB: 1931/08/16 ?Today's Date: 11/23/2021 ?Time: 6734-1937 ?SLP Time Calculation (min) (ACUTE ONLY): 32 min ? ?Problem List:  ?Patient Active Problem List  ? Diagnosis Date Noted  ? Acute ischemic stroke ([place]) 11/22/2021  ? Hypothermia 11/22/2021  ? Sinus bradycardia 11/22/2021  ? GERD without esophagitis 02/18/2021  ? Hypokalemia 02/18/2021  ? Hyperlipidemia LDL goal <100 02/18/2021  ? Hypertensive heart disease with congestive heart failure ([place]) 02/18/2021  ? Snoring 02/08/2021  ? Failure to thrive in adult   ? Cellulitis 02/01/2021  ? Social problem 02/01/2021  ? Protein calorie malnutrition ([place]) 02/01/2021  ? Anxiety 02/01/2021  ? Congestive heart failure (CHF) ([place]) 10/08/2016  ? AKI (acute kidney injury) ([place]) 10/08/2016  ? Acute renal insufficiency due to procedure 04/13/2016  ? BPH (benign prostatic hypertrophy) with urinary obstruction 04/11/2016  ? Shortness of breath 09/27/2015  ? Absolute anemia   ? ED (erectile dysfunction) of organic origin 11/06/2014  ? Benign non-nodular prostatic hyperplasia with lower urinary tract symptoms 11/06/2014  ? Increased frequency of urination 10/12/2014  ? Post-traumatic bulbous urethral stricture 10/12/2014  ? Atrial fibrillation by electrocardiogram ([place]) 11/08/2012  ? Former smoker 11/08/2012  ? Chronic kidney disease, stage II (mild) 07/11/2012  ? Vertigo   ? Permanent atrial fibrillation ([place])   ? Anemia 06/10/2009  ? Essential hypertension 06/10/2009  ? Back pain 06/10/2009  ? DIARRHEA, CHRONIC 06/10/2009  ? DIVERTICULITIS, HX OF 06/10/2009  ? ?Past Medical History:  ?Past Medical History:  ?Diagnosis Date  ? Anemia   ? 04/2012: H&H-10.2/31.5, MCV-77; 06/2012:12/38.9  ? Arthritis   ? Benign prostatic hypertrophy   ? s/p transurethral resection of the prostate  ? Chest pain   ? 2004; with dyspnea, stress nuclear-normal EF + questionable inferior wall ischemia, normal coronary  angiography;  2010-negative stress nuclear  ? CVA (cerebral infarction)   ? asymptomatic (seen on CT)  ? Diarrhea   ? h/o Hemoccult-positive stool  ? Dysrhythmia   ? Edema of both legs   ? GERD (gastroesophageal reflux disease)   ? History of blood transfusion   ? History of kidney stones   ? History of urinary tract infection   ? Hypertension   ? Jerking movements of extremities   ? left arm   ? Low back pain   ? Obstructive sleep apnea   ? pt states can not use the CPAP  ? Peripheral neuropathy   ? lower legs and feet bilat   ? Persistent atrial fibrillation ([place]) 2013  ? Asymptomatic; diagnosed in 05/2012  ? Pneumonia   ? Prostate cancer ([place]) 2017  ? stage 4  ? Shortness of breath dyspnea   ? pt states only develops SOB if tries to do something too quickly  ? Upper GI bleed 1985  ? 1985-peptic ulcer disease; initial hemigastrectomy; subsequent Billroth II  ? Urinary hesitancy   ? Vertigo   ? ED evaluation-06/2012  ? ?Past Surgical History:  ?Past Surgical History:  ?Procedure Laterality Date  ? Billroth II    ? CHOLECYSTECTOMY    ? COLONOSCOPY  Approximately 2000  ? COLONOSCOPY  10/2012  ? [facility] March [facility], difficulty exam requiring fluoroscopy and overtube. Patient developed severe bradycardia again during his procedure like he did [facility]. Multiple polyps removed which were tubular adenomas. Previously tattooed sites at 20 and 30 cm were free of any polypoid change. Left-sided diverticulosis noted.  ? COLONOSCOPY WITH ESOPHAGOGASTRODUODENOSCOPY (  EGD)  06-17-2009  ? [id number] tubular adenomas and 2 tubulovillous adenomas, incomplete, 3 clips placed  ? ESOPHAGOGASTRODUODENOSCOPY  06/17/2009  ? SLF: normal/chronic gastritis (non-h pylori)  ? ESOPHAGOGASTRODUODENOSCOPY N/A 05/11/2015  ? [id number] anemai due to postgastrectomy state/moderate erosive gastritis  ? INGUINAL HERNIA REPAIR    ? Left  ? [place]  ? Bilateral  ? TRANSURETHRAL RESECTION OF PROSTATE  06/2016  ?  TRANSURETHRAL RESECTION OF PROSTATE N/A 12/27/2012  ? Procedure: TRANSURETHRAL RESECTION OF THE PROSTATE (TURP);  Surgeon: Marissa Nestle, MD;  Location: [facility];  Service: Urology;  Laterality: N/A;  ? TRANSURETHRAL RESECTION OF PROSTATE N/A 04/11/2016  ? Procedure: TRANSURETHRAL RESECTION OF THE PROSTATE (TURP);  Surgeon: Irine Seal, MD;  Location: [facility];  Service: Urology;  Laterality: N/A;  ? VAGOTOMY AND PYLOROPLASTY  1970s  ? Details of procedure uncertain; 1884Z  ? ?HPI:  ?Pt is an 88 y.o. male was brought into the ED due to weakness and confusion. MRI brain 4/4: Scattered acute infarctions within the inferior to mid cerebellum on  the right consistent with infarction in the right PICA territory. PMH: permanent atrial fibrillation, not on any anticoagulation but only on Plavix, BPH, hypertension.  ? ?Assessment / Plan / Recommendation ?Clinical Impression ? Pt participated in speech-language-cognition evaluation with his wife present for part of the evaluation. 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 Per the pt, there was an acute worsening of his cognition over the past few days and he is not currently at baseline. She stated that she typically manages the pt's medications and finances. Motor speech and language skills were [facility]. The [facility] Mental Status Examination was completed to evaluate the pt's cognitive-linguistic skills. He achieved a score of 3/30 which is below the normal limits of 27 or more out of 30. He exhibited deficits in the areas of orientation, attention, memory, problem solving, and executive function. However, the amount of effort that the pt placed in responding to questions was also suboptimal despite encouragement. Skilled SLP services are clinically indicated at this time to improve cognitive-linguistic function. ?   ?SLP  Assessment ? SLP Recommendation/Assessment: Patient needs continued [facility] ?SLP Visit Diagnosis: Cognitive communication deficit (R41.841)  ?  ?Recommendations for follow up therapy are one component of a multi-disciplinary discharge planning process, led by the attending physician.  Recommendations may be updated based on patient status, additional functional criteria and insurance authorization. ?   ?Follow Up Recommendations ? Skilled nursing-short term rehab (<3 hours/day)  ?  ?Assistance Recommended at Discharge ? Frequent or constant Supervision/Assistance  ?Functional Status Assessment Patient has had a recent decline in their functional status and demonstrates the ability to make significant improvements in function in a reasonable and predictable amount of time.  ?Frequency and Duration min 2x/week  ?2 weeks ?  ?   ?SLP Evaluation ?Cognition ? Overall Cognitive Status: Impaired/Different from baseline ?Arousal/Alertness: Awake/alert ?Orientation Level: Oriented to person;Disoriented to situation;Disoriented to time ?Year: 2023 ?Day of Week: Incorrect ?Attention: Focused;Sustained ?Focused Attention: Impaired ?Focused Attention Impairment: Verbal complex ?Sustained Attention: Impaired ?Sustained Attention Impairment: Verbal complex ?Memory: Impaired ?Memory Impairment: Retrieval deficit;Storage deficit;Decreased short term memory (Immediate: 4/5 with repetition; delayed: 0/5; with cues: 2/5) ?Executive Function: Sequencing;Organizing ?Sequencing: Impaired ?Sequencing Impairment: Verbal complex (clock drawing: 0/4) ?Organizing: Impaired ?Organizing Impairment: Verbal  complex (backward digit: 0/2)  ?  ?   ?Comprehension ? Auditory Comprehension ?Overall Auditory Comprehension: Appears within functional limits for tasks assessed ?Yes/No Questions: Within Functional Limits ?Commands: Within Functional Limits ?Conversation: Complex  ?  ?Expression Expression ?Primary Mode of  Expression: Verbal ?Verbal Expression ?Overall Verbal Expression: Appears within functional limits for tasks assessed ?Initiation: No impairment ?Level of Generative/Spontaneous Verbalization: Conversation ?Repetition:

## 2021-11-24 DIAGNOSIS — Z7189 Other specified counseling: Secondary | ICD-10-CM | POA: Diagnosis not present

## 2021-11-24 DIAGNOSIS — I4821 Permanent atrial fibrillation: Secondary | ICD-10-CM | POA: Diagnosis not present

## 2021-11-24 DIAGNOSIS — N179 Acute kidney failure, unspecified: Secondary | ICD-10-CM | POA: Diagnosis not present

## 2021-11-24 DIAGNOSIS — T68XXXA Hypothermia, initial encounter: Secondary | ICD-10-CM | POA: Diagnosis not present

## 2021-11-24 DIAGNOSIS — E785 Hyperlipidemia, unspecified: Secondary | ICD-10-CM | POA: Diagnosis not present

## 2021-11-24 DIAGNOSIS — Z515 Encounter for palliative care: Secondary | ICD-10-CM | POA: Diagnosis not present

## 2021-11-24 DIAGNOSIS — I639 Cerebral infarction, unspecified: Secondary | ICD-10-CM | POA: Diagnosis not present

## 2021-11-24 LAB — HEMOGLOBIN A1C
Hgb A1c MFr Bld: 6.3 % — ABNORMAL HIGH (ref 4.8–5.6)
Mean Plasma Glucose: 134 mg/dL

## 2021-11-24 LAB — BASIC METABOLIC PANEL
Anion gap: 5 (ref 5–15)
BUN: 15 mg/dL (ref 8–23)
CO2: 25 mmol/L (ref 22–32)
Calcium: 9 mg/dL (ref 8.9–10.3)
Chloride: 107 mmol/L (ref 98–111)
Creatinine, Ser: 1.07 mg/dL (ref 0.61–1.24)
GFR, Estimated: >60 mL/min (ref >60)
Glucose, Bld: 137 mg/dL — ABNORMAL HIGH (ref 70–99)
Potassium: 4 mmol/L (ref 3.5–5.1)
Sodium: 137 mmol/L (ref 135–145)

## 2021-11-24 LAB — MAGNESIUM: Magnesium: 2.1 mg/dL (ref 1.7–2.4)

## 2021-11-24 LAB — CBC
HCT: 33.3 % — ABNORMAL LOW (ref 39.0–52.0)
Hemoglobin: 10 g/dL — ABNORMAL LOW (ref 13.0–17.0)
MCH: 24.6 pg — ABNORMAL LOW (ref 26.0–34.0)
MCHC: 30 g/dL (ref 30.0–36.0)
MCV: 82 fL (ref 80.0–100.0)
Platelets: 149 10*3/uL — ABNORMAL LOW (ref 150–400)
RBC: 4.06 MIL/uL — ABNORMAL LOW (ref 4.22–5.81)
RDW: 16.9 % — ABNORMAL HIGH (ref 11.5–15.5)
WBC: 6.1 10*3/uL (ref 4.0–10.5)
nRBC: 0 % (ref 0.0–0.2)

## 2021-11-24 LAB — PHOSPHORUS: Phosphorus: 3.4 mg/dL (ref 2.5–4.6)

## 2021-11-24 MED ORDER — ENOXAPARIN SODIUM 60 MG/0.6ML IJ SOSY
50.0000 mg | PREFILLED_SYRINGE | INTRAMUSCULAR | Status: DC
  Administered 2021-11-24 – 2021-11-27 (×4): 50 mg via SUBCUTANEOUS
  Filled 2021-11-24 (×4): qty 0.6

## 2021-11-24 NOTE — Progress Notes (Signed)
Physical Therapy Treatment ?Patient Details ?Name: Bernard Ramirez ?MRN: [id number] ?DOB: [birth year] ?Today's Date: 11/24/2021 ? ? ?History of Present Illness Pt. is an 88 y.o. M presenting to [facility] on 4/4 due to weakness while complaining of N/V. Wife noted significant weakness, lethargy, and drowsiness for previous few days. MRI shows multiple infarctions in mid cerebellum in the R PICA area. PMH significant for A-fib, BPH and history of stage IV prostate cancer 2017, peripheral neuropathy, and HTN. ? ?  ?PT Comments  ? ? Pt more alert and aware this session vs yesterday, and is more aware of L lateral bias during mobility. Pt tolerated repeated sit<>stands this session with dynamic standing tasks to address balance deficits (standing marches, correcting L bias). Disposition remains appropriate, PT to continue to follow. ? ?   ?Recommendations for follow up therapy are one component of a multi-disciplinary discharge planning process, led by the attending physician.  Recommendations may be updated based on patient status, additional functional criteria and insurance authorization. ? ?Follow Up Recommendations ? Skilled nursing-short term rehab (<3 hours/day) ?  ?  ?Assistance Recommended at Discharge Frequent or constant Supervision/Assistance  ?Patient can return home with the following A lot of help with walking and/or transfers;A lot of help with bathing/dressing/bathroom;Assist for transportation;Direct supervision/assist for financial management;Help with stairs or ramp for entrance;Direct supervision/assist for medications management ?  ?Equipment Recommendations ? None recommended by PT  ?  ?Recommendations for Other Services   ? ? ?  ?Precautions / Restrictions Precautions ?Precautions: Fall ?Restrictions ?Weight Bearing Restrictions: No  ?  ? ?Mobility ? Bed Mobility ?Overal bed mobility: Needs Assistance ?Bed Mobility: Supine to Sit ?  ?  ?Supine to sit: Mod assist, HOB elevated ?  ?  ?General bed mobility  comments: assist for trunk elevation, LE lowering over EOB, max sequencing cues. ?  ? ?Transfers ?Overall transfer level: Needs assistance ?Equipment used: Rolling walker (2 wheels) ?Transfers: Sit to/from Stand ?Sit to Stand: Mod assist, From elevated surface ?  ?Step pivot transfers: Min assist, From elevated surface ?  ?  ?  ?General transfer comment: assist for power up, rise, steadying, and eccentric lower assist at trunk when moving stand>sit. Min assist for stand pivot to recliner towards L for steadying and RW management. ?  ? ?Ambulation/Gait ?  ?  ?  ?  ?  ?  ?Pre-gait activities: standing marching 3x10 each sit<>stand ?General Gait Details: nt ? ? ?Stairs ?  ?  ?  ?  ?  ? ? ?Wheelchair Mobility ?  ? ?Modified Rankin (Stroke Patients Only) ?Modified Rankin (Stroke Patients Only) ?Pre-Morbid Rankin Score: Slight disability ?Modified Rankin: Moderately severe disability ? ? ?  ?Balance Overall balance assessment: Needs assistance ?Sitting-balance support: Feet supported ?Sitting balance-Leahy Scale: Fair ?  ?Postural control: Left lateral lean ?Standing balance support: Bilateral upper extremity supported ?Standing balance-Leahy Scale: Poor ?Standing balance comment: Requires constant support and cuing to correct L sided lean to maintain standing balance. ?  ?  ?  ?  ?  ?  ?  ?  ?  ?  ?  ?  ? ?  ?Cognition Arousal/Alertness: Awake/alert ?Behavior During Therapy: Flat affect ?Overall Cognitive Status: Impaired/Different from baseline ?Area of Impairment: Attention, Memory, Following commands, Safety/judgement, Awareness, Problem solving, Orientation ?  ?  ?  ?  ?  ?  ?  ?  ?Orientation Level: Situation, Disoriented to ?Current Attention Level: Selective ?Memory: Decreased recall of precautions, Decreased short-term memory ?Following Commands: Follows multi-step commands  inconsistently, Follows one step commands with increased time ?Safety/Judgement: Decreased awareness of safety, Decreased awareness of  deficits ?Awareness: Emergent ?Problem Solving: Slow processing, Decreased initiation, Difficulty sequencing, Requires verbal cues, Requires tactile cues ?General Comments: requires sequencing cues throughout mobility, is initially drowsy but wakes with sitting EOB. More aware of L lateral bias today, no knowledge that he has had a CVA ?  ?  ? ?  ?Exercises   ? ?  ?General Comments General comments (skin integrity, edema, etc.): skin tear to R elbow when pt transitioned stand>sit, safety zone completed, RN came to room to assess, measure 1x2 in, and apply mepilex ?  ?  ? ?Pertinent Vitals/Pain Pain Assessment ?Pain Assessment: No/denies pain  ? ? ?Home Living   ?  ?  ?  ?  ?  ?  ?  ?  ?  ?   ?  ?Prior Function    ?  ?  ?   ? ?PT Goals (current goals can now be found in the care plan section) Acute Rehab PT Goals ?Patient Stated Goal: To improve walking and strength to return home ?PT Goal Formulation: With patient ?Time For Goal Achievement: 12/07/21 ?Potential to Achieve Goals: Fair ?Progress towards PT goals: Progressing toward goals ? ?  ?Frequency ? ? ? Min 3X/week ? ? ? ?  ?PT Plan Current plan remains appropriate  ? ? ?Co-evaluation   ?  ?  ?  ?  ? ?  ?AM-PAC PT "6 Clicks" Mobility   ?Outcome Measure ? Help needed turning from your back to your side while in a flat bed without using bedrails?: A Little ?Help needed moving from lying on your back to sitting on the side of a flat bed without using bedrails?: A Lot ?Help needed moving to and from a bed to a chair (including a wheelchair)?: A Lot ?Help needed standing up from a chair using your arms (e.g., wheelchair or bedside chair)?: A Lot ?Help needed to walk in hospital room?: A Lot ?Help needed climbing 3-5 steps with a railing? : Total ?6 Click Score: 12 ? ?  ?End of Session   ?Activity Tolerance: Patient tolerated treatment well ?Patient left: in chair;with call bell/phone within reach;with chair alarm set;with family/visitor present ?Nurse Communication:  Mobility status ?PT Visit Diagnosis: Unsteadiness on feet (R26.81);Muscle weakness (generalized) (M62.81);Difficulty in walking, not elsewhere classified (R26.2) ?  ? ? ?Time: 1534-1600 ?PT Time Calculation (min) (ACUTE ONLY): 26 min ? ?Charges:  $Therapeutic Activity: 8-22 mins ?$Neuromuscular Re-education: 8-22 mins          ?          ? ?Stacie Glaze, PT DPT ?Acute Rehabilitation Services ?Pager [phone]  ?Office [phone] ? ? ? ?[place] [address] [place] ?11/24/2021, 4:51 PM ? ?

## 2021-11-24 NOTE — TOC Progression Note (Addendum)
Transition of Care (TOC) - Progression Note  ? ? ?Patient Details  ?Name: Bernard Ramirez ?MRN: [id number] ?Date of Birth: 10-08-1930 ? ?Transition of Care (TOC) CM/SW Contact  ?Coralee Pesa, LCSWA ?Phone Number: ?11/24/2021, 11:16 AM ? ?Clinical Narrative:    ?Spouse returned call and additional bed offers were given. Spouse noted she is currently deciding between [facility] and [facility]. TOC will continue to follow for bed choice. ?CSW reached out to [name], Pt's spouse to discuss bed offers that were emailed yesterday. A voicemail was left requesting a return call. TOC will continue to follow for DC needs. ? ? ?Expected Discharge Plan: [place] ?Barriers to Discharge: Continued Medical Work up, Ship broker ? ?Expected Discharge Plan and Services ?Expected Discharge Plan: [place] ?  ?  ?Post Acute Care Choice: [place] ?Living arrangements for the past 2 months: [place] ?                ?  ?  ?  ?  ?  ?  ?  ?  ?  ?  ? ? ?Social Determinants of Health (SDOH) Interventions ?  ? ?Readmission Risk Interventions ? ?  02/02/2021  ?  2:10 PM  ?Readmission Risk Prevention Plan  ?Medication Screening Complete  ?Transportation Screening Complete  ? ? ?

## 2021-11-24 NOTE — Plan of Care (Signed)
Pt resting no distress noted. IV fluids continued per order. Pt has been turned and repositioned. External foley catheter in place. Has been changed. Pt has dentures, taken out to clean and reinserted. Oral care provided. Denies pain.  ? ?Problem: Education: ?Goal: Knowledge of General Education information will improve ?Description: Including pain rating scale, medication(s)/side effects and non-pharmacologic comfort measures ?Outcome: Progressing ?  ?Problem: Health Behavior/Discharge Planning: ?Goal: Ability to manage health-related needs will improve ?Outcome: Progressing ?  ?Problem: Clinical Measurements: ?Goal: Ability to maintain clinical measurements within normal limits will improve ?Outcome: Progressing ?Goal: Will remain free from infection ?Outcome: Progressing ?Goal: Diagnostic test results will improve ?Outcome: Progressing ?Goal: Respiratory complications will improve ?Outcome: Progressing ?Goal: Cardiovascular complication will be avoided ?Outcome: Progressing ?  ?Problem: Activity: ?Goal: Risk for activity intolerance will decrease ?Outcome: Progressing ?  ?Problem: Nutrition: ?Goal: Adequate nutrition will be maintained ?Outcome: Progressing ?  ?Problem: Coping: ?Goal: Level of anxiety will decrease ?Outcome: Progressing ?  ?Problem: Elimination: ?Goal: Will not experience complications related to bowel motility ?Outcome: Progressing ?Goal: Will not experience complications related to urinary retention ?Outcome: Progressing ?  ?Problem: Pain Managment: ?Goal: General experience of comfort will improve ?Outcome: Progressing ?  ?Problem: Safety: ?Goal: Ability to remain free from injury will improve ?Outcome: Progressing ?  ?Problem: Skin Integrity: ?Goal: Risk for impaired skin integrity will decrease ?Outcome: Progressing ?  ?

## 2021-11-24 NOTE — Progress Notes (Signed)
?PROGRESS NOTE ? ? ? ?Bernard Ramirez  ZOX:[id number] DOB: Nov 24, 1930 DOA: 11/22/2021 ?PCP: [name], MD  ? ? ?Brief Narrative:  ?Patient is 88 years old male with past medical history of CVA, prostate cancer and atrial fibrillation not on anticoagulation but on Plavix, BPH, hypertension presented to hospital with weakness lethargy and excessive somnolence for few days prior to presentation.  In the ED, patient was hypothermic and bradycardic.  MRI of the brain and MR angiogram of the head showed multiple infarcts in mainly in the inferior to mid cerebellar region on the right in the PICA territory.  He was then admitted hospital for further evaluation and treatment ? ?Assessment and plan ? ?Acute ischemic CVA:  ?Patient with a large cerebellar stroke and extensive old subcortical and cortical infarcts.   on Plavix.  On permissive hypertension.  Continue statins.  MRI of the brain, MRA of the neck shows a cerebellar infarct right PICA territory infarct., 2D echocardiogram with  LV ejection fraction at 50 to 55% with global hypokinesis and mild LVH. PT OT seen the patient and recommended skilled nursing facility.  Continue telemetry monitoring.  Neurology on board.  hemoglobin A1c at 6.3..  Lipid panel with LDL of 107.  Aspiration precautions. ? ?Metabolic encephalopathy could be secondary to stroke.    Blood cultures negative in 2 days.  Lactate was 1.2.  COVID and influenza was negative.  Urinalysis was negative.  No leukocytosis or fever.  Improved today. ? ?Essential hypertension:  ?We will continue to to hold antihypertensives from home.  AOn high-dose Lasix at home currently on hold. ?  ?AKI:  ?Mild could be secondary to volume depletion.  Lasix on hold at this time.  Received IV fluids..  We will discontinue IV fluids. ?  ?GERD: Continue PPI. ?  ?Permanent atrial fibrillation:  ?on Plavix.  Unclear why the patient is not on anticoagulation. ?  ?Nausea/vomiting: No further reported.   ? ?Hypothermia:  ?On  presentation.  Received Retail banker.  Improved at this time. ? ?Bradycardia: Could be secondary to hypothermia.  Improved at this time.  Avoid nodal blockers. ? ?Goals of care.  Patient has overall guarded prognosis.  palliative care consulted.  ? ? ? DVT prophylaxis:   Lovenox subcu ? ? ?Code Status:   ?  Code Status: DNR ? ?Disposition: Skilled nursing facility as Per PT recommendation. ? ?Status is: Inpatient ? ?The patient will require care spanning > 2 midnights and should be moved to inpatient because: Large stroke, encephalopathy, further work-up, need for rehabilitation. ? ? Family Communication:  ?I again tried to reach the patient's spouse on the cell phone and home phone but was unable to reach her. ? ?Consultants:  ?Neurology ?Palliative care ? ?Procedures:  ?None ? ?Antimicrobials:  ?None ? ? ?Subjective: ?Today, patient was seen and examined at bedside.  Appears to be more alert awake and communicative today.  Oriented to place.  Denies any pain, nausea, shortness of breath.  ? ?Objective: ?Vitals:  ? 11/23/21 1939 11/23/21 2312 11/24/21 0354 11/24/21 0755  ?BP: 125/71 (!) 131/58 129/71 136/71  ?Pulse: 72 64 (!) 59 (!) 53  ?Resp: '17 15 17 20  '$ ?Temp: 98.7 ?F (37.1 ?C) 99.6 ?F (37.6 ?C) 98.6 ?F (37 ?C) 98.7 ?F (37.1 ?C)  ?TempSrc: Oral Oral Oral Oral  ?SpO2: 98% 91% 94% 96%  ?Weight:      ?Height:      ? ? ?Intake/Output Summary (Last 24 hours) at 11/24/2021 1004 ?Last  data filed at 11/24/2021 0901 ?Gross per 24 hour  ?Intake 2189.46 ml  ?Output 500 ml  ?Net 1689.46 ml  ? ?Filed Weights  ? 11/22/21 0841  ?Weight: 105 kg  ? ? ?Physical Examination: ? ?General:  Average built, not in obvious distress, more alert awake and communicative today ?HENT:   No scleral pallor or icterus noted. Oral mucosa is moist.  ?Chest:  Clear breath sounds.  Diminished breath sounds bilaterally.  ?CVS: S1 &S2 heard. No murmur.  Regular rate and rhythm. ?Abdomen: Soft, nontender, nondistended.  Bowel sounds are heard.    ?Extremities: No cyanosis, clubbing with mild edema.  Peripheral pulses are palpable. ?Psych: Alert, awake and oriented to place, communicative ?CNS: Mild left facial weakness, right upper extremity mild drift ?Skin: Warm and dry.  No rashes noted. ? ? ?Data Reviewed:  ? ?CBC: ?Recent Labs  ?Lab 11/22/21 ?2423 11/24/21 ?0114  ?WBC 8.0 6.1  ?NEUTROABS 5.8  --   ?HGB 11.4* 10.0*  ?HCT 38.5* 33.3*  ?MCV 83.7 82.0  ?PLT 203 149*  ? ? ?Basic Metabolic Panel: ?Recent Labs  ?Lab 11/22/21 ?5361 11/24/21 ?0114  ?NA 139 137  ?K 3.8 4.0  ?CL 102 107  ?CO2 26 25  ?GLUCOSE 204* 137*  ?BUN 23 15  ?CREATININE 1.28* 1.07  ?CALCIUM 8.9 9.0  ?MG  --  2.1  ?PHOS  --  3.4  ? ? ?Liver Function Tests: ?Recent Labs  ?Lab 11/22/21 ?0845  ?AST 25  ?ALT 12  ?ALKPHOS 91  ?BILITOT 1.5*  ?PROT 7.2  ?ALBUMIN 4.0  ? ? ? ?Radiology Studies: ?MR ANGIO HEAD WO CONTRAST ? ?Result Date: 11/22/2021 ?CLINICAL DATA:  Dizziness, persistent/recurrent, cardiac or vascular cause suspected. EXAM: MRI HEAD WITHOUT CONTRAST MRA HEAD WITHOUT CONTRAST TECHNIQUE: Multiplanar, multi-echo pulse sequences of the brain and surrounding structures were acquired without intravenous contrast. Angiographic images of the Circle of Willis were acquired using MRA technique without intravenous contrast. COMPARISON:  Head CT 06/16/2021.  MRI 10/15/2015. FINDINGS: MRI HEAD FINDINGS Brain: Diffusion imaging shows numerous scattered acute infarctions within the inferior to mid cerebellum on the right. No mass effect or visible hemorrhage. Cerebral hemispheres show old infarctions in both occipital regions, the inferior left frontal lobe, and extensively throughout the cerebral hemispheric white matter. No mass lesion, hemorrhage, hydrocephalus or extra-axial collection. Vascular: Major vessels at the base of the brain show flow. Skull and upper cervical spine: Negative Sinuses/Orbits: Clear/normal Other: None MRA HEAD FINDINGS Anterior circulation: Both internal carotid arteries  are patent through the skull base and siphon regions without stenosis. The anterior and middle cerebral vessels are patent. No proximal stenosis, aneurysm or vascular malformation. Posterior circulation: Both vertebral arteries widely patent to the basilar. No visible right PICA flow. No basilar stenosis. Other posterior circulation branch vessels show flow presently. Distal branch vessels not evaluated by this technique. Anatomic variants: None significant. IMPRESSION: Scattered acute infarctions within the inferior to mid cerebellum on the right consistent with infarction in the right PICA territory. No visible PICA flow on the right by MR angiography, though resolution is limited. No evidence of hemorrhage or significant mass effect. Old infarctions in both occipital lobes and in the inferior left frontal lobe. Extensive chronic small-vessel ischemic changes throughout the cerebral hemispheric white matter. Electronically Signed   By: Nelson Chimes M.D.   On: 11/22/2021 12:18  ? ?MR BRAIN WO CONTRAST ? ?Result Date: 11/22/2021 ?CLINICAL DATA:  Dizziness, persistent/recurrent, cardiac or vascular cause suspected. EXAM: MRI HEAD WITHOUT CONTRAST MRA  HEAD WITHOUT CONTRAST TECHNIQUE: Multiplanar, multi-echo pulse sequences of the brain and surrounding structures were acquired without intravenous contrast. Angiographic images of the Circle of Willis were acquired using MRA technique without intravenous contrast. COMPARISON:  Head CT 06/16/2021.  MRI 10/15/2015. FINDINGS: MRI HEAD FINDINGS Brain: Diffusion imaging shows numerous scattered acute infarctions within the inferior to mid cerebellum on the right. No mass effect or visible hemorrhage. Cerebral hemispheres show old infarctions in both occipital regions, the inferior left frontal lobe, and extensively throughout the cerebral hemispheric white matter. No mass lesion, hemorrhage, hydrocephalus or extra-axial collection. Vascular: Major vessels at the base of the  brain show flow. Skull and upper cervical spine: Negative Sinuses/Orbits: Clear/normal Other: None MRA HEAD FINDINGS Anterior circulation: Both internal carotid arteries are patent through the skull base and siphon

## 2021-11-24 NOTE — Progress Notes (Addendum)
STROKE TEAM PROGRESS NOTE  ? ?INTERVAL HISTORY ?Bernard Ramirez is a 88 y.o. male with a history of silent CVA, HTN, prostate cancer who presents with cerebellar stroke. He was last normal a few days ago and presented for symptoms of generalized weakness and vertigo. CTH was negative for acute abnormality.[facility] MRI-Brain showed scattered acute infarctions within the right inferior to mid-cerebellum and old infarcts in the occipital lobes and inferior frontal lobe.  Echocardiogram showed ejection fraction of 50 to 55% with global hypokinesis.  LDL cholesterol 52 mg percent hemoglobin A1c 6.3. ? ?The patient is seen and examined this morning with no family at bedside. He is lethargic and answers questions with single word answers. He exhibits rational thought process; for instance, he informs [place] that he has an allergy to aspirin.  Vital signs are stable. ? ?Vitals:  ? 11/23/21 2312 11/24/21 0354 11/24/21 0755 11/24/21 1246  ?BP: (!) 131/58 129/71 136/71 128/68  ?Pulse: 64 (!) 59 (!) 53 60  ?Resp: '15 17 20 17  '$ ?Temp: 99.6 ?F (37.6 ?C) 98.6 ?F (37 ?C) 98.7 ?F (37.1 ?C) 98.1 ?F (36.7 ?C)  ?TempSrc: Oral Oral Oral Oral  ?SpO2: 91% 94% 96% 98%  ?Weight:      ?Height:      ? ?CBC:  ?Recent Labs  ?Lab 11/22/21 ?0814 11/24/21 ?0114  ?WBC 8.0 6.1  ?NEUTROABS 5.8  --   ?HGB 11.4* 10.0*  ?HCT 38.5* 33.3*  ?MCV 83.7 82.0  ?PLT 203 149*  ? ?Basic Metabolic Panel:  ?Recent Labs  ?Lab 11/22/21 ?4818 11/24/21 ?0114  ?NA 139 137  ?K 3.8 4.0  ?CL 102 107  ?CO2 26 25  ?GLUCOSE 204* 137*  ?BUN 23 15  ?CREATININE 1.28* 1.07  ?CALCIUM 8.9 9.0  ?MG  --  2.1  ?PHOS  --  3.4  ? ?Lipid Panel:  ?Recent Labs  ?Lab 11/23/21 ?0401  ?CHOL 117  ?TRIG 56  ?HDL 54  ?CHOLHDL 2.2  ?VLDL 11  ?[place] 52  ? ?HgbA1c:  ?Recent Labs  ?Lab 11/23/21 ?0401  ?HGBA1C 6.3*  ? ?Urine Drug Screen: No results for input(s): LABOPIA, COCAINSCRNUR, LABBENZ, AMPHETMU, THCU, LABBARB in the last 168 hours.  ?Alcohol Level No results for input(s): ETH in the last 168  hours. ? ?IMAGING past 24 hours ?No results found. ? ?PHYSICAL EXAM ? ?Physical Exam  ?Constitutional: elderly male, appears sick ?Eyes: No scleral injection ?HENT: No OP obstrucion ?MSK: no joint deformities.  ?Cardiovascular: Normal rate and regular rhythm.  ?Respiratory: Effort normal, non-labored breathing ?GI: Soft.  No distension. There is no tenderness.  ?Skin: WDI ? ?Neuro: ?Mental Status: ?As above ?Cranial Nerves: ?Ocular saccadic dysmetria present on horizontal gaze bilaterally.  No nystagmus.  Extraocular movements are full range ?Motor: ?Tone is normal. Bulk is normal. 5/5 strength was present in all four extremities. ?Sensory: ?Sensation is symmetric to light touch and temperature in the arms and legs. ?Cerebellar: ?HKS intact bilaterally ?Gait deferred ? ? ?ASSESSMENT/PLAN ?Bernard Ramirez is a 88 y.o. male with a history of silent CVA, HTN, prostate cancer who presents with cerebellar stroke. He was last normal a few days ago and presented for symptoms of generalized weakness and vertigo. CTH was negative for acute anomaly. MRI-Brain showed scattered acute infarctions within the right inferior to mid-cerebellum and old infarcts in the occipital lobes and inferior frontal lobe.  ? ?Stroke: scattered acute infarctions within the right inferior to mid-cerebellum and old infarcts in the occipital lobes and inferior frontal lobe. Acute  infarcts are in the PICA territory, likely cryptogenic embolic source. ?Code Stroke CTH: negative for acute anomaly ?MRI: MRI-Brain showed scattered acute infarctions within the right inferior to mid-cerebellum and old infarcts in the occipital lobes and inferior frontal lobe. ?MRA Head: No visible PICA flow on the right by MR angiography, though resolution is limited ?Carotid Doppler pending ?2D Echo EF 50-55%, moderate LAE, severe RV enlargement, RVSP of 72 ?LDL 52 ?HgbA1c 6.3 ?VTE prophylaxis - Lovenox ?   ?Diet  ? Diet Heart Room service appropriate? Yes; Fluid  consistency: Thin  ? ?clopidogrel 75 mg daily prior to admission, now on clopidogrel 75 mg daily. Cannot add ASA given allergy.  ?Will need loop recorder at discharge ?Therapy recommendations:  PT/OT rec SNF ?Disposition:  SNF ? ?Hypertension ?Home meds:  losartan 100 mg  ?Stable ?Permissive hypertension (OK if < 220/120) but gradually normalize in 5-7 days ?Long-term BP goal normotensive ? ?Hyperlipidemia ?Home meds:  pravastatin 20 mg daily, resumed in hospital ?LDL 52, goal < 70 ?Continue statin at discharge ? ?Glucose control ?Home meds:  NA ?HgbA1c 6.3, goal < 7.0 ?CBGs ?Recent Labs  ?  11/23/21 ?0734 11/23/21 ?1140 11/23/21 ?1644  ?GLUCAP 143* 163* 163*  ?  ?SSI ? ?Other Stroke Risk Factors ?Advanced Age >/= 39  ?Cigarette smoker, advised to stop smoking ?ETOH use, advised to drink no more than 2 drink(s) a day ?Obesity, Body mass index is 31.39 kg/m?., BMI >/= 30 associated with increased stroke risk, recommend weight loss, diet and exercise as appropriate  ?Hx CVA ? 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 ?Hospital day # 1 ? ? ?Corky Sox, MD ?PGY-1 ? ?STROKE MD NOTE :  ?I have personally obtained history,examined this patient, reviewed notes, independently viewed imaging studies, participated in medical decision making and plan of care.ROS completed by me personally and pertinent positives fully documented  I have made any additions or clarifications directly to the above note. Agree with note above.  Patient presented with dizziness and vertigo due to right cerebellar infarct likely of embolic cryptogenic etiology.  Recommend ongoing stroke work-up.  Cardiac monitoring.  Check echo results.  May need loop recorder prolonged cardiac monitoring for paroxysmal A-fib at discharge.  Recommend Plavix alone for stroke prevention as patient is allergic to aspirin.  Greater than 50% time during this 50-minute visit was spent on counseling and coordination of care and  discussion of his embolic stroke and need for stroke work-up and answering questions. ? ?Antony Contras, MD ?Medical Director ?[facility] ?Pager: [phone] ?11/24/2021 3:01 PM ? ?To contact Stroke Continuity provider, please refer to [email or web address] ?After hours, contact General Neurology ? ?

## 2021-11-24 NOTE — Plan of Care (Signed)

## 2021-11-24 NOTE — Consult Note (Signed)
? ?                                                                                ?Consultation Note ?Date: 11/24/2021  ? ?Patient Name: Bernard Ramirez  ?DOB: 1931-01-18  MRN: [id number]  Age / Sex: 88 y.o., male  ?PCP: [name], MD ?Referring Physician: Flora Lipps, MD ? ?Reason for Consultation:  goals of care ? ?HPI/Patient Profile: 88 y.o. 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 On presentation he was hypothermic and bradycardic, reporting lethargy, nausea for several days. MRI/MRA revealed large acute cerebellar infarct and chronic occipital and frontal lobe infarctions, chronic small vessel ischemic changes. Palliative medicine consulted for goals of care due to large stroke.  ? ?Primary Decision Maker ?NEXT OF KIN - spouse- [name] ? ?Discussion: ?I reviewed patient's chart including progress notes, labs, imaging, care everywhere.  ?Evaluated patient at bedside.  He was very lethargic, he was able to tell me his name but was not able to engage in any other goals of care discussion.  He told me he was in the kitchen.  He has had an SLP mental status exam eval in which he scored a 3 of 30 which is well below a normal score of 27-he showed deficits in orientation attention memory problem solving and executive function. ?He was seen by palliative in June 2022 at that time-he was significantly deconditioned and weak, his spouse was having a hard time caring for him at home, he was discharged to a nursing facility, a MOST form was completed with the following selections: DNR, comfort measures, open to hospice options, determine use or limitation of antibiotics when infection occurs, IV fluids for defined try  failure at current, uncertain about feeding tube. ? ? ?SUMMARY OF RECOMMENDATIONS- ?-Attempted to call his spouse there was no answer-I left a message requesting return call ?-MOST form is on chart-this needs to be reviewed with spouse ?-Continue current level of care, DNR, no de-escalation at this time but need to discuss with spouse ?-PMT will continue to make attempts to reach spouse for further goals of care discussion and monitor patient for decline, recommend outpatient palliative to also follow after discharge ? ? ?Code Status/Advance Care Planning: ?DNR ? ? ?Prognosis:   ?Unable to determine ? ?Discharge Planning: To Be Determined ? ?Primary Diagnoses: ?Present on Admission: ? Acute ischemic stroke ([place]) ? AKI (acute kidney injury) ([place]) ? Hyperlipidemia LDL goal <100 ? ? ?Review of Systems  ?Unable to perform ROS: Mental status change  ? ?Physical Exam ?Vitals and nursing note reviewed.  ?Constitutional:   ?   General: He is not in acute distress. ?   Appearance: He is ill-appearing.  ?Musculoskeletal:  ?   Comments: Bilateral weakness in upper and lower extremities  ?Skin: ?   Coloration: Skin is pale.  ?Neurological:  ?   Mental Status: He is disoriented.  ?  Comments: Lethargic  ? ? ?Vital Signs: BP 136/71 (BP Location: Right Arm)   Pulse (!) 53   Temp 98.7 ?F (37.1 ?C) (Oral)   Resp 20   Ht 6' (1.829 m)   Wt 105 kg   SpO2 96%   BMI 31.39 kg/m?  ?Pain Scale: 0-10 ?  ?Pain Score: 0-No pain ? ? ?SpO2: SpO2: 96 % ?O2 Device:SpO2: 96 % ?O2 Flow Rate: .  ? ?IO: Intake/output summary:  ?Intake/Output Summary (Last 24 hours) at 11/24/2021 1036 ?Last data filed at 11/24/2021 1011 ?Gross per 24 hour  ?Intake 2646.87 ml  ?Output 500 ml  ?Net 2146.87 ml  ? ? ?LBM: Last BM Date :  (PTA) ?Baseline Weight: Weight: 105 kg ?Most recent weight: Weight: 105 kg     ? ? ?Thank you for this consult. Palliative medicine will continue to follow and assist as needed.  ? ?Signed by: ?Mariana Kaufman, AGNP-C ?Palliative  Medicine ? ?  ?Please contact Palliative Medicine Team phone at [phone] for questions and concerns.  ?For individual provider: See Amion ? ? ? ? ? ? ? ? ? ? ? ? ? ? ?

## 2021-11-24 NOTE — Progress Notes (Incomplete)
STROKE TEAM PROGRESS NOTE  ? ?HISTORY OF PRESENT ILLNESS (per record) ?*** ? ? ?INTERVAL HISTORY ?His *** is at the bedside.  *** ? ? ? ?OBJECTIVE ?Vitals:  ? 11/23/21 1502 11/23/21 1939 11/23/21 2312 11/24/21 0354  ?BP: 138/72 125/71 (!) 131/58 129/71  ?Pulse: (!) 54 72 64 (!) 59  ?Resp: '16 17 15 17  '$ ?Temp: 98.3 ?F (36.8 ?C) 98.7 ?F (37.1 ?C) 99.6 ?F (37.6 ?C) 98.6 ?F (37 ?C)  ?TempSrc: Oral Oral Oral Oral  ?SpO2: 99% 98% 91% 94%  ?Weight:      ?Height:      ? ? ?CBC:  ?Recent Labs  ?Lab 11/22/21 ?3244 11/24/21 ?0114  ?WBC 8.0 6.1  ?NEUTROABS 5.8  --   ?HGB 11.4* 10.0*  ?HCT 38.5* 33.3*  ?MCV 83.7 82.0  ?PLT 203 149*  ? ? ?Basic Metabolic Panel:  ?Recent Labs  ?Lab 11/22/21 ?0102 11/24/21 ?0114  ?NA 139 137  ?K 3.8 4.0  ?CL 102 107  ?CO2 26 25  ?GLUCOSE 204* 137*  ?BUN 23 15  ?CREATININE 1.28* 1.07  ?CALCIUM 8.9 9.0  ?MG  --  2.1  ?PHOS  --  3.4  ? ? ?Lipid Panel:  ?   ?Component Value Date/Time  ? CHOL 117 11/23/2021 0401  ? TRIG 56 11/23/2021 0401  ? HDL 54 11/23/2021 0401  ? CHOLHDL 2.2 11/23/2021 0401  ? VLDL 11 11/23/2021 0401  ? [place] 52 11/23/2021 0401  ? LDLCALC 53 12/13/2020 1232  ? ?HgbA1c:  ?Lab Results  ?Component Value Date  ? HGBA1C 6.7 (H) 09/15/2021  ? ?Urine Drug Screen: No results found for: LABOPIA, COCAINSCRNUR, [place], [place], THCU, LABBARB  ?Alcohol Level No results found for: ETH ? ?IMAGING ? ? ?MR ANGIO HEAD WO CONTRAST ? ?Result Date: 11/22/2021 ?CLINICAL DATA:  Dizziness, persistent/recurrent, cardiac or vascular cause suspected. EXAM: MRI HEAD WITHOUT CONTRAST MRA HEAD WITHOUT CONTRAST TECHNIQUE: Multiplanar, multi-echo pulse sequences of the brain and surrounding structures were acquired without intravenous contrast. Angiographic images of the Circle of Willis were acquired using MRA technique without intravenous contrast. COMPARISON:  Head CT 06/16/2021.  MRI 10/15/2015. FINDINGS: MRI HEAD FINDINGS Brain: Diffusion imaging shows numerous scattered acute infarctions within the  inferior to mid cerebellum on the right. No mass effect or visible hemorrhage. Cerebral hemispheres show old infarctions in both occipital regions, the inferior left frontal lobe, and extensively throughout the cerebral hemispheric white matter. No mass lesion, hemorrhage, hydrocephalus or extra-axial collection. Vascular: Major vessels at the base of the brain show flow. Skull and upper cervical spine: Negative Sinuses/Orbits: Clear/normal Other: None MRA HEAD FINDINGS Anterior circulation: Both internal carotid arteries are patent through the skull base and siphon regions without stenosis. The anterior and middle cerebral vessels are patent. No proximal stenosis, aneurysm or vascular malformation. Posterior circulation: Both vertebral arteries widely patent to the basilar. No visible right PICA flow. No basilar stenosis. Other posterior circulation branch vessels show flow presently. Distal branch vessels not evaluated by this technique. Anatomic variants: None significant. IMPRESSION: Scattered acute infarctions within the inferior to mid cerebellum on the right consistent with infarction in the right PICA territory. No visible PICA flow on the right by MR angiography, though resolution is limited. No evidence of hemorrhage or significant mass effect. Old infarctions in both occipital lobes and in the inferior left frontal lobe. Extensive chronic small-vessel ischemic changes throughout the cerebral hemispheric white matter. Electronically Signed   By: Nelson Chimes M.D.   On: 11/22/2021 12:18  ? ?  MR BRAIN WO CONTRAST ? ?Result Date: 11/22/2021 ?CLINICAL DATA:  Dizziness, persistent/recurrent, cardiac or vascular cause suspected. EXAM: MRI HEAD WITHOUT CONTRAST MRA HEAD WITHOUT CONTRAST TECHNIQUE: Multiplanar, multi-echo pulse sequences of the brain and surrounding structures were acquired without intravenous contrast. Angiographic images of the Circle of Willis were acquired using MRA technique without intravenous  contrast. COMPARISON:  Head CT 06/16/2021.  MRI 10/15/2015. FINDINGS: MRI HEAD FINDINGS Brain: Diffusion imaging shows numerous scattered acute infarctions within the inferior to mid cerebellum on the right. No mass effect or visible hemorrhage. Cerebral hemispheres show old infarctions in both occipital regions, the inferior left frontal lobe, and extensively throughout the cerebral hemispheric white matter. No mass lesion, hemorrhage, hydrocephalus or extra-axial collection. Vascular: Major vessels at the base of the brain show flow. Skull and upper cervical spine: Negative Sinuses/Orbits: Clear/normal Other: None MRA HEAD FINDINGS Anterior circulation: Both internal carotid arteries are patent through the skull base and siphon regions without stenosis. The anterior and middle cerebral vessels are patent. No proximal stenosis, aneurysm or vascular malformation. Posterior circulation: Both vertebral arteries widely patent to the basilar. No visible right PICA flow. No basilar stenosis. Other posterior circulation branch vessels show flow presently. Distal branch vessels not evaluated by this technique. Anatomic variants: None significant. IMPRESSION: Scattered acute infarctions within the inferior to mid cerebellum on the right consistent with infarction in the right PICA territory. No visible PICA flow on the right by MR angiography, though resolution is limited. No evidence of hemorrhage or significant mass effect. Old infarctions in both occipital lobes and in the inferior left frontal lobe. Extensive chronic small-vessel ischemic changes throughout the cerebral hemispheric white matter. Electronically Signed   By: Nelson Chimes M.D.   On: 11/22/2021 12:18  ? ?DG Chest Port 1 View ? ?Result Date: 11/22/2021 ?CLINICAL DATA:  88 year old male with history of weakness and shortness of breath. Irregular heart rate. Possible sepsis. EXAM: PORTABLE CHEST 1 VIEW COMPARISON:  Chest x-ray 06/16/2021. FINDINGS:  Transcutaneous defibrillator pad projecting over the lower left hemithorax. Chronic elevation of the right hemidiaphragm is again noted. Lung volumes are slightly low. Diffuse peribronchial cuffing and patchy areas of interstitial prominence are again noted throughout the lungs bilaterally, similar to prior examinations. No confluent consolidative airspace disease. No pleural effusions. No pneumothorax. No evidence of pulmonary edema. Heart size is borderline enlarged. Upper mediastinal contours are within normal limits. Atherosclerotic calcifications in the thoracic aorta. Surgical clips project over the upper abdomen. IMPRESSION: 1. Similar appearance of the chest with chronic interstitial prominence and peribronchial cuffing which may suggest chronic bronchitis. 2. Chronic elevation of the right hemidiaphragm. 3. Aortic atherosclerosis. Electronically Signed   By: Vinnie Langton M.D.   On: 11/22/2021 08:57  ? ?ECHOCARDIOGRAM COMPLETE ? ?Result Date: 11/23/2021 ?   ECHOCARDIOGRAM REPORT   Patient Name:   Bernard Ramirez Date of Exam: 11/23/2021 Medical Rec #:  [id number]        Height:       72.0 in Accession #:    [id number]       Weight:       231.5 lb Date of Birth:  [birth year]         BSA:          2.267 m? Patient Age:    12 years         BP:           135/67 mmHg Patient Gender: M  HR:           62 bpm. Exam Location:  Inpatient Procedure: 2D Echo and Intracardiac Opacification Agent Indications:    Stroke  History:        Patient has prior history of Echocardiogram examinations, most                 recent 12/17/2017. Stroke, Signs/Symptoms:Chest Pain and                 Shortness of Breath; Risk Factors:Hypertension.  Sonographer:    Arlyss Gandy Referring Phys: [id number] RAVI PAHWANI  Sonographer Comments: Technically challenging study due to limited acoustic windows. Image acquisition challenging due to patient body habitus. IMPRESSIONS  1. Left ventricular ejection fraction, by estimation, is 50  to 55%. The left ventricle has low normal function. The left ventricle demonstrates global hypokinesis. There is mild concentric left ventricular hypertrophy. Left ventricular diastolic function could not be evaluated.

## 2021-11-25 ENCOUNTER — Encounter (HOSPITAL_COMMUNITY): Payer: PPO

## 2021-11-25 LAB — CBC
HCT: 31.2 % — ABNORMAL LOW (ref 39.0–52.0)
Hemoglobin: 10 g/dL — ABNORMAL LOW (ref 13.0–17.0)
MCH: 25.6 pg — ABNORMAL LOW (ref 26.0–34.0)
MCHC: 32.1 g/dL (ref 30.0–36.0)
MCV: 79.8 fL — ABNORMAL LOW (ref 80.0–100.0)
Platelets: 177 10*3/uL (ref 150–400)
RBC: 3.91 MIL/uL — ABNORMAL LOW (ref 4.22–5.81)
RDW: 17 % — ABNORMAL HIGH (ref 11.5–15.5)
WBC: 6.5 10*3/uL (ref 4.0–10.5)
nRBC: 0 % (ref 0.0–0.2)

## 2021-11-25 LAB — MAGNESIUM: Magnesium: 2.1 mg/dL (ref 1.7–2.4)

## 2021-11-25 LAB — BASIC METABOLIC PANEL
Anion gap: 6 (ref 5–15)
BUN: 14 mg/dL (ref 8–23)
CO2: 25 mmol/L (ref 22–32)
Calcium: 9 mg/dL (ref 8.9–10.3)
Chloride: 103 mmol/L (ref 98–111)
Creatinine, Ser: 1.05 mg/dL (ref 0.61–1.24)
GFR, Estimated: >60 mL/min (ref >60)
Glucose, Bld: 120 mg/dL — ABNORMAL HIGH (ref 70–99)
Potassium: 4.4 mmol/L (ref 3.5–5.1)
Sodium: 134 mmol/L — ABNORMAL LOW (ref 135–145)

## 2021-11-25 MED ORDER — SALINE SPRAY 0.65 % NA SOLN
1.0000 | NASAL | Status: DC | PRN
  Filled 2021-11-25: qty 44

## 2021-11-25 NOTE — Plan of Care (Signed)

## 2021-11-25 NOTE — Plan of Care (Signed)
Pt is alert oriented x 3. Pt was restless last night. Pt room temperature was too cold and then too hot. Pt nose became stuffy. Pt turned and repositioned. Temperature to room adjusted multiple times. Pt stated he couldn't breathe with his stuffy nose. Pt placed on 2L nasal cannula for comfort. No distress noted. Pt finally resting with eyes closed.  ? ?Problem: Education: ?Goal: Knowledge of General Education information will improve ?Description: Including pain rating scale, medication(s)/side effects and non-pharmacologic comfort measures ?Outcome: Progressing ?  ?Problem: Health Behavior/Discharge Planning: ?Goal: Ability to manage health-related needs will improve ?Outcome: Progressing ?  ?Problem: Clinical Measurements: ?Goal: Ability to maintain clinical measurements within normal limits will improve ?Outcome: Progressing ?Goal: Will remain free from infection ?Outcome: Progressing ?Goal: Diagnostic test results will improve ?Outcome: Progressing ?Goal: Respiratory complications will improve ?Outcome: Progressing ?Goal: Cardiovascular complication will be avoided ?Outcome: Progressing ?  ?Problem: Activity: ?Goal: Risk for activity intolerance will decrease ?Outcome: Progressing ?  ?Problem: Activity: ?Goal: Risk for activity intolerance will decrease ?Outcome: Progressing ?  ?Problem: Nutrition: ?Goal: Adequate nutrition will be maintained ?Outcome: Progressing ?  ?Problem: Coping: ?Goal: Level of anxiety will decrease ?Outcome: Progressing ?  ?Problem: Elimination: ?Goal: Will not experience complications related to bowel motility ?Outcome: Progressing ?Goal: Will not experience complications related to urinary retention ?Outcome: Progressing ?  ?Problem: Pain Managment: ?Goal: General experience of comfort will improve ?Outcome: Progressing ?  ?Problem: Safety: ?Goal: Ability to remain free from injury will improve ?Outcome: Progressing ?  ?Problem: Skin Integrity: ?Goal: Risk for impaired skin integrity  will decrease ?Outcome: Progressing ?  ?

## 2021-11-25 NOTE — Progress Notes (Signed)
?PROGRESS NOTE ? ? ? ?Bernard Ramirez  DJS:[id number] DOB: 01/18/1931 DOA: 11/22/2021 ?PCP: [name], MD  ? ? ?Brief Narrative:  ?Patient is 88 years old male with past medical history of CVA, prostate cancer and atrial fibrillation not on anticoagulation but on Plavix, BPH, hypertension presented to hospital with weakness lethargy and excessive somnolence for few days prior to presentation.  In the ED, patient was hypothermic and bradycardic.  MRI of the brain and MR angiogram of the head showed multiple infarcts in mainly in the inferior to mid cerebellar region on the right in the PICA territory.  Patient was then admitted hospital for further evaluation and treatment ? ?Assessment and plan ? ?Acute ischemic CVA:  ?Patient with a large cerebellar stroke and extensive old subcortical and cortical infarcts.   Seen by neurology.  Continue Plavix and statins.  MRI of the brain, MRA of the neck shows a cerebellar infarct right PICA territory infarct., 2D echocardiogram with  LV ejection fraction at 50 to 55% with global hypokinesis and mild LVH. PT OT saw the the patient and recommended skilled nursing facility.  Continue telemetry monitoring. hemoglobin A1c at 6.3..  Lipid panel with LDL of 107.  More alert awake today.  Will need 30-day monitoring on discharge. ? ?Metabolic encephalopathy could be secondary to stroke.    Improved at this time.  Blood cultures negative in 2 days.  Lactate was 1.2.  COVID and influenza was negative.  Urinalysis was negative.  No leukocytosis or fever.  ? ?Essential hypertension:  ? high-dose Lasix at home currently on hold. ?  ?AKI:  ?Mild could be secondary to volume depletion.  Improved.  Lasix still on hold.  Could resume by tomorrow. ?  ?GERD: Continue PPI. ?  ?Permanent atrial fibrillation:  ?on Plavix.  Unclear why the patient is not on anticoagulation. ?  ?Nausea/vomiting: No further reported.  Patient was able to tolerate diet.  Eating at bedside. ? ?Hypothermia:  ?On  presentation.  Received [employer].  Improved at this time. ? ?Bradycardia:  Improved at this time.  Avoid nodal blockers. ? ?Goals of care.  Patient has overall guarded prognosis.  palliative care has been consulted.  DNR status.  ? ? DVT prophylaxis:   Lovenox subcu ? ? ?Code Status:   ?  Code Status: DNR ? ?Disposition:  ?Skilled nursing facility as Per PT recommendation. ? ?Status is: Inpatient ? ?The patient is  inpatient because: Large stroke, encephalopathy, need for rehabilitation placement.. ? ? Family Communication:  ?I spoke with the patient's wife on the phone and updated her about the clinical condition of the patient. ? ?Consultants:  ?Neurology ?Palliative care ? ?Procedures:  ?None ? ?Antimicrobials:  ?None ? ? ?Subjective: ?Today, he was seen and examined at bedside.  Patient states that he feels okay.  He was eating food at bedside.  Denies any nausea vomiting shortness of breath and chest pain. ? ?Objective: ?Vitals:  ? 11/24/21 1945 11/24/21 2322 11/25/21 0337 11/25/21 0757  ?BP: 140/68 139/77 136/72 137/73  ?Pulse: 60 66 63 (!) 53  ?Resp: 17 (!) '21 20 20  '$ ?Temp: 98.5 ?F (36.9 ?C) 98.1 ?F (36.7 ?C) 98.8 ?F (37.1 ?C) 99.5 ?F (37.5 ?C)  ?TempSrc: Oral Oral Oral Axillary  ?SpO2: 98% 94%  96%  ?Weight:      ?Height:      ? ? ?Intake/Output Summary (Last 24 hours) at 11/25/2021 1126 ?Last data filed at 11/25/2021 0840 ?Gross per 24 hour  ?Intake --  ?  Output 1500 ml  ?Net -1500 ml  ? ?Filed Weights  ? 11/22/21 0841  ?Weight: 105 kg  ? ? ?Physical Examination: ? ?General:  Average built, not in obvious distress, ?HENT:   No scleral pallor or icterus noted. Oral mucosa is moist.  ?Chest:  Clear breath sounds.  Diminished breath sounds bilaterally. No crackles or wheezes.  ?CVS: S1 &S2 heard. No murmur.  Regular rate and rhythm. ?Abdomen: Soft, nontender, nondistended.  Bowel sounds are heard.   ?Extremities: No cyanosis, clubbing or edema.  Peripheral pulses are palpable. ?Psych: Alert, awake and oriented  to place.  Communicative ?CNS: Mild left facial weakness, right upper extremity mild drift ?Skin: Warm and dry.  No rashes noted. ? ? ?Data Reviewed:  ? ?CBC: ?Recent Labs  ?Lab 11/22/21 ?1025 11/24/21 ?0114 11/25/21 ?8527  ?WBC 8.0 6.1 6.5  ?NEUTROABS 5.8  --   --   ?HGB 11.4* 10.0* 10.0*  ?HCT 38.5* 33.3* 31.2*  ?MCV 83.7 82.0 79.8*  ?PLT 203 149* 177  ? ? ?Basic Metabolic Panel: ?Recent Labs  ?Lab 11/22/21 ?7824 11/24/21 ?2353 11/25/21 ?6144  ?NA 139 137 134*  ?K 3.8 4.0 4.4  ?CL 102 107 103  ?CO2 '26 25 25  '$ ?GLUCOSE 204* 137* 120*  ?BUN '23 15 14  '$ ?CREATININE 1.28* 1.07 1.05  ?CALCIUM 8.9 9.0 9.0  ?MG  --  2.1 2.1  ?PHOS  --  3.4  --   ? ? ?Liver Function Tests: ?Recent Labs  ?Lab 11/22/21 ?0845  ?AST 25  ?ALT 12  ?ALKPHOS 91  ?BILITOT 1.5*  ?PROT 7.2  ?ALBUMIN 4.0  ? ? ? ?Radiology Studies: ?No results found. ? ? ? LOS: 2 days  ? ? ?Flora Lipps, MD ?[facility] ?11/25/2021, 11:26 AM  ? ? ?

## 2021-11-25 NOTE — Progress Notes (Addendum)
STROKE TEAM PROGRESS NOTE  ? ?INTERVAL HISTORY ?Bernard Ramirez is a 88 y.o. male with a history of Afib, silent CVA, HTN, prostate cancer who presents with cerebellar stroke. He was last normal a few days ago and presented for symptoms of generalized weakness and vertigo. CTH was negative for acute abnormality.[facility] MRI-Brain showed scattered acute infarctions within the right inferior to mid-cerebellum and old infarcts in the occipital lobes and inferior frontal lobe.  Echocardiogram showed ejection fraction of 50 to 55% with global hypokinesis.  LDL cholesterol 52 mg percent hemoglobin A1c 6.3. ? ?Patient seen in his room this morning with no family at bedside. He is more alert than yesterday but appears somewhat confused. He follows commands well and his strength and sensation are intact.  ?Palliative care team has been consulted and as per June 2022 decision by the patient he is DNR and no escalation of care hence we will not do loop recorder and prolonged cardiac monitoring ?Vitals:  ? 11/24/21 2322 11/25/21 0337 11/25/21 0757 11/25/21 1241  ?BP: 139/77 136/72 137/73 135/78  ?Pulse: 66 63 (!) 53 (!) 58  ?Resp: (!) '21 20 20 18  '$ ?Temp: 98.1 ?F (36.7 ?C) 98.8 ?F (37.1 ?C) 99.5 ?F (37.5 ?C) (!) 97.5 ?F (36.4 ?C)  ?TempSrc: Oral Oral Axillary Oral  ?SpO2: 94%  96% 97%  ?Weight:      ?Height:      ? ?CBC:  ?Recent Labs  ?Lab 11/22/21 ?0177 11/24/21 ?0114 11/25/21 ?9390  ?WBC 8.0 6.1 6.5  ?NEUTROABS 5.8  --   --   ?HGB 11.4* 10.0* 10.0*  ?HCT 38.5* 33.3* 31.2*  ?MCV 83.7 82.0 79.8*  ?PLT 203 149* 177  ? ?Basic Metabolic Panel:  ?Recent Labs  ?Lab 11/24/21 ?0114 11/25/21 ?0626  ?NA 137 134*  ?K 4.0 4.4  ?CL 107 103  ?CO2 25 25  ?GLUCOSE 137* 120*  ?BUN 15 14  ?CREATININE 1.07 1.05  ?CALCIUM 9.0 9.0  ?MG 2.1 2.1  ?PHOS 3.4  --   ? ?Lipid Panel:  ?Recent Labs  ?Lab 11/23/21 ?0401  ?CHOL 117  ?TRIG 56  ?HDL 54  ?CHOLHDL 2.2  ?VLDL 11  ?[place] 52  ? ?HgbA1c:  ?Recent Labs  ?Lab 11/23/21 ?0401  ?HGBA1C 6.3*  ? ?Urine Drug  Screen: No results for input(s): LABOPIA, COCAINSCRNUR, LABBENZ, AMPHETMU, THCU, LABBARB in the last 168 hours.  ?Alcohol Level No results for input(s): ETH in the last 168 hours. ? ?IMAGING past 24 hours ?No results found. ? ?PHYSICAL EXAM ? ?Physical Exam  ?Constitutional: elderly male, appears sick ?Eyes: No scleral injection ?HENT: No OP obstrucion ?MSK: no joint deformities.  ?Cardiovascular: Normal rate and regular rhythm.  ?Respiratory: Effort normal, non-labored breathing ?GI: Soft.  No distension. There is no tenderness.  ?Skin: WDI ? ?Neuro: ?Mental Status: ?As above ?Cranial Nerves: ?Ocular saccadic dysmetria present on horizontal gaze bilaterally.  No nystagmus.  Extraocular movements are full range ?Motor: ?Tone is normal. Bulk is normal. 5/5 strength was present in all four extremities. ?Sensory: ?Sensation is symmetric to light touch and temperature in the arms and legs. ?Cerebellar: ?HKS intact bilaterally ?Gait deferred ? ? ?ASSESSMENT/PLAN ?Bernard Ramirez is a 88 y.o. male with a history of Afib, silent CVA, HTN, prostate cancer who presents with cerebellar stroke. He was last normal a few days ago and presented for symptoms of generalized weakness and vertigo. CTH was negative for acute anomaly. MRI-Brain showed scattered acute infarctions within the right inferior to mid-cerebellum and old  infarcts in the occipital lobes and inferior frontal lobe.  ? ?Stroke: scattered acute infarctions within the right inferior to mid-cerebellum and old infarcts in the occipital lobes and inferior frontal lobe. Acute infarcts are in the PICA territory, likely 2/2 to Afib.  ?Code Stroke CTH: negative for acute anomaly ?MRI: MRI-Brain showed scattered acute infarctions within the right inferior to mid-cerebellum and old infarcts in the occipital lobes and inferior frontal lobe. ?MRA Head: No visible PICA flow on the right by MR angiography, though resolution is limited ?Carotid Doppler pending ?2D Echo EF  50-55%, moderate LAE, severe RV enlargement, RVSP of 72 ?LDL 52 ?HgbA1c 6.3 ?VTE prophylaxis - Lovenox ?   ?Diet  ? Diet Heart Room service appropriate? Yes; Fluid consistency: Thin  ? ?clopidogrel 75 mg daily prior to admission, now on clopidogrel 75 mg daily. Cannot add ASA given allergy.  ?Therapy recommendations:  PT/OT rec SNF ?Disposition:  SNF ? ?Hypertension ?Home meds:  losartan 100 mg  ?Stable ?Permissive hypertension (OK if < 220/120) but gradually normalize in 5-7 days ?Long-term BP goal normotensive ? ?Hyperlipidemia ?Home meds:  pravastatin 20 mg daily, resumed in hospital ?LDL 52, goal < 70 ?Continue statin at discharge ? ?Glucose control ?Home meds:  NA ?HgbA1c 6.3, goal < 7.0 ?CBGs ?Recent Labs  ?  11/23/21 ?0734 11/23/21 ?1140 11/23/21 ?1644  ?GLUCAP 143* 163* 163*  ?  ?SSI ? ?Other Stroke Risk Factors ?Advanced Age >/= 24  ?Cigarette smoker, advised to stop smoking ?ETOH use, advised to drink no more than 2 drink(s) a day ?Obesity, Body mass index is 31.39 kg/m?., BMI >/= 30 associated with increased stroke risk, recommend weight loss, diet and exercise as appropriate  ?Hx CVA ? 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 ?Hospital day # 2 ? ? ?Corky Sox, MD ?PGY-1 ?I have personally obtained history,examined this patient, reviewed notes, independently viewed imaging studies, participated in medical decision making and plan of care.ROS completed by me personally and pertinent positives fully documented  I have made any additions or clarifications directly to the above note. Agree with note above.  He has been seen by palliative care who plan to meet with spouse and discuss goals of care and ask for previous intubation but patient was DNR escalate care hence did not recommend prolonged cardiac monitoring at this time.  Stroke team will sign off.  Kindly call for questions.  Discussed with Dr. Talmage Nap ? ?Antony Contras, MD ?Medical Director ?[facility] ?Pager: [phone] ?11/25/2021 3:01 PM ? ? ?To contact Stroke Continuity provider, please refer to [email or web address] ?After hours, contact General Neurology ? ?

## 2021-11-25 NOTE — TOC Progression Note (Addendum)
Transition of Care (TOC) - Progression Note  ? ? ?Patient Details  ?Name: Bernard Ramirez ?MRN: [id number] ?Date of Birth: 1930-12-31 ? ?Transition of Care (TOC) CM/SW Contact  ?Geralynn Ochs, LCSW ?Phone Number: ?11/25/2021, 11:01 AM ? ?Clinical Narrative:   CSW attempted to reach patient's wife to discuss SNF choice, left a voicemail asking for a call back. CSW contacted [employer] to initiate SNF and PTAR authorizations. CSW to follow. ? ?UPDATE 3:01 PM: CSW spoke with patient's wife at bedside, she is going to tour [facility] this afternoon. Wife has not liked the other SNFs that she has looked into so far. CSW told wife that we need to know where she'd like to go for SNF as patient is almost stable for DC. Wife to update CSW after touring [facility].  ? ?UPDATE 4:36 PM: CSW received update that patient's wife would like to choose [facility]. CSW received insurance authorization for patient for SNF ([id number]) and PTAR ([id number]). [facility] unsure of bed availability for the weekend, but asked for weekend CSW to check in if patient is stable. CSW to follow. ? ? ? ?Expected Discharge Plan: [place] ?Barriers to Discharge: Continued Medical Work up, [employer] ? ?Expected Discharge Plan and Services ?Expected Discharge Plan: [place] ?  ?  ?Post Acute Care Choice: [place] ?Living arrangements for the past 2 months: [place] ?                ?  ?  ?  ?  ?  ?  ?  ?  ?  ?  ? ? ?Social Determinants of Health (SDOH) Interventions ?  ? ?Readmission Risk Interventions ? ?  02/02/2021  ?  2:10 PM  ?Readmission Risk Prevention Plan  ?Medication Screening Complete  ?Transportation Screening Complete  ? ? ?

## 2021-11-26 ENCOUNTER — Inpatient Hospital Stay (HOSPITAL_COMMUNITY): Payer: PPO

## 2021-11-26 DIAGNOSIS — I639 Cerebral infarction, unspecified: Secondary | ICD-10-CM

## 2021-11-26 NOTE — Progress Notes (Signed)
? ?                                                                                                                                                     ?                                                   ?Daily Progress Note  ? ?Patient Name: Bernard Ramirez       Date: 11/26/2021 ?DOB: [birth year]  Age: 88 y.o. MRN#: [id number] ?Attending Physician: Flora Lipps, MD ?Primary Care Physician: [name], MD ?Admit Date: 11/22/2021 ? ?Reason for Consultation/Follow-up: Establishing goals of care ? ?Patient Profile/HPI:   88 y.o. 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 On presentation he was hypothermic and bradycardic, reporting lethargy, nausea for several days. MRI/MRA revealed large acute cerebellar infarct and chronic occipital and frontal lobe infarctions, chronic small vessel ischemic changes. Palliative medicine consulted for goals of care due to large stroke.  ?  ?Subjective: ?Chart reviewed including all labs, progress notes and imaging.  ?Patient resting- per chart review has been participating with PT, but this has been limited by dizziness.  ?Spouse is at bedside.  ?Spouse shares that he had dizziness before his stroke that he took meclizine for.  ?We discussed patient's current illness and possible trajectories. We discussed his risk of further strokes and complications. The MOST form that is on file for him was reviewed with her and she confirmed current selections. ?She has selected for him to go to [facility].  ?We discussed her plan for if he had another stroke or if he did not recover to a point that he could return home- she noted that she would likely try and find LTC for him- she had her mother in one in  [place] that she liked very much.  ? ?Review of Systems  ?Unable to perform ROS: Mental acuity  ? ? ?Physical Exam ?Vitals and nursing note reviewed.  ?Constitutional:   ?   Comments: sleeping  ?Pulmonary:  ?   Effort: Pulmonary effort is normal.  ?         ? ?Vital Signs: BP 137/70 (BP Location: Left Arm)   Pulse 68   Temp 98.1 ?F (36.7 ?C) (Oral)   Resp (!) 22   Ht 6' (1.829 m)   Wt 105 kg   SpO2 97%   BMI 31.39 kg/m?  ?SpO2:  SpO2: 97 % ?O2 Device: O2 Device: Room Air ?O2 Flow Rate: O2 Flow Rate (L/min): 2 L/min ? ?Intake/output summary:  ?Intake/Output Summary (Last 24 hours) at 11/26/2021 1336 ?Last data filed at 11/26/2021 1000 ?Gross per 24 hour  ?Intake 960 ml  ?Output 3125 ml  ?Net -2165 ml  ? ?LBM: Last BM Date : 11/22/21 (per pts wife) ?Baseline Weight: Weight: 105 kg ?Most recent weight: Weight: 105 kg ? ?     ?Palliative Assessment/Data: PPS: 30% ? ? ? ? ? ?Patient Active Problem List  ? Diagnosis Date Noted  ? Acute ischemic stroke ([place]) 11/22/2021  ? Hypothermia 11/22/2021  ? Sinus bradycardia 11/22/2021  ? GERD without esophagitis 02/18/2021  ? Hypokalemia 02/18/2021  ? Hyperlipidemia LDL goal <100 02/18/2021  ? Hypertensive heart disease with congestive heart failure ([place]) 02/18/2021  ? Snoring 02/08/2021  ? Failure to thrive in adult   ? Cellulitis 02/01/2021  ? Social problem 02/01/2021  ? Protein calorie malnutrition ([place]) 02/01/2021  ? Anxiety 02/01/2021  ? Congestive heart failure (CHF) ([place]) 10/08/2016  ? AKI (acute kidney injury) ([place]) 10/08/2016  ? Acute renal insufficiency due to procedure 04/13/2016  ? BPH (benign prostatic hypertrophy) with urinary obstruction 04/11/2016  ? Shortness of breath 09/27/2015  ? Absolute anemia   ? ED (erectile dysfunction) of organic origin 11/06/2014  ? Benign non-nodular prostatic hyperplasia with lower urinary tract symptoms 11/06/2014  ? Increased frequency of urination 10/12/2014  ? Post-traumatic bulbous urethral stricture 10/12/2014  ? Atrial  fibrillation by electrocardiogram ([place]) 11/08/2012  ? Former smoker 11/08/2012  ? Chronic kidney disease, stage II (mild) 07/11/2012  ? Vertigo   ? Persistent atrial fibrillation ([place])   ? Anemia 06/10/2009  ? Essential hypertension 06/10/2009  ? Back pain 06/10/2009  ? DIARRHEA, CHRONIC 06/10/2009  ? DIVERTICULITIS, HX OF 06/10/2009  ? ? ?Palliative Care Assessment & Plan  ? ? ?Assessment/Recommendations/Plan ? ?Continue current plan of care ?D/C to SNF for rehab ?MOST in [place] ?TOC referral for outpatient Palliative to see at SNF ? ? ?Code Status: ?DNR ? ?Prognosis: ? Unable to determine ? ?Discharge Planning: ?[facility] for rehab with Palliative care service follow-up ? ?Care plan was discussed with spouse.  ? ? ?Total time: 70 minutes ? ? ?   ?Greater than 50%  of this time was spent counseling and coordinating care related to the above assessment and plan. ? ?Mariana Kaufman, AGNP-C ?Palliative Medicine ? ? ?Please contact Palliative Medicine Team phone at [phone] for questions and concerns.  ? ? ? ? ? ? ?

## 2021-11-26 NOTE — Plan of Care (Signed)
Pt. And wife taught signs and symptoms as well as when to call 911 ? ?Problem: Education: ?Goal: Knowledge of secondary prevention will improve (SELECT ALL) ?Outcome: Progressing ?  ?

## 2021-11-26 NOTE — Progress Notes (Signed)
Carotid duplex has been completed. ? ?Results can be found under chart review under CV PROC. ?11/26/2021 10:27 AM ?Kipton Skillen RVT, RDMS ? ?

## 2021-11-26 NOTE — Plan of Care (Signed)
Pt is alert oriented x 3-4. Moves in bed with assistance. No distress noted. Pt has been turned and repositioned. Pt has male purewick in place for urine output. Pt denies pain.  ? ? ?Problem: Education: ?Goal: Knowledge of General Education information will improve ?Description: Including pain rating scale, medication(s)/side effects and non-pharmacologic comfort measures ?Outcome: Progressing ?  ?Problem: Health Behavior/Discharge Planning: ?Goal: Ability to manage health-related needs will improve ?Outcome: Progressing ?  ?Problem: Clinical Measurements: ?Goal: Ability to maintain clinical measurements within normal limits will improve ?Outcome: Progressing ?Goal: Will remain free from infection ?Outcome: Progressing ?Goal: Diagnostic test results will improve ?Outcome: Progressing ?Goal: Respiratory complications will improve ?Outcome: Progressing ?Goal: Cardiovascular complication will be avoided ?Outcome: Progressing ?  ?Problem: Nutrition: ?Goal: Adequate nutrition will be maintained ?Outcome: Progressing ?  ?Problem: Activity: ?Goal: Risk for activity intolerance will decrease ?Outcome: Progressing ?  ?Problem: Coping: ?Goal: Level of anxiety will decrease ?Outcome: Progressing ?  ?Problem: Elimination: ?Goal: Will not experience complications related to bowel motility ?Outcome: Progressing ?Goal: Will not experience complications related to urinary retention ?Outcome: Progressing ?  ?Problem: Pain Managment: ?Goal: General experience of comfort will improve ?Outcome: Progressing ?  ?Problem: Pain Managment: ?Goal: General experience of comfort will improve ?Outcome: Progressing ?  ?Problem: Safety: ?Goal: Ability to remain free from injury will improve ?Outcome: Progressing ?  ?Problem: Skin Integrity: ?Goal: Risk for impaired skin integrity will decrease ?Outcome: Progressing ?  ?

## 2021-11-26 NOTE — Progress Notes (Signed)
?PROGRESS NOTE ? ? ? ?SANCHEZ HEMMER  WVP:[id number] DOB: 1930-09-11 DOA: 11/22/2021 ?PCP: [name], MD  ? ? ?Brief Narrative:  ?Patient is 88 years old male with past medical history of CVA, prostate cancer and atrial fibrillation not on anticoagulation but on Plavix, BPH, hypertension presented to hospital with weakness lethargy and excessive somnolence for few days prior to presentation.  In the ED, patient was hypothermic and bradycardic.  MRI of the brain and MR angiogram of the head showed multiple infarcts in mainly in the inferior to mid cerebellar region on the right in the PICA territory.  Patient was then admitted hospital for further evaluation and treatment ? ?Assessment and plan ?Principal Problem: ?  Acute ischemic stroke ([place]) ?Active Problems: ?  Permanent atrial fibrillation ([place]) ?  AKI (acute kidney injury) ([place]) ?  Hyperlipidemia LDL goal <100 ?  Hypothermia ?  Sinus bradycardia ?  ?Acute ischemic CVA:  ?Patient with a large cerebellar stroke and extensive old subcortical and cortical infarcts.   Seen by neurology.  Continue Plavix and statins.  MRI of the brain, MRA of the neck shows a cerebellar infarct with right PICA territory infarct., 2D echocardiogram with  LV ejection fraction at 50 to 55% with global hypokinesis and mild LVH. PT,OT saw the the patient and recommended skilled nursing facility.  Continue telemetry monitoring. Hemoglobin A1c at 6.3.  Lipid panel with LDL of 107.    Neurology has seen the patient and did not recommend loop recorder at this time. ? ?Metabolic encephalopathy could be secondary to stroke.    Improved at this time.  Blood cultures negative in 2 days.  Lactate was 1.2.  COVID and influenza was negative.  Urinalysis was negative.  No leukocytosis or fever.  ? ?Essential hypertension:  ? high-dose Lasix at home currently on hold. ?  ?AKI:  ?Mild could be secondary to volume depletion.  Improved.  Lasix still on hold.  Patient is negative balance for 53 9  mL. ?  ?GERD: Continue PPI. ?  ?Persistent atrial fibrillation:  ?on Plavix.  Unclear why the patient is not on anticoagulation. I spoke with about it with the patient's wife. ?  ?Nausea/vomiting: No further reported.  Patient was able to tolerate diet.  Eating at bedside. ? ?Hypothermia:  ?On presentation.  Received [employer].  Improved at this time. ? ?Bradycardia:  Improved at this time.  Avoid nodal blockers. ? ?Goals of care.  Patient has overall guarded prognosis.  Palliative care has been consulted.  DNR status.  ? ? DVT prophylaxis:   Lovenox subcu ? ? ?Code Status:   ?  Code Status: DNR ? ?Disposition:  ?Skilled nursing facility as Per PT recommendation. ? ?Status is: Inpatient ? ?The patient is  inpatient because: Large stroke, encephalopathy, need for rehabilitation placement.. ? ? Family Communication:  ?I spoke with the patient's wife on the phone and updated her about the clinical condition of the patient. ? ?Consultants:  ?Neurology ?Palliative care ? ?Procedures:  ?None ? ?Antimicrobials:  ?None ? ?Subjective: ?Today, patient was seen and examined at bedside.  Was eating breakfast this morning.  Denies any pain, nausea, vomiting shortness of breath ? ?Objective: ?Vitals:  ? 11/25/21 1550 11/25/21 1959 11/25/21 2306 11/26/21 0518  ?BP: (!) 123/55 (!) 155/74 133/78 132/68  ?Pulse: 62 (!) 53 (!) 56 61  ?Resp: 19 17 (!) 21 (!) 22  ?Temp: 98.1 ?F (36.7 ?C) 98.1 ?F (36.7 ?C) 98.2 ?F (36.8 ?C) 98.9 ?F (37.2 ?  C)  ?TempSrc: Oral Oral Oral Oral  ?SpO2: 100% 96% 98% 97%  ?Weight:      ?Height:      ? ? ?Intake/Output Summary (Last 24 hours) at 11/26/2021 1026 ?Last data filed at 11/26/2021 0807 ?Gross per 24 hour  ?Intake 600 ml  ?Output 2950 ml  ?Net -2350 ml  ? ?Filed Weights  ? 11/22/21 0841  ?Weight: 105 kg  ? ? ?Physical Examination: ? ?General:  Average built, not in obvious distress ?HENT:   No scleral pallor or icterus noted. Oral mucosa is moist.  ?Chest:  Clear breath sounds.  Diminished breath sounds  bilaterally.  ?CVS: S1 &S2 heard. No murmur.  Regular rate and rhythm. ?Abdomen: Soft, nontender, nondistended.  Bowel sounds are heard.   ?Extremities: No cyanosis, clubbing or edema.  Peripheral pulses are palpable. ?Psych: Alert, awake and oriented to place, normal mood ?CNS:  Mild left facial weakness, right upper extremity mild drift ?Skin: Warm and dry.  No rashes noted. ? ? ?Data Reviewed:  ? ?CBC: ?Recent Labs  ?Lab 11/22/21 ?0347 11/24/21 ?0114 11/25/21 ?4259  ?WBC 8.0 6.1 6.5  ?NEUTROABS 5.8  --   --   ?HGB 11.4* 10.0* 10.0*  ?HCT 38.5* 33.3* 31.2*  ?MCV 83.7 82.0 79.8*  ?PLT 203 149* 177  ? ? ?Basic Metabolic Panel: ?Recent Labs  ?Lab 11/22/21 ?5638 11/24/21 ?7564 11/25/21 ?3329  ?NA 139 137 134*  ?K 3.8 4.0 4.4  ?CL 102 107 103  ?CO2 '26 25 25  '$ ?GLUCOSE 204* 137* 120*  ?BUN '23 15 14  '$ ?CREATININE 1.28* 1.07 1.05  ?CALCIUM 8.9 9.0 9.0  ?MG  --  2.1 2.1  ?PHOS  --  3.4  --   ? ? ?Liver Function Tests: ?Recent Labs  ?Lab 11/22/21 ?0845  ?AST 25  ?ALT 12  ?ALKPHOS 91  ?BILITOT 1.5*  ?PROT 7.2  ?ALBUMIN 4.0  ? ? ? ?Radiology Studies: ?VAS US CAROTID ? ?Result Date: 11/26/2021 ?Carotid Arterial Duplex Study Patient Name:  NETANEL YANNUZZI  Date of Exam:   11/26/2021 Medical Rec #: [id number]         Accession #:    [id number] Date of Birth: 1931-02-22          Patient Gender: M Patient Age:   88 years Exam Location:  [facility] Procedure:      VAS US CAROTID Referring Phys: Corrie Mckusick Vena Bassinger --------------------------------------------------------------------------------  Indications:       CVA. Risk Factors:      Hypertension, hyperlipidemia, past history of smoking, prior                    CVA. Other Factors:     Afib, CHF. Limitations        Today's exam was limited due to bandage to right neck, and                    patient inability to position properly. Comparison Study:  No previous exams Performing Technologist: Jody Hill RVT, RDMS  Examination Guidelines: A complete evaluation includes B-mode imaging,  spectral Doppler, color Doppler, and power Doppler as needed of all accessible portions of each vessel. Bilateral testing is considered an integral part of a complete examination. Limited examinations for reoccurring indications may be performed as noted.  Right Carotid Findings: +----------+--------+--------+--------+----------------------+--------+           PSV cm/sEDV cm/sStenosisPlaque Description    Comments +----------+--------+--------+--------+----------------------+--------+ CCA Prox  77  9                                              +----------+--------+--------+--------+----------------------+--------+ CCA Distal74      12                                             +----------+--------+--------+--------+----------------------+--------+ ICA Prox  58      8               focal and heterogenous         +----------+--------+--------+--------+----------------------+--------+ ICA Distal57      11                                             +----------+--------+--------+--------+----------------------+--------+ ECA       93      0                                              +----------+--------+--------+--------+----------------------+--------+ +----------+--------+-------+----------------+-------------------+           PSV cm/sEDV cmsDescribe        Arm Pressure (mmHG) +----------+--------+-------+----------------+-------------------+ [id number]             Multiphasic, WNL                    +----------+--------+-------+----------------+-------------------+ +---------+--------+--+--------+-+---------+ VertebralPSV cm/s49EDV cm/s9Antegrade +---------+--------+--+--------+-+---------+  Left Carotid Findings: +----------+--------+--------+--------+------------------+--------+           PSV cm/sEDV cm/sStenosisPlaque DescriptionComments +----------+--------+--------+--------+------------------+--------+ CCA Prox  112     15                                          +----------+--------+--------+--------+------------------+--------+ CCA Distal79      15                                         +----------+--------+--------+--------+------------------+--------+ ICA Prox  67      13

## 2021-11-27 LAB — CULTURE, BLOOD (ROUTINE X 2)
Culture: NO GROWTH
Culture: NO GROWTH
Special Requests: ADEQUATE
Special Requests: ADEQUATE

## 2021-11-27 MED ORDER — POLYETHYLENE GLYCOL 3350 17 G PO PACK
17.0000 g | PACK | Freq: Every day | ORAL | Status: DC
Start: 2021-11-27 — End: 2021-11-28
  Administered 2021-11-27: 17 g via ORAL
  Filled 2021-11-27: qty 1

## 2021-11-27 MED ORDER — SENNOSIDES-DOCUSATE SODIUM 8.6-50 MG PO TABS
1.0000 | ORAL_TABLET | Freq: Every day | ORAL | Status: DC
  Administered 2021-11-27: 1 via ORAL
  Filled 2021-11-27: qty 1

## 2021-11-27 MED ORDER — DOCUSATE SODIUM 100 MG PO CAPS
100.0000 mg | ORAL_CAPSULE | Freq: Two times a day (BID) | ORAL | Status: DC
  Administered 2021-11-27 (×2): 100 mg via ORAL
  Filled 2021-11-27 (×2): qty 1

## 2021-11-27 NOTE — Progress Notes (Signed)
Per Abigail Butts at [facility], the do not have bed availability until Monday. MD updated. Will plan for dc Monday.  ? ?Wandra Feinstein, MSW, LCSW ?[phone] (coverage) ? ? ?

## 2021-11-27 NOTE — Plan of Care (Signed)
Was able to verbalize what to do and some s/s of stroke yesterday, today less so.  ? ?Problem: Education: ?Goal: Knowledge of secondary prevention will improve (SELECT ALL) ?11/27/2021 1535 by Larry Sierras, RN ?Outcome: Not Progressing ?11/27/2021 1534 by Larry Sierras, RN ?Outcome: Progressing ?  ?

## 2021-11-27 NOTE — Progress Notes (Addendum)
?PROGRESS NOTE ? ? ? ?Bernard Ramirez  OVZ:[id number] DOB: 28-Jun-1931 DOA: 11/22/2021 ?PCP: [name], MD  ? ? ?Brief Narrative:  ?Patient is 88 years old male with past medical history of CVA, prostate cancer and atrial fibrillation not on anticoagulation but on Plavix, BPH, hypertension presented to hospital with weakness lethargy and excessive somnolence for few days prior to presentation.  In the ED, patient was hypothermic and bradycardic.  MRI of the brain and MR angiogram of the head showed multiple infarcts in mainly in the inferior to mid cerebellar region on the right in the PICA territory.  Patient was then admitted hospital for further evaluation and treatment ? ?Assessment and plan ?Principal Problem: ?  Acute ischemic stroke ([place]) ?Active Problems: ?  Permanent atrial fibrillation ([place]) ?  AKI (acute kidney injury) ([place]) ?  Hyperlipidemia LDL goal <100 ?  Hypothermia ?  Sinus bradycardia ?  ?Acute ischemic CVA:  ?Patient with a large cerebellar stroke and extensive old subcortical and cortical infarcts.   Seen by neurology.  Continue Plavix and statins.  MRI of the brain, MRA of the neck shows a cerebellar infarct with right PICA territory infarct., 2D echocardiogram with  LV ejection fraction at 50 to 55% with global hypokinesis and mild LVH. PT,OT saw the the patient and recommended skilled nursing facility.  Continue telemetry monitoring. Hemoglobin A1c at 6.3.  Lipid panel with LDL of 107.    Neurology has seen the patient and did not recommend loop recorder at this time. ? ?Metabolic encephalopathy could be secondary to stroke.    Improved at this time.  Blood cultures negative in 2 days.  Lactate was 1.2.  COVID and influenza was negative.  Urinalysis was negative.  No leukocytosis or fever.  ? ?Essential hypertension:  ?On high-dose Lasix at home, currently on hold. ?  ?AKI:  ?Mild could be secondary to volume depletion.  Improved.  Lasix still on hold.  Patient is negative balance for 2149  ml ?  ?GERD: Continue PPI. ?  ?Persistent atrial fibrillation:  ?on Plavix.  Not on anticoagulation. ? ?Nausea/vomiting: No further reported.  Patient was able to tolerate diet.  Eating at bedside. ? ?Hypothermia:  ?On presentation.  Received Retail banker.  Improved at this time. ? ?Bradycardia:  Improved at this time.  Avoid nodal blockers. ? ?Goals of care.  Patient has overall guarded prognosis.  Palliative care has been consulted.  DNR status.  ? ? DVT prophylaxis:   Lovenox subcu ? ? ?Code Status:   ?  Code Status: DNR ? ?Disposition:  ?Skilled nursing facility as Per PT recommendation. ? ?Status is: Inpatient ? ?The patient is  inpatient because: Large stroke, metabolic encephalopathy, need for rehabilitation placement.. ? ? Family Communication:  ?I spoke with the patient's wife on the phone on 11/26/2021 and on 11/27/21 ? ?Consultants:  ?Neurology ?Palliative care ? ?Procedures:  ?None ? ?Antimicrobials:  ?None ? ?Subjective: ? ?Today, patient was seen and examined at bedside.  Denies any shortness of breath chest pain dizziness/lightheadedness.  Was eating breakfast. ? ?Objective: ?Vitals:  ? 11/26/21 2020 11/26/21 2308 11/27/21 0316 11/27/21 0745  ?BP: (!) 142/67 135/64 138/66 136/67  ?Pulse: (!) 58 60  69  ?Resp:    20  ?Temp: 98.4 ?F (36.9 ?C) 97.6 ?F (36.4 ?C) 99.1 ?F (37.3 ?C) 99.8 ?F (37.7 ?C)  ?TempSrc: Oral Oral Oral Oral  ?SpO2: 100% 96% 93% 95%  ?Weight:      ?Height:      ? ? ?  Intake/Output Summary (Last 24 hours) at 11/27/2021 1058 ?Last data filed at 11/27/2021 1000 ?Gross per 24 hour  ?Intake 480 ml  ?Output 2275 ml  ?Net -1795 ml  ? ?Filed Weights  ? 11/22/21 0841  ?Weight: 105 kg  ? ? ?Physical Examination: ? ?General:  Average built, not in obvious distress ?HENT:   No scleral pallor or icterus noted. Oral mucosa is moist.  ?Chest:  Clear breath sounds.  Diminished breath sounds bilaterally. ?CVS: S1 &S2 heard. No murmur.  Regular rate and rhythm. ?Abdomen: Soft, nontender, nondistended.  Bowel  sounds are heard.   ?Extremities: No cyanosis, clubbing or edema.  Peripheral pulses are palpable. ?Psych: Alert, awake and oriented, normal mood ?CNS: Mild left facial weakness, right upper extremity mild drift. ?Skin: Warm and dry.  No rashes noted. ? ?Data Reviewed:  ? ?CBC: ?Recent Labs  ?Lab 11/22/21 ?0347 11/24/21 ?0114 11/25/21 ?4259  ?WBC 8.0 6.1 6.5  ?NEUTROABS 5.8  --   --   ?HGB 11.4* 10.0* 10.0*  ?HCT 38.5* 33.3* 31.2*  ?MCV 83.7 82.0 79.8*  ?PLT 203 149* 177  ? ? ?Basic Metabolic Panel: ?Recent Labs  ?Lab 11/22/21 ?5638 11/24/21 ?7564 11/25/21 ?3329  ?NA 139 137 134*  ?K 3.8 4.0 4.4  ?CL 102 107 103  ?CO2 '26 25 25  '$ ?GLUCOSE 204* 137* 120*  ?BUN '23 15 14  '$ ?CREATININE 1.28* 1.07 1.05  ?CALCIUM 8.9 9.0 9.0  ?MG  --  2.1 2.1  ?PHOS  --  3.4  --   ? ? ?Liver Function Tests: ?Recent Labs  ?Lab 11/22/21 ?0845  ?AST 25  ?ALT 12  ?ALKPHOS 91  ?BILITOT 1.5*  ?PROT 7.2  ?ALBUMIN 4.0  ? ? ? ?Radiology Studies: ?VAS US CAROTID ? ?Result Date: 11/26/2021 ?Carotid Arterial Duplex Study Patient Name:  FABRIZZIO MARCELLA  Date of Exam:   11/26/2021 Medical Rec #: [id number]         Accession #:    [id number] Date of Birth: 07/15/1931          Patient Gender: M Patient Age:   36 years Exam Location:  [facility] Procedure:      VAS US CAROTID Referring Phys: Corrie Mckusick Jessica Checketts --------------------------------------------------------------------------------  Indications:       CVA. Risk Factors:      Hypertension, hyperlipidemia, past history of smoking, prior                    CVA. Other Factors:     Afib, CHF. Limitations        Today's exam was limited due to bandage to right neck, and                    patient inability to position properly. Comparison Study:  No previous exams Performing Technologist: Jody Hill RVT, RDMS  Examination Guidelines: A complete evaluation includes B-mode imaging, spectral Doppler, color Doppler, and power Doppler as needed of all accessible portions of each vessel. Bilateral testing is  considered an integral part of a complete examination. Limited examinations for reoccurring indications may be performed as noted.  Right Carotid Findings: +----------+--------+--------+--------+----------------------+--------+           PSV cm/sEDV cm/sStenosisPlaque Description    Comments +----------+--------+--------+--------+----------------------+--------+ CCA Prox  77      9                                              +----------+--------+--------+--------+----------------------+--------+  CCA Distal74      12                                             +----------+--------+--------+--------+----------------------+--------+ ICA Prox  58      8               focal and heterogenous         +----------+--------+--------+--------+----------------------+--------+ ICA Distal57      11                                             +----------+--------+--------+--------+----------------------+--------+ ECA       93      0                                              +----------+--------+--------+--------+----------------------+--------+ +----------+--------+-------+----------------+-------------------+           PSV cm/sEDV cmsDescribe        Arm Pressure (mmHG) +----------+--------+-------+----------------+-------------------+ [id number]             Multiphasic, WNL                    +----------+--------+-------+----------------+-------------------+ +---------+--------+--+--------+-+---------+ VertebralPSV cm/s49EDV cm/s9Antegrade +---------+--------+--+--------+-+---------+  Left Carotid Findings: +----------+--------+--------+--------+------------------+--------+           PSV cm/sEDV cm/sStenosisPlaque DescriptionComments +----------+--------+--------+--------+------------------+--------+ CCA Prox  112     15                                         +----------+--------+--------+--------+------------------+--------+ CCA Distal79      15                                          +----------+--------+--------+--------+------------------+--------+ ICA Prox  67      13                                         +----------+--------+--------+--------+------------------+--------+ ICA Distal77      17

## 2021-11-28 DIAGNOSIS — K5900 Constipation, unspecified: Secondary | ICD-10-CM | POA: Diagnosis not present

## 2021-11-28 DIAGNOSIS — G9341 Metabolic encephalopathy: Secondary | ICD-10-CM | POA: Diagnosis not present

## 2021-11-28 DIAGNOSIS — I10 Essential (primary) hypertension: Secondary | ICD-10-CM | POA: Diagnosis not present

## 2021-11-28 DIAGNOSIS — G459 Transient cerebral ischemic attack, unspecified: Secondary | ICD-10-CM | POA: Diagnosis not present

## 2021-11-28 DIAGNOSIS — Z8673 Personal history of transient ischemic attack (TIA), and cerebral infarction without residual deficits: Secondary | ICD-10-CM | POA: Diagnosis not present

## 2021-11-28 DIAGNOSIS — K219 Gastro-esophageal reflux disease without esophagitis: Secondary | ICD-10-CM | POA: Diagnosis not present

## 2021-11-28 DIAGNOSIS — I4891 Unspecified atrial fibrillation: Secondary | ICD-10-CM | POA: Diagnosis not present

## 2021-11-28 DIAGNOSIS — E785 Hyperlipidemia, unspecified: Secondary | ICD-10-CM | POA: Diagnosis not present

## 2021-11-28 DIAGNOSIS — R42 Dizziness and giddiness: Secondary | ICD-10-CM | POA: Diagnosis not present

## 2021-11-28 DIAGNOSIS — I639 Cerebral infarction, unspecified: Secondary | ICD-10-CM | POA: Diagnosis not present

## 2021-11-28 DIAGNOSIS — R609 Edema, unspecified: Secondary | ICD-10-CM | POA: Diagnosis not present

## 2021-11-28 DIAGNOSIS — I480 Paroxysmal atrial fibrillation: Secondary | ICD-10-CM | POA: Diagnosis not present

## 2021-11-28 DIAGNOSIS — R680 Hypothermia, not associated with low environmental temperature: Secondary | ICD-10-CM | POA: Diagnosis not present

## 2021-11-28 DIAGNOSIS — N179 Acute kidney failure, unspecified: Secondary | ICD-10-CM | POA: Diagnosis not present

## 2021-11-28 DIAGNOSIS — G9340 Encephalopathy, unspecified: Secondary | ICD-10-CM | POA: Diagnosis not present

## 2021-11-28 DIAGNOSIS — N400 Enlarged prostate without lower urinary tract symptoms: Secondary | ICD-10-CM | POA: Diagnosis not present

## 2021-11-28 DIAGNOSIS — R059 Cough, unspecified: Secondary | ICD-10-CM | POA: Diagnosis not present

## 2021-11-28 DIAGNOSIS — C61 Malignant neoplasm of prostate: Secondary | ICD-10-CM | POA: Diagnosis not present

## 2021-11-28 DIAGNOSIS — Z8546 Personal history of malignant neoplasm of prostate: Secondary | ICD-10-CM | POA: Diagnosis not present

## 2021-11-28 DIAGNOSIS — T68XXXA Hypothermia, initial encounter: Secondary | ICD-10-CM | POA: Diagnosis not present

## 2021-11-28 DIAGNOSIS — R531 Weakness: Secondary | ICD-10-CM | POA: Diagnosis not present

## 2021-11-28 DIAGNOSIS — I4819 Other persistent atrial fibrillation: Secondary | ICD-10-CM | POA: Diagnosis not present

## 2021-11-28 DIAGNOSIS — N401 Enlarged prostate with lower urinary tract symptoms: Secondary | ICD-10-CM | POA: Diagnosis not present

## 2021-11-28 DIAGNOSIS — Z7401 Bed confinement status: Secondary | ICD-10-CM | POA: Diagnosis not present

## 2021-11-28 DIAGNOSIS — I4821 Permanent atrial fibrillation: Secondary | ICD-10-CM | POA: Diagnosis not present

## 2021-11-28 DIAGNOSIS — R001 Bradycardia, unspecified: Secondary | ICD-10-CM | POA: Diagnosis not present

## 2021-11-28 MED ORDER — POLYETHYLENE GLYCOL 3350 17 G PO PACK: 17.0000 g | PACK | Freq: Every day | ORAL | 0 refills | Status: DC | PRN

## 2021-11-28 MED ORDER — SENNOSIDES-DOCUSATE SODIUM 8.6-50 MG PO TABS: 1.0000 | ORAL_TABLET | Freq: Every day | ORAL | Status: DC

## 2021-11-28 NOTE — Progress Notes (Signed)
Attempted calling report, RN not available to take report.  This RN left number to call back. ?

## 2021-11-28 NOTE — Discharge Summary (Signed)
?Physician Discharge Summary ?  ?Patient: Bernard Ramirez MRN: [id number] DOB: 1931/04/14  ?Admit date:     11/22/2021  ?Discharge date: 11/28/21  ?Discharge Physician: Corrie Mckusick Sire Poet  ? ?PCP: [name], MD  ? ?Recommendations at discharge:  ? ?Follow-up with your primary care provider at the skilled nursing facility in 3 to 5 days. ?Follow-up with neurology as outpatient.  Ambulatory referral has been made with [facility] neurology. ? ?Discharge Diagnoses: ?Principal Problem: ?  Acute ischemic stroke ([place]) ?Active Problems: ?  Persistent atrial fibrillation ([place]) ?  AKI (acute kidney injury) ([place]) ?  Hyperlipidemia LDL goal <100 ?  Hypothermia ?  Sinus bradycardia ? ?Resolved Problems: ?  * No resolved hospital problems. * ? ?Hospital Course: ?Patient is 88 years old male with past medical history of CVA, prostate cancer and atrial fibrillation not on anticoagulation but on Plavix, BPH, hypertension presented to hospital with weakness lethargy and excessive somnolence for few days prior to presentation.  In the ED, patient was hypothermic and bradycardic.  MRI of the brain and MR angiogram of the head showed multiple infarcts in mainly in the inferior to mid cerebellar region on the right in the PICA territory.  Patient was then admitted hospital for further evaluation and treatment ? ?Assessment and plan ?Principal Problem: ?  Acute ischemic stroke ([place]) ?Active Problems: ?  Permanent atrial fibrillation ([place]) ?  AKI (acute kidney injury) ([place]) ?  Hyperlipidemia LDL goal <100 ?  Hypothermia ?  Sinus bradycardia ?  ?Acute ischemic CVA:  ?Patient with a large cerebellar stroke and extensive old subcortical and cortical infarcts.   Seen by neurology.  Continue Plavix and statins.  MRI of the brain, MRA of the neck shows a cerebellar infarct with right PICA territory infarct., 2D echocardiogram with  LV ejection fraction at 50 to 55% with global hypokinesis and mild LVH. PT,OT saw the the patient and recommended  skilled nursing facility.   Hemoglobin A1c at 6.3.  Lipid panel with LDL of 107.    Neurology has seen the patient and did not recommend loop recorder at this time.  Recommendation is to continue Plavix and statins at this time.  Patient is allergic to aspirin ? ?Metabolic encephalopathy.  Resolved.  Thought to be secondary to stroke. Blood cultures negative in 2 days.  Lactate was 1.2.  COVID and influenza was negative.  Urinalysis was negative.  No leukocytosis or fever.  ? ?Essential hypertension:  ?Resume Lasix and losartan from home ?  ?AKI:  ?Resolved.  Mild could be secondary to volume depletion.  Latest creatinine of 1.07.  Will be resumed on losartan from home. ? ?GERD: Continue protonix at discharge ?  ?Persistent atrial fibrillation:  ?on Plavix.  Not on anticoagulation. ? ?Hypothermia:  ?On presentation.  Received Retail banker.  Improved at this time.  No signs of sepsis. ? ?Bradycardia:  Improved at this time.  Avoid nodal blockers. ? ?Goals of care.  Patient had overall guarded prognosis.  Palliative care was consulted.  DNR status. ? ?Consultants: Neurology ? ?Procedures performed: None ? ?Disposition: Skilled nursing facility ? ?Diet recommendation:  ?Discharge Diet Orders (From admission, onward)  ? ?  Start     Ordered  ? 11/28/21 0000  Diet - low sodium heart healthy       ? 11/28/21 0930  ? ?  ?  ? ?  ? ?Cardiac diet ?DISCHARGE MEDICATION: ?Allergies as of 11/28/2021   ? ?   Reactions  ? Contrast Media [  iodinated Contrast Media]   ? Pt states he was given "dye" 20 yrs ago, anaphylaxis & syncope - was told to never have it again   ? Gabapentin Itching  ? Aspirin Other (See Comments)  ? Blistering  ? Latex Rash  ? Lipitor [atorvastatin] Itching  ? Tape Rash  ? EKG leads  ? ?  ? ?  ?Medication List  ?  ? ?STOP taking these medications   ? ?HYDROcodone-acetaminophen 10-325 MG tablet ?Commonly known as: NORCO ?  ?silver sulfADIAZINE 1 % cream ?Commonly known as: Silvadene ?  ? ?  ? ?TAKE these  medications   ? ?clobetasol cream 0.05 % ?Commonly known as: TEMOVATE ?Apply 1 application topically daily as needed (use as directed for skin). ?  ?clobetasol 0.05 % external solution ?Commonly known as: TEMOVATE ?Apply 1 application topically 2 (two) times daily. ?  ?clopidogrel 75 MG tablet ?Commonly known as: PLAVIX ?TAKE 1 TABLET(75 MG) BY MOUTH DAILY ?  ?furosemide 40 MG tablet ?Commonly known as: LASIX ?TAKE 1 AND 1/2 TABLETS BY MOUTH EVERY MORNING THEN TAKE 1 TABLET BY MOUTH EVERY AFTERNOON ?What changed:  ?how much to take ?how to take this ?when to take this ?additional instructions ?  ?losartan 100 MG tablet ?Commonly known as: COZAAR ?TAKE 1 TABLET(100 MG) BY MOUTH DAILY ?  ?meclizine 25 MG tablet ?Commonly known as: ANTIVERT ?Take one tablet by mouth at bedtime. ?  ?NON FORMULARY ?Diet:Regular, NAS ?  ?nystatin powder ?Generic drug: nystatin ?APPLY TOPICALLY TO THE AFFECTED AREA EVERY DAY AS NEEDED FOR RASH ?  ?pantoprazole 40 MG tablet ?Commonly known as: PROTONIX ?TAKE 1 TABLET(40 MG) BY MOUTH DAILY ?  ?polyethylene glycol 17 g packet ?Commonly known as: MIRALAX / GLYCOLAX ?Take 17 g by mouth daily as needed for moderate constipation or severe constipation. ?  ?potassium chloride SA 20 MEQ tablet ?Commonly known as: KLOR-CON M ?TAKE 1 TABLET(20 MEQ) BY MOUTH DAILY ?  ?pravastatin 20 MG tablet ?Commonly known as: PRAVACHOL ?TAKE 1 TABLET(20 MG) BY MOUTH DAILY ?  ?senna-docusate 8.6-50 MG tablet ?Commonly known as: Senokot-S ?Take 1 tablet by mouth at bedtime. ?  ?triamcinolone cream 0.1 % ?Commonly known as: KENALOG ?Apply 1 application topically daily as needed (for irritation). ?  ? ?  ? ?Subjective: ?Patient was seen and examined at bedside.  Denies interval complaints.  Denies any nausea vomiting fever chest pain shortness of breath or cough. ? ?Discharge Exam: ?Filed Weights  ? 11/22/21 0841  ?Weight: 105 kg  ? ? ?  11/28/2021  ?  7:51 AM 11/28/2021  ?  3:07 AM 11/27/2021  ? 11:08 PM  ?Vitals with BMI   ?Systolic 174 081 448  ?Diastolic 76 65 68  ?Pulse 84 72 68  ?  ?General:  Average built, not in obvious distress ?HENT:   No scleral pallor or icterus noted. Oral mucosa is moist.  ?Chest:    Diminished breath sounds bilaterally. No crackles or wheezes.  ?CVS: S1 &S2 heard. No murmur.  Regular rate and rhythm. ?Abdomen: Soft, nontender, nondistended.  Bowel sounds are heard.   ?Extremities: No cyanosis, clubbing or edema.  Peripheral pulses are palpable. ?Psych: Alert, awake and oriented, normal mood ?CNS: Mild left facial weakness.  Moves all extremities. ?Skin: Warm and dry.  No rashes noted. ? ? ?Condition at discharge: good ? ?The results of significant diagnostics from this hospitalization (including imaging, microbiology, ancillary and laboratory) are listed below for reference.  ? ?Imaging Studies: ?MR ANGIO HEAD WO  CONTRAST ? ?Result Date: 11/22/2021 ?CLINICAL DATA:  Dizziness, persistent/recurrent, cardiac or vascular cause suspected. EXAM: MRI HEAD WITHOUT CONTRAST MRA HEAD WITHOUT CONTRAST TECHNIQUE: Multiplanar, multi-echo pulse sequences of the brain and surrounding structures were acquired without intravenous contrast. Angiographic images of the Circle of Willis were acquired using MRA technique without intravenous contrast. COMPARISON:  Head CT 06/16/2021.  MRI 10/15/2015. FINDINGS: MRI HEAD FINDINGS Brain: Diffusion imaging shows numerous scattered acute infarctions within the inferior to mid cerebellum on the right. No mass effect or visible hemorrhage. Cerebral hemispheres show old infarctions in both occipital regions, the inferior left frontal lobe, and extensively throughout the cerebral hemispheric white matter. No mass lesion, hemorrhage, hydrocephalus or extra-axial collection. Vascular: Major vessels at the base of the brain show flow. Skull and upper cervical spine: Negative Sinuses/Orbits: Clear/normal Other: None MRA HEAD FINDINGS Anterior circulation: Both internal carotid arteries are  patent through the skull base and siphon regions without stenosis. The anterior and middle cerebral vessels are patent. No proximal stenosis, aneurysm or vascular malformation. Posterior circulation: Both verteb

## 2021-11-28 NOTE — TOC Transition Note (Signed)
Transition of Care (TOC) - CM/SW Discharge Note ? ? ?Patient Details  ?Name: Bernard Ramirez ?MRN: [id number] ?Date of Birth: 1931/07/31 ? ?Transition of Care [facility] CM/SW Contact:  ?Geralynn Ochs, LCSW ?Phone Number: ?11/28/2021, 12:15 PM ? ? ?Clinical Narrative:   CSW confirmed with [facility] that bed is available for patient. CSW sent discharge information and confirmed with wife plan to admit to [facility] today. Transport scheduled with [facility] for next available.  ? ?Nurse to call report to [phone], Room 3222. ? ? ? ?Final next level of care: [place] ?Barriers to Discharge: Barriers Resolved ? ? ?Patient Goals and CMS Choice ?Patient states their goals for this hospitalization and ongoing recovery are:: patient unable to participate in goal setting, not fully oriented ?CMS Medicare.gov Compare Post Acute Care list provided to:: Patient Represenative (must comment) ?Choice offered to / list presented to : Spouse ? ?Discharge Placement ?  ?           ?Patient chooses bed at: [facility] ?Patient to be transferred to facility by: [facility] ?Name of family member notified: Spouse at bedside ?Patient and family notified of of transfer: 11/28/21 ? ?Discharge Plan and Services ?  ?  ?Post Acute Care Choice: [place]          ?  ?  ?  ?  ?  ?  ?  ?  ?  ?  ? ?Social Determinants of Health (SDOH) Interventions ?  ? ? ?Readmission Risk Interventions ? ?  02/02/2021  ?  2:10 PM  ?Readmission Risk Prevention Plan  ?Medication Screening Complete  ?Transportation Screening Complete  ? ? ? ? ? ?

## 2021-11-28 NOTE — Progress Notes (Signed)
Physical Therapy Treatment ?Patient Details ?Name: Bernard Ramirez ?MRN: [id number] ?DOB: 1930/09/18 ?Today's Date: 11/28/2021 ? ? ?History of Present Illness Pt. is an 88 y.o. M presenting to [facility] on 4/4 due to weakness while complaining of N/V. Wife noted significant weakness, lethargy, and drowsiness for previous few days. MRI shows multiple infarctions in mid cerebellum in the R PICA area. PMH significant for A-fib, BPH and history of stage IV prostate cancer 2017, peripheral neuropathy, and HTN. ? ?  ?PT Comments  ? ? Pt drowsy upon PT arrival to room, and unaware he is heavily soiled in stool. Pt overall mobilizing at mod physical assist level today, tolerating transfer into semi-stand position only secondary to feeling "woozy" and fatigued, BP WFL. PT assisted pt in cleaning up BM upon return to supine and RN notified. Pt remains appropriate for d/c to SNF level of care post-acutely.  ? ?   ?Recommendations for follow up therapy are one component of a multi-disciplinary discharge planning process, led by the attending physician.  Recommendations may be updated based on patient status, additional functional criteria and insurance authorization. ? ?Follow Up Recommendations ? Skilled nursing-short term rehab (<3 hours/day) ?  ?  ?Assistance Recommended at Discharge Frequent or constant Supervision/Assistance  ?Patient can return home with the following A lot of help with walking and/or transfers;A lot of help with bathing/dressing/bathroom;Assist for transportation;Direct supervision/assist for financial management;Help with stairs or ramp for entrance;Direct supervision/assist for medications management ?  ?Equipment Recommendations ? None recommended by PT  ?  ?Recommendations for Other Services   ? ? ?  ?Precautions / Restrictions Precautions ?Precautions: Fall ?Restrictions ?Weight Bearing Restrictions: No  ?  ? ?Mobility ? Bed Mobility ?Overal bed mobility: Needs Assistance ?Bed Mobility: Rolling, Sidelying to  Sit, Sit to Supine ?Rolling: Mod assist ?Sidelying to sit: Mod assist ?  ?Sit to supine: Mod assist ?  ?General bed mobility comments: assist for trunk and LE management, step-by-step cues for sequencing ?  ? ?Transfers ?Overall transfer level: Needs assistance ?Equipment used: Rolling walker (2 wheels) ?Transfers: Sit to/from Stand ?Sit to Stand: Mod assist, From elevated surface ?  ?  ?  ?  ?  ?General transfer comment: mod assist for power up, rise, steadying. Pt unable to come to full standing with max multimodal cuing. Pt heavily soiled in stool and complaining of wooziness, return to supine. ?  ? ?Ambulation/Gait ?  ?  ?  ?  ?  ?  ?  ?  ? ? ?Stairs ?  ?  ?  ?  ?  ? ? ?Wheelchair Mobility ?  ? ?Modified Rankin (Stroke Patients Only) ?Modified Rankin (Stroke Patients Only) ?Pre-Morbid Rankin Score: Slight disability ?Modified Rankin: Moderately severe disability ? ? ?  ?Balance Overall balance assessment: Needs assistance ?Sitting-balance support: Feet supported ?Sitting balance-Leahy Scale: Fair ?  ?Postural control: Left lateral lean ?Standing balance support: Bilateral upper extremity supported, Reliant on assistive device for balance, During functional activity ?Standing balance-Leahy Scale: Poor ?  ?  ?  ?  ?  ?  ?  ?  ?  ?  ?  ?  ?  ? ?  ?Cognition Arousal/Alertness: Awake/alert ?Behavior During Therapy: Flat affect ?Overall Cognitive Status: Impaired/Different from baseline ?Area of Impairment: Attention, Memory, Following commands, Safety/judgement, Awareness, Problem solving, Orientation ?  ?  ?  ?  ?  ?  ?  ?  ?Orientation Level: Situation, Disoriented to, Place ?Current Attention Level: Selective ?Memory: Decreased recall of precautions, Decreased short-term  memory ?Following Commands: Follows multi-step commands inconsistently, Follows one step commands with increased time ?Safety/Judgement: Decreased awareness of safety, Decreased awareness of deficits ?Awareness: Emergent ?Problem Solving: Slow  processing, Decreased initiation, Difficulty sequencing, Requires verbal cues, Requires tactile cues ?General Comments: pt drowsy today, unaware that he had soiled himself until mobilizing. Pt requires very increased time to follow one-step commands, oriented to city of [place] but not which hospital. ?  ?  ? ?  ?Exercises   ? ?  ?General Comments   ?  ?  ? ?Pertinent Vitals/Pain Pain Assessment ?Pain Assessment: No/denies pain ?Pain Intervention(s): Monitored during session  ? ? ?Home Living   ?  ?  ?  ?  ?  ?  ?  ?  ?  ?   ?  ?Prior Function    ?  ?  ?   ? ?PT Goals (current goals can now be found in the care plan section) Acute Rehab PT Goals ?Patient Stated Goal: To improve walking and strength to return home ?PT Goal Formulation: With patient ?Time For Goal Achievement: 12/07/21 ?Potential to Achieve Goals: Fair ?Progress towards PT goals: Progressing toward goals ? ?  ?Frequency ? ? ? Min 3X/week ? ? ? ?  ?PT Plan Current plan remains appropriate  ? ? ?Co-evaluation   ?  ?  ?  ?  ? ?  ?AM-PAC PT "6 Clicks" Mobility   ?Outcome Measure ? Help needed turning from your back to your side while in a flat bed without using bedrails?: A Lot ?Help needed moving from lying on your back to sitting on the side of a flat bed without using bedrails?: A Lot ?Help needed moving to and from a bed to a chair (including a wheelchair)?: A Lot ?Help needed standing up from a chair using your arms (e.g., wheelchair or bedside chair)?: A Lot ?Help needed to walk in hospital room?: Total ?Help needed climbing 3-5 steps with a railing? : Total ?6 Click Score: 10 ? ?  ?End of Session   ?Activity Tolerance: Patient limited by fatigue ?Patient left: in bed;with call bell/phone within reach;with bed alarm set ?Nurse Communication: Mobility status;Other (comment) (needs new primofit and would benefit from washup and linen change - pads changed and stool cleaned off pt by PT) ?PT Visit Diagnosis: Unsteadiness on feet (R26.81);Muscle  weakness (generalized) (M62.81);Difficulty in walking, not elsewhere classified (R26.2) ?  ? ? ?Time: 3817-7116 ?PT Time Calculation (min) (ACUTE ONLY): 27 min ? ?Charges:  $Therapeutic Activity: 8-22 mins ?$Self Care/Home Management: 8-22          ?          ?Stacie Glaze, PT DPT ?Acute Rehabilitation Services ?Pager [phone]  ?Office [phone] ? ? ? ?Bernard Ramirez ?11/28/2021, 10:38 AM ? ?

## 2021-11-28 NOTE — Progress Notes (Signed)
Report given to [place] at [place]. ?

## 2021-11-28 NOTE — Progress Notes (Signed)
PTAR picked up patient.  Patient discharged to [facility].   ?

## 2021-11-29 DIAGNOSIS — K5900 Constipation, unspecified: Secondary | ICD-10-CM | POA: Diagnosis not present

## 2021-11-29 DIAGNOSIS — I639 Cerebral infarction, unspecified: Secondary | ICD-10-CM | POA: Diagnosis not present

## 2021-11-29 DIAGNOSIS — N400 Enlarged prostate without lower urinary tract symptoms: Secondary | ICD-10-CM | POA: Diagnosis not present

## 2021-11-29 DIAGNOSIS — R001 Bradycardia, unspecified: Secondary | ICD-10-CM | POA: Diagnosis not present

## 2021-11-29 DIAGNOSIS — C61 Malignant neoplasm of prostate: Secondary | ICD-10-CM | POA: Diagnosis not present

## 2021-11-29 DIAGNOSIS — R42 Dizziness and giddiness: Secondary | ICD-10-CM | POA: Diagnosis not present

## 2021-11-29 DIAGNOSIS — I10 Essential (primary) hypertension: Secondary | ICD-10-CM | POA: Diagnosis not present

## 2021-11-29 DIAGNOSIS — E785 Hyperlipidemia, unspecified: Secondary | ICD-10-CM | POA: Diagnosis not present

## 2021-11-29 DIAGNOSIS — R609 Edema, unspecified: Secondary | ICD-10-CM | POA: Diagnosis not present

## 2021-11-29 DIAGNOSIS — N179 Acute kidney failure, unspecified: Secondary | ICD-10-CM | POA: Diagnosis not present

## 2021-11-29 DIAGNOSIS — K219 Gastro-esophageal reflux disease without esophagitis: Secondary | ICD-10-CM | POA: Diagnosis not present

## 2021-11-29 DIAGNOSIS — I4891 Unspecified atrial fibrillation: Secondary | ICD-10-CM | POA: Diagnosis not present

## 2021-11-30 DIAGNOSIS — G9340 Encephalopathy, unspecified: Secondary | ICD-10-CM | POA: Diagnosis not present

## 2021-11-30 DIAGNOSIS — E785 Hyperlipidemia, unspecified: Secondary | ICD-10-CM | POA: Diagnosis not present

## 2021-11-30 DIAGNOSIS — I10 Essential (primary) hypertension: Secondary | ICD-10-CM | POA: Diagnosis not present

## 2021-11-30 DIAGNOSIS — I639 Cerebral infarction, unspecified: Secondary | ICD-10-CM | POA: Diagnosis not present

## 2021-12-01 DIAGNOSIS — N179 Acute kidney failure, unspecified: Secondary | ICD-10-CM | POA: Diagnosis not present

## 2021-12-01 DIAGNOSIS — E785 Hyperlipidemia, unspecified: Secondary | ICD-10-CM | POA: Diagnosis not present

## 2021-12-01 DIAGNOSIS — I10 Essential (primary) hypertension: Secondary | ICD-10-CM | POA: Diagnosis not present

## 2021-12-01 DIAGNOSIS — R609 Edema, unspecified: Secondary | ICD-10-CM | POA: Diagnosis not present

## 2021-12-01 DIAGNOSIS — I4891 Unspecified atrial fibrillation: Secondary | ICD-10-CM | POA: Diagnosis not present

## 2021-12-05 DIAGNOSIS — I4891 Unspecified atrial fibrillation: Secondary | ICD-10-CM | POA: Diagnosis not present

## 2021-12-05 DIAGNOSIS — I639 Cerebral infarction, unspecified: Secondary | ICD-10-CM | POA: Diagnosis not present

## 2021-12-05 DIAGNOSIS — K219 Gastro-esophageal reflux disease without esophagitis: Secondary | ICD-10-CM | POA: Diagnosis not present

## 2021-12-05 DIAGNOSIS — I10 Essential (primary) hypertension: Secondary | ICD-10-CM | POA: Diagnosis not present

## 2021-12-15 ENCOUNTER — Telehealth: Payer: Self-pay | Admitting: Family Medicine

## 2021-12-15 ENCOUNTER — Ambulatory Visit: Payer: PPO | Admitting: Family Medicine

## 2021-12-15 NOTE — Telephone Encounter (Signed)
Wife requesting refill on his hydrocodone I told her that I didn't see it on his list and it looked like it was d/c on 11/28/2021. ? ?CB#[phone] ?

## 2021-12-15 NOTE — Telephone Encounter (Signed)
Per chart patient's hydrocodone was d/c'd at discharge from the hospital: ?Discontinued by: Flora Lipps, MD on 11/28/2021 15:16  ?Reason: Stop Taking at Discharge  ? ?Per chart patient is to be followed by PCP at [facility]. Patient's wife aware to contact SNF staff for any concerns about pain and medications.  ?

## 2021-12-21 DIAGNOSIS — I10 Essential (primary) hypertension: Secondary | ICD-10-CM | POA: Diagnosis not present

## 2021-12-21 DIAGNOSIS — K219 Gastro-esophageal reflux disease without esophagitis: Secondary | ICD-10-CM | POA: Diagnosis not present

## 2021-12-21 DIAGNOSIS — I4891 Unspecified atrial fibrillation: Secondary | ICD-10-CM | POA: Diagnosis not present

## 2021-12-23 DIAGNOSIS — K219 Gastro-esophageal reflux disease without esophagitis: Secondary | ICD-10-CM | POA: Diagnosis not present

## 2021-12-23 DIAGNOSIS — I10 Essential (primary) hypertension: Secondary | ICD-10-CM | POA: Diagnosis not present

## 2021-12-23 DIAGNOSIS — I4891 Unspecified atrial fibrillation: Secondary | ICD-10-CM | POA: Diagnosis not present

## 2021-12-27 DIAGNOSIS — I480 Paroxysmal atrial fibrillation: Secondary | ICD-10-CM | POA: Diagnosis not present

## 2021-12-27 DIAGNOSIS — Z8673 Personal history of transient ischemic attack (TIA), and cerebral infarction without residual deficits: Secondary | ICD-10-CM | POA: Diagnosis not present

## 2021-12-27 DIAGNOSIS — I10 Essential (primary) hypertension: Secondary | ICD-10-CM | POA: Diagnosis not present

## 2021-12-27 DIAGNOSIS — Z8546 Personal history of malignant neoplasm of prostate: Secondary | ICD-10-CM | POA: Diagnosis not present

## 2021-12-27 DIAGNOSIS — N179 Acute kidney failure, unspecified: Secondary | ICD-10-CM | POA: Diagnosis not present

## 2021-12-27 DIAGNOSIS — R001 Bradycardia, unspecified: Secondary | ICD-10-CM | POA: Diagnosis not present

## 2021-12-27 DIAGNOSIS — I639 Cerebral infarction, unspecified: Secondary | ICD-10-CM | POA: Diagnosis not present

## 2021-12-27 DIAGNOSIS — R680 Hypothermia, not associated with low environmental temperature: Secondary | ICD-10-CM | POA: Diagnosis not present

## 2021-12-27 DIAGNOSIS — K219 Gastro-esophageal reflux disease without esophagitis: Secondary | ICD-10-CM | POA: Diagnosis not present

## 2021-12-27 DIAGNOSIS — N401 Enlarged prostate with lower urinary tract symptoms: Secondary | ICD-10-CM | POA: Diagnosis not present

## 2021-12-27 DIAGNOSIS — G9341 Metabolic encephalopathy: Secondary | ICD-10-CM | POA: Diagnosis not present

## 2021-12-27 DIAGNOSIS — E785 Hyperlipidemia, unspecified: Secondary | ICD-10-CM | POA: Diagnosis not present

## 2021-12-30 ENCOUNTER — Other Ambulatory Visit: Payer: Self-pay | Admitting: Internal Medicine

## 2022-01-05 ENCOUNTER — Other Ambulatory Visit: Payer: Self-pay | Admitting: Family Medicine

## 2022-01-05 DIAGNOSIS — M79641 Pain in right hand: Secondary | ICD-10-CM | POA: Diagnosis not present

## 2022-01-05 DIAGNOSIS — I4891 Unspecified atrial fibrillation: Secondary | ICD-10-CM | POA: Diagnosis not present

## 2022-01-05 DIAGNOSIS — M25531 Pain in right wrist: Secondary | ICD-10-CM | POA: Diagnosis not present

## 2022-01-05 DIAGNOSIS — K219 Gastro-esophageal reflux disease without esophagitis: Secondary | ICD-10-CM | POA: Diagnosis not present

## 2022-01-05 DIAGNOSIS — M19041 Primary osteoarthritis, right hand: Secondary | ICD-10-CM | POA: Diagnosis not present

## 2022-01-05 DIAGNOSIS — I10 Essential (primary) hypertension: Secondary | ICD-10-CM | POA: Diagnosis not present

## 2022-01-05 DIAGNOSIS — M19031 Primary osteoarthritis, right wrist: Secondary | ICD-10-CM | POA: Diagnosis not present

## 2022-01-05 NOTE — Telephone Encounter (Signed)
Requested Prescriptions  Pending Prescriptions Disp Refills  . pravastatin (PRAVACHOL) 20 MG tablet [Pharmacy Med Name: PRAVASTATIN '20MG'$  TABLETS] 90 tablet 1    Sig: TAKE 1 TABLET(20 MG) BY MOUTH DAILY     There is no refill protocol information for this order

## 2022-01-06 ENCOUNTER — Other Ambulatory Visit: Payer: Self-pay | Admitting: Family Medicine

## 2022-01-06 ENCOUNTER — Telehealth: Payer: Self-pay

## 2022-01-06 DIAGNOSIS — E119 Type 2 diabetes mellitus without complications: Secondary | ICD-10-CM | POA: Diagnosis not present

## 2022-01-06 DIAGNOSIS — I6310 Cerebral infarction due to embolism of unspecified precerebral artery: Secondary | ICD-10-CM

## 2022-01-06 DIAGNOSIS — E559 Vitamin D deficiency, unspecified: Secondary | ICD-10-CM | POA: Diagnosis not present

## 2022-01-06 NOTE — Telephone Encounter (Addendum)
Spoke [facility] intake referral for [employer].  Per Dr. Dennard Schaumann is in aggreemen of the following:  Requesting a hospice referral; will you service pt attending hospice care and agreeing that pt has 6 mos or less.  01/09/22

## 2022-01-06 NOTE — Telephone Encounter (Signed)
Tammy w/[facility] is :  Requesting a hospice referral; will you service pt attending hospice care and agreeing that pt has 6 mos or less? Tammy, stated that the Family has reached out to you regarding this.   Per Tammy, pt's family is planning on taken him home on Monday, per wife spoke with you?   Please advice

## 2022-01-07 DIAGNOSIS — R918 Other nonspecific abnormal finding of lung field: Secondary | ICD-10-CM | POA: Diagnosis not present

## 2022-01-10 DIAGNOSIS — I4891 Unspecified atrial fibrillation: Secondary | ICD-10-CM | POA: Diagnosis not present

## 2022-01-10 DIAGNOSIS — M25531 Pain in right wrist: Secondary | ICD-10-CM | POA: Diagnosis not present

## 2022-01-10 DIAGNOSIS — I10 Essential (primary) hypertension: Secondary | ICD-10-CM | POA: Diagnosis not present

## 2022-01-10 DIAGNOSIS — J189 Pneumonia, unspecified organism: Secondary | ICD-10-CM | POA: Diagnosis not present

## 2022-01-12 DIAGNOSIS — I10 Essential (primary) hypertension: Secondary | ICD-10-CM | POA: Diagnosis not present

## 2022-01-17 ENCOUNTER — Non-Acute Institutional Stay: Payer: Self-pay | Admitting: Family Medicine

## 2022-01-17 ENCOUNTER — Encounter: Payer: Self-pay | Admitting: Family Medicine

## 2022-01-17 VITALS — BP 84/48 | HR 59 | Resp 22

## 2022-01-17 DIAGNOSIS — I959 Hypotension, unspecified: Secondary | ICD-10-CM

## 2022-01-17 DIAGNOSIS — E538 Deficiency of other specified B group vitamins: Secondary | ICD-10-CM

## 2022-01-18 DIAGNOSIS — E119 Type 2 diabetes mellitus without complications: Secondary | ICD-10-CM | POA: Diagnosis not present

## 2022-01-19 ENCOUNTER — Encounter: Payer: Self-pay | Admitting: Family Medicine

## 2022-01-19 ENCOUNTER — Telehealth: Payer: Self-pay

## 2022-01-19 DIAGNOSIS — I4891 Unspecified atrial fibrillation: Secondary | ICD-10-CM | POA: Diagnosis not present

## 2022-01-19 DIAGNOSIS — J189 Pneumonia, unspecified organism: Secondary | ICD-10-CM | POA: Diagnosis not present

## 2022-01-19 DIAGNOSIS — E538 Deficiency of other specified B group vitamins: Secondary | ICD-10-CM | POA: Insufficient documentation

## 2022-01-19 DIAGNOSIS — I959 Hypotension, unspecified: Secondary | ICD-10-CM | POA: Insufficient documentation

## 2022-01-19 DIAGNOSIS — I10 Essential (primary) hypertension: Secondary | ICD-10-CM | POA: Diagnosis not present

## 2022-01-19 DIAGNOSIS — M25531 Pain in right wrist: Secondary | ICD-10-CM | POA: Diagnosis not present

## 2022-01-19 NOTE — Progress Notes (Signed)
Therapist, nutritional Palliative Care Consult Note Telephone: [phone]  Fax: [phone]   Date of encounter: 01/17/2022 5:30 PM PATIENT NAME: Bernard Ramirez [address] [place] [address]   [phone] (home)  DOB: Aug 20, 1931 MRN: [id number] PRIMARY CARE PROVIDER:    Donita Brooks, MD,  [address] [place] [address] [phone]  REFERRING PROVIDER:   Donita Brooks, MD [address] [place],  [place] [address] [phone]  RESPONSIBLE PARTY:    Contact Information     Name Relation Home Work Mobile   Arav, Bannister [phone]  [phone]        I met face to face with patient and wife in skilled nursing facility. Palliative Care was asked to follow this patient by consultation request of  Donita Brooks, MD to address advance care planning and complex medical decision making. This is the initial visit.          ASSESSMENT, SYMPTOM MANAGEMENT AND PLAN / RECOMMENDATIONS:  Hypotension Recent treatment for CAP, has visible signs of dehydration. D/C Losartan and Lasix. VS TID and notify attending for SBP < 90. CBC, CMP in am Push fluids  2.  Vitamin B12 deficiency No noted Vitamin B12 supplement. Last Vitamin B12 level 198 on 09/15/21, has longstanding deficiency Vitamin B12 level in am    Follow up Palliative Care Visit: Palliative care will continue to follow for complex medical decision making, advance care planning, and clarification of goals. Return 1 weeks or prn.    This visit was coded based on medical decision making (MDM).  PPS: 30%  HOSPICE ELIGIBILITY/DIAGNOSIS: TBD  Chief Complaint:  Civil engineer, contracting Palliative Care received a referral to follow up with patient for chronic disease management following a CVA.  HISTORY OF PRESENT ILLNESS:  Bernard Ramirez is a 88 y.o. year old male with recent CVA in April 2023 who has recently received treatment for pneumonia.   He also has HTN, persistent atrial fibrillation, CHF, OSA intolerant of CPAP, GERD, peripheral neuropathy, arthritis, BPH, hx of prostate cancer, hx of AKI on CKD, essential tremors and vertigo, and protein calorie malnutrition.  Have been by twice to see pt today, initially son was at bedside and requested I come back when wife present.  Stopped by again and wife was away from bedside.  On return this evening, spoke with wife Fontaine No.  Pt has DNR/DNI with comfort options, antibiotics and IV fluids on case by case time limited basis.  He is noted to by lying in bed open mouth breathing and intermittently snoring.  Wife states he had a cough productive of thick, white foamy phlegm which has improved with treatment with Augmentin.  States diet was changed from regular to soft/chopped following pneumonia which she states pt will not eat.  She brings him lunch and dinner and feeds him daily, denies any difficulty with coughing or choking on regular foods. She states pt is not able to walk, bathe or dress himself currently because he is "too weak, when he gets up he can't stand for more than 30 seconds because he starts to shake really bad and has to sit down."  She was unaware of any difficulty with low blood pressure prior to today and would like him to have IV fluids if dehydrated.  Review of his med list shows pt is receiving Lasix 40 mg 1 1/2 tabs in am, 1 tab at 2 pm for total of 100 mg  daily.  He is also on Losartan 100 mg daily.  Pt states he has some lightheadedness "sometimes", denies pain, nausea, vomiting, CP, SOB, dyspnea or PND.  Wife states in the recent past his legs were so edematous they were blistered and weeping. He is currently incontinent. Last echo done 11/23/21: EF 50-55% with global hypokinesis, unable to determine diastolic function.  RV severely enlarged with severely elevated RVSP 72.1 mm HG.  Moderate to severe bi-atrial enlargement, mild aortic stenosis. MRI/MRA brain/head: Diffusion  imaging shows numerous scattered acute infarctions within the inferior to mid cerebellum on the right. No mass effect or visible hemorrhage. Cerebral hemispheres show old infarctions in both occipital regions, the inferior left frontal lobe, and extensively throughout the cerebral hemispheric white matter.  Spoke with PCP who states pt has had episodic edema of BLE in past with no significant hx of hypotension. BMP 11/25/21 showed CR q.05 improved from 1.28 with eGFR >60 and mild low NA 134.  History obtained from review of EMR, discussion with primary team, and interview with wife, facility staff/caregiver and/or Mr. Ybarbo.  I reviewed available labs, medications, imaging, studies and related documents from the EMR.  Records reviewed and summarized above.   ROS obtained from pt, supplemented by wife General: NAD EYES: denies vision changes ENMT: denies dysphagia Cardiovascular: denies chest pain, denies DOE Pulmonary: denies cough, denies increased SOB Abdomen: endorses good appetite, denies constipation, endorses incontinence of bowel GU: denies dysuria, endorses incontinence of urine MSK:  endorses increased generalized weakness although no specific paralysis, no recent falls reported Skin: denies rashes or wounds Neurological: denies pain, denies insomnia, has essential tremors and vertigo Psych: Endorses positive mood Heme/lymph/immuno: denies bruises, abnormal bleeding  Physical Exam: Current and past weights: Last weight 11/22/21 was 231 lbs 7.7 ounces and weights are being done today at facility Constitutional: NAD General: frail appearing EYES: anicteric sclera, lids intact, no discharge  ENMT: intact hearing, oral mucous membranes dry and tacky, dentition intact CV: S1S2, slow/slightly irregular, no LE edema Pulmonary: CTAB/diminished in bilat bases, no increased work of breathing, no cough, room air Abdomen:  normo-active BS + 4 quadrants, soft and non tender, no ascites GU:  deferred MSK: no sarcopenia, moves all extremities, ambulatory Skin: warm and dry, no rashes or wounds on visible skin Neuro:  noted generalized weakness, no cognitive impairment Psych: non-anxious affect, drowsy/arousable and Oriented Hem/lymph/immuno: no widespread bruising  CURRENT PROBLEM LIST:  Patient Active Problem List   Diagnosis Date Noted   Acute ischemic stroke ([place]) 11/22/2021   Hypothermia 11/22/2021   Sinus bradycardia 11/22/2021   GERD without esophagitis 02/18/2021   Hypokalemia 02/18/2021   Hyperlipidemia LDL goal <100 02/18/2021   Hypertensive heart disease with congestive heart failure ([place]) 02/18/2021   Snoring 02/08/2021   Failure to thrive in adult    Cellulitis 02/01/2021   Social problem 02/01/2021   Protein calorie malnutrition ([place]) 02/01/2021   Anxiety 02/01/2021   Congestive heart failure (CHF) ([place]) 10/08/2016   AKI (acute kidney injury) ([place]) 10/08/2016   Acute renal insufficiency due to procedure 04/13/2016   BPH (benign prostatic hypertrophy) with urinary obstruction 04/11/2016   Shortness of breath 09/27/2015   Absolute anemia    ED (erectile dysfunction) of organic origin 11/06/2014   Benign non-nodular prostatic hyperplasia with lower urinary tract symptoms 11/06/2014   Increased frequency of urination 10/12/2014   Post-traumatic bulbous urethral stricture 10/12/2014   Atrial fibrillation by electrocardiogram ([place]) 11/08/2012   Former smoker 11/08/2012   Chronic kidney disease,  stage II (mild) 07/11/2012   Vertigo    Persistent atrial fibrillation ([facility])    Anemia 06/10/2009   Essential hypertension 06/10/2009   Back pain 06/10/2009   DIARRHEA, CHRONIC 06/10/2009   DIVERTICULITIS, HX OF 06/10/2009   PAST MEDICAL HISTORY:  Active Ambulatory Problems    Diagnosis Date Noted   Anemia 06/10/2009   Essential hypertension 06/10/2009   Back pain 06/10/2009   DIARRHEA, CHRONIC 06/10/2009   DIVERTICULITIS, HX OF 06/10/2009   Vertigo     Persistent atrial fibrillation ([facility])    Chronic kidney disease, stage II (mild) 07/11/2012   Absolute anemia    Shortness of breath 09/27/2015   BPH (benign prostatic hypertrophy) with urinary obstruction 04/11/2016   Acute renal insufficiency due to procedure 04/13/2016   Congestive heart failure (CHF) ([place]) 10/08/2016   AKI (acute kidney injury) ([place]) 10/08/2016   Atrial fibrillation by electrocardiogram ([place]) 11/08/2012   Former smoker 11/08/2012   Increased frequency of urination 10/12/2014   ED (erectile dysfunction) of organic origin 11/06/2014   Post-traumatic bulbous urethral stricture 10/12/2014   Benign non-nodular prostatic hyperplasia with lower urinary tract symptoms 11/06/2014   Cellulitis 02/01/2021   Social problem 02/01/2021   Protein calorie malnutrition ([place]) 02/01/2021   Anxiety 02/01/2021   Failure to thrive in adult    Snoring 02/08/2021   GERD without esophagitis 02/18/2021   Hypokalemia 02/18/2021   Hyperlipidemia LDL goal <100 02/18/2021   Hypertensive heart disease with congestive heart failure ([place]) 02/18/2021   Acute ischemic stroke ([place]) 11/22/2021   Hypothermia 11/22/2021   Sinus bradycardia 11/22/2021   Resolved Ambulatory Problems    Diagnosis Date Noted   HEMOCCULT POSITIVE STOOL 06/10/2009   PEPTIC ULCER, ACUTE, HEMORRHAGE, HX OF 06/10/2009   BENIGN PROSTATIC HYPERTROPHY, HX OF 06/10/2009   ROTATOR CUFF TEAR 08/31/2010   Chest pain    Fasting hyperglycemia 07/11/2012   Melena 05/11/2015   Rectal bleeding 05/11/2015   Abnormal x-ray of abdomen 05/11/2015   Preop cardiovascular exam 09/27/2015   Prostate cancer ([place]) 04/13/2016   Cellulitis of lower extremity 10/08/2016   Congestive heart failure (CHF) ([place]) 10/08/2016   Elevated blood pressure 11/08/2012   Urinary urgency 10/12/2014   Urine stream spraying 11/06/2014   Past Medical History:  Diagnosis Date   Arthritis    Benign prostatic hypertrophy    CVA (cerebral infarction)     Dysrhythmia    Edema of both legs    GERD (gastroesophageal reflux disease)    History of blood transfusion    History of kidney stones    History of urinary tract infection    Hypertension    Jerking movements of extremities    Low back pain    Obstructive sleep apnea    Peripheral neuropathy    Pneumonia    Shortness of breath dyspnea    Upper GI bleed 1985   Urinary hesitancy    SOCIAL HX:  Social History   Tobacco Use   Smoking status: Former    Types: Cigars    Quit date: 08/22/2003    Years since quitting: 18.4   Smokeless tobacco: Never   Tobacco comments:    Quit many years ago; few cigars per day; didn't inhale  Substance Use Topics   Alcohol use: No    Alcohol/week: 1.0 standard drink    Types: 1 Standard drinks or equivalent per week   FAMILY HX:  Family History  Problem Relation Age of Onset   Diabetes Mellitus II Mother  Allergic rhinitis Mother    Lung disease Father    Allergic rhinitis Father    Angioedema Neg Hx    Asthma Neg Hx    Atopy Neg Hx    Eczema Neg Hx    Immunodeficiency Neg Hx    Urticaria Neg Hx    Cystic fibrosis Neg Hx    Lupus Neg Hx    Emphysema Neg Hx    Migraines Neg Hx        Preferred Pharmacy: ALLERGIES:  Allergies  Allergen Reactions   Contrast Media [Iodinated Contrast Media]     Pt states he was given "dye" 20 yrs ago, anaphylaxis & syncope - was told to never have it again    Gabapentin Itching   Aspirin Other (See Comments)    Blistering    Latex Rash   Lipitor [Atorvastatin] Itching   Tape Rash    EKG leads     PERTINENT MEDICATIONS:  Outpatient Encounter Medications as of 01/17/2022  Medication Sig   clobetasol (TEMOVATE) 0.05 % external solution Apply 1 application topically 2 (two) times daily.   clobetasol cream (TEMOVATE) 6.76 % Apply 1 application topically daily as needed (use as directed for skin).   clopidogrel (PLAVIX) 75 MG tablet TAKE 1 TABLET(75 MG) BY MOUTH DAILY   furosemide (LASIX) 40  MG tablet TAKE 1 AND 1/2 TABLETS BY MOUTH EVERY MORNING THEN TAKE 1 TABLET BY MOUTH EVERY AFTERNOON   losartan (COZAAR) 100 MG tablet TAKE 1 TABLET(100 MG) BY MOUTH DAILY   meclizine (ANTIVERT) 25 MG tablet Take one tablet by mouth at bedtime.   NON FORMULARY Diet:Regular, NAS   NYSTATIN powder APPLY TOPICALLY TO THE AFFECTED AREA EVERY DAY AS NEEDED FOR RASH   pantoprazole (PROTONIX) 40 MG tablet TAKE 1 TABLET(40 MG) BY MOUTH DAILY   polyethylene glycol (MIRALAX / GLYCOLAX) 17 g packet Take 17 g by mouth daily as needed for moderate constipation or severe constipation.   potassium chloride SA (KLOR-CON) 20 MEQ tablet TAKE 1 TABLET(20 MEQ) BY MOUTH DAILY   pravastatin (PRAVACHOL) 20 MG tablet TAKE 1 TABLET(20 MG) BY MOUTH DAILY   senna-docusate (SENOKOT-S) 8.6-50 MG tablet Take 1 tablet by mouth at bedtime.   triamcinolone cream (KENALOG) 0.1 % Apply 1 application topically daily as needed (for irritation).   No facility-administered encounter medications on file as of 01/17/2022.     ------------------------------------------------------------------------------------------- Advance Care Planning/Goals of Care: Goals include to maximize quality of life and symptom management. Health care surrogate gave her permission to discuss.Our advance care planning conversation included a discussion about:     Exploration of goals of care in the event of a sudden injury or illness-would like him to have IV fluids if indicated Identification  of a healthcare agent-wife Audelia Hives Review of an existing advance directive document-MOST . Decision not to resuscitate or to de-escalate disease focused treatments due to poor prognosis. CODE STATUS: MOST: DNR/DNI Comfort measures Antibiotics and IV fluids on case by case time limited basis     Thank you for the opportunity to participate in the care of Mr. Sunde.  The palliative care team will continue to follow. Please call our office at [phone] if  we can be of additional assistance.   Marijo Conception, FNP-C  COVID-19 PATIENT SCREENING TOOL Asked and negative response unless otherwise noted:  Have you had symptoms of covid, tested positive or been in contact with someone with symptoms/positive test in the past 5-10 days?

## 2022-01-19 NOTE — Telephone Encounter (Signed)
Received  a call  from [place]   at [facility] pt no longer  wants to be on hospice care and would like to switch Palltive   care .

## 2022-01-20 ENCOUNTER — Encounter (HOSPITAL_COMMUNITY): Payer: Self-pay | Admitting: Emergency Medicine

## 2022-01-20 ENCOUNTER — Emergency Department (HOSPITAL_COMMUNITY)
Admission: EM | Admit: 2022-01-20 | Discharge: 2022-01-20 | Disposition: A | Discharge status: Skilled Nursing Facility | Payer: PPO | Attending: Emergency Medicine | Admitting: Emergency Medicine

## 2022-01-20 ENCOUNTER — Other Ambulatory Visit: Payer: Self-pay

## 2022-01-20 ENCOUNTER — Emergency Department (HOSPITAL_COMMUNITY): Payer: PPO

## 2022-01-20 ENCOUNTER — Other Ambulatory Visit: Payer: Self-pay | Admitting: Family Medicine

## 2022-01-20 DIAGNOSIS — S81812A Laceration without foreign body, left lower leg, initial encounter: Secondary | ICD-10-CM | POA: Diagnosis not present

## 2022-01-20 DIAGNOSIS — W19XXXA Unspecified fall, initial encounter: Secondary | ICD-10-CM

## 2022-01-20 DIAGNOSIS — I959 Hypotension, unspecified: Secondary | ICD-10-CM | POA: Diagnosis not present

## 2022-01-20 DIAGNOSIS — R58 Hemorrhage, not elsewhere classified: Secondary | ICD-10-CM | POA: Diagnosis not present

## 2022-01-20 DIAGNOSIS — S51012A Laceration without foreign body of left elbow, initial encounter: Secondary | ICD-10-CM | POA: Diagnosis not present

## 2022-01-20 DIAGNOSIS — S91311A Laceration without foreign body, right foot, initial encounter: Secondary | ICD-10-CM | POA: Insufficient documentation

## 2022-01-20 DIAGNOSIS — R410 Disorientation, unspecified: Secondary | ICD-10-CM | POA: Diagnosis not present

## 2022-01-20 DIAGNOSIS — R001 Bradycardia, unspecified: Secondary | ICD-10-CM | POA: Diagnosis not present

## 2022-01-20 DIAGNOSIS — Z043 Encounter for examination and observation following other accident: Secondary | ICD-10-CM | POA: Diagnosis not present

## 2022-01-20 DIAGNOSIS — D649 Anemia, unspecified: Secondary | ICD-10-CM | POA: Diagnosis not present

## 2022-01-20 DIAGNOSIS — W06XXXA Fall from bed, initial encounter: Secondary | ICD-10-CM | POA: Diagnosis not present

## 2022-01-20 DIAGNOSIS — Z91040 Latex allergy status: Secondary | ICD-10-CM | POA: Insufficient documentation

## 2022-01-20 DIAGNOSIS — R000 Tachycardia, unspecified: Secondary | ICD-10-CM | POA: Diagnosis not present

## 2022-01-20 DIAGNOSIS — S0101XA Laceration without foreign body of scalp, initial encounter: Secondary | ICD-10-CM | POA: Diagnosis not present

## 2022-01-20 DIAGNOSIS — S0003XA Contusion of scalp, initial encounter: Secondary | ICD-10-CM | POA: Diagnosis not present

## 2022-01-20 DIAGNOSIS — S0990XA Unspecified injury of head, initial encounter: Secondary | ICD-10-CM | POA: Diagnosis not present

## 2022-01-20 DIAGNOSIS — S99921A Unspecified injury of right foot, initial encounter: Secondary | ICD-10-CM | POA: Diagnosis present

## 2022-01-20 DIAGNOSIS — G9389 Other specified disorders of brain: Secondary | ICD-10-CM | POA: Diagnosis not present

## 2022-01-20 DIAGNOSIS — Z79899 Other long term (current) drug therapy: Secondary | ICD-10-CM | POA: Diagnosis not present

## 2022-01-20 DIAGNOSIS — S199XXA Unspecified injury of neck, initial encounter: Secondary | ICD-10-CM | POA: Diagnosis not present

## 2022-01-20 DIAGNOSIS — I6529 Occlusion and stenosis of unspecified carotid artery: Secondary | ICD-10-CM | POA: Diagnosis not present

## 2022-01-20 DIAGNOSIS — T07XXXA Unspecified multiple injuries, initial encounter: Secondary | ICD-10-CM | POA: Diagnosis not present

## 2022-01-20 DIAGNOSIS — I10 Essential (primary) hypertension: Secondary | ICD-10-CM | POA: Insufficient documentation

## 2022-01-20 DIAGNOSIS — Z743 Need for continuous supervision: Secondary | ICD-10-CM | POA: Diagnosis not present

## 2022-01-20 DIAGNOSIS — I639 Cerebral infarction, unspecified: Secondary | ICD-10-CM | POA: Diagnosis not present

## 2022-01-20 DIAGNOSIS — R531 Weakness: Secondary | ICD-10-CM | POA: Diagnosis not present

## 2022-01-20 LAB — COMPREHENSIVE METABOLIC PANEL
ALT: 17 U/L (ref 0–44)
AST: 21 U/L (ref 15–41)
Albumin: 2.8 g/dL — ABNORMAL LOW (ref 3.5–5.0)
Alkaline Phosphatase: 112 U/L (ref 38–126)
Anion gap: 9 (ref 5–15)
BUN: 9 mg/dL (ref 8–23)
CO2: 27 mmol/L (ref 22–32)
Calcium: 9.1 mg/dL (ref 8.9–10.3)
Chloride: 100 mmol/L (ref 98–111)
Creatinine, Ser: 0.88 mg/dL (ref 0.61–1.24)
GFR, Estimated: >60 mL/min (ref >60)
Glucose, Bld: 138 mg/dL — ABNORMAL HIGH (ref 70–99)
Potassium: 4.1 mmol/L (ref 3.5–5.1)
Sodium: 136 mmol/L (ref 135–145)
Total Bilirubin: 1.1 mg/dL (ref 0.3–1.2)
Total Protein: 6 g/dL — ABNORMAL LOW (ref 6.5–8.1)

## 2022-01-20 LAB — CBC
HCT: 37.2 % — ABNORMAL LOW (ref 39.0–52.0)
Hemoglobin: 11.4 g/dL — ABNORMAL LOW (ref 13.0–17.0)
MCH: 25.4 pg — ABNORMAL LOW (ref 26.0–34.0)
MCHC: 30.6 g/dL (ref 30.0–36.0)
MCV: 82.9 fL (ref 80.0–100.0)
Platelets: 225 10*3/uL (ref 150–400)
RBC: 4.49 MIL/uL (ref 4.22–5.81)
RDW: 18.1 % — ABNORMAL HIGH (ref 11.5–15.5)
WBC: 10.1 10*3/uL (ref 4.0–10.5)
nRBC: 0 % (ref 0.0–0.2)

## 2022-01-20 MED ORDER — LIDOCAINE-EPINEPHRINE (PF) 2 %-1:200000 IJ SOLN
10.0000 mL | Freq: Once | INTRAMUSCULAR | Status: AC
  Administered 2022-01-20: 10 mL
  Filled 2022-01-20: qty 20

## 2022-01-20 NOTE — Progress Notes (Signed)
Orthopedic Tech Progress Note Patient Details:  Bernard Ramirez [birth year] [id number] Level 2 Trauma Patient ID: JANARI YAMADA, male   DOB: 10-25-1930, 88 y.o.   MRN: [id number]  Chip Boer 01/20/2022, 8:22 AM

## 2022-01-20 NOTE — ED Triage Notes (Signed)
Pt to ER via EMS from [facility], unwitnessed fall from bed.  Reports that pt was reaching for a pill on the floor and rolled out of bed.  Pt hit face on floor, multiple skin tears noted.  Pt arrives in c-collar and at baseline mentation per EMS.

## 2022-01-20 NOTE — ED Provider Notes (Signed)
..  Laceration Repair  Date/Time: 01/20/2022 11:08 AM Performed by: Carlisle Cater, PA-C Authorized by: Carlisle Cater, PA-C   Consent:    Consent obtained:  Verbal   Consent given by:  Patient   Risks discussed:  Infection, nerve damage and pain Universal protocol:    Patient identity confirmed:  Verbally with patient and provided demographic data Anesthesia:    Anesthesia method:  Local infiltration   Local anesthetic:  Lidocaine 2% WITH epi Laceration details:    Location:  Foot   Foot location:  Top of R foot   Length (cm):  4 Pre-procedure details:    Preparation:  Patient was prepped and draped in usual sterile fashion Exploration:    Imaging outcome: foreign body not noted     Wound exploration: wound explored through full range of motion and entire depth of wound visualized     Contaminated: no   Treatment:    Area cleansed with:  Shur-Clens   Amount of cleaning:  Extensive Skin repair:    Repair method:  Sutures   Suture size:  4-0   Suture material:  Prolene   Suture technique:  Simple interrupted   Number of sutures:  8 Approximation:    Approximation:  Close Repair type:    Repair type:  Simple Post-procedure details:    Dressing:  Open (no dressing)   Procedure completion:  Tolerated well, no immediate complications    Carlisle Cater, PA-C 01/20/22 1109    Pattricia Boss, MD 01/25/22 1712

## 2022-01-20 NOTE — Telephone Encounter (Signed)
Requested Prescriptions  Pending Prescriptions Disp Refills  . losartan (COZAAR) 100 MG tablet [Pharmacy Med Name: LOSARTAN '100MG'$  TABLETS] 90 tablet 0    Sig: TAKE 1 TABLET(100 MG) BY MOUTH DAILY     Cardiovascular:  Angiotensin Receptor Blockers Failed - 01/20/2022  1:39 PM      Failed - Last BP in normal range    BP Readings from Last 1 Encounters:  01/20/22 (!) 145/59         Passed - Cr in normal range and within 180 days    Creat  Date Value Ref Range Status  09/15/2021 1.21 0.70 - 1.22 mg/dL Final   Creatinine, Ser  Date Value Ref Range Status  01/20/2022 0.88 0.61 - 1.24 mg/dL Final         Passed - K in normal range and within 180 days    Potassium  Date Value Ref Range Status  01/20/2022 4.1 3.5 - 5.1 mmol/L Final         Passed - Patient is not pregnant      Passed - Valid encounter within last 6 months    Recent Outpatient Visits          2 months ago Systolic congestive heart failure, unspecified HF chronicity ([place])   [place] [name], MD   2 months ago Systolic congestive heart failure, unspecified HF chronicity ([place])   [place] [name], MD   2 months ago Systolic congestive heart failure, unspecified HF chronicity ([place])   [place] Pickard, Cammie Mcgee, MD   3 months ago Neck mass   [place] Pickard, Cammie Mcgee, MD   4 months ago Controlled type 2 diabetes mellitus with complication, without long-term current use of insulin ([place])   [facility] Pickard, Cammie Mcgee, MD      Future Appointments            In 1 week Garvin Fila, MD [facility]

## 2022-01-20 NOTE — ED Provider Notes (Signed)
[facility] EMERGENCY DEPARTMENT Provider Note   CSN: [id number] Arrival date & time: 01/20/22  0800     History  Chief Complaint  Patient presents with   Lytle Michaels    ELIOR ROBINETTE is a 88 y.o. male.  HPI 88 year old male history of stroke, palliative care, DNR, presents today via EMS with report that he fell from bed.  Patient states that he was reaching for a pill on the floor overbalanced and fell to the floor.  There is a contusion to his forehead, laceration of bilateral lower extremities and no other acute abnormalities are noted.  Patient is not complaining of any injuries or pain.  C-Met reviewed and EMS reported that patient had an unwitnessed fall.  He arrived with fixed cervical collar and reports of baseline mentation.    Home Medications Prior to Admission medications   Medication Sig Start Date End Date Taking? Authorizing Provider  clobetasol (TEMOVATE) 0.05 % external solution Apply 1 application topically 2 (two) times daily. 03/14/21  Yes [name], MD  clopidogrel (PLAVIX) 75 MG tablet TAKE 1 TABLET(75 MG) BY MOUTH DAILY 02/17/21  Yes Gerlene Fee, NP  Diclofenac Sodium 1.5 % SOLN Apply topically as needed for pain. 01/19/22  Yes [provider]  [place] [facility]) OINT Apply 1 application. topically 2 (two) times daily. 01/14/22  Yes [provider]  meclizine (ANTIVERT) 25 MG tablet Take one tablet by mouth at bedtime. 07/28/21  Yes [name], MD  NYSTATIN powder APPLY TOPICALLY TO THE AFFECTED AREA EVERY DAY AS NEEDED FOR RASH Patient taking differently: Apply 1 application. topically as needed. 04/29/21  Yes [name], MD  pantoprazole (PROTONIX) 40 MG tablet TAKE 1 TABLET(40 MG) BY MOUTH DAILY Patient taking differently: Take 40 mg by mouth daily. TAKE 1 TABLET(40 MG) BY MOUTH DAILY 08/23/21  Yes Pickard, Cammie Mcgee, MD  Polyethyl Glycol-Propyl Glycol (SYSTANE) 0.4-0.3 % SOLN Apply 1-2 drops to  eye 4 (four) times daily as needed.   Yes [provider]  polyethylene glycol (MIRALAX / GLYCOLAX) 17 g packet Take 17 g by mouth daily as needed for moderate constipation or severe constipation. 11/28/21  Yes Pokhrel, Laxman, MD  potassium chloride SA (KLOR-CON) 20 MEQ tablet TAKE 1 TABLET(20 MEQ) BY MOUTH DAILY 02/17/21  Yes Gerlene Fee, NP  pravastatin (PRAVACHOL) 20 MG tablet TAKE 1 TABLET(20 MG) BY MOUTH DAILY Patient taking differently: Take 20 mg by mouth daily. TAKE 1 TABLET(20 MG) BY MOUTH DAILY 01/05/22  Yes [name], MD  senna-docusate (SENOKOT-S) 8.6-50 MG tablet Take 1 tablet by mouth at bedtime. 11/28/21  Yes Pokhrel, Laxman, MD  triamcinolone cream (KENALOG) 0.1 % Apply 1 application topically daily as needed (for irritation). 02/17/21  Yes Gerlene Fee, NP  tuberculin (TUBERSOL) 5 UNIT/0.1ML injection Inject 5 Units into the skin once. Every 14 days 11/28/21  Yes [provider]  clobetasol cream (TEMOVATE) 3.87 % Apply 1 application topically daily as needed (use as directed for skin). Patient not taking: Reported on 01/20/2022 02/17/21   Gerlene Fee, NP  furosemide (LASIX) 40 MG tablet TAKE 1 AND 1/2 TABLETS BY MOUTH EVERY MORNING THEN TAKE 1 TABLET BY MOUTH EVERY AFTERNOON Patient not taking: Reported on 01/20/2022 12/30/21   Thompson Grayer, MD  losartan (COZAAR) 100 MG tablet TAKE 1 TABLET(100 MG) BY MOUTH DAILY Patient not taking: Reported on 01/20/2022 10/24/21   [name], MD  NON FORMULARY Diet:Regular, NAS  [provider]      Allergies    Contrast media [iodinated contrast media], Gabapentin, Aspirin, Latex, Lipitor [atorvastatin], and Tape    Review of Systems   Review of Systems  Physical Exam Updated Vital Signs BP 118/62   Pulse 73   Temp 97.6 F (36.4 C) (Oral)   Resp 15   Ht 1.829 m (6')   Wt 104.3 kg   SpO2 97%   BMI 31.19 kg/m  Physical Exam Vitals and nursing note reviewed.  HENT:     Head:  Normocephalic.     Comments: Contusion to forehead noted    Right Ear: External ear normal.     Left Ear: External ear normal.     Ears:     Comments: No battle sign noted    Nose: Nose normal.     Mouth/Throat:     Mouth: Mucous membranes are moist.     Pharynx: Oropharynx is clear.  Eyes:     Extraocular Movements: Extraocular movements intact.     Pupils: Pupils are equal, round, and reactive to light.  Neck:     Comments: Cervical collar in place No obvious external signs of trauma Trachea midline No point tenderness over cervical spine Cardiovascular:     Rate and Rhythm: Normal rate. Rhythm irregular.     Pulses: Normal pulses.  Pulmonary:     Effort: Pulmonary effort is normal.     Breath sounds: Normal breath sounds.     Comments: Contusion noted to right chest wall Abdominal:     Palpations: Abdomen is soft.     Comments: No external signs of trauma no tenderness to palpation Abdomen is flat and soft  Musculoskeletal:     Comments: Skin tear to left elbow with no tenderness underneath and flocked range of motion right lower extremity with 7 cm laceration Left lower leg with skin tear Both hips and knees flexed and palpated with no point tenderness noted There are old contusions and skin changes on his bilateral upper arms but no obvious new injuries or deformities noted  Skin:    General: Skin is warm and dry.     Capillary Refill: Capillary refill takes less than 2 seconds.  Neurological:     Mental Status: He is alert. Mental status is at baseline.     Comments: Patient alert and oriented to person and place He is not moving any of his extremities on my request which does not appear to have any lateralized weakness or injuries    ED Results / Procedures / Treatments   Labs (all labs ordered are listed, but only abnormal results are displayed) Labs Reviewed  CBC - Abnormal; Notable for the following components:      Result Value   Hemoglobin 11.4 (*)    HCT  37.2 (*)    MCH 25.4 (*)    RDW 18.1 (*)    All other components within normal limits  COMPREHENSIVE METABOLIC PANEL - Abnormal; Notable for the following components:   Glucose, Bld 138 (*)    Total Protein 6.0 (*)    Albumin 2.8 (*)    All other components within normal limits    EKG None  Radiology DG Tibia/Fibula Left  Result Date: 01/20/2022 CLINICAL DATA:  Fall.  Skin tear of anterior left mid shin. EXAM: LEFT TIBIA AND FIBULA - 2 VIEW COMPARISON:  Left tibia and fibula radiographs 02/01/2021 FINDINGS: Moderate medial compartment of the knee and mild lateral compartment of the  knee joint space narrowing with mild peripheral degenerative osteophytes. Mild tibiotalar joint space narrowing. Mild distal medial greater than lateral distal degenerative osteophytes. Moderate patellofemoral joint space narrowing and peripheral osteophytosis. Mild chronic spurring at the quadriceps insertion on the patella. Small knee joint effusion, increased from prior. No acute fracture is seen. No dislocation. Vascular calcifications are noted. Resolution of the prior high-grade dorsal midfoot soft tissue swelling. IMPRESSION: 1. Mild-to-moderate degenerative changes at the knee and ankle. 2. No acute fracture. 3. Small knee joint effusion, increased from prior. Electronically Signed   By: Yvonne Kendall M.D.   On: 01/20/2022 09:28   CT Head Wo Contrast  Result Date: 01/20/2022 CLINICAL DATA:  Head trauma, minor (Age >= 65y); unwitnessed fall EXAM: CT HEAD WITHOUT CONTRAST TECHNIQUE: Contiguous axial images were obtained from the base of the skull through the vertex without intravenous contrast. RADIATION DOSE REDUCTION: This exam was performed according to the departmental dose-optimization program which includes automated exposure control, adjustment of the mA and/or kV according to patient size and/or use of iterative reconstruction technique. COMPARISON:  06/16/2021 FINDINGS: Brain: No acute intracranial  hemorrhage, mass effect, or edema. No acute appearing loss of gray-white differentiation. Chronic right frontal, left occipital, and right cerebellar infarcts. Inferior left frontal encephalomalacia. Additional patchy and confluent hypoattenuation in the supratentorial white matter probably reflects similar chronic microvascular ischemic changes. Prominence of the ventricles and sulci reflects similar parenchymal volume loss. No extra-axial collection. Vascular: There is atherosclerotic calcification at the skull base. Skull: Calvarium is unremarkable. Sinuses/Orbits: No acute finding. Other: None. IMPRESSION: No evidence of acute intracranial injury. Chronic infarcts and chronic microvascular ischemic changes. Parenchymal volume loss. Electronically Signed   By: Macy Mis M.D.   On: 01/20/2022 08:52   CT Cervical Spine Wo Contrast  Result Date: 01/20/2022 CLINICAL DATA:  Neck trauma (Age >= 65y) EXAM: CT CERVICAL SPINE WITHOUT CONTRAST TECHNIQUE: Multidetector CT imaging of the cervical spine was performed without intravenous contrast. Multiplanar CT image reconstructions were also generated. RADIATION DOSE REDUCTION: This exam was performed according to the departmental dose-optimization program which includes automated exposure control, adjustment of the mA and/or kV according to patient size and/or use of iterative reconstruction technique. COMPARISON:  October 2022 FINDINGS: Alignment: Stable. Skull base and vertebrae: Vertebral body heights are unchanged. No acute fracture. Degenerative endplate irregularity. Soft tissues and spinal canal: No prevertebral fluid or swelling. No visible canal hematoma. Disc levels: Multilevel degenerative changes are present including disc space narrowing, endplate osteophytes, and facet and uncovertebral hypertrophy. Appearance is similar to the prior study. No high-grade osseous encroachment on the spinal canal. Upper chest: No apical lung mass. Other: Mild calcified  plaque at the common carotid bifurcations. IMPRESSION: No acute cervical spine fracture. Electronically Signed   By: Macy Mis M.D.   On: 01/20/2022 08:56   DG Pelvis Portable  Result Date: 01/20/2022 CLINICAL DATA:  88 year old male with weakness. Unwitnessed fall from bed. EXAM: PORTABLE PELVIS 1-2 VIEWS COMPARISON:  CT Abdomen and Pelvis 12/05/2011. CT pelvis 04/19/2016 report (no images available). FINDINGS: Portable AP supine view at 0811 hours. Femoral heads are normally located. Hip joint spaces appear stable since 2013 and normal for age. No pelvis fracture identified. Bone mineralization within normal limits. Grossly intact proximal femurs. Lumbar spine disc and endplate degeneration. Negative visible lower abdominal and pelvic visceral contours. IMPRESSION: No acute fracture or dislocation identified about the pelvis. Electronically Signed   By: Genevie Ann M.D.   On: 01/20/2022 08:30   DG Chest  Port 1 View  Result Date: 01/20/2022 CLINICAL DATA:  88 year old male with weakness. Unwitnessed fall from bed. EXAM: PORTABLE CHEST 1 VIEW COMPARISON:  Portable chest 11/22/2021 and earlier. FINDINGS: Portable AP semi upright view at 0809 hours. Moderate chronic elevation of the right hemidiaphragm is stable along with lung volumes. Stable cardiac size and mediastinal contours. Visualized tracheal air column is within normal limits. Mild chronic lung base atelectasis or scarring appears stable. Elsewhere Allowing for portable technique the lungs are clear. Stable cholecystectomy clips. No acute osseous abnormality identified. IMPRESSION: No acute cardiopulmonary abnormality or acute traumatic injury identified. Electronically Signed   By: Genevie Ann M.D.   On: 01/20/2022 08:29   DG Foot Complete Right  Result Date: 01/20/2022 CLINICAL DATA:  Fall. Skin tear on medial aspect of right foot and medial malleolus. EXAM: RIGHT FOOT COMPLETE - 3+ VIEW COMPARISON:  Right foot radiographs 12/16/2019 FINDINGS: There  is diffuse decreased bone mineralization. Mild joint space narrowing of the interphalangeal joints diffusely. Mild peripheral great toe metatarsophalangeal joint degenerative spurring. Moderate third moderate second and mild first, fourth and fifth tarsometatarsal joint space narrowing. Mild dorsal talonavicular joint space narrowing and peripheral osteophytosis. Small plantar calcaneal heel spur. Minimal chronic enthesopathic change at the Achilles insertion on the calcaneus. No acute fracture is seen.  No dislocation. Mild vascular calcifications. Resolution of the prior distal dorsal forefoot soft tissue swelling. IMPRESSION: 1. Diffuse decreased bone mineralization. 2. Mild-to-moderate degenerative changes of the midfoot and forefoot. 3. No acute fracture is seen. Electronically Signed   By: Yvonne Kendall M.D.   On: 01/20/2022 09:26    Procedures Procedures    Medications Ordered in ED Medications  lidocaine-EPINEPHrine (XYLOCAINE W/EPI) 2 %-1:200000 (PF) injection 10 mL (10 mLs Infiltration Given by Other 01/20/22 1052)    ED Course/ Medical Decision Making/ A&P Clinical Course as of 01/20/22 1108  Fri Jan 20, 2022  0857 CBC reviewed and interpreted with normal white blood cell count and platelets Mild anemia with hemoglobin 11.4 and improving from first prior [DR]  0959 C-Met reviewed and interpreted sodium potassium chloride CO2 normal Glucose mildly elevated at 138 Total protein albumin are decreased consistent with the patient's chronic disease [DR]  1000 Left tib-fib x-Kelley Knoth reviewed interpreted no evidence of acute fracture and radiologist interpretation concurs [DR]  1000 Right foot x-Adelise Buswell reviewed and interpreted no evidence of foreign body or acute fracture and radiologist interpretation concurs [DR]  1000 CT cervical spine with no acute fracture per radiologist interpretation [DR]  1001 CT head reviewed with no evidence of acute intracranial abnormality or injury and radiologist  interpretation concurs [DR]  1001 Plain x-Kritika Stukes of pelvis reveals no evidence of acute fracture radiologist to rotation concurs [DR]  9371 Chest x-Bernis Schreur without evidence of acute abnormality and radiologist interpretation concurs [DR]    Clinical Course User Index [DR] Pattricia Boss, MD                           Medical Decision Making 88 year old male who has been in ambulatory since stroke and has been in a care facility.  [place] out of bed today.  He presents with multiple contusions and abrasions.  By history this appears to have been a mechanical fall.  Patient had labs and imaging obtained.  There are no acute abnormalities noted on labs with stable anemia.  Imaging reveals no evidence of acute fracture or intracranial abnormality. Wound on his right foot was repaired with sutures  other wounds that appear to be skin tears and what will have dressings placed Wife is at bedside and we discussed the findings with her. Patient appears stable for discharge back to his facility  Amount and/or Complexity of Data Reviewed External Data Reviewed:     Details: Reviewed palliative care notes Reviewed last discharge summary Labs: ordered. Radiology: ordered.  Risk Prescription drug management.           Final Clinical Impression(s) / ED Diagnoses Final diagnoses:  Fall, initial encounter  Laceration of right foot, initial encounter  Skin tear of left elbow without complication, initial encounter  Skin tear of left lower leg without complication, initial encounter  Contusion of scalp, initial encounter    Rx / DC Orders ED Discharge Orders     None         Pattricia Boss, MD 01/20/22 1108

## 2022-01-20 NOTE — Discharge Instructions (Signed)
Please observe wounds for any signs of infection Sutures can come out in 7 to 10 days No evidence of acute abnormality was noted on head CT.  However, patient appears to have worsening symptoms, he should be reevaluated

## 2022-01-23 DIAGNOSIS — I10 Essential (primary) hypertension: Secondary | ICD-10-CM | POA: Diagnosis not present

## 2022-01-23 DIAGNOSIS — K219 Gastro-esophageal reflux disease without esophagitis: Secondary | ICD-10-CM | POA: Diagnosis not present

## 2022-01-23 DIAGNOSIS — W19XXXD Unspecified fall, subsequent encounter: Secondary | ICD-10-CM | POA: Diagnosis not present

## 2022-01-23 DIAGNOSIS — I4891 Unspecified atrial fibrillation: Secondary | ICD-10-CM | POA: Diagnosis not present

## 2022-01-24 ENCOUNTER — Non-Acute Institutional Stay: Payer: Medicaid Other | Admitting: Family Medicine

## 2022-01-24 ENCOUNTER — Encounter: Payer: Self-pay | Admitting: Family Medicine

## 2022-01-24 ENCOUNTER — Telehealth: Payer: Self-pay | Admitting: Family Medicine

## 2022-01-24 VITALS — BP 102/68 | HR 71 | Resp 20

## 2022-01-24 DIAGNOSIS — E538 Deficiency of other specified B group vitamins: Secondary | ICD-10-CM

## 2022-01-24 DIAGNOSIS — E440 Moderate protein-calorie malnutrition: Secondary | ICD-10-CM

## 2022-01-24 DIAGNOSIS — I959 Hypotension, unspecified: Secondary | ICD-10-CM

## 2022-01-24 NOTE — Progress Notes (Unsigned)
Therapist, nutritional Palliative Care Consult Note Telephone: [phone]  Fax: [phone]    Date of encounter: 01/24/22 12:06 PM PATIENT NAME: Bernard Ramirez [address] [place] [address]   [phone] (home)  DOB: May 03, 1931 MRN: [id number] PRIMARY CARE PROVIDER:    Donita Brooks, MD,  [address] [place] [address] [phone]  REFERRING PROVIDER:   Donita Brooks, MD [address] [place],  [place] [address] [phone]  RESPONSIBLE PARTY:    Contact Information     Name Relation Home Work Mobile   [place], [place] [phone]  [phone]        I met face to face with patient in the skilled nursing facility. Palliative Care was asked to follow this patient by consultation request of  Donita Brooks, MD to address advance care planning and complex medical decision making. This is a follow up visit.   ASSESSMENT, SYMPTOM MANAGEMENT AND PLAN / RECOMMENDATIONS:   Hypotension Does not currently need to resume antihypertensives as BP normal to low. Encourage fluid intake   Protein Calorie Malnutrition Currently receiving Medpass BID. Albumin at nadir of 2.8 from 4.0 on 11/22/21 Continue to encourage protein intake, give meds with medpass and/or food to increase caloric intake   Vitamin B12 deficiency Supplement with Cyanocobalamin 1000 mcg IM monthly and folic acid 1 mg po daily.   Advance Care Planning/Goals of Care: Goals include to maximize quality of life and symptom management.  Identification of a healthcare agent-wife Fontaine No is [facility] POA Review of an advance directive document-MOST . Decision not to resuscitate or to de-escalate disease focused treatments due to poor prognosis. CODE STATUS: MOST AS OF 02/03/21: DNR/DNI with comfort measures Use of antibiotics and IV fluids on a case by case, time limited basis Feeding tube not addressed     Follow up Palliative Care Visit:  Palliative care will continue to follow for complex medical decision making, advance care planning, and clarification of goals. Return 4 weeks or prn.    This visit was coded based on medical decision making (MDM).  PPS: 30%  HOSPICE ELIGIBILITY/DIAGNOSIS: TBD  Chief Complaint:   Palliative Care is following up an acute visit to check on BP, weight and recent labs after pt noted to be hypotensive on prior visit and subsequent d/c of antihypertensives, rate controlling meds and diuretics.    HISTORY OF PRESENT ILLNESS:  Bernard Ramirez is a 88 y.o. year old male with recent CVA with residual deficits on antiplatelet therapy, HTN, persistent atrial fibrillation, CKD, BPH, anemia, back pain, vertigo, protein calorie malnutrition and anxiety. Pt was seen by Palliative NP last week with profound hypotension SBP < 90 on multiple antihypertensives and diuretics.  He was noted to have inadequate fluid intake and wife, Fontaine No was interested in pursuing administration of IV fluids if necessary.  Cozaar and lasix BID were all d/c'd.  Labs were ordered.  On follow up last Thursday 48 hours after changes, BP had mostly improved to SBP 100-130 and at that time lab results were not available (CBC, CMP and vitamin B12 were ordered).   On review of chart today, pt was sent to the ED after an unwitnessed fall on 01/20/22 where he hit his head when he told staff he was trying to pick something up off the floor, lost his balance and fell.  He had sutures placed in skin tear to top of right foot.   BP in  ED elevated 130s/109.   BP taken daily at facility over the last week 88-127/57-77, most SBP > 90.  He denies CP, SOB, PND, difficulty with choking or coughing after eating.  He is confused because he states he has been calling for his wife to help him as he can't get up without clothes and can't walk by himself "but she is not coming".   Advised pt that wife is not at facility at present.  Per Suanne Marker, pt's facility  nurse Vitamin B12 only comes in injectable form so will change orders to 1000 mcg IM monthly. He has been unable to participate with PT due to being very weak and shaky when up.  Had hoped with improved BP he may be able to participate but he is unable at present to participate.   His labs 01/18/22 at the facility:   CBC with low RBC 4.45, HGB 11.1, HCT 35.3%, MCV 79.3, normal WBC 10,5 with elevated neut 7.9.   Vitamin B12 low normal at 638, folic acid low normal at 6.9.    CMP with elevated glucose 111, alk phos 140 and low Albumin 3.3, osmolality 275.5, Normal Cr 1.02, BUN 15.4 and eGFR 70.  LABS 01/20/22 at ED: CBC with mildly low HGB 11.4, HCT 37.2% MCV normal at 82.9.    CMP with low albumin 2.8 and normal Cr 0.88, BUN 9 and eGFR 58.   Imaging studies 01/20/22 at ED: Xray of left tib fib shows degenerative arthritis and small knee effusion.    Right foot xray with degenerative changes of forefoot and midfoot.    CT cervical spine with degenerative disc disease, no acute fracture.   CT head with chronic infarct, encephalomalacia.  History obtained from review of EMR, discussion with facility staff and/or Mr. Jares.  Called wife and left VM for call back to follow up.  I reviewed available labs, medications, imaging, studies and related documents from the EMR.  Records reviewed and summarized above.   ROS General: NAD EYES: denies vision changes ENMT: denies dysphagia Cardiovascular: denies chest pain, denies DOE Pulmonary: denies cough, denies increased SOB Abdomen: endorses good appetite, denies constipation, endorses continence of bowel GU: denies dysuria, endorses continence of urine MSK:  endorses increased weakness, single fall 01/20/22 with ED visit Skin: denies rashes or wounds Neurological: denies pain, denies insomnia Psych: Endorses positive mood Heme/lymph/immuno: denies abnormal bleeding  Physical Exam: Current and past weights: He has lost 10 lbs in the month of  May from 222 lbs 5/2 to 212 lbs as of 01/13/22.   Constitutional: NAD General: frail appearing, thin  EYES: anicteric sclera, lids intact, no discharge  ENMT: hard of hearing, oral mucous membranes moist, dentition intact CV: S1S2, IRIR, no LE edema Pulmonary: CTAB, no increased work of breathing, no cough, room air Abdomen: normo-active BS + 4 quadrants, soft and non tender, no ascites GU: deferred MSK:   moves all extremities, minimally ambulatory Skin: warm and dry,.  skin tear left elbow covered with dressing with small amount of yellow slough in wound bed, right foot covered with dressing, dressing to left anterior shin CDI, Neuro:  noted generalized weakness,  noted pleasantly confused Psych: non-anxious affect, A and O to self Hem/lymph/immuno:  scattered ecchymosis of varying stages of healing   Thank you for the opportunity to participate in the care of Mr. Lea.  The palliative care team will continue to follow. Please call our office at [phone] if we can be of additional assistance.   Santiago Glad  A Levii Hairfield, FNP -C  COVID-19 PATIENT SCREENING TOOL Asked and negative response unless otherwise noted:   Have you had symptoms of covid, tested positive or been in contact with someone with symptoms/positive test in the past 5-10 days?  No

## 2022-01-25 ENCOUNTER — Encounter: Payer: Self-pay | Admitting: Family Medicine

## 2022-01-25 DIAGNOSIS — I10 Essential (primary) hypertension: Secondary | ICD-10-CM | POA: Diagnosis not present

## 2022-01-25 DIAGNOSIS — E785 Hyperlipidemia, unspecified: Secondary | ICD-10-CM | POA: Diagnosis not present

## 2022-01-25 DIAGNOSIS — G9340 Encephalopathy, unspecified: Secondary | ICD-10-CM | POA: Diagnosis not present

## 2022-01-25 DIAGNOSIS — I639 Cerebral infarction, unspecified: Secondary | ICD-10-CM | POA: Diagnosis not present

## 2022-01-31 ENCOUNTER — Ambulatory Visit (INDEPENDENT_AMBULATORY_CARE_PROVIDER_SITE_OTHER): Payer: PPO | Admitting: Neurology

## 2022-01-31 ENCOUNTER — Encounter: Payer: Self-pay | Admitting: Neurology

## 2022-01-31 VITALS — BP 123/79 | HR 67

## 2022-01-31 DIAGNOSIS — I639 Cerebral infarction, unspecified: Secondary | ICD-10-CM | POA: Diagnosis not present

## 2022-01-31 DIAGNOSIS — F01C0 Vascular dementia, severe, without behavioral disturbance, psychotic disturbance, mood disturbance, and anxiety: Secondary | ICD-10-CM | POA: Diagnosis not present

## 2022-01-31 NOTE — Progress Notes (Signed)
[facility] [address] [place] [address] [phone]       OFFICE FOLLOW-UP NOTE  Mr. JOB HOLTSCLAW Date of Birth:  March 11, 1931 Medical Record Number:  [id number]   HPI: Mr. Mcclintock is a 88 year old Caucasian male seen today for office follow-up visit following recent hospital admission for stroke.  Is accompanied by his wife.  History is obtained from them and review of electronic medical records and I have personally reviewed pertinent available imaging films in PACS.  He has past medical history of hypertension, persistent atrial fibrillation, chronic kidney disease, benign prostatic hypertrophy, anemia, back pain, protein calorie mall nutrition, anxiety who presented on 11/23/2021 with a few day history of generalized weakness dizziness and vertigo.  Upon arrival at the emergency room at [facility] he was hypothermic, confused and having generalized weakness.  MRI was obtained which showed scattered acute small infarcts in the right inferior cerebellum.  Old infarcts were noted in both occipital lobes and left inferior frontal lobes.  MR angiogram did not show any flow in the right PICA.  Carotid ultrasound showed no significant extracranial stenosis.  CT head was unremarkable.  Echocardiogram showed ejection fraction of 50 to 55% with mild global hypokinesis.  LDL cholesterol 52 mg percent.  Hemoglobin A1c was 6.3.  Patient's infarcts were felt secondary to atrial fibrillation.  Patient was on Plavix prior to admission and palliative care was consulted and patient was DNR and hence was not felt to be a candidate for aggressive therapy and anticoagulation was not recommended.  Patient returns from the nursing home today.  He is currently [facility] at [facility].  He is accompanied by his wife.  She feels patient is not doing well.  He is still weak all over.  Needs to be fed.  Wife is not happy with the food that is being given at the nursing home and she has to bring  food from home and feed him personally.  Patient did try to participate in physical therapy for a while but did not make any progress and was discontinued.  He would start trembling and shaking his legs even when he was stood up with support.  He is never been able to walk since her stroke.  He is currently spending most of his time in a wheelchair.  Continues to have poor memory and has dementia.  His disoriented to time and place but does recognize family members.  The patient is DNR and is followed by palliative care team. ROS:   14 system review of systems is positive for weakness, leg tremors, unsteadiness, inability to walk, memory loss all other systems negative  PMH:  Past Medical History:  Diagnosis Date   Anemia    04/2012: H&H-10.2/31.5, MCV-77; 06/2012:12/38.9   Arthritis    Benign prostatic hypertrophy    s/p transurethral resection of the prostate   Chest pain    2004; with dyspnea, stress nuclear-normal EF + questionable inferior wall ischemia, normal coronary angiography;  2010-negative stress nuclear   CVA (cerebral infarction)    asymptomatic (seen on CT)   Diarrhea    h/o Hemoccult-positive stool   Dysrhythmia    Edema of both legs    GERD (gastroesophageal reflux disease)    History of blood transfusion    History of kidney stones    History of urinary tract infection    Hypertension    Jerking movements of extremities    left arm    Low back pain  Obstructive sleep apnea    pt states can not use the CPAP   Peripheral neuropathy    lower legs and feet bilat    Persistent atrial fibrillation ([place]) 2013   Asymptomatic; diagnosed in 05/2012   Pneumonia    Prostate cancer ([place]) 2017   stage 4   Shortness of breath dyspnea    pt states only develops SOB if tries to do something too quickly   Upper GI bleed 1985   1985-peptic ulcer disease; initial hemigastrectomy; subsequent Billroth II   Urinary hesitancy    Vertigo    ED evaluation-06/2012    Social  History:  Social History   Socioeconomic History   Marital status: Married    Spouse name: [name]   Number of children: 5   Years of education: Not on file   Highest education level: Not on file  Occupational History   Occupation: Retired Pensions consultant: RETIRED  Tobacco Use   Smoking status: Former    Types: Cigars    Quit date: 08/22/2003    Years since quitting: 18.4   Smokeless tobacco: Never   Tobacco comments:    Quit many years ago; few cigars per day; didn't inhale  Vaping Use   Vaping Use: Never used  Substance and Sexual Activity   Alcohol use: No    Alcohol/week: 1.0 standard drink of alcohol    Types: 1 Standard drinks or equivalent per week   Drug use: No   Sexual activity: Not on file  Other Topics Concern   Not on file  Social History Narrative   Lives w/ wife in [place]   Retired truck Geophysicist/field seismologist   Social Determinants of Health   Financial Resource Strain: [place]  (05/05/2021)   Overall Financial Resource Strain (CARDIA)    Difficulty of Paying Living Expenses: Not hard at all  Food Insecurity: No Food Insecurity (05/05/2021)   Hunger Vital Sign    Worried About Running Out of Food in the Last Year: Never true    Ran Out of Food in the Last Year: Never true  Transportation Needs: No Transportation Needs (05/05/2021)   PRAPARE - Hydrologist (Medical): No    Lack of Transportation (Non-Medical): No  Physical Activity: Inactive (05/05/2021)   Exercise Vital Sign    Days of Exercise per Week: 0 days    Minutes of Exercise per Session: 0 min  Stress: No Stress Concern Present (05/05/2021)   [place]    Feeling of Stress : Not at all  Social Connections: Socially Isolated (05/05/2021)   Social Connection and Isolation Panel [NHANES]    Frequency of Communication with Friends and Family: Never    Frequency of Social Gatherings with Friends and Family:  Once a week    Attends Religious Services: Never    Marine scientist or Organizations: No    Attends Archivist Meetings: Never    Marital Status: Married  Human resources officer Violence: Not At Risk (05/05/2021)   Humiliation, Afraid, Rape, and Kick questionnaire    Fear of Current or Ex-Partner: No    Emotionally Abused: No    Physically Abused: No    Sexually Abused: No    Medications:   Current Outpatient Medications on File Prior to Visit  Medication Sig Dispense Refill   clobetasol (TEMOVATE) 0.05 % external solution Apply 1 application topically 2 (two) times daily. 50 mL 2  clopidogrel (PLAVIX) 75 MG tablet TAKE 1 TABLET(75 MG) BY MOUTH DAILY 30 tablet 0   Cyanocobalamin (B-12 COMPLIANCE INJECTION) 1000 MCG/ML KIT Inject 1,000 mcg as directed every 30 (thirty) days.     Diclofenac Sodium 1.5 % SOLN Apply topically as needed for pain.     folic acid (FOLVITE) 1 MG tablet Take 1 mg by mouth daily.     Infant Care Products [facility]) OINT Apply 1 application. topically 2 (two) times daily.     meclizine (ANTIVERT) 25 MG tablet Take one tablet by mouth at bedtime. 30 tablet 3   NON FORMULARY Diet:Regular, NAS     NYSTATIN powder APPLY TOPICALLY TO THE AFFECTED AREA EVERY DAY AS NEEDED FOR RASH 60 g 3   pantoprazole (PROTONIX) 40 MG tablet TAKE 1 TABLET(40 MG) BY MOUTH DAILY 90 tablet 3   Polyethyl Glycol-Propyl Glycol (SYSTANE) 0.4-0.3 % SOLN Apply 1-2 drops to eye 4 (four) times daily as needed.     polyethylene glycol (MIRALAX / GLYCOLAX) 17 g packet Take 17 g by mouth daily as needed for moderate constipation or severe constipation.  0   potassium chloride SA (KLOR-CON) 20 MEQ tablet TAKE 1 TABLET(20 MEQ) BY MOUTH DAILY 30 tablet 0   pravastatin (PRAVACHOL) 20 MG tablet TAKE 1 TABLET(20 MG) BY MOUTH DAILY 90 tablet 1   senna-docusate (SENOKOT-S) 8.6-50 MG tablet Take 1 tablet by mouth at bedtime.     triamcinolone cream (KENALOG) 0.1 % Apply 1 application topically  daily as needed (for irritation). 30 g 0   tuberculin (TUBERSOL) 5 UNIT/0.1ML injection Inject 5 Units into the skin once. Every 14 days     No current facility-administered medications on file prior to visit.    Allergies:   Allergies  Allergen Reactions   Contrast Media [Iodinated Contrast Media]     Pt states he was given "dye" 20 yrs ago, anaphylaxis & syncope - was told to never have it again    Gabapentin Itching   Aspirin Other (See Comments)    Blistering    Latex Rash   Lipitor [Atorvastatin] Itching   Tape Rash    EKG leads    Physical Exam General: well developed, well nourished, seated, in no evident distress Head: head normocephalic and atraumatic.  Neck: supple with no carotid or supraclavicular bruits Cardiovascular: regular rate and rhythm, no murmurs Musculoskeletal: no deformity Skin:  no rash/petichiae Vascular:  Normal pulses all extremities Vitals:   01/31/22 1039  BP: 123/79  Pulse: 67   Neurologic Exam Mental Status: Awake and fully alert. Oriented to place and time. Recent and remote memory intact. Attention span, concentration and fund of knowledge appropriate. Mood and affect appropriate.  Cranial Nerves: Fundoscopic exam reveals sharp disc margins. Pupils equal, briskly reactive to light. Extraocular movements full without nystagmus. Visual fields full to confrontation. Hearing intact. Facial sensation intact. Face, tongue, palate moves normally and symmetrically.  Motor: Symmetric antigravity movement in the upper extremities 3/5 and only 2/5 proximally in the lower extremities and 3/5 distally.   Sensory.: intact to touch ,pinprick .position and vibratory sensation.  Coordination: Rapid alternating movements normal in all extremities. Finger-to-nose and heel-to-shin performed accurately bilaterally. Gait and Station: Unable to get up from a chair and walk due to generalized weakness  reflexes: 1+ and symmetric. Toes downgoing.   NIHSS   7 Modified Rankin  4    01/31/2022   10:55 AM  Montreal Cognitive Assessment   Visuospatial/ Executive (0/5) 0  Naming (0/3) 0  Attention: Read list of digits (0/2) 2  Attention: Read list of letters (0/1) 0  Attention: Serial 7 subtraction starting at 100 (0/3) 0  Language: Repeat phrase (0/2) 1  Language : Fluency (0/1) 0  Abstraction (0/2) 0  Delayed Recall (0/5) 0  Orientation (0/6) 2  Total 5  Adjusted Score (based on education) 6     ASSESSMENT:     PLAN: I had a long discussion with the patient and his wife regarding the recent stroke and dementia and onset questions.  Patient unfortunately did not doing well and unable to progress with therapies due to general debility and dementia.  Recommend he continue Plavix for stroke prevention and maintain aggressive risk factor modification with strict control hypertension blood pressure goal below 140/90, lipids with LDL cholesterol goal below 70 mg percent and diabetes with hemoglobin A1c goal below 6.5%.  Continue physical Occupational Therapy if feasible.  Patient is DNR consider hospice if family is agreeable.  No routine schedule appointment with me is necessary.Greater than 50% of time during this 35 minute visit was spent on counseling,explanation of diagnosis, planning of further management, discussion with patient and family and coordination of care Antony Contras, MD Note: This document was prepared with digital dictation and possible smart phrase technology. Any transcriptional errors that result from this process are unintentional

## 2022-01-31 NOTE — Patient Instructions (Signed)
I had a long discussion with the patient and his wife regarding the recent stroke and dementia and onset questions.  Patient unfortunately did not doing well and unable to progress with therapies due to general debility and dementia.  Recommend he continue Plavix for stroke prevention and maintain aggressive risk factor modification with strict control hypertension blood pressure goal below 140/90, lipids with LDL cholesterol goal below 70 mg percent and diabetes with hemoglobin A1c goal below 6.5%.  Continue physical Occupational Therapy if feasible.  Patient is DNR consider hospice if family is agreeable.  No routine schedule appointment with me is necessary.

## 2022-02-02 ENCOUNTER — Telehealth: Payer: Self-pay | Admitting: Pharmacist

## 2022-02-02 DIAGNOSIS — R001 Bradycardia, unspecified: Secondary | ICD-10-CM | POA: Diagnosis not present

## 2022-02-02 DIAGNOSIS — I639 Cerebral infarction, unspecified: Secondary | ICD-10-CM | POA: Diagnosis not present

## 2022-02-02 DIAGNOSIS — K219 Gastro-esophageal reflux disease without esophagitis: Secondary | ICD-10-CM | POA: Diagnosis not present

## 2022-02-02 DIAGNOSIS — M6281 Muscle weakness (generalized): Secondary | ICD-10-CM | POA: Diagnosis not present

## 2022-02-02 DIAGNOSIS — R680 Hypothermia, not associated with low environmental temperature: Secondary | ICD-10-CM | POA: Diagnosis not present

## 2022-02-02 DIAGNOSIS — N179 Acute kidney failure, unspecified: Secondary | ICD-10-CM | POA: Diagnosis not present

## 2022-02-02 DIAGNOSIS — Z8673 Personal history of transient ischemic attack (TIA), and cerebral infarction without residual deficits: Secondary | ICD-10-CM | POA: Diagnosis not present

## 2022-02-02 DIAGNOSIS — N401 Enlarged prostate with lower urinary tract symptoms: Secondary | ICD-10-CM | POA: Diagnosis not present

## 2022-02-02 DIAGNOSIS — R278 Other lack of coordination: Secondary | ICD-10-CM | POA: Diagnosis not present

## 2022-02-02 DIAGNOSIS — Z741 Need for assistance with personal care: Secondary | ICD-10-CM | POA: Diagnosis not present

## 2022-02-02 DIAGNOSIS — R2681 Unsteadiness on feet: Secondary | ICD-10-CM | POA: Diagnosis not present

## 2022-02-02 DIAGNOSIS — I480 Paroxysmal atrial fibrillation: Secondary | ICD-10-CM | POA: Diagnosis not present

## 2022-02-02 DIAGNOSIS — Z8546 Personal history of malignant neoplasm of prostate: Secondary | ICD-10-CM | POA: Diagnosis not present

## 2022-02-02 DIAGNOSIS — G9341 Metabolic encephalopathy: Secondary | ICD-10-CM | POA: Diagnosis not present

## 2022-02-02 DIAGNOSIS — E785 Hyperlipidemia, unspecified: Secondary | ICD-10-CM | POA: Diagnosis not present

## 2022-02-02 DIAGNOSIS — I10 Essential (primary) hypertension: Secondary | ICD-10-CM | POA: Diagnosis not present

## 2022-02-02 DIAGNOSIS — R293 Abnormal posture: Secondary | ICD-10-CM | POA: Diagnosis not present

## 2022-02-02 NOTE — Progress Notes (Signed)
    Chronic Care Management Pharmacy Assistant   Name: KOLSEN CHOE  MRN: [id number] DOB: February 09, 1931   Reason for Encounter: Unenrolling from CCM    As of 01/24/22 upon chart review patient is under palliative care. Will be unenrolling from CCM.   Jobe Gibbon, [facility] Clinical Pharmacist Assistant  [phone]

## 2022-02-07 DIAGNOSIS — I4891 Unspecified atrial fibrillation: Secondary | ICD-10-CM | POA: Diagnosis not present

## 2022-02-07 DIAGNOSIS — I10 Essential (primary) hypertension: Secondary | ICD-10-CM | POA: Diagnosis not present

## 2022-02-07 DIAGNOSIS — K219 Gastro-esophageal reflux disease without esophagitis: Secondary | ICD-10-CM | POA: Diagnosis not present

## 2022-02-17 ENCOUNTER — Non-Acute Institutional Stay: Payer: Medicaid Other | Admitting: Family Medicine

## 2022-02-17 VITALS — BP 128/80 | HR 78 | Resp 16 | Wt 197.0 lb

## 2022-02-17 DIAGNOSIS — I959 Hypotension, unspecified: Secondary | ICD-10-CM

## 2022-02-17 DIAGNOSIS — I10 Essential (primary) hypertension: Secondary | ICD-10-CM | POA: Diagnosis not present

## 2022-02-17 DIAGNOSIS — Z7189 Other specified counseling: Secondary | ICD-10-CM

## 2022-02-17 DIAGNOSIS — K219 Gastro-esophageal reflux disease without esophagitis: Secondary | ICD-10-CM | POA: Diagnosis not present

## 2022-02-17 DIAGNOSIS — I4891 Unspecified atrial fibrillation: Secondary | ICD-10-CM | POA: Diagnosis not present

## 2022-02-17 DIAGNOSIS — F01C0 Vascular dementia, severe, without behavioral disturbance, psychotic disturbance, mood disturbance, and anxiety: Secondary | ICD-10-CM

## 2022-02-17 DIAGNOSIS — E440 Moderate protein-calorie malnutrition: Secondary | ICD-10-CM

## 2022-02-17 DIAGNOSIS — R609 Edema, unspecified: Secondary | ICD-10-CM | POA: Diagnosis not present

## 2022-02-17 NOTE — Progress Notes (Signed)
Therapist, nutritional Palliative Care Consult Note Telephone: [phone]  Fax: [phone]    Date of encounter: 02/17/22 4:10 PM PATIENT NAME: Bernard Ramirez [address] [place] [address]   [phone] (home)  DOB: 1931-04-03 MRN: [id number] PRIMARY CARE PROVIDER:    Donita Brooks, MD,  [address] [place] [address] [phone]  REFERRING PROVIDER:   Donita Brooks, MD [address] [place],  [place] [address] [phone]  RESPONSIBLE PARTY:    Contact Information     Name Relation Home Work Mobile   Hyde, Percle [phone]  [phone]        I met face to face with patient in the skilled nursing facility. Palliative Care was asked to follow this patient by consultation request of  Donita Brooks, MD to address advance care planning and complex medical decision making. This is a follow up visit.   ASSESSMENT, SYMPTOM MANAGEMENT AND PLAN / RECOMMENDATIONS:   Dementia, likely vascular Bedbound with severe failure to thrive-Roughly 17% weight loss since January Significant decline, Neurology recommending Hospice if family desires.   Protein Calorie Malnutrition Currently receiving Medpass BID. Albumin at nadir of 2.8 from 4.0 on 11/22/21 Spouse continues to bring in favorite foods to feed patient. Weight loss of 11-12% over the last month  3. Counseling on goals of care Wife was supposed to be present at time of visit, will follow up by phone. Pt is not on skilled days, has LTC Medicaid to cover facility.    CODE STATUS: MOST AS OF 02/03/21: DNR/DNI with comfort measures Use of antibiotics and IV fluids on a case by case, time limited basis Feeding tube not addressed     Follow up Palliative Care Visit: Palliative care will continue to follow for complex medical decision making, advance care planning, and clarification of goals. Return 4 weeks or prn.    This visit was coded based  on medical decision making (MDM).  PPS: 30%  HOSPICE ELIGIBILITY/DIAGNOSIS: TBD  Chief Complaint:   Palliative care is following for chronic management in setting of history of stroke with chronic congestive heart failure.  Wife has requested visit to discuss possible transition to hospice services.    HISTORY OF PRESENT ILLNESS:  Bernard Ramirez is a 88 y.o. year old male with recent CVA with residual deficits on antiplatelet therapy, HTN, persistent atrial fibrillation, CKD, BPH, anemia, back pain, vertigo, protein calorie malnutrition and anxiety.  He denies CP, SOB, PND.  States he doesn't know where wife is at present. He states he has been nowhere but in bed, denies pain, nausea.  States he is having 1-2 loose stools per day.  States he is eating well.  Wife brings lunch and dinner, usually staying to feed him both meals.   Pt was seen by Neurologist Dr Pearlean Brownie from [facility] on 01/31/22.  Pt was unable to successfully rehab as he would start with trembling and shaky legs when trying to work with PT.  Spends majority of his time in bed or WC and must have help to transfer.  Poor memory and dementia per Neurologist and has been unable to walk or feed himself since stroke. MoCA done in Neuro office score 6      LABS 01/20/22 at ED: CBC with mildly low HGB 11.4, HCT 37.2% MCV normal at 82.9.    CMP with low albumin 2.8 and normal Cr 0.88, BUN 9 and eGFR 58.  Imaging studies 01/20/22 at ED: Xray of left tib fib shows degenerative arthritis and small knee effusion.    Right foot xray with degenerative changes of forefoot and midfoot.    CT cervical spine with degenerative disc disease, no acute fracture.   CT head with chronic infarct, encephalomalacia.  History obtained from review of EMR, discussion with facility staff and/or Mr. Even.    I reviewed available labs, medications, imaging, studies and related documents from the EMR.  Records reviewed and  summarized above.   ROS General: NAD Cardiovascular: denies chest pain, denies DOE Pulmonary: denies cough, denies increased SOB Abdomen: endorses good appetite, denies constipation, endorses incontinence of bowel GU: denies dysuria, endorses incontinence of urine MSK:  bedbound, no falls Skin: Wound to left lower ant shin covered with dressing Neurological: denies pain, denies insomnia  Physical Exam: Current and past weights: 237 lbs 09/15/21,  232 lbs on 11/07/21, 222 lbs 5/2 to 197 lbs as of 02/14/22.   Constitutional: NAD General: frail appearing, thin  CV: S1S2, IRIR, no LE edema, BLE cool Pulmonary: CTAB, no increased work of breathing, no cough, room air Abdomen: normo-active BS + 4 quadrants, soft and non tender MSK: moves all extremities, able to assist with transfers  Skin: warm and dry, dressing to left anterior shin CDI, Neuro:  noted generalized weakness,   Psych: non-anxious/flat affect, A and O to self   Thank you for the opportunity to participate in the care of Bernard Ramirez.  The palliative care team will continue to follow. Please call our office at [phone] if we can be of additional assistance.   Lurline Idol, FNP -C  COVID-19 PATIENT SCREENING TOOL Asked and negative response unless otherwise noted:   Have you had symptoms of covid, tested positive or been in contact with someone with symptoms/positive test in the past 5-10 days?  No

## 2022-02-18 ENCOUNTER — Encounter: Payer: Self-pay | Admitting: Family Medicine

## 2022-02-18 DIAGNOSIS — F01C0 Vascular dementia, severe, without behavioral disturbance, psychotic disturbance, mood disturbance, and anxiety: Secondary | ICD-10-CM | POA: Insufficient documentation

## 2022-02-18 DIAGNOSIS — Z7189 Other specified counseling: Secondary | ICD-10-CM | POA: Insufficient documentation

## 2022-02-20 DIAGNOSIS — I639 Cerebral infarction, unspecified: Secondary | ICD-10-CM | POA: Diagnosis not present

## 2022-02-20 DIAGNOSIS — N401 Enlarged prostate with lower urinary tract symptoms: Secondary | ICD-10-CM | POA: Diagnosis not present

## 2022-02-20 DIAGNOSIS — R293 Abnormal posture: Secondary | ICD-10-CM | POA: Diagnosis not present

## 2022-02-20 DIAGNOSIS — R278 Other lack of coordination: Secondary | ICD-10-CM | POA: Diagnosis not present

## 2022-02-20 DIAGNOSIS — R680 Hypothermia, not associated with low environmental temperature: Secondary | ICD-10-CM | POA: Diagnosis not present

## 2022-02-20 DIAGNOSIS — E785 Hyperlipidemia, unspecified: Secondary | ICD-10-CM | POA: Diagnosis not present

## 2022-02-20 DIAGNOSIS — R001 Bradycardia, unspecified: Secondary | ICD-10-CM | POA: Diagnosis not present

## 2022-02-20 DIAGNOSIS — Z8546 Personal history of malignant neoplasm of prostate: Secondary | ICD-10-CM | POA: Diagnosis not present

## 2022-02-20 DIAGNOSIS — Z741 Need for assistance with personal care: Secondary | ICD-10-CM | POA: Diagnosis not present

## 2022-02-20 DIAGNOSIS — N179 Acute kidney failure, unspecified: Secondary | ICD-10-CM | POA: Diagnosis not present

## 2022-02-20 DIAGNOSIS — K219 Gastro-esophageal reflux disease without esophagitis: Secondary | ICD-10-CM | POA: Diagnosis not present

## 2022-02-20 DIAGNOSIS — Z8673 Personal history of transient ischemic attack (TIA), and cerebral infarction without residual deficits: Secondary | ICD-10-CM | POA: Diagnosis not present

## 2022-02-20 DIAGNOSIS — M6281 Muscle weakness (generalized): Secondary | ICD-10-CM | POA: Diagnosis not present

## 2022-02-20 DIAGNOSIS — I480 Paroxysmal atrial fibrillation: Secondary | ICD-10-CM | POA: Diagnosis not present

## 2022-02-20 DIAGNOSIS — R2681 Unsteadiness on feet: Secondary | ICD-10-CM | POA: Diagnosis not present

## 2022-02-20 DIAGNOSIS — G9341 Metabolic encephalopathy: Secondary | ICD-10-CM | POA: Diagnosis not present

## 2022-02-20 DIAGNOSIS — I10 Essential (primary) hypertension: Secondary | ICD-10-CM | POA: Diagnosis not present

## 2022-02-28 DIAGNOSIS — R059 Cough, unspecified: Secondary | ICD-10-CM | POA: Diagnosis not present

## 2022-02-28 DIAGNOSIS — K219 Gastro-esophageal reflux disease without esophagitis: Secondary | ICD-10-CM | POA: Diagnosis not present

## 2022-02-28 DIAGNOSIS — I4891 Unspecified atrial fibrillation: Secondary | ICD-10-CM | POA: Diagnosis not present

## 2022-02-28 DIAGNOSIS — I10 Essential (primary) hypertension: Secondary | ICD-10-CM | POA: Diagnosis not present

## 2022-03-03 ENCOUNTER — Telehealth: Payer: Self-pay

## 2022-03-03 ENCOUNTER — Other Ambulatory Visit: Payer: Self-pay | Admitting: Family Medicine

## 2022-03-03 NOTE — Telephone Encounter (Signed)
Pt's spouse came in stating that pt has been d/c'd from rehab facility and is wanting to know if pcp will do a home visit for pt. Pt is not able to come to office. Pt's wife would like a call back if this is possible.   Cb#: [phone]

## 2022-03-03 NOTE — Telephone Encounter (Signed)
Pt does not have this med on his current list?

## 2022-03-03 NOTE — Telephone Encounter (Signed)
Pt would also like a refill of this med HYDROcodone-acetaminophen (NORCO) 10-325 MG tablet [[id number]]  ENDED

## 2022-03-06 NOTE — Telephone Encounter (Signed)
Pt's wife called in stating that she would like to know if pcp would still consider doing a home visit with pt since he has been discharged from facility. Pt's wife states that she has not given pt any meds since he has been home due to not knowing what meds the facility was giving him. Pt has been home from facility since 710. Pt's wife stated that she has given pt Benadryl for some itching that has started since he has been home. Please advise.  CB#: [phone]

## 2022-03-07 ENCOUNTER — Ambulatory Visit (INDEPENDENT_AMBULATORY_CARE_PROVIDER_SITE_OTHER): Payer: PPO | Admitting: Family Medicine

## 2022-03-07 ENCOUNTER — Other Ambulatory Visit: Payer: Self-pay | Admitting: Family Medicine

## 2022-03-07 VITALS — Ht 72.0 in | Wt 197.0 lb

## 2022-03-07 DIAGNOSIS — I4819 Other persistent atrial fibrillation: Secondary | ICD-10-CM

## 2022-03-07 DIAGNOSIS — E118 Type 2 diabetes mellitus with unspecified complications: Secondary | ICD-10-CM | POA: Diagnosis not present

## 2022-03-07 DIAGNOSIS — I6310 Cerebral infarction due to embolism of unspecified precerebral artery: Secondary | ICD-10-CM

## 2022-03-07 DIAGNOSIS — I5020 Unspecified systolic (congestive) heart failure: Secondary | ICD-10-CM | POA: Diagnosis not present

## 2022-03-07 DIAGNOSIS — Z7401 Bed confinement status: Secondary | ICD-10-CM | POA: Diagnosis not present

## 2022-03-07 MED ORDER — HYDROCODONE-ACETAMINOPHEN 10-325 MG PO TABS: 1.0000 | ORAL_TABLET | Freq: Four times a day (QID) | ORAL | 0 refills | Status: DC | PRN

## 2022-03-07 MED ORDER — CLOPIDOGREL BISULFATE 75 MG PO TABS
ORAL_TABLET | ORAL | 0 refills | Status: DC
Start: 2022-03-07 — End: 2022-04-06

## 2022-03-07 MED ORDER — PRAVASTATIN SODIUM 20 MG PO TABS
20.0000 mg | ORAL_TABLET | Freq: Every day | ORAL | 3 refills | Status: DC
Start: 2022-03-07 — End: 2022-04-19

## 2022-03-07 MED ORDER — LOSARTAN POTASSIUM 100 MG PO TABS: 100.0000 mg | ORAL_TABLET | Freq: Every day | ORAL | 3 refills | Status: DC

## 2022-03-07 MED ORDER — PANTOPRAZOLE SODIUM 40 MG PO TBEC: 40.0000 mg | DELAYED_RELEASE_TABLET | Freq: Every day | ORAL | 3 refills | Status: DC

## 2022-03-07 NOTE — Progress Notes (Signed)
Subjective:    Patient ID: Bernard Ramirez, male    DOB: 08-26-1930, 88 y.o.   MRN: [id number]  HPI  Today's visit is being conducted as a telephone visit.  The patient's wife is performing the visit.  She is currently at home.  I am currently in my office.  She consents to conduct the visit via telephone as the patient is confined to a bed and unable to come to the office.  Phone call began at 428.  Phone call concluded at 5:00. Unfortunately the patient was admitted to the hospital in April after suffering a stroke.  I have copied relevant portions of the discharge summary below: Admit date:     11/22/2021  Discharge date: 11/28/21  Discharge Physician: [place]    Discharge Diagnoses: Principal Problem:   Acute ischemic stroke [facility]) Active Problems:   Persistent atrial fibrillation ([place])   AKI (acute kidney injury) ([place])   Hyperlipidemia LDL goal <100   Hypothermia   Sinus bradycardia   Resolved Problems:   * No resolved hospital problems. [facility] Course: Patient is 88 years old male with past medical history of CVA, prostate cancer and atrial fibrillation not on anticoagulation but on Plavix, BPH, hypertension presented to hospital with weakness lethargy and excessive somnolence for few days prior to presentation.  In the ED, patient was hypothermic and bradycardic.  MRI of the brain and MR angiogram of the head showed multiple infarcts in mainly in the inferior to mid cerebellar region on the right in the PICA territory.  Patient was then admitted hospital for further evaluation and treatment   Assessment and plan Principal Problem:   Acute ischemic stroke [facility]) Active Problems:   Permanent atrial fibrillation ([place])   AKI (acute kidney injury) ([place])   Hyperlipidemia LDL goal <100   Hypothermia   Sinus bradycardia   Acute ischemic CVA:  Patient with a large cerebellar stroke and extensive old subcortical and cortical infarcts.   Seen by neurology.  Continue  Plavix and statins.  MRI of the brain, MRA of the neck shows a cerebellar infarct with right PICA territory infarct., 2D echocardiogram with  LV ejection fraction at 50 to 55% with global hypokinesis and mild LVH. PT,OT saw the the patient and recommended skilled nursing facility.   Hemoglobin A1c at 6.3.  Lipid panel with LDL of 107.    Neurology has seen the patient and did not recommend loop recorder at this time.  Recommendation is to continue Plavix and statins at this time.  Patient is allergic to aspirin   Metabolic encephalopathy.  Resolved.  Thought to be secondary to stroke. Blood cultures negative in 2 days.  Lactate was 1.2.  COVID and influenza was negative.  Urinalysis was negative.  No leukocytosis or fever.    Essential hypertension:  Resume Lasix and losartan from home   AKI:  Resolved.  Mild could be secondary to volume depletion.  Latest creatinine of 1.07.  Will be resumed on losartan from home.   GERD: Continue protonix at discharge   Persistent atrial fibrillation:  on Plavix.  Not on anticoagulation.   Hypothermia:  On presentation.  Received Retail banker.  Improved at this time.  No signs of sepsis.   Bradycardia:  Improved at this time.  Avoid nodal blockers.   Goals of care.  Patient had overall guarded prognosis.  Palliative care was consulted.  DNR status.   Consultants: Neurology   Procedures performed: None   Disposition:  Skilled nursing facility   The patient's wife removed the patient from skilled nursing facility on July 13 against their advice.  She states that they threatened to contact Adult Protective Services.  However the patient's wife states that they were not caring for her husband adequately at the skilled nursing facility.  There were discussions about hospice.  However the patient is stable.  Unfortunately he is confined to a bed.  He can no longer stand more than a few seconds without maximum assistance.  He is unable to walk.  Since discharge  home, he is not taking any medication other than Benadryl which the patient's wife is giving him due to itching on his back and on his shoulders from lying in bed.  His hospital discharge recommends Plavix 75 mg daily, pravastatin 20 mg daily, pantoprazole due to his history of GI bleeding, folic acid, vitamin D53, potassium 20 mill equivalents daily, losartan 100 mg daily.  Apparently the patient was taking this at the nursing home however the wife has not resumed them since he has been home.  She wanted to talk to me about what medication he should resume.  Wife states that he is eating well.  She prepares his food for him and feeds him in bed.  He has a bowel movement every day however he is frequently incontinent in bed.  He does get confused and has a difficult time communicating. Past Medical History:  Diagnosis Date   AKI (acute kidney injury) ([place]) 10/08/2016   Anemia    04/2012: H&H-10.2/31.5, MCV-77; 06/2012:12/38.9   Arthritis    Benign prostatic hypertrophy    s/p transurethral resection of the prostate   Cellulitis 02/01/2021   Chest pain    2004; with dyspnea, stress nuclear-normal EF + questionable inferior wall ischemia, normal coronary angiography;  2010-negative stress nuclear   CVA (cerebral infarction)    asymptomatic (seen on CT)   Diarrhea    h/o Hemoccult-positive stool   Dysrhythmia    Edema of both legs    GERD (gastroesophageal reflux disease)    History of blood transfusion    History of kidney stones    History of urinary tract infection    Hypertension    Jerking movements of extremities    left arm    Low back pain    Obstructive sleep apnea    pt states can not use the CPAP   Peripheral neuropathy    lower legs and feet bilat    Persistent atrial fibrillation ([place]) 2013   Asymptomatic; diagnosed in 05/2012   Pneumonia    Prostate cancer ([place]) 2017   stage 4   Shortness of breath dyspnea    pt states only develops SOB if tries to do something too quickly    Upper GI bleed 1985   1985-peptic ulcer disease; initial hemigastrectomy; subsequent Billroth II   Urinary hesitancy    Vertigo    ED evaluation-06/2012   Past Surgical History:  Procedure Laterality Date   Billroth II     CHOLECYSTECTOMY     COLONOSCOPY  Approximately 2000   COLONOSCOPY  10/2012   Colonoscopy March 2014 at [facility], difficulty exam requiring fluoroscopy and overtube. Patient developed severe bradycardia again during his procedure like he did [facility]. Multiple polyps removed which were tubular adenomas. Previously tattooed sites at 20 and 30 cm were free of any polypoid change. Left-sided diverticulosis noted.   COLONOSCOPY WITH ESOPHAGOGASTRODUODENOSCOPY (EGD)  06-17-2009   GDJ:MEQASTMH tubular adenomas and 2  tubulovillous adenomas, incomplete, 3 clips placed   ESOPHAGOGASTRODUODENOSCOPY  06/17/2009   SLF: normal/chronic gastritis (non-h pylori)   ESOPHAGOGASTRODUODENOSCOPY N/A 05/11/2015   YFV:CBSWHQP anemai due to postgastrectomy state/moderate erosive gastritis   INGUINAL HERNIA REPAIR     Left   ROTATOR CUFF REPAIR  1991   Bilateral   TRANSURETHRAL RESECTION OF PROSTATE  06/2016   TRANSURETHRAL RESECTION OF PROSTATE N/A 12/27/2012   Procedure: TRANSURETHRAL RESECTION OF THE PROSTATE (TURP);  Surgeon: Marissa Nestle, MD;  Location: [facility];  Service: Urology;  Laterality: N/A;   TRANSURETHRAL RESECTION OF PROSTATE N/A 04/11/2016   Procedure: TRANSURETHRAL RESECTION OF THE PROSTATE (TURP);  Surgeon: Irine Seal, MD;  Location: [facility];  Service: Urology;  Laterality: N/A;   VAGOTOMY AND PYLOROPLASTY  1970s   Details of procedure uncertain; 5916B   Patient's current medication list is Benadryl only.  He has not been taking any medication for almost 6 days Allergies  Allergen Reactions   Contrast Media [Iodinated Contrast Media]     Pt states he was given "dye" 20 yrs ago, anaphylaxis & syncope - was told to never have it again    Gabapentin Itching    Aspirin Other (See Comments)    Blistering    Latex Rash   Lipitor [Atorvastatin] Itching   Tape Rash    EKG leads   Social History   Socioeconomic History   Marital status: Married    Spouse name: [name]   Number of children: 5   Years of education: Not on file   Highest education level: Not on file  Occupational History   Occupation: Retired Pensions consultant: RETIRED  Tobacco Use   Smoking status: Former    Types: Cigars    Quit date: 08/22/2003    Years since quitting: 18.5   Smokeless tobacco: Never   Tobacco comments:    Quit many years ago; few cigars per day; didn't inhale  Vaping Use   Vaping Use: Never used  Substance and Sexual Activity   Alcohol use: No    Alcohol/week: 1.0 standard drink of alcohol    Types: 1 Standard drinks or equivalent per week   Drug use: No   Sexual activity: Not on file  Other Topics Concern   Not on file  Social History Narrative   Lives w/ wife in [place]   Retired truck Geophysicist/field seismologist   Social Determinants of Health   Financial Resource Strain: [place]  (05/05/2021)   Overall Financial Resource Strain (CARDIA)    Difficulty of Paying Living Expenses: Not hard at all  Food Insecurity: No Food Insecurity (05/05/2021)   Hunger Vital Sign    Worried About Running Out of Food in the Last Year: Never true    Watchtower in the Last Year: Never true  Transportation Needs: No Transportation Needs (05/05/2021)   PRAPARE - Hydrologist (Medical): No    Lack of Transportation (Non-Medical): No  Physical Activity: Inactive (05/05/2021)   Exercise Vital Sign    Days of Exercise per Week: 0 days    Minutes of Exercise per Session: 0 min  Stress: No Stress Concern Present (05/05/2021)   [place]    Feeling of Stress : Not at all  Social Connections: Socially Isolated (05/05/2021)   Social Connection and Isolation Panel [NHANES]     Frequency of Communication with Friends and Family: Never    Frequency  of Social Gatherings with Friends and Family: Once a week    Attends Religious Services: Never    Marine scientist or Organizations: No    Attends Archivist Meetings: Never    Marital Status: Married  Human resources officer Violence: Not At Risk (05/05/2021)   Humiliation, Afraid, Rape, and Kick questionnaire    Fear of Current or Ex-Partner: No    Emotionally Abused: No    Physically Abused: No    Sexually Abused: No    Review of Systems  All other systems reviewed and are negative.      Objective:   Physical Exam   Physical exam could not be performed as the patient was seen via telephone     Assessment & Plan:   Cerebrovascular accident (CVA) due to embolism of precerebral artery (HCC)  Systolic congestive heart failure, unspecified HF chronicity ([place])  Controlled type 2 diabetes mellitus with complication, without long-term current use of insulin (HCC)  Persistent atrial fibrillation ([place])  Bedbound Patient has a very guarded prognosis.  He is not currently on anticoagulation despite his history of atrial fibrillation due to a history of GI bleeding.  For that reason I recommended that she resume Protonix 40 mg daily.  I will continue the patient on Plavix 75 mg daily for stroke prevention as he has tolerated this in the past without GI bleed.  Continue losartan 100 mg daily to maximize risk factor reduction in controlling blood pressure as well as treatment for congestive heart failure.  Resume pravastatin 20 mg daily.  The patient was taking 4-5 pain pills a day for chronic joint pain and knee pain.  He had been on this medication prior to coming to my practice from his previous primary care physician for years.  The patient's wife is inquiring about this at the present time.  I recommended that the patient not take pain medication due to the fact he is bedbound and therefore I question if he  could be having pain in his knees and in his joints if he is not mobile.  Also explained to the patient's wife that pain medication causes confusion and constipation and would likely increase risk for complication.  However the patient's wife states that the patient is complaining of low back pain and discomfort while lying in bed and is requesting something for pain.  I will refill his Norco 10/325 but I would drastically reduce the amount.  I recommended no more than 1 to 2 pills/day.  I will give the patient's wife 60 tablets rather than his previous allotment.  I recommended that she try to minimize the amount of medication he takes.  Hopefully the 60 tablets will last more than a month.  Consider hospice referral if patient's health deteriorates further

## 2022-03-08 NOTE — Telephone Encounter (Signed)
Duplicate request.  Requested Prescriptions  Pending Prescriptions Disp Refills  . clopidogrel (PLAVIX) 75 MG tablet [Pharmacy Med Name: CLOPIDOGREL '75MG'$  TABLETS] 90 tablet     Sig: TAKE 1 TABLET(75 MG) BY MOUTH DAILY     Hematology: Antiplatelets - clopidogrel Failed - 03/07/2022  5:45 PM      Failed - HCT in normal range and within 180 days    HCT  Date Value Ref Range Status  01/20/2022 37.2 (L) 39.0 - 52.0 % Final  08/29/2012 40 % Final   Hematocrit  Date Value Ref Range Status  05/19/2019 37.7 37.5 - 51.0 % Final         Failed - HGB in normal range and within 180 days    Hemoglobin  Date Value Ref Range Status  01/20/2022 11.4 (L) 13.0 - 17.0 g/dL Final  05/19/2019 12.0 (L) 13.0 - 17.7 g/dL Final         Passed - PLT in normal range and within 180 days    Platelets  Date Value Ref Range Status  01/20/2022 225 150 - 400 K/uL Final  05/19/2019 218 150 - 450 x10E3/uL Final         Passed - Cr in normal range and within 360 days    Creat  Date Value Ref Range Status  09/15/2021 1.21 0.70 - 1.22 mg/dL Final   Creatinine, Ser  Date Value Ref Range Status  01/20/2022 0.88 0.61 - 1.24 mg/dL Final         Passed - Valid encounter within last 6 months    Recent Outpatient Visits          4 months ago Systolic congestive heart failure, unspecified HF chronicity ([place])   [place] [name], MD   4 months ago Systolic congestive heart failure, unspecified HF chronicity ([place])   [place] [name], MD   4 months ago Systolic congestive heart failure, unspecified HF chronicity ([place])   [place] [name], MD   4 months ago Neck mass   [place] Pickard, Cammie Mcgee, MD   5 months ago Controlled type 2 diabetes mellitus with complication, without long-term current use of insulin ([place])   [facility] Pickard, Cammie Mcgee, MD

## 2022-03-14 ENCOUNTER — Telehealth: Payer: Self-pay

## 2022-03-14 NOTE — Telephone Encounter (Signed)
Pt's wife called in stating that pt needs a PA sent for this med to be refilled   HYDROcodone-acetaminophen (NORCO) 10-325 MG tablet    Cb#: [phone]

## 2022-03-14 NOTE — Telephone Encounter (Signed)
PA has been sent to plan (insurance). Just waiting until we hear something from them. Will call pt as soon as we get a response

## 2022-03-17 ENCOUNTER — Telehealth: Payer: Self-pay

## 2022-03-17 ENCOUNTER — Other Ambulatory Visit: Payer: Self-pay | Admitting: Family Medicine

## 2022-03-17 MED ORDER — GABAPENTIN 300 MG PO CAPS: 300.0000 mg | ORAL_CAPSULE | Freq: Three times a day (TID) | ORAL | 3 refills | Status: DC | PRN

## 2022-03-17 NOTE — Telephone Encounter (Signed)
Spoke with Tonia Ghent, Rx is called per Dr. Dennard Schaumann for pt. Wife aware/voiced understanding.

## 2022-03-17 NOTE — Telephone Encounter (Addendum)
Spoke w/pt, pt has no other symptoms, per wife, pt keeps saying that his arm is on fire/burning. Per wife this has been going on for about 3-4 days.   Pls advice

## 2022-03-17 NOTE — Telephone Encounter (Signed)
Pt's wife came in to office stating pt is c/o a burning pain that runs from pt's right side of neck down his arm. Pt's wife is asking if pcp could send something to pharmacy to either rub on this area or take by mouth for pain please. Please advise.  Cb#: [phone]

## 2022-03-31 ENCOUNTER — Other Ambulatory Visit: Payer: Self-pay | Admitting: Family Medicine

## 2022-03-31 ENCOUNTER — Telehealth: Payer: Self-pay

## 2022-03-31 NOTE — Telephone Encounter (Signed)
Pt's wife called in stating that she has been giving pt otc Mucinex for almost 2 weeks now, and pt is still having a pretty bad cough. Pt is still unable to walk to get into the office to be seen. Pt's wife states that pt has been coughing up white phlegm. Pt's wife would like some advice about what she can do or get to help with this. Please advise.  Cb#: [phone]

## 2022-03-31 NOTE — Telephone Encounter (Signed)
Requested medication (s) are due for refill today:No  Requested medication (s) are on the active medication list: NO    Last refill: 07/28/21 #30  3 refills  Future visit scheduled with nurse 05/11/22  Notes to clinic:Discontinued 03/07/22. Per protocol cannot refuse non-delegated meds.  Requested Prescriptions  Pending Prescriptions Disp Refills   meclizine (ANTIVERT) 25 MG tablet [Pharmacy Med Name: MECLIZINE '25MG'$  RX TABLETS] 30 tablet 3    Sig: TAKE 1 TABLET BY MOUTH AT BEDTIME     Not Delegated - Gastroenterology: Antiemetics Failed - 03/31/2022  8:17 AM      Failed - This refill cannot be delegated      Passed - Valid encounter within last 6 months    Recent Outpatient Visits           4 months ago Systolic congestive heart failure, unspecified HF chronicity ([place])   [place] Pickard, Cammie Mcgee, MD   4 months ago Systolic congestive heart failure, unspecified HF chronicity ([place])   [place] [name], MD   5 months ago Systolic congestive heart failure, unspecified HF chronicity ([place])   [place] Pickard, Cammie Mcgee, MD   5 months ago Neck mass   [place] [name], MD   6 months ago Controlled type 2 diabetes mellitus with complication, without long-term current use of insulin ([place])   [facility] Pickard, Cammie Mcgee, MD

## 2022-04-03 ENCOUNTER — Telehealth: Payer: Self-pay

## 2022-04-03 NOTE — Telephone Encounter (Signed)
Pt's wife came in pleading for pcp to please consider doing a home visit for pt. Pt is not doing well and pt's wife is not wanting to take pt to E.R. Pt's wife is concerned that pt may have had another stroke. Pt's wife states that pt is hardly speaking.    Please call asap: [phone]

## 2022-04-05 ENCOUNTER — Other Ambulatory Visit: Payer: Self-pay | Admitting: Family Medicine

## 2022-04-06 ENCOUNTER — Other Ambulatory Visit: Payer: Self-pay | Admitting: Family Medicine

## 2022-04-06 DIAGNOSIS — I6340 Cerebral infarction due to embolism of unspecified cerebral artery: Secondary | ICD-10-CM

## 2022-04-06 NOTE — Telephone Encounter (Signed)
Requested Prescriptions  Pending Prescriptions Disp Refills  . clopidogrel (PLAVIX) 75 MG tablet [Pharmacy Med Name: CLOPIDOGREL '75MG'$  TABLETS] 90 tablet     Sig: TAKE 1 TABLET(75 MG) BY MOUTH DAILY     Hematology: Antiplatelets - clopidogrel Failed - 04/05/2022  6:37 PM      Failed - HCT in normal range and within 180 days    HCT  Date Value Ref Range Status  01/20/2022 37.2 (L) 39.0 - 52.0 % Final  08/29/2012 40 % Final   Hematocrit  Date Value Ref Range Status  05/19/2019 37.7 37.5 - 51.0 % Final         Failed - HGB in normal range and within 180 days    Hemoglobin  Date Value Ref Range Status  01/20/2022 11.4 (L) 13.0 - 17.0 g/dL Final  05/19/2019 12.0 (L) 13.0 - 17.7 g/dL Final         Passed - PLT in normal range and within 180 days    Platelets  Date Value Ref Range Status  01/20/2022 225 150 - 400 K/uL Final  05/19/2019 218 150 - 450 x10E3/uL Final         Passed - Cr in normal range and within 360 days    Creat  Date Value Ref Range Status  09/15/2021 1.21 0.70 - 1.22 mg/dL Final   Creatinine, Ser  Date Value Ref Range Status  01/20/2022 0.88 0.61 - 1.24 mg/dL Final         Passed - Valid encounter within last 6 months    Recent Outpatient Visits          5 months ago Systolic congestive heart failure, unspecified HF chronicity ([place])   [place] [name], MD   5 months ago Systolic congestive heart failure, unspecified HF chronicity ([place])   [place] [name], MD   5 months ago Systolic congestive heart failure, unspecified HF chronicity ([place])   [place] [name], MD   5 months ago Neck mass   [place] [name], MD   6 months ago Controlled type 2 diabetes mellitus with complication, without long-term current use of insulin ([place])   [facility] Pick[place], Cammie Mcgee, MD

## 2022-04-13 ENCOUNTER — Other Ambulatory Visit: Payer: Self-pay | Admitting: Family Medicine

## 2022-04-13 ENCOUNTER — Other Ambulatory Visit: Payer: Self-pay

## 2022-04-13 MED ORDER — HYDROCODONE-ACETAMINOPHEN 10-325 MG PO TABS: 1.0000 | ORAL_TABLET | Freq: Four times a day (QID) | ORAL | 0 refills | Status: DC | PRN

## 2022-04-13 NOTE — Telephone Encounter (Signed)
Pt's wife called in requesting a refill of HYDROcodone-acetaminophen (NORCO) 10-325 MG tablet [[id number]]    Order Details Dose: 1 tablet Route: Oral Frequency: Every 6 hours PRN  To be sent to the [facility] on [address].  Cb#: [phone]

## 2022-04-16 ENCOUNTER — Inpatient Hospital Stay (HOSPITAL_COMMUNITY)
Admission: EM | Admit: 2022-04-16 | Discharge: 2022-04-19 | DRG: 175 | Disposition: A | Discharge status: Hospice | Payer: PPO | Attending: Internal Medicine | Admitting: Internal Medicine

## 2022-04-16 ENCOUNTER — Encounter (HOSPITAL_COMMUNITY): Payer: Self-pay

## 2022-04-16 ENCOUNTER — Other Ambulatory Visit: Payer: Self-pay

## 2022-04-16 ENCOUNTER — Emergency Department (HOSPITAL_COMMUNITY): Payer: PPO

## 2022-04-16 DIAGNOSIS — Z87891 Personal history of nicotine dependence: Secondary | ICD-10-CM

## 2022-04-16 DIAGNOSIS — Z886 Allergy status to analgesic agent status: Secondary | ICD-10-CM

## 2022-04-16 DIAGNOSIS — Z7401 Bed confinement status: Secondary | ICD-10-CM | POA: Diagnosis not present

## 2022-04-16 DIAGNOSIS — G629 Polyneuropathy, unspecified: Secondary | ICD-10-CM | POA: Diagnosis present

## 2022-04-16 DIAGNOSIS — E8809 Other disorders of plasma-protein metabolism, not elsewhere classified: Secondary | ICD-10-CM | POA: Diagnosis not present

## 2022-04-16 DIAGNOSIS — I4819 Other persistent atrial fibrillation: Secondary | ICD-10-CM | POA: Diagnosis present

## 2022-04-16 DIAGNOSIS — I119 Hypertensive heart disease without heart failure: Secondary | ICD-10-CM | POA: Diagnosis present

## 2022-04-16 DIAGNOSIS — G4733 Obstructive sleep apnea (adult) (pediatric): Secondary | ICD-10-CM | POA: Diagnosis present

## 2022-04-16 DIAGNOSIS — Z8673 Personal history of transient ischemic attack (TIA), and cerebral infarction without residual deficits: Secondary | ICD-10-CM

## 2022-04-16 DIAGNOSIS — Z888 Allergy status to other drugs, medicaments and biological substances status: Secondary | ICD-10-CM

## 2022-04-16 DIAGNOSIS — J9811 Atelectasis: Secondary | ICD-10-CM | POA: Diagnosis present

## 2022-04-16 DIAGNOSIS — E876 Hypokalemia: Secondary | ICD-10-CM | POA: Diagnosis not present

## 2022-04-16 DIAGNOSIS — Z8711 Personal history of peptic ulcer disease: Secondary | ICD-10-CM

## 2022-04-16 DIAGNOSIS — E782 Mixed hyperlipidemia: Secondary | ICD-10-CM

## 2022-04-16 DIAGNOSIS — I2699 Other pulmonary embolism without acute cor pulmonale: Principal | ICD-10-CM | POA: Diagnosis present

## 2022-04-16 DIAGNOSIS — Z66 Do not resuscitate: Secondary | ICD-10-CM | POA: Diagnosis present

## 2022-04-16 DIAGNOSIS — I63532 Cerebral infarction due to unspecified occlusion or stenosis of left posterior cerebral artery: Secondary | ICD-10-CM | POA: Diagnosis not present

## 2022-04-16 DIAGNOSIS — I82412 Acute embolism and thrombosis of left femoral vein: Secondary | ICD-10-CM | POA: Diagnosis present

## 2022-04-16 DIAGNOSIS — G9341 Metabolic encephalopathy: Secondary | ICD-10-CM | POA: Diagnosis present

## 2022-04-16 DIAGNOSIS — R627 Adult failure to thrive: Secondary | ICD-10-CM | POA: Diagnosis not present

## 2022-04-16 DIAGNOSIS — E440 Moderate protein-calorie malnutrition: Secondary | ICD-10-CM | POA: Diagnosis present

## 2022-04-16 DIAGNOSIS — C7951 Secondary malignant neoplasm of bone: Secondary | ICD-10-CM | POA: Diagnosis not present

## 2022-04-16 DIAGNOSIS — Z87440 Personal history of urinary (tract) infections: Secondary | ICD-10-CM

## 2022-04-16 DIAGNOSIS — E46 Unspecified protein-calorie malnutrition: Secondary | ICD-10-CM | POA: Diagnosis not present

## 2022-04-16 DIAGNOSIS — Z515 Encounter for palliative care: Secondary | ICD-10-CM | POA: Diagnosis not present

## 2022-04-16 DIAGNOSIS — I4891 Unspecified atrial fibrillation: Secondary | ICD-10-CM | POA: Diagnosis present

## 2022-04-16 DIAGNOSIS — I2609 Other pulmonary embolism with acute cor pulmonale: Secondary | ICD-10-CM | POA: Diagnosis not present

## 2022-04-16 DIAGNOSIS — I2693 Single subsegmental pulmonary embolism without acute cor pulmonale: Secondary | ICD-10-CM | POA: Diagnosis not present

## 2022-04-16 DIAGNOSIS — Z87442 Personal history of urinary calculi: Secondary | ICD-10-CM

## 2022-04-16 DIAGNOSIS — R1310 Dysphagia, unspecified: Secondary | ICD-10-CM | POA: Diagnosis present

## 2022-04-16 DIAGNOSIS — K219 Gastro-esophageal reflux disease without esophagitis: Secondary | ICD-10-CM | POA: Diagnosis not present

## 2022-04-16 DIAGNOSIS — D630 Anemia in neoplastic disease: Secondary | ICD-10-CM | POA: Diagnosis present

## 2022-04-16 DIAGNOSIS — I639 Cerebral infarction, unspecified: Secondary | ICD-10-CM

## 2022-04-16 DIAGNOSIS — R7303 Prediabetes: Secondary | ICD-10-CM

## 2022-04-16 DIAGNOSIS — Z7189 Other specified counseling: Secondary | ICD-10-CM | POA: Diagnosis not present

## 2022-04-16 DIAGNOSIS — Z6826 Body mass index (BMI) 26.0-26.9, adult: Secondary | ICD-10-CM | POA: Diagnosis not present

## 2022-04-16 DIAGNOSIS — I82512 Chronic embolism and thrombosis of left femoral vein: Secondary | ICD-10-CM | POA: Diagnosis not present

## 2022-04-16 DIAGNOSIS — Z9079 Acquired absence of other genital organ(s): Secondary | ICD-10-CM

## 2022-04-16 DIAGNOSIS — N400 Enlarged prostate without lower urinary tract symptoms: Secondary | ICD-10-CM | POA: Diagnosis not present

## 2022-04-16 DIAGNOSIS — J690 Pneumonitis due to inhalation of food and vomit: Secondary | ICD-10-CM | POA: Diagnosis not present

## 2022-04-16 DIAGNOSIS — R739 Hyperglycemia, unspecified: Secondary | ICD-10-CM

## 2022-04-16 DIAGNOSIS — R531 Weakness: Secondary | ICD-10-CM | POA: Diagnosis not present

## 2022-04-16 DIAGNOSIS — Z79899 Other long term (current) drug therapy: Secondary | ICD-10-CM

## 2022-04-16 DIAGNOSIS — Z91040 Latex allergy status: Secondary | ICD-10-CM

## 2022-04-16 DIAGNOSIS — Z8546 Personal history of malignant neoplasm of prostate: Secondary | ICD-10-CM

## 2022-04-16 DIAGNOSIS — Z9049 Acquired absence of other specified parts of digestive tract: Secondary | ICD-10-CM

## 2022-04-16 DIAGNOSIS — R4182 Altered mental status, unspecified: Secondary | ICD-10-CM | POA: Diagnosis not present

## 2022-04-16 DIAGNOSIS — Z20822 Contact with and (suspected) exposure to covid-19: Secondary | ICD-10-CM | POA: Diagnosis present

## 2022-04-16 DIAGNOSIS — Z833 Family history of diabetes mellitus: Secondary | ICD-10-CM

## 2022-04-16 DIAGNOSIS — Z91041 Radiographic dye allergy status: Secondary | ICD-10-CM

## 2022-04-16 LAB — COMPREHENSIVE METABOLIC PANEL
ALT: 14 U/L (ref 0–44)
AST: 18 U/L (ref 15–41)
Albumin: 2.7 g/dL — ABNORMAL LOW (ref 3.5–5.0)
Alkaline Phosphatase: 96 U/L (ref 38–126)
Anion gap: 8 (ref 5–15)
BUN: 14 mg/dL (ref 8–23)
CO2: 29 mmol/L (ref 22–32)
Calcium: 9.1 mg/dL (ref 8.9–10.3)
Chloride: 102 mmol/L (ref 98–111)
Creatinine, Ser: 0.64 mg/dL (ref 0.61–1.24)
GFR, Estimated: >60 mL/min (ref >60)
Glucose, Bld: 189 mg/dL — ABNORMAL HIGH (ref 70–99)
Potassium: 4.1 mmol/L (ref 3.5–5.1)
Sodium: 139 mmol/L (ref 135–145)
Total Bilirubin: 0.7 mg/dL (ref 0.3–1.2)
Total Protein: 6.4 g/dL — ABNORMAL LOW (ref 6.5–8.1)

## 2022-04-16 LAB — BLOOD GAS, VENOUS
Acid-Base Excess: 7.5 mmol/L — ABNORMAL HIGH (ref 0.0–2.0)
Bicarbonate: 34.1 mmol/L — ABNORMAL HIGH (ref 20.0–28.0)
Drawn by: 442
FIO2: 21 %
O2 Saturation: 78 %
Patient temperature: 36.9
pCO2, Ven: 55 mmHg (ref 44–60)
pH, Ven: 7.4 (ref 7.25–7.43)
pO2, Ven: 46 mmHg — ABNORMAL HIGH (ref 32–45)

## 2022-04-16 LAB — CBC WITH DIFFERENTIAL/PLATELET
Abs Immature Granulocytes: 0.02 10*3/uL (ref 0.00–0.07)
Basophils Absolute: 0 10*3/uL (ref 0.0–0.1)
Basophils Relative: 0 %
Eosinophils Absolute: 0.3 10*3/uL (ref 0.0–0.5)
Eosinophils Relative: 4 %
HCT: 41.1 % (ref 39.0–52.0)
Hemoglobin: 12.2 g/dL — ABNORMAL LOW (ref 13.0–17.0)
Immature Granulocytes: 0 %
Lymphocytes Relative: 21 %
Lymphs Abs: 1.5 10*3/uL (ref 0.7–4.0)
MCH: 24.9 pg — ABNORMAL LOW (ref 26.0–34.0)
MCHC: 29.7 g/dL — ABNORMAL LOW (ref 30.0–36.0)
MCV: 84 fL (ref 80.0–100.0)
Monocytes Absolute: 0.6 10*3/uL (ref 0.1–1.0)
Monocytes Relative: 8 %
Neutro Abs: 4.8 10*3/uL (ref 1.7–7.7)
Neutrophils Relative %: 67 %
Platelets: 245 10*3/uL (ref 150–400)
RBC: 4.89 MIL/uL (ref 4.22–5.81)
RDW: 15.2 % (ref 11.5–15.5)
WBC: 7.1 10*3/uL (ref 4.0–10.5)
nRBC: 0 % (ref 0.0–0.2)

## 2022-04-16 LAB — AMMONIA: Ammonia: 31 umol/L (ref 9–35)

## 2022-04-16 LAB — TROPONIN I (HIGH SENSITIVITY): Troponin I (High Sensitivity): 15 ng/L (ref <18)

## 2022-04-16 LAB — SARS CORONAVIRUS 2 BY RT PCR: SARS Coronavirus 2 by RT PCR: NEGATIVE

## 2022-04-16 LAB — D-DIMER, QUANTITATIVE: D-Dimer, Quant: 3.17 ug/mL-FEU — ABNORMAL HIGH (ref 0.00–0.50)

## 2022-04-16 MED ORDER — IOHEXOL 350 MG/ML SOLN
75.0000 mL | Freq: Once | INTRAVENOUS | Status: AC | PRN
  Administered 2022-04-16: 75 mL via INTRAVENOUS

## 2022-04-16 MED ORDER — HYDRALAZINE HCL 20 MG/ML IJ SOLN: 10.0000 mg | Freq: Four times a day (QID) | INTRAMUSCULAR | Status: DC | PRN

## 2022-04-16 MED ORDER — HEPARIN (PORCINE) 25000 UT/250ML-% IV SOLN
1450.0000 [IU]/h | INTRAVENOUS | Status: DC
  Administered 2022-04-16: 1450 [IU]/h via INTRAVENOUS
  Filled 2022-04-16 (×2): qty 250

## 2022-04-16 MED ORDER — DIPHENHYDRAMINE HCL 50 MG/ML IJ SOLN
25.0000 mg | Freq: Once | INTRAMUSCULAR | Status: AC
  Administered 2022-04-16: 25 mg via INTRAVENOUS
  Filled 2022-04-16: qty 1

## 2022-04-16 MED ORDER — HEPARIN BOLUS VIA INFUSION
4000.0000 [IU] | Freq: Once | INTRAVENOUS | Status: AC
Start: 2022-04-16 — End: 2022-04-16
  Administered 2022-04-16: 4000 [IU] via INTRAVENOUS

## 2022-04-16 MED ORDER — SODIUM CHLORIDE 0.9 % IV SOLN: INTRAVENOUS | Status: DC

## 2022-04-16 MED ORDER — SODIUM CHLORIDE 0.9 % IV SOLN
500.0000 mg | Freq: Once | INTRAVENOUS | Status: AC
  Administered 2022-04-16: 500 mg via INTRAVENOUS
  Filled 2022-04-16: qty 5

## 2022-04-16 MED ORDER — DIPHENHYDRAMINE HCL 50 MG/ML IJ SOLN: 50.0000 mg | Freq: Once | INTRAMUSCULAR | Status: DC

## 2022-04-16 MED ORDER — LACTATED RINGERS IV BOLUS
500.0000 mL | Freq: Once | INTRAVENOUS | Status: AC
  Administered 2022-04-16: 500 mL via INTRAVENOUS

## 2022-04-16 MED ORDER — DIPHENHYDRAMINE HCL 25 MG PO CAPS: 50.0000 mg | ORAL_CAPSULE | Freq: Once | ORAL | Status: DC

## 2022-04-16 MED ORDER — DIPHENHYDRAMINE HCL 25 MG PO CAPS: 25.0000 mg | ORAL_CAPSULE | Freq: Once | ORAL | Status: AC

## 2022-04-16 MED ORDER — METHYLPREDNISOLONE SODIUM SUCC 40 MG IJ SOLR
40.0000 mg | Freq: Once | INTRAMUSCULAR | Status: AC
  Administered 2022-04-16: 40 mg via INTRAVENOUS
  Filled 2022-04-16: qty 1

## 2022-04-16 MED ORDER — PANTOPRAZOLE SODIUM 40 MG IV SOLR
40.0000 mg | INTRAVENOUS | Status: DC
Start: 2022-04-16 — End: 2022-04-18
  Administered 2022-04-17 – 2022-04-18 (×2): 40 mg via INTRAVENOUS
  Filled 2022-04-16 (×2): qty 10

## 2022-04-16 MED ORDER — SODIUM CHLORIDE 0.9 % IV SOLN
3.0000 g | Freq: Once | INTRAVENOUS | Status: AC
  Administered 2022-04-16: 3 g via INTRAVENOUS
  Filled 2022-04-16: qty 8

## 2022-04-16 NOTE — ED Notes (Signed)
Admitting MD at bedsice

## 2022-04-16 NOTE — ED Provider Notes (Signed)
[facility] Provider Note   CSN: [id number] Arrival date & time: 04/16/22  1336   History  Chief Complaint  Patient presents with   Weakness    ALDER MURRI is a 88 y.o. male.  HPI 88 year old male presents with change in mental status/less a weakness.  History is taken from his wife at the bedside.  Patient had a stroke in April 2023 and has had progressive decline since.  After discharge from the hospital he went to a facility and his wife took him out of the facility in the middle of July.  She has been taking care of him ever since.  He cannot feed himself or get up.  She changes his diaper and feeds him.  He had pneumonia towards the end of his visit in the facility and then his doctor put him on Augmentin which she is finishing up for a respiratory infection.  Patient has cough and sputum.  However over the last 48 hours he has been worse which she describes as intermittent episodes of increased work of breathing and being less alert.  He has chronic right arm weakness compared to left and both legs he can barely move.  He is bedbound.  No known fevers, vomiting, diarrhea. She's worried about another stroke  Home Medications Prior to Admission medications   Medication Sig Start Date End Date Taking? Authorizing Provider  clopidogrel (PLAVIX) 75 MG tablet TAKE 1 TABLET(75 MG) BY MOUTH DAILY 04/06/22   [name], MD  Cyanocobalamin (B-12 COMPLIANCE INJECTION) 1000 MCG/ML KIT Inject 1,000 mcg as directed every 30 (thirty) days.    [provider]  folic acid (FOLVITE) 1 MG tablet Take 1 mg by mouth daily.    [provider]  gabapentin (NEURONTIN) 300 MG capsule Take 1 capsule (300 mg total) by mouth 3 (three) times daily as needed (nerve pain). 03/17/22   [name], MD  HYDROcodone-acetaminophen (NORCO) 10-325 MG tablet Take 1 tablet by mouth every 6 (six) hours as needed. 04/13/22   [name], MD  Infant Care Products  [facility]) OINT Apply 1 application. topically 2 (two) times daily. 01/14/22   [provider]  losartan (COZAAR) 100 MG tablet Take 1 tablet (100 mg total) by mouth daily. 03/07/22   [name], MD  pantoprazole (PROTONIX) 40 MG tablet Take 1 tablet (40 mg total) by mouth daily. 03/07/22   [name], MD  pravastatin (PRAVACHOL) 20 MG tablet Take 1 tablet (20 mg total) by mouth daily. 03/07/22   [name], MD      Allergies    Contrast media [iodinated contrast media], Gabapentin, Aspirin, Latex, Lipitor [atorvastatin], and Tape    Review of Systems   Review of Systems  Constitutional:  Positive for fatigue. Negative for fever.  Respiratory:  Positive for cough and shortness of breath.   Gastrointestinal:  Negative for diarrhea and vomiting.    Physical Exam Updated Vital Signs BP (!) 143/94 (BP Location: Right Arm)   Pulse 85   Temp (!) 97.3 F (36.3 C) (Oral)   Resp (!) 23   Ht 6' (1.829 m)   Wt 89.4 kg   SpO2 96%   BMI 26.72 kg/m  Physical Exam Vitals and nursing note reviewed.  Constitutional:      Appearance: He is well-developed.  HENT:     Head: Normocephalic and atraumatic.  Eyes:     Pupils: Pupils are equal, round, and reactive to light.  Cardiovascular:     Rate and Rhythm: Normal rate and regular rhythm.     Heart sounds: Normal heart sounds.  Pulmonary:     Effort: Pulmonary effort is normal. Tachypnea present. No accessory muscle usage or respiratory distress.     Breath sounds: Normal breath sounds. No wheezing.  Abdominal:     General: There is no distension.     Palpations: Abdomen is soft.     Tenderness: There is no abdominal tenderness.  Skin:    General: Skin is warm and dry.  Neurological:     Mental Status: He is alert.     Comments: Is awake and follows commands but I am unable to understand his speech which wife states is his new baseline after the stroke.  He does weakly grip in both hands though the right is a  little weaker than left.  Both legs he can shuffle a little bit side to side but cannot lift off the stretcher.     ED Results / Procedures / Treatments   Labs (all labs ordered are listed, but only abnormal results are displayed) Labs Reviewed  CBC WITH DIFFERENTIAL/PLATELET - Abnormal; Notable for the following components:      Result Value   Hemoglobin 12.2 (*)    MCH 24.9 (*)    MCHC 29.7 (*)    All other components within normal limits  COMPREHENSIVE METABOLIC PANEL - Abnormal; Notable for the following components:   Glucose, Bld 189 (*)    Total Protein 6.4 (*)    Albumin 2.7 (*)    All other components within normal limits  BLOOD GAS, VENOUS - Abnormal; Notable for the following components:   pO2, Ven 46 (*)    Bicarbonate 34.1 (*)    Acid-Base Excess 7.5 (*)    All other components within normal limits  D-DIMER, QUANTITATIVE (NOT AT [facility]) - Abnormal; Notable for the following components:   D-Dimer, Quant 3.17 (*)    All other components within normal limits  SARS CORONAVIRUS 2 BY RT PCR  CULTURE, BLOOD (ROUTINE X 2)  CULTURE, BLOOD (ROUTINE X 2)  AMMONIA  HEPARIN LEVEL (UNFRACTIONATED)  CBC  URINALYSIS, ROUTINE W REFLEX MICROSCOPIC  TROPONIN I (HIGH SENSITIVITY)    EKG EKG Interpretation  Date/Time:  Sunday April 16 2022 14:42:04 EDT Ventricular Rate:  74 PR Interval:    QRS Duration: 114 QT Interval:  409 QTC Calculation: 454 R Axis:   -31 Text Interpretation: Atrial fibrillation Borderline IVCD with LAD Inferior infarct, old Anterior infarct, old Lateral leads are also involved Confirmed by Sherwood Gambler [phone]) on 04/16/2022 3:04:38 PM  Radiology CT Angio Chest PE W and/or Wo Contrast  Result Date: 04/16/2022 CLINICAL DATA:  Positive D-dimer. EXAM: CT ANGIOGRAPHY CHEST WITH CONTRAST TECHNIQUE: Multidetector CT imaging of the chest was performed using the standard protocol during bolus administration of intravenous contrast. Multiplanar CT image  reconstructions and MIPs were obtained to evaluate the vascular anatomy. RADIATION DOSE REDUCTION: This exam was performed according to the departmental dose-optimization program which includes automated exposure control, adjustment of the mA and/or kV according to patient size and/or use of iterative reconstruction technique. CONTRAST:  13mL OMNIPAQUE IOHEXOL 350 MG/ML SOLN COMPARISON:  None Available. FINDINGS: Cardiovascular: Aorta is normal in size. The heart is mildly enlarged. There is adequate opacification of the pulmonary arteries. Pulmonary emboli are seen within segmental and subsegmental branches of the right lower lobe. Mediastinum/Nodes: No enlarged mediastinal, hilar, or axillary lymph nodes. Thyroid gland, trachea, and  esophagus demonstrate no significant findings. There are calcified subcarinal lymph nodes likely related to old granulomatous disease. Lungs/Pleura: There are minimal patchy airspace and ground-glass opacities in the inferior right lower lobe. There is some secretions within the right lower lobe bronchus. There is a trace right pleural effusion. There is a 10 mm pleural-based nodule in the left upper lobe image 7/23. Upper Abdomen: Cholecystectomy clips are present. Musculoskeletal: No chest wall abnormality. No acute or significant osseous findings. Review of the MIP images confirms the above findings. IMPRESSION: 1. Right lower lobe segmental and subsegmental pulmonary emboli. Positive for acute PE with CTevidence of right heart strain (RV/LV Ratio = 1.1) consistent with at least submassive (intermediate risk) PE. The presence of right heart strain has been associated with an increased risk of morbidity and mortality. 2. There secretions in the right lower lobe bronchus which may be related to aspiration. 3. Minimal ground-glass and patchy airspace opacities in the right lower lobe may represent pulmonary infarcts and/or infection including aspiration pneumonia given the above  findings. 4. Cardiomegaly. Electronically Signed   By: Ronney Asters M.D.   On: 04/16/2022 20:54   CT Head Wo Contrast  Result Date: 04/16/2022 CLINICAL DATA:  Mental status changes of unknown EXAM: CT HEAD WITHOUT CONTRAST TECHNIQUE: Contiguous axial images were obtained from the base of the skull through the vertex without intravenous contrast. RADIATION DOSE REDUCTION: This exam was performed according to the departmental dose-optimization program which includes automated exposure control, adjustment of the mA and/or kV according to patient size and/or use of iterative reconstruction technique. COMPARISON:  01/20/2022 FINDINGS: Brain: Generalized atrophy. Normal ventricular morphology. No midline shift or mass effect. Small vessel chronic ischemic changes of deep cerebral white matter. Old inferior RIGHT cerebellar and RIGHT parieto-occipital infarcts. Old inferior LEFT frontal and LEFT occipital infarcts. No intracranial hemorrhage, mass lesion, or evidence of acute infarction. No extra-axial fluid collections Vascular: No hyperdense vessels. Atherosclerotic calcifications of internal carotid arteries at skull base. Skull: Intact Cause Sinuses/Orbits: Clear Other: N/A IMPRESSION: Atrophy with small vessel chronic ischemic changes of deep cerebral white matter. Multiple old infarcts as above. No acute intracranial abnormalities. Electronically Signed   By: Lavonia Dana M.D.   On: 04/16/2022 15:54   DG Chest Portable 1 View  Result Date: 04/16/2022 CLINICAL DATA:  Weakness and altered mental status. EXAM: PORTABLE CHEST 1 VIEW COMPARISON:  01/20/2022 and prior radiographs FINDINGS: The cardiomediastinal silhouette is unremarkable. Elevated RIGHT hemidiaphragm and RIGHT basilar atelectasis/scarring again noted. There is no evidence of focal airspace disease, pulmonary edema, suspicious pulmonary nodule/mass, pleural effusion, or pneumothorax. No acute bony abnormalities are identified. IMPRESSION: No  evidence of acute cardiopulmonary disease. Chronic RIGHT basilar atelectasis/scarring. Electronically Signed   By: Margarette Canada M.D.   On: 04/16/2022 14:33    Procedures .Critical Care  Performed by: Sherwood Gambler, MD Authorized by: Sherwood Gambler, MD   Critical care provider statement:    Critical care time (minutes):  30   Critical care time was exclusive of:  Separately billable procedures and treating other patients   Critical care was necessary to treat or prevent imminent or life-threatening deterioration of the following conditions:  Circulatory failure, cardiac failure and respiratory failure   Critical care was time spent personally by me on the following activities:  Development of treatment plan with patient or surrogate, discussions with consultants, evaluation of patient's response to treatment, examination of patient, ordering and review of laboratory studies, ordering and review of radiographic studies, ordering and performing  treatments and interventions, pulse oximetry, re-evaluation of patient's condition and review of old charts     Medications Ordered in ED Medications  0.9 %  sodium chloride infusion ( Intravenous New Bag/Given 04/16/22 2134)  Ampicillin-Sulbactam (UNASYN) 3 g in sodium chloride 0.9 % 100 mL IVPB (3 g Intravenous New Bag/Given 04/16/22 2250)  azithromycin (ZITHROMAX) 500 mg in sodium chloride 0.9 % 250 mL IVPB (has no administration in time range)  heparin ADULT infusion 100 units/mL (25000 units/272mL) (1,450 Units/hr Intravenous New Bag/Given 04/16/22 2255)  lactated ringers bolus 500 mL (0 mLs Intravenous Stopped 04/16/22 2105)  methylPREDNISolone sodium succinate (SOLU-MEDROL) 40 mg/mL injection 40 mg (40 mg Intravenous Given 04/16/22 1635)  diphenhydrAMINE (BENADRYL) capsule 25 mg ( Oral See Alternative 04/16/22 1934)    Or  diphenhydrAMINE (BENADRYL) injection 25 mg (25 mg Intravenous Given 04/16/22 1934)  iohexol (OMNIPAQUE) 350 MG/ML injection 75 mL  (75 mLs Intravenous Contrast Given 04/16/22 2025)  heparin bolus via infusion 4,000 Units (4,000 Units Intravenous Bolus from Bag 04/16/22 2256)    ED Course/ Medical Decision Making/ A&P Clinical Course as of 04/16/22 2306  Sun Apr 16, 2022  1540 Patient's D-dimer is quite elevated.  Given his immobility and tachypnea I think PE needs to be ruled out.  He has an allergy list in the chart of a "dye allergy" from over 20 years ago.  His wife is not sure what that was in relation to.  Unclear if he really had an allergy.  However we will go to the protocol, especially since he is otherwise stable. [SG]    Clinical Course User Index [SG] Sherwood Gambler, MD                           Medical Decision Making Amount and/or Complexity of Data Reviewed Independent Historian: spouse Labs: ordered.    Details: Normal WBC, chronic anemia. No AKI or significant change from baseline in [facility] Radiology: ordered and independent interpretation performed.    Details: CXR without pneumonia CT head - no head bleed ECG/medicine tests: independent interpretation performed.    Details: No acute ischemia  Risk Prescription drug management. Decision regarding hospitalization.   Patient's work-up ultimately shows acute PE.  There is concern for right heart strain on the CT as well.  He is otherwise hemodynamically stable.  There is also some question of pneumonia for which she is being treated.  He will be started on heparin as he has no significant history of bleeding.  Discussed with Dr. Josephine Cables we will also start Unasyn and azithromycin for possible aspiration pneumonia.  He will be admitted to the hospitalist service.        Final Clinical Impression(s) / ED Diagnoses Final diagnoses:  Acute pulmonary embolism, unspecified pulmonary embolism type, unspecified whether acute cor pulmonale present [facility])    Rx / DC Orders ED Discharge Orders     None         Sherwood Gambler, MD 04/16/22  2307

## 2022-04-16 NOTE — H&P (Signed)
History and Physical    Patient: Bernard Ramirez HQI:[id number] DOB: 01/18/1931 DOA: 04/16/2022 DOS: the patient was seen and examined on 04/16/2022 PCP: [name], MD  Patient coming from: Home  Chief Complaint:  Chief Complaint  Patient presents with   Weakness   HPI: Bernard Ramirez is a 88 y.o. male with medical history significant of hypertension,  CVA, A-fib not on anticoagulation, hypertension, GERD, hyperlipidemia who presents to the emergency department via EMS due to increased work of breathing within the last 2 days.  Patient became nonverbal within the last 2 weeks per wife at bedside, she was unable to provide history, most of the history was obtained from ED physician and wife at bedside.  Per wife, patient had a stroke in April (admitted from 4/4 to 4/10 at [facility]).  He was discharged to a SNF for rehabilitation, however, wife took patient out of the facility after about 6 weeks and took him home.  Patient was bedbound and was dependent on wife for ADLs including feeding.  He had pneumonia prior to being discharged from the SNF and was treated with Augmentin by his physician.  He has had cough with occasional production of clear thick phlegm for about a week.  Intermittent increased work of breathing was noted within last 2 days and this worsened this morning, so EMS was activated and patient was sent to the ED for further evaluation and management.  Of note, patient has chronic right arm weakness compared to left and bilateral lower extremity weakness which he could barely move.  ED Course:  In the emergency department, he was tachypneic, but other vital signs were within normal range.  Work-up in the ED shows normocytic anemia and normal BMP except for hyperglycemia.  Albumin 2.7, ammonia 31, troponin x115, D-dimer 3.17.  SARS coronavirus 2 was negative.  Blood culture pending. CT angiography of the chest with contrast showed Right lower lobe segmental and subsegmental  pulmonary emboli. Positive for acute PE with CTevidence of right heart strain (RV/LV Ratio = 1.1) consistent with at least submassive (intermediate risk) PE. 2. There secretions in the right lower lobe bronchus which may be related to aspiration. 3. Minimal ground-glass and patchy airspace opacities in the right lower lobe may represent pulmonary infarcts and/or infection including aspiration pneumonia given the above findings. 4. Cardiomegaly.  CT head without contrast showed no acute intracranial abnormalities Chest x-ray showed no evidence of acute cardiopulmonary disease.  Chronic right basilar atelectasis/scarring Patient was treated with Benadryl, Solu-Medrol, IV hydration was provided and patient was started on heparin drip.  IV antibiotics was started due to suspicion for aspiration pneumonia. Hospitalist was asked to admit patient for further evaluation and management.  Review of Systems: Review of systems as noted in the HPI. All other systems reviewed and are negative.   Past Medical History:  Diagnosis Date   AKI (acute kidney injury) ([place]) 10/08/2016   Anemia    04/2012: H&H-10.2/31.5, MCV-77; 06/2012:12/38.9   Arthritis    Benign prostatic hypertrophy    s/p transurethral resection of the prostate   Cellulitis 02/01/2021   Chest pain    2004; with dyspnea, stress nuclear-normal EF + questionable inferior wall ischemia, normal coronary angiography;  2010-negative stress nuclear   CVA (cerebral infarction)    asymptomatic (seen on CT)   Diarrhea    h/o Hemoccult-positive stool   Dysrhythmia    Edema of both legs    GERD (gastroesophageal reflux disease)    History of  blood transfusion    History of kidney stones    History of urinary tract infection    Hypertension    Jerking movements of extremities    left arm    Low back pain    Obstructive sleep apnea    pt states can not use the CPAP   Peripheral neuropathy    lower legs and feet bilat    Persistent atrial  fibrillation ([place]) 2013   Asymptomatic; diagnosed in 05/2012   Pneumonia    Prostate cancer ([place]) 2017   stage 4   Shortness of breath dyspnea    pt states only develops SOB if tries to do something too quickly   Upper GI bleed 1985   1985-peptic ulcer disease; initial hemigastrectomy; subsequent Billroth II   Urinary hesitancy    Vertigo    ED evaluation-06/2012   Past Surgical History:  Procedure Laterality Date   Billroth II     CHOLECYSTECTOMY     COLONOSCOPY  Approximately 2000   COLONOSCOPY  10/2012   Colonoscopy March 2014 at [facility], difficulty exam requiring fluoroscopy and overtube. Patient developed severe bradycardia again during his procedure like he did [facility]. Multiple polyps removed which were tubular adenomas. Previously tattooed sites at 20 and 30 cm were free of any polypoid change. Left-sided diverticulosis noted.   COLONOSCOPY WITH ESOPHAGOGASTRODUODENOSCOPY (EGD)  06-17-2009   XNA:[id number] tubular adenomas and 2 tubulovillous adenomas, incomplete, 3 clips placed   ESOPHAGOGASTRODUODENOSCOPY  06/17/2009   SLF: normal/chronic gastritis (non-h pylori)   ESOPHAGOGASTRODUODENOSCOPY N/A 05/11/2015   URK:YHCWCBJ anemai due to postgastrectomy state/moderate erosive gastritis   INGUINAL HERNIA REPAIR     Left   ROTATOR CUFF REPAIR  1991   Bilateral   TRANSURETHRAL RESECTION OF PROSTATE  06/2016   TRANSURETHRAL RESECTION OF PROSTATE N/A 12/27/2012   Procedure: TRANSURETHRAL RESECTION OF THE PROSTATE (TURP);  Surgeon: Marissa Nestle, MD;  Location: [facility];  Service: Urology;  Laterality: N/A;   TRANSURETHRAL RESECTION OF PROSTATE N/A 04/11/2016   Procedure: TRANSURETHRAL RESECTION OF THE PROSTATE (TURP);  Surgeon: Irine Seal, MD;  Location: [facility];  Service: Urology;  Laterality: N/A;   VAGOTOMY AND PYLOROPLASTY  1970s   Details of procedure uncertain; 6283T    Social History:  reports that he quit smoking about 18 years ago. His smoking use included cigars.  He has never used smokeless tobacco. He reports that he does not drink alcohol and does not use drugs.   Allergies  Allergen Reactions   Contrast Media [Iodinated Contrast Media]     Pt states he was given "dye" 20 yrs ago, anaphylaxis & syncope - was told to never have it again    Gabapentin Itching   Aspirin Other (See Comments)    Blistering    Latex Rash   Lipitor [Atorvastatin] Itching   Tape Rash    EKG leads    Family History  Problem Relation Age of Onset   Diabetes Mellitus II Mother    Allergic rhinitis Mother    Lung disease Father    Allergic rhinitis Father    Angioedema Neg Hx    Asthma Neg Hx    Atopy Neg Hx    Eczema Neg Hx    Immunodeficiency Neg Hx    Urticaria Neg Hx    Cystic fibrosis Neg Hx    Lupus Neg Hx    Emphysema Neg Hx    Migraines Neg Hx      Prior to Admission medications  Medication Sig Start Date End Date Taking? Authorizing Provider  clopidogrel (PLAVIX) 75 MG tablet TAKE 1 TABLET(75 MG) BY MOUTH DAILY 04/06/22   [name], MD  Cyanocobalamin (B-12 COMPLIANCE INJECTION) 1000 MCG/ML KIT Inject 1,000 mcg as directed every 30 (thirty) days.    [provider]  folic acid (FOLVITE) 1 MG tablet Take 1 mg by mouth daily.    [provider]  gabapentin (NEURONTIN) 300 MG capsule Take 1 capsule (300 mg total) by mouth 3 (three) times daily as needed (nerve pain). 03/17/22   [name], MD  HYDROcodone-acetaminophen (NORCO) 10-325 MG tablet Take 1 tablet by mouth every 6 (six) hours as needed. 04/13/22   [name], MD  [employer] [facility]) OINT Apply 1 application. topically 2 (two) times daily. 01/14/22   [provider]  losartan (COZAAR) 100 MG tablet Take 1 tablet (100 mg total) by mouth daily. 03/07/22   [name], MD  pantoprazole (PROTONIX) 40 MG tablet Take 1 tablet (40 mg total) by mouth daily. 03/07/22   [name], MD  pravastatin (PRAVACHOL) 20 MG tablet Take 1  tablet (20 mg total) by mouth daily. 03/07/22   [name], MD    Physical Exam: BP (!) 143/94 (BP Location: Right Arm)   Pulse 85   Temp (!) 97.3 F (36.3 C) (Oral)   Resp (!) 23   Ht 6' (1.829 m)   Wt 89.4 kg   SpO2 96%   BMI 26.72 kg/m   General: 88 y.o. year-old male ill appearing, but in no acute distress.   HEENT: NCAT, EOMI Neck: Supple, trachea medial Cardiovascular: Regular rate and rhythm with no rubs or gallops.  No thyromegaly or JVD noted.  No lower extremity edema. 2/4 pulses in all 4 extremities. Respiratory: Tachypnea.  Increased breath sounds with rhonchi in RLL.   Abdomen: Soft, nontender nondistended with normal bowel sounds x4 quadrants. Muskuloskeletal: No cyanosis, clubbing or edema noted bilaterally Neuro: Patient was nonverbal at bedside.  Sensation, reflexes intact Skin: No ulcerative lesions noted or rashes Psychiatry: Mood is appropriate for condition and setting          Labs on Admission:  Basic Metabolic Panel: Recent Labs  Lab 04/16/22 1418  NA 139  K 4.1  CL 102  CO2 29  GLUCOSE 189*  BUN 14  CREATININE 0.64  CALCIUM 9.1   Liver Function Tests: Recent Labs  Lab 04/16/22 1418  AST 18  ALT 14  ALKPHOS 96  BILITOT 0.7  PROT 6.4*  ALBUMIN 2.7*   No results for input(s): "LIPASE", "AMYLASE" in the last 168 hours. Recent Labs  Lab 04/16/22 1540  AMMONIA 31   CBC: Recent Labs  Lab 04/16/22 1418  WBC 7.1  NEUTROABS 4.8  HGB 12.2*  HCT 41.1  MCV 84.0  PLT 245   Cardiac Enzymes: No results for input(s): "CKTOTAL", "CKMB", "CKMBINDEX", "TROPONINI" in the last 168 hours.  BNP (last 3 results) No results for input(s): "BNP" in the last 8760 hours.  ProBNP (last 3 results) No results for input(s): "PROBNP" in the last 8760 hours.  CBG: No results for input(s): "GLUCAP" in the last 168 hours.  Radiological Exams on Admission: CT Angio Chest PE W and/or Wo Contrast  Result Date: 04/16/2022 CLINICAL DATA:   Positive D-dimer. EXAM: CT ANGIOGRAPHY CHEST WITH CONTRAST TECHNIQUE: Multidetector CT imaging of the chest was performed using the standard protocol during bolus administration of intravenous contrast. Multiplanar CT image  reconstructions and MIPs were obtained to evaluate the vascular anatomy. RADIATION DOSE REDUCTION: This exam was performed according to the departmental dose-optimization program which includes automated exposure control, adjustment of the mA and/or kV according to patient size and/or use of iterative reconstruction technique. CONTRAST:  47mL OMNIPAQUE IOHEXOL 350 MG/ML SOLN COMPARISON:  None Available. FINDINGS: Cardiovascular: Aorta is normal in size. The heart is mildly enlarged. There is adequate opacification of the pulmonary arteries. Pulmonary emboli are seen within segmental and subsegmental branches of the right lower lobe. Mediastinum/Nodes: No enlarged mediastinal, hilar, or axillary lymph nodes. Thyroid gland, trachea, and esophagus demonstrate no significant findings. There are calcified subcarinal lymph nodes likely related to old granulomatous disease. Lungs/Pleura: There are minimal patchy airspace and ground-glass opacities in the inferior right lower lobe. There is some secretions within the right lower lobe bronchus. There is a trace right pleural effusion. There is a 10 mm pleural-based nodule in the left upper lobe image 7/23. Upper Abdomen: Cholecystectomy clips are present. Musculoskeletal: No chest wall abnormality. No acute or significant osseous findings. Review of the MIP images confirms the above findings. IMPRESSION: 1. Right lower lobe segmental and subsegmental pulmonary emboli. Positive for acute PE with CTevidence of right heart strain (RV/LV Ratio = 1.1) consistent with at least submassive (intermediate risk) PE. The presence of right heart strain has been associated with an increased risk of morbidity and mortality. 2. There secretions in the right lower lobe  bronchus which may be related to aspiration. 3. Minimal ground-glass and patchy airspace opacities in the right lower lobe may represent pulmonary infarcts and/or infection including aspiration pneumonia given the above findings. 4. Cardiomegaly. Electronically Signed   By: Darliss Cheney M.D.   On: 04/16/2022 20:54   CT Head Wo Contrast  Result Date: 04/16/2022 CLINICAL DATA:  Mental status changes of unknown EXAM: CT HEAD WITHOUT CONTRAST TECHNIQUE: Contiguous axial images were obtained from the base of the skull through the vertex without intravenous contrast. RADIATION DOSE REDUCTION: This exam was performed according to the departmental dose-optimization program which includes automated exposure control, adjustment of the mA and/or kV according to patient size and/or use of iterative reconstruction technique. COMPARISON:  01/20/2022 FINDINGS: Brain: Generalized atrophy. Normal ventricular morphology. No midline shift or mass effect. Small vessel chronic ischemic changes of deep cerebral white matter. Old inferior RIGHT cerebellar and RIGHT parieto-occipital infarcts. Old inferior LEFT frontal and LEFT occipital infarcts. No intracranial hemorrhage, mass lesion, or evidence of acute infarction. No extra-axial fluid collections Vascular: No hyperdense vessels. Atherosclerotic calcifications of internal carotid arteries at skull base. Skull: Intact Cause Sinuses/Orbits: Clear Other: N/A IMPRESSION: Atrophy with small vessel chronic ischemic changes of deep cerebral white matter. Multiple old infarcts as above. No acute intracranial abnormalities. Electronically Signed   By: Ulyses Southward M.D.   On: 04/16/2022 15:54   DG Chest Portable 1 View  Result Date: 04/16/2022 CLINICAL DATA:  Weakness and altered mental status. EXAM: PORTABLE CHEST 1 VIEW COMPARISON:  01/20/2022 and prior radiographs FINDINGS: The cardiomediastinal silhouette is unremarkable. Elevated RIGHT hemidiaphragm and RIGHT basilar  atelectasis/scarring again noted. There is no evidence of focal airspace disease, pulmonary edema, suspicious pulmonary nodule/mass, pleural effusion, or pneumothorax. No acute bony abnormalities are identified. IMPRESSION: No evidence of acute cardiopulmonary disease. Chronic RIGHT basilar atelectasis/scarring. Electronically Signed   By: Harmon Pier M.D.   On: 04/16/2022 14:33    EKG: I independently viewed the EKG done and my findings are as followed: A-fib with rate control  Assessment/Plan Present on Admission:  Acute pulmonary embolism (HCC)  Atrial fibrillation by electrocardiogram (HCC)  GERD without esophagitis  Principal Problem:   Acute pulmonary embolism (HCC) Active Problems:   Hyperglycemia   Atrial fibrillation by electrocardiogram (HCC)   GERD without esophagitis   Mixed hyperlipidemia   CVA (cerebral vascular accident) ([place])   Aspiration pneumonia ([place])   Hypoalbuminemia due to protein-calorie malnutrition ([place])   Prediabetes  Acute pulmonary embolism Patient presented with increasing shortness of breath CT angiography chest with contrast showed right lower lobe segmental and subsegmental pulmonary emboli with right heart strain Patient was started on IV heparin drip with plan to transition to DOAC in the morning Bilateral lower extremity ultrasound will be done in the morning Echocardiogram will be done in the morning  Possible aspiration pneumonia Patient was started on Unasyn and Zithromax, we shall continue same at this time with plan to de-escalate/discontinue based on blood culture, sputum culture, urine Legionella, strep pneumo and procalcitonin Continue incentive spirometry, flutter valve Continue aspiration precaution Continue bedside swallow eval Continue SLP eval and treat in the morning  Hypoalbuminemia possibly secondary to moderate protein calorie malnutrition Albumin 2.7, consider protein supplements after SLP eval recommendation  Hyperglycemia  due to prediabetes CBG 189, hemoglobin A1c about 4 months ago was 6.3 Continue to monitor blood glucose level with morning labs Repeat hemoglobin A1c and treat accordingly  Essential hypertension P.o. meds will be temporarily held at this time pending SLP eval Continue IV hydralazine 10 mg every 6 hours as needed for SBP > 170  Mixed hyperlipidemia Temporarily hold statin pending SLP eval and recommendations  GERD Continue IV Protonix 40 mg daily  Chronic atrial fibrillation CHA2DS2-VASc score of at least 6 Patient was not on any rate control or anticoagulant He was only on Plavix Continue IV heparin drip.  CVA Due to suspicion for aspiration pneumonia Plavix and statin temporarily held Resume meds based on SLP eval in the morning  DVT prophylaxis: Heparin drip  Code Status: DNR  Consults: None  Family Communication: Wife at bedside  Severity of Illness: The appropriate patient status for this patient is INPATIENT. Inpatient status is judged to be reasonable and necessary in order to provide the required intensity of service to ensure the patient's safety. The patient's presenting symptoms, physical exam findings, and initial radiographic and laboratory data in the context of their chronic comorbidities is felt to place them at high risk for further clinical deterioration. Furthermore, it is not anticipated that the patient will be medically stable for discharge from the hospital within 2 midnights of admission.   * I certify that at the point of admission it is my clinical judgment that the patient will require inpatient hospital care spanning beyond 2 midnights from the point of admission due to high intensity of service, high risk for further deterioration and high frequency of surveillance required.*  Author: Bernadette Hoit, DO 04/16/2022 11:52 PM  For on call review [email or web address].

## 2022-04-16 NOTE — Progress Notes (Signed)
ANTICOAGULATION CONSULT NOTE - Initial Consult  Pharmacy Consult for Heparin Indication: pulmonary embolus  Allergies  Allergen Reactions   Contrast Media [Iodinated Contrast Media]     Pt states he was given "dye" 20 yrs ago, anaphylaxis & syncope - was told to never have it again    Gabapentin Itching   Aspirin Other (See Comments)    Blistering    Latex Rash   Lipitor [Atorvastatin] Itching   Tape Rash    EKG leads    Patient Measurements: Height: 6' (182.9 cm) Weight: 89.4 kg (197 lb) IBW/kg (Calculated) : 77.6 Heparin Dosing Weight: 89.4 kg  Vital Signs: Temp: 98 F (36.7 C) (08/27 1600) Temp Source: Axillary (08/27 1600) BP: 127/95 (08/27 2115) Pulse Rate: 78 (08/27 2115)  Labs: Recent Labs    04/16/22 1418 04/16/22 1540  HGB 12.2*  --   HCT 41.1  --   PLT 245  --   CREATININE 0.64  --   TROPONINIHS  --  15    Estimated Creatinine Clearance: 68.7 mL/min (by C-G formula based on SCr of 0.64 mg/dL).   Medical History: Past Medical History:  Diagnosis Date   AKI (acute kidney injury) ([place]) 10/08/2016   Anemia    04/2012: H&H-10.2/31.5, MCV-77; 06/2012:12/38.9   Arthritis    Benign prostatic hypertrophy    s/p transurethral resection of the prostate   Cellulitis 02/01/2021   Chest pain    2004; with dyspnea, stress nuclear-normal EF + questionable inferior wall ischemia, normal coronary angiography;  2010-negative stress nuclear   CVA (cerebral infarction)    asymptomatic (seen on CT)   Diarrhea    h/o Hemoccult-positive stool   Dysrhythmia    Edema of both legs    GERD (gastroesophageal reflux disease)    History of blood transfusion    History of kidney stones    History of urinary tract infection    Hypertension    Jerking movements of extremities    left arm    Low back pain    Obstructive sleep apnea    pt states can not use the CPAP   Peripheral neuropathy    lower legs and feet bilat    Persistent atrial fibrillation ([place]) 2013    Asymptomatic; diagnosed in 05/2012   Pneumonia    Prostate cancer ([place]) 2017   stage 4   Shortness of breath dyspnea    pt states only develops SOB if tries to do something too quickly   Upper GI bleed 1985   1985-peptic ulcer disease; initial hemigastrectomy; subsequent Billroth II   Urinary hesitancy    Vertigo    ED evaluation-06/2012     Assessment: 88 yr old male to begin IV heparin for pulmonary embolus with right heart strain per CTA.  Hx CVA 11/2021, on Plavix prior to admission. Hx atrial fibrillation, not on anticoagulation PTA. Head CT negative for acute event.  Goal of Therapy:  Heparin level 0.3-0.7 units/ml Monitor platelets by anticoagulation protocol: Yes   Plan:  Heparin 4000 units IV x 1 Heparin drip to begin at 1450 units/hr Heparin level and CBC ~8 hours after drip begins. Daily heparin level and CBC while on heparin.  Arty Baumgartner, RPh 04/16/2022,9:45 PM

## 2022-04-16 NOTE — ED Triage Notes (Signed)
Patient brought in via ems from home. Patient wife states he had a stroke in April. Patient wife states over the past few weeks he has poor intake and getting weaker. Patient not able to walk or communicate well at baseline

## 2022-04-17 ENCOUNTER — Inpatient Hospital Stay (HOSPITAL_COMMUNITY): Payer: PPO

## 2022-04-17 ENCOUNTER — Other Ambulatory Visit (HOSPITAL_COMMUNITY): Payer: Self-pay | Admitting: *Deleted

## 2022-04-17 ENCOUNTER — Telehealth: Payer: Self-pay | Admitting: Family Medicine

## 2022-04-17 DIAGNOSIS — I2699 Other pulmonary embolism without acute cor pulmonale: Secondary | ICD-10-CM | POA: Diagnosis not present

## 2022-04-17 DIAGNOSIS — I2609 Other pulmonary embolism with acute cor pulmonale: Secondary | ICD-10-CM

## 2022-04-17 LAB — ECHOCARDIOGRAM COMPLETE
AR max vel: 1.57 cm2
AV Area VTI: 1.45 cm2
AV Area mean vel: 1.37 cm2
AV Mean grad: 8 mmHg
AV Peak grad: 13 mmHg
Ao pk vel: 1.8 m/s
Area-P 1/2: 4.31 cm2
Height: 72 in
S' Lateral: 2.9 cm
Weight: 3152 oz

## 2022-04-17 LAB — MAGNESIUM: Magnesium: 2.1 mg/dL (ref 1.7–2.4)

## 2022-04-17 LAB — COMPREHENSIVE METABOLIC PANEL
ALT: 14 U/L (ref 0–44)
AST: 20 U/L (ref 15–41)
Albumin: 2.8 g/dL — ABNORMAL LOW (ref 3.5–5.0)
Alkaline Phosphatase: 98 U/L (ref 38–126)
Anion gap: 10 (ref 5–15)
BUN: 16 mg/dL (ref 8–23)
CO2: 27 mmol/L (ref 22–32)
Calcium: 9.1 mg/dL (ref 8.9–10.3)
Chloride: 103 mmol/L (ref 98–111)
Creatinine, Ser: 0.63 mg/dL (ref 0.61–1.24)
GFR, Estimated: >60 mL/min (ref >60)
Glucose, Bld: 127 mg/dL — ABNORMAL HIGH (ref 70–99)
Potassium: 4.3 mmol/L (ref 3.5–5.1)
Sodium: 140 mmol/L (ref 135–145)
Total Bilirubin: 0.8 mg/dL (ref 0.3–1.2)
Total Protein: 6.7 g/dL (ref 6.5–8.1)

## 2022-04-17 LAB — PROCALCITONIN: Procalcitonin: <0.1 ng/mL

## 2022-04-17 LAB — CBC
HCT: 40.9 % (ref 39.0–52.0)
Hemoglobin: 12.4 g/dL — ABNORMAL LOW (ref 13.0–17.0)
MCH: 25.1 pg — ABNORMAL LOW (ref 26.0–34.0)
MCHC: 30.3 g/dL (ref 30.0–36.0)
MCV: 82.6 fL (ref 80.0–100.0)
Platelets: 248 10*3/uL (ref 150–400)
RBC: 4.95 MIL/uL (ref 4.22–5.81)
RDW: 14.8 % (ref 11.5–15.5)
WBC: 4.8 10*3/uL (ref 4.0–10.5)
nRBC: 0 % (ref 0.0–0.2)

## 2022-04-17 LAB — HEMOGLOBIN A1C
Hgb A1c MFr Bld: 6.1 % — ABNORMAL HIGH (ref 4.8–5.6)
Mean Plasma Glucose: 128.37 mg/dL

## 2022-04-17 LAB — APTT: aPTT: 55 seconds — ABNORMAL HIGH (ref 24–36)

## 2022-04-17 LAB — HEPARIN LEVEL (UNFRACTIONATED)
Heparin Unfractionated: 0.63 IU/mL (ref 0.30–0.70)
Heparin Unfractionated: 0.66 IU/mL (ref 0.30–0.70)

## 2022-04-17 LAB — PHOSPHORUS: Phosphorus: 3 mg/dL (ref 2.5–4.6)

## 2022-04-17 MED ORDER — ORAL CARE MOUTH RINSE: 15.0000 mL | OROMUCOSAL | Status: DC | PRN

## 2022-04-17 MED ORDER — ORAL CARE MOUTH RINSE
15.0000 mL | OROMUCOSAL | Status: DC
  Administered 2022-04-18 – 2022-04-19 (×5): 15 mL via OROMUCOSAL

## 2022-04-17 MED ORDER — PERFLUTREN LIPID MICROSPHERE
1.0000 mL | INTRAVENOUS | Status: AC | PRN
  Administered 2022-04-17: 2 mL via INTRAVENOUS

## 2022-04-17 MED ORDER — ENSURE ENLIVE PO LIQD
237.0000 mL | Freq: Two times a day (BID) | ORAL | Status: DC
  Administered 2022-04-17 – 2022-04-18 (×2): 237 mL via ORAL

## 2022-04-17 MED ORDER — SODIUM CHLORIDE 0.9 % IV SOLN
3.0000 g | Freq: Four times a day (QID) | INTRAVENOUS | Status: DC
  Administered 2022-04-17 – 2022-04-19 (×8): 3 g via INTRAVENOUS
  Filled 2022-04-17 (×8): qty 8

## 2022-04-17 NOTE — Progress Notes (Signed)
[place] for Heparin Indication: pulmonary embolus  Allergies  Allergen Reactions   Contrast Media [Iodinated Contrast Media]     Pt states he was given "dye" 20 yrs ago, anaphylaxis & syncope - was told to never have it again    Gabapentin Itching   Aspirin Other (See Comments)    Blistering    Latex Rash   Lipitor [Atorvastatin] Itching   Tape Rash    EKG leads    Patient Measurements: Height: 6' (182.9 cm) Weight: 89.4 kg (197 lb) IBW/kg (Calculated) : 77.6 Heparin Dosing Weight: 89.4 kg  Vital Signs: Temp: 97.3 F (36.3 C) (08/28 0300) Temp Source: Oral (08/28 0300) BP: 132/86 (08/28 0530) Pulse Rate: 80 (08/28 0530)  Labs: Recent Labs    04/16/22 1418 04/16/22 1540 04/17/22 0619  HGB 12.2*  --  12.4*  HCT 41.1  --  40.9  PLT 245  --  248  HEPARINUNFRC  --   --  0.63  CREATININE 0.64  --   --   TROPONINIHS  --  15  --      Estimated Creatinine Clearance: 68.7 mL/min (by C-G formula based on SCr of 0.64 mg/dL).   Medical History: Past Medical History:  Diagnosis Date   AKI (acute kidney injury) ([place]) 10/08/2016   Anemia    04/2012: H&H-10.2/31.5, MCV-77; 06/2012:12/38.9   Arthritis    Benign prostatic hypertrophy    s/p transurethral resection of the prostate   Cellulitis 02/01/2021   Chest pain    2004; with dyspnea, stress nuclear-normal EF + questionable inferior wall ischemia, normal coronary angiography;  2010-negative stress nuclear   CVA (cerebral infarction)    asymptomatic (seen on CT)   Diarrhea    h/o Hemoccult-positive stool   Dysrhythmia    Edema of both legs    GERD (gastroesophageal reflux disease)    History of blood transfusion    History of kidney stones    History of urinary tract infection    Hypertension    Jerking movements of extremities    left arm    Low back pain    Obstructive sleep apnea    pt states can not use the CPAP   Peripheral neuropathy    lower legs and feet bilat     Persistent atrial fibrillation ([place]) 2013   Asymptomatic; diagnosed in 05/2012   Pneumonia    Prostate cancer ([place]) 2017   stage 4   Shortness of breath dyspnea    pt states only develops SOB if tries to do something too quickly   Upper GI bleed 1985   1985-peptic ulcer disease; initial hemigastrectomy; subsequent Billroth II   Urinary hesitancy    Vertigo    ED evaluation-06/2012     Assessment: 88 yr old male to begin IV heparin for pulmonary embolus with right heart strain per CTA.  Hx CVA 11/2021, on Plavix prior to admission. Hx atrial fibrillation, not on anticoagulation PTA. Head CT negative for acute event.  8/28 AM update:  Heparin level therapeutic  CBC stable  Goal of Therapy:  Heparin level 0.3-0.7 units/ml Monitor platelets by anticoagulation protocol: Yes   Plan:  Cont heparin at 1450 units/hr 1200 heparin level  Narda Bonds, PharmD, BCPS Clinical Pharmacist Phone: [phone]

## 2022-04-17 NOTE — Evaluation (Signed)
Clinical/Bedside Swallow Evaluation Patient Details  Name: Bernard Ramirez MRN: [id number] Date of Birth: [birth year]  Today's Date: 04/17/2022 Time: SLP Start Time (ACUTE ONLY): 1638 SLP Stop Time (ACUTE ONLY): 1700 SLP Time Calculation (min) (ACUTE ONLY): 22 min  Past Medical History:  Past Medical History:  Diagnosis Date   AKI (acute kidney injury) ([place]) 10/08/2016   Anemia    04/2012: H&H-10.2/31.5, MCV-77; 06/2012:12/38.9   Arthritis    Benign prostatic hypertrophy    s/p transurethral resection of the prostate   Cellulitis 02/01/2021   Chest pain    2004; with dyspnea, stress nuclear-normal EF + questionable inferior wall ischemia, normal coronary angiography;  2010-negative stress nuclear   CVA (cerebral infarction)    asymptomatic (seen on CT)   Diarrhea    h/o Hemoccult-positive stool   Dysrhythmia    Edema of both legs    GERD (gastroesophageal reflux disease)    History of blood transfusion    History of kidney stones    History of urinary tract infection    Hypertension    Jerking movements of extremities    left arm    Low back pain    Obstructive sleep apnea    pt states can not use the CPAP   Peripheral neuropathy    lower legs and feet bilat    Persistent atrial fibrillation ([place]) 2013   Asymptomatic; diagnosed in 05/2012   Pneumonia    Prostate cancer ([place]) 2017   stage 4   Shortness of breath dyspnea    pt states only develops SOB if tries to do something too quickly   Upper GI bleed 1985   1985-peptic ulcer disease; initial hemigastrectomy; subsequent Billroth II   Urinary hesitancy    Vertigo    ED evaluation-06/2012   Past Surgical History:  Past Surgical History:  Procedure Laterality Date   Billroth II     CHOLECYSTECTOMY     COLONOSCOPY  Approximately 2000   COLONOSCOPY  10/2012   Colonoscopy March 2014 at [facility], difficulty exam requiring fluoroscopy and overtube. Patient developed severe bradycardia again during his procedure like he did  [facility]. Multiple polyps removed which were tubular adenomas. Previously tattooed sites at 20 and 30 cm were free of any polypoid change. Left-sided diverticulosis noted.   COLONOSCOPY WITH ESOPHAGOGASTRODUODENOSCOPY (EGD)  06-17-2009   AJO:INOMVEHM tubular adenomas and 2 tubulovillous adenomas, incomplete, 3 clips placed   ESOPHAGOGASTRODUODENOSCOPY  06/17/2009   SLF: normal/chronic gastritis (non-h pylori)   ESOPHAGOGASTRODUODENOSCOPY N/A 05/11/2015   CNO:[id number] anemai due to postgastrectomy state/moderate erosive gastritis   INGUINAL HERNIA REPAIR     Left   ROTATOR CUFF REPAIR  1991   Bilateral   TRANSURETHRAL RESECTION OF PROSTATE  06/2016   TRANSURETHRAL RESECTION OF PROSTATE N/A 12/27/2012   Procedure: TRANSURETHRAL RESECTION OF THE PROSTATE (TURP);  Surgeon: Marissa Nestle, MD;  Location: [facility];  Service: Urology;  Laterality: N/A;   TRANSURETHRAL RESECTION OF PROSTATE N/A 04/11/2016   Procedure: TRANSURETHRAL RESECTION OF THE PROSTATE (TURP);  Surgeon: Irine Seal, MD;  Location: [facility];  Service: Urology;  Laterality: N/A;   VAGOTOMY AND PYLOROPLASTY  1970s   Details of procedure uncertain; [id number]   HPI:  Bernard Ramirez is a 88 y.o. male with medical history significant of hypertension,  CVA, A-fib not on anticoagulation, hypertension, GERD, hyperlipidemia who presents to the emergency department via EMS due to increased work of breathing within the last 2 days.  Patient became nonverbal within the last  2 weeks per wife at bedside, she was unable to provide history, most of the history was obtained from ED physician and wife at bedside.  Per wife, patient had a stroke in April (admitted from 4/4 to 4/10 at [facility]).  He was discharged to a SNF for rehabilitation, however, wife took patient out of the facility after about 6 weeks and took him home.  Patient was bedbound and was dependent on wife for ADLs including feeding.  He had pneumonia prior to being discharged from  the SNF and was treated with Augmentin by his physician.  He has had cough with occasional production of clear thick phlegm for about a week.  Intermittent increased work of breathing was noted within last 2 days and this worsened this morning, so EMS was activated and patient was sent to the ED for further evaluation and management.  Of note, patient has chronic right arm weakness compared to left and bilateral lower extremity weakness which he could barely move. Pt now with PE and suspected aspiration PNA. BSE requested.    Assessment / Plan / Recommendation  Clinical Impression  Clinical swallowing evaluation completed while Pt was sitting upright in bed. Pt consumed thin liquids with s/sx of oropharyngeal dysphagia including inconsistent immediate and delayed coughing with thin liquids. Note no overt coughing with NTL. Pt with poor coordination and prolonged prep with regular textures. Pt tolerated puree textures and very soft textures without incident. Recommend initiate D2/fine chop diet and NECTAR thick liquids. Secondary to risk factors (also note reports of PNA since CVA in April 2023) and overt s/sx of oropharyngeal dyspahgia recommend consider objective assessment. ST will continue to follow, Thank you SLP Visit Diagnosis: Dysphagia, unspecified (R13.10)    Aspiration Risk  Mild aspiration risk;Moderate aspiration risk    Diet Recommendation Dysphagia 2 (Fine chop);Nectar-thick liquid   Liquid Administration via: Cup Medication Administration: Crushed with puree Supervision: Patient able to self feed Compensations: Minimize environmental distractions;Slow rate Postural Changes: Seated upright at 90 degrees    Other  Recommendations Oral Care Recommendations: Oral care BID    Recommendations for follow up therapy are one component of a multi-disciplinary discharge planning process, led by the attending physician.  Recommendations may be updated based on patient status, additional  functional criteria and insurance authorization.           Frequency and Duration min 2x/week  1 week       Prognosis Prognosis for Safe Diet Advancement: Fair      Swallow Study   General Date of Onset: 04/16/22 HPI: Bernard Ramirez is a 88 y.o. male with medical history significant of hypertension,  CVA, A-fib not on anticoagulation, hypertension, GERD, hyperlipidemia who presents to the emergency department via EMS due to increased work of breathing within the last 2 days.  Patient became nonverbal within the last 2 weeks per wife at bedside, she was unable to provide history, most of the history was obtained from ED physician and wife at bedside.  Per wife, patient had a stroke in April (admitted from 4/4 to 4/10 at [facility]).  He was discharged to a SNF for rehabilitation, however, wife took patient out of the facility after about 6 weeks and took him home.  Patient was bedbound and was dependent on wife for ADLs including feeding.  He had pneumonia prior to being discharged from the SNF and was treated with Augmentin by his physician.  He has had cough with occasional production of clear thick phlegm  for about a week.  Intermittent increased work of breathing was noted within last 2 days and this worsened this morning, so EMS was activated and patient was sent to the ED for further evaluation and management.  Of note, patient has chronic right arm weakness compared to left and bilateral lower extremity weakness which he could barely move. Pt now with PE and suspected aspiration PNA. BSE requested. Type of Study: Bedside Swallow Evaluation Previous Swallow Assessment: none in chart Diet Prior to this Study: Dysphagia 1 (puree);Thin liquids Temperature Spikes Noted: No Respiratory Status: Room air History of Recent Intubation: No Behavior/Cognition: Alert;Cooperative;Pleasant mood Oral Cavity Assessment: Within Functional Limits Oral Care Completed by SLP: Recent completion by  staff Oral Cavity - Dentition: Dentures, top;Dentures, bottom Vision: Functional for self-feeding Self-Feeding Abilities: Able to feed self Patient Positioning: Upright in bed Baseline Vocal Quality: Normal Volitional Cough: Strong Volitional Swallow: Able to elicit    Oral/Motor/Sensory Function Overall Oral Motor/Sensory Function: Within functional limits   Ice Chips Ice chips: Impaired Presentation: Spoon Oral Phase Functional Implications: Prolonged oral transit Pharyngeal Phase Impairments: Cough - Delayed;Multiple swallows   Thin Liquid Thin Liquid: Impaired Presentation: Cup;Straw Oral Phase Functional Implications: Prolonged oral transit Pharyngeal  Phase Impairments: Throat Clearing - Delayed;Cough - Immediate    Nectar Thick Nectar Thick Liquid: Within functional limits Presentation: Cup   Honey Thick Honey Thick Liquid: Not tested   Puree Puree: Within functional limits   Solid     Solid: Impaired Presentation: Spoon Oral Phase Impairments: Impaired mastication;Reduced lingual movement/coordination Oral Phase Functional Implications: Prolonged oral transit Pharyngeal Phase Impairments: Suspected delayed Swallow     Jove Beyl H. Roddie Mc, CCC-SLP Speech Language Pathologist  Wende Bushy 04/17/2022,5:01 PM

## 2022-04-17 NOTE — TOC Progression Note (Signed)
  Transition of Care [facility]) Screening Note   Patient Details  Name: Bernard Ramirez Date of Birth: May 20, 1931   Transition of Care [facility]) CM/SW Contact:    Shade Flood, LCSW Phone Number: 04/17/2022, 12:09 PM    Pt admitted from home with wife. It appears pt has been nonamulatory at baseline since a previous stroke. Per MD, palliative consult will be requested. TOC will follow up after Palliative Care recommendations are made.  Transition of Care Department [facility]) has reviewed patient and no TOC needs have been identified at this time. We will continue to monitor patient advancement through interdisciplinary progression rounds. If new patient transition needs arise, please place a TOC consult.

## 2022-04-17 NOTE — ED Notes (Signed)
Wife at [facility], pt alert, NAD, calm, interactive. Wife reports speech more clear/ improved since last night.

## 2022-04-17 NOTE — Progress Notes (Signed)
ANTICOAGULATION CONSULT NOTE -   Pharmacy Consult for Heparin Indication: pulmonary embolus  Allergies  Allergen Reactions   Contrast Media [Iodinated Contrast Media]     Pt states he was given "dye" 20 yrs ago, anaphylaxis & syncope - was told to never have it again    Gabapentin Itching   Aspirin Other (See Comments)    Blistering    Latex Rash   Lipitor [Atorvastatin] Itching   Tape Rash    EKG leads    Patient Measurements: Height: 6' (182.9 cm) Weight: 89.4 kg (197 lb) IBW/kg (Calculated) : 77.6 Heparin Dosing Weight: 89.4 kg  Vital Signs: Temp: 99.2 F (37.3 C) (08/28 1222) Temp Source: Oral (08/28 1222) BP: 119/72 (08/28 1222) Pulse Rate: 71 (08/28 1222)  Labs: Recent Labs    04/16/22 1418 04/16/22 1540 04/17/22 0619 04/17/22 1348  HGB 12.2*  --  12.4*  --   HCT 41.1  --  40.9  --   PLT 245  --  248  --   HEPARINUNFRC  --   --  0.63 0.66  CREATININE 0.64  --   --  0.63  TROPONINIHS  --  15  --   --      Estimated Creatinine Clearance: 68.7 mL/min (by C-G formula based on SCr of 0.63 mg/dL).   Medical History: Past Medical History:  Diagnosis Date   AKI (acute kidney injury) ([place]) 10/08/2016   Anemia    04/2012: H&H-10.2/31.5, MCV-77; 06/2012:12/38.9   Arthritis    Benign prostatic hypertrophy    s/p transurethral resection of the prostate   Cellulitis 02/01/2021   Chest pain    2004; with dyspnea, stress nuclear-normal EF + questionable inferior wall ischemia, normal coronary angiography;  2010-negative stress nuclear   CVA (cerebral infarction)    asymptomatic (seen on CT)   Diarrhea    h/o Hemoccult-positive stool   Dysrhythmia    Edema of both legs    GERD (gastroesophageal reflux disease)    History of blood transfusion    History of kidney stones    History of urinary tract infection    Hypertension    Jerking movements of extremities    left arm    Low back pain    Obstructive sleep apnea    pt states can not use the CPAP    Peripheral neuropathy    lower legs and feet bilat    Persistent atrial fibrillation ([place]) 2013   Asymptomatic; diagnosed in 05/2012   Pneumonia    Prostate cancer ([place]) 2017   stage 4   Shortness of breath dyspnea    pt states only develops SOB if tries to do something too quickly   Upper GI bleed 1985   1985-peptic ulcer disease; initial hemigastrectomy; subsequent Billroth II   Urinary hesitancy    Vertigo    ED evaluation-06/2012     Assessment: 88 yr old male to begin IV heparin for pulmonary embolus with right heart strain per CTA.  Hx CVA 11/2021, on Plavix prior to admission. Hx atrial fibrillation, not on anticoagulation PTA. Head CT negative for acute event.  HL 0.66, remains therapeutic. F/U ECHO and plans for po tx  Goal of Therapy:  Heparin level 0.3-0.7 units/ml Monitor platelets by anticoagulation protocol: Yes   Plan:  Continue  heparin infusion at 1450 units/hr Daily heparin level and CBC while on heparin.  Isac Sarna, BS Pharm D, BCPS Clinical Pharmacist 04/17/2022,2:25 PM

## 2022-04-17 NOTE — Telephone Encounter (Signed)
Received call from patient's spouse to report he's been in the ED since yesterday. She called to keep Dr. Dennard Schaumann aware of what's going on with him and requested for this message to be sent to him.  She stated the following:  - he has a blood clot in the right lung for which they prescribed a much stronger blood thinner - he's got inspirational pneumonia  Please advise at [phone]

## 2022-04-17 NOTE — ED Notes (Signed)
Echo at BS 

## 2022-04-17 NOTE — Progress Notes (Signed)
Palliative-   Consult received and chart reviewed.   Called patient's spouse for [place] discussion.   Left message requesting return phone call.   Mariana Kaufman, AGNP-C Palliative Medicine  Please call Palliative Medicine team phone with any questions [phone]. For individual providers please see AMION.  No charge

## 2022-04-17 NOTE — Progress Notes (Signed)
Pharmacy Antibiotic Note  Bernard Ramirez is a 88 y.o. male admitted on 04/16/2022 with aspiration pneumonia.  Pharmacy has been consulted for unasyn dosing.  Plan: Unasyn 3g IV q6h F/U cxs and clinical progress Monitor V/S, labs  Height: 6' (182.9 cm) Weight: 89.4 kg (197 lb) IBW/kg (Calculated) : 77.6  Temp (24hrs), Avg:98 F (36.7 C), Min:97.3 F (36.3 C), Max:99.5 F (37.5 C)  Recent Labs  Lab 04/16/22 1418 04/17/22 0619  WBC 7.1 4.8  CREATININE 0.64  --     Estimated Creatinine Clearance: 68.7 mL/min (by C-G formula based on SCr of 0.64 mg/dL).    Allergies  Allergen Reactions   Contrast Media [Iodinated Contrast Media]     Pt states he was given "dye" 20 yrs ago, anaphylaxis & syncope - was told to never have it again    Gabapentin Itching   Aspirin Other (See Comments)    Blistering    Latex Rash   Lipitor [Atorvastatin] Itching   Tape Rash    EKG leads    Antimicrobials this admission: Unasyn  8/27 >>  Azithromycin 8/27 x 1   Microbiology results: 8/27 BCx: pending  Thank you for allowing pharmacy to be a part of this patient's care.  Isac Sarna, BS Pharm D, BCPS Clinical Pharmacist 04/17/2022 7:39 AM

## 2022-04-17 NOTE — Progress Notes (Signed)
PROGRESS NOTE    RAYNELL SCOTT  KWI:[id number] DOB: May 13, 1931 DOA: 04/16/2022 PCP: [name], MD   Brief Narrative:    EBER FERRUFINO is a 88 y.o. male with medical history significant of hypertension,  CVA, A-fib not on anticoagulation, hypertension, GERD, hyperlipidemia who presents to the emergency department via EMS due to increased work of breathing within the last 2 days.  Patient became nonverbal within the last 2 weeks per wife.  He was noted to have a CVA earlier this year and went to SNF for rehabilitation and then came home and was noted to have cough and shortness of breath.  He was admitted with acute PE and has findings of left sided DVT and was started on heparin drip.  He is also noted to have suspicion of aspiration pneumonia for which she has been started on Unasyn.  SLP evaluation pending.  Palliative care consulted.  Assessment & Plan:   Principal Problem:   Acute pulmonary embolism (HCC) Active Problems:   Hyperglycemia   Atrial fibrillation by electrocardiogram (HCC)   GERD without esophagitis   Mixed hyperlipidemia   CVA (cerebral vascular accident) ([place])   Aspiration pneumonia ([place])   Hypoalbuminemia due to protein-calorie malnutrition ([place])   Prediabetes  Assessment and Plan:  Acute pulmonary embolism/left DVT Patient presented with increasing shortness of breath CT angiography chest with contrast showed right lower lobe segmental and subsegmental pulmonary emboli with right heart strain Patient was started on IV heparin drip with plan to transition to [place] in the morning Bilateral lower extremity ultrasound with left femoral vein DVT Echocardiogram will be done in the morning   Possible aspiration pneumonia with acute metabolic encephalopathy Patient was started on Unasyn we shall continue same at this time with plan to de-escalate/discontinue based on blood culture, sputum culture, urine Legionella, strep pneumo and procalcitonin Continue  incentive spirometry, flutter valve Continue aspiration precaution Continue bedside swallow eval Continue SLP eval and treat in the morning   Hypoalbuminemia possibly secondary to moderate protein calorie malnutrition Albumin 2.7, consider protein supplements after SLP eval recommendation   Hyperglycemia due to prediabetes CBG 189, hemoglobin A1c about 4 months ago was 6.3 Continue to monitor blood glucose level with morning labs Repeat hemoglobin A1c and treat accordingly   Essential hypertension P.o. meds will be temporarily held at this time pending SLP eval Continue IV hydralazine 10 mg every 6 hours as needed for SBP > 170   Mixed hyperlipidemia Temporarily hold statin pending SLP eval and recommendations   GERD Continue IV Protonix 40 mg daily   Chronic atrial fibrillation CHA2DS2-VASc score of at least 6 Patient was not on any rate control or anticoagulant He was only on Plavix Continue IV heparin drip.   CVA Due to suspicion for aspiration pneumonia Plavix and statin temporarily held Resume meds based on SLP eval in the morning    DVT prophylaxis: Heparin drip Code Status: Full Family Communication: Tried calling wife with no response 8/28 Disposition Plan:  Status is: Inpatient Remains inpatient appropriate because: Need for IV medications   Consultants:  Palliative care  Procedures:  See below  Antimicrobials:  Anti-infectives (From admission, onward)    Start     Dose/Rate Route Frequency Ordered Stop   04/17/22 0800  Ampicillin-Sulbactam (UNASYN) 3 g in sodium chloride 0.9 % 100 mL IVPB        3 g 200 mL/hr over 30 Minutes Intravenous Every 6 hours 04/17/22 0746     04/16/22 2145  Ampicillin-Sulbactam (UNASYN) 3 g in sodium chloride 0.9 % 100 mL IVPB        3 g 200 mL/hr over 30 Minutes Intravenous  Once 04/16/22 2132 04/16/22 2328   04/16/22 2145  azithromycin (ZITHROMAX) 500 mg in sodium chloride 0.9 % 250 mL IVPB        500 mg 250 mL/hr over  60 Minutes Intravenous  Once 04/16/22 2132 04/17/22 0124      Subjective: Patient seen and evaluated today and cannot give any history as he is confused.  Objective: Vitals:   04/17/22 1100 04/17/22 1115 04/17/22 1130 04/17/22 1142  BP: (!) 124/59  (!) 112/93   Pulse:  67 64   Resp: 16 (!) 23 (!) 22   Temp:    (!) 97.5 F (36.4 C)  TempSrc:      SpO2:  95% 95%   Weight:      Height:        Intake/Output Summary (Last 24 hours) at 04/17/2022 1213 Last data filed at 04/17/2022 0430 Gross per 24 hour  Intake 1749 ml  Output --  Net 1749 ml   Filed Weights   04/16/22 1359  Weight: 89.4 kg    Examination:  General exam: Appears calm and comfortable, unresponsive to questioning Respiratory system: Clear to auscultation. Respiratory effort normal.  Nasal cannula Cardiovascular system: S1 & S2 heard, RRR.  Gastrointestinal system: Abdomen is soft Central nervous system: Alert and awake Extremities: No edema Skin: No significant lesions noted Psychiatry: Flat affect.    Data Reviewed: I have personally reviewed following labs and imaging studies  CBC: Recent Labs  Lab 04/16/22 1418 04/17/22 0619  WBC 7.1 4.8  NEUTROABS 4.8  --   HGB 12.2* 12.4*  HCT 41.1 40.9  MCV 84.0 82.6  PLT 245 245   Basic Metabolic Panel: Recent Labs  Lab 04/16/22 1418  NA 139  K 4.1  CL 102  CO2 29  GLUCOSE 189*  BUN 14  CREATININE 0.64  CALCIUM 9.1   GFR: Estimated Creatinine Clearance: 68.7 mL/min (by C-G formula based on SCr of 0.64 mg/dL). Liver Function Tests: Recent Labs  Lab 04/16/22 1418  AST 18  ALT 14  ALKPHOS 96  BILITOT 0.7  PROT 6.4*  ALBUMIN 2.7*   No results for input(s): "LIPASE", "AMYLASE" in the last 168 hours. Recent Labs  Lab 04/16/22 1540  AMMONIA 31   Coagulation Profile: No results for input(s): "INR", "PROTIME" in the last 168 hours. Cardiac Enzymes: No results for input(s): "CKTOTAL", "CKMB", "CKMBINDEX", "TROPONINI" in the last 168  hours. BNP (last 3 results) No results for input(s): "PROBNP" in the last 8760 hours. HbA1C: Recent Labs    04/17/22 0619  HGBA1C 6.1*   CBG: No results for input(s): "GLUCAP" in the last 168 hours. Lipid Profile: No results for input(s): "CHOL", "HDL", "LDLCALC", "TRIG", "CHOLHDL", "LDLDIRECT" in the last 72 hours. Thyroid Function Tests: No results for input(s): "TSH", "T4TOTAL", "FREET4", "T3FREE", "THYROIDAB" in the last 72 hours. Anemia Panel: No results for input(s): "VITAMINB12", "FOLATE", "FERRITIN", "TIBC", "IRON", "RETICCTPCT" in the last 72 hours. Sepsis Labs: Recent Labs  Lab 04/17/22 0619  PROCALCITON <0.10    Recent Results (from the past 240 hour(s))  SARS Coronavirus 2 by RT PCR (hospital order, performed in [facility] hospital lab) *cepheid single result test* Anterior Nasal Swab     Status: None   Collection Time: 04/16/22  2:18 PM   Specimen: Anterior Nasal Swab  Result Value Ref Range Status  SARS Coronavirus 2 by RT PCR NEGATIVE NEGATIVE Final    Comment: (NOTE) SARS-CoV-2 target nucleic acids are NOT DETECTED.  The SARS-CoV-2 RNA is generally detectable in upper and lower respiratory specimens during the acute phase of infection. The lowest concentration of SARS-CoV-2 viral copies this assay can detect is 250 copies / mL. A negative result does not preclude SARS-CoV-2 infection and should not be used as the sole basis for treatment or other patient management decisions.  A negative result may occur with improper specimen collection / handling, submission of specimen other than nasopharyngeal swab, presence of viral mutation(s) within the areas targeted by this assay, and inadequate number of viral copies (<250 copies / mL). A negative result must be combined with clinical observations, patient history, and epidemiological information.  Fact Sheet for Patients:   https://www.patel.info/  Fact Sheet for Healthcare  Providers: https://hall.com/  This test is not yet approved or  cleared by the [place] FDA and has been authorized for detection and/or diagnosis of SARS-CoV-2 by FDA under an Emergency Use Authorization (EUA).  This EUA will remain in effect (meaning this test can be used) for the duration of the COVID-19 declaration under Section 564(b)(1) of the Act, 21 U.S.C. section 360bbb-3(b)(1), unless the authorization is terminated or revoked sooner.  Performed at [facility], [address] [place], [place] [address]   Culture, blood (routine x 2)     Status: None (Preliminary result)   Collection Time: 04/16/22  3:38 PM   Specimen: Left Antecubital; Blood  Result Value Ref Range Status   Specimen Description LEFT ANTECUBITAL  Final   Special Requests   Final    BOTTLES DRAWN AEROBIC AND ANAEROBIC Blood Culture adequate volume Performed at [facility], [address], [place], [place] [address]    Culture PENDING  Incomplete   Report Status PENDING  Incomplete  Culture, blood (routine x 2)     Status: None (Preliminary result)   Collection Time: 04/16/22  9:49 PM   Specimen: BLOOD RIGHT FOREARM  Result Value Ref Range Status   Specimen Description BLOOD RIGHT FOREARM  Final   Special Requests   Final    BOTTLES DRAWN AEROBIC AND ANAEROBIC Blood Culture adequate volume Performed at [facility], [address], [place], [place] [address]    Culture PENDING  Incomplete   Report Status PENDING  Incomplete         Radiology Studies: US Venous Img Lower Bilateral (DVT)  Result Date: 04/17/2022 CLINICAL DATA:  Shortness of breath EXAM: BILATERAL LOWER EXTREMITY VENOUS DOPPLER ULTRASOUND TECHNIQUE: Gray-scale sonography with graded compression, as well as color Doppler and duplex ultrasound were performed to evaluate the lower extremity deep venous systems from the level of the common femoral vein and including the common femoral, femoral, profunda  femoral, popliteal and calf veins including the posterior tibial, peroneal and gastrocnemius veins when visible. The superficial great saphenous vein was also interrogated. Spectral Doppler was utilized to evaluate flow at rest and with distal augmentation maneuvers in the common femoral, femoral and popliteal veins. COMPARISON:  None Available. FINDINGS: RIGHT LOWER EXTREMITY Common Femoral Vein: No evidence of thrombus. Normal compressibility, respiratory phasicity and response to augmentation. Saphenofemoral Junction: No evidence of thrombus. Normal compressibility and flow on color Doppler imaging. Profunda Femoral Vein: No evidence of thrombus. Normal compressibility and flow on color Doppler imaging. Femoral Vein: No evidence of thrombus. Normal compressibility, respiratory phasicity and response to augmentation. Popliteal Vein: No evidence of thrombus. Normal compressibility, respiratory  phasicity and response to augmentation. Calf Veins: Limited visualization, within limitations no evidence of thrombus. Normal compressibility and flow on color Doppler imaging. Superficial Great Saphenous Vein: No evidence of thrombus. Normal compressibility. Venous Reflux:  None. Other Findings:  None. LEFT LOWER EXTREMITY Common Femoral Vein: No evidence of thrombus. Normal compressibility and flow on color Doppler imaging. Saphenofemoral Junction: No evidence of thrombus. Normal compressibility and flow on color Doppler imaging. Profunda Femoral Vein: No evidence of thrombus. Normal compressibility and flow on color Doppler imaging. Femoral Vein: Nonocclusive thrombus is seen in the proximal left femoral vein at the area of the bifurcation with the profunda. Popliteal Vein: No evidence of thrombus. Normal compressibility, respiratory phasicity and response to augmentation. Calf Veins: No evidence of thrombus. Normal compressibility and flow on color Doppler imaging. Superficial Great Saphenous Vein: No evidence of  thrombus. Normal compressibility. Venous Reflux:  None. Other Findings:  None. IMPRESSION: 1. Nonocclusive thrombus is seen in the proximal left femoral vein. 2. No evidence of right lower extremity DVT. Electronically Signed   By: Yetta Glassman M.D.   On: 04/17/2022 10:42   CT Angio Chest PE W and/or Wo Contrast  Result Date: 04/16/2022 CLINICAL DATA:  Positive D-dimer. EXAM: CT ANGIOGRAPHY CHEST WITH CONTRAST TECHNIQUE: Multidetector CT imaging of the chest was performed using the standard protocol during bolus administration of intravenous contrast. Multiplanar CT image reconstructions and MIPs were obtained to evaluate the vascular anatomy. RADIATION DOSE REDUCTION: This exam was performed according to the departmental dose-optimization program which includes automated exposure control, adjustment of the mA and/or kV according to patient size and/or use of iterative reconstruction technique. CONTRAST:  18m OMNIPAQUE IOHEXOL 350 MG/ML SOLN COMPARISON:  None Available. FINDINGS: Cardiovascular: Aorta is normal in size. The heart is mildly enlarged. There is adequate opacification of the pulmonary arteries. Pulmonary emboli are seen within segmental and subsegmental branches of the right lower lobe. Mediastinum/Nodes: No enlarged mediastinal, hilar, or axillary lymph nodes. Thyroid gland, trachea, and esophagus demonstrate no significant findings. There are calcified subcarinal lymph nodes likely related to old granulomatous disease. Lungs/Pleura: There are minimal patchy airspace and ground-glass opacities in the inferior right lower lobe. There is some secretions within the right lower lobe bronchus. There is a trace right pleural effusion. There is a 10 mm pleural-based nodule in the left upper lobe image 7/23. Upper Abdomen: Cholecystectomy clips are present. Musculoskeletal: No chest wall abnormality. No acute or significant osseous findings. Review of the MIP images confirms the above findings.  IMPRESSION: 1. Right lower lobe segmental and subsegmental pulmonary emboli. Positive for acute PE with CTevidence of right heart strain (RV/LV Ratio = 1.1) consistent with at least submassive (intermediate risk) PE. The presence of right heart strain has been associated with an increased risk of morbidity and mortality. 2. There secretions in the right lower lobe bronchus which may be related to aspiration. 3. Minimal ground-glass and patchy airspace opacities in the right lower lobe may represent pulmonary infarcts and/or infection including aspiration pneumonia given the above findings. 4. Cardiomegaly. Electronically Signed   By: ARonney AstersM.D.   On: 04/16/2022 20:54   CT Head Wo Contrast  Result Date: 04/16/2022 CLINICAL DATA:  Mental status changes of unknown EXAM: CT HEAD WITHOUT CONTRAST TECHNIQUE: Contiguous axial images were obtained from the base of the skull through the vertex without intravenous contrast. RADIATION DOSE REDUCTION: This exam was performed according to the departmental dose-optimization program which includes automated exposure control, adjustment of the mA and/or kV  according to patient size and/or use of iterative reconstruction technique. COMPARISON:  01/20/2022 FINDINGS: Brain: Generalized atrophy. Normal ventricular morphology. No midline shift or mass effect. Small vessel chronic ischemic changes of deep cerebral white matter. Old inferior RIGHT cerebellar and RIGHT parieto-occipital infarcts. Old inferior LEFT frontal and LEFT occipital infarcts. No intracranial hemorrhage, mass lesion, or evidence of acute infarction. No extra-axial fluid collections Vascular: No hyperdense vessels. Atherosclerotic calcifications of internal carotid arteries at skull base. Skull: Intact Cause Sinuses/Orbits: Clear Other: N/A IMPRESSION: Atrophy with small vessel chronic ischemic changes of deep cerebral white matter. Multiple old infarcts as above. No acute intracranial abnormalities.  Electronically Signed   By: Lavonia Dana M.D.   On: 04/16/2022 15:54   DG Chest Portable 1 View  Result Date: 04/16/2022 CLINICAL DATA:  Weakness and altered mental status. EXAM: PORTABLE CHEST 1 VIEW COMPARISON:  01/20/2022 and prior radiographs FINDINGS: The cardiomediastinal silhouette is unremarkable. Elevated RIGHT hemidiaphragm and RIGHT basilar atelectasis/scarring again noted. There is no evidence of focal airspace disease, pulmonary edema, suspicious pulmonary nodule/mass, pleural effusion, or pneumothorax. No acute bony abnormalities are identified. IMPRESSION: No evidence of acute cardiopulmonary disease. Chronic RIGHT basilar atelectasis/scarring. Electronically Signed   By: Margarette Canada M.D.   On: 04/16/2022 14:33        Scheduled Meds:  pantoprazole (PROTONIX) IV  40 mg Intravenous Q24H   Continuous Infusions:  sodium chloride 75 mL/hr at 04/17/22 0432   ampicillin-sulbactam (UNASYN) IV 3 g (04/17/22 0805)   heparin 1,450 Units/hr (04/17/22 0809)     LOS: 1 day    Time spent: 35 minutes    Willaim Mode Darleen Crocker, DO [facility]  If 7PM-7AM, please contact night-coverage [email or web address] 04/17/2022, 12:13 PM

## 2022-04-17 NOTE — Progress Notes (Signed)
*  PRELIMINARY RESULTS* Echocardiogram 2D Echocardiogram has been performed with Definity.  Bernard Ramirez 04/17/2022, 9:23 AM

## 2022-04-17 NOTE — Telephone Encounter (Signed)
FYI

## 2022-04-17 NOTE — ED Notes (Addendum)
Confused, hallucinating, reaching and grabbing into air above head, intermittently calm, mildly restless and fidgety, pulling at lines, removed self from monitor. Admitting into room. Tremor intermittently effecting monitor reading.

## 2022-04-18 DIAGNOSIS — I639 Cerebral infarction, unspecified: Secondary | ICD-10-CM | POA: Diagnosis not present

## 2022-04-18 DIAGNOSIS — I2699 Other pulmonary embolism without acute cor pulmonale: Secondary | ICD-10-CM | POA: Diagnosis not present

## 2022-04-18 DIAGNOSIS — J690 Pneumonitis due to inhalation of food and vomit: Secondary | ICD-10-CM | POA: Diagnosis not present

## 2022-04-18 DIAGNOSIS — Z7189 Other specified counseling: Secondary | ICD-10-CM

## 2022-04-18 LAB — MAGNESIUM: Magnesium: 2 mg/dL (ref 1.7–2.4)

## 2022-04-18 LAB — BASIC METABOLIC PANEL
Anion gap: 7 (ref 5–15)
BUN: 15 mg/dL (ref 8–23)
CO2: 27 mmol/L (ref 22–32)
Calcium: 8.8 mg/dL — ABNORMAL LOW (ref 8.9–10.3)
Chloride: 106 mmol/L (ref 98–111)
Creatinine, Ser: 0.62 mg/dL (ref 0.61–1.24)
GFR, Estimated: >60 mL/min (ref >60)
Glucose, Bld: 110 mg/dL — ABNORMAL HIGH (ref 70–99)
Potassium: 3.3 mmol/L — ABNORMAL LOW (ref 3.5–5.1)
Sodium: 140 mmol/L (ref 135–145)

## 2022-04-18 LAB — CBC
HCT: 38.1 % — ABNORMAL LOW (ref 39.0–52.0)
Hemoglobin: 11.5 g/dL — ABNORMAL LOW (ref 13.0–17.0)
MCH: 25.1 pg — ABNORMAL LOW (ref 26.0–34.0)
MCHC: 30.2 g/dL (ref 30.0–36.0)
MCV: 83 fL (ref 80.0–100.0)
Platelets: 262 10*3/uL (ref 150–400)
RBC: 4.59 MIL/uL (ref 4.22–5.81)
RDW: 14.9 % (ref 11.5–15.5)
WBC: 7.5 10*3/uL (ref 4.0–10.5)
nRBC: 0 % (ref 0.0–0.2)

## 2022-04-18 LAB — HEPARIN LEVEL (UNFRACTIONATED): Heparin Unfractionated: 0.23 IU/mL — ABNORMAL LOW (ref 0.30–0.70)

## 2022-04-18 LAB — STREP PNEUMONIAE URINARY ANTIGEN: Strep Pneumo Urinary Antigen: NEGATIVE

## 2022-04-18 MED ORDER — APIXABAN 5 MG PO TABS: 5.0000 mg | ORAL_TABLET | Freq: Two times a day (BID) | ORAL | Status: DC

## 2022-04-18 MED ORDER — APIXABAN 5 MG PO TABS
10.0000 mg | ORAL_TABLET | Freq: Two times a day (BID) | ORAL | Status: DC
  Administered 2022-04-18 (×2): 10 mg via ORAL
  Filled 2022-04-18 (×3): qty 2

## 2022-04-18 MED ORDER — POTASSIUM CHLORIDE CRYS ER 20 MEQ PO TBCR
40.0000 meq | EXTENDED_RELEASE_TABLET | Freq: Once | ORAL | Status: AC
Start: 2022-04-18 — End: 2022-04-18
  Administered 2022-04-18: 40 meq via ORAL
  Filled 2022-04-18: qty 2

## 2022-04-18 MED ORDER — PANTOPRAZOLE SODIUM 40 MG PO TBEC
40.0000 mg | DELAYED_RELEASE_TABLET | Freq: Every day | ORAL | Status: DC
  Filled 2022-04-18 (×2): qty 1

## 2022-04-18 NOTE — Progress Notes (Signed)
Initial Nutrition Assessment  DOCUMENTATION CODES:      INTERVENTION:  Magic cup TID with meals, each supplement provides 290 kcal and 9 grams of protein   Assist with feeding   Discontinue Ensure Enlive (unless cleared by SLP) the product is not nectar thick.  Obtain current weight  NUTRITION DIAGNOSIS:   Inadequate oral intake related to acute illness as evidenced by per patient/family report (patient not accepting po intake).   GOAL:  Patient will meet greater than or equal to 90% of their needs   MONITOR:  PO intake, Supplement acceptance, Labs, Weight trends  REASON FOR ASSESSMENT:   Malnutrition Screening Tool    ASSESSMENT: Patient is a 88 yo male with history of CVA, GERD, HTN, Peripheral neuropathy, edema, anemia and CVA. Presents with acute pulmonary embolism/left DVT. Possible aspiration pneumonia.   Palliative consulted.- GOC discussion.  Patient wife is bedside and provided history. Decline over the past 2 weeks had become non-verbal. Patient eyes are open but not responding to his name. He was suprisingly talkative yesterday but totally different today and back to where he was PTA per spouse. She is trying to feeding him but he is not accepting food or fluids. Followed by SLP.  Unable to feed himself. Has green mitts at time of RD visit.  Weights reviewed. Question significant weight loss of 20 kg in 2 months. Will request reweight. Likely malnutrition.   Medications: protonix.   IVF-NS'@75'$  ml/hr     Latest Ref Rng & Units 04/18/2022    3:42 AM 04/17/2022    1:48 PM 04/16/2022    2:18 PM  BMP  Glucose 70 - 99 mg/dL 110  127  189   BUN 8 - 23 mg/dL '15  16  14   '$ Creatinine 0.61 - 1.24 mg/dL 0.62  0.63  0.64   Sodium 135 - 145 mmol/L 140  140  139   Potassium 3.5 - 5.1 mmol/L 3.3  4.3  4.1   Chloride 98 - 111 mmol/L 106  103  102   CO2 22 - 32 mmol/L '27  27  29   '$ Calcium 8.9 - 10.3 mg/dL 8.8  9.1  9.1       NUTRITION - FOCUSED PHYSICAL  EXAM:  Flowsheet Row Most Recent Value  Orbital Region Moderate depletion  Upper Arm Region Mild depletion  Buccal Region Mild depletion  Clavicle Bone Region Moderate depletion  Clavicle and Acromion Bone Region Moderate depletion  Dorsal Hand Unable to assess  [green mitts]  Posterior Calf Region Unable to assess  [scd's]  Edema (RD Assessment) Moderate  Hair Reviewed  Eyes Reviewed  Mouth Unable to assess  Skin Reviewed  Nails Unable to assess       Diet Order:   Diet Order             DIET DYS 2 Room service appropriate? Yes; Fluid consistency: Nectar Thick  Diet effective now                   EDUCATION NEEDS:  Education needs have been addressed  Skin:  Skin Assessment: Reviewed RN Assessment  Last BM:  8/25  Height:   Ht Readings from Last 1 Encounters:  04/16/22 6' (1.829 m)    Weight:   Wt Readings from Last 1 Encounters:  04/16/22 89.4 kg    Ideal Body Weight:   81 kg  BMI:  Body mass index is 26.72 kg/m.  Estimated Nutritional Needs:   Kcal:  2000-2200  Protein:  105-110 gr  Fluid:  2 liters daily   Colman Cater MS,RD,CSG,LDN Contact: Shea Evans

## 2022-04-18 NOTE — Progress Notes (Signed)
PROGRESS NOTE    Bernard Ramirez  PXT:[id number] DOB: [birth year] DOA: 04/16/2022 PCP: [name], MD   Brief Narrative:  Bernard Ramirez is a 88 y.o. male with medical history significant of hypertension,  CVA, A-fib not on anticoagulation, hypertension, GERD, hyperlipidemia who presents to the emergency department via EMS due to increased work of breathing within the last 2 days.  Patient became nonverbal within the last 2 weeks per wife.  He was noted to have a CVA earlier this year and went to SNF for rehabilitation and then came home and was noted to have cough and shortness of breath.  He was admitted with acute PE and has findings of left sided DVT and was started on heparin drip.  He is also noted to have suspicion of aspiration pneumonia for which she has been started on Unasyn.  SLP evaluation deferred.  Palliative care consulted and now plans are for home with hospice by 8/30.    Assessment & Plan:   Principal Problem:   Acute pulmonary embolism (HCC) Active Problems:   Hyperglycemia   Atrial fibrillation by electrocardiogram (HCC)   GERD without esophagitis   Mixed hyperlipidemia   CVA (cerebral vascular accident) ([place])   Aspiration pneumonia ([place])   Hypoalbuminemia due to protein-calorie malnutrition ([place])   Prediabetes  Assessment and Plan:   Acute pulmonary embolism/left DVT Patient presented with increasing shortness of breath CT angiography chest with contrast showed right lower lobe segmental and subsegmental pulmonary emboli with right heart strain Transitioned to Eliquis now Bilateral lower extremity ultrasound with left femoral vein DVT Echocardiogram without RV strain LVEF 55-60%   Possible aspiration pneumonia with acute metabolic encephalopathy Patient was started on Unasyn we shall continue same at this time with plan to de-escalate/discontinue based on blood culture, sputum culture, urine Legionella, strep pneumo negative Continue incentive  spirometry, flutter valve Continue aspiration precaution Continue bedside swallow eval Continue SLP eval deferred as plan is for hospice on DC   Hypoalbuminemia possibly secondary to moderate protein calorie malnutrition Albumin 2.7, consider protein supplements after SLP eval recommendation  Hypokalemia Replete and follow in am   Hyperglycemia due to prediabetes CBG 189, hemoglobin A1c about 4 months ago was 6.3 Continue to monitor blood glucose level with morning labs Repeat hemoglobin A1c and treat accordingly   Essential hypertension P.o. meds will be temporarily held at this time pending SLP eval Continue IV hydralazine 10 mg every 6 hours as needed for SBP > 170   Mixed hyperlipidemia Temporarily hold statin pending SLP eval and recommendations   GERD Continue IV Protonix 40 mg daily   Chronic atrial fibrillation CHA2DS2-VASc score of at least 6 Patient was not on any rate control or anticoagulant He was only on Plavix Continue IV heparin drip.   CVA Due to suspicion for aspiration pneumonia Plavix and statin temporarily held Resume meds based on SLP eval in the morning     DVT prophylaxis: Eliquis Code Status: Full Family Communication: Wife at bedside 8/29 Disposition Plan:  Status is: Inpatient Remains inpatient appropriate because: Need for IV medications     Consultants:  Palliative care   Procedures:  See below   Antimicrobials:  Anti-infectives (From admission, onward)    Start     Dose/Rate Route Frequency Ordered Stop   04/17/22 0800  Ampicillin-Sulbactam (UNASYN) 3 g in sodium chloride 0.9 % 100 mL IVPB        3 g 200 mL/hr over 30 Minutes Intravenous Every 6 hours 04/17/22  2694     04/16/22 2145  Ampicillin-Sulbactam (UNASYN) 3 g in sodium chloride 0.9 % 100 mL IVPB        3 g 200 mL/hr over 30 Minutes Intravenous  Once 04/16/22 2132 04/16/22 2328   04/16/22 2145  azithromycin (ZITHROMAX) 500 mg in sodium chloride 0.9 % 250 mL IVPB         500 mg 250 mL/hr over 60 Minutes Intravenous  Once 04/16/22 2132 04/17/22 0124       Subjective: Patient seen and evaluated today and is lethargic and unresponsive to questioning. No acute overnight events noted.  Objective: Vitals:   04/18/22 0217 04/18/22 0523 04/18/22 1025 04/18/22 1355  BP: 123/67 133/71 110/62 109/67  Pulse: (!) 57 87 (!) 58 65  Resp:    16  Temp: (!) 97.5 F (36.4 C) 97.8 F (36.6 C)  97.9 F (36.6 C)  TempSrc: Axillary Axillary    SpO2:    99%  Weight:      Height:        Intake/Output Summary (Last 24 hours) at 04/18/2022 1550 Last data filed at 04/18/2022 8546 Gross per 24 hour  Intake 120 ml  Output 400 ml  Net -280 ml   Filed Weights   04/16/22 1359  Weight: 89.4 kg    Examination:  General exam: Appears unresponsive Respiratory system: Clear to auscultation. Respiratory effort normal. [place] Cardiovascular system: S1 & S2 heard, RRR.  Gastrointestinal system: Abdomen is soft Central nervous system: Alert and awake Extremities: No edema Skin: No significant lesions noted Psychiatry: Flat affect.    Data Reviewed: I have personally reviewed following labs and imaging studies  CBC: Recent Labs  Lab 04/16/22 1418 04/17/22 0619 04/18/22 0342  WBC 7.1 4.8 7.5  NEUTROABS 4.8  --   --   HGB 12.2* 12.4* 11.5*  HCT 41.1 40.9 38.1*  MCV 84.0 82.6 83.0  PLT 245 248 270   Basic Metabolic Panel: Recent Labs  Lab 04/16/22 1418 04/17/22 1348 04/18/22 0342  NA 139 140 140  K 4.1 4.3 3.3*  CL 102 103 106  CO2 '29 27 27  '$ GLUCOSE 189* 127* 110*  BUN '14 16 15  '$ CREATININE 0.64 0.63 0.62  CALCIUM 9.1 9.1 8.8*  MG  --  2.1 2.0  PHOS  --  3.0  --    GFR: Estimated Creatinine Clearance: 68.7 mL/min (by C-G formula based on SCr of 0.62 mg/dL). Liver Function Tests: Recent Labs  Lab 04/16/22 1418 04/17/22 1348  AST 18 20  ALT 14 14  ALKPHOS 96 98  BILITOT 0.7 0.8  PROT 6.4* 6.7  ALBUMIN 2.7* 2.8*   No results for input(s):  "LIPASE", "AMYLASE" in the last 168 hours. Recent Labs  Lab 04/16/22 1540  AMMONIA 31   Coagulation Profile: No results for input(s): "INR", "PROTIME" in the last 168 hours. Cardiac Enzymes: No results for input(s): "CKTOTAL", "CKMB", "CKMBINDEX", "TROPONINI" in the last 168 hours. BNP (last 3 results) No results for input(s): "PROBNP" in the last 8760 hours. HbA1C: Recent Labs    04/17/22 0619  HGBA1C 6.1*   CBG: No results for input(s): "GLUCAP" in the last 168 hours. Lipid Profile: No results for input(s): "CHOL", "HDL", "LDLCALC", "TRIG", "CHOLHDL", "LDLDIRECT" in the last 72 hours. Thyroid Function Tests: No results for input(s): "TSH", "T4TOTAL", "FREET4", "T3FREE", "THYROIDAB" in the last 72 hours. Anemia Panel: No results for input(s): "VITAMINB12", "FOLATE", "FERRITIN", "TIBC", "IRON", "RETICCTPCT" in the last 72 hours. Sepsis Labs: Recent Labs  Lab 04/17/22 0619  PROCALCITON <0.10    Recent Results (from the past 240 hour(s))  SARS Coronavirus 2 by RT PCR (hospital order, performed in [facility] hospital lab) *cepheid single result test* Anterior Nasal Swab     Status: None   Collection Time: 04/16/22  2:18 PM   Specimen: Anterior Nasal Swab  Result Value Ref Range Status   SARS Coronavirus 2 by RT PCR NEGATIVE NEGATIVE Final    Comment: (NOTE) SARS-CoV-2 target nucleic acids are NOT DETECTED.  The SARS-CoV-2 RNA is generally detectable in upper and lower respiratory specimens during the acute phase of infection. The lowest concentration of SARS-CoV-2 viral copies this assay can detect is 250 copies / mL. A negative result does not preclude SARS-CoV-2 infection and should not be used as the sole basis for treatment or other patient management decisions.  A negative result may occur with improper specimen collection / handling, submission of specimen other than nasopharyngeal swab, presence of viral mutation(s) within the areas targeted by this assay, and  inadequate number of viral copies (<250 copies / mL). A negative result must be combined with clinical observations, patient history, and epidemiological information.  Fact Sheet for Patients:   https://www.patel.info/  Fact Sheet for Healthcare Providers: https://hall.com/  This test is not yet approved or  cleared by the [place] FDA and has been authorized for detection and/or diagnosis of SARS-CoV-2 by FDA under an Emergency Use Authorization (EUA).  This EUA will remain in effect (meaning this test can be used) for the duration of the COVID-19 declaration under Section 564(b)(1) of the Act, 21 U.S.C. section 360bbb-3(b)(1), unless the authorization is terminated or revoked sooner.  Performed at [facility], [address] [place], [place] [address]   Culture, blood (routine x 2)     Status: None (Preliminary result)   Collection Time: 04/16/22  3:38 PM   Specimen: Left Antecubital; Blood  Result Value Ref Range Status   Specimen Description LEFT ANTECUBITAL  Final   Special Requests   Final    BOTTLES DRAWN AEROBIC AND ANAEROBIC Blood Culture adequate volume   Culture   Final    NO GROWTH 2 DAYS Performed at [facility], [address] [place], [place] [address]    Report Status PENDING  Incomplete  Culture, blood (routine x 2)     Status: None (Preliminary result)   Collection Time: 04/16/22  9:49 PM   Specimen: BLOOD RIGHT FOREARM  Result Value Ref Range Status   Specimen Description BLOOD RIGHT FOREARM  Final   Special Requests   Final    BOTTLES DRAWN AEROBIC AND ANAEROBIC Blood Culture adequate volume   Culture   Final    NO GROWTH 2 DAYS Performed at [facility], [address], [place], [place] [address]    Report Status PENDING  Incomplete         Radiology Studies: ECHOCARDIOGRAM COMPLETE  Result Date: 04/17/2022    ECHOCARDIOGRAM REPORT   Patient Name:   Bernard Ramirez Date of Exam: 04/17/2022  Medical Rec #:  [id number]        Height:       72.0 in Accession #:    [id number]       Weight:       197.0 lb Date of Birth:  [birth year]         BSA:          2.117 m Patient Age:    67 years  BP:           121/63 mmHg Patient Gender: M                HR:           85 bpm. Exam Location:  [facility] Procedure: 2D Echo, Cardiac Doppler and Color Doppler Indications:    Pulmonary Embolus I26.09  History:        Patient has prior history of Echocardiogram examinations, most                 recent 11/23/2021. CHF, Stroke, Arrythmias:Atrial Fibrillation;                 Risk Factors:Hypertension, Dyslipidemia and Prediabetes.                 Obstructive sleep apnea (From Hx).  Sonographer:    Alvino Chapel RCS Referring Phys: [id number] OLADAPO ADEFESO IMPRESSIONS  1. Left ventricular ejection fraction, by estimation, is 55 to 60%. The left ventricle has normal function. The left ventricle has no regional wall motion abnormalities. There is mild left ventricular hypertrophy. Left ventricular diastolic parameters are indeterminate.  2. Right ventricular systolic function is normal. The right ventricular size is mildly enlarged.  3. Left atrial size was severely dilated.  4. Right atrial size was severely dilated.  5. The mitral valve is normal in structure. No evidence of mitral valve regurgitation. No evidence of mitral stenosis.  6. Tricuspid valve regurgitation is mild to moderate.  7. The aortic valve is tricuspid. There is moderate calcification of the aortic valve. There is moderate thickening of the aortic valve. Aortic valve regurgitation is not visualized. Aortic valve sclerosis/calcification is present, without any evidence of aortic stenosis. Aortic valve mean gradient measures 8.0 mmHg. Aortic valve Vmax measures 1.80 m/s.  8. The inferior vena cava is dilated in size with <50% respiratory variability, suggesting right atrial pressure of 15 mmHg. Comparison(s): No significant change from prior study. Prior  images reviewed side by side. FINDINGS  Left Ventricle: Left ventricular ejection fraction, by estimation, is 55 to 60%. The left ventricle has normal function. The left ventricle has no regional wall motion abnormalities. The left ventricular internal cavity size was normal in size. There is  mild left ventricular hypertrophy. Left ventricular diastolic parameters are indeterminate. Right Ventricle: The right ventricular size is mildly enlarged. No increase in right ventricular wall thickness. Right ventricular systolic function is normal. Left Atrium: Left atrial size was severely dilated. Right Atrium: Right atrial size was severely dilated. Pericardium: There is no evidence of pericardial effusion. Mitral Valve: The mitral valve is normal in structure. No evidence of mitral valve regurgitation. No evidence of mitral valve stenosis. Tricuspid Valve: The tricuspid valve is normal in structure. Tricuspid valve regurgitation is mild to moderate. No evidence of tricuspid stenosis. Aortic Valve: The aortic valve is tricuspid. There is moderate calcification of the aortic valve. There is moderate thickening of the aortic valve. Aortic valve regurgitation is not visualized. Aortic valve sclerosis/calcification is present, without any  evidence of aortic stenosis. Aortic valve mean gradient measures 8.0 mmHg. Aortic valve peak gradient measures 13.0 mmHg. Aortic valve area, by VTI measures 1.45 cm. Pulmonic Valve: The pulmonic valve was normal in structure. Pulmonic valve regurgitation is trivial. No evidence of pulmonic stenosis. Aorta: The aortic root is normal in size and structure. Venous: The inferior vena cava is dilated in size with less than 50% respiratory variability, suggesting right atrial pressure of 15 mmHg.  IAS/Shunts: No atrial level shunt detected by color flow Doppler.  LEFT VENTRICLE PLAX 2D LVIDd:         4.70 cm LVIDs:         2.90 cm LV PW:         1.30 cm LV IVS:        1.30 cm LVOT diam:     2.00  cm LV SV:         58 LV SV Index:   28 LVOT Area:     3.14 cm  RIGHT VENTRICLE TAPSE (M-mode): 1.5 cm LEFT ATRIUM              Index        RIGHT ATRIUM           Index LA diam:        3.40 cm  1.61 cm/m   RA Area:     22.50 cm LA Vol (A2C):   51.2 ml  24.19 ml/m  RA Volume:   62.80 ml  29.67 ml/m LA Vol (A4C):   118.0 ml 55.74 ml/m LA Biplane Vol: 78.7 ml  37.18 ml/m  AORTIC VALVE AV Area (Vmax):    1.57 cm AV Area (Vmean):   1.37 cm AV Area (VTI):     1.45 cm AV Vmax:           180.00 cm/s AV Vmean:          134.000 cm/s AV VTI:            0.404 m AV Peak Grad:      13.0 mmHg AV Mean Grad:      8.0 mmHg LVOT Vmax:         90.00 cm/s LVOT Vmean:        58.400 cm/s LVOT VTI:          0.186 m LVOT/AV VTI ratio: 0.46  AORTA Ao Root diam: 3.90 cm MITRAL VALVE               TRICUSPID VALVE MV Area (PHT): 4.31 cm    TR Peak grad:   27.9 mmHg MV Decel Time: 176 msec    TR Vmax:        264.00 cm/s MV E velocity: 96.30 cm/s                            SHUNTS                            Systemic VTI:  0.19 m                            Systemic Diam: 2.00 cm Candee Furbish MD Electronically signed by Candee Furbish MD Signature Date/Time: 04/17/2022/3:50:43 PM    Final    US Venous Img Lower Bilateral (DVT)  Result Date: 04/17/2022 CLINICAL DATA:  Shortness of breath EXAM: BILATERAL LOWER EXTREMITY VENOUS DOPPLER ULTRASOUND TECHNIQUE: Gray-scale sonography with graded compression, as well as color Doppler and duplex ultrasound were performed to evaluate the lower extremity deep venous systems from the level of the common femoral vein and including the common femoral, femoral, profunda femoral, popliteal and calf veins including the posterior tibial, peroneal and gastrocnemius veins when visible. The superficial great saphenous vein was also interrogated. Spectral Doppler was utilized to evaluate flow at rest and with distal augmentation  maneuvers in the common femoral, femoral and popliteal veins. COMPARISON:  None  Available. FINDINGS: RIGHT LOWER EXTREMITY Common Femoral Vein: No evidence of thrombus. Normal compressibility, respiratory phasicity and response to augmentation. Saphenofemoral Junction: No evidence of thrombus. Normal compressibility and flow on color Doppler imaging. Profunda Femoral Vein: No evidence of thrombus. Normal compressibility and flow on color Doppler imaging. Femoral Vein: No evidence of thrombus. Normal compressibility, respiratory phasicity and response to augmentation. Popliteal Vein: No evidence of thrombus. Normal compressibility, respiratory phasicity and response to augmentation. Calf Veins: Limited visualization, within limitations no evidence of thrombus. Normal compressibility and flow on color Doppler imaging. Superficial Great Saphenous Vein: No evidence of thrombus. Normal compressibility. Venous Reflux:  None. Other Findings:  None. LEFT LOWER EXTREMITY Common Femoral Vein: No evidence of thrombus. Normal compressibility and flow on color Doppler imaging. Saphenofemoral Junction: No evidence of thrombus. Normal compressibility and flow on color Doppler imaging. Profunda Femoral Vein: No evidence of thrombus. Normal compressibility and flow on color Doppler imaging. Femoral Vein: Nonocclusive thrombus is seen in the proximal left femoral vein at the area of the bifurcation with the profunda. Popliteal Vein: No evidence of thrombus. Normal compressibility, respiratory phasicity and response to augmentation. Calf Veins: No evidence of thrombus. Normal compressibility and flow on color Doppler imaging. Superficial Great Saphenous Vein: No evidence of thrombus. Normal compressibility. Venous Reflux:  None. Other Findings:  None. IMPRESSION: 1. Nonocclusive thrombus is seen in the proximal left femoral vein. 2. No evidence of right lower extremity DVT. Electronically Signed   By: Yetta Glassman M.D.   On: 04/17/2022 10:42   CT Angio Chest PE W and/or Wo Contrast  Result Date:  04/16/2022 CLINICAL DATA:  Positive D-dimer. EXAM: CT ANGIOGRAPHY CHEST WITH CONTRAST TECHNIQUE: Multidetector CT imaging of the chest was performed using the standard protocol during bolus administration of intravenous contrast. Multiplanar CT image reconstructions and MIPs were obtained to evaluate the vascular anatomy. RADIATION DOSE REDUCTION: This exam was performed according to the departmental dose-optimization program which includes automated exposure control, adjustment of the mA and/or kV according to patient size and/or use of iterative reconstruction technique. CONTRAST:  51m OMNIPAQUE IOHEXOL 350 MG/ML SOLN COMPARISON:  None Available. FINDINGS: Cardiovascular: Aorta is normal in size. The heart is mildly enlarged. There is adequate opacification of the pulmonary arteries. Pulmonary emboli are seen within segmental and subsegmental branches of the right lower lobe. Mediastinum/Nodes: No enlarged mediastinal, hilar, or axillary lymph nodes. Thyroid gland, trachea, and esophagus demonstrate no significant findings. There are calcified subcarinal lymph nodes likely related to old granulomatous disease. Lungs/Pleura: There are minimal patchy airspace and ground-glass opacities in the inferior right lower lobe. There is some secretions within the right lower lobe bronchus. There is a trace right pleural effusion. There is a 10 mm pleural-based nodule in the left upper lobe image 7/23. Upper Abdomen: Cholecystectomy clips are present. Musculoskeletal: No chest wall abnormality. No acute or significant osseous findings. Review of the MIP images confirms the above findings. IMPRESSION: 1. Right lower lobe segmental and subsegmental pulmonary emboli. Positive for acute PE with CTevidence of right heart strain (RV/LV Ratio = 1.1) consistent with at least submassive (intermediate risk) PE. The presence of right heart strain has been associated with an increased risk of morbidity and mortality. 2. There secretions  in the right lower lobe bronchus which may be related to aspiration. 3. Minimal ground-glass and patchy airspace opacities in the right lower lobe may represent pulmonary infarcts and/or infection including aspiration pneumonia  given the above findings. 4. Cardiomegaly. Electronically Signed   By: Ronney Asters M.D.   On: 04/16/2022 20:54        Scheduled Meds:  apixaban  10 mg Oral BID   Followed by   Derrill Memo ON 04/25/2022] apixaban  5 mg Oral BID   mouth rinse  15 mL Mouth Rinse 4 times per day   pantoprazole  40 mg Oral Daily   Continuous Infusions:  sodium chloride 75 mL/hr at 04/18/22 0938   ampicillin-sulbactam (UNASYN) IV 3 g (04/18/22 1414)     LOS: 2 days    Time spent: 35 minutes    Anaisabel Pederson Darleen Crocker, DO [facility]  If 7PM-7AM, please contact night-coverage [email or web address] 04/18/2022, 3:50 PM

## 2022-04-18 NOTE — Progress Notes (Signed)
ANTICOAGULATION CONSULT NOTE -   Pharmacy Consult for Heparin=> Apixaban Indication: pulmonary embolus  Allergies  Allergen Reactions   Contrast Media [Iodinated Contrast Media]     Pt states he was given "dye" 20 yrs ago, anaphylaxis & syncope - was told to never have it again    Gabapentin Itching   Aspirin Other (See Comments)    Blistering    Latex Rash   Lipitor [Atorvastatin] Itching   Tape Rash    EKG leads    Patient Measurements: Height: 6' (182.9 cm) Weight: 89.4 kg (197 lb) IBW/kg (Calculated) : 77.6 Heparin Dosing Weight: 89.4 kg  Vital Signs: Temp: 97.8 F (36.6 C) (08/29 0523) Temp Source: Axillary (08/29 0523) BP: 133/71 (08/29 0523) Pulse Rate: 87 (08/29 0523)  Labs: Recent Labs    04/16/22 1418 04/16/22 1540 04/17/22 0619 04/17/22 1348 04/18/22 0342  HGB 12.2*  --  12.4*  --  11.5*  HCT 41.1  --  40.9  --  38.1*  PLT 245  --  248  --  262  APTT  --   --   --  55*  --   HEPARINUNFRC  --   --  0.63 0.66 0.23*  CREATININE 0.64  --   --  0.63 0.62  TROPONINIHS  --  15  --   --   --      Estimated Creatinine Clearance: 68.7 mL/min (by C-G formula based on SCr of 0.62 mg/dL).   Medical History: Past Medical History:  Diagnosis Date   AKI (acute kidney injury) ([place]) 10/08/2016   Anemia    04/2012: H&H-10.2/31.5, MCV-77; 06/2012:12/38.9   Arthritis    Benign prostatic hypertrophy    s/p transurethral resection of the prostate   Cellulitis 02/01/2021   Chest pain    2004; with dyspnea, stress nuclear-normal EF + questionable inferior wall ischemia, normal coronary angiography;  2010-negative stress nuclear   CVA (cerebral infarction)    asymptomatic (seen on CT)   Diarrhea    h/o Hemoccult-positive stool   Dysrhythmia    Edema of both legs    GERD (gastroesophageal reflux disease)    History of blood transfusion    History of kidney stones    History of urinary tract infection    Hypertension    Jerking movements of extremities    left  arm    Low back pain    Obstructive sleep apnea    pt states can not use the CPAP   Peripheral neuropathy    lower legs and feet bilat    Persistent atrial fibrillation ([place]) 2013   Asymptomatic; diagnosed in 05/2012   Pneumonia    Prostate cancer ([place]) 2017   stage 4   Shortness of breath dyspnea    pt states only develops SOB if tries to do something too quickly   Upper GI bleed 1985   1985-peptic ulcer disease; initial hemigastrectomy; subsequent Billroth II   Urinary hesitancy    Vertigo    ED evaluation-06/2012     Assessment: 88 yr old male to begin IV heparin for pulmonary embolus with right heart strain per CTA.  Hx CVA 11/2021, on Plavix prior to admission. Hx atrial fibrillation, not on anticoagulation PTA. Head CT negative for acute event.  Heparin to transition to po treatment this AM with apixaban  Goal of Therapy:  Heparin level 0.3-0.7 units/ml Monitor platelets by anticoagulation protocol: Yes   Plan:  Eliquis '10mg'$  po bid x 7 days, then '5mg'$  po bid  Educate on eliquis Monitor CBC  while on eliquis.  Isac Sarna, BS Pharm D, BCPS Clinical Pharmacist 04/18/2022,7:30 AM

## 2022-04-18 NOTE — Progress Notes (Signed)
SLP Cancellation Note  Patient Details Name: Bernard Ramirez MRN: [id number] DOB: March 05, 1931   Cancelled treatment:       Reason Eval/Treat Not Completed: Other (comment). Pt's wife has made the decision for Pt to go home with Hospice and to pursue comfort measures. There are no further ST needs noted at this time. Defer to Hospice to make further recommendations for diet and/or comfort feeds. ST will sign off, thank you.  Camila Maita H. Roddie Mc, CCC-SLP Speech Language Pathologist    Wende Bushy 04/18/2022, 3:18 PM

## 2022-04-18 NOTE — Consult Note (Signed)
Consultation Note Date: 04/18/2022   Patient Name: Bernard Ramirez  DOB: 03/24/1931  MRN: [id number]  Age / Sex: 88 y.o., male  PCP: Bernard Frizzle, MD Referring Physician: Rodena Goldmann, DO  Reason for Consultation:  goals of care  HPI/Patient Profile: 88 y.o. male  with past medical history of debilitating CVA in April, prostate cancer with mets to his bones, A-fib admitted on 04/16/2022 with acute PE, DVT, aspiration pneumonia.  He had just been discharged from SNF where he was treated for pneumonia there as well.  So this is a recurrent pneumonia.  Palliative medicine consulted for goals of care  Primary Decision Maker NEXT OF KIN spouse Bernard Ramirez  Discussion: I have reviewed medical records including Care Everywhere, progress notes from this and prior admissions, labs and imaging, discussed with RN.  On evaluation patient is sleeping heavily.  He opens his eyes to some stimulation but he does not answer my questions, he does not follow any commands.  Per nursing he was slightly awake this morning and able to say his name but could not say what his favorite sports team was.  I introduced Palliative Medicine as specialized medical care for people living with serious illness. It focuses on providing relief from the symptoms and stress of a serious illness. The goal is to improve quality of life for both the patient and the family.  I met with his spouse Bernard Ramirez at the bedside.  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  He and Bernard Ramirez met 24 years ago in [place].  As far as functional and nutritional status -prior to this admission he was bedbound and dependent for all ADLs.  His mental status waxes and wanes.  Apparently, last night he was awake and alert and carrying on a lucid conversation with his children.  We discussed patient's current illness and what it means in  the larger context of patient's on-going co-morbidities.  Natural disease trajectory and expectations at EOL were discussed.  I attempted to elicit values and goals of care important to the patient.  Bernard Ramirez feels patient is suffering and would not want to continue to suffer. It is also extremely important that patient is not discharged to a nursing facility, and that he does not die in the hospital.  The difference between aggressive medical intervention and comfort care was considered in light of the patient's goals of care.   I expressed my concern to Bernard Ramirez that patient is nearing end-of-life.  I worry that if he continues with hospitalizations he will end up dying in the hospital.  Bernard Ramirez is tearful and expresses her feelings that patient is at end-of-life.  She and other family members have seen various signs indicating that he is close to dying.  It is very important that he is at home.  However, she does not want to make any decisions without speaking to the patient's attending provider Dr. Manuella Ghazi first.  Advance directives, concepts specific to code  status, artificial feeding and hydration, and rehospitalization were considered and discussed.  He is DNR and would not want artificial feeding.  Hospice and Palliative Care services outpatient were explained and offered.   Questions and concerns were addressed. The family was encouraged to call with questions or concerns.     SUMMARY OF RECOMMENDATIONS  -Given the significant desire for patient to die in his home I would recommend a discharge home as soon as possible with hospice -Patient's spouse would like to meet with Dr. Manuella Ghazi before making a decision  *I returned to the bedside after Dr. Manuella Ghazi had spoken with patient's wife-she would like to go home as soon as possible with hospice Dr. Manuella Ghazi has notified transitions of care  *Recommend he be discharged home with the following comfort meds:  - Morphine Concentrate 65m/0.5ml: 522m (0.2551msublingual every 1 hour as needed for pain or shortness of breath: Disp 72m84mLorazepam 2mg/71mconcentrated solution: 1mg (35mml) s70mingual every 4 hours as needed for anxiety: Disp 72ml - 25mol 2mg/ml s36mtion: 0.5mg (0.2581m subl69mal every 4 hours as needed for agitation or nausea: Disp 72ml    Cod55matus/Advance Care Planning: DNR   Prognosis:   < 2 weeks  Discharge Planning: Home with Hospice  Primary Diagnoses: Present on Admission:  Acute pulmonary embolism (HCC)  AtrialGarlandbrillation by electrocardiogram (HCC)  GERD wSedonaout esophagitis   Review of Systems  Physical Exam  Vital Signs: BP 109/67 (BP Location: Right Arm)   Pulse 65   Temp 97.9 F (36.6 C)   Resp 16   Ht 6' (1.829 m)   Wt 89.4 kg   SpO2 99%   BMI 26.72 kg/m  Pain Scale: 0-10   Pain Score: 0-No pain   SpO2: SpO2: 99 % O2 Device:SpO2: 99 % O2 Flow Rate: .O2 Flow Rate (L/min): 0 L/min  IO: Intake/output summary:  Intake/Output Summary (Last 24 hours) at 04/18/2022 1424 Last data filed at 04/18/2022 0523 Gross per 24 hour  Intake --  Output 400 ml  Net -400 ml    LBM: Last BM Date : 04/14/22 Baseline Weight: Weight: 89.4 kg Most recent weight: Weight: 89.4 kg       Thank you for this consult. Palliative medicine will continue to follow and assist as needed.  Time Total: 120 minutes Greater than 50%  of this time was spent counseling and coordinating care related to the above assessment and plan.  Signed by: Chanti Golubski,[facility]    Please contact Palliative Medicine Team phone at [phone] [phone] and concerns.  For individual provider: See AmionShea Evans

## 2022-04-18 NOTE — Progress Notes (Signed)
Tele called and pulse had been staying in 30's.  Bp now 110/62 pulse 58 on monitor.  Contacted Dr. Manuella Ghazi

## 2022-04-18 NOTE — TOC Progression Note (Addendum)
Transition of Care [facility]) - Progression Note    Patient Details  Name: Bernard Ramirez MRN: [id number] Date of Birth: 30-Apr-1931  [facility] CM/SW Contact  Salome Arnt, [place] Phone Number: 04/18/2022, 1:07 PM  Clinical Narrative:  Per palliative and MD, pt's wife would like to take pt home with hospice. Discussed with wife who requests [facility]. Referral made.    Update: Hospice spoke with wife and requests pt be d/c in AM and they will arrange transport for around 1:00. MD updated.        Expected Discharge Plan and Services                                                 Social Determinants of Health (SDOH) Interventions    Readmission Risk Interventions    02/02/2021    2:10 PM  Readmission Risk Prevention Plan  Medication Screening Complete  Transportation Screening Complete

## 2022-04-18 NOTE — Progress Notes (Addendum)
This morning was awake and talking, stated name and birthdate and answered simple questions.  Made some statements that did not make sense such as wanting his wife to bring a knife., but then when asked favorite football team said that he didn't have one.  Ate some breakfast but not much.  Now patient sleeping and opens eyes with stimulation. Unable to give oral protonix.  Mouth swabbed.

## 2022-04-19 ENCOUNTER — Telehealth: Payer: Self-pay

## 2022-04-19 DIAGNOSIS — E8809 Other disorders of plasma-protein metabolism, not elsewhere classified: Secondary | ICD-10-CM | POA: Diagnosis not present

## 2022-04-19 DIAGNOSIS — J690 Pneumonitis due to inhalation of food and vomit: Secondary | ICD-10-CM | POA: Diagnosis not present

## 2022-04-19 DIAGNOSIS — I2699 Other pulmonary embolism without acute cor pulmonale: Secondary | ICD-10-CM | POA: Diagnosis not present

## 2022-04-19 DIAGNOSIS — I4891 Unspecified atrial fibrillation: Secondary | ICD-10-CM | POA: Diagnosis not present

## 2022-04-19 DIAGNOSIS — Z515 Encounter for palliative care: Secondary | ICD-10-CM

## 2022-04-19 DIAGNOSIS — I639 Cerebral infarction, unspecified: Secondary | ICD-10-CM | POA: Diagnosis not present

## 2022-04-19 LAB — BASIC METABOLIC PANEL
Anion gap: 8 (ref 5–15)
BUN: 13 mg/dL (ref 8–23)
CO2: 25 mmol/L (ref 22–32)
Calcium: 8.4 mg/dL — ABNORMAL LOW (ref 8.9–10.3)
Chloride: 109 mmol/L (ref 98–111)
Creatinine, Ser: 0.59 mg/dL — ABNORMAL LOW (ref 0.61–1.24)
GFR, Estimated: >60 mL/min (ref >60)
Glucose, Bld: 108 mg/dL — ABNORMAL HIGH (ref 70–99)
Potassium: 3.8 mmol/L (ref 3.5–5.1)
Sodium: 142 mmol/L (ref 135–145)

## 2022-04-19 LAB — CBC
HCT: 34.3 % — ABNORMAL LOW (ref 39.0–52.0)
Hemoglobin: 10.1 g/dL — ABNORMAL LOW (ref 13.0–17.0)
MCH: 24.5 pg — ABNORMAL LOW (ref 26.0–34.0)
MCHC: 29.4 g/dL — ABNORMAL LOW (ref 30.0–36.0)
MCV: 83.3 fL (ref 80.0–100.0)
Platelets: 227 10*3/uL (ref 150–400)
RBC: 4.12 MIL/uL — ABNORMAL LOW (ref 4.22–5.81)
RDW: 15 % (ref 11.5–15.5)
WBC: 5.4 10*3/uL (ref 4.0–10.5)
nRBC: 0 % (ref 0.0–0.2)

## 2022-04-19 LAB — LEGIONELLA PNEUMOPHILA SEROGP 1 UR AG: L. pneumophila Serogp 1 Ur Ag: NEGATIVE

## 2022-04-19 LAB — MAGNESIUM: Magnesium: 2 mg/dL (ref 1.7–2.4)

## 2022-04-19 MED ORDER — MORPHINE SULFATE (CONCENTRATE) 20 MG/ML PO SOLN: 5.0000 mg | ORAL | 0 refills | Status: DC | PRN

## 2022-04-19 MED ORDER — APIXABAN 5 MG PO TABS: ORAL_TABLET | ORAL | 2 refills | Status: DC

## 2022-04-19 MED ORDER — HALOPERIDOL LACTATE 2 MG/ML PO CONC
0.6000 mg | ORAL | 0 refills | Status: DC | PRN
Start: 2022-04-19 — End: 2023-12-13

## 2022-04-19 MED ORDER — LORAZEPAM 2 MG/ML PO CONC: 0.6000 mg | ORAL | 0 refills | Status: DC | PRN

## 2022-04-19 NOTE — Discharge Instructions (Signed)
Information on my medicine - ELIQUIS (apixaban)  This medication education was reviewed with me or my healthcare representative as part of my discharge preparation.    Why was Eliquis prescribed for you? Eliquis was prescribed to treat blood clots that may have been found in the veins of your legs (deep vein thrombosis) or in your lungs (pulmonary embolism) and to reduce the risk of them occurring again.  What do You need to know about Eliquis ? The starting dose is 10 mg (two 5 mg tablets) taken TWICE daily for the FIRST SEVEN (7) DAYS, then on 04/25/2022 the dose is reduced to ONE 5 mg tablet taken TWICE daily.  Eliquis may be taken with or without food.   Try to take the dose about the same time in the morning and in the evening. If you have difficulty swallowing the tablet whole please discuss with your pharmacist how to take the medication safely.  Take Eliquis exactly as prescribed and DO NOT stop taking Eliquis without talking to the doctor who prescribed the medication.  Stopping may increase your risk of developing a new blood clot.  Refill your prescription before you run out.  After discharge, you should have regular check-up appointments with your healthcare provider that is prescribing your Eliquis.    What do you do if you miss a dose? If a dose of ELIQUIS is not taken at the scheduled time, take it as soon as possible on the same day and twice-daily administration should be resumed. The dose should not be doubled to make up for a missed dose.  Important Safety Information A possible side effect of Eliquis is bleeding. You should call your healthcare provider right away if you experience any of the following: Bleeding from an injury or your nose that does not stop. Unusual colored urine (red or dark brown) or unusual colored stools (red or black). Unusual bruising for unknown reasons. A serious fall or if you hit your head (even if there is no bleeding).  Some  medicines may interact with Eliquis and might increase your risk of bleeding or clotting while on Eliquis. To help avoid this, consult your healthcare provider or pharmacist prior to using any new prescription or non-prescription medications, including herbals, vitamins, non-steroidal anti-inflammatory drugs (NSAIDs) and supplements.  This website has more information on Eliquis (apixaban): http://www.eliquis.com/eliquis/home

## 2022-04-19 NOTE — Progress Notes (Signed)
Pt has discharge orders, discharge teaching given to pts wife bedside. [facility] is here to pick up pt to transport home.

## 2022-04-19 NOTE — TOC Transition Note (Signed)
Transition of Care [facility]) - CM/SW Discharge Note   Patient Details  Name: Bernard Ramirez MRN: [id number] Date of Birth: 1931-05-09  Transition of [facility]) CM/SW Contact:  Iona Beard, [place] Phone Number: 04/19/2022, 10:39 AM   Clinical Narrative:    CSW spoke to [facility] with Hospice who is working to confirm that EMS has been set up for transport home at 12pm today. Keka states Hospice has spoken with pts wife to update on transport time. TOC to update MD and RN of time for EMS transport. TOC signing off.   Final next level of care: Home w Hospice Care Barriers to Discharge: Barriers Resolved   Patient Goals and CMS Choice Patient states their goals for this hospitalization and ongoing recovery are:: Return home with hospice CMS Medicare.gov Compare Post Acute Care list provided to:: Patient Represenative (must comment) Choice offered to / list presented to : Patient, Spouse  Discharge Placement                       Discharge Plan and Services                                     Social Determinants of Health (SDOH) Interventions     Readmission Risk Interventions    02/02/2021    2:10 PM  Readmission Risk Prevention Plan  Medication Screening Complete  Transportation Screening Complete

## 2022-04-19 NOTE — Telephone Encounter (Signed)
Pt's wife called in stating that pt was recently admitted to [facility]. Pt's wife stated that pt will be discharged with Hospice. Pt's wife stated that per pt's discharge instructions pt's has to make an appt with pcp. Pt's wife wanted to know if pcp would be willing to come out again to see pt. Pt's wife would like to receive a cb from either pcp or nurse if this is possible please.  Cb#: [phone]

## 2022-04-19 NOTE — Discharge Summary (Signed)
Physician Discharge Summary   Patient: Bernard Ramirez MRN: [id number] DOB: 28-Sep-1930  Admit date:     04/16/2022  Discharge date: 04/19/22  Discharge Physician: Barton Dubois   PCP: [name], MD   Recommendations at discharge:  Comfort care and symptomatic management. Patient discharged home with hospice care.  Discharge Diagnoses: Principal Problem:   Acute pulmonary embolism ([place]) Active Problems:   Hyperglycemia   Atrial fibrillation by electrocardiogram (HCC)   GERD without esophagitis   Mixed hyperlipidemia   CVA (cerebral vascular accident) ([place])   Aspiration pneumonia ([place])   Hypoalbuminemia due to protein-calorie malnutrition [facility])   Prediabetes  Brief Hospital admission course: As per H&P written by Dr. Josephine Cables on 04/16/2022. Bernard Ramirez is a 88 y.o. male with medical history significant of hypertension,  CVA, A-fib not on anticoagulation, hypertension, GERD, hyperlipidemia who presents to the emergency department via EMS due to increased work of breathing within the last 2 days.  Patient became nonverbal within the last 2 weeks per wife at bedside, she was unable to provide history, most of the history was obtained from ED physician and wife at bedside.  Per wife, patient had a stroke in April (admitted from 4/4 to 4/10 at [facility]).  He was discharged to a SNF for rehabilitation, however, wife took patient out of the facility after about 6 weeks and took him home.  Patient was bedbound and was dependent on wife for ADLs including feeding.  He had pneumonia prior to being discharged from the SNF and was treated with Augmentin by his physician.  He has had cough with occasional production of clear thick phlegm for about a week.  Intermittent increased work of breathing was noted within last 2 days and this worsened this morning, so EMS was activated and patient was sent to the ED for further evaluation and management.  Of note, patient has chronic right arm  weakness compared to left and bilateral lower extremity weakness which he could barely move.   ED Course:  In the emergency department, he was tachypneic, but other vital signs were within normal range.  Work-up in the ED shows normocytic anemia and normal BMP except for hyperglycemia.  Albumin 2.7, ammonia 31, troponin x115, D-dimer 3.17.  SARS coronavirus 2 was negative.  Blood culture pending. CT angiography of the chest with contrast showed Right lower lobe segmental and subsegmental pulmonary emboli. Positive for acute PE with CTevidence of right heart strain (RV/LV Ratio = 1.1) consistent with at least submassive (intermediate risk) PE. 2. There secretions in the right lower lobe bronchus which may be related to aspiration. 3. Minimal ground-glass and patchy airspace opacities in the right lower lobe may represent pulmonary infarcts and/or infection including aspiration pneumonia given the above findings. 4. Cardiomegaly.  CT head without contrast showed no acute intracranial abnormalities Chest x-ray showed no evidence of acute cardiopulmonary disease.  Chronic right basilar atelectasis/scarring Patient was treated with Benadryl, Solu-Medrol, IV hydration was provided and patient was started on heparin drip.  IV antibiotics was started due to suspicion for aspiration pneumonia. Hospitalist was asked to admit patient for further evaluation and management.  Assessment and Plan: Acute pulmonary embolism/left DVT Patient presented with increasing shortness of breath CT angiography chest with contrast showed right lower lobe segmental and subsegmental pulmonary emboli with right heart strain Transitioned to Eliquis now; with intention to treat if able to take p.o.'s. Bilateral lower extremity ultrasound with left femoral vein DVT Echocardiogram without RV strain LVEF 55-60%  Possible aspiration pneumonia with acute metabolic encephalopathy Patient was started on Unasyn we shall continue  same at this time with plan to de-escalate/discontinue based on blood culture, sputum culture, urine Legionella, strep pneumo negative Continue incentive spirometry, flutter valve Continue aspiration precaution Continue bedside swallow eval Continue SLP eval deferred as plan is for hospice on DC   Hypoalbuminemia possibly secondary to moderate protein calorie malnutrition/failure to thrive Albumin 2.7, significant decrease in oral intake in the setting of acute illness and further decompensation. -At discharge essentially not tolerating oral meds and nutrition -Focusing on comfort feeding.   Hypokalemia Repleted Planning to pursue hospice care at home; no further blood work anticipated.   Hyperglycemia due to prediabetes CBG 189, hemoglobin A1c about 4 months ago was 6.3 Patient discharged home with hospice care and symptomatic management only.   Essential hypertension -Overall stable -Focusing on comfort care and symptomatic management only -No meds for blood pressure prescribed at discharge.   Mixed hyperlipidemia -Main goal symptomatic management and comfort care -Demonstrating difficulty to tolerate oral meds -Statin discontinued this point.   GERD -Continue PPI.   Chronic atrial fibrillation -CHA2DS2-VASc score of at least 6 -Patient has not required the use of rate control agents -Planning to use Eliquis for secondary prevention if able to tolerate p.o.'s -He is going home with hospice care.   CVA -With underlying dysphagia high risk for aspiration -Patient with aspiration pneumonia -Now comfort feeding and symptomatic management only -Patient is DNR/DNI and planning to pursuit hospice care at home. -Anticoagulation/secondary prevention given presence of PE as well will be attempted with oral Eliquis only if able to tolerate by mouth.   Consultants: Palliative care/hospice Procedures performed: See below for x-ray reports. Disposition: Home with hospice. Diet  recommendation: Comfort feeding.  DISCHARGE MEDICATION: Allergies as of 04/19/2022       Reactions   Contrast Media [iodinated Contrast Media]    Pt states he was given "dye" 20 yrs ago, anaphylaxis & syncope - was told to never have it again    Gabapentin Itching   Aspirin Other (See Comments)   Blistering   Latex Rash   Lipitor [atorvastatin] Itching   Tape Rash   EKG leads        Medication List     STOP taking these medications    B-12 Compliance Injection 1000 MCG/ML Kit Generic drug: Cyanocobalamin   clopidogrel 75 MG tablet Commonly known as: PLAVIX   gabapentin 300 MG capsule Commonly known as: NEURONTIN   HYDROcodone-acetaminophen 10-325 MG tablet Commonly known as: Norco   losartan 100 MG tablet Commonly known as: COZAAR   pravastatin 20 MG tablet Commonly known as: PRAVACHOL       TAKE these medications    apixaban 5 MG Tabs tablet Commonly known as: ELIQUIS Take 2 tablets by mouth twice a day for 7 days (until 04/24/2022); then start taking 1 tablet by mouth twice a day moving forward (starting date 04/25/2022).   haloperidol 2 MG/ML solution Commonly known as: HALDOL Take 0.3 mLs (0.6 mg total) by mouth every 4 (four) hours as needed for agitation (and/or nausea).   LORazepam 2 MG/ML concentrated solution Commonly known as: LORazepam Intensol Take 0.3 mLs (0.6 mg total) by mouth every 4 (four) hours as needed for anxiety.   morphine 20 MG/ML concentrated solution Commonly known as: ROXANOL Take 0.25 mLs (5 mg total) by mouth every 2 (two) hours as needed for severe pain, shortness of breath or moderate pain. May give sublingually if  needed.   pantoprazole 40 MG tablet Commonly known as: PROTONIX Take 1 tablet (40 mg total) by mouth daily.        Follow-up Information     [name], MD. Schedule an appointment as soon as possible for a visit in 1 week(s).   Specialty: Family Medicine Contact information: [address] [place] [address] [phone]                Discharge Exam: Danley Danker Weights   04/16/22 1359  Weight: 89.4 kg   General exam: Lethargic; no much interaction or response on today's examination.  Hemodynamically stable, good saturation on room air and without fevers. Respiratory system: No using accessory muscles; positive rhonchi bilaterally. Cardiovascular system: Rate controlled; no rubs or gallops appreciated on exam.  Telemetry evaluation demonstrating irregular rhythm. Gastrointestinal system: Abdomen is nondistended, soft and nontender. No organomegaly or masses felt. Normal bowel sounds heard. Central nervous system: Unable to properly assess with current mentation. Extremities: No cyanosis or clubbing. Skin: No petechiae. Psychiatry: Stable mood.   Condition at discharge: Hemodynamically stable; no acute distress.  The results of significant diagnostics from this hospitalization (including imaging, microbiology, ancillary and laboratory) are listed below for reference.   Imaging Studies: ECHOCARDIOGRAM COMPLETE  Result Date: 04/17/2022    ECHOCARDIOGRAM REPORT   Patient Name:   JAYCEN VERCHER Date of Exam: 04/17/2022 Medical Rec #:  [id number]        Height:       72.0 in Accession #:    [id number]       Weight:       197.0 lb Date of Birth:  [birth year]         BSA:          2.117 m Patient Age:    88 years         BP:           121/63 mmHg Patient Gender: M                HR:           85 bpm. Exam Location:  [facility] Procedure: 2D Echo, Cardiac Doppler and Color Doppler Indications:    Pulmonary Embolus I26.09  History:        Patient has prior history of Echocardiogram examinations, most                 recent 11/23/2021. CHF, Stroke, Arrythmias:Atrial Fibrillation;                 Risk Factors:Hypertension, Dyslipidemia and Prediabetes.                 Obstructive sleep apnea (From Hx).  Sonographer:    Alvino Chapel RCS Referring Phys: [id number] OLADAPO ADEFESO  IMPRESSIONS  1. Left ventricular ejection fraction, by estimation, is 55 to 60%. The left ventricle has normal function. The left ventricle has no regional wall motion abnormalities. There is mild left ventricular hypertrophy. Left ventricular diastolic parameters are indeterminate.  2. Right ventricular systolic function is normal. The right ventricular size is mildly enlarged.  3. Left atrial size was severely dilated.  4. Right atrial size was severely dilated.  5. The mitral valve is normal in structure. No evidence of mitral valve regurgitation. No evidence of mitral stenosis.  6. Tricuspid valve regurgitation is mild to moderate.  7. The aortic valve is tricuspid. There is moderate calcification of the aortic valve. There is moderate  thickening of the aortic valve. Aortic valve regurgitation is not visualized. Aortic valve sclerosis/calcification is present, without any evidence of aortic stenosis. Aortic valve mean gradient measures 8.0 mmHg. Aortic valve Vmax measures 1.80 m/s.  8. The inferior vena cava is dilated in size with <50% respiratory variability, suggesting right atrial pressure of 15 mmHg. Comparison(s): No significant change from prior study. Prior images reviewed side by side. FINDINGS  Left Ventricle: Left ventricular ejection fraction, by estimation, is 55 to 60%. The left ventricle has normal function. The left ventricle has no regional wall motion abnormalities. The left ventricular internal cavity size was normal in size. There is  mild left ventricular hypertrophy. Left ventricular diastolic parameters are indeterminate. Right Ventricle: The right ventricular size is mildly enlarged. No increase in right ventricular wall thickness. Right ventricular systolic function is normal. Left Atrium: Left atrial size was severely dilated. Right Atrium: Right atrial size was severely dilated. Pericardium: There is no evidence of pericardial effusion. Mitral Valve: The mitral valve is normal in  structure. No evidence of mitral valve regurgitation. No evidence of mitral valve stenosis. Tricuspid Valve: The tricuspid valve is normal in structure. Tricuspid valve regurgitation is mild to moderate. No evidence of tricuspid stenosis. Aortic Valve: The aortic valve is tricuspid. There is moderate calcification of the aortic valve. There is moderate thickening of the aortic valve. Aortic valve regurgitation is not visualized. Aortic valve sclerosis/calcification is present, without any  evidence of aortic stenosis. Aortic valve mean gradient measures 8.0 mmHg. Aortic valve peak gradient measures 13.0 mmHg. Aortic valve area, by VTI measures 1.45 cm. Pulmonic Valve: The pulmonic valve was normal in structure. Pulmonic valve regurgitation is trivial. No evidence of pulmonic stenosis. Aorta: The aortic root is normal in size and structure. Venous: The inferior vena cava is dilated in size with less than 50% respiratory variability, suggesting right atrial pressure of 15 mmHg. IAS/Shunts: No atrial level shunt detected by color flow Doppler.  LEFT VENTRICLE PLAX 2D LVIDd:         4.70 cm LVIDs:         2.90 cm LV PW:         1.30 cm LV IVS:        1.30 cm LVOT diam:     2.00 cm LV SV:         58 LV SV Index:   28 LVOT Area:     3.14 cm  RIGHT VENTRICLE TAPSE (M-mode): 1.5 cm LEFT ATRIUM              Index        RIGHT ATRIUM           Index LA diam:        3.40 cm  1.61 cm/m   RA Area:     22.50 cm LA Vol (A2C):   51.2 ml  24.19 ml/m  RA Volume:   62.80 ml  29.67 ml/m LA Vol (A4C):   118.0 ml 55.74 ml/m LA Biplane Vol: 78.7 ml  37.18 ml/m  AORTIC VALVE AV Area (Vmax):    1.57 cm AV Area (Vmean):   1.37 cm AV Area (VTI):     1.45 cm AV Vmax:           180.00 cm/s AV Vmean:          134.000 cm/s AV VTI:            0.404 m AV Peak Grad:      13.0 mmHg  AV Mean Grad:      8.0 mmHg LVOT Vmax:         90.00 cm/s LVOT Vmean:        58.400 cm/s LVOT VTI:          0.186 m LVOT/AV VTI ratio: 0.46  AORTA Ao Root diam:  3.90 cm MITRAL VALVE               TRICUSPID VALVE MV Area (PHT): 4.31 cm    TR Peak grad:   27.9 mmHg MV Decel Time: 176 msec    TR Vmax:        264.00 cm/s MV E velocity: 96.30 cm/s                            SHUNTS                            Systemic VTI:  0.19 m                            Systemic Diam: 2.00 cm Candee Furbish MD Electronically signed by Candee Furbish MD Signature Date/Time: 04/17/2022/3:50:43 PM    Final    US Venous Img Lower Bilateral (DVT)  Result Date: 04/17/2022 CLINICAL DATA:  Shortness of breath EXAM: BILATERAL LOWER EXTREMITY VENOUS DOPPLER ULTRASOUND TECHNIQUE: Gray-scale sonography with graded compression, as well as color Doppler and duplex ultrasound were performed to evaluate the lower extremity deep venous systems from the level of the common femoral vein and including the common femoral, femoral, profunda femoral, popliteal and calf veins including the posterior tibial, peroneal and gastrocnemius veins when visible. The superficial great saphenous vein was also interrogated. Spectral Doppler was utilized to evaluate flow at rest and with distal augmentation maneuvers in the common femoral, femoral and popliteal veins. COMPARISON:  None Available. FINDINGS: RIGHT LOWER EXTREMITY Common Femoral Vein: No evidence of thrombus. Normal compressibility, respiratory phasicity and response to augmentation. Saphenofemoral Junction: No evidence of thrombus. Normal compressibility and flow on color Doppler imaging. Profunda Femoral Vein: No evidence of thrombus. Normal compressibility and flow on color Doppler imaging. Femoral Vein: No evidence of thrombus. Normal compressibility, respiratory phasicity and response to augmentation. Popliteal Vein: No evidence of thrombus. Normal compressibility, respiratory phasicity and response to augmentation. Calf Veins: Limited visualization, within limitations no evidence of thrombus. Normal compressibility and flow on color Doppler imaging. Superficial  Great Saphenous Vein: No evidence of thrombus. Normal compressibility. Venous Reflux:  None. Other Findings:  None. LEFT LOWER EXTREMITY Common Femoral Vein: No evidence of thrombus. Normal compressibility and flow on color Doppler imaging. Saphenofemoral Junction: No evidence of thrombus. Normal compressibility and flow on color Doppler imaging. Profunda Femoral Vein: No evidence of thrombus. Normal compressibility and flow on color Doppler imaging. Femoral Vein: Nonocclusive thrombus is seen in the proximal left femoral vein at the area of the bifurcation with the profunda. Popliteal Vein: No evidence of thrombus. Normal compressibility, respiratory phasicity and response to augmentation. Calf Veins: No evidence of thrombus. Normal compressibility and flow on color Doppler imaging. Superficial Great Saphenous Vein: No evidence of thrombus. Normal compressibility. Venous Reflux:  None. Other Findings:  None. IMPRESSION: 1. Nonocclusive thrombus is seen in the proximal left femoral vein. 2. No evidence of right lower extremity DVT. Electronically Signed   By: Yetta Glassman M.D.   On: 04/17/2022 10:42  CT Angio Chest PE W and/or Wo Contrast  Result Date: 04/16/2022 CLINICAL DATA:  Positive D-dimer. EXAM: CT ANGIOGRAPHY CHEST WITH CONTRAST TECHNIQUE: Multidetector CT imaging of the chest was performed using the standard protocol during bolus administration of intravenous contrast. Multiplanar CT image reconstructions and MIPs were obtained to evaluate the vascular anatomy. RADIATION DOSE REDUCTION: This exam was performed according to the departmental dose-optimization program which includes automated exposure control, adjustment of the mA and/or kV according to patient size and/or use of iterative reconstruction technique. CONTRAST:  16m OMNIPAQUE IOHEXOL 350 MG/ML SOLN COMPARISON:  None Available. FINDINGS: Cardiovascular: Aorta is normal in size. The heart is mildly enlarged. There is adequate  opacification of the pulmonary arteries. Pulmonary emboli are seen within segmental and subsegmental branches of the right lower lobe. Mediastinum/Nodes: No enlarged mediastinal, hilar, or axillary lymph nodes. Thyroid gland, trachea, and esophagus demonstrate no significant findings. There are calcified subcarinal lymph nodes likely related to old granulomatous disease. Lungs/Pleura: There are minimal patchy airspace and ground-glass opacities in the inferior right lower lobe. There is some secretions within the right lower lobe bronchus. There is a trace right pleural effusion. There is a 10 mm pleural-based nodule in the left upper lobe image 7/23. Upper Abdomen: Cholecystectomy clips are present. Musculoskeletal: No chest wall abnormality. No acute or significant osseous findings. Review of the MIP images confirms the above findings. IMPRESSION: 1. Right lower lobe segmental and subsegmental pulmonary emboli. Positive for acute PE with CTevidence of right heart strain (RV/LV Ratio = 1.1) consistent with at least submassive (intermediate risk) PE. The presence of right heart strain has been associated with an increased risk of morbidity and mortality. 2. There secretions in the right lower lobe bronchus which may be related to aspiration. 3. Minimal ground-glass and patchy airspace opacities in the right lower lobe may represent pulmonary infarcts and/or infection including aspiration pneumonia given the above findings. 4. Cardiomegaly. Electronically Signed   By: ARonney AstersM.D.   On: 04/16/2022 20:54   CT Head Wo Contrast  Result Date: 04/16/2022 CLINICAL DATA:  Mental status changes of unknown EXAM: CT HEAD WITHOUT CONTRAST TECHNIQUE: Contiguous axial images were obtained from the base of the skull through the vertex without intravenous contrast. RADIATION DOSE REDUCTION: This exam was performed according to the departmental dose-optimization program which includes automated exposure control, adjustment  of the mA and/or kV according to patient size and/or use of iterative reconstruction technique. COMPARISON:  01/20/2022 FINDINGS: Brain: Generalized atrophy. Normal ventricular morphology. No midline shift or mass effect. Small vessel chronic ischemic changes of deep cerebral white matter. Old inferior RIGHT cerebellar and RIGHT parieto-occipital infarcts. Old inferior LEFT frontal and LEFT occipital infarcts. No intracranial hemorrhage, mass lesion, or evidence of acute infarction. No extra-axial fluid collections Vascular: No hyperdense vessels. Atherosclerotic calcifications of internal carotid arteries at skull base. Skull: Intact Cause Sinuses/Orbits: Clear Other: N/A IMPRESSION: Atrophy with small vessel chronic ischemic changes of deep cerebral white matter. Multiple old infarcts as above. No acute intracranial abnormalities. Electronically Signed   By: MLavonia DanaM.D.   On: 04/16/2022 15:54   DG Chest Portable 1 View  Result Date: 04/16/2022 CLINICAL DATA:  Weakness and altered mental status. EXAM: PORTABLE CHEST 1 VIEW COMPARISON:  01/20/2022 and prior radiographs FINDINGS: The cardiomediastinal silhouette is unremarkable. Elevated RIGHT hemidiaphragm and RIGHT basilar atelectasis/scarring again noted. There is no evidence of focal airspace disease, pulmonary edema, suspicious pulmonary nodule/mass, pleural effusion, or pneumothorax. No acute bony abnormalities are identified. IMPRESSION:  No evidence of acute cardiopulmonary disease. Chronic RIGHT basilar atelectasis/scarring. Electronically Signed   By: Margarette Canada M.D.   On: 04/16/2022 14:33    Microbiology: Results for orders placed or performed during the hospital encounter of 04/16/22  SARS Coronavirus 2 by RT PCR (hospital order, performed in [facility] hospital lab) *cepheid single result test* Anterior Nasal Swab     Status: None   Collection Time: 04/16/22  2:18 PM   Specimen: Anterior Nasal Swab  Result Value Ref Range Status   SARS  Coronavirus 2 by RT PCR NEGATIVE NEGATIVE Final    Comment: (NOTE) SARS-CoV-2 target nucleic acids are NOT DETECTED.  The SARS-CoV-2 RNA is generally detectable in upper and lower respiratory specimens during the acute phase of infection. The lowest concentration of SARS-CoV-2 viral copies this assay can detect is 250 copies / mL. A negative result does not preclude SARS-CoV-2 infection and should not be used as the sole basis for treatment or other patient management decisions.  A negative result may occur with improper specimen collection / handling, submission of specimen other than nasopharyngeal swab, presence of viral mutation(s) within the areas targeted by this assay, and inadequate number of viral copies (<250 copies / mL). A negative result must be combined with clinical observations, patient history, and epidemiological information.  Fact Sheet for Patients:   https://www.patel.info/  Fact Sheet for Healthcare Providers: https://hall.com/  This test is not yet approved or  cleared by the [place] FDA and has been authorized for detection and/or diagnosis of SARS-CoV-2 by FDA under an Emergency Use Authorization (EUA).  This EUA will remain in effect (meaning this test can be used) for the duration of the COVID-19 declaration under Section 564(b)(1) of the Act, 21 U.S.C. section 360bbb-3(b)(1), unless the authorization is terminated or revoked sooner.  Performed at [facility], [address] [place], [place] [address]   Culture, blood (routine x 2)     Status: None (Preliminary result)   Collection Time: 04/16/22  3:38 PM   Specimen: Left Antecubital; Blood  Result Value Ref Range Status   Specimen Description LEFT ANTECUBITAL  Final   Special Requests   Final    BOTTLES DRAWN AEROBIC AND ANAEROBIC Blood Culture adequate volume   Culture   Final    NO GROWTH 3 DAYS Performed at [facility], [address],  [place], [place] [address]    Report Status PENDING  Incomplete  Culture, blood (routine x 2)     Status: None (Preliminary result)   Collection Time: 04/16/22  9:49 PM   Specimen: BLOOD RIGHT FOREARM  Result Value Ref Range Status   Specimen Description BLOOD RIGHT FOREARM  Final   Special Requests   Final    BOTTLES DRAWN AEROBIC AND ANAEROBIC Blood Culture adequate volume   Culture   Final    NO GROWTH 3 DAYS Performed at [facility], [address], [place], [place] [address]    Report Status PENDING  Incomplete    Labs: CBC: Recent Labs  Lab 04/16/22 1418 04/17/22 0619 04/18/22 0342 04/19/22 0246  WBC 7.1 4.8 7.5 5.4  NEUTROABS 4.8  --   --   --   HGB 12.2* 12.4* 11.5* 10.1*  HCT 41.1 40.9 38.1* 34.3*  MCV 84.0 82.6 83.0 83.3  PLT 245 248 262 481   Basic Metabolic Panel: Recent Labs  Lab 04/16/22 1418 04/17/22 1348 04/18/22 0342 04/19/22 0246  NA 139 140 140 142  K 4.1 4.3 3.3* 3.8  CL 102 103 106 109  CO2 _0 GLUCOSE 189* 127* 110* 108*  BUN _1 CREATININE 0.64 0.63 0.62 0.59*  CALCIUM 9.1 9.1 8.8* 8.4*  MG  --  2.1 2.0 2.0  PHOS  --  3.0  --   --    Liver Function Tests: Recent Labs  Lab 04/16/22 1418 04/17/22 1348  AST 18 20  ALT 14 14  ALKPHOS 96 98  BILITOT 0.7 0.8  PROT 6.4* 6.7  ALBUMIN 2.7* 2.8*    Discharge time spent: greater than 30 minutes.  Signed: Barton Dubois, MD [facility] 04/19/2022

## 2022-04-19 NOTE — Progress Notes (Addendum)
Daily Progress Note   Patient Name: Bernard Ramirez       Date: 04/19/2022 DOB: 1930-08-28  Age: 88 y.o. MRN#: [id number] Attending Physician: Barton Dubois, MD Primary Care Physician: [name], MD Admit Date: 04/16/2022  Reason for Consultation/Follow-up: Establishing goals of care  Patient Profile/HPI:   88 y.o. male  with past medical history of debilitating CVA in April, prostate cancer with mets to his bones, A-fib admitted on 04/16/2022 with acute PE, DVT, aspiration pneumonia.  He had just been discharged from SNF where he was treated for pneumonia there as well.  So this is a recurrent pneumonia.  Palliative medicine consulted for goals of care  Subjective: Chart reviewed including progress notes and labs. Patient is in bed he is sleeping.  Spouse, Isabel Caprice, is at bedside.  Arrangements have been made for him to discharge home with hospice.  She has been in touch with hospice this morning and the plan is for him to transport home somewhere around 12 or 1. Mr. Sexson appears comfortable.  He opened his eyes during our conversation but did not speak.  He did not track when I was at the bedside he did not answer any of my questions or follow any commands.  Review of Systems  Unable to perform ROS: Mental status change     Physical Exam Vitals and nursing note reviewed.  Constitutional:      Appearance: He is ill-appearing.  Cardiovascular:     Rate and Rhythm: Normal rate.  Pulmonary:     Effort: Pulmonary effort is normal.     Comments: Respirations are shallow and even Musculoskeletal:     Comments: Loss of jaw motor control  Skin:    Coloration: Skin is pale.  Neurological:     Comments: Nonverbal             Vital Signs: BP 124/88 (BP Location: Right Arm)    Pulse 68   Temp 98.6 F (37 C) (Axillary)   Resp 19   Ht 6' (1.829 m)   Wt 89.4 kg   SpO2 97%   BMI 26.72 kg/m  SpO2: SpO2: 97 % O2 Device: O2 Device: Room Air O2 Flow Rate: O2 Flow Rate (L/min): 0 L/min  Intake/output summary:  Intake/Output Summary (Last 24 hours) at 04/19/2022 1204 Last data  filed at 04/19/2022 0424 Gross per 24 hour  Intake --  Output 550 ml  Net -550 ml   LBM: Last BM Date : 04/14/22 Baseline Weight: Weight: 89.4 kg Most recent weight: Weight: 89.4 kg       Palliative Assessment/Data:  PPS: 20%      Patient Active Problem List   Diagnosis Date Noted   Acute pulmonary embolism ([place]) 04/16/2022   Aspiration pneumonia ([place]) 04/16/2022   Hypoalbuminemia due to protein-calorie malnutrition ([place]) 04/16/2022   Prediabetes 04/16/2022   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, psychotic disturbance, mood disturbance, or anxiety ([place]) 02/18/2022   Goals of care, counseling/discussion 02/18/2022   Vitamin B12 deficiency 01/19/2022   CVA (cerebral vascular accident) ([place]) 11/22/2021   Hypothermia 11/22/2021   Sinus bradycardia 11/22/2021   GERD without esophagitis 02/18/2021   Hypokalemia 02/18/2021   Mixed hyperlipidemia 02/18/2021   Hypertensive heart disease with congestive heart failure ([place]) 02/18/2021   Snoring 02/08/2021   Failure to thrive in adult    Social problem 02/01/2021   Moderate protein-calorie malnutrition ([place]) 02/01/2021   Anxiety 02/01/2021   Congestive heart failure (CHF) ([place]) 10/08/2016   Acute renal insufficiency due to procedure 04/13/2016   BPH (benign prostatic hypertrophy) with urinary obstruction 04/11/2016   Shortness of breath 09/27/2015   Absolute anemia    ED (erectile dysfunction) of organic origin 11/06/2014   Benign non-nodular prostatic hyperplasia with lower urinary tract symptoms 11/06/2014   Increased frequency of urination 10/12/2014   Post-traumatic bulbous urethral stricture 10/12/2014    Atrial fibrillation by electrocardiogram ([place]) 11/08/2012   Former smoker 11/08/2012   Chronic kidney disease, stage II (mild) 07/11/2012   Hyperglycemia 07/11/2012   Vertigo    Persistent atrial fibrillation ([place])    Anemia 06/10/2009   Essential hypertension 06/10/2009   Back pain 06/10/2009   DIARRHEA, CHRONIC 06/10/2009   DIVERTICULITIS, HX OF 06/10/2009    Palliative Care Assessment & Plan    Assessment/Recommendations/Plan  Plan for discharge home with hospice Goal DNR form signed and placed on his paper chart On discharge, would recommend scripts for: - Morphine Concentrate '10mg'$ /0.51m: '5mg'$  (0.237m sublingual every 1 hour as needed for pain or shortness of breath: Disp 3066m Lorazepam '2mg'$ /ml concentrated solution: '1mg'$  (0.5ml19mublingual every 4 hours as needed for anxiety: Disp 30ml64maldol '2mg'$ /ml solution: 0.'5mg'$  (0.25ml)60mlingual every 4 hours as needed for agitation or nausea: Disp 30ml  63mode Status: DNR  Prognosis:  < 2 weeks  Discharge Planning: Home with Hospice  Care plan was discussed with patient's spouse  Thank you for allowing the Palliative Medicine Team to assist in the care of this patient.   Greater than 50%  of this time was spent counseling and coordinating care related to the above assessment and plan.  Marlana Mckowen MMariana KaufmanC Palliative Medicine   Please contact Palliative Medicine Team phone at [phone] and concerns.

## 2022-04-21 LAB — CULTURE, BLOOD (ROUTINE X 2)
Culture: NO GROWTH
Culture: NO GROWTH
Special Requests: ADEQUATE
Special Requests: ADEQUATE

## 2022-04-27 ENCOUNTER — Ambulatory Visit (INDEPENDENT_AMBULATORY_CARE_PROVIDER_SITE_OTHER): Admitting: Family Medicine

## 2022-04-27 DIAGNOSIS — I6340 Cerebral infarction due to embolism of unspecified cerebral artery: Secondary | ICD-10-CM

## 2022-04-27 DIAGNOSIS — I2699 Other pulmonary embolism without acute cor pulmonale: Secondary | ICD-10-CM | POA: Diagnosis not present

## 2022-04-27 DIAGNOSIS — Z7401 Bed confinement status: Secondary | ICD-10-CM

## 2022-04-27 NOTE — Progress Notes (Signed)
Subjective:    Patient ID: Bernard Ramirez, male    DOB: [birth year], 88 y.o.   MRN: [id number]  HPI  Today's visit is being conducted as a telephone visit.  The patient's wife is performing the visit.  She is currently at home.  I am currently in my office.  She consents to conduct the visit via telephone as the patient is confined to a bed and unable to come to the office.  Phone call began at 1000.  Phone call concluded at 1021. Patient was recently admitted to the hospital with acute respiratory failure secondary to pulmonary embolism.  He was also treated for aspiration pneumonia.  He has been discharged home on hospice with Eliquis.  His wife states that he is essentially noncommunicative at this point.  He sleeps most of the day.  He is not eating and drinking.  She states after a few bites he stops and does not want anymore.  She is occasionally having to give him morphine for pain but she is doing this only occasionally.  She is occasionally giving her lorazepam for anxiety.  She is only once had given Haldol for agitation.  She states that he is primarily resting comfortably.  He does not appear to be in any pain or distress.  She would like me to come out to the house to see him. Past Medical History:  Diagnosis Date   AKI (acute kidney injury) ([place]) 10/08/2016   Anemia    04/2012: H&H-10.2/31.5, MCV-77; 06/2012:12/38.9   Arthritis    Benign prostatic hypertrophy    s/p transurethral resection of the prostate   Cellulitis 02/01/2021   Chest pain    2004; with dyspnea, stress nuclear-normal EF + questionable inferior wall ischemia, normal coronary angiography;  2010-negative stress nuclear   CVA (cerebral infarction)    asymptomatic (seen on CT)   Diarrhea    h/o Hemoccult-positive stool   Dysrhythmia    Edema of both legs    GERD (gastroesophageal reflux disease)    History of blood transfusion    History of kidney stones    History of urinary tract infection    Hypertension     Jerking movements of extremities    left arm    Low back pain    Obstructive sleep apnea    pt states can not use the CPAP   Peripheral neuropathy    lower legs and feet bilat    Persistent atrial fibrillation ([place]) 2013   Asymptomatic; diagnosed in 05/2012   Pneumonia    Prostate cancer ([place]) 2017   stage 4   Shortness of breath dyspnea    pt states only develops SOB if tries to do something too quickly   Upper GI bleed 1985   1985-peptic ulcer disease; initial hemigastrectomy; subsequent Billroth II   Urinary hesitancy    Vertigo    ED evaluation-06/2012   Past Surgical History:  Procedure Laterality Date   Billroth II     CHOLECYSTECTOMY     COLONOSCOPY  Approximately 2000   COLONOSCOPY  10/2012   Colonoscopy March 2014 at [facility], difficulty exam requiring fluoroscopy and overtube. Patient developed severe bradycardia again during his procedure like he did [facility]. Multiple polyps removed which were tubular adenomas. Previously tattooed sites at 20 and 30 cm were free of any polypoid change. Left-sided diverticulosis noted.   COLONOSCOPY WITH ESOPHAGOGASTRODUODENOSCOPY (EGD)  06-17-2009   RXV:QMGQQPYP tubular adenomas and 2 tubulovillous adenomas, incomplete, 3 clips placed  ESOPHAGOGASTRODUODENOSCOPY  06/17/2009   SLF: normal/chronic gastritis (non-h pylori)   ESOPHAGOGASTRODUODENOSCOPY N/A 05/11/2015   SEG:BTDVVOH anemai due to postgastrectomy state/moderate erosive gastritis   INGUINAL HERNIA REPAIR     Left   ROTATOR CUFF REPAIR  1991   Bilateral   TRANSURETHRAL RESECTION OF PROSTATE  06/2016   TRANSURETHRAL RESECTION OF PROSTATE N/A 12/27/2012   Procedure: TRANSURETHRAL RESECTION OF THE PROSTATE (TURP);  Surgeon: Marissa Nestle, MD;  Location: [facility];  Service: Urology;  Laterality: N/A;   TRANSURETHRAL RESECTION OF PROSTATE N/A 04/11/2016   Procedure: TRANSURETHRAL RESECTION OF THE PROSTATE (TURP);  Surgeon: Irine Seal, MD;  Location: [facility];  Service:  Urology;  Laterality: N/A;   VAGOTOMY AND PYLOROPLASTY  1970s   Details of procedure uncertain; 6073X   Patient's current medication list is Benadryl only.  He has not been taking any medication for almost 6 days Allergies  Allergen Reactions   Contrast Media [Iodinated Contrast Media]     Pt states he was given "dye" 20 yrs ago, anaphylaxis & syncope - was told to never have it again    Gabapentin Itching   Aspirin Other (See Comments)    Blistering    Latex Rash   Lipitor [Atorvastatin] Itching   Tape Rash    EKG leads   Social History   Socioeconomic History   Marital status: Married    Spouse name: [name]   Number of children: 5   Years of education: Not on file   Highest education level: Not on file  Occupational History   Occupation: Retired Pensions consultant: RETIRED  Tobacco Use   Smoking status: Former    Types: Cigars    Quit date: 08/22/2003    Years since quitting: 18.6   Smokeless tobacco: Never   Tobacco comments:    Quit many years ago; few cigars per day; didn't inhale  Vaping Use   Vaping Use: Never used  Substance and Sexual Activity   Alcohol use: No    Alcohol/week: 1.0 standard drink of alcohol    Types: 1 Standard drinks or equivalent per week   Drug use: No   Sexual activity: Not on file  Other Topics Concern   Not on file  Social History Narrative   Lives w/ wife in [place]   Retired truck Geophysicist/field seismologist   Social Determinants of Health   Financial Resource Strain: [place]  (05/05/2021)   Overall Financial Resource Strain (CARDIA)    Difficulty of Paying Living Expenses: Not hard at all  Food Insecurity: No Food Insecurity (05/05/2021)   Hunger Vital Sign    Worried About Running Out of Food in the Last Year: Never true    [place] in the Last Year: Never true  Transportation Needs: No Transportation Needs (05/05/2021)   PRAPARE - Hydrologist (Medical): No    Lack of Transportation (Non-Medical):  No  Physical Activity: Inactive (05/05/2021)   Exercise Vital Sign    Days of Exercise per Week: 0 days    Minutes of Exercise per Session: 0 min  Stress: No Stress Concern Present (05/05/2021)   [place]    Feeling of Stress : Not at all  Social Connections: Socially Isolated (05/05/2021)   Social Connection and Isolation Panel [NHANES]    Frequency of Communication with Friends and Family: Never    Frequency of Social Gatherings with Friends and Family: Once  a week    Attends Religious Services: Never    Active Member of Clubs or Organizations: No    Attends Archivist Meetings: Never    Marital Status: Married  Human resources officer Violence: Not At Risk (05/05/2021)   Humiliation, Afraid, Rape, and Kick questionnaire    Fear of Current or Ex-Partner: No    Emotionally Abused: No    Physically Abused: No    Sexually Abused: No    Review of Systems  All other systems reviewed and are negative.      Objective:   Physical Exam   Physical exam could not be performed as the patient was seen via telephone     Assessment & Plan:  Cerebrovascular accident (CVA) due to embolism of cerebral artery (HCC)  Bedbound  Acute pulmonary embolism, unspecified pulmonary embolism type, unspecified whether acute cor pulmonale present [facility]) Patient's wife is aware that the patient is dying.  I anticipate that his life expectancy is 1 to 2 weeks depending upon how much he eats and drinks.  At the present time she is very grateful for hospice.  I plan to go out to their house today after work see her and the patient to see how she is doing to provide emotional support.  Unfortunately we have maximized everything that we can do medically at this point.

## 2022-05-10 ENCOUNTER — Telehealth: Payer: Self-pay

## 2022-05-10 NOTE — Telephone Encounter (Signed)
Pt's wife came into office stating that pt has a decub on his backside. Pt's wife provided a picture of this wound showing the purple bruising. Pt's wife wanted to know if pcp would be able to send in a stronger cream to help with the healing of the decub on pt's backside. Placed picture of pt bruise in nurse's folder.  Cb#: [phone]

## 2022-05-11 ENCOUNTER — Ambulatory Visit: Payer: Self-pay

## 2022-05-11 ENCOUNTER — Ambulatory Visit: Payer: PPO

## 2022-05-17 ENCOUNTER — Ambulatory Visit (INDEPENDENT_AMBULATORY_CARE_PROVIDER_SITE_OTHER)

## 2022-05-17 VITALS — Ht 72.0 in | Wt 197.0 lb

## 2022-05-17 DIAGNOSIS — Z0000 Encounter for general adult medical examination without abnormal findings: Secondary | ICD-10-CM

## 2022-05-17 NOTE — Patient Instructions (Signed)
Bernard Ramirez , Thank you for taking time to come for your Medicare Wellness Visit. I appreciate your ongoing commitment to your health goals. Please review the following plan we discussed and let me know if I can assist you in the future.   These are the goals we discussed:  Goals      Care Plan:     CARE PLAN ENTRY  Current Barriers:  Chronic Disease Management support, education, and care coordination needs related to Hypertension, Hyperlipidemia, and Heart Failure   Hypertension Pharmacist Clinical Goal(s): Over the next 90 days, patient will work with PharmD and providers to maintain BP goal <140/90 Current regimen:  Losartan '100mg'$  daily Interventions: Continue current therapy. Patient self care activities - Over the next 90 days, patient will: Check BP daily, document, and provide at future appointments Ensure daily salt intake < 2300 mg/day Report consistent readings > 140/90 to PharmD or PCP  Hyperlipidemia Pharmacist Clinical Goal(s): Over the next 90 days, patient will work with PharmD and providers to maintain LDL goal < 100 Current regimen:  Pravastatin '20mg'$  daily Interventions: Continue current therapy Patient self care activities - Over the next 90 days, patient will: Continue to take medication as directed.   Heart Failure Pharmacist Clinical Goal(s): Over the next 90 days, patient will work with PharmD and providers to maintain optimal fluid status to prevent exacerbations. Current regimen: furosemide '40mg'$  two tablets in the morning and one tablet in the evening Interventions Comprehensive medication review performed. Continue current medication management strategy Patient self care activities - Over the next 90 days, patient will: Focus on medication adherence by using a pull box Take medications as prescribed Report any increase in swelling to PharmD or providers.  Initial goal documentation      Have 3 meals a day     Eat healthy meals         This is a list of the screening recommended for you and due dates:  Health Maintenance  Topic Date Due   Zoster (Shingles) Vaccine (1 of 2) 07/27/2022*   COVID-19 Vaccine (4 - Pfizer risk series) 08/20/2022*   Flu Shot  11/19/2022*   Tetanus Vaccine  01/25/2031   Pneumonia Vaccine  Completed   HPV Vaccine  Aged Out  *Topic was postponed. The date shown is not the original due date.    Advanced directives: COPY IN CHART  Conditions/risks identified: Aim for a healthy diet and maintain fluid intake.  Next appointment: Follow up in one year for your annual wellness visit. 2024.  Preventive Care 5 Years and Older, Male  Preventive care refers to lifestyle choices and visits with your health care provider that can promote health and wellness. What does preventive care include? A yearly physical exam. This is also called an annual well check. Dental exams once or twice a year. Routine eye exams. Ask your health care provider how often you should have your eyes checked. Personal lifestyle choices, including: Daily care of your teeth and gums. Regular physical activity. Eating a healthy diet. Avoiding tobacco and drug use. Limiting alcohol use. Practicing safe sex. Taking low doses of aspirin every day. Taking vitamin and mineral supplements as recommended by your health care provider. What happens during an annual well check? The services and screenings done by your health care provider during your annual well check will depend on your age, overall health, lifestyle risk factors, and family history of disease. Counseling  Your health care provider may ask you questions about your: Alcohol  use. Tobacco use. Drug use. Emotional well-being. Home and relationship well-being. Sexual activity. Eating habits. History of falls. Memory and ability to understand (cognition). Work and work Statistician. Screening  You may have the following tests or measurements: Height, weight, and  BMI. Blood pressure. Lipid and cholesterol levels. These may be checked every 5 years, or more frequently if you are over 31 years old. Skin check. Lung cancer screening. You may have this screening every year starting at age 108 if you have a 30-pack-year history of smoking and currently smoke or have quit within the past 15 years. Fecal occult blood test (FOBT) of the stool. You may have this test every year starting at age 66. Flexible sigmoidoscopy or colonoscopy. You may have a sigmoidoscopy every 5 years or a colonoscopy every 10 years starting at age 6. Prostate cancer screening. Recommendations will vary depending on your family history and other risks. Hepatitis C blood test. Hepatitis B blood test. Sexually transmitted disease (STD) testing. Diabetes screening. This is done by checking your blood sugar (glucose) after you have not eaten for a while (fasting). You may have this done every 1-3 years. Abdominal aortic aneurysm (AAA) screening. You may need this if you are a current or former smoker. Osteoporosis. You may be screened starting at age 80 if you are at high risk. Talk with your health care provider about your test results, treatment options, and if necessary, the need for more tests. Vaccines  Your health care provider may recommend certain vaccines, such as: Influenza vaccine. This is recommended every year. Tetanus, diphtheria, and acellular pertussis (Tdap, Td) vaccine. You may need a Td booster every 10 years. Zoster vaccine. You may need this after age 51. Pneumococcal 13-valent conjugate (PCV13) vaccine. One dose is recommended after age 73. Pneumococcal polysaccharide (PPSV23) vaccine. One dose is recommended after age 55. Talk to your health care provider about which screenings and vaccines you need and how often you need them. This information is not intended to replace advice given to you by your health care provider. Make sure you discuss any questions you have  with your health care provider. Document Released: 09/03/2015 Document Revised: 04/26/2016 Document Reviewed: 06/08/2015 Elsevier Interactive Patient Education  2017 [place] Prevention in the Home Falls can cause injuries. They can happen to people of all ages. There are many things you can do to make your home safe and to help prevent falls. What can I do on the outside of my home? Regularly fix the edges of walkways and driveways and fix any cracks. Remove anything that might make you trip as you walk through a door, such as a raised step or threshold. Trim any bushes or trees on the path to your home. Use bright outdoor lighting. Clear any walking paths of anything that might make someone trip, such as rocks or tools. Regularly check to see if handrails are loose or broken. Make sure that both sides of any steps have handrails. Any raised decks and porches should have guardrails on the edges. Have any leaves, snow, or ice cleared regularly. Use sand or salt on walking paths during winter. Clean up any spills in your garage right away. This includes oil or grease spills. What can I do in the bathroom? Use night lights. Install grab bars by the toilet and in the tub and shower. Do not use towel bars as grab bars. Use non-skid mats or decals in the tub or shower. If you need to sit down  in the shower, use a plastic, non-slip stool. Keep the floor dry. Clean up any water that spills on the floor as soon as it happens. Remove soap buildup in the tub or shower regularly. Attach bath mats securely with double-sided non-slip rug tape. Do not have throw rugs and other things on the floor that can make you trip. What can I do in the bedroom? Use night lights. Make sure that you have a light by your bed that is easy to reach. Do not use any sheets or blankets that are too big for your bed. They should not hang down onto the floor. Have a firm chair that has side arms. You can use  this for support while you get dressed. Do not have throw rugs and other things on the floor that can make you trip. What can I do in the kitchen? Clean up any spills right away. Avoid walking on wet floors. Keep items that you use a lot in easy-to-reach places. If you need to reach something above you, use a strong step stool that has a grab bar. Keep electrical cords out of the way. Do not use floor polish or wax that makes floors slippery. If you must use wax, use non-skid floor wax. Do not have throw rugs and other things on the floor that can make you trip. What can I do with my stairs? Do not leave any items on the stairs. Make sure that there are handrails on both sides of the stairs and use them. Fix handrails that are broken or loose. Make sure that handrails are as long as the stairways. Check any carpeting to make sure that it is firmly attached to the stairs. Fix any carpet that is loose or worn. Avoid having throw rugs at the top or bottom of the stairs. If you do have throw rugs, attach them to the floor with carpet tape. Make sure that you have a light switch at the top of the stairs and the bottom of the stairs. If you do not have them, ask someone to add them for you. What else can I do to help prevent falls? Wear shoes that: Do not have high heels. Have rubber bottoms. Are comfortable and fit you well. Are closed at the toe. Do not wear sandals. If you use a stepladder: Make sure that it is fully opened. Do not climb a closed stepladder. Make sure that both sides of the stepladder are locked into place. Ask someone to hold it for you, if possible. Clearly mark and make sure that you can see: Any grab bars or handrails. First and last steps. Where the edge of each step is. Use tools that help you move around (mobility aids) if they are needed. These include: Canes. Walkers. Scooters. Crutches. Turn on the lights when you go into a dark area. Replace any light bulbs  as soon as they burn out. Set up your furniture so you have a clear path. Avoid moving your furniture around. If any of your floors are uneven, fix them. If there are any pets around you, be aware of where they are. Review your medicines with your doctor. Some medicines can make you feel dizzy. This can increase your chance of falling. Ask your doctor what other things that you can do to help prevent falls. This information is not intended to replace advice given to you by your health care provider. Make sure you discuss any questions you have with your health care provider. Document  Released: 06/03/2009 Document Revised: 01/13/2016 Document Reviewed: 09/11/2014 Elsevier Interactive Patient Education  2017 [employer].

## 2022-05-17 NOTE — Progress Notes (Cosign Needed Addendum)
Subjective:   Bernard Ramirez is a 88 y.o. male who presents for Medicare Annual/Subsequent preventive examination. Virtual Visit via Telephone Note  I connected with  Bernard Ramirez on 05/17/22 at  2:10 PM EDT by telephone and verified that I am speaking with the correct person using two identifiers.  Location: Patient: HOME Provider: [facility] Persons participating in the virtual visit: patient/Nurse Health Advisor   I discussed the limitations, risks, security and privacy concerns of performing an evaluation and management service by telephone and the availability of in person appointments. The patient expressed understanding and agreed to proceed.  Interactive audio and video telecommunications were attempted between this nurse and patient, however failed, due to patient having technical difficulties OR patient did not have access to video capability.  We continued and completed visit with audio only.  Some vital signs may be absent or patient reported.   Chriss Driver, LPN  Review of Systems     Cardiac Risk Factors include: advanced age (>67mn, >>20women);dyslipidemia;hypertension;male gender;sedentary lifestyle     Objective:    Today's Vitals   05/17/22 1428  Weight: 197 lb (89.4 kg)  Height: 6' (1.829 m)   Body mass index is 26.72 kg/m.     05/17/2022    2:32 PM 04/17/2022   12:33 PM 04/16/2022    2:00 PM 01/20/2022    8:08 AM 11/22/2021   11:33 PM 11/22/2021    8:42 AM 06/16/2021    1:44 PM  Advanced Directives  Does Patient Have a Medical Advance Directive? Yes  No Yes No No Unable to assess, patient is non-responsive or altered mental status  Type of AScientist, forensicPower of ARoosevelt GardensLiving will   Out of facility DNR (pink MOST or yellow form)     Does patient want to make changes to medical advance directive? No - Patient declined   No - Patient declined     Copy of [place]? Yes - validated most recent copy scanned  in chart (See row information)        Would patient like information on creating a medical advance directive?  No - Patient declined   No - Patient declined    Pre-existing out of facility DNR order (yellow form or pink MOST form)    Pink MOST/Yellow Form most recent copy in chart - Physician notified to receive inpatient order       Current Medications (verified) Outpatient Encounter Medications as of 05/17/2022  Medication Sig   apixaban (ELIQUIS) 5 MG TABS tablet Take 2 tablets by mouth twice a day for 7 days (until 04/24/2022); then start taking 1 tablet by mouth twice a day moving forward (starting date 04/25/2022).   haloperidol (HALDOL) 2 MG/ML solution Take 0.3 mLs (0.6 mg total) by mouth every 4 (four) hours as needed for agitation (and/or nausea).   LORazepam (LORAZEPAM INTENSOL) 2 MG/ML concentrated solution Take 0.3 mLs (0.6 mg total) by mouth every 4 (four) hours as needed for anxiety.   morphine (ROXANOL) 20 MG/ML concentrated solution Take 0.25 mLs (5 mg total) by mouth every 2 (two) hours as needed for severe pain, shortness of breath or moderate pain. May give sublingually if needed.   pantoprazole (PROTONIX) 40 MG tablet Take 1 tablet (40 mg total) by mouth daily.   No facility-administered encounter medications on file as of 05/17/2022.    Allergies (verified) Contrast media [iodinated contrast media], Gabapentin, Aspirin, Latex, Lipitor [atorvastatin], and Tape  History: Past Medical History:  Diagnosis Date   AKI (acute kidney injury) ([place]) 10/08/2016   Anemia    04/2012: H&H-10.2/31.5, MCV-77; 06/2012:12/38.9   Arthritis    Benign prostatic hypertrophy    s/p transurethral resection of the prostate   Cellulitis 02/01/2021   Chest pain    2004; with dyspnea, stress nuclear-normal EF + questionable inferior wall ischemia, normal coronary angiography;  2010-negative stress nuclear   CVA (cerebral infarction)    asymptomatic (seen on CT)   Diarrhea    h/o  Hemoccult-positive stool   Dysrhythmia    Edema of both legs    GERD (gastroesophageal reflux disease)    History of blood transfusion    History of kidney stones    History of urinary tract infection    Hypertension    Jerking movements of extremities    left arm    Low back pain    Obstructive sleep apnea    pt states can not use the CPAP   Peripheral neuropathy    lower legs and feet bilat    Persistent atrial fibrillation ([place]) 2013   Asymptomatic; diagnosed in 05/2012   Pneumonia    Prostate cancer ([place]) 2017   stage 4   Shortness of breath dyspnea    pt states only develops SOB if tries to do something too quickly   Upper GI bleed 1985   1985-peptic ulcer disease; initial hemigastrectomy; subsequent Billroth II   Urinary hesitancy    Vertigo    ED evaluation-06/2012   Past Surgical History:  Procedure Laterality Date   Billroth II     CHOLECYSTECTOMY     COLONOSCOPY  Approximately 2000   COLONOSCOPY  10/2012   Colonoscopy March 2014 at [facility], difficulty exam requiring fluoroscopy and overtube. Patient developed severe bradycardia again during his procedure like he did [facility]. Multiple polyps removed which were tubular adenomas. Previously tattooed sites at 20 and 30 cm were free of any polypoid change. Left-sided diverticulosis noted.   COLONOSCOPY WITH ESOPHAGOGASTRODUODENOSCOPY (EGD)  06-17-2009   BUL:AGTXMIWO tubular adenomas and 2 tubulovillous adenomas, incomplete, 3 clips placed   ESOPHAGOGASTRODUODENOSCOPY  06/17/2009   SLF: normal/chronic gastritis (non-h pylori)   ESOPHAGOGASTRODUODENOSCOPY N/A 05/11/2015   EHO:ZYYQMGN anemai due to postgastrectomy state/moderate erosive gastritis   INGUINAL HERNIA REPAIR     Left   ROTATOR CUFF REPAIR  1991   Bilateral   TRANSURETHRAL RESECTION OF PROSTATE  06/2016   TRANSURETHRAL RESECTION OF PROSTATE N/A 12/27/2012   Procedure: TRANSURETHRAL RESECTION OF THE PROSTATE (TURP);  Surgeon: Marissa Nestle, MD;   Location: [facility];  Service: Urology;  Laterality: N/A;   TRANSURETHRAL RESECTION OF PROSTATE N/A 04/11/2016   Procedure: TRANSURETHRAL RESECTION OF THE PROSTATE (TURP);  Surgeon: Irine Seal, MD;  Location: [facility];  Service: Urology;  Laterality: N/A;   VAGOTOMY AND PYLOROPLASTY  1970s   Details of procedure uncertain; 0037C   Family History  Problem Relation Age of Onset   Diabetes Mellitus II Mother    Allergic rhinitis Mother    Lung disease Father    Allergic rhinitis Father    Angioedema Neg Hx    Asthma Neg Hx    Atopy Neg Hx    Eczema Neg Hx    Immunodeficiency Neg Hx    Urticaria Neg Hx    Cystic fibrosis Neg Hx    Lupus Neg Hx    Emphysema Neg Hx    Migraines Neg Hx    Social History  Socioeconomic History   Marital status: Married    Spouse name: [name]   Number of children: 5   Years of education: Not on file   Highest education level: Not on file  Occupational History   Occupation: Retired Pensions consultant: RETIRED  Tobacco Use   Smoking status: Former    Types: Cigars    Quit date: 08/22/2003    Years since quitting: 18.7   Smokeless tobacco: Never   Tobacco comments:    Quit many years ago; few cigars per day; didn't inhale  Vaping Use   Vaping Use: Never used  Substance and Sexual Activity   Alcohol use: No    Alcohol/week: 1.0 standard drink of alcohol    Types: 1 Standard drinks or equivalent per week   Drug use: No   Sexual activity: Not on file  Other Topics Concern   Not on file  Social History Narrative   Lives w/ wife in [place]   Retired truck Geophysicist/field seismologist   Social Determinants of Health   Financial Resource Strain: [place]  (05/17/2022)   Overall Financial Resource Strain (CARDIA)    Difficulty of Paying Living Expenses: Not hard at all  Food Insecurity: No Food Insecurity (05/17/2022)   Hunger Vital Sign    Worried About Running Out of Food in the Last Year: Never true    [place] in the Last Year: Never true   Transportation Needs: No Transportation Needs (05/17/2022)   PRAPARE - Hydrologist (Medical): No    Lack of Transportation (Non-Medical): No  Physical Activity: Inactive (05/17/2022)   Exercise Vital Sign    Days of Exercise per Week: 0 days    Minutes of Exercise per Session: 0 min  Stress: No Stress Concern Present (05/17/2022)   [place]    Feeling of Stress : Not at all  Social Connections: Moderately Isolated (05/17/2022)   Social Connection and Isolation Panel [NHANES]    Frequency of Communication with Friends and Family: More than three times a week    Frequency of Social Gatherings with Friends and Family: More than three times a week    Attends Religious Services: Never    Marine scientist or Organizations: No    Attends Music therapist: Never    Marital Status: Married    Tobacco Counseling Counseling given: Not Answered Tobacco comments: Quit many years ago; few cigars per day; didn't inhale   Clinical Intake:  Pre-visit preparation completed: Yes  Pain : No/denies pain     BMI - recorded: 26.72 Nutritional Status: BMI 25 -29 Overweight Nutritional Risks: Other (Comment) Diabetes: No  How often do you need to have someone help you when you read instructions, pamphlets, or other written materials from your doctor or pharmacy?: 1 - Never  Diabetic?NO  Interpreter Needed?: No  Information entered by :: mj Lakara Weiland, lpn   Activities of Daily Living    05/17/2022    2:32 PM 04/17/2022   12:00 PM  In your present state of health, do you have any difficulty performing the following activities:  Hearing? 1 1  Vision? 1 1  Comment Glasses. [employer]   Difficulty concentrating or making decisions? 1 1  Walking or climbing stairs? 1 1  Dressing or bathing? 1 1  Doing errands, shopping? 1 1  Preparing Food and eating ? Y   Using the  Toilet?  Y   In the past six months, have you accidently leaked urine? Y   Do you have problems with loss of bowel control? Y   Managing your Medications? Y   Managing your Finances? Y   Housekeeping or managing your Housekeeping? Y     Patient Care Team: [name], MD as PCP - General (Family Medicine) Rothbart, Cristopher Estimable, MD (Inactive) as Attending Physician (Cardiology) Danie Binder, MD (Inactive) as Attending Physician (Gastroenterology)  Indicate any recent Medical Services you may have received from other than [facility] providers in the past year (date may be approximate).     Assessment:   This is a routine wellness examination for Bernard Ramirez.  Hearing/Vision screen No results found.  Dietary issues and exercise activities discussed: Current Exercise Habits: The patient does not participate in regular exercise at present, Exercise limited by: cardiac condition(s);respiratory conditions(s)   Goals Addressed   None    Depression Screen    05/17/2022    2:30 PM 05/05/2021    2:13 PM 09/13/2020   12:07 PM 12/09/2019    2:34 PM  PHQ 2/9 Scores  PHQ - 2 Score 0 0 0 0    Fall Risk    05/17/2022    2:32 PM 05/05/2021    2:19 PM 02/28/2021   12:00 PM 02/01/2021    3:53 PM 09/13/2020   12:07 PM  Fall Risk   Falls in the past year? 0 '1 1 1 '$ 0  Number falls in past yr: 0 0 0 0 0  Injury with Fall? 0 1 0 1 0  Risk for fall due to : Other (Comment) Impaired balance/gait;Impaired mobility;Impaired vision;History of fall(s)  History of fall(s)   Risk for fall due to: Comment Bed bound      Follow up Falls prevention discussed Falls prevention discussed  Falls evaluation completed Falls evaluation completed    [place]:  Any stairs in or around the home? Yes  If so, are there any without handrails? No  Home free of loose throw rugs in walkways, pet beds, electrical cords, etc? Yes  Adequate lighting in your home to reduce risk of falls? Yes    ASSISTIVE DEVICES UTILIZED TO PREVENT FALLS:  Life alert? No  Use of a cane, walker or w/c? Yes  Grab bars in the bathroom? Yes  Shower chair or bench in shower? Yes  Elevated toilet seat or a handicapped toilet? Yes   TIMED UP AND GO:  Was the test performed? No .  Phone visit  Cognitive Function:      01/31/2022   10:55 AM  Montreal Cognitive Assessment   Visuospatial/ Executive (0/5) 0  Naming (0/3) 0  Attention: Read list of digits (0/2) 2  Attention: Read list of letters (0/1) 0  Attention: Serial 7 subtraction starting at 100 (0/3) 0  Language: Repeat phrase (0/2) 1  Language : Fluency (0/1) 0  Abstraction (0/2) 0  Delayed Recall (0/5) 0  Orientation (0/6) 2  Total 5  Adjusted Score (based on education) 6      Immunizations Immunization History  Administered Date(s) Administered   Fluad Quad(high Dose 65+) 06/10/2020, 06/14/2021   Influenza Split 05/23/2016, 06/06/2017   Influenza, High Dose Seasonal PF 06/13/2018, 04/22/2019   Influenza-Unspecified 06/06/2017, 06/13/2018   PFIZER(Purple Top)SARS-COV-2 Vaccination 09/24/2019, 10/18/2019, 07/26/2020   Pneumococcal Conjugate-13 02/12/2014, 06/13/2015   Pneumococcal Polysaccharide-23 07/04/2016   Tdap 02/12/2014, 12/28/2019, 01/24/2021    TDAP status: Up to date  Flu Vaccine status: Declined, Education has been provided regarding the importance of this vaccine but patient still declined. Advised may receive this vaccine at local pharmacy or Health Dept. Aware to provide a copy of the vaccination record if obtained from local pharmacy or Health Dept. Verbalized acceptance and understanding.  Pneumococcal vaccine status: Up to date  Covid-19 vaccine status: Declined, Education has been provided regarding the importance of this vaccine but patient still declined. Advised may receive this vaccine at local pharmacy or Health Dept.or vaccine clinic. Aware to provide a copy of the vaccination record if obtained  from local pharmacy or Health Dept. Verbalized acceptance and understanding.  Qualifies for Shingles Vaccine? Yes   Zostavax completed Yes   Shingrix Completed?: Yes  Screening Tests Health Maintenance  Topic Date Due   Zoster Vaccines- Shingrix (1 of 2) 07/27/2022 (Originally 01/24/1950)   COVID-19 Vaccine (4 - Pfizer risk series) 08/20/2022 (Originally 09/20/2020)   INFLUENZA VACCINE  11/19/2022 (Originally 03/21/2022)   TETANUS/TDAP  01/25/2031   Pneumonia Vaccine 75+ Years old  Completed   HPV VACCINES  Aged Out    Health Maintenance  There are no preventive care reminders to display for this patient.   Colorectal cancer screening: No longer required.   Lung Cancer Screening: (Low Dose CT Chest recommended if Age 11-80 years, 30 pack-year currently smoking OR have quit w/in 15years.) does not qualify.   Lung Cancer Screening Referral: N/A  Additional Screening:  Hepatitis C Screening: does not qualify; Completed N/A  Vision Screening: Recommended annual ophthalmology exams for early detection of glaucoma and other disorders of the eye. Is the patient up to date with their annual eye exam?  Yes  Who is the provider or what is the name of the office in which the patient attends annual eye exams? [facility] [place] If pt is not established with a provider, would they like to be referred to a provider to establish care? No .   Dental Screening: Recommended annual dental exams for proper oral hygiene  Community Resource Referral / Chronic Care Management: CRR required this visit?  No   CCM required this visit?  No      Plan:     I have personally reviewed and noted the following in the patient's chart:   Medical and social history Use of alcohol, tobacco or illicit drugs  Current medications and supplements including opioid prescriptions. Patient is not currently taking opioid prescriptions. Functional ability and status Nutritional status Physical activity Advanced  directives List of other physicians Hospitalizations, surgeries, and ER visits in previous 12 months Vitals Screenings to include cognitive, depression, and falls Referrals and appointments  In addition, I have reviewed and discussed with patient certain preventive protocols, quality metrics, and best practice recommendations. A written personalized care plan for preventive services as well as general preventive health recommendations were provided to patient.     Chriss Driver, LPN   5/79/0383   Nurse Notes: Pt currently on Hospice care and is bedridden.

## 2022-05-29 ENCOUNTER — Telehealth: Payer: Self-pay

## 2022-05-29 NOTE — Telephone Encounter (Signed)
Please advice  

## 2022-05-29 NOTE — Telephone Encounter (Signed)
Bernard Ramirez with [facility]. Called in stating that pt has a bedsore that seems to be getting worse. Would like to know if pcp would send in an antibiotic for this? Please advise   Cb#: Janett Billow with Hospice [phone]

## 2022-05-30 ENCOUNTER — Telehealth: Payer: Self-pay | Admitting: Family Medicine

## 2022-05-30 ENCOUNTER — Other Ambulatory Visit: Payer: Self-pay | Admitting: Family Medicine

## 2022-05-30 MED ORDER — CEPHALEXIN 500 MG PO CAPS: 500.0000 mg | ORAL_CAPSULE | Freq: Three times a day (TID) | ORAL | 0 refills | Status: DC

## 2022-05-30 NOTE — Telephone Encounter (Signed)
Wife called requesting for something for the infections from the bed sore. She states that he is under hospice care but would really like something called in. She states that she is able to put her finger in the hole of the bed sore to her 1st knuckle. They use [facility] on [address] if you could let her know something today.   CB# [phone]

## 2022-05-30 NOTE — Telephone Encounter (Signed)
Spoke w/Jennifer at [facility]. Rx-Keflex sent to pharmacy Dr. Dennard Schaumann.   Per Anderson Malta will let pt know.

## 2022-05-30 NOTE — Telephone Encounter (Signed)
Received call from [place] at [facility] to follow up on conversation she had with patisnt's wife Tonia G[place]t. Anderson Malta is requesting a call back to be notified of the provider's response to the wife's request for an antibiotic.   Please advise Anderson Malta at [phone].

## 2022-05-30 NOTE — Telephone Encounter (Signed)
Tried Contractor at [employer]. No answer, LVM for return call

## 2022-06-05 ENCOUNTER — Other Ambulatory Visit: Payer: Self-pay | Admitting: Family Medicine

## 2022-06-05 MED ORDER — MORPHINE SULFATE (CONCENTRATE) 20 MG/ML PO SOLN
5.0000 mg | ORAL | 0 refills | Status: DC | PRN
Start: 2022-06-05 — End: 2023-12-13

## 2022-06-12 NOTE — Telephone Encounter (Signed)
06/12/22 Appears that pt has transitioned to [place] FNP-C  01/24/22 January 24, 2022 Me to [facility] (Emergency Contact)   [facility]    01/24/22  1:18 PM Left message that I was calling to follow up with her regarding her husband.  Left number for call back.  Damaris Hippo FNP-C

## 2022-06-13 ENCOUNTER — Other Ambulatory Visit: Payer: Self-pay | Admitting: Family Medicine

## 2022-06-13 ENCOUNTER — Encounter: Payer: Self-pay | Admitting: Family Medicine

## 2022-06-13 ENCOUNTER — Telehealth: Payer: Self-pay

## 2022-06-13 DIAGNOSIS — L89304 Pressure ulcer of unspecified buttock, stage 4: Secondary | ICD-10-CM

## 2022-06-13 MED ORDER — AMOXICILLIN-POT CLAVULANATE 400-57 MG/5ML PO SUSR
10.0000 mL | Freq: Two times a day (BID) | ORAL | 0 refills | Status: DC
Start: 2022-06-13 — End: 2022-07-06

## 2022-06-13 NOTE — Telephone Encounter (Signed)
PHOTO OF BEDSORE: Pt's wife came by with a photo of her husband's bedsore on his bottom. Placed in your green folder. Thank you.

## 2022-06-13 NOTE — Progress Notes (Unsigned)
  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

## 2022-07-03 ENCOUNTER — Telehealth: Payer: Self-pay

## 2022-07-03 NOTE — Telephone Encounter (Signed)
Pt's wife called in to ask if dr would consider coming to pt's home to do a visit. Pt's wife is concerned about the sore on pt's backside and that it is not getting any better even after pt has taken all of the antibiotics. Please advise.  Cb#: [phone]

## 2022-07-06 ENCOUNTER — Other Ambulatory Visit: Payer: Self-pay | Admitting: Family Medicine

## 2022-07-06 MED ORDER — AMOXICILLIN-POT CLAVULANATE 400-57 MG/5ML PO SUSR: 10.0000 mL | Freq: Two times a day (BID) | ORAL | 0 refills | Status: DC

## 2022-08-09 ENCOUNTER — Telehealth: Payer: Self-pay

## 2022-08-09 NOTE — Telephone Encounter (Signed)
Pt's wife called in stating that pt has a really bad cough and is coughing up a white mucus. Pt's wife would like to know if pcp would call in a med to the pharmacy that would help please. Please advise.  Cb#: [phone]

## 2022-08-10 ENCOUNTER — Other Ambulatory Visit: Payer: Self-pay | Admitting: Family Medicine

## 2022-08-10 MED ORDER — AMOXICILLIN-POT CLAVULANATE 400-57 MG/5ML PO SUSR: 10.0000 mL | Freq: Two times a day (BID) | ORAL | 0 refills | Status: DC

## 2022-08-17 ENCOUNTER — Ambulatory Visit: Payer: Self-pay

## 2022-08-17 ENCOUNTER — Other Ambulatory Visit: Payer: Self-pay | Admitting: Family Medicine

## 2022-08-17 MED ORDER — HYDROCODONE BIT-HOMATROP MBR 5-1.5 MG/5ML PO SOLN: 5.0000 mL | Freq: Three times a day (TID) | ORAL | 0 refills | Status: DC | PRN

## 2022-08-17 NOTE — Telephone Encounter (Signed)
  Chief Complaint: porductive cough x 8 days white  thick phlegm Symptoms: wheezing that clears after coughing Frequency: 08/12/22 Pertinent Negatives: Patient denies chest pain or SOB, fever Disposition: '[]'$ ED /'[]'$ Urgent Care (no appt availability in office) / '[]'$ Appointment(In office/virtual)/ '[]'$  [place] [facility]/ '[]'$ Home Care/ '[]'$ Refused Recommended Disposition /'[]'$ [place] [facility]/ '[x]'$  Follow-up with PCP Additional Notes: asking for prescription for cough call to [place] per insurance, disp amount needs to be for 15 days minimum- pt is bed bound Answer Assessment - Initial Assessment Questions 1. ONSET: "When did the cough begin?"      08/12/22 2. SEVERITY: "How bad is the cough today?"      Deep cough and hacking  3. SPUTUM: "Describe the color of your sputum" (none, dry cough; clear, white, yellow, green)     White, thicker  4. HEMOPTYSIS: "Are you coughing up any blood?" If so ask: "How much?" (flecks, streaks, tablespoons, etc.)     no 5. DIFFICULTY BREATHING: "Are you having difficulty breathing?" If Yes, ask: "How bad is it?" (e.g., mild, moderate, severe)    - MILD: No SOB at rest, mild SOB with walking, speaks normally in sentences, can lie down, no retractions, pulse < 100.    - MODERATE: SOB at rest, SOB with minimal exertion and prefers to sit, cannot lie down flat, speaks in phrases, mild retractions, audible wheezing, pulse 100-120.    - SEVERE: Very SOB at rest, speaks in single words, struggling to breathe, sitting hunched forward, retractions, pulse > 120      No SOB sats 92 room air baseline 90-100 % 6. FEVER: "Do you have a fever?" If Yes, ask: "What is your temperature, how was it measured, and when did it start?"     No fever 7. CARDIAC HISTORY: "Do you have any history of heart disease?" (e.g., heart attack, congestive heart failure)      PE denies chest pain 8. LUNG HISTORY: "Do you have any history of lung disease?"  (e.g., pulmonary embolus,  asthma, emphysema)     OSA 9. PE RISK FACTORS: "Do you have a history of blood clots?" (or: recent major surgery, recent prolonged travel, bedridden)    H/o PE 10. OTHER SYMPTOMS: "Do you have any other symptoms?" (e.g., runny nose, wheezing, chest pain)       NS: 114/52 70 97.6 92 % 18   rule wheezing  11. PREGNANCY: "Is there any chance you are pregnant?" "When was your last menstrual period?"       N/a 12. TRAVEL: "Have you traveled out of the country in the last month?" (e.g., travel history, exposures)       N/a  Protocols used: Cough - Acute Productive-A-AH

## 2022-08-18 ENCOUNTER — Ambulatory Visit: Payer: PPO | Admitting: Family Medicine

## 2022-08-18 NOTE — Telephone Encounter (Signed)
Spoke w/wife this morning, told wife that Dr. Dennard Schaumann sent in cough med for pt sent to [facility].   Wife aware and nothing further.

## 2022-08-24 ENCOUNTER — Other Ambulatory Visit: Payer: Self-pay | Admitting: Family Medicine

## 2022-08-24 ENCOUNTER — Telehealth: Payer: Self-pay

## 2022-08-24 MED ORDER — AMOXICILLIN-POT CLAVULANATE 400-57 MG/5ML PO SUSR: 10.0000 mL | Freq: Two times a day (BID) | ORAL | 0 refills | Status: DC

## 2022-08-24 NOTE — Telephone Encounter (Signed)
Call from Santiago Glad, Therapist, sports for [facility]. Santiago Glad states pt's chest still sounds "junky." Pt is coughing and is using the Hycodan but cough is deep per Santiago Glad. VS are as follows: Temp 98.3, Sat 92%, RR 18, BP 107/54, HR 86. Thank you.

## 2022-08-30 ENCOUNTER — Encounter: Payer: Self-pay | Admitting: Family Medicine

## 2022-10-02 ENCOUNTER — Telehealth: Payer: Self-pay | Admitting: Family Medicine

## 2022-10-02 NOTE — Telephone Encounter (Signed)
Received call from patient's wife to report two issues; requesting medications for both.   Patient has puss coming out of Right eye; coming from inner corner closest to his nose. Sclera and outer perimeter are both red.  Patient also has sores on both legs above ankle bone which are painful. Neosporin not helping much.   Requesting antibiotic eye drops, and an antibiotic cream with a numbing agent for sores on both legs.  Pharmacy confirmed as  [id number] - [place], [address] [place], [place] [address] Phone: [phone]  Fax: [phone] DEA #: [id number]   Please advise at [phone].

## 2022-10-03 ENCOUNTER — Other Ambulatory Visit: Payer: Self-pay | Admitting: Family Medicine

## 2022-10-03 MED ORDER — SILVER SULFADIAZINE 1 % EX CREA: 1.0000 | TOPICAL_CREAM | Freq: Every day | CUTANEOUS | 0 refills | Status: DC

## 2022-10-03 MED ORDER — POLYMYXIN B-TRIMETHOPRIM 10000-0.1 UNIT/ML-% OP SOLN: 2.0000 [drp] | OPHTHALMIC | 0 refills | Status: DC

## 2022-10-10 ENCOUNTER — Telehealth: Payer: Self-pay | Admitting: Family Medicine

## 2022-10-10 ENCOUNTER — Other Ambulatory Visit: Payer: Self-pay | Admitting: Family Medicine

## 2022-10-10 MED ORDER — POLYMYXIN B-TRIMETHOPRIM 10000-0.1 UNIT/ML-% OP SOLN
2.0000 [drp] | OPHTHALMIC | 0 refills | Status: DC
Start: 2022-10-10 — End: 2023-12-13

## 2022-10-10 NOTE — Telephone Encounter (Signed)
Patient's wife called to report the infection has spread to the other eye. Requesting a refill of   trimethoprim-polymyxin b (POLYTRIM) ophthalmic solution   Pharmacy confirmed as  [id number] - [place], [address] [place], [place] [address] Phone: [phone]  Fax: [phone] DEA #: [id number]   Please advise when script sent in at [phone].

## 2022-10-19 ENCOUNTER — Encounter: Payer: Self-pay | Admitting: Radiology

## 2022-10-30 IMAGING — MR MR HEAD W/O CM
12 of 14 series · 25 of 48 positions shown · non-contrast
Comparison: Head CT 06/16/2021.  MRI 10/15/2015.

CLINICAL DATA: Dizziness, persistent/recurrent, cardiac or vascular
cause suspected.

EXAM:
MRI HEAD WITHOUT CONTRAST
MRA HEAD WITHOUT CONTRAST
TECHNIQUE: Multiplanar, multi-echo pulse sequences of the brain and surrounding
structures were acquired without intravenous contrast. Angiographic
images of the Circle of Willis were acquired using MRA technique
without intravenous contrast.

[Series 5: DWI · axial · 4.0mm · 0.88mm/px · z∈[-15,+114]mm · 2 of 36 slices shown (1 of 6)]
[im 1/36]
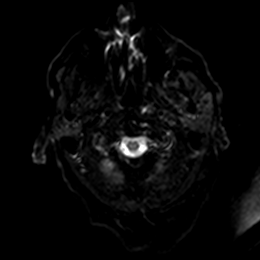
[im 36/36]
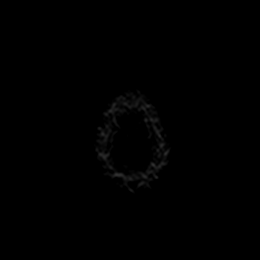

[Series 5: DWI · axial · 4.0mm · 0.88mm/px · z∈[-15,+114]mm · 2 of 36 slices shown (2 of 6)]
[im 1/36]
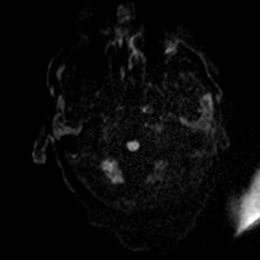
[im 36/36]
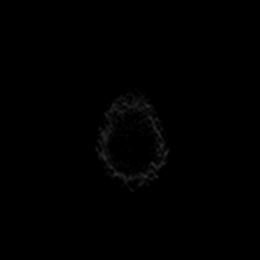

[Series 6: DWI · axial · 4.0mm · 0.88mm/px · z∈[-15,+114]mm · 3 of 36 slices shown (3 of 6)]
[im 1/36]
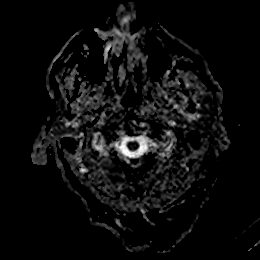
[im 18/36]
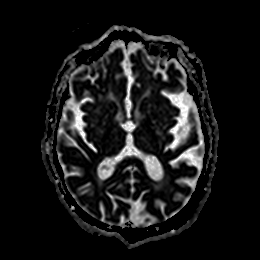
[im 36/36]
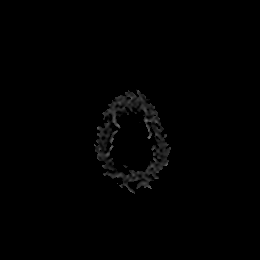

[Series 7: DWI · coronal · 5.0mm · 0.88mm/px · 2 of 28 slices shown (4 of 6)]
[im 1/28]
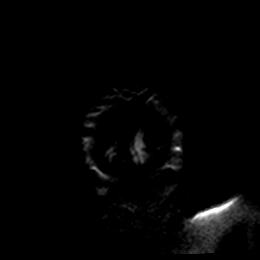
[im 28/28]
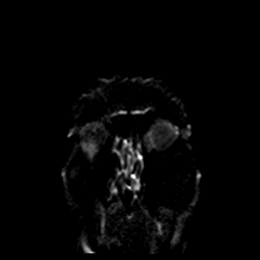

[Series 7: DWI · coronal · 5.0mm · 0.88mm/px · 2 of 28 slices shown (5 of 6)]
[im 1/28]
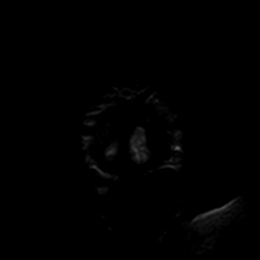
[im 28/28]
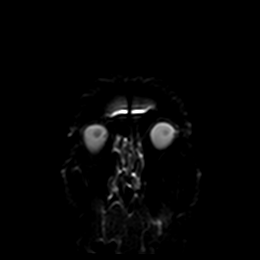

[Series 8: DWI · coronal · 5.0mm · 0.88mm/px · 2 of 28 slices shown (6 of 6)]
[im 1/28]
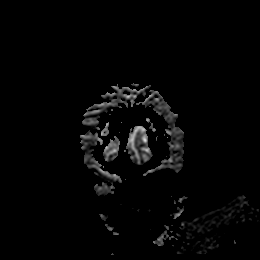
[im 28/28]
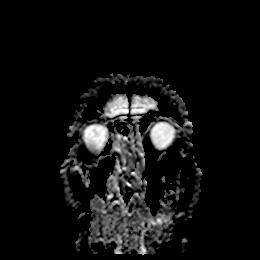

[Series 9: T1 · sagittal · 5.0mm · 0.94mm/px · 2 of 21 slices shown]
[im 1/21]
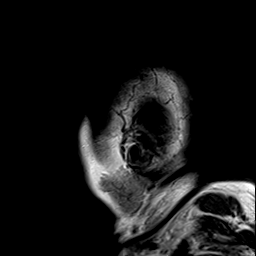
[im 21/21]
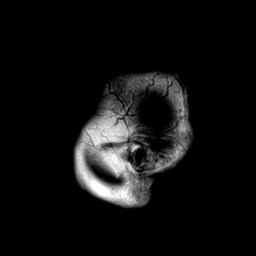

[Series 15: T2 · axial · 5.0mm · 0.72mm/px · z∈[-12,+111]mm · 2 of 20 slices shown (1 of 2)]
[im 1/20]
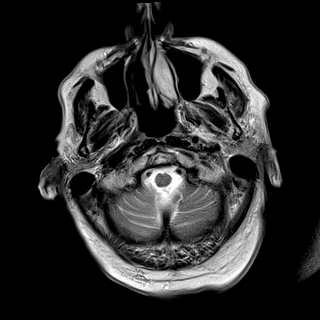
[im 20/20]
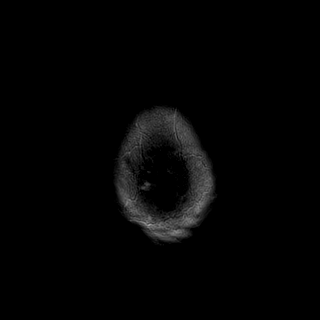

[Series 16: ax hemo · axial · 5.0mm · 0.86mm/px · z∈[-24,+109]mm · 2 of 25 slices shown]
[im 1/25]
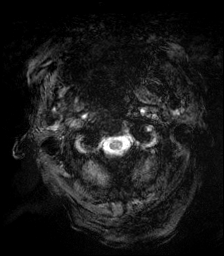
[im 25/25]
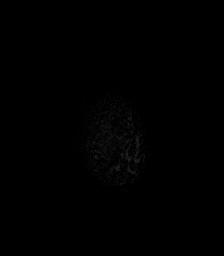

[Series 17: FLAIR · axial · 4.0mm · 0.43mm/px · z∈[-26,+111]mm · 3 of 38 slices shown]
[im 1/38]
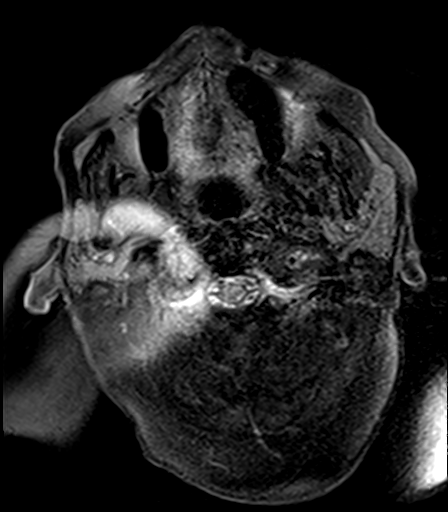
[im 19/38]
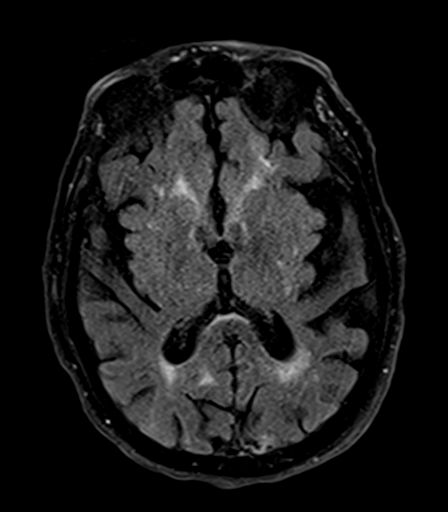
[im 38/38]
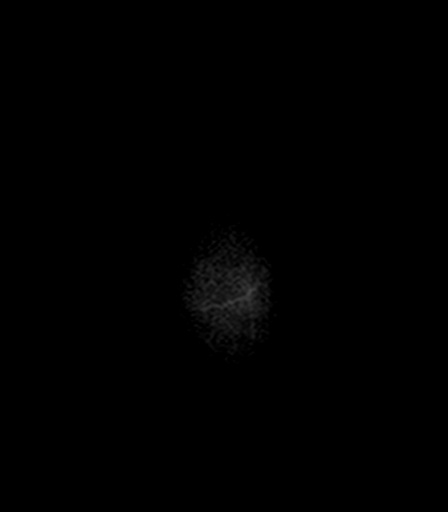

[Series 19: T2 · coronal · 5.0mm · 0.72mm/px · 2 of 28 slices shown (2 of 2)]
[im 1/28]
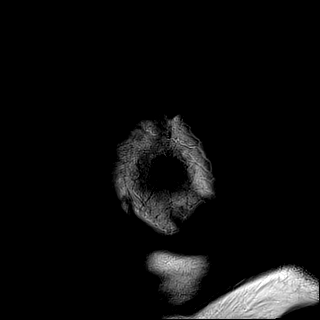
[im 28/28]
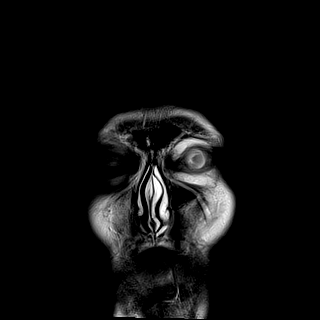

[Series 1017: tumble · axial · 0.5mm · 0.34mm/px · 1 of 2 slices shown]
[im 1/2]
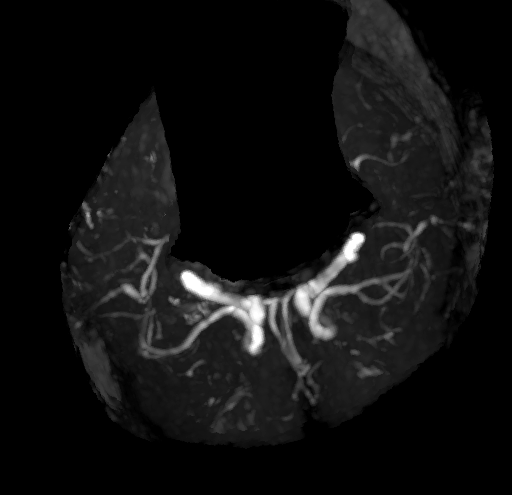

[25 of 48 positions shown; findings below may reference images not displayed]

FINDINGS: MRI HEAD FINDINGS

Brain: Diffusion imaging shows numerous scattered acute infarctions
within the inferior to mid cerebellum on the right. No mass effect
or visible hemorrhage. Cerebral hemispheres show old infarctions in
both occipital regions, the inferior left frontal lobe, and
extensively throughout the cerebral hemispheric white matter. No
mass lesion, hemorrhage, hydrocephalus or extra-axial collection.

Vascular: Major vessels at the base of the brain show flow.

Skull and upper cervical spine: Negative

Sinuses/Orbits: Clear/normal

Other: None

MRA HEAD FINDINGS

Anterior circulation: Both internal carotid arteries are patent
through the skull base and siphon regions without stenosis. The
anterior and middle cerebral vessels are patent. No proximal
stenosis, aneurysm or vascular malformation.

Posterior circulation: Both vertebral arteries widely patent to the
basilar. No visible right PICA flow. No basilar stenosis. Other
posterior circulation branch vessels show flow presently. Distal
branch vessels not evaluated by this technique.

Anatomic variants: None significant.
IMPRESSION: Scattered acute infarctions within the inferior to mid cerebellum on
the right consistent with infarction in the right PICA territory. No
visible PICA flow on the right by MR angiography, though resolution
is limited. No evidence of hemorrhage or significant mass effect.

Old infarctions in both occipital lobes and in the inferior left
frontal lobe. Extensive chronic small-vessel ischemic changes
throughout the cerebral hemispheric white matter.

## 2022-11-01 ENCOUNTER — Telehealth: Payer: Self-pay | Admitting: Family Medicine

## 2022-11-01 NOTE — Telephone Encounter (Signed)
Prescription Request  11/01/2022  LOV: 04/27/2022  What is the name of the medication or equipment?   silver sulfADIAZINE (SILVADENE) 1 % cream  **REQUESTING A LARGER QUANTITY; USING CREAM ON HIS BUTT AND LEGS**  Have you contacted your pharmacy to request a refill? Yes   Which pharmacy would you like this sent to?  [id number] - [place], [address] [place] [address] Phone: [phone] Fax: [phone]    Patient notified that their request is being sent to the clinical staff for review and that they should receive a response within 2 business days.   Please advise Mrs. Ballard at [phone] when script sent in.

## 2022-11-03 ENCOUNTER — Other Ambulatory Visit: Payer: Self-pay | Admitting: Family Medicine

## 2022-11-03 MED ORDER — SILVER SULFADIAZINE 1 % EX CREA
TOPICAL_CREAM | CUTANEOUS | 0 refills | Status: DC
Start: 2022-11-03 — End: 2022-11-16

## 2022-11-03 NOTE — Telephone Encounter (Signed)
Patient's wife isn't concerned about the price and is requesting for the cream to be refilled.   Please advise when script sent in at [phone].

## 2022-11-16 ENCOUNTER — Telehealth: Payer: Self-pay | Admitting: Family Medicine

## 2022-11-16 ENCOUNTER — Other Ambulatory Visit: Payer: Self-pay | Admitting: Family Medicine

## 2022-11-16 MED ORDER — SILVER SULFADIAZINE 1 % EX CREA
TOPICAL_CREAM | CUTANEOUS | 0 refills | Status: DC
Start: 2022-11-16 — End: 2023-01-23

## 2022-11-16 NOTE — Telephone Encounter (Signed)
Prescription Request  11/16/2022  LOV: 04/27/2022  What is the name of the medication or equipment?   silver sulfADIAZINE (SILVADENE) 1 % cream  **REQUESTING A LARGER JAR;USING CREAM ON HIS LEGS AND SORE ON HIS BUTTOCK.**  Have you contacted your pharmacy to request a refill? Yes   Which pharmacy would you like this sent to?  [id number] - [place], [address] [place] [address] Phone: [phone] Fax: [phone]    Patient notified that their request is being sent to the clinical staff for review and that they should receive a response within 2 business days.   Please advise patient's wife when refill sent at [phone].

## 2022-11-21 ENCOUNTER — Telehealth: Payer: Self-pay

## 2022-11-21 ENCOUNTER — Other Ambulatory Visit: Payer: Self-pay | Admitting: Family Medicine

## 2022-11-21 MED ORDER — HYDROCODONE BIT-HOMATROP MBR 5-1.5 MG/5ML PO SOLN: 5.0000 mL | Freq: Three times a day (TID) | ORAL | 0 refills | Status: DC | PRN

## 2022-11-21 NOTE — Telephone Encounter (Signed)
Pt's wife called in stating that pt is still having issues with coughing up a lot of white phlegm. Pt's wife wanted to know if pcp would call in a refill of cough med that pt has had before. Please advise.  Cb#: [phone]

## 2022-12-01 ENCOUNTER — Telehealth: Payer: Self-pay | Admitting: Family Medicine

## 2022-12-01 NOTE — Telephone Encounter (Signed)
Contacted Bernard Ramirez to schedule their annual wellness visit. Appointment made for 12/07/2022.  Thank you,  Judeth Cornfield,  AMB Clinical Support [facility] AWV Program Direct Dial ??[phone]

## 2022-12-07 ENCOUNTER — Ambulatory Visit: Payer: PPO

## 2022-12-07 VITALS — Ht 72.0 in | Wt 197.0 lb

## 2022-12-07 DIAGNOSIS — Z0000 Encounter for general adult medical examination without abnormal findings: Secondary | ICD-10-CM

## 2022-12-07 NOTE — Progress Notes (Signed)
Subjective:   Bernard Ramirez is a 89 y.o. male who presents for Medicare Annual/Subsequent preventive examination.  I connected with  Shandy C Derflinger on 12/07/22 by a audio enabled telemedicine application and verified that I am speaking with the correct person using two identifiers.  Patient Location: Home  Provider Location: Office/Clinic  I discussed the limitations of evaluation and management by telemedicine. The patient expressed understanding and agreed to proceed.  Review of Systems     Cardiac Risk Factors include: advanced age (>25men, >29 women);dyslipidemia;male gender;hypertension;sedentary lifestyle     Objective:    Today's Vitals   12/07/22 1301  Weight: 197 lb (89.4 kg)  Height: 6' (1.829 m)   Body mass index is 26.72 kg/m.     12/07/2022    1:27 PM 05/17/2022    2:32 PM 04/17/2022   12:33 PM 04/16/2022    2:00 PM 01/20/2022    8:08 AM 11/22/2021   11:33 PM 11/22/2021    8:42 AM  Advanced Directives  Does Patient Have a Medical Advance Directive? Yes Yes  No Yes No No  Type of Estate agent of [place];Living will Healthcare Power of [place];Living will   Out of facility DNR (pink MOST or yellow form)    Does patient want to make changes to medical advance directive? No - Patient declined No - Patient declined   No - Patient declined    Copy of Healthcare Power of Attorney in Chart? Yes - validated most recent copy scanned in chart (See row information) Yes - validated most recent copy scanned in chart (See row information)       Would patient like information on creating a medical advance directive?   No - Patient declined   No - Patient declined   Pre-existing out of facility DNR order (yellow form or pink MOST form)     Pink MOST/Yellow Form most recent copy in chart - Physician notified to receive inpatient order      Current Medications (verified) Outpatient Encounter Medications as of 12/07/2022  Medication Sig   apixaban  (ELIQUIS) 5 MG TABS tablet Take 2 tablets by mouth twice a day for 7 days (until 04/24/2022); then start taking 1 tablet by mouth twice a day moving forward (starting date 04/25/2022).   haloperidol (HALDOL) 2 MG/ML solution Take 0.3 mLs (0.6 mg total) by mouth every 4 (four) hours as needed for agitation (and/or nausea).   HYDROcodone bit-homatropine (HYCODAN) 5-1.5 MG/5ML syrup Take 5 mLs by mouth every 8 (eight) hours as needed for cough.   LORazepam (LORAZEPAM INTENSOL) 2 MG/ML concentrated solution Take 0.3 mLs (0.6 mg total) by mouth every 4 (four) hours as needed for anxiety.   morphine (ROXANOL) 20 MG/ML concentrated solution Take 0.25 mLs (5 mg total) by mouth every 2 (two) hours as needed for severe pain, shortness of breath or moderate pain. May give sublingually if needed.   pantoprazole (PROTONIX) 40 MG tablet Take 1 tablet (40 mg total) by mouth daily.   silver sulfADIAZINE (SSD) 1 % cream APPLY TOPICALLY TO THE AFFECTED AREA DAILY   trimethoprim-polymyxin b (POLYTRIM) ophthalmic solution Place 2 drops into the left eye every 4 (four) hours.   [DISCONTINUED] amoxicillin-clavulanate (AUGMENTIN) 400-57 MG/5ML suspension Take 10 mLs by mouth 2 (two) times daily.   No facility-administered encounter medications on file as of 12/07/2022.    Allergies (verified) Contrast media [iodinated contrast media], Gabapentin, Aspirin, Latex, Lipitor [atorvastatin], and Tape   History: Past Medical History:  Diagnosis Date   AKI (acute kidney injury) 10/08/2016   Anemia    04/2012: H&H-10.2/31.5, MCV-77; 06/2012:12/38.9   Arthritis    Benign prostatic hypertrophy    s/p transurethral resection of the prostate   Cellulitis 02/01/2021   Chest pain    2004; with dyspnea, stress nuclear-normal EF + questionable inferior wall ischemia, normal coronary angiography;  2010-negative stress nuclear   CVA (cerebral infarction)    asymptomatic (seen on CT)   Diarrhea    h/o Hemoccult-positive stool    Dysrhythmia    Edema of both legs    GERD (gastroesophageal reflux disease)    History of blood transfusion    History of kidney stones    History of urinary tract infection    Hypertension    Jerking movements of extremities    left arm    Low back pain    Obstructive sleep apnea    pt states can not use the CPAP   Peripheral neuropathy    lower legs and feet bilat    Persistent atrial fibrillation 2013   Asymptomatic; diagnosed in 05/2012   Pneumonia    Prostate cancer 2017   stage 4   Shortness of breath dyspnea    pt states only develops SOB if tries to do something too quickly   Upper GI bleed 1985   1985-peptic ulcer disease; initial hemigastrectomy; subsequent Billroth II   Urinary hesitancy    Vertigo    ED evaluation-06/2012   Past Surgical History:  Procedure Laterality Date   Billroth II     CHOLECYSTECTOMY     COLONOSCOPY  Approximately 2000   COLONOSCOPY  10/2012   Colonoscopy March 2014 at [facility], difficulty exam requiring fluoroscopy and overtube. Patient developed severe bradycardia again during his procedure like he did [facility]. Multiple polyps removed which were tubular adenomas. Previously tattooed sites at 20 and 30 cm were free of any polypoid change. Left-sided diverticulosis noted.   COLONOSCOPY WITH ESOPHAGOGASTRODUODENOSCOPY (EGD)  06-17-2009   [id number] tubular adenomas and 2 tubulovillous adenomas, incomplete, 3 clips placed   ESOPHAGOGASTRODUODENOSCOPY  06/17/2009   SLF: normal/chronic gastritis (non-h pylori)   ESOPHAGOGASTRODUODENOSCOPY N/A 05/11/2015   [id number] anemai due to postgastrectomy state/moderate erosive gastritis   INGUINAL HERNIA REPAIR     Left   ROTATOR CUFF REPAIR  1991   Bilateral   TRANSURETHRAL RESECTION OF PROSTATE  06/2016   TRANSURETHRAL RESECTION OF PROSTATE N/A 12/27/2012   Procedure: TRANSURETHRAL RESECTION OF THE PROSTATE (TURP);  Surgeon: Ky Barban, MD;  Location: [facility];  Service: Urology;   Laterality: N/A;   TRANSURETHRAL RESECTION OF PROSTATE N/A 04/11/2016   Procedure: TRANSURETHRAL RESECTION OF THE PROSTATE (TURP);  Surgeon: Bjorn Pippin, MD;  Location: [facility];  Service: Urology;  Laterality: N/A;   VAGOTOMY AND PYLOROPLASTY  1970s   Details of procedure uncertain; 1970s   Family History  Problem Relation Age of Onset   Diabetes Mellitus II Mother    Allergic rhinitis Mother    Lung disease Father    Allergic rhinitis Father    Angioedema Neg Hx    Asthma Neg Hx    Atopy Neg Hx    Eczema Neg Hx    Immunodeficiency Neg Hx    Urticaria Neg Hx    Cystic fibrosis Neg Hx    Lupus Neg Hx    Emphysema Neg Hx    Migraines Neg Hx    Social History   Socioeconomic History   Marital status: Married  Spouse name: [name]   Number of children: 5   Years of education: Not on file   Highest education level: Not on file  Occupational History   Occupation: Retired Advertising copywriter: RETIRED  Tobacco Use   Smoking status: Former    Types: Cigars    Quit date: 08/22/2003    Years since quitting: 19.3   Smokeless tobacco: Never   Tobacco comments:    Quit many years ago; few cigars per day; didn't inhale  Vaping Use   Vaping Use: Never used  Substance and Sexual Activity   Alcohol use: No    Alcohol/week: 1.0 standard drink of alcohol    Types: 1 Standard drinks or equivalent per week   Drug use: No   Sexual activity: Not on file  Other Topics Concern   Not on file  Social History Narrative   Lives w/ wife in [place]   Retired truck driver   Total care; primary caregiver wife    Bed bound    Social Determinants of Health   Financial Resource Strain: Low Risk  (12/07/2022)   Overall Financial Resource Strain (CARDIA)    Difficulty of Paying Living Expenses: Not hard at all  Food Insecurity: No Food Insecurity (12/07/2022)   Hunger Vital Sign    Worried About Running Out of Food in the Last Year: Never true    Ran Out of Food in the Last Year: Never  true  Transportation Needs: No Transportation Needs (12/07/2022)   PRAPARE - Administrator, Civil Service (Medical): No    Lack of Transportation (Non-Medical): No  Physical Activity: Inactive (12/07/2022)   Exercise Vital Sign    Days of Exercise per Week: 0 days    Minutes of Exercise per Session: 0 min  Stress: No Stress Concern Present (12/07/2022)   [employer] of Occupational Health - Occupational Stress Questionnaire    Feeling of Stress : Not at all  Social Connections: Moderately Isolated (12/07/2022)   Social Connection and Isolation Panel [NHANES]    Frequency of Communication with Friends and Family: More than three times a week    Frequency of Social Gatherings with Friends and Family: More than three times a week    Attends Religious Services: Never    Database administrator or Organizations: No    Attends Engineer, structural: Never    Marital Status: Married    Tobacco Counseling Counseling given: Not Answered Tobacco comments: Quit many years ago; few cigars per day; didn't inhale   Clinical Intake:  Pre-visit preparation completed: Yes  Pain : No/denies pain  Diabetes: No  How often do you need to have someone help you when you read instructions, pamphlets, or other written materials from your doctor or pharmacy?: 5 - Always  Diabetic?No   Interpreter Needed?: No  Comments: Assisted with visit by wife Information entered by :: Kandis Fantasia LPN   Activities of Daily Living    12/07/2022    1:27 PM 05/17/2022    2:32 PM  In your present state of health, do you have any difficulty performing the following activities:  Hearing? 0 1  Vision? 0 1  Comment  Glasses. [employer] [place]  Difficulty concentrating or making decisions? 1 1  Walking or climbing stairs? 1 1  Dressing or bathing? 1 1  Doing errands, shopping? 1 1  Preparing Food and eating ? Y Y  Using the Toilet? Y Y  In the past six  months, have you accidently  leaked urine? Y Y  Do you have problems with loss of bowel control? Y Y  Managing your Medications? Y Y  Managing your Finances? Malvin Johns  Housekeeping or managing your Housekeeping? Malvin Johns    Patient Care Team: Donita Brooks, MD as PCP - General (Family Medicine) Rothbart, Gerrit Friends, MD (Inactive) as Attending Physician (Cardiology) West Bali, MD (Inactive) as Attending Physician (Gastroenterology)  Indicate any recent Medical Services you may have received from other than [facility] providers in the past year (date may be approximate).     Assessment:   This is a routine wellness examination for Bernard Ramirez.  Hearing/Vision screen Hearing Screening - Comments:: Hard of hearing  Vision Screening - Comments:: No vision problems; wears rx glasses   Dietary issues and exercise activities discussed: Current Exercise Habits: The patient does not participate in regular exercise at present, Exercise limited by: neurologic condition(s)   Goals Addressed             This Visit's Progress    COMPLETED: Care Plan:       CARE PLAN ENTRY  Current Barriers:  Chronic Disease Management support, education, and care coordination needs related to Hypertension, Hyperlipidemia, and Heart Failure   Hypertension Pharmacist Clinical Goal(s): Over the next 90 days, patient will work with PharmD and providers to maintain BP goal <140/90 Current regimen:  Losartan  daily Interventions: Continue current therapy. Patient self care activities - Over the next 90 days, patient will: Check BP daily, document, and provide at future appointments Ensure daily salt intake < 2300 mg/day Report consistent readings > 140/90 to PharmD or PCP  Hyperlipidemia Pharmacist Clinical Goal(s): Over the next 90 days, patient will work with PharmD and providers to maintain LDL goal < 100 Current regimen:  Pravastatin  daily Interventions: Continue current therapy Patient self care activities - Over the next  90 days, patient will: Continue to take medication as directed.   Heart Failure Pharmacist Clinical Goal(s): Over the next 90 days, patient will work with PharmD and providers to maintain optimal fluid status to prevent exacerbations. Current regimen: furosemide  two tablets in the morning and one tablet in the evening Interventions Comprehensive medication review performed. Continue current medication management strategy Patient self care activities - Over the next 90 days, patient will: Focus on medication adherence by using a pull box Take medications as prescribed Report any increase in swelling to PharmD or providers.  Initial goal documentation       Depression Screen    12/07/2022    1:22 PM 05/17/2022    2:30 PM 05/05/2021    2:13 PM 09/13/2020   12:07 PM 12/09/2019    2:34 PM  PHQ 2/9 Scores  PHQ - 2 Score 0 0 0 0 0    Fall Risk    12/07/2022    1:03 PM 05/17/2022    2:32 PM 05/05/2021    2:19 PM 02/28/2021   12:00 PM 02/01/2021    3:53 PM  Fall Risk   Falls in the past year?  0 Number falls in past yr: 0 0 0 0 0  Injury with Fall? 0 0 1 0 1  Risk for fall due to : No Fall Risks;Other (Comment) Other (Comment) Impaired balance/gait;Impaired mobility;Impaired vision;History of fall(s)  History of fall(s)  Risk for fall due to: Comment bedbound Bed bound     Follow up Falls prevention discussed;Education provided;Falls evaluation completed Falls prevention discussed  Falls prevention discussed  Falls evaluation completed    FALL RISK PREVENTION PERTAINING TO THE HOME:  Any stairs in or around the home? No  If so, are there any without handrails? No  Home free of loose throw rugs in walkways, pet beds, electrical cords, etc? Yes  Adequate lighting in your home to reduce risk of falls? Yes   ASSISTIVE DEVICES UTILIZED TO PREVENT FALLS:  Life alert? No  Use of a cane, walker or w/c? No  Grab bars in the bathroom? Yes  Shower chair or bench in shower?  Yes  Elevated toilet seat or a handicapped toilet? Yes   TIMED UP AND GO:  Was the test performed? No . Telephonic visit   Cognitive Function:      01/31/2022   10:55 AM  Montreal Cognitive Assessment   Visuospatial/ Executive (0/5) 0  Naming (0/3) 0  Attention: Read list of digits (0/2) 2  Attention: Read list of letters (0/1) 0  Attention: Serial 7 subtraction starting at 100 (0/3) 0  Language: Repeat phrase (0/2) 1  Language : Fluency (0/1) 0  Abstraction (0/2) 0  Delayed Recall (0/5) 0  Orientation (0/6) 2  Total 5  Adjusted Score (based on education) 6      Immunizations Immunization History  Administered Date(s) Administered   Fluad Quad(high Dose 65+) 06/10/2020, 06/14/2021   Influenza Split 05/23/2016, 06/06/2017   Influenza, High Dose Seasonal PF 06/13/2018, 04/22/2019   Influenza-Unspecified 06/06/2017, 06/13/2018   PFIZER(Purple Top)SARS-COV-2 Vaccination 09/24/2019, 10/18/2019, 07/26/2020   Pneumococcal Conjugate-13 02/12/2014, 06/13/2015   Pneumococcal Polysaccharide-23 07/04/2016   Tdap 02/12/2014, 12/28/2019, 01/24/2021    TDAP status: Up to date  Flu Vaccine status: Up to date  Pneumococcal vaccine status: Up to date  Covid-19 vaccine status: Information provided on how to obtain vaccines.   Qualifies for Shingles Vaccine? Yes   Zostavax completed No   Shingrix Completed?: No.    Education has been provided regarding the importance of this vaccine. Patient has been advised to call insurance company to determine out of pocket expense if they have not yet received this vaccine. Advised may also receive vaccine at local pharmacy or Health Dept. Verbalized acceptance and understanding.  Screening Tests Health Maintenance  Topic Date Due   Zoster Vaccines- Shingrix (1 of 2) Never done   COVID-19 Vaccine (4 - 2023-24 season) 04/21/2022   INFLUENZA VACCINE  03/22/2023   Medicare Annual Wellness (AWV)  12/07/2023   DTaP/Tdap/Td (4 - Td or Tdap)  01/25/2031   Pneumonia Vaccine 80+ Years old  Completed   HPV VACCINES  Aged Out    Health Maintenance  Health Maintenance Due  Topic Date Due   Zoster Vaccines- Shingrix (1 of 2) Never done   COVID-19 Vaccine (4 - 2023-24 season) 04/21/2022    Colorectal cancer screening: No longer required.   Lung Cancer Screening: (Low Dose CT Chest recommended if Age 55-80 years, 30 pack-year currently smoking OR have quit w/in 15years.) does not qualify.   Lung Cancer Screening Referral: n/a  Additional Screening:  Hepatitis C Screening: does not qualify;  Vision Screening: Recommended annual ophthalmology exams for early detection of glaucoma and other disorders of the eye. Is the patient up to date with their annual eye exam?  No  Who is the provider or what is the name of the office in which the patient attends annual eye exams? none If pt is not established with a provider, would they like to be referred to a provider to  establish care? No .   Dental Screening: Recommended annual dental exams for proper oral hygiene  Community Resource Referral / Chronic Care Management: CRR required this visit?  No   CCM required this visit?  No      Plan:     I have personally reviewed and noted the following in the patient's chart:   Medical and social history Use of alcohol, tobacco or illicit drugs  Current medications and supplements including opioid prescriptions. Patient is currently taking opioid prescriptions. Information provided to patient regarding non-opioid alternatives. Patient advised to discuss non-opioid treatment plan with their provider. Functional ability and status Nutritional status Physical activity Advanced directives List of other physicians Hospitalizations, surgeries, and ER visits in previous 12 months Vitals Screenings to include cognitive, depression, and falls Referrals and appointments  In addition, I have reviewed and discussed with patient certain  preventive protocols, quality metrics, and best practice recommendations. A written personalized care plan for preventive services as well as general preventive health recommendations were provided to patient.     Durwin Nora, [place]   7/82/9562   Due to this being a virtual visit, the after visit summary with patients personalized plan was offered to patient via mail or my-chart.  per request, patient was mailed a copy of AVS  Nurse Notes: No concerns

## 2022-12-07 NOTE — Patient Instructions (Signed)
Bernard Ramirez , Thank you for taking time to come for your Medicare Wellness Visit. I appreciate your ongoing commitment to your health goals. Please review the following plan we discussed and let me know if I can assist you in the future.   These are the goals we discussed:  Goals      Have 3 meals a day     Eat healthy meals        This is a list of the screening recommended for you and due dates:  Health Maintenance  Topic Date Due   Zoster (Shingles) Vaccine (1 of 2) Never done   COVID-19 Vaccine (4 - 2023-24 season) 04/21/2022   Flu Shot  03/22/2023   Medicare Annual Wellness Visit  12/07/2023   DTaP/Tdap/Td vaccine (4 - Td or Tdap) 01/25/2031   Pneumonia Vaccine  Completed   HPV Vaccine  Aged Out    Advanced directives: We have a copy of your advanced directives available in your record should your provider ever need to access them.   Conditions/risks identified: Aim for 6-8 glasses of water, and 5 servings of fruits and vegetables each day.   Next appointment: Follow up in one year for your annual wellness visit.   Preventive Care 6 Years and Older, Male  Preventive care refers to lifestyle choices and visits with your health care provider that can promote health and wellness. What does preventive care include? A yearly physical exam. This is also called an annual well check. Dental exams once or twice a year. Routine eye exams. Ask your health care provider how often you should have your eyes checked. Personal lifestyle choices, including: Daily care of your teeth and gums. Regular physical activity. Eating a healthy diet. Avoiding tobacco and drug use. Limiting alcohol use. Practicing safe sex. Taking low doses of aspirin every day. Taking vitamin and mineral supplements as recommended by your health care provider. What happens during an annual well check? The services and screenings done by your health care provider during your annual well check will depend on  your age, overall health, lifestyle risk factors, and family history of disease. Counseling  Your health care provider may ask you questions about your: Alcohol use. Tobacco use. Drug use. Emotional well-being. Home and relationship well-being. Sexual activity. Eating habits. History of falls. Memory and ability to understand (cognition). Work and work Astronomer. Screening  You may have the following tests or measurements: Height, weight, and BMI. Blood pressure. Lipid and cholesterol levels. These may be checked every 5 years, or more frequently if you are over 27 years old. Skin check. Lung cancer screening. You may have this screening every year starting at age 96 if you have a 30-pack-year history of smoking and currently smoke or have quit within the past 15 years. Fecal occult blood test (FOBT) of the stool. You may have this test every year starting at age 21. Flexible sigmoidoscopy or colonoscopy. You may have a sigmoidoscopy every 5 years or a colonoscopy every 10 years starting at age 40. Prostate cancer screening. Recommendations will vary depending on your family history and other risks. Hepatitis C blood test. Hepatitis B blood test. Sexually transmitted disease (STD) testing. Diabetes screening. This is done by checking your blood sugar (glucose) after you have not eaten for a while (fasting). You may have this done every 1-3 years. Abdominal aortic aneurysm (AAA) screening. You may need this if you are a current or former smoker. Osteoporosis. You may be screened starting at age  70 if you are at high risk. Talk with your health care provider about your test results, treatment options, and if necessary, the need for more tests. Vaccines  Your health care provider may recommend certain vaccines, such as: Influenza vaccine. This is recommended every year. Tetanus, diphtheria, and acellular pertussis (Tdap, Td) vaccine. You may need a Td booster every 10 years. Zoster  vaccine. You may need this after age 41. Pneumococcal 13-valent conjugate (PCV13) vaccine. One dose is recommended after age 82. Pneumococcal polysaccharide (PPSV23) vaccine. One dose is recommended after age 69. Talk to your health care provider about which screenings and vaccines you need and how often you need them. This information is not intended to replace advice given to you by your health care provider. Make sure you discuss any questions you have with your health care provider. Document Released: 09/03/2015 Document Revised: 04/26/2016 Document Reviewed: 06/08/2015 Elsevier Interactive Patient Education  2017 [place] Prevention in the Home Falls can cause injuries. They can happen to people of all ages. There are many things you can do to make your home safe and to help prevent falls. What can I do on the outside of my home? Regularly fix the edges of walkways and driveways and fix any cracks. Remove anything that might make you trip as you walk through a door, such as a raised step or threshold. Trim any bushes or trees on the path to your home. Use bright outdoor lighting. Clear any walking paths of anything that might make someone trip, such as rocks or tools. Regularly check to see if handrails are loose or broken. Make sure that both sides of any steps have handrails. Any raised decks and porches should have guardrails on the edges. Have any leaves, snow, or ice cleared regularly. Use sand or salt on walking paths during winter. Clean up any spills in your garage right away. This includes oil or grease spills. What can I do in the bathroom? Use night lights. Install grab bars by the toilet and in the tub and shower. Do not use towel bars as grab bars. Use non-skid mats or decals in the tub or shower. If you need to sit down in the shower, use a plastic, non-slip stool. Keep the floor dry. Clean up any water that spills on the floor as soon as it happens. Remove  soap buildup in the tub or shower regularly. Attach bath mats securely with double-sided non-slip rug tape. Do not have throw rugs and other things on the floor that can make you trip. What can I do in the bedroom? Use night lights. Make sure that you have a light by your bed that is easy to reach. Do not use any sheets or blankets that are too big for your bed. They should not hang down onto the floor. Have a firm chair that has side arms. You can use this for support while you get dressed. Do not have throw rugs and other things on the floor that can make you trip. What can I do in the kitchen? Clean up any spills right away. Avoid walking on wet floors. Keep items that you use a lot in easy-to-reach places. If you need to reach something above you, use a strong step stool that has a grab bar. Keep electrical cords out of the way. Do not use floor polish or wax that makes floors slippery. If you must use wax, use non-skid floor wax. Do not have throw rugs and other  things on the floor that can make you trip. What can I do with my stairs? Do not leave any items on the stairs. Make sure that there are handrails on both sides of the stairs and use them. Fix handrails that are broken or loose. Make sure that handrails are as long as the stairways. Check any carpeting to make sure that it is firmly attached to the stairs. Fix any carpet that is loose or worn. Avoid having throw rugs at the top or bottom of the stairs. If you do have throw rugs, attach them to the floor with carpet tape. Make sure that you have a light switch at the top of the stairs and the bottom of the stairs. If you do not have them, ask someone to add them for you. What else can I do to help prevent falls? Wear shoes that: Do not have high heels. Have rubber bottoms. Are comfortable and fit you well. Are closed at the toe. Do not wear sandals. If you use a stepladder: Make sure that it is fully opened. Do not climb a  closed stepladder. Make sure that both sides of the stepladder are locked into place. Ask someone to hold it for you, if possible. Clearly mark and make sure that you can see: Any grab bars or handrails. First and last steps. Where the edge of each step is. Use tools that help you move around (mobility aids) if they are needed. These include: Canes. Walkers. Scooters. Crutches. Turn on the lights when you go into a dark area. Replace any light bulbs as soon as they burn out. Set up your furniture so you have a clear path. Avoid moving your furniture around. If any of your floors are uneven, fix them. If there are any pets around you, be aware of where they are. Review your medicines with your doctor. Some medicines can make you feel dizzy. This can increase your chance of falling. Ask your doctor what other things that you can do to help prevent falls. This information is not intended to replace advice given to you by your health care provider. Make sure you discuss any questions you have with your health care provider. Document Released: 06/03/2009 Document Revised: 01/13/2016 Document Reviewed: 09/11/2014 Elsevier Interactive Patient Education  2017 [employer].

## 2023-01-22 ENCOUNTER — Telehealth: Payer: Self-pay

## 2023-01-22 NOTE — Telephone Encounter (Signed)
Pt's wife called in to ask for a refill of this med:  Prescription Request  01/22/2023  LOV: Visit date not found  What is the name of the medication or equipment? silver sulfADIAZINE (SSD) 1 % cream [[id number]]  Have you contacted your pharmacy to request a refill? No   Which pharmacy would you like this sent to?  [id number] - [place],  - [address] [place] [address] Phone: [phone] Fax: [phone]    Patient notified that their request is being sent to the clinical staff for review and that they should receive a response within 2 business days.   Please advise at Mobile [phone] (mobile)

## 2023-01-23 ENCOUNTER — Other Ambulatory Visit: Payer: Self-pay

## 2023-01-23 DIAGNOSIS — L89304 Pressure ulcer of unspecified buttock, stage 4: Secondary | ICD-10-CM

## 2023-01-23 MED ORDER — SILVER SULFADIAZINE 1 % EX CREA: TOPICAL_CREAM | CUTANEOUS | 1 refills | Status: DC

## 2023-02-02 ENCOUNTER — Telehealth: Payer: Self-pay | Admitting: Family Medicine

## 2023-02-02 ENCOUNTER — Other Ambulatory Visit: Payer: Self-pay | Admitting: Family Medicine

## 2023-02-02 MED ORDER — CLOTRIMAZOLE-BETAMETHASONE 1-0.05 % EX CREA: 1.0000 | TOPICAL_CREAM | Freq: Every day | CUTANEOUS | 0 refills | Status: DC

## 2023-02-02 NOTE — Telephone Encounter (Signed)
Wife called states that patients groin area is red and now has white spots in it. She states she has been using gold bond powder, neosporin, and silvadine and it isn't getting better.She would like to know what else she can try.  CB# [phone]

## 2023-02-15 ENCOUNTER — Telehealth: Payer: Self-pay

## 2023-02-15 NOTE — Telephone Encounter (Signed)
Pt's wife called in asking for a refill of HYDROcodone bit-homatropine (HYCODAN) 5-1.5 MG/5ML syrup.  PHARMACY: [id number] - [place], [place] - [address] AT [address], [place] [address] Phone: [phone]  Fax: [phone]  CB#: [phone]

## 2023-02-19 ENCOUNTER — Other Ambulatory Visit: Payer: Self-pay | Admitting: Family Medicine

## 2023-02-19 MED ORDER — HYDROCODONE BIT-HOMATROP MBR 5-1.5 MG/5ML PO SOLN: 5.0000 mL | Freq: Three times a day (TID) | ORAL | 0 refills | Status: DC | PRN

## 2023-05-08 ENCOUNTER — Other Ambulatory Visit: Payer: Self-pay | Admitting: Family Medicine

## 2023-05-08 DIAGNOSIS — L89304 Pressure ulcer of unspecified buttock, stage 4: Secondary | ICD-10-CM

## 2023-05-09 NOTE — Telephone Encounter (Signed)
Requested medication (s) are due for refill today: Yes  Requested medication (s) are on the active medication list: Yes  Last refill:  01/23/23   Future visit scheduled: Yes  Notes to clinic:  Unable to refill per protocol, medication not assigned to the refill protocol.      Requested Prescriptions  Pending Prescriptions Disp Refills   SSD 1 % cream [Pharmacy Med Name: SILVER SULFADIAZINE 1% CREAM 400GM] 400 g 1    Sig: APPLY TOPICALLY TO THE AFFECTED AREA DAILY     Off-Protocol Failed - 05/08/2023  2:24 PM      Failed - Medication not assigned to a protocol, review manually.      Failed - Valid encounter within last 12 months    Recent Outpatient Visits           1 year ago Systolic congestive heart failure, unspecified HF chronicity (HCC)   [facility] Donita Brooks, MD   1 year ago Systolic congestive heart failure, unspecified HF chronicity (HCC)   [facility] Donita Brooks, MD   1 year ago Systolic congestive heart failure, unspecified HF chronicity (HCC)   [facility] Pickard, Priscille Heidelberg, MD   1 year ago Neck mass   [facility] Donita Brooks, MD   1 year ago Controlled type 2 diabetes mellitus with complication, without long-term current use of insulin (HCC)   [facility] Pickard, Priscille Heidelberg, MD

## 2023-05-10 NOTE — Telephone Encounter (Signed)
Patient's wife called to follow up on refill requested for large jar of silver sulfADIAZINE (SSD) 1 % cream  Pharmacy needs new script.  Patient bed-ridden and unable to come in for an appointment.   LOV telephone call 04/27/2022  Pharmacy confirmed as:  [id number] - [place], [place] - [address] AT [address], [place] [address] Phone: [phone]  Fax: [phone] DEA #: [id number]   Please advise at [phone].

## 2023-05-11 ENCOUNTER — Other Ambulatory Visit: Payer: Self-pay

## 2023-05-11 DIAGNOSIS — L89304 Pressure ulcer of unspecified buttock, stage 4: Secondary | ICD-10-CM

## 2023-05-11 MED ORDER — SILVER SULFADIAZINE 1 % EX CREA: TOPICAL_CREAM | CUTANEOUS | 1 refills | Status: DC

## 2023-05-30 ENCOUNTER — Other Ambulatory Visit: Payer: Self-pay | Admitting: Family Medicine

## 2023-05-30 ENCOUNTER — Telehealth: Payer: Self-pay | Admitting: Family Medicine

## 2023-05-30 MED ORDER — HYDROCODONE BIT-HOMATROP MBR 5-1.5 MG/5ML PO SOLN: 5.0000 mL | Freq: Three times a day (TID) | ORAL | 0 refills | Status: DC | PRN

## 2023-05-30 NOTE — Telephone Encounter (Signed)
Prescription Request  05/30/2023  LOV: Visit date not found  What is the name of the medication or equipment? HYDROcodone bit-homatropine (HYCODAN) 5-1.5 MG/5ML syrup   Have you contacted your pharmacy to request a refill? Yes   Which pharmacy would you like this sent to?  [id number] - [place], [place] - [address] [place] [address] Phone: [phone] Fax: [phone]    Patient notified that their request is being sent to the clinical staff for review and that they should receive a response within 2 business days.   Please advise at [facility] [phone]

## 2023-09-06 ENCOUNTER — Other Ambulatory Visit: Payer: Self-pay | Admitting: Family Medicine

## 2023-09-06 MED ORDER — HYDROCODONE BIT-HOMATROP MBR 5-1.5 MG/5ML PO SOLN: 5.0000 mL | Freq: Three times a day (TID) | ORAL | 0 refills | Status: DC | PRN

## 2023-09-06 NOTE — Telephone Encounter (Signed)
Copied from CRM [id number]. Topic: Clinical - Medication Refill >> Sep 06, 2023 12:07 PM Gildardo Pounds wrote: Most Recent Primary Care Visit:  Provider: Anthoney Harada  Department: [facility]  Visit Type: MEDICARE AWV, SEQUENTIAL  Date: 12/07/2022  Medication: ***  Has the patient contacted their pharmacy?  (Agent: If no, request that the patient contact the pharmacy for the refill. If patient does not wish to contact the pharmacy document the reason why and proceed with request.) (Agent: If yes, when and what did the pharmacy advise?)  Is this the correct pharmacy for this prescription?  If no, delete pharmacy and type the correct one.  This is the patient's preferred pharmacy:  [facility] - [place], [place] - [address] [place] [address] Phone: [phone] Fax: [phone]  [facility] [phone] - [place], [place] - [address] AT [facility] [address] [place] [address] Phone: [phone] Fax: [phone]   Has the prescription been filled recently?   Is the patient out of the medication?   Has the patient been seen for an appointment in the last year OR does the patient have an upcoming appointment?   Can we respond through MyChart?   Agent: Please be advised that Rx refills may take up to 3 business days. We ask that you follow-up with your pharmacy.

## 2023-10-04 ENCOUNTER — Telehealth: Payer: Self-pay

## 2023-10-04 ENCOUNTER — Other Ambulatory Visit: Payer: Self-pay | Admitting: Family Medicine

## 2023-10-04 MED ORDER — HYDROCODONE BIT-HOMATROP MBR 5-1.5 MG/5ML PO SOLN
5.0000 mL | Freq: Three times a day (TID) | ORAL | 0 refills | Status: DC | PRN
Start: 2023-10-04 — End: 2023-12-13

## 2023-10-04 NOTE — Telephone Encounter (Signed)
Copied from CRM [id number]. Topic: Clinical - Medication Question >> Oct 03, 2023  5:05 PM Antony Haste wrote: Reason for CRM: PT's wife Bernard Ramirez is requesting an alternative to HYDROcodone bit-homatropine (HYCODAN) 5-1.5 MG/5ML syrup for Bernard Ramirez's cough. She is wanting this sent to the [facility].

## 2023-10-04 NOTE — Telephone Encounter (Signed)
Copied from CRM [id number]. Topic: Clinical - Prescription Issue >> Oct 04, 2023  4:44 PM Shelah Lewandowsky wrote: Reason for CRM: Sharyl Nimrod with [facility] calling about HYDROcodone bit-homatropine (HYCODAN) 5-1.5 MG/5ML syrup  is on back order with no release date will need an alternative

## 2023-10-15 ENCOUNTER — Telehealth: Payer: Self-pay

## 2023-10-17 ENCOUNTER — Telehealth: Payer: Self-pay

## 2023-10-17 NOTE — Telephone Encounter (Signed)
 Copied from CRM [id number]. Topic: General - Deceased Patient >> Oct 23, 2023  8:32 AM Dennison Nancy wrote: Name of caller: Bernard Ramirez (Wife)  Date of death: 10-21-2023   Name of funeral home: [facility] in [place] , visitation is tonight October 23, 2023 5-7 pm Erasmo Score will be tomorrow 2:00pm Would like Bernard Ramirez,Bernard Ramirez ,Bernard Ramirez and Dr. Lynnea Ferrier to know of Bernard Ramirez passing   Phone number of funeral home: [phone] Provider that needs to sign form: N/A  Timeline for signing: N/A

## 2023-10-20 NOTE — Telephone Encounter (Signed)
 Copied from CRM [id number]. Topic: General - Other >> 2023-11-03  9:35 AM Eunice Blase wrote: Reason for CRM: [facility] per Clydie Braun ph: [phone] stated Mr. Somera passed away 11-03-2023 at 9:05 a.m. EST.

## 2023-10-20 DEATH — deceased
